# Patient Record
Sex: Female | Born: 1940 | Race: Black or African American | Hispanic: No | Marital: Married | Smoking: Never smoker
Health system: Southern US, Community
[De-identification: ages and names within clinical notes are randomized; demographics above are authoritative.]

## PROBLEM LIST (undated history)

## (undated) DIAGNOSIS — R001 Bradycardia, unspecified: Secondary | ICD-10-CM

## (undated) DIAGNOSIS — I6529 Occlusion and stenosis of unspecified carotid artery: Secondary | ICD-10-CM

## (undated) DIAGNOSIS — T7840XA Allergy, unspecified, initial encounter: Secondary | ICD-10-CM

## (undated) DIAGNOSIS — D649 Anemia, unspecified: Secondary | ICD-10-CM

## (undated) DIAGNOSIS — M797 Fibromyalgia: Secondary | ICD-10-CM

## (undated) DIAGNOSIS — R011 Cardiac murmur, unspecified: Secondary | ICD-10-CM

## (undated) DIAGNOSIS — E119 Type 2 diabetes mellitus without complications: Secondary | ICD-10-CM

## (undated) DIAGNOSIS — F419 Anxiety disorder, unspecified: Secondary | ICD-10-CM

## (undated) DIAGNOSIS — I499 Cardiac arrhythmia, unspecified: Secondary | ICD-10-CM

## (undated) DIAGNOSIS — I8392 Asymptomatic varicose veins of left lower extremity: Secondary | ICD-10-CM

## (undated) DIAGNOSIS — R110 Nausea: Secondary | ICD-10-CM

## (undated) DIAGNOSIS — M549 Dorsalgia, unspecified: Secondary | ICD-10-CM

## (undated) DIAGNOSIS — K224 Dyskinesia of esophagus: Secondary | ICD-10-CM

## (undated) DIAGNOSIS — G8929 Other chronic pain: Secondary | ICD-10-CM

## (undated) DIAGNOSIS — R112 Nausea with vomiting, unspecified: Secondary | ICD-10-CM

## (undated) DIAGNOSIS — Z86010 Personal history of colonic polyps: Secondary | ICD-10-CM

## (undated) DIAGNOSIS — E785 Hyperlipidemia, unspecified: Secondary | ICD-10-CM

## (undated) DIAGNOSIS — K5790 Diverticulosis of intestine, part unspecified, without perforation or abscess without bleeding: Secondary | ICD-10-CM

## (undated) DIAGNOSIS — I10 Essential (primary) hypertension: Secondary | ICD-10-CM

## (undated) DIAGNOSIS — F32A Depression, unspecified: Secondary | ICD-10-CM

## (undated) DIAGNOSIS — G4730 Sleep apnea, unspecified: Secondary | ICD-10-CM

## (undated) DIAGNOSIS — K625 Hemorrhage of anus and rectum: Secondary | ICD-10-CM

## (undated) DIAGNOSIS — Z9889 Other specified postprocedural states: Secondary | ICD-10-CM

## (undated) DIAGNOSIS — D696 Thrombocytopenia, unspecified: Secondary | ICD-10-CM

## (undated) DIAGNOSIS — J45909 Unspecified asthma, uncomplicated: Secondary | ICD-10-CM

## (undated) DIAGNOSIS — R778 Other specified abnormalities of plasma proteins: Secondary | ICD-10-CM

## (undated) DIAGNOSIS — I639 Cerebral infarction, unspecified: Secondary | ICD-10-CM

## (undated) DIAGNOSIS — M1990 Unspecified osteoarthritis, unspecified site: Secondary | ICD-10-CM

## (undated) DIAGNOSIS — K219 Gastro-esophageal reflux disease without esophagitis: Secondary | ICD-10-CM

## (undated) DIAGNOSIS — K6289 Other specified diseases of anus and rectum: Secondary | ICD-10-CM

## (undated) DIAGNOSIS — K449 Diaphragmatic hernia without obstruction or gangrene: Secondary | ICD-10-CM

## (undated) DIAGNOSIS — F329 Major depressive disorder, single episode, unspecified: Secondary | ICD-10-CM

## (undated) HISTORY — DX: Occlusion and stenosis of unspecified carotid artery: I65.29

## (undated) HISTORY — DX: Cerebral infarction, unspecified: I63.9

## (undated) HISTORY — DX: Other chronic pain: G89.29

## (undated) HISTORY — DX: Depression, unspecified: F32.A

## (undated) HISTORY — DX: Cardiac murmur, unspecified: R01.1

## (undated) HISTORY — DX: Other specified abnormalities of plasma proteins: R77.8

## (undated) HISTORY — DX: Hypercalcemia: E83.52

## (undated) HISTORY — PX: KNEE SURGERY: SHX244

## (undated) HISTORY — DX: Dyskinesia of esophagus: K22.4

## (undated) HISTORY — PX: JOINT REPLACEMENT: SHX530

## (undated) HISTORY — DX: Nausea: R11.0

## (undated) HISTORY — PX: TUBAL LIGATION: SHX77

## (undated) HISTORY — DX: Thrombocytopenia, unspecified: D69.6

## (undated) HISTORY — DX: Allergy, unspecified, initial encounter: T78.40XA

## (undated) HISTORY — DX: Gastro-esophageal reflux disease without esophagitis: K21.9

## (undated) HISTORY — DX: Hyperlipidemia, unspecified: E78.5

## (undated) HISTORY — DX: Fibromyalgia: M79.7

## (undated) HISTORY — DX: Hemorrhage of anus and rectum: K62.5

## (undated) HISTORY — PX: EYE SURGERY: SHX253

## (undated) HISTORY — DX: Sleep apnea, unspecified: G47.30

## (undated) HISTORY — DX: Essential (primary) hypertension: I10

## (undated) HISTORY — PX: GLAUCOMA SURGERY: SHX656

## (undated) HISTORY — DX: Other specified diseases of anus and rectum: K62.89

## (undated) HISTORY — DX: Personal history of colonic polyps: Z86.010

## (undated) HISTORY — DX: Major depressive disorder, single episode, unspecified: F32.9

## (undated) HISTORY — DX: Unspecified osteoarthritis, unspecified site: M19.90

## (undated) HISTORY — DX: Anxiety disorder, unspecified: F41.9

## (undated) HISTORY — DX: Diverticulosis of intestine, part unspecified, without perforation or abscess without bleeding: K57.90

## (undated) HISTORY — PX: CATARACT EXTRACTION, BILATERAL: SHX1313

## (undated) HISTORY — DX: Dorsalgia, unspecified: M54.9

## (undated) HISTORY — DX: Diaphragmatic hernia without obstruction or gangrene: K44.9

---

## 1992-03-14 HISTORY — PX: CARDIAC CATHETERIZATION: SHX172

## 1998-06-02 ENCOUNTER — Other Ambulatory Visit: Admission: RE | Admit: 1998-06-02 | Discharge: 1998-06-02 | Payer: Self-pay | Admitting: Obstetrics and Gynecology

## 1998-07-04 ENCOUNTER — Other Ambulatory Visit: Admission: RE | Admit: 1998-07-04 | Discharge: 1998-07-04 | Payer: Self-pay | Admitting: Obstetrics and Gynecology

## 1998-10-10 ENCOUNTER — Ambulatory Visit (HOSPITAL_COMMUNITY): Admission: RE | Admit: 1998-10-10 | Discharge: 1998-10-10 | Payer: Self-pay | Admitting: Gastroenterology

## 1998-10-13 ENCOUNTER — Ambulatory Visit: Admission: RE | Admit: 1998-10-13 | Discharge: 1998-10-13 | Payer: Self-pay | Admitting: Otolaryngology

## 1998-10-15 HISTORY — PX: NISSEN FUNDOPLICATION: SHX2091

## 1998-10-17 ENCOUNTER — Ambulatory Visit (HOSPITAL_COMMUNITY): Admission: RE | Admit: 1998-10-17 | Discharge: 1998-10-17 | Payer: Self-pay | Admitting: Gastroenterology

## 1998-10-21 ENCOUNTER — Other Ambulatory Visit: Admission: RE | Admit: 1998-10-21 | Discharge: 1998-10-21 | Payer: Self-pay | Admitting: Obstetrics and Gynecology

## 1998-12-20 ENCOUNTER — Inpatient Hospital Stay (HOSPITAL_COMMUNITY): Admission: RE | Admit: 1998-12-20 | Discharge: 1998-12-22 | Payer: Self-pay | Admitting: Internal Medicine

## 1999-02-14 ENCOUNTER — Encounter: Payer: Self-pay | Admitting: Surgery

## 1999-02-14 ENCOUNTER — Ambulatory Visit (HOSPITAL_COMMUNITY): Admission: RE | Admit: 1999-02-14 | Discharge: 1999-02-14 | Payer: Self-pay | Admitting: Surgery

## 1999-11-22 ENCOUNTER — Other Ambulatory Visit: Admission: RE | Admit: 1999-11-22 | Discharge: 1999-11-22 | Payer: Self-pay | Admitting: Obstetrics and Gynecology

## 2001-03-14 ENCOUNTER — Other Ambulatory Visit: Admission: RE | Admit: 2001-03-14 | Discharge: 2001-03-14 | Payer: Self-pay | Admitting: Obstetrics and Gynecology

## 2001-07-04 ENCOUNTER — Ambulatory Visit (HOSPITAL_COMMUNITY): Admission: RE | Admit: 2001-07-04 | Discharge: 2001-07-04 | Payer: Self-pay | Admitting: Family Medicine

## 2001-10-15 DIAGNOSIS — K5790 Diverticulosis of intestine, part unspecified, without perforation or abscess without bleeding: Secondary | ICD-10-CM

## 2001-10-15 HISTORY — DX: Diverticulosis of intestine, part unspecified, without perforation or abscess without bleeding: K57.90

## 2002-01-14 ENCOUNTER — Ambulatory Visit (HOSPITAL_COMMUNITY): Admission: RE | Admit: 2002-01-14 | Discharge: 2002-01-14 | Payer: Self-pay | Admitting: Surgery

## 2002-01-14 ENCOUNTER — Encounter: Payer: Self-pay | Admitting: Surgery

## 2002-06-23 ENCOUNTER — Encounter: Payer: Self-pay | Admitting: Gastroenterology

## 2002-06-23 ENCOUNTER — Ambulatory Visit (HOSPITAL_COMMUNITY): Admission: RE | Admit: 2002-06-23 | Discharge: 2002-06-23 | Payer: Self-pay | Admitting: Gastroenterology

## 2002-06-24 ENCOUNTER — Encounter: Payer: Self-pay | Admitting: Nurse Practitioner

## 2002-07-02 ENCOUNTER — Encounter (INDEPENDENT_AMBULATORY_CARE_PROVIDER_SITE_OTHER): Payer: Self-pay

## 2002-07-02 ENCOUNTER — Encounter (INDEPENDENT_AMBULATORY_CARE_PROVIDER_SITE_OTHER): Payer: Self-pay | Admitting: *Deleted

## 2002-07-02 ENCOUNTER — Ambulatory Visit (HOSPITAL_COMMUNITY): Admission: RE | Admit: 2002-07-02 | Discharge: 2002-07-02 | Payer: Self-pay | Admitting: Gastroenterology

## 2002-07-02 DIAGNOSIS — Z860101 Personal history of adenomatous and serrated colon polyps: Secondary | ICD-10-CM

## 2002-07-02 DIAGNOSIS — Z86010 Personal history of colonic polyps: Secondary | ICD-10-CM

## 2002-07-02 HISTORY — DX: Personal history of colonic polyps: Z86.010

## 2002-07-02 HISTORY — DX: Personal history of adenomatous and serrated colon polyps: Z86.0101

## 2002-08-27 ENCOUNTER — Other Ambulatory Visit: Admission: RE | Admit: 2002-08-27 | Discharge: 2002-08-27 | Payer: Self-pay | Admitting: Obstetrics and Gynecology

## 2003-07-03 ENCOUNTER — Encounter: Payer: Self-pay | Admitting: Emergency Medicine

## 2003-07-03 ENCOUNTER — Inpatient Hospital Stay (HOSPITAL_COMMUNITY): Admission: EM | Admit: 2003-07-03 | Discharge: 2003-07-08 | Payer: Self-pay | Admitting: Emergency Medicine

## 2003-07-04 ENCOUNTER — Encounter: Payer: Self-pay | Admitting: Internal Medicine

## 2003-07-05 ENCOUNTER — Encounter (INDEPENDENT_AMBULATORY_CARE_PROVIDER_SITE_OTHER): Payer: Self-pay | Admitting: *Deleted

## 2003-07-07 ENCOUNTER — Encounter (INDEPENDENT_AMBULATORY_CARE_PROVIDER_SITE_OTHER): Payer: Self-pay | Admitting: *Deleted

## 2003-07-07 ENCOUNTER — Encounter: Payer: Self-pay | Admitting: Cardiology

## 2003-07-15 ENCOUNTER — Encounter: Payer: Self-pay | Admitting: Nurse Practitioner

## 2003-07-16 ENCOUNTER — Encounter: Admission: RE | Admit: 2003-07-16 | Discharge: 2003-07-16 | Payer: Self-pay | Admitting: Gastroenterology

## 2003-07-16 ENCOUNTER — Encounter: Payer: Self-pay | Admitting: Gastroenterology

## 2003-08-11 ENCOUNTER — Encounter: Payer: Self-pay | Admitting: Nurse Practitioner

## 2003-08-30 ENCOUNTER — Other Ambulatory Visit: Admission: RE | Admit: 2003-08-30 | Discharge: 2003-08-30 | Payer: Self-pay | Admitting: Obstetrics and Gynecology

## 2003-09-07 ENCOUNTER — Ambulatory Visit (HOSPITAL_COMMUNITY): Admission: RE | Admit: 2003-09-07 | Discharge: 2003-09-07 | Payer: Self-pay | Admitting: Gastroenterology

## 2003-09-07 ENCOUNTER — Encounter (INDEPENDENT_AMBULATORY_CARE_PROVIDER_SITE_OTHER): Payer: Self-pay | Admitting: *Deleted

## 2003-09-16 ENCOUNTER — Encounter: Payer: Self-pay | Admitting: Nurse Practitioner

## 2004-02-07 ENCOUNTER — Encounter: Payer: Self-pay | Admitting: Nurse Practitioner

## 2004-04-11 ENCOUNTER — Encounter: Payer: Self-pay | Admitting: Nurse Practitioner

## 2004-04-14 ENCOUNTER — Encounter: Payer: Self-pay | Admitting: Nurse Practitioner

## 2004-04-14 ENCOUNTER — Encounter: Admission: RE | Admit: 2004-04-14 | Discharge: 2004-04-14 | Payer: Self-pay | Admitting: Gastroenterology

## 2004-05-01 ENCOUNTER — Ambulatory Visit (HOSPITAL_COMMUNITY): Admission: RE | Admit: 2004-05-01 | Discharge: 2004-05-01 | Payer: Self-pay | Admitting: Gastroenterology

## 2004-05-01 ENCOUNTER — Encounter: Payer: Self-pay | Admitting: Nurse Practitioner

## 2004-06-05 ENCOUNTER — Encounter: Admission: RE | Admit: 2004-06-05 | Discharge: 2004-06-05 | Payer: Self-pay | Admitting: Gastroenterology

## 2004-06-05 ENCOUNTER — Encounter: Payer: Self-pay | Admitting: Nurse Practitioner

## 2005-01-16 ENCOUNTER — Encounter: Payer: Self-pay | Admitting: Emergency Medicine

## 2005-01-16 ENCOUNTER — Encounter (INDEPENDENT_AMBULATORY_CARE_PROVIDER_SITE_OTHER): Payer: Self-pay | Admitting: Cardiology

## 2005-01-16 ENCOUNTER — Inpatient Hospital Stay (HOSPITAL_COMMUNITY): Admission: AD | Admit: 2005-01-16 | Discharge: 2005-01-20 | Payer: Self-pay | Admitting: Internal Medicine

## 2005-01-17 ENCOUNTER — Encounter (INDEPENDENT_AMBULATORY_CARE_PROVIDER_SITE_OTHER): Payer: Self-pay | Admitting: *Deleted

## 2005-01-19 ENCOUNTER — Encounter (INDEPENDENT_AMBULATORY_CARE_PROVIDER_SITE_OTHER): Payer: Self-pay | Admitting: *Deleted

## 2006-06-02 ENCOUNTER — Emergency Department (HOSPITAL_COMMUNITY): Admission: EM | Admit: 2006-06-02 | Discharge: 2006-06-02 | Payer: Self-pay | Admitting: Emergency Medicine

## 2006-08-14 ENCOUNTER — Inpatient Hospital Stay (HOSPITAL_COMMUNITY): Admission: EM | Admit: 2006-08-14 | Discharge: 2006-08-17 | Payer: Self-pay | Admitting: Emergency Medicine

## 2006-08-14 ENCOUNTER — Encounter: Payer: Self-pay | Admitting: Cardiovascular Disease

## 2006-08-14 ENCOUNTER — Ambulatory Visit: Payer: Self-pay | Admitting: Cardiovascular Disease

## 2007-03-31 HISTORY — PX: OTHER SURGICAL HISTORY: SHX169

## 2007-10-16 HISTORY — PX: GASTRIC RESECTION: SHX5248

## 2008-05-05 ENCOUNTER — Encounter (INDEPENDENT_AMBULATORY_CARE_PROVIDER_SITE_OTHER): Payer: Self-pay | Admitting: *Deleted

## 2008-05-05 ENCOUNTER — Encounter: Admission: RE | Admit: 2008-05-05 | Discharge: 2008-05-05 | Payer: Self-pay | Admitting: Surgery

## 2008-06-07 ENCOUNTER — Ambulatory Visit (HOSPITAL_COMMUNITY): Admission: RE | Admit: 2008-06-07 | Discharge: 2008-06-07 | Payer: Self-pay | Admitting: Surgery

## 2008-06-07 ENCOUNTER — Encounter (INDEPENDENT_AMBULATORY_CARE_PROVIDER_SITE_OTHER): Payer: Self-pay | Admitting: *Deleted

## 2008-08-20 ENCOUNTER — Encounter (INDEPENDENT_AMBULATORY_CARE_PROVIDER_SITE_OTHER): Payer: Self-pay | Admitting: *Deleted

## 2008-08-20 ENCOUNTER — Inpatient Hospital Stay (HOSPITAL_COMMUNITY): Admission: RE | Admit: 2008-08-20 | Discharge: 2008-08-25 | Payer: Self-pay | Admitting: Surgery

## 2008-08-20 ENCOUNTER — Encounter (INDEPENDENT_AMBULATORY_CARE_PROVIDER_SITE_OTHER): Payer: Self-pay | Admitting: Surgery

## 2008-08-21 ENCOUNTER — Encounter (INDEPENDENT_AMBULATORY_CARE_PROVIDER_SITE_OTHER): Payer: Self-pay | Admitting: *Deleted

## 2008-12-27 ENCOUNTER — Encounter: Admission: RE | Admit: 2008-12-27 | Discharge: 2008-12-27 | Payer: Self-pay | Admitting: Family Medicine

## 2009-02-22 ENCOUNTER — Encounter: Payer: Self-pay | Admitting: Nurse Practitioner

## 2009-02-22 ENCOUNTER — Encounter: Payer: Self-pay | Admitting: Internal Medicine

## 2009-05-27 ENCOUNTER — Encounter: Payer: Self-pay | Admitting: Internal Medicine

## 2009-06-22 ENCOUNTER — Encounter: Payer: Self-pay | Admitting: Nurse Practitioner

## 2009-06-23 ENCOUNTER — Ambulatory Visit (HOSPITAL_COMMUNITY): Admission: RE | Admit: 2009-06-23 | Discharge: 2009-06-23 | Payer: Self-pay | Admitting: *Deleted

## 2009-07-11 ENCOUNTER — Encounter: Payer: Self-pay | Admitting: Internal Medicine

## 2009-08-07 ENCOUNTER — Emergency Department (HOSPITAL_COMMUNITY): Admission: EM | Admit: 2009-08-07 | Discharge: 2009-08-07 | Payer: Self-pay | Admitting: Emergency Medicine

## 2009-08-25 ENCOUNTER — Encounter: Payer: Self-pay | Admitting: Nurse Practitioner

## 2009-09-05 ENCOUNTER — Encounter: Payer: Self-pay | Admitting: Nurse Practitioner

## 2009-09-29 ENCOUNTER — Ambulatory Visit: Payer: Self-pay | Admitting: Internal Medicine

## 2009-09-29 DIAGNOSIS — R1319 Other dysphagia: Secondary | ICD-10-CM

## 2009-09-29 DIAGNOSIS — K210 Gastro-esophageal reflux disease with esophagitis: Secondary | ICD-10-CM

## 2009-09-29 DIAGNOSIS — F32A Depression, unspecified: Secondary | ICD-10-CM | POA: Insufficient documentation

## 2009-09-29 DIAGNOSIS — F329 Major depressive disorder, single episode, unspecified: Secondary | ICD-10-CM | POA: Insufficient documentation

## 2009-09-29 DIAGNOSIS — K5900 Constipation, unspecified: Secondary | ICD-10-CM

## 2009-09-29 DIAGNOSIS — K219 Gastro-esophageal reflux disease without esophagitis: Secondary | ICD-10-CM | POA: Insufficient documentation

## 2009-10-03 ENCOUNTER — Ambulatory Visit (HOSPITAL_COMMUNITY): Admission: RE | Admit: 2009-10-03 | Discharge: 2009-10-03 | Payer: Self-pay | Admitting: Hematology & Oncology

## 2009-10-04 ENCOUNTER — Encounter: Payer: Self-pay | Admitting: Internal Medicine

## 2009-10-04 ENCOUNTER — Telehealth: Payer: Self-pay | Admitting: Internal Medicine

## 2009-10-04 DIAGNOSIS — K449 Diaphragmatic hernia without obstruction or gangrene: Secondary | ICD-10-CM

## 2009-10-28 ENCOUNTER — Encounter (INDEPENDENT_AMBULATORY_CARE_PROVIDER_SITE_OTHER): Payer: Self-pay | Admitting: *Deleted

## 2009-11-01 ENCOUNTER — Ambulatory Visit: Payer: Self-pay | Admitting: Internal Medicine

## 2009-11-08 ENCOUNTER — Ambulatory Visit: Payer: Self-pay | Admitting: Internal Medicine

## 2009-11-08 DIAGNOSIS — K449 Diaphragmatic hernia without obstruction or gangrene: Secondary | ICD-10-CM

## 2009-11-08 HISTORY — DX: Diaphragmatic hernia without obstruction or gangrene: K44.9

## 2009-11-10 ENCOUNTER — Encounter: Payer: Self-pay | Admitting: Internal Medicine

## 2009-11-21 ENCOUNTER — Encounter: Payer: Self-pay | Admitting: Internal Medicine

## 2009-12-14 DIAGNOSIS — M2550 Pain in unspecified joint: Secondary | ICD-10-CM | POA: Insufficient documentation

## 2009-12-14 DIAGNOSIS — M549 Dorsalgia, unspecified: Secondary | ICD-10-CM | POA: Insufficient documentation

## 2009-12-14 DIAGNOSIS — Z860100 Personal history of colon polyps, unspecified: Secondary | ICD-10-CM | POA: Insufficient documentation

## 2009-12-14 DIAGNOSIS — G4730 Sleep apnea, unspecified: Secondary | ICD-10-CM | POA: Insufficient documentation

## 2009-12-14 DIAGNOSIS — E785 Hyperlipidemia, unspecified: Secondary | ICD-10-CM | POA: Insufficient documentation

## 2009-12-14 DIAGNOSIS — K644 Residual hemorrhoidal skin tags: Secondary | ICD-10-CM | POA: Insufficient documentation

## 2009-12-14 DIAGNOSIS — Z8719 Personal history of other diseases of the digestive system: Secondary | ICD-10-CM

## 2009-12-14 DIAGNOSIS — K5730 Diverticulosis of large intestine without perforation or abscess without bleeding: Secondary | ICD-10-CM | POA: Insufficient documentation

## 2009-12-14 DIAGNOSIS — K648 Other hemorrhoids: Secondary | ICD-10-CM

## 2009-12-14 DIAGNOSIS — M810 Age-related osteoporosis without current pathological fracture: Secondary | ICD-10-CM | POA: Insufficient documentation

## 2009-12-14 DIAGNOSIS — IMO0001 Reserved for inherently not codable concepts without codable children: Secondary | ICD-10-CM

## 2009-12-19 ENCOUNTER — Ambulatory Visit: Payer: Self-pay | Admitting: Internal Medicine

## 2009-12-22 ENCOUNTER — Encounter: Payer: Self-pay | Admitting: Internal Medicine

## 2009-12-27 ENCOUNTER — Ambulatory Visit (HOSPITAL_COMMUNITY): Admission: RE | Admit: 2009-12-27 | Discharge: 2009-12-27 | Payer: Self-pay | Admitting: Internal Medicine

## 2010-03-16 ENCOUNTER — Telehealth: Payer: Self-pay | Admitting: Internal Medicine

## 2010-07-31 ENCOUNTER — Telehealth: Payer: Self-pay | Admitting: Internal Medicine

## 2010-08-02 ENCOUNTER — Encounter (HOSPITAL_COMMUNITY)
Admission: RE | Admit: 2010-08-02 | Discharge: 2010-10-17 | Payer: Self-pay | Source: Home / Self Care | Attending: Family Medicine | Admitting: Family Medicine

## 2010-11-05 ENCOUNTER — Encounter: Payer: Self-pay | Admitting: Family Medicine

## 2010-11-16 NOTE — Progress Notes (Signed)
Summary: samples  Phone Note Call from Patient Call back at [phone]   Caller: Patient Call For: Dr. Juanda Chance Reason for Call: Talk to Nurse Summary of Call: would like Dexilant samples Initial call taken by: Vallarie Mare,  July 31, 2010 2:44 PM  Follow-up for Phone Call        Advised patient that I am unable to give her Dexilant samples as she has not been following up with Dr Ermalene Searing office (cancelled appointment in August and still hasnt rescheduled). Patient advised to follow up with Dr Norva Riffle office and take omeprazole prescription o.t.c. until she has follow up. Follow-up by: Lamona Curl CMA Duncan Dull),  July 31, 2010 4:08 PM

## 2010-11-16 NOTE — Miscellaneous (Signed)
Summary: [facility] Previsit/prep  Clinical Lists Changes  Observations: Added new observation of ALLERGY REV: Done (11/01/2009 10:53)

## 2010-11-16 NOTE — Assessment & Plan Note (Signed)
Summary: F/U FROM ENDOSCOPY               [name]   History of Present Illness Visit Type: Follow-up Visit Primary GI MD: Cheryl Sar MD Primary Provider: Ace Gins, MD Chief Complaint: follow up from EGD, pt states she had coffee ground emesis on Friday.  She has not had any since then.  Pt will still have reflux on Dexilant History of Present Illness:   This is a 70 year old African American female with severe gastroesophageal reflux refractory to medical therapy. She underwent a Nissen fundoplication in 2001 and was well for 2 years. She started having recurrent symptoms of reflux in 2003. She underwent a takedown of adhesions by Dr. Daphine Bates in 2009. An upper endoscopy in January 2011 showed a Nissen fundoplication, gastroesophageal reflux and a 3-4 cm hiatal hernia with fundic gland polyps. An upper GI series in December 2010 showed a moderate size hiatal hernia. There were normal esophageal motility changes of Nissen fundoplication and free gastroesophageal reflux. She is noted to have a good control with the proton pump inhibitor. In addition, her insurance does not cover most of the PPIs. She has been on samples of Dexilant 60 mg twice a day and Carafate slurry. She wakes up at night with food and acid regurgitation. She sleeps in a recliner. She vomited coffee ground material several days ago.   GI Review of Systems    Reports vomiting.      Denies abdominal pain, acid reflux, belching, bloating, chest pain, dysphagia with liquids, dysphagia with solids, heartburn, loss of appetite, nausea, vomiting blood, weight loss, and  weight gain.        Denies anal fissure, black tarry stools, change in bowel habit, constipation, diarrhea, diverticulosis, fecal incontinence, heme positive stool, hemorrhoids, irritable bowel syndrome, jaundice, light color stool, liver problems, rectal bleeding, and  rectal pain.    Current Medications (verified): 1)  Dexilant 60 Mg Cpdr (Dexlansoprazole) ....  Take One Two Times A Day 2)  Vicodin 5-500 Mg Tabs (Hydrocodone-Acetaminophen) .... Take 1 Tablet Every 6 Hours As Needed For Pain 3)  Ambien 10 Mg Tabs (Zolpidem Tartrate) .... At Bedtime 4)  Prozac 20 Mg Caps (Fluoxetine Hcl) .... Prn 5)  Advil 200 Mg Tabs (Ibuprofen) .... As Needed 6)  Osteo Bi-Flex Regular Strength 250-200 Mg Tabs (Glucosamine-Chondroitin) .... Once Daily 7)  Multivitamins  Tabs (Multiple Vitamin) .... Once Daily 8)  Viatamin D2 1.25mg  -50000iu .... Take Twice Weekly 9)  Xtra Cal .... Once Daily  Allergies (verified): 1)  ! Demerol 2)  ! Naprosyn 3)  ! * Delacor  Past History:  Past Medical History: Reviewed history from 09/29/2009 and no changes required. Arthritis Depression Fibromyalgia GERD Glaucoma Hyperlipidemia Sleep Apnea  Past Surgical History: Reviewed history from 12/14/2009 and no changes required. Nissen Fundoplication 2000 Nissen Takedown / gastric resection 2009  Family History: Reviewed history from 12/14/2009 and no changes required. No FH of Colon Cancer: Family History of Diabetes:Parents  Family History of Heart Disease: Mother Family History of Breast Cancer: Daughter  Social History: Reviewed history from 09/29/2009 and no changes required. Occupation: Retired Patient has never smoked.  Alcohol Use - no Illicit Drug Use - no  Review of Systems  The patient denies allergy/sinus, anemia, anxiety-new, arthritis/joint pain, back pain, blood in urine, breast changes/lumps, confusion, cough, coughing up blood, depression-new, fainting, fatigue, fever, headaches-new, hearing problems, heart murmur, heart rhythm changes, itching, menstrual pain, muscle pains/cramps, night sweats, nosebleeds, pregnancy symptoms, shortness of breath,  skin rash, sleeping problems, sore throat, swelling of feet/legs, swollen lymph glands, thirst - excessive, urination - excessive, urination changes/pain, urine leakage, vision changes, and voice change.           Pertinent positive and negative review of systems were noted in the above HPI. All other ROS was otherwise negative.   Vital Signs:  Patient profile:   70 year old female Height:      70 inches Weight:      184 pounds BMI:     26.50 Pulse rate:   60 / minute Pulse rhythm:   regular BP sitting:   122 / 80  (left arm) Cuff size:   regular  Vitals Entered By: [name] CMA [name]) (December 19, 2009 3:44 PM)   Impression & Recommendations:  Problem # 1:  REFLUX ESOPHAGITIS (ICD-530.11) Patient has severe gastroesophageal reflux disease refractory to medical therapy. She is status post remote Nissen fundoplication with ongoing gastroesophageal reflux documented on an upper GI series. Her medical therapy has been suboptimal. She has normal esophageal motility. We will proceed with a gastric emptying scan to rule out gastroparesis. I would like to have another opinion from Dr. Daphine Bates about the possibility of repeating her Nissen fundoplication or possibly using a different surgical technique to proceed with the antireflux procedure. She is up-to-date on her colonoscopy which was last done in 2003.  Problem # 2:  GASTRIC POLYP, HX OF (ICD-V12.79) Patient had fundic gland polyps as per her last endoscopy.  Other Orders: [facility] ([facility]) Gastric Emptying Scan (GES)   Patient Instructions: 1)  strict antireflux measures. 2)  Gastric emptying scan. 3)  Samples of Dexilant 60 mg by mouth two times a day . 4)  Office visit with Dr. Daphine Bates for consideration of Nissen fundoplication redo. 5)  Copy sent to : Dr [name], Dr [name] 6)  The medication list was reviewed and reconciled.  All changed / newly prescribed medications were explained.  A complete medication list was provided to the patient / caregiver.

## 2010-11-16 NOTE — Op Note (Signed)
Summary: Laparoscopic Takedown of Nissen (Dr Daphine Deutscher)  NAME:  [name], [name]                   ACCOUNT NO.:  [id number]      MEDICAL RECORD NO.:  [id number]          PATIENT TYPE:  INP      LOCATION:  1526                         FACILITY:  [facility]      PHYSICIAN:  Thornton Park. Daphine Deutscher, MD  DATE OF BIRTH:  1941-03-14      DATE OF PROCEDURE:  08/20/2008   DATE OF DISCHARGE:                                  OPERATIVE REPORT      PREOPERATIVE DIAGNOSIS:  Recurrent hiatal hernia with persistent   symptoms of significant gastroesophageal reflux.      POSTOPERATIVE DIAGNOSIS:  Intact Nissen wrap with herniation of stomach   above wrap into chest.      PROCEDURE:  Laparoscopic takedown of Nissen and the herniated stomach,   upper endoscopy by Dr. Colin Benton and Dr. Abbey Chatters and myself, resection of   a portion of the cardia that created the previous wrap and hiatus   closure.      OPERATIVE TIME:  Five hours.      SURGEON:  Thornton Park. Daphine Deutscher, MD      ASSISTANT:  Adolph Pollack, MD.      FINDINGS:  Very stuck and distorted anatomy with an intact Nissen wrap   but herniated stomach above that and marked inflammatory reaction.      DESCRIPTION OF PROCEDURE:  [name] was taken to OR #1 on Friday,   August 20, 2008, and given general anesthesia.  The abdomen was prepped   with ChloraPrep and draped sterilely.  Her upper GI series was brought   up on the intraoperative monitoring device and her previous operative   note was examined.  [name] had undergone a laparoscopic Nissen   fundoplication in March of 2000 by me and by Dr. Abbey Chatters for   refractory esophageal reflux.  At that time she had an accessory hepatic   vessel noted and a lot of significant distal esophagitis inflammatory   reaction.      The abdomen was entered using an OptiView through the left upper   quadrant without difficulty.  The abdomen was insufflated.  A 5 mm was   placed in the upper abdomen for the [facility] and  then standard two 11's   on the right, the medial one of which was subsequently changed out to a   12 later in the case and another 5 was placed laterally and another 11   below for the camera.      Dissection began by taking the wrapped portion down from the liver.   This was a tedious dissection done with scissors and with a Harmonic but   was ultimately accomplished.  We were able to discern the knots from the   three tied knots from the Nissen wrap that also had clips on them.  We   spent a total of 5 hours taking down the anatomy which was at times very   obscured by the inflammatory reaction and scar.  We then came upon the  accessory hepatic vessel and preserved that and worked back and forth,   finally putting a Penrose drain above for traction.  With that in place   we were able then to take down the wrapped portion of the stomach which   was finally found to be really stuck and plastered to the crura   posteriorly on the right.  I ended up taking that down with the Harmonic   scalpel and creating a little gastrotomy there which and also it was a   pretty well beaten up portion of the stomach where it had not only been   wrapped but where it was stuck to the diaphragm.  Ultimately when all   was said and done, we oriented that portion of the cardia and resected   it with three firings of the Endo GIA 6 cm stapler.  In the middle of   all this we endoscoped her, having a little difficulty getting through   her cricopharyngeal region but did not see any obvious injury but would   look more on the look out for when we came out.  There was no evidence   of any injury to our inspection.  However, we did get down and we were   able to look at the stomach and identify the landmarks and we left the   endoscope in place through the case and the esophagus with light turned   off and at the very end we submerged everything and insufflated and did   not see any bubbles or evidence of any  leaks.  This resected portion of   the cardia was put in a bag and brought out through the 12 mm port.  The   crura were closed posteriorly with a single suture.  I did not use   pledgets since there had been that gastrotomy and I did not want to   contaminate the felt.  There was basically no spillage from that,   however.  In the end I felt that her symptomatology was largely because   she had a very snug wrap that she had herniated above and was trapping   gastric mucosa above that.  After the completion of the case everything   looked to be in order.  The abdomen was deflated and wounds were closed   with 4-0 Vicryl.  The patient was taken to the recovery room in   satisfactory condition.               Thornton Park Daphine Deutscher, MD   Electronically Signed            MBM/MEDQ  D:  08/20/2008  T:  08/21/2008  Job:  [id number]      cc:   Evelena Peat, M.D.      Bernette Redbird, M.D.   Fax: [phone]

## 2010-11-16 NOTE — Progress Notes (Signed)
Summary: samples  Phone Note Call from Patient Call back at Home Phone [phone]   Caller: Patient Call For: Deanette Tullius Reason for Call: Talk to Nurse Summary of Call: Patient would like Dexilant samples Initial call taken by: Tawni Levy,  March 16, 2010 2:42 PM  Follow-up for Phone Call        I have spoken to patient to make sure she has been following up with Dr Daphine Deutscher (as was requested in his office note cc'ed to [place] in March 2011). She did see him 1 month ago and was told that she is likely to need surgery again. She states that she has another follow up with him in July, so I have agreed to give her enough dexilant to get her through until [facility] July (8 boxes). Follow-up by: Lamona Curl CMA Duncan Dull),  March 16, 2010 2:55 PM    New/Updated Medications: DEXILANT 60 MG CPDR (DEXLANSOPRAZOLE) Take one two times a day

## 2010-11-16 NOTE — Op Note (Signed)
Summary: 24 Hour PH Probe (Dr Matthias Hughs)  NAME:  Cheryl, Bates                             ACCOUNT NO.:  [id number]   MEDICAL RECORD NO.:  [id number]                   PATIENT TYPE:  AMB   LOCATION:  ENDO                                 FACILITY:  [facility]   PHYSICIAN:  Bernette Redbird, M.D.                DATE OF BIRTH:  September 11, 1941   DATE OF PROCEDURE:  09/08/2003  DATE OF DISCHARGE:  09/07/2003                                 OPERATIVE REPORT   PROCEDURE PERFORMED:  24 hour pH monitoring.   INDICATIONS FOR PROCEDURE:  Reflux symptoms despite Prevacid therapy in a  patient status post previous antireflux surgery.   FINDINGS:  Essentially normal study.   DESCRIPTION OF PROCEDURE:  The patient provided written consent for the  procedure.  Following performance of esophageal manometry, transnasal  passage of a pH probe was accomplished by the endoscopy nurse as an  outpatient at [facility] and the patient went home to return the  next day after approximately 24 hours of monitoring, after which time, the  data was downloaded.   FINDINGS:  1. Proximal esophagus.  Total reflux time was 0.4% with no episodes lasting     greater than five minutes and the longest episode lasting two minutes.     All of the reflux occurred while in the upright position.  2. Distal esophagus.  The distal esophagus had 1.9% total time in reflux,     predominantly in the upright posture with no episodes lasting longer than     five minutes.  The DeMeester score was 14, with normal being less than     22.   IMPRESSION:  I would consider this a normal study.  There are not norms for  the degree to which acid exposure in the proximal esophagus is considered  pathologic and some individuals would say that any reflux in the proximal  esophageal probe is abnormal.  Nonetheless, I would favor the interpretation  of this as being a study basically within normal limits, with the proviso  that it was performed  while on medications.  It would support the idea that  the patient's chest pain symptoms are not coming from significant reflux  episodes, although since this study only reflects a single 24 hour period,  it is not possible to deny the occurrence of significant reflux with  associated chest pain at other times.  The patient's diary did list several  episodes of chest pain, none of which correlated with episodes of reflux, so  it is felt likely that the patient's chest pain is not on a reflux basis.   RECOMMENDATIONS:  For now continue proton pump inhibitor therapy and  clinical follow-up of the patient in the office would be appropriate.  Bernette Redbird, M.D.    RB/MEDQ  D:  11/17/2003  T:  11/18/2003  Job:  [id number]

## 2010-11-16 NOTE — Letter (Signed)
Summary: EGD Instructions  [facility]  [address] [place], [place] [address]   Phone: [phone]  Fax: [phone]       [name]    [birth year]    MRN: [id number]       Procedure Day /Date:  11/08/09  Tuesday     Arrival Time:  2pm     Procedure Time: 3pm     Location of Procedure:                    _ x_ [facility] (4th Floor)   PREPARATION FOR ENDOSCOPY   On_ 11/08/09    Tuesday   THE DAY OF THE PROCEDURE:  1.   No solid foods, milk or milk products are allowed after midnight the night before your procedure.  2.   Do not drink anything colored red or purple.  Avoid juices with pulp.  No orange juice.  3.  You may drink clear liquids until 1pm , which is 2 hours before your procedure.                                                                                                CLEAR LIQUIDS INCLUDE: Water Jello Ice Popsicles Tea (sugar ok, no milk/cream) Powdered fruit flavored drinks Coffee (sugar ok, no milk/cream) Gatorade Juice: apple, white grape, white cranberry  Lemonade Clear bullion, consomm, broth Carbonated beverages (any kind) Strained chicken noodle soup Hard Candy   MEDICATION INSTRUCTIONS  Unless otherwise instructed, you should take regular prescription medications with a small sip of water as early as possible the morning of your procedure.             OTHER INSTRUCTIONS  You will need a responsible adult at least 69 years of age to accompany you and drive you home.   This person must remain in the waiting room during your procedure.  Wear loose fitting clothing that is easily removed.  Leave jewelry and other valuables at home.  However, you may wish to bring a book to read or an iPod/MP3 player to listen to music as you wait for your procedure to start.  Remove all body piercing jewelry and leave at home.  Total time from sign-in until discharge is approximately 2-3 hours.  You should go home  directly after your procedure and rest.  You can resume normal activities the day after your procedure.  The day of your procedure you should not:   Drive   Make legal decisions   Operate machinery   Drink alcohol   Return to work  You will receive specific instructions about eating, activities and medications before you leave.    The above instructions have been reviewed and explained to me by   Wyona Almas RN  November 01, 2009 11:29 AM     I fully understand and can verbalize these instructions _____________________________ Date _________

## 2010-11-16 NOTE — Discharge Summary (Signed)
Summary: Chest Pain    NAME:  [name], [name]                             ACCOUNT NO.:  [id number]   MEDICAL RECORD NO.:  [id number]                   PATIENT TYPE:  INP   LOCATION:  1610                                 FACILITY:  [facility]   PHYSICIAN:  Sherin Quarry, MD                   DATE OF BIRTH:  [birth year]   DATE OF ADMISSION:  07/03/2003  DATE OF DISCHARGE:                                 DISCHARGE SUMMARY   [name] is a 70 year old lady with a past history that is remarkable for  chronic gastroesophageal reflux and previous Nissen fundoplication.  On the  afternoon prior to her presentation to the emergency room she experienced a  severe episode of substernal chest pain which seemed to radiate to the base  of her neck.  This was associated with pain in the left arm area which then  persisted for several hours.  There was associated nausea.  The patient  indicated that she had had several episodes of acid reflux since the Nissen  procedure.   Physical exam at the time of admission is described by Dr. Efraim Kaufmann L.  Ladona Ridgel.  The temperature was 98, blood pressure 139/67, pulse was 66,  respirations were 20, O2 saturation was 99%.  HEENT exam was within normal  limits.  The chest was clear.  Cardiovascular exam revealed normal S1 and  S2.  There are no rubs, murmurs, or gallops.  The abdomen was benign.  Minimal bowel sounds.  Without masses, tenderness, or organomegaly.  Neurological testing and examination of the extremities were normal.   Relevant laboratory studies obtained included CBC which revealed a white  count of 4000, hemoglobin was 13.1.  PT and PTT were within normal limits.  The d-dimer was slightly elevated at 0.74.  CMET was remarkable for  potassium of 3.7, a glucose of 113.  The liver profile was within normal  limits.  Serial cardiac enzymes were negative.  Of note was that the  cholesterol level was 312 with the LDL cholesterol 215.   In light of the positive  d-dimer result, a spiral CT scan of the chest was  done which showed no CT scan evidence of pulmonary emboli.  A chest x-ray  was within normal limits.   Echocardiogram was done, which showed a left ventricular ejection fraction  of 55-65% with no evidence of left ventricular hypertrophy.   Consultation was obtained from Dr. Shana Chute of the cardiology service, who  recommended that the patient undergo a Persantine Cardiolite study.  This  was done on July 07, 2003.  I discussed the results of the test with  nuclear medicine doctors at [facility], and the verbal report was that  the study was completely normal.  Therefore, on July 07, 2003, the  patient was discharged.   DISCHARGE DIAGNOSES:  1. Chest pain, noncardiac, possibly secondary to  gastroesophageal reflux.  2. History of gastroesophageal reflux, status post Nissen fundoplication.  3. Hyperlipidemia.  4. Transient hypertension.   DISCHARGE MEDICATIONS:  1. The patient will continue Prevacid in a dose of 30 mg b.i.d.  2. She will also take Zocor 40 mg daily at bedtime.  3. Aspirin 325 mg daily.   I encouraged her to follow up with Dr. Bayard Beaver. Spear at [facility], who is her primary care doctor according to the patient.   CONDITION AT THE TIME DISCHARGE:  Good.                                               Sherin Quarry, MD    SY/MEDQ  D:  07/07/2003  T:  07/08/2003  Job:  [id number]   cc:   Tammy R. Collins [place], M.D.  [address]  [place]  [place] [address]  Fax: [phone]   Osvaldo Shipper. Spruill, M.D.  [address] [address]  [place]  [place] [address]  Fax: [phone]

## 2010-11-16 NOTE — Letter (Signed)
Summary: [facility] Medical Biopsy Results  [facility]  [address] [place], [place] [address]   Phone: [phone]  Fax: [phone]        November 10, 2009 MRN: [id number]    [facility] [address] [place], [place]  [address]    Dear [name],  I am pleased to inform you that the biopsies taken during your recent endoscopic examination did not show any evidence of cancer upon pathologic examination.The polyps in Your stomach were benign.  Additional information/recommendations:  __No further action is needed at this time.  Please follow-up with      your primary care physician for your other healthcare needs.  __ Please call [phone] to schedule a return visit to review      your condition. x __ Continue with the treatment plan as outlined on the day of your      exam.  _   Please call us if you are having persistent problems or have questions about your condition that have not been fully answered at this time.  Sincerely,  Hart Carwin MD  This letter has been electronically signed by your physician.  Appended Document: Patient Notice-Endo Biopsy Results Letter mailed 1.28.11

## 2010-11-16 NOTE — Op Note (Signed)
Summary: EGD (Dr Ezzard Standing)  NAME:  [name], [name]                   ACCOUNT NO.:  [id number]      MEDICAL RECORD NO.:  [id number]          PATIENT TYPE:  AMB      LOCATION:  ENDO                         FACILITY:  [facility]      PHYSICIAN:  Sandria Bales. Ezzard Standing, M.D.  DATE OF BIRTH:  11/24/1940      DATE OF PROCEDURE:  06/07/2008   DATE OF DISCHARGE:                                  OPERATIVE REPORT      Date of surgery ??      PREOPERATIVE DIAGNOSIS:  History of Nissen fundoplication with   questionable ulcer, gastric ulcer.      POSTOPERATIVE DIAGNOSIS:  Esophagogastric junction at 35 centimeters.   There is a 3 to 4 centimeter hiatal hernia versus stomach above failed   fundoplication wrap, no evidence of  gastric ulcer, multiple benign-   appearing gastric polyps.      OPERATION PERFORMED:  Esophagogastroduodenoscopy.      SURGEON:  Ovidio Kin, MD.      FIRST ASSISTANT:  None.      ANESTHESIA:  Fentanyl 50 mcg, Versed 3 mg.      COMPLICATIONS:  None.      INDICATIONS FOR PROCEDURE:  Ms. Vu is a 70 year old black female who   is a patient of Dr. Wenda Low, who underwent a laparoscopic Nissen   fundoplication on December 19, 1998.  She did well for about 4 or 5 years   but about 4 or 5 years ago developed increasing epigastric pain with   nausea and reflux.  She last underwent endoscopy by Dr. Matthias Hughs in May   2006.  He did note benign gastric polyps at that time and biopsied these   and they proved benign on pathology.      She has had a recent upper GI dated the 22nd of July, 2009, which shows   a questionable gastric ulcer in the region of the fundoplication and   multiple episodes of gastroesophageal reflux noted.  I am doing   endoscopy 1) To check to see if she has an gastric ulcer, 2) To evaluate   her prior Nissen fundoplication.      The indication and potential complications were explained to the   patient.  Potential complications include perforation, bleeding.      OPERATIVE NOTE:   Patient placed in the left lateral decubitus position after the back of   her throat was anesthetized with Cetacaine.  She was placed on 2L of   nasal O2, monitored with pulse oximetry, blood pressure cuff and EKG.      With the patient in the left lateral decubitus position, a flexible   Pentax endoscope was passed without difficulty down the back of her   throat into her stomach.  I was able to advance the scope into the   duodenum.      I saw the first and second portion of duodenums were unremarkable, the   pylorus was unremarkable.  Pulling the scope back within the stomach,   the  patient had multiple gastric polyps, probably at least 20-30 of   these.  These were all benign, flat appearing and they have been   previously biopsy by Dr. Matthias Hughs so I did not rebiopsy these at this   time and none had any suspicious appearance to them.  I then retroflexed   the scope.  The patient's wrap was fairly loose and I could actually   pull the retroflex scope up within above the wrap and I visualized the Z-   line.  I then straightened the scope out, identified the Z-line about 35   cm.  It was hard to tell whether I was looking at the hiatus or whether   it was part of the wrap.  I am guessing the defect is probably part of   the wrap, it is about 3 to 4 cm below the Z-line.  I saw no evidence of   gastric ulcer, I am guessing what was interpreted as a gastric ulcer   were some folds from the prior fundoplication.  She probably has at   least a 2-3 cm hiatal hernia, but again it is hard to tell from where   the wrap fails to her gastroesophageal junction.      Esophagus was unremarkable.  She tolerated procedure well and will be in   touch with Dr. Daphine Deutscher in 2-4 weeks for followup discussion whether she   needs to have this wrap resolved.      She is having significant symptoms of reflux and pain and discomfort.   Her daughter was at the bedside, I gave them copies of the  photos.               Sandria Bales. Ezzard Standing, M.D.   Electronically Signed            DHN/MEDQ  D:  06/07/2008  T:  06/07/2008  Job:  [id number]      cc:   [place] Daphine Deutscher, MD   [address]   [place]   [place] [address]

## 2010-11-16 NOTE — Procedures (Signed)
Summary: Upper Endoscopy  Patient: [name] Note: All result statuses are Final unless otherwise noted.  Tests: (1) Upper Endoscopy (EGD)   EGD Upper Endoscopy       DONE     [facility]     [address]     [place], [place]  [address]           ENDOSCOPY PROCEDURE REPORT           PATIENT:  [name], [name]  MR#:  [id number]     BIRTHDATE:  [birth year], 68 yrs. old  GENDER:  female           ENDOSCOPIST:  Hedwig Morton. Juanda Chance, MD     Referred by:  Ace Gins, M.D.           PROCEDURE DATE:  11/08/2009     PROCEDURE:  EGD with biopsy     ASA CLASS:  Class I     INDICATIONS:  GERD, heartburn s/p Nissen Fundoplication 2000     failed in 2003, s/p takedown in 2009, now intratable reflux           MEDICATIONS:   Versed 7 mg, Fentanyl 50 mcg     TOPICAL ANESTHETIC:  Exactacain Spray           DESCRIPTION OF PROCEDURE:   After the risks benefits and     alternatives of the procedure were thoroughly explained, informed     consent was obtained.  The LB [id number]-H180 [id number] endoscope was     introduced through the mouth and advanced to the descending     duodenum, without limitations.  The instrument was slowly     withdrawn as the mucosa was fully examined.     <<PROCEDUREIMAGES>>           A hiatal hernia was found. 3-4 cm nonreducible hiatal hernia with     slightly angulated lumen, no stricture or esophagitis  There were     multiple polyps identified. fundic gland polyps Multiple biopsies     were obtained and sent to pathology (see image2, image3, image1,     image10, image9, image8, and image7).  Otherwise the examination     was normal (see image4 and image5).    Retroflexed views revealed     no abnormalities.    The scope was then withdrawn from the patient     and the procedure completed.           COMPLICATIONS:  None           ENDOSCOPIC IMPRESSION:     1) Hiatal hernia     RECOMMENDATIONS:     1) await biopsy results     2) anti-reflux regimen to be follow  Dexilant 60 mg po bid     Carafate slurry 10cc po bid, #12 oz, 3 refills     follow up OV 6-8 weeks     consider gastric emptying scan           REPEAT EXAM:  In 0 year(s) for.           ______________________________     Hedwig Morton. Juanda Chance, MD           CC:           n.     eSIGNED:   Hedwig Morton. Rashawd Laskaris at 11/08/2009 04:03 PM           Page 2 of 3   [place], [place], [id number]  Note:  An exclamation mark (!) indicates a result that was not dispersed into the flowsheet. Document Creation Date: 11/08/2009 4:03 PM _______________________________________________________________________  (1) Order result status: Final Collection or observation date-time: 11/08/2009 15:45 Requested date-time:  Receipt date-time:  Reported date-time:  Referring Physician:   Ordering Physician: Lina Sar [phone]) Specimen Source:  Source: Launa Grill Order Number: [id number] Lab site:

## 2010-11-16 NOTE — Letter (Signed)
Summary: [facility]   Imported By: Sherian Rein 01/10/2010 11:38:18  _____________________________________________________________________  External Attachment:    Type:   Image     Comment:   External Document

## 2010-11-16 NOTE — Op Note (Signed)
Summary: Esophageal Manometry (Dr Matthias Hughs)  NAME:  [name], [name]                             ACCOUNT NO.:  [id number]   MEDICAL RECORD NO.:  [id number]                   PATIENT TYPE:  AMB   LOCATION:  ENDO                                 FACILITY:  [facility]   PHYSICIAN:  Bernette Redbird, M.D.                DATE OF BIRTH:  09/17/1941   DATE OF PROCEDURE:  09/07/2003  DATE OF DISCHARGE:  09/07/2003                                 OPERATIVE REPORT   PROCEDURE PERFORMED:  Esophageal manometry.   INDICATIONS FOR PROCEDURE:  The patient is four years status post antireflux  surgery and has had problems with reflux symptoms from chest pain as well as  occasional dysphagia.   FINDINGS:  Moderately severe esophageal dysmotility.   The patient provided written consent for the procedure.  It was performed as  an outpatient by the endoscopy nurse at the [facility] endoscopy unit using  a transnasal solid state manometry catheter.   FINDINGS:  1. Lower esophageal sphincter.  The lower esophageal sphincter had a     measured length of 5 cm, a resting pressure of 10.5 mmHg which is low     normal (normal is 10 to 45 mmHg) and fairly good relaxation of 83%.  2. Esophageal body.  The esophageal body may have been compromised by     dirty data but there were numerous simultaneous and nontransmitted     contractions noted and on my inspection of the tracings, it did indeed     appear that there were multiple simultaneous contractions.  Amplitudes     were on the low side in the lower esophagus with amplitudes measured at     25 and 28 mm (normal  30 to 180).  Durations were normal.  3. Upper esophageal sphincter.  The upper esophageal sphincter had high     normal pressure of 121 mmHg but good relaxation and coordination with     pharyngeal contractions.   IMPRESSION:  1. 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  2. Low esophageal amplitudes.  3. Disordered peristalsis,  characterized primarily by simultaneous     contractions.   PLAN:  Proceed to 24 hour pH monitoring.                                               Bernette Redbird, M.D.    RB/MEDQ  D:  11/17/2003  T:  11/18/2003  Job:  [id number]

## 2010-11-28 DIAGNOSIS — H409 Unspecified glaucoma: Secondary | ICD-10-CM | POA: Insufficient documentation

## 2010-12-20 ENCOUNTER — Emergency Department (HOSPITAL_COMMUNITY): Payer: Medicare Other

## 2010-12-20 ENCOUNTER — Observation Stay (HOSPITAL_COMMUNITY)
Admission: EM | Admit: 2010-12-20 | Discharge: 2010-12-22 | Disposition: A | Discharge status: Home / Self Care | Payer: Medicare Other | Attending: Internal Medicine | Admitting: Internal Medicine

## 2010-12-20 DIAGNOSIS — IMO0001 Reserved for inherently not codable concepts without codable children: Secondary | ICD-10-CM | POA: Insufficient documentation

## 2010-12-20 DIAGNOSIS — M810 Age-related osteoporosis without current pathological fracture: Secondary | ICD-10-CM | POA: Insufficient documentation

## 2010-12-20 DIAGNOSIS — R55 Syncope and collapse: Secondary | ICD-10-CM | POA: Insufficient documentation

## 2010-12-20 DIAGNOSIS — R112 Nausea with vomiting, unspecified: Principal | ICD-10-CM | POA: Insufficient documentation

## 2010-12-20 DIAGNOSIS — M25519 Pain in unspecified shoulder: Secondary | ICD-10-CM | POA: Insufficient documentation

## 2010-12-20 DIAGNOSIS — K224 Dyskinesia of esophagus: Secondary | ICD-10-CM | POA: Insufficient documentation

## 2010-12-20 DIAGNOSIS — F3289 Other specified depressive episodes: Secondary | ICD-10-CM | POA: Insufficient documentation

## 2010-12-20 DIAGNOSIS — R079 Chest pain, unspecified: Secondary | ICD-10-CM | POA: Insufficient documentation

## 2010-12-20 DIAGNOSIS — K219 Gastro-esophageal reflux disease without esophagitis: Secondary | ICD-10-CM | POA: Insufficient documentation

## 2010-12-20 DIAGNOSIS — F329 Major depressive disorder, single episode, unspecified: Secondary | ICD-10-CM | POA: Insufficient documentation

## 2010-12-20 DIAGNOSIS — E785 Hyperlipidemia, unspecified: Secondary | ICD-10-CM | POA: Insufficient documentation

## 2010-12-20 DIAGNOSIS — M1990 Unspecified osteoarthritis, unspecified site: Secondary | ICD-10-CM | POA: Insufficient documentation

## 2010-12-20 LAB — COMPREHENSIVE METABOLIC PANEL
ALT: 18 U/L (ref 0–35)
Albumin: 4 g/dL (ref 3.5–5.2)
Alkaline Phosphatase: 63 U/L (ref 39–117)
Calcium: 9.1 mg/dL (ref 8.4–10.5)
Potassium: 4.1 mEq/L (ref 3.5–5.1)
Sodium: 139 mEq/L (ref 135–145)
Total Protein: 6.9 g/dL (ref 6.0–8.3)

## 2010-12-20 LAB — CBC
HCT: 40.6 % (ref 36.0–46.0)
Hemoglobin: 13.7 g/dL (ref 12.0–15.0)
MCHC: 33.7 g/dL (ref 30.0–36.0)
RBC: 4.23 MIL/uL (ref 3.87–5.11)

## 2010-12-20 LAB — TROPONIN I: Troponin I: 0.01 ng/mL (ref 0.00–0.06)

## 2010-12-20 LAB — CK TOTAL AND CKMB (NOT AT ARMC): CK, MB: 2.5 ng/mL (ref 0.3–4.0)

## 2010-12-20 LAB — PROTIME-INR
INR: 0.92 (ref 0.00–1.49)
Prothrombin Time: 12.6 seconds (ref 11.6–15.2)

## 2010-12-20 LAB — MRSA PCR SCREENING: MRSA by PCR: NEGATIVE

## 2010-12-20 LAB — POCT CARDIAC MARKERS
Myoglobin, poc: 97.6 ng/mL (ref 12–200)
Troponin i, poc: <0.05 ng/mL (ref 0.00–0.09)

## 2010-12-21 DIAGNOSIS — R072 Precordial pain: Secondary | ICD-10-CM

## 2010-12-21 HISTORY — PX: TRANSTHORACIC ECHOCARDIOGRAM: SHX275

## 2010-12-21 LAB — HEMOGLOBIN A1C: Mean Plasma Glucose: 134 mg/dL — ABNORMAL HIGH (ref <117)

## 2010-12-21 LAB — URINALYSIS, MICROSCOPIC ONLY
Glucose, UA: NEGATIVE mg/dL
Ketones, ur: NEGATIVE mg/dL
Leukocytes, UA: NEGATIVE
Nitrite: NEGATIVE
Protein, ur: NEGATIVE mg/dL
Urobilinogen, UA: 0.2 mg/dL (ref 0.0–1.0)

## 2010-12-21 LAB — TROPONIN I
Troponin I: 0.01 ng/mL (ref 0.00–0.06)
Troponin I: 0.02 ng/mL (ref 0.00–0.06)

## 2010-12-21 LAB — CK TOTAL AND CKMB (NOT AT ARMC)
Relative Index: 1.5 (ref 0.0–2.5)
Relative Index: 1.4 (ref 0.0–2.5)
Total CK: 134 U/L (ref 7–177)

## 2010-12-21 LAB — TSH: TSH: 0.652 u[IU]/mL (ref 0.350–4.500)

## 2010-12-22 ENCOUNTER — Encounter (INDEPENDENT_AMBULATORY_CARE_PROVIDER_SITE_OTHER): Payer: Self-pay | Admitting: *Deleted

## 2010-12-22 NOTE — Discharge Summary (Signed)
[name], [name] NO.:  [id number]  MEDICAL RECORD NO.:  [id number]           PATIENT TYPE:  I  LOCATION:  2607                         FACILITY:  [facility]  PHYSICIAN:  Andreas Blower, MD       DATE OF BIRTH:  01-Feb-1941  DATE OF ADMISSION:  12/20/2010 DATE OF DISCHARGE:                              DISCHARGE SUMMARY   PRIMARY CARE PROVIDER:  Elizabeth Palau, Nurse Practitioner associated with most likely Dr. Maryanna Shape.  Cardiology: [facility]  DISCHARGE DIAGNOSES: 1. Near syncope with nausea. 2. Chest pressure with nausea. 3. Nausea, resolved. 4. History of gastroesophageal reflux disease status post Nissen     fundoplication. 5. History of Nissen fundoplication with revision of fundoplication in     2009. 6. History of esophageal dysmotility. 7. Fibromyalgia. 8. Osteoporosis. 9. Degenerative joint disease. 10.Depression. 11.Hyperlipidemia. 12.Right shoulder pain most likely musculoskeletal pain.  DISCHARGE MEDICATIONS: 1. Ibuprofen 200 mg every 8 hours as needed for pain. 2. Ambien 10 mg daily at bedtime as needed. 3. Rosuvastatin 40 mg on Monday, Wednesday, and Friday. 4. Pantoprazole 40 mg p.o. twice daily. 5. Vicodin 5/500 one tablet every 6 hours as needed for pain. 6. Vitamin D 50,000 units on Sundays and Wednesday twice weekly.  BRIEF ADMITTING HISTORY AND PHYSICAL:  [name] is a 70 year old African American female with history of GERD with esophageal dysmotility and status post Nissen fundoplication with revision of fundoplication in 2009 presented on December 20, 2010, with near syncope, nausea, and chest pressure.  RADIOLOGY/IMAGING: 1. The patient had chest x-ray two-view which shows no acute disease. 2. The patient had 2-D echocardiogram on December 21, 2010, which showed     left ventricle cavity size was normal, wall thickness was increased     in the pattern of moderate LVH.  Ejection fraction was 60%.  Wall  motion was normal.  There were no regional wall motion     abnormalities.  LABORATORY DATA:  CBC shows a white count was 6.4, hemoglobin 13.7, hematocrit 40.6, and platelet count of 180.  INR is 0.92.  Electrolytes are normal with a creatinine of 0.83.  Liver function tests are normal. Hemoglobin A1c is 6.3.  Troponins are negative x3.  TSH is 0.652.  UA was negative for nitrites and leukocytes.  MRSA by PCR was negative.  HOSPITAL COURSE: 1. Near syncope.  The patient was admitted and was ruled out for acute     coronary syndrome.  The patient's near syncope may be related to     her GERD and her esophageal dysmotility.  The patient had 2-D     echocardiogram with results as indicated above.  On tele, the     patient does not have any arrhythmias. 2. Chest pressure, most likely secondary to GERD.  The patient was     ruled out for acute coronary syndrome.  Echo with results as     indicated above.  Contacted [facility] at the time of     discharge.  She will arrange an outpatient followup per the     patient.  3. GERD.  Continue the patient on PPI.  The patient to follow up with     Dr. Lina Sar, for further management. 4. Right shoulder pain, most likely musculoskeletal pain.  Had good     range of motion.  If the patient continues to have persistent right     shoulder pain, we will have the patient follow up with Dr. Elizabeth Palau, Nurse Practitioner, her primary care provider for further     management. 5. Hyperlipidemia.  Continue the patient on statin. 6. Fibromyalgia.  Continue the patient on home medications. 7. Nausea, resolved most likely secondary to GERD. 8. Depression, stable. 9. History of osteoporosis, stable.  Continue the patient on vitamin D     at the time of discharge. 10.Hypertension.  Blood pressure stable.  At the time of discharge it     was 119/56, was started on low-dose metoprolol.  However,     metoprolol was discontinued as the  patient was bradycardic.   TIME SPENT ON DISCHARGE:  Talking to the patient and coordination of care was 25 minutes.   Andreas Blower, MD   SR/MEDQ  D:  12/22/2010  T:  12/22/2010  Job:  [id number]  Electronically Signed by Wardell Heath Lincoln Ginley  on 12/22/2010 10:12:53 PM

## 2010-12-25 ENCOUNTER — Telehealth: Payer: Self-pay | Admitting: Internal Medicine

## 2010-12-26 ENCOUNTER — Ambulatory Visit: Payer: Medicare Other | Admitting: Physician Assistant

## 2011-01-02 NOTE — Discharge Summary (Signed)
Summary: Hospital d/c   NAME:  [name], [name] NO.:  [id number]      MEDICAL RECORD NO.:  [id number]           PATIENT TYPE:  I      LOCATION:  2607                         FACILITY:  [facility]      PHYSICIAN:  Andreas Blower, MD       DATE OF BIRTH:  September 21, 1941      DATE OF ADMISSION:  12/20/2010   DATE OF DISCHARGE:                                  DISCHARGE SUMMARY         PRIMARY CARE PROVIDER:  Elizabeth Palau, Nurse Practitioner associated   with most likely Dr. Maryanna Shape.      Cardiology: [facility]      DISCHARGE DIAGNOSES:   1. Near syncope with nausea.   2. Chest pressure with nausea.   3. Nausea, resolved.   4. History of gastroesophageal reflux disease status post Nissen       fundoplication.   5. History of Nissen fundoplication with revision of fundoplication in       2009.   6. History of esophageal dysmotility.   7. Fibromyalgia.   8. Osteoporosis.   9. Degenerative joint disease.   10.Depression.   11.Hyperlipidemia.   12.Right shoulder pain most likely musculoskeletal pain.      DISCHARGE MEDICATIONS:   1. Ibuprofen 200 mg every 8 hours as needed for pain.   2. Ambien 10 mg daily at bedtime as needed.   3. Rosuvastatin 40 mg on Monday, Wednesday, and Friday.   4. Pantoprazole 40 mg p.o. twice daily.   5. Vicodin 5/500 one tablet every 6 hours as needed for pain.   6. Vitamin D 50,000 units on Sundays and Wednesday twice weekly.      BRIEF ADMITTING HISTORY AND PHYSICAL:  [name] is a 70 year old   African American female with history of GERD with esophageal dysmotility   and status post Nissen fundoplication with revision of fundoplication in   2009 presented on December 20, 2010, with near syncope, nausea, and chest   pressure.      RADIOLOGY/IMAGING:   1. The patient had chest x-ray two-view which shows no acute disease.   2. The patient had 2-D echocardiogram on December 21, 2010, which showed       left ventricle  cavity size was normal, wall thickness was increased       in the pattern of moderate LVH.  Ejection fraction was 60%.  Wall       motion was normal.  There were no regional wall motion       abnormalities.      LABORATORY DATA:  CBC shows a white count was 6.4, hemoglobin 13.7,   hematocrit 40.6, and platelet count of 180.  INR is 0.92.  Electrolytes   are normal with a creatinine of 0.83.  Liver function tests are normal.   Hemoglobin A1c is 6.3.  Troponins are negative x3.  TSH is 0.652.  UA   was negative for nitrites and leukocytes.  MRSA by PCR was negative.  HOSPITAL COURSE:   1. Near syncope.  The patient was admitted and was ruled out for acute       coronary syndrome.  The patient's near syncope may be related to       her GERD and her esophageal dysmotility.  The patient had 2-D       echocardiogram with results as indicated above.  On tele, the       patient does not have any arrhythmias.   2. Chest pressure, most likely secondary to GERD.  The patient was       ruled out for acute coronary syndrome.  Echo with results as       indicated above.  Contacted [facility] at the time of       discharge.  She will arrange an outpatient followup per the       patient.   3. GERD.  Continue the patient on PPI.  The patient to follow up with       Dr. Lina Sar, for further management.   4. Right shoulder pain, most likely musculoskeletal pain.  Had good       range of motion.  If the patient continues to have persistent right       shoulder pain, we will have the patient follow up with Dr. Elizabeth Palau, Nurse Practitioner, her primary care provider for further       management.   5. Hyperlipidemia.  Continue the patient on statin.   6. Fibromyalgia.  Continue the patient on home medications.   7. Nausea, resolved most likely secondary to GERD.   8. Depression, stable.   9. History of osteoporosis, stable.  Continue the patient on vitamin D       at the  time of discharge.   10.Hypertension.  Blood pressure stable.  At the time of discharge it       was 119/56, was started on low-dose metoprolol.  However,       metoprolol was discontinued as the patient was bradycardic.         TIME SPENT ON DISCHARGE:  Talking to the patient and coordination of   care was 25 minutes.         Andreas Blower, MD         SR/MEDQ  D:  12/22/2010  T:  12/22/2010  Job:  [id number]      Electronically Signed by Wardell Heath REDDY  on 12/22/2010 10:12:53 PM

## 2011-01-02 NOTE — Progress Notes (Signed)
Summary: triage  Phone Note Call from Patient Call back at [phone]   Caller: Patient Call For: Dr Juanda Chance Reason for Call: Talk to Nurse Summary of Call: Patient wants to speak to nurse wants to be seen before first availble appt 4-12 for non cardiac chest pain. Initial call taken by: Tawni Levy,  December 25, 2010 9:57 AM  Follow-up for Phone Call        Spoke with patient. She was d/c from the hospital after working up chest pain and it was determined to be non cardiaec. She was told to schedule to see her GI MD. Scheduled patient to see Mike Gip, PA on 12/26/10 at 9:30 AM. Follow-up by: Jesse Fall RN,  December 25, 2010 10:34 AM  Additional Follow-up for Phone Call Additional follow up Details #1::        there is absolutely no rush to see this lady. She is having a chronic esophageal problem. cardiac cause has been ruled out. No reason for "add-on". Continue to take PPI, add carafate slurry 10cc  two times a day. Consider repating Baruim esophagram to assess her Nissen Fundoplication. Additional Follow-up by: Hart Carwin MD,  December 25, 2010 10:43 PM     Appended Document: triage Spoke with patient and she would like to wait to see Dr. Juanda Chance. Scheduled patient for 01/12/11 at 2:45 PM with Dr. Juanda Chance. Patient will take her PPI and start the Carafate slurry. Rx to patient's pharmacy. Jesse Fall, RN 12/26/10 8:41 AM   Clinical Lists Changes  Medications: Added new medication of CARAFATE 1 GM/10ML SUSP (SUCRALFATE) Take 10 cc by mouth  BID - Signed Rx of CARAFATE 1 GM/10ML SUSP (SUCRALFATE) Take 10 cc by mouth  BID;  #12 oz x 1;  Signed;  Entered by: Jesse Fall RN;  Authorized by: Hart Carwin MD;  Method used: Electronically to [facility] [address] [phone]*, [address] [place], [place], [place]  [address], Ph: [phone] or [phone], Fax: [phone]    Prescriptions: CARAFATE 1 GM/10ML SUSP (SUCRALFATE) Take 10 cc by mouth  BID  #12 oz x 1  Entered by:   Jesse Fall RN   Authorized by:   Hart Carwin MD   Signed by:   Jesse Fall RN on 12/26/2010   Method used:   [facility]        [facility] [address] [phone]* (retail)       [address]       [place], [place]  [address]       Ph: [phone] or [phone]       Fax: [phone]   RxID:   [id number]

## 2011-01-12 ENCOUNTER — Encounter: Payer: Self-pay | Admitting: Internal Medicine

## 2011-01-12 ENCOUNTER — Ambulatory Visit (INDEPENDENT_AMBULATORY_CARE_PROVIDER_SITE_OTHER): Payer: Medicare Other | Admitting: Internal Medicine

## 2011-01-12 VITALS — BP 126/78 | HR 64 | Ht 70.0 in | Wt 168.8 lb

## 2011-01-12 DIAGNOSIS — K219 Gastro-esophageal reflux disease without esophagitis: Secondary | ICD-10-CM

## 2011-01-12 MED ORDER — AL HYD-MG TR-ALG AC-SOD BICARB PO CHEW: 2.0000 | CHEWABLE_TABLET | Freq: Three times a day (TID) | ORAL | Status: DC

## 2011-01-12 MED ORDER — ESOMEPRAZOLE MAGNESIUM 40 MG PO CPDR: 40.0000 mg | DELAYED_RELEASE_CAPSULE | Freq: Every day | ORAL | Status: DC

## 2011-01-12 MED ORDER — METOCLOPRAMIDE HCL 5 MG PO TABS: ORAL_TABLET | ORAL | Status: DC

## 2011-01-12 MED ORDER — ESOMEPRAZOLE MAGNESIUM 40 MG PO CPDR: DELAYED_RELEASE_CAPSULE | ORAL | Status: DC

## 2011-01-12 NOTE — Progress Notes (Signed)
[name] 07-22-1941 MRN [id number]   History of Present Illness:  This is a 70 year old African American female with severe gastroesophageal reflux disease who comes with recurrent episodes of nausea, chest tightness and dizziness which was recently evaluated by cardiology. She is expecting completion of her cardiac event monitoring. Her episodes usually occur during the day in relationship to meals or to position. She had a Nissen fundoplication in 2002 with take down in November 2009 due to herniation of the stomach above the fundoplication. Her gastric emptying scan in March 2011 was normal with 12 % retention in 2 hours. A CT scan of the abdomen in 2005 was normal and her HIDA scan in 2005 showed a normal ejection fraction of 82%. A barium esophagram in 2006 was normal with the exception of a hiatal hernia. A 13 mm tablet passed without delay. Her last colonoscopy in 2003 showed a tubular adenoma and diverticulosis. She is currently on Protonix 40 mg twice a day and Carafate slurry 10 cc twice a day.   Past Medical History  Diagnosis Date  . GERD (gastroesophageal reflux disease)     subsequent Nissen Fundoplication  . Fibromyalgia   . Hyperlipidemia   . Depression   . Degenerative joint disease   . Osteoporosis   . Esophageal dysmotility   . Arthritis   . Glaucoma   . Sleep apnea   . Hiatal hernia 11/08/09  . Diverticulosis 2003  . Hx of adenomatous colonic polyps 07/02/02  . Chronic back pain    Past Surgical History  Procedure Date  . Nissen fundoplication 2000    with subsequent takedown in 2009  . Gastric resection 2009    reports that she has never smoked. She has never used smokeless tobacco. She reports that she does not drink alcohol or use illicit drugs. family history includes Breast cancer in her daughter; Diabetes in her father and mother; and Heart disease in her mother.  There is no history of Colon cancer. Allergies  Allergen Reactions  . Delacort  (Hydrocortisone Base)   . Meperidine Hcl     demerol  . Naproxen         Review of Systems: Positive for dizziness or weakness and chest discomfort. Negative for dysphagia. Positive for nausea.  The remainder of the 10  point ROS is negative except as outlined in H&P   Physical Exam: General appearance  Well developed, in no distress. Eyes- non icteric. HEENT nontraumatic, normocephalic. Mouth no lesions, tongue papillated, no cheilosis. Neck supple without adenopathy, thyroid not enlarged, no carotid bruits, no JVD. Lungs Clear to auscultation bilaterally. Cor normal S1 normal S2, regular rhythm , no murmur,  quiet precordium. Abdomen soft nontender abdomen with well healed surgical scar in upper abdomen. Normoactive bowel sounds. No distention. Liver edge at posterior margin. Extremities no pedal edema. Skin no lesions. Neurological alert and oriented x 3. Psychological normal mood and affect.  Assessment and Plan:  Problem #1 episode of chest pain and nausea. Cardiac versus GI origin. She had a takedown of her Nissen fundoplication and has gastroesophageal reflux which is likely symptomatic. We will obtain a barium esophagram and upper GI series to assess the size of the hernia and the reflux as well. We will switch her to Nexium 40 mg twice a day and will have her to discontinue Carafate 10 cc twice a day. She will also add Gaviscon 2 tablets postprandially and Reglan 5 mg before meals. Her last upper endoscopy was done 1 year  ago and showed a hiatal hernia.  Problem #2 history of colon polyps. Patient had a tubular adenoma in 2003. She will be due for a recall colonoscopy. We will find out if she has had a recent colonoscopy elsewhere.  Plan  Antireflux measures.  Gaviscon 2 tablets four times daily Reglan 5 mg before meals. Nexium 40 mg by mouth twice a day. Barium esophagram and upper GI series. Recall colonoscopy in 2012.  01/12/2011 Lina Sar

## 2011-01-12 NOTE — Patient Instructions (Addendum)
You have been scheduled for a barium esophagram and upper GI series at [facility] on 01/16/11 (Tuesday). You should arrive at 8:45 am for registration. Please make certain to have nothing to eat or drink after midnight on the night before your test. Please take Gaviscon 2 tablets by mouth four times daily. This can be purchased over the counter. Please take Nexium 40 mg  Samples 1 tablet by mouth once daily in place of Protonix. We have sent a prescription for Reglan 5 mg. You should take 1 tablet by mouth before meals (three times daily). Please schedule a follow up appointment with Dr Juanda Chance in 2 months.

## 2011-01-16 ENCOUNTER — Ambulatory Visit (HOSPITAL_COMMUNITY)
Admission: RE | Admit: 2011-01-16 | Discharge: 2011-01-16 | Disposition: A | Discharge status: Home / Self Care | Payer: Medicare Other | Source: Ambulatory Visit | Attending: Internal Medicine | Admitting: Internal Medicine

## 2011-01-16 ENCOUNTER — Other Ambulatory Visit: Payer: Self-pay | Admitting: Family Medicine

## 2011-01-16 DIAGNOSIS — N63 Unspecified lump in unspecified breast: Secondary | ICD-10-CM

## 2011-01-16 DIAGNOSIS — K219 Gastro-esophageal reflux disease without esophagitis: Secondary | ICD-10-CM | POA: Insufficient documentation

## 2011-01-16 DIAGNOSIS — K449 Diaphragmatic hernia without obstruction or gangrene: Secondary | ICD-10-CM | POA: Insufficient documentation

## 2011-01-16 DIAGNOSIS — Z780 Asymptomatic menopausal state: Secondary | ICD-10-CM

## 2011-01-16 DIAGNOSIS — R0789 Other chest pain: Secondary | ICD-10-CM | POA: Insufficient documentation

## 2011-01-17 ENCOUNTER — Encounter: Payer: Self-pay | Admitting: *Deleted

## 2011-01-18 LAB — CBC
HCT: 40.4 % (ref 36.0–46.0)
Hemoglobin: 13.8 g/dL (ref 12.0–15.0)
MCHC: 34.1 g/dL (ref 30.0–36.0)
MCV: 94.9 fL (ref 78.0–100.0)
RBC: 4.25 MIL/uL (ref 3.87–5.11)
RDW: 14.3 % (ref 11.5–15.5)

## 2011-01-18 LAB — POCT CARDIAC MARKERS
Myoglobin, poc: 108 ng/mL (ref 12–200)
Troponin i, poc: <0.05 ng/mL (ref 0.00–0.09)

## 2011-01-18 LAB — DIFFERENTIAL
Eosinophils Absolute: 0.2 10*3/uL (ref 0.0–0.7)
Lymphs Abs: 1.7 10*3/uL (ref 0.7–4.0)
Monocytes Relative: 7 % (ref 3–12)
Neutro Abs: 2.1 10*3/uL (ref 1.7–7.7)
Neutrophils Relative %: 50 % (ref 43–77)

## 2011-01-18 LAB — COMPREHENSIVE METABOLIC PANEL
ALT: 18 U/L (ref 0–35)
BUN: 14 mg/dL (ref 6–23)
CO2: 30 mEq/L (ref 19–32)
Calcium: 9.3 mg/dL (ref 8.4–10.5)
Creatinine, Ser: 0.8 mg/dL (ref 0.4–1.2)
GFR calc non Af Amer: >60 mL/min (ref >60)
Glucose, Bld: 91 mg/dL (ref 70–99)
Total Protein: 7.2 g/dL (ref 6.0–8.3)

## 2011-01-18 LAB — LIPASE, BLOOD: Lipase: 14 U/L (ref 11–59)

## 2011-01-19 ENCOUNTER — Ambulatory Visit
Admission: RE | Admit: 2011-01-19 | Discharge: 2011-01-19 | Disposition: A | Discharge status: Home / Self Care | Payer: Medicare Other | Source: Ambulatory Visit | Attending: Family Medicine | Admitting: Family Medicine

## 2011-01-19 ENCOUNTER — Other Ambulatory Visit: Payer: Self-pay | Admitting: Family Medicine

## 2011-01-19 DIAGNOSIS — Z780 Asymptomatic menopausal state: Secondary | ICD-10-CM

## 2011-01-19 DIAGNOSIS — N63 Unspecified lump in unspecified breast: Secondary | ICD-10-CM

## 2011-01-22 HISTORY — PX: CARDIOVASCULAR STRESS TEST: SHX262

## 2011-01-23 ENCOUNTER — Other Ambulatory Visit: Payer: Self-pay | Admitting: Family Medicine

## 2011-01-23 ENCOUNTER — Telehealth: Payer: Self-pay | Admitting: Internal Medicine

## 2011-01-23 ENCOUNTER — Ambulatory Visit
Admission: RE | Admit: 2011-01-23 | Discharge: 2011-01-23 | Disposition: A | Discharge status: Home / Self Care | Payer: Medicare Other | Source: Ambulatory Visit | Attending: Family Medicine | Admitting: Family Medicine

## 2011-01-23 DIAGNOSIS — Z780 Asymptomatic menopausal state: Secondary | ICD-10-CM

## 2011-01-23 MED ORDER — PANTOPRAZOLE SODIUM 40 MG PO TBEC: 40.0000 mg | DELAYED_RELEASE_TABLET | Freq: Every day | ORAL | Status: DC

## 2011-01-23 NOTE — H&P (Signed)
[name], [name] NO.:  [id number]  MEDICAL RECORD NO.:  [id number]           PATIENT TYPE:  E  LOCATION:  [facility]                         FACILITY:  [facility]  PHYSICIAN:  Altha Harm, MDDATE OF BIRTH:  [birth year]  DATE OF ADMISSION:  12/20/2010 DATE OF DISCHARGE:                             HISTORY & PHYSICAL   CHIEF COMPLAINT:  Near syncope with nausea and some chest pressure.  HISTORY OF PRESENT ILLNESS:  [name] is a 70 year old African American female with significant gastrointestinal history in the form of significant gastroesophageal reflux disease with esophageal dysmotility and status post a Nissen fundoplication with revision of the fundoplication in 2009.  The patient states that, yesterday while she was in [employer] standing alone, she had a "bad feelings."  She states that she felt as if she was going to faint and she felt nauseous.  The patient states that the nausea came before the feeling of almost fainting.  She sat down and then was able to continue on with what she was doing.  She said that subsequent to that, she also had some pressure in her chest, which is not particularly overwhelming and does not register as a significant symptom.  She states that she has had several feelings of the nausea to her stomach and the "bad feelings," which have not been associated with the dizziness.  She is unable to associate the chest pressure with the nausea and states that the chest pressure is very underwhelming, but constant.  She states that it is occurring in the epigastric and left chest area.  It is nonradiating and she states she really would not even give it a number as it is just a very, very low background pressure occurring.  The patient states that she got up this morning and had that nausea to her stomach and had "bad feelings" again.  None of these symptoms have interfered with her ability to tolerate her diet which she has done.   She has had no cough, no diarrhea, no chills, no vomiting.  She has had no problems with urination or elimination.  The patient does offer that she had been on prescription strength Zegerid.  She states that when it went to an over- the-counter formulation, it was less effective had been at prescriptions strength.  She states she was unable to afford the prescription strength and was placed on Dexilant, which was equally unaffordable to her and thus, Dr. Juanda Chance her gastroenterologist switched her over to Protonix.  In terms of a cardiac history and workup the patient had a cardiac cath done more than 10 years ago and she said it was normal at that time. She has had no problems with any cardiac issues as far as she knows, has never been under the care of a cardiologist except that she had a cardiac cath in the hospital.  She states that she had a stress test done I think in 2009, which was felt to be normal.  PAST MEDICAL HISTORY:  Significant for the following, 1. Gastroesophageal reflux disease status post Nissen fundoplication  with a revision of the fundoplication in 2009. 2. Esophageal dysmotility, which the patient describes as severe. 3. Fibromyalgia. 4. Osteoporosis. 5. Degenerative joint disease. 6. Depression. 7. Hyperlipidemia.  FAMILY HISTORY:  Significant for hypertension.  There is no cardiac disease within her family.  SOCIAL HISTORY:  The patient denies any tobacco, alcohol, or drug use. Her next of kin is her husband, [name] who can be reached on his cell phone at [phone] and on home phone at [phone].  CODE STATUS:  In terms of code status, the patient is presently a full code.  MEDICATIONS:  In terms of her medications, her medications have been reviewed.  The patient is on Protonix, Vicodin, Ambien, Crestor, Advil, and vitamin D.  ALLERGIES: 1. DEMEROL. 2. DILACOR. 3. NAPROXEN.  PRIMARY CARE PHYSICIAN:  [facility].  The  patient previously had seen Dr. Larina Bras who has closed the practice and the patient is now being seen at [facility].  REVIEW OF SYSTEMS:  All other systems negative.  STUDIES IN THE EMERGENCY ROOM:  Point-of-care enzymes were negative. INR 0.92, hemoglobin is 13.7, hematocrit 40.6, white blood cell count 6.4, and platelets 180.  Sodium 139, potassium 4.1, chloride 104, bicarb 31, BUN 13, and creatinine 0.83.  A 12-lead EKG shows sinus bradycardia and findings consistent with left ventricular hypertrophy.  PHYSICAL EXAMINATION:  GENERAL:  The patient is a thin-appearing, very tall female in no acute distress. VITAL SIGNS:  Her temperature is 98.3, heart rate 65, blood pressure 149/84, respiratory rate 16, and O2 sats are 99% on room air. HEENT:  She is normocephalic, atraumatic.  Pupils are equally round and reactive to light and accommodation.  Extraocular movements are intact. Oropharynx is moist.  No exudate, erythema, or lesions are noted. NECK:  Trachea is midline.  No masses.  No thyromegaly.  No JVD.  No carotid bruit. RESPIRATORY:  The patient has a normal respiratory effort, equal excursion bilaterally.  No wheezing or rhonchi noted.  No increased vocal fremitus. CARDIOVASCULAR:  She has got a normal S1 and S2.  No murmurs, rubs, or gallops noted.  PMI is nondisplaced.  No heaves or thrills on palpation. ABDOMEN:  Obese, soft, nontender, and nondistended.  No masses.  No hepatosplenomegaly noted. LYMPH NODE SURVEY:  She has got no cervical, axillary, or inguinal lymphadenopathy noted. NEUROLOGIC:  She has got no focal neurological deficits.  Cranial nerves II through XII are grossly intact.  DTRs are 2+ bilaterally in the upper and lower extremity.  ASSESSMENT:  This is a patient who presents with, 1. Near-syncope. 2. Nausea. 3. Gastroesophageal reflux disease. 4. Esophageal dysmotility. 5. Fibromyalgia. 6. Depression. 7. Hyperlipidemia.  DISCUSSION AND PLAN:   I suspect that this patient's symptoms are likely related to her esophageal dysmotility and her gastroesophageal reflux disease.  She probably has lesser control, now that her medications had to be adjusted.  However given her age and her hyperlipidemia, it is imperative that we rule out a cardiac cause for this.  The patient will be admitted under observation to a telemetry unit.  She will have serial enzymes to rule out her resting ischemia.  We will also get a 2-D echocardiogram to evaluate for left ventricular function and we will observe her on telemetry for any arrhythmias.  We will observe to see whether or not these symptoms are reproduced in the patient.  If the patient has no further evidence of acute coronary syndrome, then the rounding physician will make a decision  about any further workup here in the hospital based upon the patient's clinical course and initial response to therapy.  The patient does appear to have some problems with at least stage I hypertension, which is untreated and should likely be on some form of an antihypertensive.  I will place the patient on a low dose of metoprolol only because of the risks of the chest pain. However, that is to be held if her heart rate is less than 60.  If she continues to have a heart rate in the 50s, then it would be reasonable to place her on a low-dose antihypertensives in the form of may be Norvasc or some medication that is not rate limiting in order for better blood pressure control.  The patient has a history of hyperlipidemia and is already on Crestor and is on the care of her primary care physician, thus I will not at this point pursue a lipid panel on her as we will leave that to her primary care physician to further adjust her medications.  The patient will be put on DVT prophylaxis with Lovenox and will be continued on her Protonix.     Altha Harm, MD     MAM/MEDQ  D:  12/20/2010  T:  12/20/2010   Job:  [id number]  cc:   Hedwig Morton. Juanda Chance, MD [place] Daphine Deutscher, MD Dr. Maryanna Shape  Electronically Signed by Marthann Schiller MD on 01/23/2011 07:34:21 PM

## 2011-01-23 NOTE — Telephone Encounter (Signed)
Patient given Dr. Regino Schultze recommendations. She will need an rx sent to [facility] for Protonix. She will stop to Nexium.

## 2011-01-23 NOTE — Telephone Encounter (Signed)
Patient calling to report she has worse burning in her throat and chest since she changed her medications last week. She stopped the new medications(Nexium, Reglan and Gaviscon) and took Protonix again and she feels better. She states she is taking the Protonix BID. Wants to know if she can take Protonix TID.

## 2011-01-23 NOTE — Telephone Encounter (Signed)
It usually does not have any additional benefit to go beyond bid Protonix. She may take more Mylanta or gaviscion prn.

## 2011-01-24 ENCOUNTER — Telehealth: Payer: Self-pay | Admitting: *Deleted

## 2011-01-24 MED ORDER — PANTOPRAZOLE SODIUM 40 MG PO TBEC: 40.0000 mg | DELAYED_RELEASE_TABLET | Freq: Two times a day (BID) | ORAL | Status: DC

## 2011-01-24 NOTE — Telephone Encounter (Signed)
Patient calling to request the Protonix rx for BID be for 90 days instead of 30 for her insurance. Will send new rx for patient.

## 2011-01-29 ENCOUNTER — Ambulatory Visit
Admission: RE | Admit: 2011-01-29 | Discharge: 2011-01-29 | Disposition: A | Discharge status: Home / Self Care | Payer: Medicare Other | Source: Ambulatory Visit | Attending: Family Medicine | Admitting: Family Medicine

## 2011-01-29 DIAGNOSIS — Z780 Asymptomatic menopausal state: Secondary | ICD-10-CM

## 2011-02-13 DIAGNOSIS — E559 Vitamin D deficiency, unspecified: Secondary | ICD-10-CM | POA: Insufficient documentation

## 2011-02-14 DIAGNOSIS — I10 Essential (primary) hypertension: Secondary | ICD-10-CM | POA: Insufficient documentation

## 2011-02-14 DIAGNOSIS — K219 Gastro-esophageal reflux disease without esophagitis: Secondary | ICD-10-CM | POA: Insufficient documentation

## 2011-02-27 NOTE — Op Note (Signed)
[name], [name]                   ACCOUNT NO.:  [id number]   MEDICAL RECORD NO.:  [id number]          PATIENT TYPE:  AMB   LOCATION:  ENDO                         FACILITY:  [facility]   PHYSICIAN:  Sandria Bales. Ezzard Standing, M.D.  DATE OF BIRTH:  October 19, 1940   DATE OF PROCEDURE:  06/07/2008  DATE OF DISCHARGE:                               OPERATIVE REPORT   Date of surgery ??   PREOPERATIVE DIAGNOSIS:  History of Nissen fundoplication with  questionable ulcer, gastric ulcer.   POSTOPERATIVE DIAGNOSIS:  Esophagogastric junction at 35 centimeters.  There is a 3 to 4 centimeter hiatal hernia versus stomach above failed  fundoplication wrap, no evidence of  gastric ulcer, multiple benign-  appearing gastric polyps.   OPERATION PERFORMED:  Esophagogastroduodenoscopy.   SURGEON:  Ovidio Kin, MD.   FIRST ASSISTANT:  None.   ANESTHESIA:  Fentanyl 50 mcg, Versed 3 mg.   COMPLICATIONS:  None.   INDICATIONS FOR PROCEDURE:  Ms. Newcom is a 70 year old black female who  is a patient of Dr. Wenda Low, who underwent a laparoscopic Nissen  fundoplication on December 19, 1998.  She did well for about 4 or 5 years  but about 4 or 5 years ago developed increasing epigastric pain with  nausea and reflux.  She last underwent endoscopy by Dr. Matthias Hughs in May  2006.  He did note benign gastric polyps at that time and biopsied these  and they proved benign on pathology.   She has had a recent upper GI dated the 22nd of July, 2009, which shows  a questionable gastric ulcer in the region of the fundoplication and  multiple episodes of gastroesophageal reflux noted.  I am doing  endoscopy 1) To check to see if she has an gastric ulcer, 2) To evaluate  her prior Nissen fundoplication.   The indication and potential complications were explained to the  patient.  Potential complications include perforation, bleeding.   OPERATIVE NOTE:  Patient placed in the left lateral decubitus position after the back of  her  throat was anesthetized with Cetacaine.  She was placed on 2L of  nasal O2, monitored with pulse oximetry, blood pressure cuff and EKG.   With the patient in the left lateral decubitus position, a flexible  Pentax endoscope was passed without difficulty down the back of her  throat into her stomach.  I was able to advance the scope into the  duodenum.   I saw the first and second portion of duodenums were unremarkable, the  pylorus was unremarkable.  Pulling the scope back within the stomach,  the patient had multiple gastric polyps, probably at least 20-30 of  these.  These were all benign, flat appearing and they have been  previously biopsy by Dr. Matthias Hughs so I did not rebiopsy these at this  time and none had any suspicious appearance to them.  I then retroflexed  the scope.  The patient's wrap was fairly loose and I could actually  pull the retroflex scope up within above the wrap and I visualized the Z-  line.  I then  straightened the scope out, identified the Z-line about 35  cm.  It was hard to tell whether I was looking at the hiatus or whether  it was part of the wrap.  I am guessing the defect is probably part of  the wrap, it is about 3 to 4 cm below the Z-line.  I saw no evidence of  gastric ulcer, I am guessing what was interpreted as a gastric ulcer  were some folds from the prior fundoplication.  She probably has at  least a 2-3 cm hiatal hernia, but again it is hard to tell from where  the wrap fails to her gastroesophageal junction.   Esophagus was unremarkable.  She tolerated procedure well and will be in  touch with Dr. Daphine Deutscher in 2-4 weeks for followup discussion whether she  needs to have this wrap resolved.   She is having significant symptoms of reflux and pain and discomfort.  Her daughter was at the bedside, I gave them copies of the photos.      Sandria Bales. Ezzard Standing, M.D.  Electronically Signed     DHN/MEDQ  D:  06/07/2008  T:  06/07/2008  Job:  [id number]    cc:   [place] Daphine Deutscher, MD  [address]  [place]  [place] [address]

## 2011-02-27 NOTE — Op Note (Signed)
[name], [name]                   ACCOUNT NO.:  [id number]   MEDICAL RECORD NO.:  [id number]          PATIENT TYPE:  INP   LOCATION:  1526                         FACILITY:  [facility]   PHYSICIAN:  [place]. Daphine Deutscher, MD  DATE OF BIRTH:  1941/07/05   DATE OF PROCEDURE:  08/20/2008  DATE OF DISCHARGE:                               OPERATIVE REPORT   PREOPERATIVE DIAGNOSIS:  Recurrent hiatal hernia with persistent  symptoms of significant gastroesophageal reflux.   POSTOPERATIVE DIAGNOSIS:  Intact Nissen wrap with herniation of stomach  above wrap into chest.   PROCEDURE:  Laparoscopic takedown of Nissen and the herniated stomach,  upper endoscopy by Dr. Colin Benton and Dr. Abbey Chatters and myself, resection of  a portion of the cardia that created the previous wrap and hiatus  closure.   OPERATIVE TIME:  Five hours.   SURGEON:  [place]. Daphine Deutscher, MD   ASSISTANT:  Adolph Pollack, MD.   FINDINGS:  Very stuck and distorted anatomy with an intact Nissen wrap  but herniated stomach above that and marked inflammatory reaction.   DESCRIPTION OF PROCEDURE:  [name] was taken to OR #1 on Friday,  August 20, 2008, and given general anesthesia.  The abdomen was prepped  with ChloraPrep and draped sterilely.  Her upper GI series was brought  up on the intraoperative monitoring device and her previous operative  note was examined.  [name] had undergone a laparoscopic Nissen  fundoplication in March of 2000 by me and by Dr. Abbey Chatters for  refractory esophageal reflux.  At that time she had an accessory hepatic  vessel noted and a lot of significant distal esophagitis inflammatory  reaction.   The abdomen was entered using an OptiView through the left upper  quadrant without difficulty.  The abdomen was insufflated.  A 5 mm was  placed in the upper abdomen for the [facility] and then standard two 11's  on the right, the medial one of which was subsequently changed out to a  12 later in the  case and another 5 was placed laterally and another 11  below for the camera.   Dissection began by taking the wrapped portion down from the liver.  This was a tedious dissection done with scissors and with a Harmonic but  was ultimately accomplished.  We were able to discern the knots from the  three tied knots from the Nissen wrap that also had clips on them.  We  spent a total of 5 hours taking down the anatomy which was at times very  obscured by the inflammatory reaction and scar.  We then came upon the  accessory hepatic vessel and preserved that and worked back and forth,  finally putting a Penrose drain above for traction.  With that in place  we were able then to take down the wrapped portion of the stomach which  was finally found to be really stuck and plastered to the crura  posteriorly on the right.  I ended up taking that down with the Harmonic  scalpel and creating a little gastrotomy there which  and also it was a  pretty well beaten up portion of the stomach where it had not only been  wrapped but where it was stuck to the diaphragm.  Ultimately when all  was said and done, we oriented that portion of the cardia and resected  it with three firings of the Endo GIA 6 cm stapler.  In the middle of  all this we endoscoped her, having a little difficulty getting through  her cricopharyngeal region but did not see any obvious injury but would  look more on the look out for when we came out.  There was no evidence  of any injury to our inspection.  However, we did get down and we were  able to look at the stomach and identify the landmarks and we left the  endoscope in place through the case and the esophagus with light turned  off and at the very end we submerged everything and insufflated and did  not see any bubbles or evidence of any leaks.  This resected portion of  the cardia was put in a bag and brought out through the 12 mm port.  The  crura were closed posteriorly with a  single suture.  I did not use  pledgets since there had been that gastrotomy and I did not want to  contaminate the felt.  There was basically no spillage from that,  however.  In the end I felt that her symptomatology was largely because  she had a very snug wrap that she had herniated above and was trapping  gastric mucosa above that.  After the completion of the case everything  looked to be in order.  The abdomen was deflated and wounds were closed  with 4-0 Vicryl.  The patient was taken to the recovery room in  satisfactory condition.      [place] Daphine Deutscher, MD  Electronically Signed     MBM/MEDQ  D:  08/20/2008  T:  08/21/2008  Job:  [id number]   cc:   [name], M.D.   [name], M.D.  Fax: [phone]

## 2011-03-02 NOTE — H&P (Signed)
Cheryl, Bates NO.:  [id number]   MEDICAL RECORD NO.:  [id number]          PATIENT TYPE:  INP   LOCATION:  4733                         FACILITY:  [facility]   PHYSICIAN:  Elliot Cousin, M.D.    DATE OF BIRTH:  May 16, 1941   DATE OF ADMISSION:  08/14/2006  DATE OF DISCHARGE:                                HISTORY & PHYSICAL   PRIMARY CARE PHYSICIAN:  [facility] ([name], nurse  practitioner).   CHIEF COMPLAINT:  Chest pain.   HISTORY OF PRESENT ILLNESS:  The patient is a 70 year old woman with a past  medical history significant for chest pain secondary to gastroesophageal  reflux disease, history of esophageal dysmotility, fibromyalgia and  osteoporosis, who presents to the emergency department with a 1- to 2-day  history of chest pain.  The pain started approximately a day and a half ago.  It is located in the central chest.  It was initially intermittent; however,  it has become constant over the past 24 hours.  At its worst, the pain has  been rated as an 8/10 in intensity.  It occurs with rest and with activity.  It is associated with nausea, but it is not associated with diaphoresis,  shortness of breath, pleurisy, radiation, or lightheadedness.  She has had  some belching lately.  She took Vicodin and Mobic earlier yesterday, but it  did not relieve her pain.   During the evaluation in the emergency department, the patient is noted to  be without any pain (after a nitroglycerin patch was placed).  Her EKG  reveals sinus bradycardia with a heart rate of 58 beats per minute.  She is  moderately hypertensive with a systolic blood pressure of 173.  Her initial  cardiac markers are negative.  The patient, however, will be admitted for  further evaluation and management.   PAST MEDICAL HISTORY:  1. History of chest pain thought to be secondary to gastroesophageal      reflux disease and esophageal dysmotility.  2. Cardiolite study  in April of 2006 was negative for reversible ischemia.      Her ejection fraction was measured at 62%.  3. Gastroesophageal reflux disease with a history of Nissen fundoplication      in the past.  4. Hiatal hernia and gastric polyp per EGD in September of 2003 by Dr.      Matthias Hughs.  5. Moderately severe esophageal dysmotility per manometry study in      November of 2004.  6. Diverticulosis and colon polyps per colonoscopy by Dr. Matthias Hughs in      September of 2003.  7. Fibromyalgia.  8. Osteoporosis.  9. Degenerative joint disease.  10.Depression.  11.Remote history of hypertension and dyslipidemia.   MEDICATIONS:  1. Ambien 10 mg nightly p.r.n.  2. Vicodin 5/500 mg one to two tablets every 4 hours as needed for pain.  3. Fluoxetine 20 mg daily.  4. Evista 60 mg daily.  5. Mobic 15 mg daily.  6. Zegerid 40/1100 mg nightly.  7. Clobetasol 0.05% ointment applied to the scalp  p.r.n. for itching.   ALLERGIES:  The patient has allergies to ADALAT, DEMEROL, and NAPROXEN.   SOCIAL HISTORY:  The patient is married.  [place]  She has 7 children.  She is disabled now.  She denies tobacco,  alcohol and illicit drug use.   FAMILY HISTORY:  Her mother is 69 years of age and has a history of angina,  diabetes mellitus, and hypertension.  Her father died of complications  secondary to diabetes mellitus.   REVIEW OF SYSTEMS:  The patient's review of systems is positive for diffuse  muscle pain, right hip pain, bilateral shoulder pain, and bilateral ankle  pain.  Her review of systems is positive for an itchy scalp, insomnia, and  depression, but no suicidal ideation.  Otherwise, review of systems is  negative.   PHYSICAL EXAMINATION:  VITAL SIGNS:  Temperature 97.9, blood pressure  173/76, pulse 58, respiratory rate 18, oxygen saturation 98% on room air.  GENERAL:  The patient is a pleasant 70 year old mildly overweight African  American woman who is  currently lying in bed in no acute distress.  HEENT:  Head is normocephalic and nontraumatic.  Pupils are equal, round and  reactive to light.  Extraocular movements are intact.  Conjunctivae are  clear.  Sclerae are white.  Tympanic membranes are clear bilaterally.  Nasal  mucosa is moist.  No sinus tenderness.  Oropharynx reveals moist mucous  membranes.  No posterior exudates or erythema.  NECK:  Supple with no adenopathy, no thyromegaly, no bruit, no JVD.  LUNGS:  Clear to auscultation bilaterally.  HEART:  S1 and S2 with no murmurs, rubs, or gallops.  ABDOMEN:  Positive bowel sounds.  Soft, nontender and non-distended.  No  hepatosplenomegaly.  No masses palpated.  EXTREMITIES:  Pedal pulses are 2+ bilaterally.  No pretibial edema and no  pedal edema.  NEUROLOGIC:  The patient is alert and oriented x3.  Cranial nerves II-XII  are intact.  Strength is 5/5 throughout.  Sensation is intact.   ADMISSION LABORATORY DATA:  EKG reveals sinus bradycardia with a heart rate  of 58 beats per minute and no other acute abnormalities.   Chest x-ray reveals cardiac enlargement and vascular congestion.   D-dimer 0.36, CK-MB 1.5, troponin I less than 0.05, myoglobin 85.2.  Sodium  139, potassium 4.1, chloride 107, glucose 97, BUN 16, bicarbonate 30,  creatinine 1.1.  PT 13.5, INR 1.0.   ASSESSMENT:  1. Precordial chest pain.  The patient's chest pain appears to be      atypical.  The differential diagnoses include angina, pulmonary      embolism, esophagitis, gastroesophageal reflux disease, and      musculoskeletal pain.  Given that the patient's initial cardiac enzymes      are negative and her EKG appears to be unremarkable with the exception      of mild bradycardia, more than likely the patient's pain is not      secondary to angina.  However, she will be admitted to rule out for a      myocardial infarction.  Also, her D-dimer is within normal limits, so     the chest pain is less likely  the consequence of a pulmonary embolism.      The patient does have a strong positive history of esophagitis,      esophageal dysmotility and gastroesophageal reflux disease.  It is      probable that her chest pain is secondary  to a gastrointestinal source.      Of note, she is chronically treated with Evista and Mobic; both can      either cause or worsen esophagitis and reflux.   1. Elevated blood pressure.  The patient's systolic blood pressure is      moderately elevated in the emergency department.  She has a remote      history of hypertension; however, over the years, she has not required      antihypertensive medications per her account.  2. Mild sinus bradycardia.  The patient's EKG reveals a heart rate of 58      beats per minute.  3. Gastroesophageal reflux disease with a history of esophageal      dysmotility.  The patient had been followed by gastroenterologist, Dr.      Matthias Hughs, in the past.  She is chronically treated with Zegerid.   PLAN:  1. The patient will be admitted for further evaluation and management.  2. We will treat the patient's pain with Dilaudid and oxycodone as needed.      We will add nitroglycerin paste a quarter of an inch every 6 hours x24      hours and then p.r.n. thereafter.  3. We will start empiric aspirin therapy 81 mg daily.  We will add      Lopressor at 12.5 mg daily.  4. We will start Protonix at a b.i.d. dosing.  5. We will hold Evista and Mobic.  6. We will check cardiac enzymes and a 2-D echocardiogram.  We will also      assess the patient's TSH, fasting lipid panel, and homocysteine level.  7. Consider cardiology and/or gastroenterology consultation.      Elliot Cousin, M.D.  Electronically Signed     DF/MEDQ  D:  08/14/2006  T:  08/14/2006  Job:  [id number]   cc:   [facility] Cheryl Fendt, NP

## 2011-03-02 NOTE — Op Note (Signed)
   NAME:  [name], [name]                             ACCOUNT NO.:  [id number]   MEDICAL RECORD NO.:  [id number]                   PATIENT TYPE:  AMB   LOCATION:  ENDO                                 FACILITY:  [facility]   PHYSICIAN:  Florencia Reasons, M.D.             DATE OF BIRTH:  02-16-1941   DATE OF PROCEDURE:  07/02/2002  DATE OF DISCHARGE:                                 OPERATIVE REPORT   PROCEDURE PERFORMED:  Colonoscopy with biopsies.   ENDOSCOPIST:  Florencia Reasons, M.D.   INDICATIONS FOR PROCEDURE:  Intermittent bleeding in a 70 year old female  without prior screening.   FINDINGS:  Small proximal colonic polyps.  Right-sided diverticulosis.   DESCRIPTION OF PROCEDURE:  The nature, purpose and risks of the procedure  had been discussed with the patient, who provided written consent.  The  Olympus adjustable tension pediatric video colonoscope was advanced  with  some difficulty due to looping.  We actually ran out of scope but by taking  out loops, we were finally able to get the tip of the scope around the  hepatic flexure and then advanced all the way to the cecum as identified by  clear visualization of the appendiceal orifice.  Pullback was then  performed.  The quality of the prep was excellent and it is felt that all  areas were well seen.   There were two small sessile polyps, one in the cecum and one in the  proximal ascending colon, which I cold biopsied.  There was also some right-  sided diverticulosis.   No large polyps, cancer, colitis or vascular malformations were observed.  Retroflexion in the rectum was normal.  The patient  tolerated the procedure  well and there were no apparent complications.   IMPRESSION:  1. Small colonic polyps as noted above.  2. Mild right-sided diverticulosis.  3. No definite source for intermittent rectal bleeding observed, so it is     presumed to probably be of hemorrhoidal origin.   PLAN:  Await pathology on the polyps.   They were both under 5 mm in size.                                                 Florencia Reasons, M.D.    RVB/MEDQ  D:  07/02/2002  T:  07/03/2002  Job:  [id number]

## 2011-03-02 NOTE — Op Note (Signed)
NAME:  [name], [name]                             ACCOUNT NO.:  [id number]   MEDICAL RECORD NO.:  [id number]                   PATIENT TYPE:  AMB   LOCATION:  ENDO                                 FACILITY:  [facility]   PHYSICIAN:  Bernette Redbird, M.D.                DATE OF BIRTH:  Oct 13, 1941   DATE OF PROCEDURE:  09/08/2003  DATE OF DISCHARGE:  09/07/2003                                 OPERATIVE REPORT   PROCEDURE PERFORMED:  24 hour pH monitoring.   INDICATIONS FOR PROCEDURE:  Reflux symptoms despite Prevacid therapy in a  patient status post previous antireflux surgery.   FINDINGS:  Essentially normal study.   DESCRIPTION OF PROCEDURE:  The patient provided written consent for the  procedure.  Following performance of esophageal manometry, transnasal  passage of a pH probe was accomplished by the endoscopy nurse as an  outpatient at [facility] and the patient went home to return the  next day after approximately 24 hours of monitoring, after which time, the  data was downloaded.   FINDINGS:  1. Proximal esophagus.  Total reflux time was 0.4% with no episodes lasting     greater than five minutes and the longest episode lasting two minutes.     All of the reflux occurred while in the upright position.  2. Distal esophagus.  The distal esophagus had 1.9% total time in reflux,     predominantly in the upright posture with no episodes lasting longer than     five minutes.  The DeMeester score was 14, with normal being less than     22.   IMPRESSION:  I would consider this a normal study.  There are not norms for  the degree to which acid exposure in the proximal esophagus is considered  pathologic and some individuals would say that any reflux in the proximal  esophageal probe is abnormal.  Nonetheless, I would favor the interpretation  of this as being a study basically within normal limits, with the proviso  that it was performed while on medications.  It would support the  idea that  the patient's chest pain symptoms are not coming from significant reflux  episodes, although since this study only reflects a single 24 hour period,  it is not possible to deny the occurrence of significant reflux with  associated chest pain at other times.  The patient's diary did list several  episodes of chest pain, none of which correlated with episodes of reflux, so  it is felt likely that the patient's chest pain is not on a reflux basis.   RECOMMENDATIONS:  For now continue proton pump inhibitor therapy and  clinical follow-up of the patient in the office would be appropriate.  Bernette Redbird, M.D.    RB/MEDQ  D:  11/17/2003  T:  11/18/2003  Job:  [id number]

## 2011-03-02 NOTE — Op Note (Signed)
   NAME:  [name], [name]                             ACCOUNT NO.:  [id number]   MEDICAL RECORD NO.:  [id number]                   PATIENT TYPE:  AMB   LOCATION:  ENDO                                 FACILITY:  [facility]   PHYSICIAN:  Florencia Reasons, M.D.             DATE OF BIRTH:  Mar 13, 1941   DATE OF PROCEDURE:  07/02/2002  DATE OF DISCHARGE:                                 OPERATIVE REPORT   PROCEDURE:  Upper endoscopy with biopsies.   INDICATIONS FOR PROCEDURE:  Recurrent reflux symptoms now 3 1/2 years status  post lap Nissen.   FINDINGS:  Small hiatal hernia. No esophagitis. Small gastric polyps.   DESCRIPTION OF PROCEDURE:  The nature, purpose and risk of the procedure  were familiar to the patient from prior examination. She provided written  consent. Sedation for this procedure and the colonoscopy which followed it  totaled fentanyl 60 mcg and Versed 6 mg IV without arrhythmias or  desaturation. The Olympus video endoscope was passed under direct vision.  The vocal cords looked normal.   The esophagus was readily entered and had entirely normal mucosa without any  evidence of reflux esophagitis, reflux, Barrett's esophagus, varices,  infection, or neoplasia. There was no ring or stricture present but there  was a 2 cm hiatal hernia.   The stomach contained essentially no residual and had normal mucosa without  evidence of gastritis, erosions, ulcers or masses, although there were some  small benign appearing polyps along the greater curve which were biopsied  toward the end of the procedure and are presumed probably to be fundic gland  polyps related to PPI usage.   A retroflexed view of the proximal stomach showed what appeared to be an  intact wrap although there was a slightly patulous character to the  diaphragmatic hiatus, with a roughly 1 cm gap adjacent to the scope.   The pylorus, duodenal bulb, and second duodenum looked normal.   The patient tolerated the  procedure well and there were no apparent  complications.   IMPRESSION:  1. Wrap intact, but slight herniation in the stomach forming a small hiatal     hernia. Slightly patulous residual diaphragmatic hiatus.  2. No evidence of reflux esophagitis.  3. Small gastric polyps, biopsied.    PLAN:  1. Follow-up with Dr. Daphine Deutscher to discuss treatment alternatives.  2. Await pathology on gastric polyps.                                               Florencia Reasons, M.D.    RVB/MEDQ  D:  07/02/2002  T:  07/03/2002  Job:  [id number]   cc:   [place] Daphine Deutscher, M.D.  Fax: [phone]

## 2011-03-02 NOTE — H&P (Signed)
NAMESAMAR, VENNEMAN                   ACCOUNT NO.:  [id number]   MEDICAL RECORD NO.:  [id number]          PATIENT TYPE:  OBV   LOCATION:  0103                         [facility]ACILITY:  [facility]   PHYSICIAN:  Lonia Blood, M.D.      DATE OF BIRTH:  09/01/1941   DATE OF ADMISSION:  01/16/2005  DATE OF DISCHARGE:                                HISTORY & PHYSICAL   PRIMARY CARE PHYSICIAN:  Production assistant, radio.   CARDIOLOGIST:  Osvaldo Shipper. Spruill, M.D.   PRESENTING COMPLAINT:  Retrosternal chest pain x3 days.   HISTORY OF PRESENT ILLNESS:  This is a 70 year old African-American female  with history of hiatal hernia status post repair about 6 years ago. Also  previous episodes of chest pain, status post catheterization about 6 years  ago which was clean according to patient. The patient came in due to  escalated retrosternal chest pain for the past 3 days. The patient describes  it mainly as chest heaviness discomfort. The patient's blood pressure was  initially 90/50 at home and she just did not feel right. She however thought  she was okay and she even went to work. The pain however has persisted  today, hence she came to the emergency room. She denied any radiation. No  identifiable aggravating factors and no relieving factors.   PAST MEDICAL HISTORY:  1.  Hiatal hernia status post repair.  2.  Gastroesophageal reflux disease.  3.  Hypertension.  4.  Osteoporosis.   ALLERGIES:  DEMEROL, NAPROSYN. Demerol make her feel like she is another  world with hallucinations. Naprosyn cause her to have nausea and vomiting.   MEDICATIONS:  Prevacid 39 mg p.o. daily p.r.n.   SOCIAL HISTORY:  The patient is married and resides in [place] with her  husband. 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  Denied any tobacco or alcohol use.   FAMILY HISTORY:  Mother has significant angina that started in her early  51's. Father had diabetes, mother is also a diabetic.   REVIEW  OF SYSTEMS:  Essentially as in HPI. All systems reviewed and within  normal limits.   PHYSICAL EXAMINATION:  VITAL SIGNS:  Temperature is 97.4, blood pressure  initially 159/78, pulse 61, respiratory rate 18, saturations 98% on room  air.  GENERAL:  The patient is alert and oriented, seems to be in mild distress  due to pain.  HEENT:  Pupils are equal, round and reactive to light. EOMI.  NECK:  Supple, no JVD, no lymphadenopathy.  RESPIRATORY:  The patient has good air entry bilaterally and no wheezes or  rales.  CARDIOVASCULAR:  The patient has a regular rhythm, seems to be slightly  bradycardic.  ABDOMEN:  Is obese, full, nontender with positive bowel sounds.  EXTREMITIES:  Show no edema, cyanosis or clubbing.   LABORATORY DATA:  Showed a white count of 4400, hemoglobin 12.5, platelets  179,000, with a normal differential. Sodium was 136, potassium 3.8, chloride  103, CO2 28, glucose 130, BUN 15, creatinine 1.1, calcium 9.3, total protein  6.7, albumin 3.5. Initial  cardiac enzymes revealed myoglobin 77.3, CK-MB  1.7, troponin is less than 0.05.   Chest x-ray shows no acute cardiopulmonary disease. EKG shows sinus  bradycardia with a rate of 56. Normal intervals. Hyperacute T waves in the  lateral leads. Possible left ventricular hypertrophy findings.   ASSESSMENT:  This is a 70 year old female coming with atypical chest pain.  The patient's chest pain is likely to be secondary to a recurrence of her  hiatal hernia or gastroesophageal reflux disease based on the presentation.  However the patient has some risk factors for cardiac disease including  hypertension, unknown cholesterol status, and family history of coronary  artery disease although not at a very early age. The patient also has had a  prior cardiac work up which was said to have been negative. Being in her  27's may be at a slightly higher risk. With this in mind will admit the  patient for 23-hour observation.    PLAN:  1.  Will check cardiac enzymes serially to rule her out for myocardial      infarction. I will also give her sublingual nitroglycerin as well as IV      Morphine as needed for chest pain. In the meantime I will also give her      some proton pump inhibitor. If labs return as normal will probably      pursue a barium swallow to further characterize her previous hiatal      hernia although she has had surgery before. In the meantime the PPI      should be able to help if it is indeed gastroesophageal reflux disease.   1.  Gastroesophageal reflux disease. I will proceed as above with Protonix      40 mg daily. If needed we may change it to b.i.d. We may also consider      adding Reglan if needed. The patient is slightly nauseated and I will      give her Phenergan also to help control her nausea.      LG/MEDQ  D:  01/16/2005  T:  01/16/2005  Job:  [id number]   cc:   [facility]   Osvaldo Shipper. Spruill, M.D.  [address] [address]  [place]  [place] [address]  Fax: [phone]

## 2011-03-02 NOTE — Discharge Summary (Signed)
NAMEAMBERROSE, FRIEBEL                   ACCOUNT NO.:  [id number]   MEDICAL RECORD NO.:  [id number]          PATIENT TYPE:  INP   LOCATION:  1526                         FACILITY:  [facility]   PHYSICIAN:  Thorn[place] Park. Daphine Deutscher, MD  DATE OF BIRTH:  [birth year]   DATE OF ADMISSION:  08/20/2008  DATE OF DISCHARGE:  08/25/2008                               DISCHARGE SUMMARY   ADMITTING DIAGNOSIS:  Recurrent gastroesophageal reflux disease.  Herniation above previous Nissen fundoplication.   DISCHARGE DIAGNOSIS:  Recurrent gastroesophageal reflux disease.  Herniation above previous Nissen fundoplication.   PROCEDURE:  Laparoscopic takedown of Nissen fundoplication and herniated  stomach with endoscopy, resection of portion of cardia and hiatus  closure.   COURSE IN THE HOSPITAL:  Lou Irigoyen is a 70 year old lady who underwent  the above-mentioned operation.  Postoperatively she had esophagram which  showed esophagus was patent and no evidence of leak.  She came along  slowly, advancing slowly on clear liquids.  She was not ready to go home  until postop day #5.  At that time, she was given Tylox for pain.  She  was taking liquids in adequate amounts and having bowel movements,  seemed to be getting along fairly well.  Follow up in the office in 2 to  3 weeks.  Condition stable.      Th[place] Park Daphine Deutscher, MD  Electronically Signed     MBM/MEDQ  D:  09/23/2008  T:  09/23/2008  Job:  [id number]

## 2011-03-02 NOTE — Discharge Summary (Signed)
NAME:  [name], [name]                             ACCOUNT NO.:  [id number]   MEDICAL RECORD NO.:  [id number]                   PATIENT TYPE:  INP   LOCATION:  0454                                 FACILITY:  [facility]   PHYSICIAN:  Sherin Quarry, MD                   DATE OF BIRTH:  08-Aug-1941   DATE OF ADMISSION:  07/03/2003  DATE OF DISCHARGE:                                 DISCHARGE SUMMARY   [name] is a 70 year old lady with a past history that is remarkable for  chronic gastroesophageal reflux and previous Nissen fundoplication.  On the  afternoon prior to her presentation to the emergency room she experienced a  severe episode of substernal chest pain which seemed to radiate to the base  of her neck.  This was associated with pain in the left arm area which then  persisted for several hours.  There was associated nausea.  The patient  indicated that she had had several episodes of acid reflux since the Nissen  procedure.   Physical exam at the time of admission is described by Dr. Efraim Kaufmann L.  Ladona Ridgel.  The temperature was 98, blood pressure 139/67, pulse was 66,  respirations were 20, O2 saturation was 99%.  HEENT exam was within normal  limits.  The chest was clear.  Cardiovascular exam revealed normal S1 and  S2.  There are no rubs, murmurs, or gallops.  The abdomen was benign.  Minimal bowel sounds.  Without masses, tenderness, or organomegaly.  Neurological testing and examination of the extremities were normal.   Relevant laboratory studies obtained included CBC which revealed a white  count of 4000, hemoglobin was 13.1.  PT and PTT were within normal limits.  The d-dimer was slightly elevated at 0.74.  CMET was remarkable for  potassium of 3.7, a glucose of 113.  The liver profile was within normal  limits.  Serial cardiac enzymes were negative.  Of note was that the  cholesterol level was 312 with the LDL cholesterol 215.   In light of the positive d-dimer result, a  spiral CT scan of the chest was  done which showed no CT scan evidence of pulmonary emboli.  A chest x-ray  was within normal limits.   Echocardiogram was done, which showed a left ventricular ejection fraction  of 55-65% with no evidence of left ventricular hypertrophy.   Consultation was obtained from Dr. Shana Chute of the cardiology service, who  recommended that the patient undergo a Persantine Cardiolite study.  This  was done on July 07, 2003.  I discussed the results of the test with  nuclear medicine doctors at [facility], and the verbal report was that  the study was completely normal.  Therefore, on July 07, 2003, the  patient was discharged.   DISCHARGE DIAGNOSES:  1. Chest pain, noncardiac, possibly secondary to gastroesophageal reflux.  2. History  of gastroesophageal reflux, status post Nissen fundoplication.  3. Hyperlipidemia.  4. Transient hypertension.   DISCHARGE MEDICATIONS:  1. The patient will continue Prevacid in a dose of 30 mg b.i.d.  2. She will also take Zocor 40 mg daily at bedtime.  3. Aspirin 325 mg daily.   I encouraged her to follow up with Dr. Bayard Beaver. Bates at [facility], who is her primary care doctor according to the patient.   CONDITION AT THE TIME DISCHARGE:  Good.                                               Sherin Quarry, MD    SY/MEDQ  D:  07/07/2003  T:  07/08/2003  Job:  [id number]   cc:   [name], M.D.  [address]  [place]  [place] [address]  Fax: [phone]   Cheryl Shipper. Bates, M.D.  [address] [address]  [place]  [place] [address]  Fax: [phone]

## 2011-03-02 NOTE — Discharge Summary (Signed)
[name], [name]                   ACCOUNT NO.:  [id number]   MEDICAL RECORD NO.:  [id number]          PATIENT TYPE:  INP   LOCATION:  4733                         FACILITY:  [facility]   PHYSICIAN:  Beckey Rutter, MD  DATE OF BIRTH:  [birth year]   DATE OF ADMISSION:  08/14/2006  DATE OF DISCHARGE:  08/17/2006                                 DISCHARGE SUMMARY   CHIEF COMPLAINT ON ADMISSION:  Chest pain.   DISCHARGE DIAGNOSES:  1. Chest pain likely due to cardiac causes.  2. Right breast pain.  3. Elevated blood pressure.  4. Mild sinus bradycardia.  5. Gastroesophageal reflux disease.  6. Hiatus hernia.   DISCHARGE MEDICATIONS:  1. Aspirin 81 mg p.o. once a day.  2. Coreg 3.125 mg p.o. twice a day.  3. Colace 100 mg p.o. daily.  4. Prozac 200 mg p.o. daily.  5. Protonix 40 mg p.o. daily.   HOSPITAL COURSE:  Since admission, patient has been on telemetry.  She also  had troponin and cardiac enzyme which are negative for acute coronary  syndrome.  Although she experience another pain in the second day of  admission, but it is still looked atypical for cardiogenic origin.  And all  the chest pain is likely due to her hiatus hernia, and pervious GERD, and  gastrointestinal disease.  Now the patient stable. He will be discharged to  follow up with her primary physician for further assessment and treatment as  needed.  For GERD we are going to continue Protonix on discharge medication.  Medication added during this hospitalization is beta blocker.  Patient will  be discharged with Coreg 3.125 mg twice with aspirin.  During hospital stay  patient was complaining of deep right breast pain that further testing for  that might be needed.  Patient was instructed to follow up with mammogram  diagnosis and pain medication was prescribed.      Beckey Rutter, MD  Electronically Signed     EME/MEDQ  D:  08/17/2006  T:  08/18/2006  Job:  [id number]

## 2011-03-02 NOTE — Discharge Summary (Signed)
NAMESANAM, MARMO                   ACCOUNT NO.:  [id number]   MEDICAL RECORD NO.:  [id number]          PATIENT TYPE:  INP   LOCATION:  4706                         FACILITY:  [facility]   PHYSICIAN:  Danae Chen, M.D.DATE OF BIRTH:  1940-11-29   DATE OF ADMISSION:  01/16/2005  DATE OF DISCHARGE:  01/20/2005                                 DISCHARGE SUMMARY   PRIMARY CARE PHYSICIAN:  [facility].   CARDIOLOGIST:  Osvaldo Shipper. Spruill, M.D.   SURGEON:  [place]. Daphine Deutscher, M.D., [facility].   DISCHARGE DIAGNOSES:  1.  Atypical chest pain.  2.  Hiatal hernia.  3.  Reflux disease.  4.  Dyslipidemia.  5.  History of hypertension.  6.  History of osteoporosis.   DISCHARGE MEDICATIONS:  1.  The patient is to resume her home medications at prior doses.  2.  New medications to include:      1.  Crestor 10 mg p.o. daily.      2.  Proton pump inhibitor, Protonix, 40 mg p.o. b.i.d.      3.  Altace 2.5 mg p.o. daily.   FOLLOW UP:  1.  Th[place] Park Daphine Deutscher, M.D. as an outpatient at [facility].  2.  Osvaldo Shipper. Spruill, M.D. in 1-2 weeks to re-establish his new primary      cardiologist.  3.  [facility], his primary physician, in one week.   The patient is to call for these appointments as it is after hours at time  of discharge.   PROCEDURES:  Barium swallow/esophagogram showing no ulcers, no reflux,  status post fundoplication, recurrent hiatal hernia, done on January 17, 2005.   She also had a persantine Cardiolite on January 18, 2005, report pending at  time of discharge, will be added at time of discharge.   Right upper quadrant ultrasound also was performed. The results are pending  at the time of discharge.   BRIEF HOSPITAL COURSE:  The patient is a 70 year old African-American female  with cardiac risk factors including age, dyslipidemia, and hypertension who  presented with atypical chest pain.  She has had recurrent chest  discomfort  and chest tightness off and on for several weeks and was initially evaluated  when cardiac enzymes were checked, and these were negative  She also had a 2-  D echocardiogram performed which showed normal ejection fraction, normal  left ventricular size, and some mild diastolic dysfunction without any  significant change from a 2-D echocardiogram done two years ago.  The  patient did have a Nissen fundoplication six years ago for severe reflux  esophagitis, and she said this had helped tremendously at that time, and she  had been on an occasional proton pump inhibitor since then.  However, over  the last few weeks, she started having similar symptoms again.  Therefore, a  barium swallow was done, and the results are as above.  By hospital day #2,  she had felt better, but by hospital day #3, she said she felt worse.  She  had another episode of chest  pain that had been relieved by nitroglycerin.  Therefore, cardiology was consulted, and Dr. Shana Chute had recommended the  Cardiolite which was performed and read as above by Dr. Sharyn Lull.  Following  this, surgery did come and see the patient and had recommended an outpatient  workup for her symptoms once her cardiac workup was cleared as well.  At the  time of discharge, the patient was feeling better but still was having some  recurrent reflux and/or chest tightness-type symptoms but not associated  with any nausea or vomiting but felt significantly better at the time of  discharge.  She was normotenisve, blood pressure 132/63, pulse 56, and 95%  on room air saturations.  Lungs were clear.  Heart rate was regular.  Abdomen was soft, and she had no peripheral edema.   PERTINENT LABORATORY DATA:  Her cholesterol was done with a total  cholesterol of 265, triglycerides of 71, and LDL of 180. A statin was  initiated.  She said that she had not done well on Lipitor due to some  muscle cramping a couple of years ago, but we have started  Crestor, and she  will follow up her primary care physician and have her follow-up liver  function tests with her primary doctor as well.   At the time of discharge, a final report of the Cardiolite as well as the  right upper quadrant ultrasound should be put in place on the chart and any  pertinent discharge laboratory data noted at that time as well.      RLK/MEDQ  D:  01/19/2005  T:  01/20/2005  Job:  [id number]   cc:   [facility]   Osvaldo Shipper. Spruill, M.D.  [address] [address]  [place]  [place] [address]  Fax: [phone]   Thornton Park. Daphine Deutscher, MD  [address], [address]  [place]  [place] [address]

## 2011-03-02 NOTE — H&P (Signed)
NAME:  [name], [name]                             ACCOUNT NO.:  [id number]   MEDICAL RECORD NO.:  [id number]                   PATIENT TYPE:  INP   LOCATION:  0368                                 FACILITY:  [facility]   PHYSICIAN:  Melissa L. Ladona Ridgel, MD               DATE OF BIRTH:  15-Apr-1941   DATE OF ADMISSION:  07/03/2003  DATE OF DISCHARGE:                                HISTORY & PHYSICAL   CHIEF COMPLAINT:  Central chest pain.   HISTORY OF PRESENT ILLNESS:  The patient is a 70 year old African-American  female with a past medical history significant for hiatal hernia with GERD  status post fundoplication.  The afternoon prior to her emergency room  admission she experienced 2-3 minutes of substernal chest pain rated a 9/10  on the pain scale, which radiated to the base of her neck under her tongue,  and felt pain down the left arm which persisted into the day of admission as  a dull ache.  Her symptoms were associated with nausea, but no vomiting or  diaphoresis.  She denied any shortness of breath associated with the chest  pain.  In the past the patient states that she has had episodes where she  would wake up during the middle of the night with a panic feeling, but  states that she has never had chest pain or experienced shortness of breath  related to the symptoms.  She denies orthopnea or PND.  She relates that she  occasionally suffers from GERD after her fundoplication, but it is rare, it  generally occurs when she has not taken her Prevacid.  She relates that this  pain for which she is seeking help in the emergency room, is different from  her previous GERD pain.   PAST MEDICAL HISTORY:  1. Hiatal hernia.  2. Osteoporosis.   PAST SURGICAL HISTORY:  Fundoplication.   SOCIAL HISTORY:  She denies tobacco, ethanol.  States she is married with  seven children and works as a Occupational psychologist.   FAMILY HISTORY:  Her mother is living, but has angina.  Her  father is  deceased and his medical conditions are unknown.  She does have one daughter  who was recently diagnosed with breast cancer.   REVIEW OF SYSTEMS:  She denies weight loss or gain.  She states she is  having more frequent headaches over the past couple of months, but denies  any dizziness, syncope or weakness except for the weakness surrounding the  chest pain yesterday.  She states her appetite is fair, but that she is  generally not hungry for lunch.  She describes chest pain as per the HPI.  Denies shortness of breath, PND, orthopnea, fever, chills, nausea, vomiting,  or diarrhea.   PHYSICAL EXAMINATION:  VITAL SIGNS:  Temperature 98, blood pressure 139/67,  pulse 50-66, respiratory rate 18-20 with saturation of 99% on  room air.  GENERAL:  This is a pleasant African-American female looking younger than  her stated age, in no acute distress.  HEENT:  Normocephalic and atraumatic.  Pupils equal, round and reactive to  light.  She is anicteric.  Her extraocular muscles are intact.  Mucous  membranes are moist.  NECK:  Supple.  There is no JVD, no bruit, no thyromegaly, and 2+ carotid  upstrokes are present.  CHEST:  Clear to auscultation with mildly decreased left base.  CARDIOVASCULAR:  Bradycardia with crisp S1, S2.  There is no S3 or S4.  No  murmurs, rubs or gallops, however there is a possible snap heard at the left  sternal border.  ABDOMEN:  Soft, nontender, nondistended with positive bowel sounds.  There  is no hepatosplenomegaly.  She has several well-healed scars consistent with  her fundoplication.  EXTREMITIES:  No cyanosis, clubbing or edema.  She has 2+ femoral, radial  and DP pulses.  NEUROLOGIC:  Cranial nerves II-XII are intact.  She is awake, alert or  oriented x3.  Extremity power is 5/5 with deep tendon reflexes at 2+,  plantars are downgoing.  Her sensation is intact.   LABORATORY DATA:  WBC 4.0, hemoglobin 13.1, hematocrit 38.9, platelets 157.  Basic  metabolic panel reveals a potassium of 3.7 and creatinine 0.9.  Glucose is mildly elevated at 113.  Her first set of cardiac enzymes are  negative with a CK of 179 and a troponin of less than 0.01.  Portable chest  x-ray completed in the emergency room are negative with only comment of some  cardiomegaly.  EKG is normal sinus rhythm without ST-T wave changes.  There  is mild left ventricular hypertrophy by QRS criteria.   ASSESSMENT AND PLAN:  This is a 70 year old African-American female with one  episode of atypical chest pain.  She will be admitted to telemetry to rule  out myocardial infarction.   Chest pain responsive to nitroglycerin, question whether this is cardiac in  origin versus gastrointestinal.  She is currently bradycardic which as per  her history is baseline.  We will hold beta blockade at this time secondary  to her bradycardia down in the 50's.  She was treated in the emergency room  with aspirin which will be continued, and at this time we will not start  Lovenox.  She will have serial enzymes and a repeat EKG now that she is pain-  free, and a D-Dimer will be checked.  If the D-Dimer is increased we will  entertain starting Lovenox and perhaps obtaining a CT scan of her chest with  pulmonary embolus protocol.                                               Melissa L. Ladona Ridgel, MD    MLT/MEDQ  D:  07/04/2003  T:  07/04/2003  Job:  [id number]

## 2011-03-02 NOTE — Op Note (Signed)
NAME:  [name], [name]                             ACCOUNT NO.:  [id number]   MEDICAL RECORD NO.:  [id number]                   PATIENT TYPE:  AMB   LOCATION:  ENDO                                 FACILITY:  [facility]   PHYSICIAN:  Bernette Redbird, M.D.                DATE OF BIRTH:  [birth year]   DATE OF PROCEDURE:  09/07/2003  DATE OF DISCHARGE:  09/07/2003                                 OPERATIVE REPORT   PROCEDURE PERFORMED:  Esophageal manometry.   INDICATIONS FOR PROCEDURE:  The patient is four years status post antireflux  surgery and has had problems with reflux symptoms from chest pain as well as  occasional dysphagia.   FINDINGS:  Moderately severe esophageal dysmotility.   The patient provided written consent for the procedure.  It was performed as  an outpatient by the endoscopy nurse at the [facility] endoscopy unit using  a transnasal solid state manometry catheter.   FINDINGS:  1. Lower esophageal sphincter.  The lower esophageal sphincter had a     measured length of 5 cm, a resting pressure of 10.5 mmHg which is low     normal (normal is 10 to 45 mmHg) and fairly good relaxation of 83%.  2. Esophageal body.  The esophageal body may have been compromised by     dirty data but there were numerous simultaneous and nontransmitted     contractions noted and on my inspection of the tracings, it did indeed     appear that there were multiple simultaneous contractions.  Amplitudes     were on the low side in the lower esophagus with amplitudes measured at     25 and 28 mm (normal  30 to 180).  Durations were normal.  3. Upper esophageal sphincter.  The upper esophageal sphincter had high     normal pressure of 121 mmHg but good relaxation and coordination with     pharyngeal contractions.   IMPRESSION:  1. 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  2. Low esophageal amplitudes.  3. Disordered peristalsis, characterized primarily by simultaneous  contractions.   PLAN:  Proceed to 24 hour pH monitoring.                                               Bernette Redbird, M.D.    RB/MEDQ  D:  11/17/2003  T:  11/18/2003  Job:  [id number]

## 2011-03-19 ENCOUNTER — Ambulatory Visit: Payer: Medicare Other | Admitting: Internal Medicine

## 2011-05-07 ENCOUNTER — Encounter: Payer: Self-pay | Admitting: Internal Medicine

## 2011-05-07 ENCOUNTER — Ambulatory Visit (INDEPENDENT_AMBULATORY_CARE_PROVIDER_SITE_OTHER): Payer: BC Managed Care – PPO | Admitting: Internal Medicine

## 2011-05-07 DIAGNOSIS — K219 Gastro-esophageal reflux disease without esophagitis: Secondary | ICD-10-CM

## 2011-05-07 DIAGNOSIS — Z1211 Encounter for screening for malignant neoplasm of colon: Secondary | ICD-10-CM

## 2011-05-07 DIAGNOSIS — Z86010 Personal history of colonic polyps: Secondary | ICD-10-CM

## 2011-05-07 MED ORDER — PEG-KCL-NACL-NASULF-NA ASC-C 100 G PO SOLR: 1.0000 | Freq: Once | ORAL | Status: DC

## 2011-05-07 NOTE — Progress Notes (Signed)
[name] 1941/07/10 MRN [id number]    History of Present Illness:  This is an 70 year old African American female with severe, intractable gastroesophageal reflux. Her last office visit was in March 2012. She is also here to set up a recall colonoscopy. Her last colon exam in 2003 showed a tubular adenoma. She had a Nissen fundoplication in 2002, and in 2006 had a recurrence of her reflux due to the stomach slipping into the chest from her fundoplication coming undone. She had a surgical takedown of the fundoplication in 2006 and she has since been on high-dose antacids and PPIs. Her last upper endoscopy in January 2011 showed a 4 cm the hernia with fundic gland polyps. A barium esophagram in 2006 and in March 2012 showed normal motility and normal passage of a 13 mm tablet as well as a moderate sized hiatal hernia with mild reflux. She complains of hoarseness and coughing at night. She also complains of fluid and acid running up into her mouth at night. She saw Dr. Daphine Deutscher who was interested in redoing her Nissen fundoplication. She, at that time, was afraid that it could get undone again. Her insurance does not cover Zegrid which is the only PPI that has helped.   Past Medical History  Diagnosis Date  . GERD (gastroesophageal reflux disease)     subsequent Nissen Fundoplication  . Fibromyalgia   . Hyperlipidemia   . Depression   . Degenerative joint disease   . Osteoporosis   . Esophageal dysmotility   . Arthritis   . Glaucoma   . Sleep apnea   . Hiatal hernia 11/08/09  . Diverticulosis 2003  . Hx of adenomatous colonic polyps 07/02/02  . Chronic back pain    Past Surgical History  Procedure Date  . Nissen fundoplication 2000    with subsequent takedown in 2009  . Gastric resection 2009    reports that she has never smoked. She has never used smokeless tobacco. She reports that she does not drink alcohol or use illicit drugs. family history includes Breast cancer in her daughter;  Diabetes in her father and mother; and Heart disease in her mother.  There is no history of Colon cancer. Allergies  Allergen Reactions  . Delacort (Hydrocortisone Base)   . Meperidine Hcl     demerol  . Naproxen         Review of Systems: Denies dysphagia, chest pain. Positive for hoarseness and coughing at night. Nice abdominal pain, positive. Sternal chest pain  The remainder of the 10  point ROS is negative except as outlined in H&P   Physical Exam: General appearance  Well developed, in no distress. Eyes- non icteric. HEENT nontraumatic, normocephalic. Mouth no lesions, tongue papillated, no cheilosis. Neck supple without adenopathy, thyroid not enlarged, no carotid bruits, no JVD. Lungs Clear to auscultation bilaterally. Cor normal S1 normal S2, regular rhythm , no murmur,  quiet precordium. Abdomen soft minimally tender in epigastrium. Normal active bowel sounds. Rectal: Not done. Extremities no pedal edema. Skin no lesions. Neurological alert and oriented x 3. Psychological normal mood and affect.  Assessment and Plan:  Problem #1 Intractable gastroesophageal reflux. Patient is status post failed Nissen fundoplication. She is status post take down of the fundoplication. She is a candidate for redo and she may need another approach; possibly thoracic. For now, I gave her samples of Dexilant 60 mg for her to take twice a day and I also want her to try AcipHex 20 mg twice a day.  She will take antacids at bedtime and after meals and follow antireflux measures. She has tried Reglan in the past without success.  Problem #2 Colorectal screening. We will schedule a recall colonoscopy. She had an adenomatous polyp in 2003.  05/07/2011 Lina Sar

## 2011-05-07 NOTE — Patient Instructions (Addendum)
You have been scheduled for a colonoscopy. Please follow written instructions given to you at your visit today.  Please pick up your Moviprep kit at the pharmacy within the next 2-3 days. Dr Baxter Kail , Dr Maryelizabeth Rowan

## 2011-05-08 ENCOUNTER — Encounter: Payer: Self-pay | Admitting: Internal Medicine

## 2011-05-08 ENCOUNTER — Ambulatory Visit (AMBULATORY_SURGERY_CENTER): Payer: Medicare Other | Admitting: Internal Medicine

## 2011-05-08 DIAGNOSIS — K644 Residual hemorrhoidal skin tags: Secondary | ICD-10-CM

## 2011-05-08 DIAGNOSIS — K5730 Diverticulosis of large intestine without perforation or abscess without bleeding: Secondary | ICD-10-CM

## 2011-05-08 DIAGNOSIS — K648 Other hemorrhoids: Secondary | ICD-10-CM

## 2011-05-08 DIAGNOSIS — Z860100 Personal history of colon polyps, unspecified: Secondary | ICD-10-CM

## 2011-05-08 DIAGNOSIS — Z1211 Encounter for screening for malignant neoplasm of colon: Secondary | ICD-10-CM

## 2011-05-08 MED ORDER — SODIUM CHLORIDE 0.9 % IV SOLN: 500.0000 mL | INTRAVENOUS | Status: DC

## 2011-05-08 NOTE — Patient Instructions (Signed)
You have mild diverticulosis and hemorrhoids.   Please use the over the counter annusol suppositories without cortisone due to your allergy.  Use one every night before bed.    Try to increase the fiber in your diet.    Read the handouts given to you by your recovery room nurse.   Resume your routine medications today.  If you have any questions, please call us at [phone].  Thank-you.

## 2011-05-09 ENCOUNTER — Telehealth: Payer: Self-pay

## 2011-05-09 NOTE — Telephone Encounter (Signed)
No id answering machine 

## 2011-05-15 ENCOUNTER — Telehealth: Payer: Self-pay | Admitting: Internal Medicine

## 2011-05-15 MED ORDER — MENTHOL-ZINC OXIDE 0.44-20.625 % EX OINT: TOPICAL_OINTMENT | CUTANEOUS | Status: DC

## 2011-05-15 NOTE — Telephone Encounter (Signed)
Patient has an allergy to Naproxen and Canasa is contraindicated for this.

## 2011-05-15 NOTE — Telephone Encounter (Signed)
Per Dr. Juanda Chance, try Calmoseptine ointment. Samples for patient left up front for pick. Patient to pick up today or tomorrow.

## 2011-05-15 NOTE — Telephone Encounter (Signed)
Patient calling to report she is having rectal pain. Last night, it kept her awake. She is using Preparation H supp and cream at night. She is feeling a hard protrusion at rectum. States her stools are soft and denies straining. She states after her colonoscopy she had rectal bleeding for 2 days then it stopped.[name] She will try doing sitz baths and Tucks. She is using baby wipes.  Patient is allergic to hydrocortisone. Any other suggestions?

## 2011-05-15 NOTE — Telephone Encounter (Signed)
Canasa supp 1000mg , #15, 1 per rectum bid

## 2011-05-16 ENCOUNTER — Telehealth: Payer: Self-pay | Admitting: Internal Medicine

## 2011-05-16 MED ORDER — ESOMEPRAZOLE MAGNESIUM 40 MG PO CPDR: DELAYED_RELEASE_CAPSULE | ORAL | Status: DC

## 2011-05-16 NOTE — Telephone Encounter (Signed)
reviewed and agree. 

## 2011-05-16 NOTE — Telephone Encounter (Signed)
Patient states the ointment has not helped at all and the hard knot at the rectum is very painful. She has scheduled with OV on 05/18/11 with Dr. Juanda Chance. Suggested to patient that she go to urgent care if the pain is severe and she cannot wait.

## 2011-05-18 ENCOUNTER — Ambulatory Visit (INDEPENDENT_AMBULATORY_CARE_PROVIDER_SITE_OTHER): Payer: Medicare Other | Admitting: Internal Medicine

## 2011-05-18 ENCOUNTER — Encounter: Payer: Self-pay | Admitting: Internal Medicine

## 2011-05-18 VITALS — BP 132/74 | HR 80 | Ht 71.0 in | Wt 194.0 lb

## 2011-05-18 DIAGNOSIS — K645 Perianal venous thrombosis: Secondary | ICD-10-CM

## 2011-05-18 DIAGNOSIS — K649 Unspecified hemorrhoids: Secondary | ICD-10-CM

## 2011-05-18 DIAGNOSIS — K2270 Barrett's esophagus without dysplasia: Secondary | ICD-10-CM

## 2011-05-18 MED ORDER — DEXLANSOPRAZOLE 60 MG PO CPDR: 60.0000 mg | DELAYED_RELEASE_CAPSULE | Freq: Two times a day (BID) | ORAL | Status: DC

## 2011-05-18 NOTE — Progress Notes (Signed)
.   [name] 09-11-1941 MRN [id number]     History of Present Illness:  This is a 70 year old African American female who had a colonoscopy one week ago with findings of hemorrhoids. She had an adenomatous polyp in 2003. After the colonoscopy, she developed rectal pain, swelling and protrusion of the hemorrhoid. It has not bothered her for one week and is finally improving as a result of the Calmoseptine ointment. She has had chronic symptomatic hemorrhoids; some of them external, associated with bleeding. She also has intractable gastroesophageal reflux for which she is status post Nissen fundoplication in 2003. She had a takedown of the fundoplication in 2006 with recurrent gastroesophageal reflux. She was started on Dexilant which seems to be controlling her reflux better than the prior PPI. She would like to have it refilled.   Past Medical History  Diagnosis Date  . GERD (gastroesophageal reflux disease)     subsequent Nissen Fundoplication  . Hyperlipidemia   . Depression   . Degenerative joint disease   . Osteoporosis   . Arthritis   . Glaucoma   . Sleep apnea   . Diverticulosis 2003  . Hx of adenomatous colonic polyps 07/02/02  . Chronic back pain   . Fibromyalgia   . Esophageal dysmotility   . Hiatal hernia 11/08/09  . Diverticulosis   . Hemorrhoids    Past Surgical History  Procedure Date  . Nissen fundoplication 2000    with subsequent takedown in 2009  . Gastric resection 2009    reports that she has never smoked. She has never used smokeless tobacco. She reports that she does not drink alcohol or use illicit drugs. family history includes Breast cancer in her daughter; Diabetes in her father and mother; and Heart disease in her mother.  There is no history of Colon cancer. Allergies  Allergen Reactions  . Delacort (Hydrocortisone Base)   . Meperidine Hcl     demerol  . Naproxen         Review of Systems:Acid reflux improved. Denies dysphagia. Denies  chest pain or shortness of breath  The remainder of the 10  point ROS is negative except as outlined in H&P   Physical Exam: General appearance  Well developed, in no distress. Eyes- non icteric. HEENT nontraumatic, normocephalic. Mouth no lesions, tongue papillated, no cheilosis. Neck supple without adenopathy, thyroid not enlarged, no carotid bruits, no JVD. Lungs Clear to auscultation bilaterally. Cor normal S1 normal S2, regular rhythm , no murmur,  quiet precordium. Abdomen Soft nontender abdomen with normal active bowel sounds. Rectal Large thrombosed hemorrhoid 2 cm in diameter. Very tender. No active bleeding. Could not be reduced. Extremities no pedal edema. Skin no lesions. Neurological alert and oriented x 3. Psychological normal mood and affect.  Assessment and Plan:  Problem #1Thrombosed hemorrhoid. Patient has a history of internal and external hemorrhoids with failed medical therapy. We will refer her for surgical consideration. She is to continue current medical treatment until then.  Problem #2 Gastroesophageal reflux disease. We may need to consider a repeat antireflux procedure. She needs a followup with Dr. Daphine Deutscher to consider redo of her antireflux procedure. In the meantime, she is to continue on Dexilant 60 mg twice a day and anti-reflux measures.   05/18/2011 Lina Sar

## 2011-05-18 NOTE — Patient Instructions (Addendum)
You have been scheduled for an appointment with Dr Sheron Nightingale at [facility]. Your appointment is on 06/21/11 at 9:20 am. Please arrive at 8:50 am for registration. Make certain to bring a list of current medications, including any over the counter medications or vitamins. Also bring your co-pay if you have one as well as your insurance cards. [facility] is located at [address], [address]. Should you need to reschedule your appointment, please contact them at [phone]. We have sent the following medications to your pharmacy for you to pick up at your convenience: Dexilant 60 mg. Take 1 tablet by mouth twice daily in place of Nexium CC: Dr Daphine Deutscher

## 2011-05-28 ENCOUNTER — Telehealth: Payer: Self-pay | Admitting: *Deleted

## 2011-05-28 DIAGNOSIS — K648 Other hemorrhoids: Secondary | ICD-10-CM | POA: Insufficient documentation

## 2011-05-28 NOTE — Telephone Encounter (Signed)
Try Prilosec 40 mg po bid, #60, 10 refills and Zantac 15o mg in the middle of the day, #30, 6 refills

## 2011-05-28 NOTE — Telephone Encounter (Signed)
Dr Juanda Chance, I have attempted to complete a prior authorization for patient's Dexilant. However, her insurance company states that they will only cover pantoprazole or prilosec (even though I told them she has tried Nexium, Prilosec,  Zantac and pantoprazole without relief). They state that no prior authorization can be completed. Dr Juanda Chance, any suggestions?

## 2011-05-28 NOTE — Telephone Encounter (Signed)
Patient calling to see if [facility] has sent information from her insurance company about the denial to cover Dexilant. Dottie has not received anything. [facility] was to fax over information. Spoke with [facility] and they will try faxing to [phone].

## 2011-05-28 NOTE — Telephone Encounter (Signed)
Received fax.

## 2011-05-29 NOTE — Telephone Encounter (Signed)
Left voicemail for patient to call back. 

## 2011-05-30 MED ORDER — OMEPRAZOLE 40 MG PO CPDR: 40.0000 mg | DELAYED_RELEASE_CAPSULE | Freq: Two times a day (BID) | ORAL | Status: DC

## 2011-05-30 MED ORDER — RANITIDINE HCL 150 MG PO TABS: ORAL_TABLET | ORAL | Status: DC

## 2011-05-30 NOTE — Telephone Encounter (Signed)
New phone [phone])---- I have spoken to patient and she verbalizes understanding that her Dexilant will not be covered by insurance. She is agreeable to the plan that we will start her on Prilosec twice daily and Zantac midday. She will contact us if this regimen does not relieve her symptoms.

## 2011-06-04 ENCOUNTER — Emergency Department (HOSPITAL_COMMUNITY)
Admission: EM | Admit: 2011-06-04 | Discharge: 2011-06-05 | Disposition: A | Discharge status: Home / Self Care | Payer: Medicare Other | Attending: Emergency Medicine | Admitting: Emergency Medicine

## 2011-06-04 DIAGNOSIS — R109 Unspecified abdominal pain: Secondary | ICD-10-CM | POA: Insufficient documentation

## 2011-06-04 DIAGNOSIS — K5730 Diverticulosis of large intestine without perforation or abscess without bleeding: Secondary | ICD-10-CM | POA: Insufficient documentation

## 2011-06-04 DIAGNOSIS — R112 Nausea with vomiting, unspecified: Secondary | ICD-10-CM | POA: Insufficient documentation

## 2011-06-05 ENCOUNTER — Telehealth: Payer: Self-pay | Admitting: *Deleted

## 2011-06-05 ENCOUNTER — Encounter (HOSPITAL_COMMUNITY): Payer: Self-pay | Admitting: Radiology

## 2011-06-05 ENCOUNTER — Emergency Department (HOSPITAL_COMMUNITY): Payer: Medicare Other

## 2011-06-05 LAB — COMPREHENSIVE METABOLIC PANEL
ALT: 13 U/L (ref 0–35)
BUN: 16 mg/dL (ref 6–23)
CO2: 33 mEq/L — ABNORMAL HIGH (ref 19–32)
Calcium: 10.7 mg/dL — ABNORMAL HIGH (ref 8.4–10.5)
Creatinine, Ser: 0.91 mg/dL (ref 0.50–1.10)
GFR calc Af Amer: >60 mL/min (ref >60)
GFR calc non Af Amer: >60 mL/min (ref >60)
Glucose, Bld: 116 mg/dL — ABNORMAL HIGH (ref 70–99)
Sodium: 140 mEq/L (ref 135–145)
Total Protein: 8.4 g/dL — ABNORMAL HIGH (ref 6.0–8.3)

## 2011-06-05 LAB — DIFFERENTIAL
Lymphocytes Relative: 33 % (ref 12–46)
Lymphs Abs: 2.6 10*3/uL (ref 0.7–4.0)
Monocytes Absolute: 0.4 10*3/uL (ref 0.1–1.0)
Monocytes Relative: 5 % (ref 3–12)
Neutro Abs: 4.7 10*3/uL (ref 1.7–7.7)
Neutrophils Relative %: 60 % (ref 43–77)

## 2011-06-05 LAB — CBC
HCT: 45.1 % (ref 36.0–46.0)
Hemoglobin: 15.1 g/dL — ABNORMAL HIGH (ref 12.0–15.0)
MCH: 31.7 pg (ref 26.0–34.0)
MCHC: 33.5 g/dL (ref 30.0–36.0)
MCV: 94.7 fL (ref 78.0–100.0)
RBC: 4.76 MIL/uL (ref 3.87–5.11)

## 2011-06-05 LAB — LIPASE, BLOOD: Lipase: 19 U/L (ref 11–59)

## 2011-06-05 MED ORDER — IOHEXOL 300 MG/ML  SOLN
100.0000 mL | Freq: Once | INTRAMUSCULAR | Status: AC | PRN
  Administered 2011-06-05: 100 mL via INTRAVENOUS

## 2011-06-05 NOTE — Telephone Encounter (Signed)
I agree

## 2011-06-05 NOTE — Telephone Encounter (Signed)
Spoke with patient. She has spent the last 12 hours in the ER. She reports she has had nausea, vomiting and abdominal pain x 4 days. She went to the ER and they did an xray and put an NG tube down because they thought she had a blockage. Then they did CT and it was negative- no small bowel obstruction. They removed the NG and sent her home with Zofran and Norco. Patient states she is still vomiting anything she puts down. She is going to call Dr. Ermalene Searing office to see if she can move up her appointment with him re: surgical revision of Nissen Fundoplication(as per ER recommendations) Please, advise.

## 2011-06-15 ENCOUNTER — Encounter (INDEPENDENT_AMBULATORY_CARE_PROVIDER_SITE_OTHER): Payer: Self-pay | Admitting: General Surgery

## 2011-06-21 ENCOUNTER — Ambulatory Visit (INDEPENDENT_AMBULATORY_CARE_PROVIDER_SITE_OTHER): Payer: Medicare Other | Admitting: Surgery

## 2011-06-21 ENCOUNTER — Encounter (INDEPENDENT_AMBULATORY_CARE_PROVIDER_SITE_OTHER): Payer: Self-pay | Admitting: Surgery

## 2011-06-21 DIAGNOSIS — K649 Unspecified hemorrhoids: Secondary | ICD-10-CM

## 2011-06-21 DIAGNOSIS — K219 Gastro-esophageal reflux disease without esophagitis: Secondary | ICD-10-CM

## 2011-06-21 NOTE — Progress Notes (Signed)
The following note was sent to [facility] to try to obtain coverage for Dexilant  Thursday, June 21, 2011  Re: ANTONIO WOODHAMS  1941/02/21  To: [employer] Administ[employer]  Mrs. Kattner has been a patient of mine since February of 2000 when I saw her for gastroesophageal reflux disease. She had a very significant history of nocturnal choking and strangling as well as pain with reflux. She underwent a Nissen fundoplication in 2001. She had some straining and work related injury that contributed to a breakdown and  reherniation of her Nissen in 2009 and we took  her back to the operating room for a repair of her Nissen fundoplication.  This was a complicated redo and something to be avoided if at all possible in the future.  Currently the only medication that alleviates her symptoms is Dexilant 60 mg BID.  This has been prescribed to her by her gastroenterologist, Dr. Lina Sar.  She indicated that your insurance plan would not cover this medication and the patch work of PPIs and H2 blockers that she is currently using did not work. Therefore we are facing either reoperation or coverage for her Dexilant.  I would ask that you please reconsider your denial of this medication and allow Mrs. Alridge to stay on Dexilant as prescribed. Otherwise, we are facing repeat surgery with a much higher risk of life threatening complications and expenses.  If you have any questions about this request please don't hesitate to call me.   Sincerely,    Matt B. Daphine Deutscher, MD, FACS   We need to try to treat her medically if possible. If this does not work then we are facing repeat surgery which carries with it a higher risk. I explained this to her and her husband.  As a separate matter I examined her hemorrhoids. She also showed me a picture of what her hemorrhoids were thrombosed. Currently they have gone down and or not the main problem that she has. She would like to have her gastroesophageal reflux issues  addressed as a priority. I will see her back in 4 weeks to see how well we'll make an out on the Dexilant prescription refill.

## 2011-07-17 LAB — CBC
HCT: 37.3
Hemoglobin: 11.4 — ABNORMAL LOW
Hemoglobin: 12.3
Hemoglobin: 13.2
MCHC: 32.8
MCHC: 33
MCV: 95.1
Platelets: 186
RBC: 3.65 — ABNORMAL LOW
RBC: 3.88
RDW: 14.2
RDW: 14.3
WBC: 5

## 2011-07-17 LAB — DIFFERENTIAL
Basophils Relative: 0
Eosinophils Absolute: 0.1
Eosinophils Relative: 2
Monocytes Absolute: 0.5
Monocytes Relative: 8

## 2011-07-17 LAB — BASIC METABOLIC PANEL
Calcium: 9.5
GFR calc non Af Amer: >60
Glucose, Bld: 100 — ABNORMAL HIGH
Potassium: 3.8
Sodium: 138

## 2011-07-27 ENCOUNTER — Ambulatory Visit (INDEPENDENT_AMBULATORY_CARE_PROVIDER_SITE_OTHER): Payer: Medicare Other | Admitting: Surgery

## 2011-07-31 ENCOUNTER — Encounter (INDEPENDENT_AMBULATORY_CARE_PROVIDER_SITE_OTHER): Payer: Self-pay | Admitting: Surgery

## 2011-08-01 ENCOUNTER — Ambulatory Visit (INDEPENDENT_AMBULATORY_CARE_PROVIDER_SITE_OTHER): Payer: Medicare Other | Admitting: Surgery

## 2011-08-07 ENCOUNTER — Encounter (INDEPENDENT_AMBULATORY_CARE_PROVIDER_SITE_OTHER): Payer: Self-pay | Admitting: Surgery

## 2011-08-07 ENCOUNTER — Ambulatory Visit (INDEPENDENT_AMBULATORY_CARE_PROVIDER_SITE_OTHER): Payer: Medicare Other | Admitting: Surgery

## 2011-08-07 VITALS — BP 132/74 | HR 60 | Temp 97.0°F | Resp 16 | Ht 70.0 in | Wt 191.0 lb

## 2011-08-07 DIAGNOSIS — K219 Gastro-esophageal reflux disease without esophagitis: Secondary | ICD-10-CM

## 2011-08-07 NOTE — Progress Notes (Signed)
[name] came in today and I shared with him the letter from [place] [employer]  That effectively denied them coverage for Dexilant.  She is currently taking ranitidine and protonix.  I asked her if she had taken Reglan and she said she had taken in the past. I explained the side effects including tardive dyskinesia.  She was to try this since it is covered under the [employer] health plan. They requested a 90 day supply.  They are aware that surgery has increased risk since this would be redo.  Will go ahead and trial and medication see her back in 6 weeks.

## 2011-09-18 ENCOUNTER — Other Ambulatory Visit: Payer: Self-pay | Admitting: Podiatrist

## 2011-09-18 DIAGNOSIS — M79673 Pain in unspecified foot: Secondary | ICD-10-CM

## 2011-09-19 ENCOUNTER — Ambulatory Visit
Admission: RE | Admit: 2011-09-19 | Discharge: 2011-09-19 | Disposition: A | Discharge status: Home / Self Care | Payer: Medicare Other | Source: Ambulatory Visit | Attending: Podiatrist | Admitting: Podiatrist

## 2011-09-19 DIAGNOSIS — M79673 Pain in unspecified foot: Secondary | ICD-10-CM

## 2011-09-28 ENCOUNTER — Encounter (INDEPENDENT_AMBULATORY_CARE_PROVIDER_SITE_OTHER): Payer: Medicare Other | Admitting: Surgery

## 2011-10-24 DIAGNOSIS — M722 Plantar fascial fibromatosis: Secondary | ICD-10-CM | POA: Diagnosis not present

## 2011-10-25 DIAGNOSIS — R0989 Other specified symptoms and signs involving the circulatory and respiratory systems: Secondary | ICD-10-CM | POA: Diagnosis not present

## 2011-10-25 DIAGNOSIS — M79609 Pain in unspecified limb: Secondary | ICD-10-CM | POA: Diagnosis not present

## 2011-10-25 DIAGNOSIS — R220 Localized swelling, mass and lump, head: Secondary | ICD-10-CM | POA: Diagnosis not present

## 2011-10-25 DIAGNOSIS — R221 Localized swelling, mass and lump, neck: Secondary | ICD-10-CM | POA: Diagnosis not present

## 2011-10-26 DIAGNOSIS — I658 Occlusion and stenosis of other precerebral arteries: Secondary | ICD-10-CM | POA: Diagnosis not present

## 2011-10-26 DIAGNOSIS — R220 Localized swelling, mass and lump, head: Secondary | ICD-10-CM | POA: Diagnosis not present

## 2011-10-29 DIAGNOSIS — E041 Nontoxic single thyroid nodule: Secondary | ICD-10-CM | POA: Insufficient documentation

## 2011-11-14 DIAGNOSIS — M722 Plantar fascial fibromatosis: Secondary | ICD-10-CM | POA: Diagnosis not present

## 2011-12-20 DIAGNOSIS — M7750 Other enthesopathy of unspecified foot: Secondary | ICD-10-CM | POA: Diagnosis not present

## 2011-12-20 DIAGNOSIS — M722 Plantar fascial fibromatosis: Secondary | ICD-10-CM | POA: Diagnosis not present

## 2011-12-21 DIAGNOSIS — Z79899 Other long term (current) drug therapy: Secondary | ICD-10-CM | POA: Diagnosis not present

## 2011-12-21 DIAGNOSIS — M79609 Pain in unspecified limb: Secondary | ICD-10-CM | POA: Diagnosis not present

## 2011-12-21 DIAGNOSIS — E785 Hyperlipidemia, unspecified: Secondary | ICD-10-CM | POA: Diagnosis not present

## 2011-12-25 DIAGNOSIS — R6889 Other general symptoms and signs: Secondary | ICD-10-CM | POA: Diagnosis not present

## 2011-12-26 DIAGNOSIS — M722 Plantar fascial fibromatosis: Secondary | ICD-10-CM | POA: Diagnosis not present

## 2011-12-26 DIAGNOSIS — R269 Unspecified abnormalities of gait and mobility: Secondary | ICD-10-CM | POA: Diagnosis not present

## 2011-12-31 DIAGNOSIS — R269 Unspecified abnormalities of gait and mobility: Secondary | ICD-10-CM | POA: Diagnosis not present

## 2011-12-31 DIAGNOSIS — M722 Plantar fascial fibromatosis: Secondary | ICD-10-CM | POA: Diagnosis not present

## 2012-01-07 DIAGNOSIS — M722 Plantar fascial fibromatosis: Secondary | ICD-10-CM | POA: Diagnosis not present

## 2012-01-07 DIAGNOSIS — R269 Unspecified abnormalities of gait and mobility: Secondary | ICD-10-CM | POA: Diagnosis not present

## 2012-01-09 DIAGNOSIS — R269 Unspecified abnormalities of gait and mobility: Secondary | ICD-10-CM | POA: Diagnosis not present

## 2012-01-09 DIAGNOSIS — M722 Plantar fascial fibromatosis: Secondary | ICD-10-CM | POA: Diagnosis not present

## 2012-01-14 DIAGNOSIS — M722 Plantar fascial fibromatosis: Secondary | ICD-10-CM | POA: Diagnosis not present

## 2012-01-14 DIAGNOSIS — R269 Unspecified abnormalities of gait and mobility: Secondary | ICD-10-CM | POA: Diagnosis not present

## 2012-01-16 DIAGNOSIS — M722 Plantar fascial fibromatosis: Secondary | ICD-10-CM | POA: Diagnosis not present

## 2012-01-16 DIAGNOSIS — R269 Unspecified abnormalities of gait and mobility: Secondary | ICD-10-CM | POA: Diagnosis not present

## 2012-01-21 DIAGNOSIS — M722 Plantar fascial fibromatosis: Secondary | ICD-10-CM | POA: Diagnosis not present

## 2012-01-21 DIAGNOSIS — R269 Unspecified abnormalities of gait and mobility: Secondary | ICD-10-CM | POA: Diagnosis not present

## 2012-01-23 DIAGNOSIS — R269 Unspecified abnormalities of gait and mobility: Secondary | ICD-10-CM | POA: Diagnosis not present

## 2012-01-23 DIAGNOSIS — M722 Plantar fascial fibromatosis: Secondary | ICD-10-CM | POA: Diagnosis not present

## 2012-01-28 DIAGNOSIS — M722 Plantar fascial fibromatosis: Secondary | ICD-10-CM | POA: Diagnosis not present

## 2012-01-28 DIAGNOSIS — R269 Unspecified abnormalities of gait and mobility: Secondary | ICD-10-CM | POA: Diagnosis not present

## 2012-01-30 DIAGNOSIS — R269 Unspecified abnormalities of gait and mobility: Secondary | ICD-10-CM | POA: Diagnosis not present

## 2012-01-30 DIAGNOSIS — M722 Plantar fascial fibromatosis: Secondary | ICD-10-CM | POA: Diagnosis not present

## 2012-02-04 DIAGNOSIS — M722 Plantar fascial fibromatosis: Secondary | ICD-10-CM | POA: Diagnosis not present

## 2012-02-04 DIAGNOSIS — R269 Unspecified abnormalities of gait and mobility: Secondary | ICD-10-CM | POA: Diagnosis not present

## 2012-02-05 DIAGNOSIS — M722 Plantar fascial fibromatosis: Secondary | ICD-10-CM | POA: Diagnosis not present

## 2012-02-05 DIAGNOSIS — R350 Frequency of micturition: Secondary | ICD-10-CM | POA: Diagnosis not present

## 2012-02-05 DIAGNOSIS — R10819 Abdominal tenderness, unspecified site: Secondary | ICD-10-CM | POA: Diagnosis not present

## 2012-02-05 DIAGNOSIS — R82998 Other abnormal findings in urine: Secondary | ICD-10-CM | POA: Diagnosis not present

## 2012-02-05 DIAGNOSIS — M79609 Pain in unspecified limb: Secondary | ICD-10-CM | POA: Diagnosis not present

## 2012-02-05 DIAGNOSIS — R3919 Other difficulties with micturition: Secondary | ICD-10-CM | POA: Diagnosis not present

## 2012-02-05 DIAGNOSIS — D696 Thrombocytopenia, unspecified: Secondary | ICD-10-CM | POA: Diagnosis not present

## 2012-02-06 DIAGNOSIS — M722 Plantar fascial fibromatosis: Secondary | ICD-10-CM | POA: Diagnosis not present

## 2012-02-06 DIAGNOSIS — R269 Unspecified abnormalities of gait and mobility: Secondary | ICD-10-CM | POA: Diagnosis not present

## 2012-02-13 ENCOUNTER — Telehealth: Payer: Self-pay | Admitting: Oncology

## 2012-02-13 NOTE — Telephone Encounter (Signed)
called pt and scheduled new pt appt for 05/06.  faxed over a letter to Dr. Daphine Deutscher with appt d/t

## 2012-02-14 ENCOUNTER — Encounter: Payer: Self-pay | Admitting: Oncology

## 2012-02-14 DIAGNOSIS — R778 Other specified abnormalities of plasma proteins: Secondary | ICD-10-CM | POA: Insufficient documentation

## 2012-02-14 DIAGNOSIS — D696 Thrombocytopenia, unspecified: Secondary | ICD-10-CM | POA: Insufficient documentation

## 2012-02-18 ENCOUNTER — Telehealth: Payer: Self-pay | Admitting: Oncology

## 2012-02-18 ENCOUNTER — Other Ambulatory Visit (HOSPITAL_BASED_OUTPATIENT_CLINIC_OR_DEPARTMENT_OTHER): Payer: Medicare Other | Admitting: Lab

## 2012-02-18 ENCOUNTER — Ambulatory Visit (HOSPITAL_BASED_OUTPATIENT_CLINIC_OR_DEPARTMENT_OTHER): Payer: Medicare Other | Admitting: Oncology

## 2012-02-18 ENCOUNTER — Encounter: Payer: Self-pay | Admitting: Oncology

## 2012-02-18 ENCOUNTER — Ambulatory Visit: Payer: Medicare Other

## 2012-02-18 VITALS — BP 136/68 | HR 63 | Temp 98.4°F | Ht 68.0 in | Wt 186.2 lb

## 2012-02-18 DIAGNOSIS — D696 Thrombocytopenia, unspecified: Secondary | ICD-10-CM | POA: Diagnosis not present

## 2012-02-18 DIAGNOSIS — R799 Abnormal finding of blood chemistry, unspecified: Secondary | ICD-10-CM | POA: Diagnosis not present

## 2012-02-18 DIAGNOSIS — M810 Age-related osteoporosis without current pathological fracture: Secondary | ICD-10-CM

## 2012-02-18 DIAGNOSIS — R778 Other specified abnormalities of plasma proteins: Secondary | ICD-10-CM

## 2012-02-18 LAB — CBC WITH DIFFERENTIAL/PLATELET
BASO%: 0.6 % (ref 0.0–2.0)
Basophils Absolute: 0 10*3/uL (ref 0.0–0.1)
EOS%: 3 % (ref 0.0–7.0)
MCH: 31.9 pg (ref 25.1–34.0)
MCHC: 32.7 g/dL (ref 31.5–36.0)
MCV: 97.6 fL (ref 79.5–101.0)
MONO%: 7 % (ref 0.0–14.0)
RBC: 4.16 10*6/uL (ref 3.70–5.45)
RDW: 13 % (ref 11.2–14.5)
nRBC: 0 % (ref 0–0)

## 2012-02-18 LAB — COMPREHENSIVE METABOLIC PANEL
Alkaline Phosphatase: 62 U/L (ref 39–117)
Glucose, Bld: 88 mg/dL (ref 70–99)
Sodium: 140 mEq/L (ref 135–145)
Total Bilirubin: 0.3 mg/dL (ref 0.3–1.2)
Total Protein: 7.2 g/dL (ref 6.0–8.3)

## 2012-02-18 NOTE — Patient Instructions (Addendum)
1.  Issue:  Low platelet count (thrombocytopenia): - Resolved today. - Possible etiology:  Medication induced.  I have low suspicion for a bone marrow problem since your blood counts are fine today. - I sent for blood tests to rule out other less common causes of low platelet count.  No further invasivework up is needed at this time.   2.  Follow up: - Blood check here at the Cancer Center in about 4 months. - Follow up with my nurse practitioner Belenda Cruise in about 8 months.

## 2012-02-18 NOTE — Progress Notes (Signed)
[facility]  [phone] [phone]     INITIAL HEMATOLOGY CONSULTATION    Referral MD:   Dr. Maryelizabeth Rowan, M.D.  C/o Ms. Cheryl Kluver, FNP.    Reason for Referral:  Thrombocytopenia.     HPI:  Mrs. [name] is a 71 year-old [place] American woman with numerous medical issues.  She has had chronic thrombocytopenia dating back as far as 08/22/2008 when her WBC 6.6; Hgb 11.4; Plt 128.  She has had normal platelet counts in the interval.  On 12/25/2011, her WBC was 4.6; Hgb 13.3; plt 112.  Given thrombocytopenia, she was kindly referred to the [facility] for evaluation.  [name] presented to the Cancer Center for the first time today with her husband.  She denies spontaneous bleeding.  She has hemorrhoids and intermittent hematochezia and has had screening colonoscopy that were reportedly negative in the past.   She has bruises on the extensor surfaces of her arms and legs where she bumps into furniture.  She had history of hiatal hernia repairs twice in the past and fundoplication without bleeding complications.  She has chronic diffuse myalgia/arhthralgia from her diagnosis of fibromyalgia.  She has intermittent abdominal pain and nausea/vomiting from what she thought due to GERD.  She had a negative CT abdomen in 05/2011.  She has chronic fatigue; however, she is independent of activities of daily living.  She has 26 grand kids; and she helps her husband to take care of many of them.   Patient denies headache, visual changes, confusion, drenching night sweats, palpable lymph node swelling, mucositis, odynophagia, dysphagia, jaundice, chest pain, palpitation, shortness of breath, dyspnea on exertion, productive cough, gum bleeding, epistaxis, hematemesis, hemoptysis, abdominal swelling, early satiety, melena, hematuria, skin rash, spontaneous bleeding, joint swelling, joint pain, heat or cold intolerance, bowel bladder incontinence, back  pain, focal motor weakness, paresthesia, depression, suicidal or homocidal ideation, feeling hopelessness.     Past Medical History  Diagnosis Date  . GERD (gastroesophageal reflux disease)     subsequent Nissen Fundoplication  . Hyperlipidemia   . Depression   . Degenerative joint disease   . Osteoporosis   . Arthritis   . Glaucoma   . Sleep apnea   . Diverticulosis 2003  . Hx of adenomatous colonic polyps 07/02/02  . Chronic back pain   . Fibromyalgia   . Esophageal dysmotility   . Hiatal hernia 11/08/09  . Hemorrhoids   . Hypertension   . Hearing loss   . Rectal bleeding     from hemorrhoids.    . Anal or rectal pain     sometimes  . Nausea   . Thrombocytopenia   . Elevated total protein   . Hypercalcemia   :    Past Surgical History  Procedure Date  . Nissen fundoplication 2000    with subsequent takedown in 2009  . Gastric resection 2009  :   CURRENT MEDS: Current Outpatient Prescriptions  Medication Sig Dispense Refill  . calcium carbonate (TUMS - DOSED IN MG ELEMENTAL CALCIUM) 500 MG chewable tablet Chew 1 tablet by mouth as needed.      . ergocalciferol (VITAMIN D2) 50000 UNITS capsule Take 1 tablet on Sundays and Wednesdays (twice per week)       . HYDROcodone-acetaminophen (VICODIN) 5-500 MG per tablet Take 1 tablet by mouth every 6 (six) hours as needed.        [name] ibuprofen (ADVIL,MOTRIN) 200 MG tablet Take 200 mg by mouth  every 8 (eight) hours as needed.        . NON FORMULARY [place] (dietary supplement)      . phenylephrine-shark liver oil-mineral oil-petrolatum (PREP-HEM) 0.25-3-14-71.9 % rectal ointment Place 1 application rectally 2 (two) times daily as needed.      . rosuvastatin (CRESTOR) 40 MG tablet Take 40 mg by mouth daily.       [name] zolpidem (AMBIEN) 10 MG tablet Take 10 mg by mouth at bedtime as needed.         Current Facility-Administered Medications  Medication Dose Route Frequency Provider Last Rate Last Dose  . DISCONTD: 0.9 %   sodium chloride infusion  500 mL Intravenous Continuous Hart Carwin, MD          Allergies  Allergen Reactions  . Delacort (Hydrocortisone Base)   . Meperidine Hcl     Demerol causes hallucinations  . Naproxen Nausea And Vomiting  :  Family History  Problem Relation Age of Onset  . Heart disease Mother   . Diabetes Father   . Breast cancer Daughter   . Cancer Daughter      breast  . Colon cancer Neg Hx   . Cancer Maternal Aunt     stomach  . Cancer Maternal Uncle     leukemia  . Cancer Maternal Uncle     prostate  :  History   Social History  . Marital Status: Married    Spouse Name: N/A    Number of Children: N/A  . Years of Education: N/A   Occupational History  .      retired Clinical biochemist.    Social History Main Topics  . Smoking status: Never Smoker   . Smokeless tobacco: Never Used  . Alcohol Use: No  . Drug Use: No  . Sexually Active: Not on file   Other Topics Concern  . Not on file   Social History Narrative   Husband, [name] is Next of Kin. Cell # [phone]  :  REVIEW OF SYSTEM:  The rest of the 14-point review of sytem was negative.   Exam: ECOG 1.   General:  well-nourished woman, in no acute distress.  Eyes:  no scleral icterus.  ENT:  There were no oropharyngeal lesions.  Neck was without thyromegaly.  Lymphatics:  Negative cervical, supraclavicular or axillary adenopathy.  Respiratory: lungs were clear bilaterally without wheezing or crackles.  Cardiovascular:  Regular rate and rhythm, S1/S2, without murmur, rub or gallop.  There was no pedal edema.  GI:  abdomen was soft, flat, nontender, nondistended, without organomegaly.  Muscoloskeletal:  no spinal tenderness of palpation of vertebral spine.  Skin exam was without echymosis, petichae.  Neuro exam was nonfocal.  Patient was able to get on and off exam table without assistance.  Gait was normal.  Patient was alerted and oriented.  Attention was good.   Language was appropriate.   Mood was normal without depression.  Speech was not pressured.  Thought content was not tangential.    LABS:  Lab Results  Component Value Date   WBC 5.3 02/18/2012   HGB 13.3 02/18/2012   HCT 40.6 02/18/2012   PLT 149 02/18/2012   GLUCOSE 116* 06/05/2011   ALT 13 06/05/2011   AST 20 06/05/2011   NA 140 06/05/2011   K 4.6 06/05/2011   CL 101 06/05/2011   CREATININE 0.91 06/05/2011   BUN 16 06/05/2011   CO2 33* 06/05/2011   INR 0.92 12/20/2010   HGBA1C  Value: 6.3 (NOTE)                                                                       According to the ADA Clinical Practice Recommendations for 2011, when HbA1c is used as a screening test:   >=6.5%   Diagnostic of Diabetes Mellitus           (if abnormal result  is confirmed)  5.7-6.4%   Increased risk of developing Diabetes Mellitus  References:Diagnosis and Classification of Diabetes Mellitus,Diabetes Care,2011,34(Suppl 1):S62-S69 and Standards of Medical Care in         Diabetes - 2011,Diabetes Care,2011,34  (Suppl 1):S11-S61.* 12/20/2010     Blood smear review:   I personally reviewed the patient's peripheral blood smear today.  There was isocytosis.  There was no peripheral blast.  There was no schistocytosis, spherocytosis, target cell, rouleaux formation, tear drop cell.  There was rate giant platelets but no platelet clumps.      ASSESSMENT AND PLAN:  1.  Osteoporosis: she is on calcium/Vit D per PCP.   2.  VitD deficiency:  She is taking VitD per PCP.   3.  Fibromyalgia:  She is on ibuprofen and Vicodin prn per PCP.   4.  Hyperlipidemia:  She is on Crestor per PCP.   5.  Thrombocytopenia:  - Assessment:  This has been a chronic, intermittent issue since 2009.  It is now normal. - Differential:  Most likely due to medication-induced (Crestor, past use of Zantac) vs mild intermittent ITP.  I have low clinical suspicion for bone marrow failure process since her CBC today is normal. - Recommendation:  Return to the Cancer Center for CBC  in in about 4 months.  Follow up with my NP Belenda Cruise in about 8 months.  6.  Hypercalcemia; elevated total protein: - Differential diagnosis:  Plasma cell dyscrasia (myeloma vs. Amyloidosis). - Work up:  I sent for SPEP, serum light chain to screen for these diagnosis.  I spent time with the patient and her husband explaining about myeloma.  I have low suspicion at this time.  However, if prelim work up suggestive plasma cell dyscrasia, she will need diagnostic bone marrow biopsy.  She wanted be done by IR for sedation if needed.   7.  Nausea/vomiting, abdominal pain:  - unclear etiology.  Extensive work up with GI and Gen Surg have ben negative.  In the future, if she develops abdominal pain again, I may consider work up for other etiologies (PNH or porphyria).    Thank you for this referral.    The length of time of the face-to-face encounter was . More than 50% of time was spent counseling and coordination of care.

## 2012-02-18 NOTE — Telephone Encounter (Signed)
appts made and printed for pt aom °

## 2012-02-18 NOTE — Progress Notes (Signed)
Patient came in today as a new patient with her husband,she has two insurance.I did explain to her our programs we offer here at the cancer center,cop-pay assistance for medication and co-pay assistance for chemo drugs,she said oh kay she would let us know so I gave her [place] card.

## 2012-02-20 LAB — KAPPA/LAMBDA LIGHT CHAINS: Kappa:Lambda Ratio: 1.23 (ref 0.26–1.65)

## 2012-02-20 LAB — PROTEIN ELECTROPHORESIS, SERUM
Albumin ELP: 58.2 % (ref 55.8–66.1)
Beta 2: 5 % (ref 3.2–6.5)
Beta Globulin: 5.7 % (ref 4.7–7.2)

## 2012-04-11 DIAGNOSIS — E559 Vitamin D deficiency, unspecified: Secondary | ICD-10-CM | POA: Diagnosis not present

## 2012-04-11 DIAGNOSIS — E785 Hyperlipidemia, unspecified: Secondary | ICD-10-CM | POA: Diagnosis not present

## 2012-04-11 DIAGNOSIS — R82998 Other abnormal findings in urine: Secondary | ICD-10-CM | POA: Diagnosis not present

## 2012-04-11 DIAGNOSIS — Z79899 Other long term (current) drug therapy: Secondary | ICD-10-CM | POA: Diagnosis not present

## 2012-04-11 DIAGNOSIS — N271 Small kidney, bilateral: Secondary | ICD-10-CM | POA: Diagnosis not present

## 2012-04-11 DIAGNOSIS — R109 Unspecified abdominal pain: Secondary | ICD-10-CM | POA: Diagnosis not present

## 2012-04-25 DIAGNOSIS — E042 Nontoxic multinodular goiter: Secondary | ICD-10-CM | POA: Diagnosis not present

## 2012-04-28 DIAGNOSIS — D691 Qualitative platelet defects: Secondary | ICD-10-CM | POA: Diagnosis not present

## 2012-05-01 ENCOUNTER — Encounter (INDEPENDENT_AMBULATORY_CARE_PROVIDER_SITE_OTHER): Payer: Self-pay

## 2012-05-05 DIAGNOSIS — R109 Unspecified abdominal pain: Secondary | ICD-10-CM | POA: Diagnosis not present

## 2012-05-05 DIAGNOSIS — R300 Dysuria: Secondary | ICD-10-CM | POA: Diagnosis not present

## 2012-05-05 DIAGNOSIS — R1031 Right lower quadrant pain: Secondary | ICD-10-CM | POA: Diagnosis not present

## 2012-05-06 DIAGNOSIS — R1031 Right lower quadrant pain: Secondary | ICD-10-CM | POA: Diagnosis not present

## 2012-05-19 DIAGNOSIS — M1710 Unilateral primary osteoarthritis, unspecified knee: Secondary | ICD-10-CM | POA: Diagnosis not present

## 2012-05-20 DIAGNOSIS — M779 Enthesopathy, unspecified: Secondary | ICD-10-CM | POA: Diagnosis not present

## 2012-05-20 DIAGNOSIS — M7750 Other enthesopathy of unspecified foot: Secondary | ICD-10-CM | POA: Diagnosis not present

## 2012-05-27 DIAGNOSIS — N279 Small kidney, unspecified: Secondary | ICD-10-CM | POA: Diagnosis not present

## 2012-05-27 DIAGNOSIS — M545 Low back pain, unspecified: Secondary | ICD-10-CM | POA: Diagnosis not present

## 2012-06-11 ENCOUNTER — Encounter (INDEPENDENT_AMBULATORY_CARE_PROVIDER_SITE_OTHER): Payer: Self-pay | Admitting: Surgery

## 2012-06-11 ENCOUNTER — Ambulatory Visit (INDEPENDENT_AMBULATORY_CARE_PROVIDER_SITE_OTHER): Payer: 59 | Admitting: Surgery

## 2012-06-11 VITALS — BP 128/78 | HR 84 | Temp 97.6°F | Resp 16 | Ht 70.0 in | Wt 185.0 lb

## 2012-06-11 DIAGNOSIS — E041 Nontoxic single thyroid nodule: Secondary | ICD-10-CM | POA: Diagnosis not present

## 2012-06-11 NOTE — Progress Notes (Signed)
Chief Complaint:  Right thyroid nodule imaged at [facility].    History of Present Illness:  [name] is an 71 y.o. female who presents for evaluation of a right thyroid mass.  She comes with a disc from [facility] showing this mixed echo nodule.  It is not sore and she is not hoarse.     Past Medical History  Diagnosis Date  . GERD (gastroesophageal reflux disease)     subsequent Nissen Fundoplication  . Hyperlipidemia   . Depression   . Degenerative joint disease   . Osteoporosis   . Arthritis   . Glaucoma   . Sleep apnea   . Diverticulosis 2003  . Hx of adenomatous colonic polyps 07/02/02  . Chronic back pain   . Fibromyalgia   . Esophageal dysmotility   . Hiatal hernia 11/08/09  . Hemorrhoids   . Hypertension   . Hearing loss   . Rectal bleeding     from hemorrhoids.    . Anal or rectal pain     sometimes  . Nausea   . Thrombocytopenia   . Elevated total protein   . Hypercalcemia     Past Surgical History  Procedure Date  . Nissen fundoplication 2000    with subsequent takedown in 2009  . Gastric resection 2009    Current Outpatient Prescriptions  Medication Sig Dispense Refill  . calcium carbonate (TUMS - DOSED IN MG ELEMENTAL CALCIUM) 500 MG chewable tablet Chew 1 tablet by mouth as needed.      . ergocalciferol (VITAMIN D2) 50000 UNITS capsule Take 1 tablet on Sundays and Wednesdays (twice per week)       . HYDROcodone-acetaminophen (VICODIN) 5-500 MG per tablet Take 1 tablet by mouth every 6 (six) hours as needed.        [name] ibuprofen (ADVIL,MOTRIN) 200 MG tablet Take 200 mg by mouth every 8 (eight) hours as needed.        . NON FORMULARY [place] (dietary supplement)      . rosuvastatin (CRESTOR) 40 MG tablet Take 40 mg by mouth daily.       . phenylephrine-shark liver oil-mineral oil-petrolatum (PREP-HEM) 0.25-3-14-71.9 % rectal ointment Place 1 application rectally 2 (two) times daily as needed.      . zolpidem (AMBIEN) 10 MG tablet Take 10 mg by  mouth at bedtime as needed.         Delacort; Meperidine hcl; and Naproxen Family History  Problem Relation Age of Onset  . Heart disease Mother   . Diabetes Father   . Breast cancer Daughter   . Cancer Daughter      breast  . Colon cancer Neg Hx   . Cancer Maternal Aunt     stomach  . Cancer Maternal Uncle     leukemia  . Cancer Maternal Uncle     prostate   Social History:   reports that she has never smoked. She has never used smokeless tobacco. She reports that she does not drink alcohol or use illicit drugs.   REVIEW OF SYSTEMS - PERTINENT POSITIVES ONLY: Non contributory  Physical Exam:   Blood pressure 128/78, pulse 84, temperature 97.6 F (36.4 C), temperature source Temporal, resp. rate 16, height 5\' 10"  (1.778 m), weight 185 lb (83.915 kg). Body mass index is 26.54 kg/(m^2).  Gen:  WDWN AAF NAD  Neurological: Alert and oriented to person, place, and time. Motor and sensory function is grossly intact  Head: Normocephalic and atraumatic.  Eyes: Conjunctivae  are normal. Pupils are equal, round, and reactive to light. No scleral icterus.  Neck: Normal range of motion. Neck supple. No tracheal deviation .  Small nodule in right thyroid lobe.    Cardiovascular:  SR without murmurs or gallops.  No carotid bruits Respiratory: Effort normal.  No respiratory distress. No chest wall tenderness. Breath sounds normal.  No wheezes, rales or rhonchi.  Abdomen:  nontender GU: Musculoskeletal: Normal range of motion. Extremities are nontender. No cyanosis, edema or clubbing noted Lymphadenopathy: No cervical, preauricular, postauricular or axillary adenopathy is present Skin: Skin is warm and dry. No rash noted. No diaphoresis. No erythema. No pallor. Pscyh: Normal mood and affect. Behavior is normal. Judgment and thought content normal.   LABORATORY RESULTS: No results found for this or any previous visit (from the past 48 hour(s)).  RADIOLOGY RESULTS: No results  found.  Problem List: Patient Active Problem List  Diagnosis  . HYPERLIPIDEMIA  . DEPRESSION  . HEMORRHOIDS  . REFLUX ESOPHAGITIS  . GERD  . HIATAL HERNIA  . DIVERTICULOSIS, COLON  . CONSTIPATION  . HIP PAIN, CHRONIC  . BACK PAIN, CHRONIC  . FIBROMYALGIA  . OSTEOPOROSIS  . SLEEP APNEA  . DYSPHAGIA  . COLONIC POLYPS, ADENOMATOUS, HX OF  . GASTRIC POLYP, HX OF  . Thrombocytopenia  . Elevated total protein  . Hypercalcemia    Assessment & Plan: Right thyroid nodule.  Needs needle aspirate and followup by me to review path.      Matt B. Daphine Deutscher, MD, [facility] [phone] beeper [phone]  06/11/2012 4:56 PM

## 2012-06-11 NOTE — Addendum Note (Signed)
Addended byLiliana Cline on: 06/11/2012 05:03 PM   Modules accepted: Orders

## 2012-06-11 NOTE — Patient Instructions (Addendum)
See Dr. Daphine Deutscher back after needle biopsy of thyroid.

## 2012-06-17 ENCOUNTER — Telehealth (INDEPENDENT_AMBULATORY_CARE_PROVIDER_SITE_OTHER): Payer: Self-pay | Admitting: General Surgery

## 2012-06-17 DIAGNOSIS — E041 Nontoxic single thyroid nodule: Secondary | ICD-10-CM

## 2012-06-17 NOTE — Telephone Encounter (Signed)
Called patient and left message for her to call me back. Dr Denny Levy reviewed ultrasound of her thyroid and her nodules are not large enough to warrant a biopsy. Dr Daphine Deutscher made aware and stated it was okay to cancel biopsy and order 6 month follow up thyroid ultrasound. To make patient aware when she calls back. Left message with interventional radiology to cancel US biopsy. To call back with any questions.

## 2012-06-17 NOTE — Telephone Encounter (Signed)
Spoke with patient. Aware we will cancel biopsy and set up 6 month repeat ultrasound.

## 2012-06-20 ENCOUNTER — Other Ambulatory Visit: Payer: Self-pay | Admitting: Lab

## 2012-06-20 DIAGNOSIS — D696 Thrombocytopenia, unspecified: Secondary | ICD-10-CM

## 2012-06-20 DIAGNOSIS — R778 Other specified abnormalities of plasma proteins: Secondary | ICD-10-CM

## 2012-06-20 LAB — CBC WITH DIFFERENTIAL/PLATELET
Basophils Absolute: 0 10*3/uL (ref 0.0–0.1)
Eosinophils Absolute: 0.2 10*3/uL (ref 0.0–0.5)
HCT: 37.8 % (ref 34.8–46.6)
HGB: 12.7 g/dL (ref 11.6–15.9)
MCH: 32.2 pg (ref 25.1–34.0)
MONO#: 0.5 10*3/uL (ref 0.1–0.9)
NEUT%: 41.8 % (ref 38.4–76.8)
WBC: 5.2 10*3/uL (ref 3.9–10.3)
lymph#: 2.3 10*3/uL (ref 0.9–3.3)

## 2012-06-25 DIAGNOSIS — M25569 Pain in unspecified knee: Secondary | ICD-10-CM | POA: Diagnosis not present

## 2012-06-27 DIAGNOSIS — M779 Enthesopathy, unspecified: Secondary | ICD-10-CM | POA: Diagnosis not present

## 2012-06-27 DIAGNOSIS — M19079 Primary osteoarthritis, unspecified ankle and foot: Secondary | ICD-10-CM | POA: Diagnosis not present

## 2012-06-27 DIAGNOSIS — M722 Plantar fascial fibromatosis: Secondary | ICD-10-CM | POA: Diagnosis not present

## 2012-06-27 DIAGNOSIS — M79609 Pain in unspecified limb: Secondary | ICD-10-CM | POA: Diagnosis not present

## 2012-07-23 DIAGNOSIS — Z79899 Other long term (current) drug therapy: Secondary | ICD-10-CM | POA: Diagnosis not present

## 2012-07-23 DIAGNOSIS — E785 Hyperlipidemia, unspecified: Secondary | ICD-10-CM | POA: Diagnosis not present

## 2012-07-23 DIAGNOSIS — M1990 Unspecified osteoarthritis, unspecified site: Secondary | ICD-10-CM | POA: Diagnosis not present

## 2012-07-24 DIAGNOSIS — R809 Proteinuria, unspecified: Secondary | ICD-10-CM | POA: Diagnosis not present

## 2012-07-25 ENCOUNTER — Telehealth (INDEPENDENT_AMBULATORY_CARE_PROVIDER_SITE_OTHER): Payer: Self-pay | Admitting: General Surgery

## 2012-07-25 NOTE — Telephone Encounter (Signed)
LMOM letting pt know that her next [place] for thyroid will be on 10/27/12 at 11:15 at [address].

## 2012-07-28 DIAGNOSIS — R809 Proteinuria, unspecified: Secondary | ICD-10-CM | POA: Diagnosis not present

## 2012-07-30 DIAGNOSIS — S93609A Unspecified sprain of unspecified foot, initial encounter: Secondary | ICD-10-CM | POA: Diagnosis not present

## 2012-10-23 DIAGNOSIS — R809 Proteinuria, unspecified: Secondary | ICD-10-CM | POA: Diagnosis not present

## 2012-10-23 DIAGNOSIS — I10 Essential (primary) hypertension: Secondary | ICD-10-CM | POA: Diagnosis not present

## 2012-10-24 ENCOUNTER — Other Ambulatory Visit (HOSPITAL_BASED_OUTPATIENT_CLINIC_OR_DEPARTMENT_OTHER): Payer: Medicare Other | Admitting: Lab

## 2012-10-24 ENCOUNTER — Ambulatory Visit (HOSPITAL_BASED_OUTPATIENT_CLINIC_OR_DEPARTMENT_OTHER): Payer: Medicare Other | Admitting: Oncology

## 2012-10-24 VITALS — BP 150/68 | HR 56 | Temp 98.6°F | Resp 18 | Ht 70.0 in | Wt 190.8 lb

## 2012-10-24 DIAGNOSIS — D696 Thrombocytopenia, unspecified: Secondary | ICD-10-CM

## 2012-10-24 DIAGNOSIS — R778 Other specified abnormalities of plasma proteins: Secondary | ICD-10-CM

## 2012-10-24 LAB — CBC WITH DIFFERENTIAL/PLATELET
Basophils Absolute: 0 10*3/uL (ref 0.0–0.1)
EOS%: 4.2 % (ref 0.0–7.0)
HCT: 38.2 % (ref 34.8–46.6)
HGB: 13.1 g/dL (ref 11.6–15.9)
MCH: 32.1 pg (ref 25.1–34.0)
MCV: 93.8 fL (ref 79.5–101.0)
MONO%: 9.5 % (ref 0.0–14.0)
NEUT%: 37.9 % — ABNORMAL LOW (ref 38.4–76.8)

## 2012-10-24 LAB — COMPREHENSIVE METABOLIC PANEL (CC13)
ALT: 13 U/L (ref 0–55)
AST: 21 U/L (ref 5–34)
Albumin: 3.5 g/dL (ref 3.5–5.0)
BUN: 23 mg/dL (ref 7.0–26.0)
Calcium: 9.7 mg/dL (ref 8.4–10.4)
Chloride: 105 mEq/L (ref 98–107)
Potassium: 4.8 mEq/L (ref 3.5–5.1)

## 2012-10-24 NOTE — Progress Notes (Signed)
[facility]  Telephone:([phone] [phone]   OFFICE PROGRESS NOTE   Cc:  [name],ELIZABETH, MD  DIAGNOSIS:  Mild, chronic thrombocytopenia, NOS.   CURRENT THERAPY: watchful observation.   INTERVAL HISTORY: [name] 72 y.o. female returns for regular follow up with her husband.  She has right foot pain from ortho issue.  She has diffuse myalgia, arthralgia but no significant joint swelling.  She denied any visible source of bleeding.  She has mild fatigue; however, she is independent of activities of daily living.   Patient denies fever, anorexia, weight loss, headache, visual changes, confusion, drenching night sweats, palpable lymph node swelling, mucositis, odynophagia, dysphagia, nausea vomiting, jaundice, chest pain, palpitation, shortness of breath, dyspnea on exertion, productive cough, gum bleeding, epistaxis, hematemesis, hemoptysis, abdominal pain, abdominal swelling, early satiety, melena, hematochezia, hematuria, skin rash, spontaneous bleeding,  heat or cold intolerance, bowel bladder incontinence, focal motor weakness, paresthesia, depression.   Past Medical History  Diagnosis Date  . GERD (gastroesophageal reflux disease)     subsequent Nissen Fundoplication  . Hyperlipidemia   . Depression   . Degenerative joint disease   . Osteoporosis   . Arthritis   . Glaucoma   . Sleep apnea   . Diverticulosis 2003  . Hx of adenomatous colonic polyps 07/02/02  . Chronic back pain   . Fibromyalgia   . Esophageal dysmotility   . Hiatal hernia 11/08/09  . Hemorrhoids   . Hypertension   . Hearing loss   . Rectal bleeding     from hemorrhoids.    . Anal or rectal pain     sometimes  . Nausea   . Thrombocytopenia   . Elevated total protein   . Hypercalcemia     Past Surgical History  Procedure Date  . Nissen fundoplication 2000    with subsequent takedown in 2009  . Gastric resection 2009    Current Outpatient Prescriptions  Medication  Sig Dispense Refill  . calcium carbonate (TUMS - DOSED IN MG ELEMENTAL CALCIUM) 500 MG chewable tablet Chew 1 tablet by mouth as needed.      . ergocalciferol (VITAMIN D2) 50000 UNITS capsule Take 1 tablet on Sundays and Wednesdays (twice per week)       . naproxen sodium (ANAPROX) 220 MG tablet Take 220 mg by mouth 2 (two) times daily as needed.      . rosuvastatin (CRESTOR) 40 MG tablet Take 40 mg by mouth daily.       [name] zolpidem (AMBIEN) 10 MG tablet Take 10 mg by mouth at bedtime as needed.          ALLERGIES:  is allergic to delacort; meperidine hcl; and naproxen.  REVIEW OF SYSTEMS:  The rest of the 14-point review of system was negative.   Filed Vitals:   10/24/12 1421  BP: 150/68  Pulse: 56  Temp: 98.6 F (37 C)  Resp: 18   Wt Readings from Last 3 Encounters:  10/24/12 190 lb 12.8 oz (86.546 kg)  06/11/12 185 lb (83.915 kg)  02/18/12 186 lb 3.2 oz (84.46 kg)   ECOG Performance status: 1  PHYSICAL EXAMINATION:   General:  well-nourished woman, in no acute distress.  Eyes:  no scleral icterus.  ENT:  There were no oropharyngeal lesions.  Neck was without thyromegaly.  Lymphatics:  Negative cervical, supraclavicular or axillary adenopathy.  Respiratory: lungs were clear bilaterally without wheezing or crackles.  Cardiovascular:  Regular rate and rhythm, S1/S2, without murmur, rub or  gallop.  There was no pedal edema.  GI:  abdomen was soft, flat, nontender, nondistended, without organomegaly.  Muscoloskeletal:  no spinal tenderness of palpation of vertebral spine.  Skin exam was without echymosis, petichae.  Neuro exam was nonfocal.  Patient was able to get on and off exam table without assistance.  Gait was normal.  Patient was alerted and oriented.  Attention was good.   Language was appropriate.  Mood was normal without depression.  Speech was not pressured.  Thought content was not tangential.         LABORATORY/RADIOLOGY DATA:  Lab Results  Component Value Date   WBC  4.4 10/24/2012   HGB 13.1 10/24/2012   HCT 38.2 10/24/2012   PLT 132* 10/24/2012   GLUCOSE 87 10/24/2012   ALKPHOS 73 10/24/2012   ALT 13 10/24/2012   AST 21 10/24/2012   NA 141 10/24/2012   K 4.8 10/24/2012   CL 105 10/24/2012   CREATININE 1.0 10/24/2012   BUN 23.0 10/24/2012   CO2 30* 10/24/2012   INR 0.92 12/20/2010   HGBA1C  Value: 6.3 (NOTE)                                                                       According to the ADA Clinical Practice Recommendations for 2011, when HbA1c is used as a screening test:   >=6.5%   Diagnostic of Diabetes Mellitus           (if abnormal result  is confirmed)  5.7-6.4%   Increased risk of developing Diabetes Mellitus  References:Diagnosis and Classification of Diabetes Mellitus,Diabetes Care,2011,34(Suppl 1):S62-S69 and Standards of Medical Care in         Diabetes - 2011,Diabetes Care,2011,34  (Suppl 1):S11-S61.* 12/20/2010    ASSESSMENT AND PLAN:   1.  Issue: thrombocytopenia.  2.  Potential causes:  Most likely benign. Work up has been negative.  This has been chronic since 2009.  I have low clinical suspicion for a primary bone marrow disease since her white blood cell and hemoglobin are still normal.   3.  Recommendation:  Discharge from the Cancer Center.  Follow up with primary care physician with at least once a year CBC (complete blood count).  In the future, if platelet becomes <100 or anemia and low white blood cell, we can evaluate again.   She agreed with watchful observation and follow up with her PCP.      The length of time of the face-to-face encounter was 10 minutes. More than 50% of time was spent counseling and coordination of care.

## 2012-10-24 NOTE — Patient Instructions (Addendum)
1.  Issue:  Low platelet count. 2.  Potential causes:  Most likely benign. This has been chronic since 2009.  I have low clinical suspicion for a primary bone marrow disease since your white blood cell and hemoglobin are still normal.   3.  Recommendation:  Discharge from the Cancer Center.  Follow up with primary care physician with at least once a year CBC (complete blood count).  In the future, if platelet becomes <100 or anemia and low white blood cell, we can evaluate again.

## 2012-10-27 ENCOUNTER — Ambulatory Visit
Admission: RE | Admit: 2012-10-27 | Discharge: 2012-10-27 | Disposition: A | Discharge status: Home / Self Care | Payer: Medicare Other | Source: Ambulatory Visit | Attending: Surgery | Admitting: Surgery

## 2012-10-27 DIAGNOSIS — E041 Nontoxic single thyroid nodule: Secondary | ICD-10-CM | POA: Diagnosis not present

## 2012-11-20 DIAGNOSIS — I10 Essential (primary) hypertension: Secondary | ICD-10-CM | POA: Diagnosis not present

## 2012-11-20 DIAGNOSIS — Z9119 Patient's noncompliance with other medical treatment and regimen: Secondary | ICD-10-CM | POA: Diagnosis not present

## 2012-11-21 ENCOUNTER — Other Ambulatory Visit: Payer: Self-pay | Admitting: Internal Medicine

## 2012-11-21 ENCOUNTER — Telehealth (INDEPENDENT_AMBULATORY_CARE_PROVIDER_SITE_OTHER): Payer: Self-pay

## 2012-11-21 NOTE — Telephone Encounter (Signed)
Pt called requesting test result from u/s. Pt notified of u/s result. Pt advised I will send msg to [facility] to review with Dr Daphine Deutscher for any f/u needed.

## 2012-12-17 DIAGNOSIS — E785 Hyperlipidemia, unspecified: Secondary | ICD-10-CM | POA: Diagnosis not present

## 2012-12-17 DIAGNOSIS — I10 Essential (primary) hypertension: Secondary | ICD-10-CM | POA: Diagnosis not present

## 2013-01-07 ENCOUNTER — Other Ambulatory Visit: Payer: Self-pay | Admitting: Family Medicine

## 2013-01-07 DIAGNOSIS — Z1231 Encounter for screening mammogram for malignant neoplasm of breast: Secondary | ICD-10-CM

## 2013-01-07 DIAGNOSIS — M1990 Unspecified osteoarthritis, unspecified site: Secondary | ICD-10-CM | POA: Diagnosis not present

## 2013-01-07 DIAGNOSIS — M810 Age-related osteoporosis without current pathological fracture: Secondary | ICD-10-CM | POA: Diagnosis not present

## 2013-01-07 DIAGNOSIS — M19049 Primary osteoarthritis, unspecified hand: Secondary | ICD-10-CM | POA: Diagnosis not present

## 2013-01-07 DIAGNOSIS — IMO0001 Reserved for inherently not codable concepts without codable children: Secondary | ICD-10-CM | POA: Diagnosis not present

## 2013-01-07 DIAGNOSIS — I10 Essential (primary) hypertension: Secondary | ICD-10-CM | POA: Diagnosis not present

## 2013-01-19 DIAGNOSIS — IMO0001 Reserved for inherently not codable concepts without codable children: Secondary | ICD-10-CM | POA: Diagnosis not present

## 2013-01-19 DIAGNOSIS — M069 Rheumatoid arthritis, unspecified: Secondary | ICD-10-CM | POA: Diagnosis not present

## 2013-01-19 DIAGNOSIS — M259 Joint disorder, unspecified: Secondary | ICD-10-CM | POA: Diagnosis not present

## 2013-01-19 DIAGNOSIS — M19039 Primary osteoarthritis, unspecified wrist: Secondary | ICD-10-CM | POA: Diagnosis not present

## 2013-01-19 DIAGNOSIS — M159 Polyosteoarthritis, unspecified: Secondary | ICD-10-CM | POA: Insufficient documentation

## 2013-01-19 DIAGNOSIS — M25539 Pain in unspecified wrist: Secondary | ICD-10-CM | POA: Diagnosis not present

## 2013-01-19 DIAGNOSIS — M25519 Pain in unspecified shoulder: Secondary | ICD-10-CM | POA: Diagnosis not present

## 2013-01-19 DIAGNOSIS — M25569 Pain in unspecified knee: Secondary | ICD-10-CM | POA: Diagnosis not present

## 2013-01-19 DIAGNOSIS — M25549 Pain in joints of unspecified hand: Secondary | ICD-10-CM | POA: Diagnosis not present

## 2013-01-19 DIAGNOSIS — M2550 Pain in unspecified joint: Secondary | ICD-10-CM | POA: Diagnosis not present

## 2013-01-19 DIAGNOSIS — M1990 Unspecified osteoarthritis, unspecified site: Secondary | ICD-10-CM | POA: Diagnosis not present

## 2013-01-30 ENCOUNTER — Ambulatory Visit
Admission: RE | Admit: 2013-01-30 | Discharge: 2013-01-30 | Disposition: A | Discharge status: Home / Self Care | Payer: Medicare Other | Source: Ambulatory Visit | Attending: Family Medicine | Admitting: Family Medicine

## 2013-01-30 DIAGNOSIS — Z1231 Encounter for screening mammogram for malignant neoplasm of breast: Secondary | ICD-10-CM | POA: Diagnosis not present

## 2013-01-30 DIAGNOSIS — M810 Age-related osteoporosis without current pathological fracture: Secondary | ICD-10-CM | POA: Diagnosis not present

## 2013-01-30 DIAGNOSIS — M899 Disorder of bone, unspecified: Secondary | ICD-10-CM | POA: Diagnosis not present

## 2013-01-30 DIAGNOSIS — M949 Disorder of cartilage, unspecified: Secondary | ICD-10-CM | POA: Diagnosis not present

## 2013-02-05 DIAGNOSIS — M2550 Pain in unspecified joint: Secondary | ICD-10-CM | POA: Diagnosis not present

## 2013-02-05 DIAGNOSIS — M810 Age-related osteoporosis without current pathological fracture: Secondary | ICD-10-CM | POA: Diagnosis not present

## 2013-02-05 DIAGNOSIS — M1990 Unspecified osteoarthritis, unspecified site: Secondary | ICD-10-CM | POA: Diagnosis not present

## 2013-02-05 DIAGNOSIS — IMO0001 Reserved for inherently not codable concepts without codable children: Secondary | ICD-10-CM | POA: Diagnosis not present

## 2013-02-05 DIAGNOSIS — M25559 Pain in unspecified hip: Secondary | ICD-10-CM | POA: Diagnosis not present

## 2013-02-19 DIAGNOSIS — M79609 Pain in unspecified limb: Secondary | ICD-10-CM | POA: Diagnosis not present

## 2013-02-19 DIAGNOSIS — M1990 Unspecified osteoarthritis, unspecified site: Secondary | ICD-10-CM | POA: Diagnosis not present

## 2013-02-19 DIAGNOSIS — M810 Age-related osteoporosis without current pathological fracture: Secondary | ICD-10-CM | POA: Diagnosis not present

## 2013-02-19 DIAGNOSIS — M899 Disorder of bone, unspecified: Secondary | ICD-10-CM | POA: Diagnosis not present

## 2013-02-19 DIAGNOSIS — M545 Low back pain: Secondary | ICD-10-CM | POA: Diagnosis not present

## 2013-02-19 DIAGNOSIS — M5380 Other specified dorsopathies, site unspecified: Secondary | ICD-10-CM | POA: Diagnosis not present

## 2013-02-19 DIAGNOSIS — M949 Disorder of cartilage, unspecified: Secondary | ICD-10-CM | POA: Diagnosis not present

## 2013-04-10 DIAGNOSIS — IMO0001 Reserved for inherently not codable concepts without codable children: Secondary | ICD-10-CM | POA: Diagnosis not present

## 2013-04-10 DIAGNOSIS — I10 Essential (primary) hypertension: Secondary | ICD-10-CM | POA: Diagnosis not present

## 2013-04-10 DIAGNOSIS — F329 Major depressive disorder, single episode, unspecified: Secondary | ICD-10-CM | POA: Diagnosis not present

## 2013-04-10 DIAGNOSIS — E785 Hyperlipidemia, unspecified: Secondary | ICD-10-CM | POA: Diagnosis not present

## 2013-04-30 ENCOUNTER — Telehealth: Payer: Self-pay | Admitting: Internal Medicine

## 2013-04-30 ENCOUNTER — Ambulatory Visit (INDEPENDENT_AMBULATORY_CARE_PROVIDER_SITE_OTHER): Payer: Medicare Other | Admitting: Physician Assistant

## 2013-04-30 ENCOUNTER — Encounter: Payer: Self-pay | Admitting: Physician Assistant

## 2013-04-30 VITALS — BP 130/88 | HR 60 | Ht 69.0 in | Wt 190.0 lb

## 2013-04-30 DIAGNOSIS — K644 Residual hemorrhoidal skin tags: Secondary | ICD-10-CM

## 2013-04-30 MED ORDER — HYDROCORTISONE ACE-PRAMOXINE 2.5-1 % RE CREA: TOPICAL_CREAM | RECTAL | Status: DC

## 2013-04-30 NOTE — Progress Notes (Signed)
Subjective:    Patient ID: [name], female    DOB: 1941-07-09, 72 y.o.   MRN: [id number]  HPI  [name] is a very nice 72 year old Afro-American female known to Dr. Lina Sar who has history of external hemorrhoids and adenomatous colon polyps. She was last seen in 2012 at which time she had symptomatic thrombosed hemorrhoid which did resolve with conservative management. She had colonoscopy done in July of 2012 this was positive only for mild diverticulosis in the sigmoid colon and large external hemorrhoids. Comes in today as an acute add-on. She says she has done well over the past couple of years and hasn't had any significant problems with her hemorrhoids. She just took a long driving trip and had onset Sunday evening with rectal pain and discomfort. Prior to that she had been constipated and had not had a bowel movement for 2 days. She says her percent and her hemorrhoids have been protruding swollen and quite uncomfortable with sitting she is also seen a small amount of bright red blood. She is having normal bowel movements the past couple of days and does not feel that she's constipated.  She has been using Preparation H products without much benefit.    Review of Systems  Constitutional: Negative.   HENT: Negative.   Eyes: Negative.   Respiratory: Negative.   Cardiovascular: Negative.   Gastrointestinal: Positive for anal bleeding and rectal pain.  Endocrine: Negative.   Genitourinary: Negative.   Musculoskeletal: Negative.   Skin: Negative.   Allergic/Immunologic: Negative.   Neurological: Negative.   Hematological: Negative.   Psychiatric/Behavioral: Negative.    Outpatient Prescriptions Prior to Visit  Medication Sig Dispense Refill  . calcium carbonate (TUMS - DOSED IN MG ELEMENTAL CALCIUM) 500 MG chewable tablet Chew 1 tablet by mouth as needed.      . ergocalciferol (VITAMIN D2) 50000 UNITS capsule Take 1 tablet on Sundays and Wednesdays (twice per week)       .  rosuvastatin (CRESTOR) 40 MG tablet Take 40 mg by mouth daily.       [name] zolpidem (AMBIEN) 10 MG tablet Take 10 mg by mouth at bedtime as needed.        . naproxen sodium (ANAPROX) 220 MG tablet Take 220 mg by mouth 2 (two) times daily as needed.       No facility-administered medications prior to visit.       Allergies  Allergen Reactions  . Meperidine Hcl     Demerol causes hallucinations  . Naproxen Nausea And Vomiting   Patient Active Problem List   Diagnosis Date Noted  . Thyroid nodule 06/11/2012  . Thrombocytopenia   . Elevated total protein   . Hypercalcemia   . HYPERLIPIDEMIA 12/14/2009  . HEMORRHOIDS 12/14/2009  . DIVERTICULOSIS, COLON 12/14/2009  . HIP PAIN, CHRONIC 12/14/2009  . BACK PAIN, CHRONIC 12/14/2009  . FIBROMYALGIA 12/14/2009  . OSTEOPOROSIS 12/14/2009  . SLEEP APNEA 12/14/2009  . COLONIC POLYPS, ADENOMATOUS, HX OF 12/14/2009  . GASTRIC POLYP, HX OF 12/14/2009  . HIATAL HERNIA 10/04/2009  . DEPRESSION 09/29/2009  . REFLUX ESOPHAGITIS 09/29/2009  . GERD 09/29/2009  . CONSTIPATION 09/29/2009  . DYSPHAGIA 09/29/2009   History  Substance Use Topics  . Smoking status: Never Smoker   . Smokeless tobacco: Never Used  . Alcohol Use: No   family history includes Breast cancer in her daughter; Cancer in her daughter, maternal aunt, and maternal uncles; Diabetes in her father; and Heart disease in her mother.  There  is no history of Colon cancer.  Objective:   Physical Exam  well-developed older African American female in no acute distress, pleasant blood pressure 130/88 pulse 60 height 5 foot 9 weight 190. HEENT; nontraumatic normocephalic EOMI PERRLA sclera anicteric, Neck ;supple no JVD, Cardiovascular; regular rate and rhythm with S1-S2 no murmur or gallop, Pulmonary; clear bilaterally, Abdomen; soft nontender nondistended bowel sounds are active , Rectal; exam she is very large edematous erythematous external hemorrhoids which are quite tender to palpation  no definite thrombosis digital exam not done due to significant edema. Extremities; no clubbing cyanosis or edema skin warm and dry, Psych; mood and affect normal and appropriate.        Assessment & Plan:   #76   72 year old female with symptomatic large edematous external hemorrhoids without any definite thrombosis.  #2 diverticulosis  #3 history of adenomatous colon polyps no polyps seen on colonoscopy 2012   Plan ;Patient is advised to do sitz baths for 15-20 minutes 4-5 times daily over the next few days  Start Analpram cream 2.5% 3-4 times daily  Start recta care/lidocaine 5% cream 4-5 times daily as needed for pain Patient is asked to call back in 4-5 days if her symptoms are not significantly improved

## 2013-04-30 NOTE — Progress Notes (Signed)
Reviewed and agree. Will discuss long term management when the acute episode subsides

## 2013-04-30 NOTE — Patient Instructions (Addendum)
Go to Medical Supply store or pharmacy and get the Sitz bath  to put on the toilet and use it 4-5 times daily with hot water. We have given you samples of Recticare cream.  Apply it to your rectal area 5-6 times daily as needed for pain.You can get this at the pharmacy.  We sent a prescription to [facility] [address] for [employer] Cream.   Call us Monday the 05-04-2013 if you are not improving.

## 2013-04-30 NOTE — Telephone Encounter (Signed)
Patient having painful hemorrhoids. States "I can hardly sit." Scheduled with Mike Gip, PA today at 2:30 PM.

## 2013-05-13 DIAGNOSIS — M179 Osteoarthritis of knee, unspecified: Secondary | ICD-10-CM | POA: Insufficient documentation

## 2013-05-13 DIAGNOSIS — M1990 Unspecified osteoarthritis, unspecified site: Secondary | ICD-10-CM | POA: Diagnosis not present

## 2013-05-13 DIAGNOSIS — L0390 Cellulitis, unspecified: Secondary | ICD-10-CM | POA: Insufficient documentation

## 2013-05-13 DIAGNOSIS — IMO0001 Reserved for inherently not codable concepts without codable children: Secondary | ICD-10-CM | POA: Diagnosis not present

## 2013-05-13 DIAGNOSIS — M25569 Pain in unspecified knee: Secondary | ICD-10-CM | POA: Insufficient documentation

## 2013-05-13 DIAGNOSIS — M1710 Unilateral primary osteoarthritis, unspecified knee: Secondary | ICD-10-CM | POA: Insufficient documentation

## 2013-05-13 DIAGNOSIS — L0291 Cutaneous abscess, unspecified: Secondary | ICD-10-CM | POA: Diagnosis not present

## 2013-05-20 ENCOUNTER — Other Ambulatory Visit: Payer: Self-pay

## 2013-06-11 DIAGNOSIS — R58 Hemorrhage, not elsewhere classified: Secondary | ICD-10-CM | POA: Diagnosis not present

## 2013-06-11 DIAGNOSIS — M79609 Pain in unspecified limb: Secondary | ICD-10-CM | POA: Diagnosis not present

## 2013-06-17 DIAGNOSIS — M79609 Pain in unspecified limb: Secondary | ICD-10-CM | POA: Diagnosis not present

## 2013-06-26 DIAGNOSIS — R7989 Other specified abnormal findings of blood chemistry: Secondary | ICD-10-CM | POA: Diagnosis not present

## 2013-06-30 DIAGNOSIS — R58 Hemorrhage, not elsewhere classified: Secondary | ICD-10-CM | POA: Diagnosis not present

## 2013-06-30 DIAGNOSIS — M79609 Pain in unspecified limb: Secondary | ICD-10-CM | POA: Diagnosis not present

## 2013-07-23 DIAGNOSIS — M79609 Pain in unspecified limb: Secondary | ICD-10-CM | POA: Diagnosis not present

## 2013-07-23 DIAGNOSIS — I8310 Varicose veins of unspecified lower extremity with inflammation: Secondary | ICD-10-CM | POA: Diagnosis not present

## 2013-07-29 DIAGNOSIS — M1990 Unspecified osteoarthritis, unspecified site: Secondary | ICD-10-CM | POA: Diagnosis not present

## 2013-07-29 DIAGNOSIS — I10 Essential (primary) hypertension: Secondary | ICD-10-CM | POA: Diagnosis not present

## 2013-07-29 DIAGNOSIS — I872 Venous insufficiency (chronic) (peripheral): Secondary | ICD-10-CM | POA: Diagnosis not present

## 2013-07-29 DIAGNOSIS — D355 Benign neoplasm of carotid body: Secondary | ICD-10-CM | POA: Diagnosis not present

## 2013-07-30 DIAGNOSIS — I7090 Unspecified atherosclerosis: Secondary | ICD-10-CM | POA: Diagnosis not present

## 2013-08-04 DIAGNOSIS — D355 Benign neoplasm of carotid body: Secondary | ICD-10-CM | POA: Diagnosis not present

## 2013-08-04 DIAGNOSIS — I10 Essential (primary) hypertension: Secondary | ICD-10-CM | POA: Diagnosis not present

## 2013-08-04 DIAGNOSIS — I872 Venous insufficiency (chronic) (peripheral): Secondary | ICD-10-CM | POA: Diagnosis not present

## 2013-08-06 DIAGNOSIS — I8310 Varicose veins of unspecified lower extremity with inflammation: Secondary | ICD-10-CM | POA: Diagnosis not present

## 2013-08-20 ENCOUNTER — Other Ambulatory Visit: Payer: Self-pay

## 2013-08-20 DIAGNOSIS — M79609 Pain in unspecified limb: Secondary | ICD-10-CM | POA: Diagnosis not present

## 2013-08-20 DIAGNOSIS — I8310 Varicose veins of unspecified lower extremity with inflammation: Secondary | ICD-10-CM | POA: Diagnosis not present

## 2013-08-20 DIAGNOSIS — M7981 Nontraumatic hematoma of soft tissue: Secondary | ICD-10-CM | POA: Diagnosis not present

## 2013-09-16 DIAGNOSIS — I10 Essential (primary) hypertension: Secondary | ICD-10-CM | POA: Diagnosis not present

## 2013-09-16 DIAGNOSIS — E785 Hyperlipidemia, unspecified: Secondary | ICD-10-CM | POA: Diagnosis not present

## 2013-09-16 DIAGNOSIS — D355 Benign neoplasm of carotid body: Secondary | ICD-10-CM | POA: Diagnosis not present

## 2013-09-16 DIAGNOSIS — M19049 Primary osteoarthritis, unspecified hand: Secondary | ICD-10-CM | POA: Diagnosis not present

## 2013-09-17 ENCOUNTER — Encounter: Payer: Self-pay | Admitting: Cardiovascular Disease

## 2013-09-18 ENCOUNTER — Ambulatory Visit (INDEPENDENT_AMBULATORY_CARE_PROVIDER_SITE_OTHER): Payer: Medicare Other | Admitting: Cardiovascular Disease

## 2013-09-18 ENCOUNTER — Encounter: Payer: Self-pay | Admitting: Cardiovascular Disease

## 2013-09-18 VITALS — BP 150/90 | HR 53 | Ht 69.5 in | Wt 183.0 lb

## 2013-09-18 DIAGNOSIS — R001 Bradycardia, unspecified: Secondary | ICD-10-CM

## 2013-09-18 DIAGNOSIS — I498 Other specified cardiac arrhythmias: Secondary | ICD-10-CM

## 2013-09-18 DIAGNOSIS — E785 Hyperlipidemia, unspecified: Secondary | ICD-10-CM

## 2013-09-18 NOTE — Assessment & Plan Note (Signed)
On Crestor followed by her PCP. She does have fibromyalgia with diffuse aches and pains which may or may not be related to her statin drug. Her primary care physician is looking into this.

## 2013-09-18 NOTE — Patient Instructions (Signed)
Your physician wants you to follow-up in: 1 year with Dr Berry. You will receive a reminder letter in the mail two months in advance. If you don't receive a letter, please call our office to schedule the follow-up appointment.  

## 2013-09-18 NOTE — Progress Notes (Signed)
09/18/2013 [name]   06-18-1941  [id number]  Primary Physician Cheryl Ora, MD Primary Cardiologist: Cheryl Gess MD [name]   HPI:  The patient is a 72 year old, mildly overweight, married [place] American female, mother of 54, grandmother to 66 grandchildren, whom I last saw on January 04, 2011. She has a history of hyperlipidemia. She was admitted to [facility] on December 20, 2010, with presyncope. Her symptoms were heralded by an episode of nausea. Her other history is remarkable for degenerative joint disease, depression, and fibromyalgia as well as esophageal dysmotility, status post Nissen fundoplication in the past. A 2D echocardiogram was normal. She was complaining of some chest pain at that time, and a Myoview stress test performed in our office on January 22, 2011, was normal as well. She has had no recurrent symptoms. An event monitor was performed that showed sinus rhythm, sinus bradycardia with heart rates in the low 40 range, especially while she was asleep, but she has been asymptomatic since I last saw her.    Current Outpatient Prescriptions  Medication Sig Dispense Refill  . calcium carbonate (TUMS - DOSED IN MG ELEMENTAL CALCIUM) 500 MG chewable tablet Chew 1 tablet by mouth as needed.      . ergocalciferol (VITAMIN D2) 50000 UNITS capsule Take 1 tablet on Sundays and Wednesdays (twice per week)       . rosuvastatin (CRESTOR) 40 MG tablet Take 40 mg by mouth daily.       [name] zolpidem (AMBIEN) 10 MG tablet Take 10 mg by mouth at bedtime as needed.         No current facility-administered medications for this visit.    Allergies  Allergen Reactions  . Adalat [Nifedipine]   . Hydrocodone Itching  . Meperidine Hcl     Demerol causes hallucinations  . Naproxen Nausea And Vomiting    History   Social History  . Marital Status: Married    Spouse Name: N/A    Number of Children: N/A  . Years of Education: N/A   Occupational History  .      retired  Clinical biochemist.    Social History Main Topics  . Smoking status: Never Smoker   . Smokeless tobacco: Never Used  . Alcohol Use: No  . Drug Use: No  . Sexual Activity: Not on file   Other Topics Concern  . Not on file   Social History Narrative   Husband, [name] is Next of Kin. Cell # [id number]     Review of Systems: General: negative for chills, fever, night sweats or weight changes.  Cardiovascular: negative for chest pain, dyspnea on exertion, edema, orthopnea, palpitations, paroxysmal nocturnal dyspnea or shortness of breath Dermatological: negative for rash Respiratory: negative for cough or wheezing Urologic: negative for hematuria Abdominal: negative for nausea, vomiting, diarrhea, bright red blood per rectum, melena, or hematemesis Neurologic: negative for visual changes, syncope, or dizziness All other systems reviewed and are otherwise negative except as noted above.    Blood pressure 150/90, pulse 53, height 5' 9.5" (1.765 m), weight 183 lb (83.008 kg).  General appearance: alert and no distress Neck: no adenopathy, no carotid bruit, no JVD, supple, symmetrical, trachea midline and thyroid not enlarged, symmetric, no tenderness/mass/nodules Lungs: clear to auscultation bilaterally Heart: regular rate and rhythm, S1, S2 normal, no murmur, click, rub or gallop Extremities: extremities normal, atraumatic, no cyanosis or edema  EKG sinus bradycardia at 53 without ST or T wave changes  ASSESSMENT  AND PLAN:   HYPERLIPIDEMIA On Crestor followed by her PCP. She does have fibromyalgia with diffuse aches and pains which may or may not be related to her statin drug. Her primary care physician is looking into this.      Cheryl Gess MD FACP,FACC,FAHA, [facility] 09/18/2013 9:30 AM

## 2013-10-13 DIAGNOSIS — H2510 Age-related nuclear cataract, unspecified eye: Secondary | ICD-10-CM | POA: Diagnosis not present

## 2013-10-13 DIAGNOSIS — H409 Unspecified glaucoma: Secondary | ICD-10-CM | POA: Diagnosis not present

## 2013-10-14 DIAGNOSIS — IMO0001 Reserved for inherently not codable concepts without codable children: Secondary | ICD-10-CM | POA: Diagnosis not present

## 2013-10-14 DIAGNOSIS — I10 Essential (primary) hypertension: Secondary | ICD-10-CM | POA: Diagnosis not present

## 2013-10-14 DIAGNOSIS — E785 Hyperlipidemia, unspecified: Secondary | ICD-10-CM | POA: Diagnosis not present

## 2013-10-14 DIAGNOSIS — M19019 Primary osteoarthritis, unspecified shoulder: Secondary | ICD-10-CM | POA: Diagnosis not present

## 2013-10-14 DIAGNOSIS — H4050X0 Glaucoma secondary to other eye disorders, unspecified eye, stage unspecified: Secondary | ICD-10-CM | POA: Diagnosis not present

## 2013-10-27 DIAGNOSIS — H4011X0 Primary open-angle glaucoma, stage unspecified: Secondary | ICD-10-CM | POA: Diagnosis not present

## 2013-10-30 ENCOUNTER — Encounter (HOSPITAL_COMMUNITY): Payer: Self-pay | Admitting: Emergency Medicine

## 2013-10-30 ENCOUNTER — Emergency Department (INDEPENDENT_AMBULATORY_CARE_PROVIDER_SITE_OTHER)
Admission: EM | Admit: 2013-10-30 | Discharge: 2013-10-30 | Disposition: A | Discharge status: Home / Self Care | Payer: Medicare Other | Source: Home / Self Care | Attending: Family Medicine | Admitting: Family Medicine

## 2013-10-30 DIAGNOSIS — J069 Acute upper respiratory infection, unspecified: Secondary | ICD-10-CM

## 2013-10-30 LAB — POCT RAPID STREP A: STREPTOCOCCUS, GROUP A SCREEN (DIRECT): NEGATIVE

## 2013-10-30 MED ORDER — GI COCKTAIL ~~LOC~~
ORAL | Status: AC
  Filled 2013-10-30: qty 30

## 2013-10-30 MED ORDER — GI COCKTAIL ~~LOC~~
30.0000 mL | Freq: Once | ORAL | Status: AC
  Administered 2013-10-30: 30 mL via ORAL

## 2013-10-30 NOTE — ED Provider Notes (Signed)
CSN: [id number]     Arrival date & time 10/30/13  1831 History   First MD Initiated Contact with Patient 10/30/13 1948     Chief Complaint  Patient presents with  . Sore Throat   (Consider location/radiation/quality/duration/timing/severity/associated sxs/prior Treatment) Patient is a 73 y.o. female presenting with pharyngitis. The history is provided by the patient and the spouse.  Sore Throat This is a new problem. The current episode started more than 1 week ago (2 weeks of sx.). The problem has not changed since onset.Pertinent negatives include no chest pain and no abdominal pain. The symptoms are aggravated by swallowing.    Past Medical History  Diagnosis Date  . GERD (gastroesophageal reflux disease)     subsequent Nissen Fundoplication  . Hyperlipidemia   . Depression   . Degenerative joint disease   . Osteoporosis   . Arthritis   . Glaucoma   . Sleep apnea   . Diverticulosis 2003  . Hx of adenomatous colonic polyps 07/02/02  . Chronic back pain   . Fibromyalgia   . Esophageal dysmotility   . Hiatal hernia 11/08/09  . Hemorrhoids   . Hypertension   . Hearing loss   . Rectal bleeding     from hemorrhoids.    . Anal or rectal pain     sometimes  . Nausea   . Thrombocytopenia   . Elevated total protein   . Hypercalcemia   . Hyperlipidemia    Past Surgical History  Procedure Laterality Date  . Nissen fundoplication  1950    with subsequent takedown in 2009  . Gastric resection  2009  . Carotid doppler  03/31/2007    Bilateral ICAs - no evidence of significant diameter reduction, dissectin, tortuosity, FMD, or any other vascular abnormality.  . Cardiac catheterization  03/14/1992    Normal cardiac cath. Normal LV function.  . Cardiovascular stress test  01/22/2011    No scintigraphic evidence of inducible ischemia.  . Transthoracic echocardiogram  12/21/2010    EF 60%, moderate LVH,    Family History  Problem Relation Age of Onset  . Heart disease Mother   .  Hypertension Mother   . Diabetes Mother   . Diabetes Father   . Kidney disease Father   . Breast cancer Daughter   . Colon cancer Neg Hx   . Cancer Maternal Aunt     stomach  . Cancer Maternal Uncle     leukemia  . Cancer Maternal Uncle     prostate  . Stroke Brother   . Cancer Maternal Grandmother   . Kidney disease Maternal Grandfather    History  Substance Use Topics  . Smoking status: Never Smoker   . Smokeless tobacco: Never Used  . Alcohol Use: No   OB History   Grav Para Term Preterm Abortions TAB SAB Ect Mult Living                 Review of Systems  Constitutional: Negative.   HENT: Positive for congestion, rhinorrhea and sore throat.   Respiratory: Positive for cough.   Cardiovascular: Negative for chest pain.  Gastrointestinal: Negative.  Negative for abdominal pain.  Hematological: Positive for adenopathy.    Allergies  Adalat; Hydrocodone; Meperidine hcl; and Naproxen  Home Medications   Current Outpatient Rx  Name  Route  Sig  Dispense  Refill  . calcium carbonate (TUMS - DOSED IN MG ELEMENTAL CALCIUM) 500 MG chewable tablet   Oral   Chew 1 tablet by  mouth as needed.         . ergocalciferol (VITAMIN D2) 50000 UNITS capsule      Take 1 tablet on Sundays and Wednesdays (twice per week)          . rosuvastatin (CRESTOR) 40 MG tablet   Oral   Take 40 mg by mouth daily.          [name] zolpidem (AMBIEN) 10 MG tablet   Oral   Take 10 mg by mouth at bedtime as needed.            BP 174/71  Pulse 65  Temp(Src) 99 F (37.2 C) (Oral)  Resp 16  SpO2 100% Physical Exam  Nursing note and vitals reviewed. Constitutional: She is oriented to person, place, and time. She appears well-developed and well-nourished.  HENT:  Head: Normocephalic.  Right Ear: External ear normal.  Left Ear: External ear normal.  Nose: Nose normal.  Mouth/Throat: Oropharynx is clear and moist.  Eyes: Pupils are equal, round, and reactive to light.  Neck: Normal  range of motion. Neck supple.  Cardiovascular: Regular rhythm and normal heart sounds.   Lymphadenopathy:    She has no cervical adenopathy.  Neurological: She is alert and oriented to person, place, and time.  Skin: Skin is warm and dry.    ED Course  Procedures (including critical care time) Labs Review Labs Reviewed  POCT RAPID STREP A ([facility]RG CARE ONLY)   Imaging Review No results found.  EKG Interpretation    Date/Time:    Ventricular Rate:    PR Interval:    QRS Duration:   QT Interval:    QTC Calculation:   R Axis:     Text Interpretation:              MDM   1. URI (upper respiratory infection)       Billy Fischer, MD 10/30/13 2036

## 2013-10-30 NOTE — Discharge Instructions (Signed)
Use hurricaine spray, chloroseptic or salt water gargle and lots of fluids for sore throat. See your doctor if further problems.

## 2013-10-30 NOTE — ED Notes (Signed)
Pt  Has  Symptoms  Of  sorethroat       cough  /  Congested         And   Swelling  l  Side  Of  Her  Neck  -  The  Symptoms  Have  Not  Been releived  By otc  meds          She is  Masked  And  Sitting up  On   Table        Speaking in  Complete  sentances

## 2013-11-02 ENCOUNTER — Ambulatory Visit
Admission: RE | Admit: 2013-11-02 | Discharge: 2013-11-02 | Disposition: A | Discharge status: Home / Self Care | Payer: Medicare Other | Source: Ambulatory Visit | Attending: Family Medicine | Admitting: Family Medicine

## 2013-11-02 ENCOUNTER — Other Ambulatory Visit: Payer: Self-pay | Admitting: Family Medicine

## 2013-11-02 DIAGNOSIS — R9389 Abnormal findings on diagnostic imaging of other specified body structures: Secondary | ICD-10-CM

## 2013-11-02 DIAGNOSIS — M25512 Pain in left shoulder: Secondary | ICD-10-CM

## 2013-11-02 DIAGNOSIS — M19019 Primary osteoarthritis, unspecified shoulder: Secondary | ICD-10-CM | POA: Diagnosis not present

## 2013-11-02 DIAGNOSIS — R05 Cough: Secondary | ICD-10-CM | POA: Diagnosis not present

## 2013-11-02 DIAGNOSIS — H6120 Impacted cerumen, unspecified ear: Secondary | ICD-10-CM | POA: Diagnosis not present

## 2013-11-02 DIAGNOSIS — J069 Acute upper respiratory infection, unspecified: Secondary | ICD-10-CM | POA: Diagnosis not present

## 2013-11-02 DIAGNOSIS — R059 Cough, unspecified: Secondary | ICD-10-CM | POA: Diagnosis not present

## 2013-11-02 LAB — CULTURE, GROUP A STREP

## 2013-11-05 DIAGNOSIS — M19019 Primary osteoarthritis, unspecified shoulder: Secondary | ICD-10-CM | POA: Diagnosis not present

## 2013-11-05 DIAGNOSIS — J069 Acute upper respiratory infection, unspecified: Secondary | ICD-10-CM | POA: Diagnosis not present

## 2013-11-05 DIAGNOSIS — F5102 Adjustment insomnia: Secondary | ICD-10-CM | POA: Diagnosis not present

## 2013-11-05 DIAGNOSIS — I10 Essential (primary) hypertension: Secondary | ICD-10-CM | POA: Diagnosis not present

## 2014-01-12 DIAGNOSIS — R7989 Other specified abnormal findings of blood chemistry: Secondary | ICD-10-CM | POA: Diagnosis not present

## 2014-01-12 DIAGNOSIS — I10 Essential (primary) hypertension: Secondary | ICD-10-CM | POA: Diagnosis not present

## 2014-01-12 DIAGNOSIS — R42 Dizziness and giddiness: Secondary | ICD-10-CM | POA: Diagnosis not present

## 2014-02-04 DIAGNOSIS — E559 Vitamin D deficiency, unspecified: Secondary | ICD-10-CM | POA: Diagnosis not present

## 2014-02-04 DIAGNOSIS — R5381 Other malaise: Secondary | ICD-10-CM | POA: Diagnosis not present

## 2014-02-04 DIAGNOSIS — I10 Essential (primary) hypertension: Secondary | ICD-10-CM | POA: Diagnosis not present

## 2014-02-04 DIAGNOSIS — IMO0001 Reserved for inherently not codable concepts without codable children: Secondary | ICD-10-CM | POA: Diagnosis not present

## 2014-02-04 DIAGNOSIS — J3089 Other allergic rhinitis: Secondary | ICD-10-CM | POA: Diagnosis not present

## 2014-02-04 DIAGNOSIS — R7989 Other specified abnormal findings of blood chemistry: Secondary | ICD-10-CM | POA: Diagnosis not present

## 2014-02-04 DIAGNOSIS — K117 Disturbances of salivary secretion: Secondary | ICD-10-CM | POA: Diagnosis not present

## 2014-02-04 DIAGNOSIS — E785 Hyperlipidemia, unspecified: Secondary | ICD-10-CM | POA: Diagnosis not present

## 2014-02-04 DIAGNOSIS — R5383 Other fatigue: Secondary | ICD-10-CM | POA: Diagnosis not present

## 2014-02-10 DIAGNOSIS — E782 Mixed hyperlipidemia: Secondary | ICD-10-CM | POA: Diagnosis not present

## 2014-02-10 DIAGNOSIS — R6889 Other general symptoms and signs: Secondary | ICD-10-CM | POA: Diagnosis not present

## 2014-02-10 DIAGNOSIS — E559 Vitamin D deficiency, unspecified: Secondary | ICD-10-CM | POA: Diagnosis not present

## 2014-02-10 DIAGNOSIS — I10 Essential (primary) hypertension: Secondary | ICD-10-CM | POA: Diagnosis not present

## 2014-02-24 DIAGNOSIS — H4011X0 Primary open-angle glaucoma, stage unspecified: Secondary | ICD-10-CM | POA: Diagnosis not present

## 2014-03-24 DIAGNOSIS — E559 Vitamin D deficiency, unspecified: Secondary | ICD-10-CM | POA: Diagnosis not present

## 2014-03-24 DIAGNOSIS — K219 Gastro-esophageal reflux disease without esophagitis: Secondary | ICD-10-CM | POA: Diagnosis not present

## 2014-03-24 DIAGNOSIS — E782 Mixed hyperlipidemia: Secondary | ICD-10-CM | POA: Diagnosis not present

## 2014-03-24 DIAGNOSIS — K117 Disturbances of salivary secretion: Secondary | ICD-10-CM | POA: Diagnosis not present

## 2014-03-24 DIAGNOSIS — I10 Essential (primary) hypertension: Secondary | ICD-10-CM | POA: Diagnosis not present

## 2014-03-29 DIAGNOSIS — H409 Unspecified glaucoma: Secondary | ICD-10-CM | POA: Diagnosis not present

## 2014-03-29 DIAGNOSIS — H4011X0 Primary open-angle glaucoma, stage unspecified: Secondary | ICD-10-CM | POA: Diagnosis not present

## 2014-05-18 DIAGNOSIS — R609 Edema, unspecified: Secondary | ICD-10-CM | POA: Diagnosis not present

## 2014-05-18 DIAGNOSIS — K117 Disturbances of salivary secretion: Secondary | ICD-10-CM | POA: Diagnosis not present

## 2014-05-18 DIAGNOSIS — K219 Gastro-esophageal reflux disease without esophagitis: Secondary | ICD-10-CM | POA: Diagnosis not present

## 2014-05-18 DIAGNOSIS — I10 Essential (primary) hypertension: Secondary | ICD-10-CM | POA: Diagnosis not present

## 2014-05-18 DIAGNOSIS — E875 Hyperkalemia: Secondary | ICD-10-CM | POA: Diagnosis not present

## 2014-06-01 ENCOUNTER — Encounter: Payer: Self-pay | Admitting: Internal Medicine

## 2014-06-16 DIAGNOSIS — E782 Mixed hyperlipidemia: Secondary | ICD-10-CM | POA: Diagnosis not present

## 2014-06-16 DIAGNOSIS — I10 Essential (primary) hypertension: Secondary | ICD-10-CM | POA: Diagnosis not present

## 2014-06-16 DIAGNOSIS — K117 Disturbances of salivary secretion: Secondary | ICD-10-CM | POA: Diagnosis not present

## 2014-06-16 DIAGNOSIS — R42 Dizziness and giddiness: Secondary | ICD-10-CM | POA: Diagnosis not present

## 2014-06-28 DIAGNOSIS — H4011X0 Primary open-angle glaucoma, stage unspecified: Secondary | ICD-10-CM | POA: Diagnosis not present

## 2014-07-05 DIAGNOSIS — R059 Cough, unspecified: Secondary | ICD-10-CM | POA: Diagnosis not present

## 2014-07-05 DIAGNOSIS — R05 Cough: Secondary | ICD-10-CM | POA: Diagnosis not present

## 2014-07-05 DIAGNOSIS — K219 Gastro-esophageal reflux disease without esophagitis: Secondary | ICD-10-CM | POA: Diagnosis not present

## 2014-07-05 DIAGNOSIS — E782 Mixed hyperlipidemia: Secondary | ICD-10-CM | POA: Diagnosis not present

## 2014-07-05 DIAGNOSIS — I10 Essential (primary) hypertension: Secondary | ICD-10-CM | POA: Diagnosis not present

## 2014-07-06 ENCOUNTER — Other Ambulatory Visit: Payer: Self-pay | Admitting: Family Medicine

## 2014-07-12 ENCOUNTER — Other Ambulatory Visit: Payer: Self-pay | Admitting: Family Medicine

## 2014-07-12 ENCOUNTER — Ambulatory Visit
Admission: RE | Admit: 2014-07-12 | Discharge: 2014-07-12 | Disposition: A | Discharge status: Home / Self Care | Payer: Medicare Other | Source: Ambulatory Visit | Attending: Family Medicine | Admitting: Family Medicine

## 2014-07-12 DIAGNOSIS — R42 Dizziness and giddiness: Secondary | ICD-10-CM | POA: Diagnosis not present

## 2014-07-12 DIAGNOSIS — R0602 Shortness of breath: Secondary | ICD-10-CM

## 2014-07-12 DIAGNOSIS — R05 Cough: Secondary | ICD-10-CM

## 2014-07-12 DIAGNOSIS — I10 Essential (primary) hypertension: Secondary | ICD-10-CM | POA: Diagnosis not present

## 2014-07-12 DIAGNOSIS — I498 Other specified cardiac arrhythmias: Secondary | ICD-10-CM | POA: Diagnosis not present

## 2014-07-12 DIAGNOSIS — R059 Cough, unspecified: Secondary | ICD-10-CM

## 2014-07-16 DIAGNOSIS — R42 Dizziness and giddiness: Secondary | ICD-10-CM | POA: Diagnosis not present

## 2014-07-16 DIAGNOSIS — I951 Orthostatic hypotension: Secondary | ICD-10-CM | POA: Diagnosis not present

## 2014-07-16 DIAGNOSIS — I10 Essential (primary) hypertension: Secondary | ICD-10-CM | POA: Diagnosis not present

## 2014-07-28 DIAGNOSIS — I10 Essential (primary) hypertension: Secondary | ICD-10-CM | POA: Diagnosis not present

## 2014-07-28 DIAGNOSIS — K644 Residual hemorrhoidal skin tags: Secondary | ICD-10-CM | POA: Diagnosis not present

## 2014-07-28 DIAGNOSIS — R001 Bradycardia, unspecified: Secondary | ICD-10-CM | POA: Diagnosis not present

## 2014-07-28 DIAGNOSIS — I951 Orthostatic hypotension: Secondary | ICD-10-CM | POA: Diagnosis not present

## 2014-08-18 DIAGNOSIS — I10 Essential (primary) hypertension: Secondary | ICD-10-CM | POA: Diagnosis not present

## 2014-08-18 DIAGNOSIS — R42 Dizziness and giddiness: Secondary | ICD-10-CM | POA: Diagnosis not present

## 2014-08-18 DIAGNOSIS — R001 Bradycardia, unspecified: Secondary | ICD-10-CM | POA: Diagnosis not present

## 2014-08-18 DIAGNOSIS — E782 Mixed hyperlipidemia: Secondary | ICD-10-CM | POA: Diagnosis not present

## 2014-09-29 DIAGNOSIS — K219 Gastro-esophageal reflux disease without esophagitis: Secondary | ICD-10-CM | POA: Diagnosis not present

## 2014-09-29 DIAGNOSIS — E782 Mixed hyperlipidemia: Secondary | ICD-10-CM | POA: Diagnosis not present

## 2014-09-29 DIAGNOSIS — I10 Essential (primary) hypertension: Secondary | ICD-10-CM | POA: Diagnosis not present

## 2014-09-29 DIAGNOSIS — R05 Cough: Secondary | ICD-10-CM | POA: Diagnosis not present

## 2014-10-11 DIAGNOSIS — H4011X3 Primary open-angle glaucoma, severe stage: Secondary | ICD-10-CM | POA: Diagnosis not present

## 2014-11-10 DIAGNOSIS — K219 Gastro-esophageal reflux disease without esophagitis: Secondary | ICD-10-CM | POA: Diagnosis not present

## 2014-11-10 DIAGNOSIS — E782 Mixed hyperlipidemia: Secondary | ICD-10-CM | POA: Diagnosis not present

## 2014-11-10 DIAGNOSIS — I10 Essential (primary) hypertension: Secondary | ICD-10-CM | POA: Diagnosis not present

## 2014-11-10 DIAGNOSIS — R05 Cough: Secondary | ICD-10-CM | POA: Diagnosis not present

## 2014-11-24 ENCOUNTER — Ambulatory Visit (INDEPENDENT_AMBULATORY_CARE_PROVIDER_SITE_OTHER): Payer: Medicare Other | Admitting: Cardiovascular Disease

## 2014-11-24 ENCOUNTER — Encounter: Payer: Self-pay | Admitting: Cardiovascular Disease

## 2014-11-24 VITALS — BP 138/74 | HR 58 | Ht 70.0 in | Wt 195.7 lb

## 2014-11-24 DIAGNOSIS — I10 Essential (primary) hypertension: Secondary | ICD-10-CM | POA: Insufficient documentation

## 2014-11-24 NOTE — Progress Notes (Signed)
11/24/2014 [name] [name]   1941-04-10  [id number]  Primary Physician [name],ELIZABETH, MD Primary Cardiologist: Lorretta Harp MD Renae Gloss   HPI:  The patient is a 74 year old, mildly overweight, married [place] American female, mother of 57, grandmother to 43 grandchildren, whom I last saw on 09/18/13. She has a history of hyperlipidemia. She was admitted to [facility] on December 20, 2010, with presyncope. Her symptoms were heralded by an episode of nausea. Her other history is remarkable for degenerative joint disease, depression, and fibromyalgia as well as esophageal dysmotility, status post Nissen fundoplication in the past. A 2D echocardiogram was normal. She was complaining of some chest pain at that time, and a Myoview stress test performed in our office on January 22, 2011, was normal as well. She has had no recurrent symptoms. An event monitor was performed that showed sinus rhythm, sinus bradycardia with heart rates in the low 40 range, especially while she was asleep, but she has been asymptomatic since I last saw her.. Dr. Sarina Ill follows her blood work including lipid profile.   Current Outpatient Prescriptions  Medication Sig Dispense Refill  . bimatoprost (LUMIGAN) 0.03 % ophthalmic solution Place 1 drop into both eyes at bedtime.    . ergocalciferol (VITAMIN D2) 50000 UNITS capsule Take 1 tablet on Sundays and Wednesdays (twice per week)     . losartan (COZAAR) 50 MG tablet Take 50 mg by mouth daily.    [place] Bicarbonate (ZEGERID OTC PO) Take by mouth as needed.    . rosuvastatin (CRESTOR) 40 MG tablet Take 40 mg by mouth daily.      No current facility-administered medications for this visit.    Allergies  Allergen Reactions  . Adalat [Nifedipine]   . Hydrocodone Itching  . Meperidine Hcl     Demerol causes hallucinations  . Naproxen Nausea And Vomiting    History   Social History  . Marital Status: Married    Spouse Name: N/A  . Number of  Children: N/A  . Years of Education: N/A   Occupational History  .      retired Therapist, art.    Social History Main Topics  . Smoking status: Never Smoker   . Smokeless tobacco: Never Used  . Alcohol Use: No  . Drug Use: No  . Sexual Activity: Not on file   Other Topics Concern  . Not on file   Social History Narrative   Husband, Keniyah Gelinas is Next of Kin. Cell # [id number]     Review of Systems: General: negative for chills, fever, night sweats or weight changes.  Cardiovascular: negative for chest pain, dyspnea on exertion, edema, orthopnea, palpitations, paroxysmal nocturnal dyspnea or shortness of breath Dermatological: negative for rash Respiratory: negative for cough or wheezing Urologic: negative for hematuria Abdominal: negative for nausea, vomiting, diarrhea, bright red blood per rectum, melena, or hematemesis Neurologic: negative for visual changes, syncope, or dizziness All other systems reviewed and are otherwise negative except as noted above.    Blood pressure 138/74, pulse 58, height 5\' 10"  (1.778 m), weight 195 lb 11.2 oz (88.769 kg).  General appearance: alert and no distress Neck: no adenopathy, no carotid bruit, no JVD, supple, symmetrical, trachea midline and thyroid not enlarged, symmetric, no tenderness/mass/nodules Lungs: clear to auscultation bilaterally Heart: regular rate and rhythm, S1, S2 normal, no murmur, click, rub or gallop Extremities: extremities normal, atraumatic, no cyanosis or edema  EKG sinus bradycardia at 58 without ST or T-wave changes. There was  voltage criteria for LVH. A Persantine reviewed this EKG.  ASSESSMENT AND PLAN:   HYPERLIPIDEMIA History of hyperlipidemia on rosuvastatin followed by his PCP   Essential hypertension History of hypertension with blood pressure measures 138/74. She is on losartan 50 mg a day. Continue current meds at current dose       Lorretta Harp MD [facility],  [facility] 11/24/2014 9:39 AM

## 2014-11-24 NOTE — Assessment & Plan Note (Signed)
History of hypertension with blood pressure measures 138/74. She is on losartan 50 mg a day. Continue current meds at current dose

## 2014-11-24 NOTE — Patient Instructions (Signed)
Your physician wants you to follow-up in 1 year with Dr. Berry. You will receive a reminder letter in the mail 2 months in advance. If you do not receive a letter, please call our office to schedule the follow-up appointment.  

## 2014-11-24 NOTE — Assessment & Plan Note (Signed)
History of hyperlipidemia on rosuvastatin followed by his PCP 

## 2015-02-14 DIAGNOSIS — I10 Essential (primary) hypertension: Secondary | ICD-10-CM | POA: Diagnosis not present

## 2015-02-14 DIAGNOSIS — K219 Gastro-esophageal reflux disease without esophagitis: Secondary | ICD-10-CM | POA: Diagnosis not present

## 2015-02-14 DIAGNOSIS — E782 Mixed hyperlipidemia: Secondary | ICD-10-CM | POA: Diagnosis not present

## 2015-02-14 DIAGNOSIS — M7071 Other bursitis of hip, right hip: Secondary | ICD-10-CM | POA: Diagnosis not present

## 2015-02-16 DIAGNOSIS — H4011X3 Primary open-angle glaucoma, severe stage: Secondary | ICD-10-CM | POA: Diagnosis not present

## 2015-03-16 DIAGNOSIS — H4011X3 Primary open-angle glaucoma, severe stage: Secondary | ICD-10-CM | POA: Diagnosis not present

## 2015-04-11 ENCOUNTER — Other Ambulatory Visit: Payer: Self-pay

## 2015-05-23 ENCOUNTER — Emergency Department (HOSPITAL_COMMUNITY): Payer: Medicare Other

## 2015-05-23 ENCOUNTER — Other Ambulatory Visit: Payer: Self-pay | Admitting: *Deleted

## 2015-05-23 ENCOUNTER — Emergency Department (HOSPITAL_COMMUNITY)
Admission: EM | Admit: 2015-05-23 | Discharge: 2015-05-23 | Disposition: A | Discharge status: Home / Self Care | Payer: Medicare Other | Attending: Emergency Medicine | Admitting: Emergency Medicine

## 2015-05-23 ENCOUNTER — Encounter (HOSPITAL_COMMUNITY): Payer: Self-pay | Admitting: *Deleted

## 2015-05-23 DIAGNOSIS — G8929 Other chronic pain: Secondary | ICD-10-CM | POA: Diagnosis not present

## 2015-05-23 DIAGNOSIS — R531 Weakness: Secondary | ICD-10-CM | POA: Diagnosis not present

## 2015-05-23 DIAGNOSIS — Z79899 Other long term (current) drug therapy: Secondary | ICD-10-CM | POA: Insufficient documentation

## 2015-05-23 DIAGNOSIS — R079 Chest pain, unspecified: Secondary | ICD-10-CM | POA: Diagnosis not present

## 2015-05-23 DIAGNOSIS — Z8659 Personal history of other mental and behavioral disorders: Secondary | ICD-10-CM | POA: Insufficient documentation

## 2015-05-23 DIAGNOSIS — K2970 Gastritis, unspecified, without bleeding: Secondary | ICD-10-CM | POA: Diagnosis not present

## 2015-05-23 DIAGNOSIS — H409 Unspecified glaucoma: Secondary | ICD-10-CM | POA: Insufficient documentation

## 2015-05-23 DIAGNOSIS — E785 Hyperlipidemia, unspecified: Secondary | ICD-10-CM | POA: Diagnosis not present

## 2015-05-23 DIAGNOSIS — I10 Essential (primary) hypertension: Secondary | ICD-10-CM | POA: Insufficient documentation

## 2015-05-23 DIAGNOSIS — R110 Nausea: Secondary | ICD-10-CM | POA: Insufficient documentation

## 2015-05-23 DIAGNOSIS — Z9889 Other specified postprocedural states: Secondary | ICD-10-CM | POA: Insufficient documentation

## 2015-05-23 DIAGNOSIS — R0789 Other chest pain: Secondary | ICD-10-CM

## 2015-05-23 DIAGNOSIS — Z86010 Personal history of colonic polyps: Secondary | ICD-10-CM | POA: Diagnosis not present

## 2015-05-23 DIAGNOSIS — R0602 Shortness of breath: Secondary | ICD-10-CM | POA: Diagnosis not present

## 2015-05-23 DIAGNOSIS — R109 Unspecified abdominal pain: Secondary | ICD-10-CM | POA: Diagnosis present

## 2015-05-23 DIAGNOSIS — K219 Gastro-esophageal reflux disease without esophagitis: Secondary | ICD-10-CM | POA: Diagnosis not present

## 2015-05-23 DIAGNOSIS — Z8739 Personal history of other diseases of the musculoskeletal system and connective tissue: Secondary | ICD-10-CM | POA: Insufficient documentation

## 2015-05-23 DIAGNOSIS — H9190 Unspecified hearing loss, unspecified ear: Secondary | ICD-10-CM | POA: Diagnosis not present

## 2015-05-23 DIAGNOSIS — Z862 Personal history of diseases of the blood and blood-forming organs and certain disorders involving the immune mechanism: Secondary | ICD-10-CM | POA: Diagnosis not present

## 2015-05-23 DIAGNOSIS — K224 Dyskinesia of esophagus: Secondary | ICD-10-CM

## 2015-05-23 LAB — CBC WITH DIFFERENTIAL/PLATELET
Basophils Absolute: 0 10*3/uL (ref 0.0–0.1)
Basophils Relative: 1 % (ref 0–1)
Eosinophils Absolute: 0.1 10*3/uL (ref 0.0–0.7)
Eosinophils Relative: 3 % (ref 0–5)
HCT: 40.1 % (ref 36.0–46.0)
Hemoglobin: 13.3 g/dL (ref 12.0–15.0)
Lymphocytes Relative: 39 % (ref 12–46)
Lymphs Abs: 1.6 10*3/uL (ref 0.7–4.0)
MCH: 31.6 pg (ref 26.0–34.0)
MCHC: 33.2 g/dL (ref 30.0–36.0)
MCV: 95.2 fL (ref 78.0–100.0)
Monocytes Absolute: 0.4 10*3/uL (ref 0.1–1.0)
Monocytes Relative: 9 % (ref 3–12)
NEUTROS ABS: 2 10*3/uL (ref 1.7–7.7)
NEUTROS PCT: 48 % (ref 43–77)
Platelets: 138 10*3/uL — ABNORMAL LOW (ref 150–400)
RBC: 4.21 MIL/uL (ref 3.87–5.11)
RDW: 12.7 % (ref 11.5–15.5)
WBC: 4.1 10*3/uL (ref 4.0–10.5)

## 2015-05-23 LAB — COMPREHENSIVE METABOLIC PANEL
ALBUMIN: 3.7 g/dL (ref 3.5–5.0)
ALT: 14 U/L (ref 14–54)
ANION GAP: 8 (ref 5–15)
AST: 22 U/L (ref 15–41)
Alkaline Phosphatase: 60 U/L (ref 38–126)
BILIRUBIN TOTAL: 0.5 mg/dL (ref 0.3–1.2)
BUN: 13 mg/dL (ref 6–20)
CALCIUM: 9.1 mg/dL (ref 8.9–10.3)
CO2: 26 mmol/L (ref 22–32)
Chloride: 104 mmol/L (ref 101–111)
Creatinine, Ser: 0.95 mg/dL (ref 0.44–1.00)
GFR calc Af Amer: >60 mL/min (ref >60)
GFR, EST NON AFRICAN AMERICAN: 58 mL/min — AB (ref >60)
Glucose, Bld: 92 mg/dL (ref 65–99)
Potassium: 4.1 mmol/L (ref 3.5–5.1)
Sodium: 138 mmol/L (ref 135–145)
Total Protein: 6.5 g/dL (ref 6.5–8.1)

## 2015-05-23 LAB — D-DIMER, QUANTITATIVE: D-Dimer, Quant: 0.38 ug/mL-FEU (ref 0.00–0.48)

## 2015-05-23 LAB — I-STAT TROPONIN, ED
Troponin i, poc: 0.01 ng/mL (ref 0.00–0.08)
Troponin i, poc: 0.01 ng/mL (ref 0.00–0.08)

## 2015-05-23 MED ORDER — ASPIRIN 81 MG PO CHEW
324.0000 mg | CHEWABLE_TABLET | Freq: Once | ORAL | Status: AC
Start: 2015-05-23 — End: 2015-05-23
  Administered 2015-05-23: 324 mg via ORAL
  Filled 2015-05-23: qty 4

## 2015-05-23 MED ORDER — GI COCKTAIL ~~LOC~~
30.0000 mL | Freq: Once | ORAL | Status: AC
  Administered 2015-05-23: 30 mL via ORAL
  Filled 2015-05-23: qty 30

## 2015-05-23 MED ORDER — ASPIRIN 81 MG PO CHEW: 324.0000 mg | CHEWABLE_TABLET | Freq: Once | ORAL | Status: DC

## 2015-05-23 NOTE — ED Notes (Signed)
Patient transported to X-ray 

## 2015-05-23 NOTE — Discharge Instructions (Signed)
Your esophagus is likely causing your pain today.  Continue your current medications as prescribed and follow up with your Cardiologist and Gastroenterologist.  Get rechecked immediately if you develop any new or worsening symptoms.    Gastroesophageal Reflux Disease, Adult Gastroesophageal reflux disease (GERD) happens when acid from your stomach flows up into the esophagus. When acid comes in contact with the esophagus, the acid causes soreness (inflammation) in the esophagus. Over time, GERD may create small holes (ulcers) in the lining of the esophagus. CAUSES   Increased body weight. This puts pressure on the stomach, making acid rise from the stomach into the esophagus.  Smoking. This increases acid production in the stomach.  Drinking alcohol. This causes decreased pressure in the lower esophageal sphincter (valve or ring of muscle between the esophagus and stomach), allowing acid from the stomach into the esophagus.  Late evening meals and a full stomach. This increases pressure and acid production in the stomach.  A malformed lower esophageal sphincter. Sometimes, no cause is found. SYMPTOMS   Burning pain in the lower part of the mid-chest behind the breastbone and in the mid-stomach area. This may occur twice a week or more often.  Trouble swallowing.  Sore throat.  Dry cough.  Asthma-like symptoms including chest tightness, shortness of breath, or wheezing. DIAGNOSIS  Your caregiver may be able to diagnose GERD based on your symptoms. In some cases, X-rays and other tests may be done to check for complications or to check the condition of your stomach and esophagus. TREATMENT  Your caregiver may recommend over-the-counter or prescription medicines to help decrease acid production. Ask your caregiver before starting or adding any new medicines.  HOME CARE INSTRUCTIONS   Change the factors that you can control. Ask your caregiver for guidance concerning weight loss,  quitting smoking, and alcohol consumption.  Avoid foods and drinks that make your symptoms worse, such as:  Caffeine or alcoholic drinks.  Chocolate.  Peppermint or mint flavorings.  Garlic and onions.  Spicy foods.  Citrus fruits, such as oranges, lemons, or limes.  Tomato-based foods such as sauce, chili, salsa, and pizza.  Fried and fatty foods.  Avoid lying down for the 3 hours prior to your bedtime or prior to taking a nap.  Eat small, frequent meals instead of large meals.  Wear loose-fitting clothing. Do not wear anything tight around your waist that causes pressure on your stomach.  Raise the head of your bed 6 to 8 inches with wood blocks to help you sleep. Extra pillows will not help.  Only take over-the-counter or prescription medicines for pain, discomfort, or fever as directed by your caregiver.  Do not take aspirin, ibuprofen, or other nonsteroidal anti-inflammatory drugs (NSAIDs). SEEK IMMEDIATE MEDICAL CARE IF:   You have pain in your arms, neck, jaw, teeth, or back.  Your pain increases or changes in intensity or duration.  You develop nausea, vomiting, or sweating (diaphoresis).  You develop shortness of breath, or you faint.  Your vomit is green, yellow, black, or looks like coffee grounds or blood.  Your stool is red, bloody, or black. These symptoms could be signs of other problems, such as heart disease, gastric bleeding, or esophageal bleeding. MAKE SURE YOU:   Understand these instructions.  Will watch your condition.  Will get help right away if you are not doing well or get worse. Document Released: 07/11/2005 Document Revised: 12/24/2011 Document Reviewed: 04/20/2011 [facility] Patient Information 2015 [place], [place]. This information is not intended to replace  advice given to you by your health care provider. Make sure you discuss any questions you have with your health care provider.   Chest Pain (Nonspecific) It is often hard to  give a specific diagnosis for the cause of chest pain. There is always a chance that your pain could be related to something serious, such as a heart attack or a blood clot in the lungs. You need to follow up with your health care provider for further evaluation. CAUSES   Heartburn.  Pneumonia or bronchitis.  Anxiety or stress.  Inflammation around your heart (pericarditis) or lung (pleuritis or pleurisy).  A blood clot in the lung.  A collapsed lung (pneumothorax). It can develop suddenly on its own (spontaneous pneumothorax) or from trauma to the chest.  Shingles infection (herpes zoster virus). The chest wall is composed of bones, muscles, and cartilage. Any of these can be the source of the pain.  The bones can be bruised by injury.  The muscles or cartilage can be strained by coughing or overwork.  The cartilage can be affected by inflammation and become sore (costochondritis). DIAGNOSIS  Lab tests or other studies may be needed to find the cause of your pain. Your health care provider may have you take a test called an ambulatory electrocardiogram (ECG). An ECG records your heartbeat patterns over a 24-hour period. You may also have other tests, such as:  Transthoracic echocardiogram (TTE). During echocardiography, sound waves are used to evaluate how blood flows through your heart.  Transesophageal echocardiogram (TEE).  Cardiac monitoring. This allows your health care provider to monitor your heart rate and rhythm in real time.  Holter monitor. This is a portable device that records your heartbeat and can help diagnose heart arrhythmias. It allows your health care provider to track your heart activity for several days, if needed.  Stress tests by exercise or by giving medicine that makes the heart beat faster. TREATMENT   Treatment depends on what may be causing your chest pain. Treatment may include:  Acid blockers for heartburn.  Anti-inflammatory medicine.  Pain  medicine for inflammatory conditions.  Antibiotics if an infection is present.  You may be advised to change lifestyle habits. This includes stopping smoking and avoiding alcohol, caffeine, and chocolate.  You may be advised to keep your head raised (elevated) when sleeping. This reduces the chance of acid going backward from your stomach into your esophagus. Most of the time, nonspecific chest pain will improve within 2-3 days with rest and mild pain medicine.  HOME CARE INSTRUCTIONS   If antibiotics were prescribed, take them as directed. Finish them even if you start to feel better.  For the next few days, avoid physical activities that bring on chest pain. Continue physical activities as directed.  Do not use any tobacco products, including cigarettes, chewing tobacco, or electronic cigarettes.  Avoid drinking alcohol.  Only take medicine as directed by your health care provider.  Follow your health care provider's suggestions for further testing if your chest pain does not go away.  Keep any follow-up appointments you made. If you do not go to an appointment, you could develop lasting (chronic) problems with pain. If there is any problem keeping an appointment, call to reschedule. SEEK MEDICAL CARE IF:   Your chest pain does not go away, even after treatment.  You have a rash with blisters on your chest.  You have a fever. SEEK IMMEDIATE MEDICAL CARE IF:   You have increased chest pain or pain  that spreads to your arm, neck, jaw, back, or abdomen.  You have shortness of breath.  You have an increasing cough, or you cough up blood.  You have severe back or abdominal pain.  You feel nauseous or vomit.  You have severe weakness.  You faint.  You have chills. This is an emergency. Do not wait to see if the pain will go away. Get medical help at once. Call your local emergency services (911 in [place] Do not drive yourself to the hospital. MAKE SURE YOU:   Understand  these instructions.  Will watch your condition.  Will get help right away if you are not doing well or get worse. Document Released: 07/11/2005 Document Revised: 10/06/2013 Document Reviewed: 05/06/2008 [facility] Patient Information 2015 [place], [place]. This information is not intended to replace advice given to you by your health care provider. Make sure you discuss any questions you have with your health care provider.

## 2015-05-23 NOTE — ED Notes (Signed)
Per EMS at 9:30 this am began having throat pain that radiates to epigastric area, dull pain. Hx of hiatal hernia- - this does not feel like that to her.   Has not had am meds. Feels weak all over.

## 2015-05-23 NOTE — ED Provider Notes (Signed)
CSN: [id number]     Arrival date & time 05/23/15  1013 History   First MD Initiated Contact with Patient 05/23/15 1013     Chief Complaint  Patient presents with  . Abdominal Pain     Patient is a 74 y.o. female presenting with abdominal pain. The history is provided by the patient. No language interpreter was used.  Abdominal Pain  Ms. Scarpelli presents for evaluation of chest pain. She states that around 925 this morning she developed a sharp pain in her anterior throat that radiated to the base of her tongue and down to her central chest. Pain was sharp and constant and now it is dull and mild. She has associated nausea, generalized weakness, slight shortness of breath.  She denies any recent illness or similar prior symptoms. Denies fevers, cough, sweating, vomiting, leg swelling or pain. She has a history of hypertension hyperlipidemia as well as mitral valve prolapse and reflux, status post Nissen fundoplication 2.  Past Medical History  Diagnosis Date  . GERD (gastroesophageal reflux disease)     subsequent Nissen Fundoplication  . Hyperlipidemia   . Depression   . Degenerative joint disease   . Osteoporosis   . Arthritis   . Glaucoma   . Sleep apnea   . Diverticulosis 2003  . Hx of adenomatous colonic polyps 07/02/02  . Chronic back pain   . Fibromyalgia   . Esophageal dysmotility   . Hiatal hernia 11/08/09  . Hemorrhoids   . Hypertension   . Hearing loss   . Rectal bleeding     from hemorrhoids.    . Anal or rectal pain     sometimes  . Nausea   . Thrombocytopenia   . Elevated total protein   . Hypercalcemia   . Hyperlipidemia    Past Surgical History  Procedure Laterality Date  . Nissen fundoplication  0626    with subsequent takedown in 2009  . Gastric resection  2009  . Carotid doppler  03/31/2007    Bilateral ICAs - no evidence of significant diameter reduction, dissectin, tortuosity, FMD, or any other vascular abnormality.  . Cardiac catheterization   03/14/1992    Normal cardiac cath. Normal LV function.  . Cardiovascular stress test  01/22/2011    No scintigraphic evidence of inducible ischemia.  . Transthoracic echocardiogram  12/21/2010    EF 60%, moderate LVH,    Family History  Problem Relation Age of Onset  . Heart disease Mother   . Hypertension Mother   . Diabetes Mother   . Diabetes Father   . Kidney disease Father   . Breast cancer Daughter   . Colon cancer Neg Hx   . Cancer Maternal Aunt     stomach  . Cancer Maternal Uncle     leukemia  . Cancer Maternal Uncle     prostate  . Stroke Brother   . Cancer Maternal Grandmother   . Kidney disease Maternal Grandfather    History  Substance Use Topics  . Smoking status: Never Smoker   . Smokeless tobacco: Never Used  . Alcohol Use: No   OB History    No data available     Review of Systems  Gastrointestinal: Positive for abdominal pain.  All other systems reviewed and are negative.     Allergies  Adalat; Hydrocodone; Meperidine hcl; and Naproxen  Home Medications   Prior to Admission medications   Medication Sig Start Date End Date Taking? Authorizing Provider  bimatoprost (LUMIGAN) 0.03 %  ophthalmic solution Place 1 drop into both eyes at bedtime.    Historical Provider, MD  ergocalciferol (VITAMIN D2) 50000 UNITS capsule Take 1 tablet on Sundays and Wednesdays (twice per week)     Historical Provider, MD  losartan (COZAAR) 50 MG tablet Take 50 mg by mouth daily.    Historical Provider, MD  Omeprazole-Sodium Bicarbonate (ZEGERID OTC PO) Take by mouth as needed.    Historical Provider, MD  rosuvastatin (CRESTOR) 40 MG tablet Take 40 mg by mouth daily.     Historical Provider, MD   BP 144/54 mmHg  Pulse 51  Temp(Src) 98 F (36.7 C) (Oral)  Resp 18  SpO2 99% Physical Exam  Constitutional: She is oriented to person, place, and time. She appears well-developed and well-nourished.  HENT:  Head: Normocephalic and atraumatic.  Cardiovascular: Normal rate  and regular rhythm.   No murmur heard. Pulmonary/Chest: Effort normal and breath sounds normal. No respiratory distress.  Abdominal: Soft. There is no tenderness. There is no rebound and no guarding.  Musculoskeletal: She exhibits no tenderness.  Trace nonpitting edema in bilateral lower extremities  Neurological: She is alert and oriented to person, place, and time.  Skin: Skin is warm and dry.  Psychiatric: She has a normal mood and affect. Her behavior is normal.  Nursing note and vitals reviewed.   ED Course  Procedures (including critical care time) Labs Review Labs Reviewed  COMPREHENSIVE METABOLIC PANEL - Abnormal; Notable for the following:    GFR calc non Af Amer 58 (*)    All other components within normal limits  CBC WITH DIFFERENTIAL/PLATELET - Abnormal; Notable for the following:    Platelets 138 (*)    All other components within normal limits  D-DIMER, QUANTITATIVE (NOT AT [facility])  Randolm Idol, ED  Randolm Idol, ED    Imaging Review Dg Chest 2 View  05/23/2015   CLINICAL DATA:  Chest pain.  EXAM: CHEST  2 VIEW  COMPARISON:  07/12/2014  FINDINGS: There is borderline cardiomegaly. Pulmonary vascularity is within normal limits in the lungs are clear. No acute osseous abnormality. Chronic accentuation of the thoracic kyphosis. No effusions.  IMPRESSION: No acute abnormality.  Chronic borderline cardiomegaly.   Electronically Signed   By: Lorriane Shire M.D.   On: 05/23/2015 11:13     EKG Interpretation   Date/Time:  Monday May 23 2015 10:37:20 EDT Ventricular Rate:  49 PR Interval:  183 QRS Duration: 97 QT Interval:  425 QTC Calculation: 384 R Axis:   26 Text Interpretation:  Sinus bradycardia Left ventricular hypertrophy  Confirmed by Hazle Coca [phone]) on 05/23/2015 10:40:02 AM      MDM   Final diagnoses:  Chest pain, unspecified chest pain type    Patient here for evaluation of chest pain, improved on repeat evaluation in the emergency  department. Discussed with cardiology, Dr. Marlou Porch, who evaluated the patient in the ED - feels this is not cardiac in nature. Plan for repeat troponin and DC home with outpatient GI and cardiology follow-up. Discussed the patient home care return precautions. Presentation is not consistent with dissection, PE, pneumonia.    Quintella Reichert, MD 05/23/15 1525

## 2015-05-23 NOTE — Consult Note (Signed)
Admit date: 05/23/2015 Referring Physician  Dr. Ralene Bathe Primary Physician Rachell Cipro, MD Primary Cardiologist  Dr. Gwenlyn Found Reason for Consultation  Chest pain  HPI: 74 year old with history of presyncope [place] by an episode of nausea with depression, fibromyalgia, esophageal dysmotility, previous fundoplication who presented to the emergency department with chest pain. Previous 2-D echocardiogram was normal. She had a nuclear stress test performed on 01/22/2011 which was normal as well. An event monitor showed occasional sinus bradycardia with rates as low as 41 sleeping but has been asymptomatic.  Pain was described as sharp discomfort in the anterior throat region that radiated to the base of her tongue and down in her central chest that occurred earlier this morning, sharp and constant now appearing dull and mild upon presentation in the emergency room. She may have felt some generalized weakness, nausea, perhaps some slight shortness of breath. No recent fevers, vomiting, orthopnea, PND.  Her chest pain does not seem to be exertional related, she denies having any vomiting or any relation to food.  Currently there is minimal to no discomfort. She is actually quite eager to be able to go back home.  Point-of-care troponin is normal at 0.01. D-dimer is normal at 0.38. LFTs are normal.    PMH:   Past Medical History  Diagnosis Date  . GERD (gastroesophageal reflux disease)     subsequent Nissen Fundoplication  . Hyperlipidemia   . Depression   . Degenerative joint disease   . Osteoporosis   . Arthritis   . Glaucoma   . Sleep apnea   . Diverticulosis 2003  . Hx of adenomatous colonic polyps 07/02/02  . Chronic back pain   . Fibromyalgia   . Esophageal dysmotility   . Hiatal hernia 11/08/09  . Hemorrhoids   . Hypertension   . Hearing loss   . Rectal bleeding     from hemorrhoids.    . Anal or rectal pain     sometimes  . Nausea   . Thrombocytopenia   . Elevated  total protein   . Hypercalcemia   . Hyperlipidemia     PSH:   Past Surgical History  Procedure Laterality Date  . Nissen fundoplication  0300    with subsequent takedown in 2009  . Gastric resection  2009  . Carotid doppler  03/31/2007    Bilateral ICAs - no evidence of significant diameter reduction, dissectin, tortuosity, FMD, or any other vascular abnormality.  . Cardiac catheterization  03/14/1992    Normal cardiac cath. Normal LV function.  . Cardiovascular stress test  01/22/2011    No scintigraphic evidence of inducible ischemia.  . Transthoracic echocardiogram  12/21/2010    EF 60%, moderate LVH,    Allergies:  Adalat; Hydrocodone; Meperidine hcl; and Naproxen Prior to Admit Meds:   Prior to Admission medications   Medication Sig Start Date End Date Taking? Authorizing Provider  bimatoprost (LUMIGAN) 0.03 % ophthalmic solution Place 1 drop into both eyes at bedtime.    Historical Provider, MD  ergocalciferol (VITAMIN D2) 50000 UNITS capsule Take 1 tablet on Sundays and Wednesdays (twice per week)     Historical Provider, MD  losartan (COZAAR) 50 MG tablet Take 50 mg by mouth daily.    Historical Provider, MD  Omeprazole-Sodium Bicarbonate (ZEGERID OTC PO) Take by mouth as needed.    Historical Provider, MD  rosuvastatin (CRESTOR) 40 MG tablet Take 40 mg by mouth daily.     Historical Provider, MD   Carolin Guernsey:  Family History  Problem Relation Age of Onset  . Heart disease Mother   . Hypertension Mother   . Diabetes Mother   . Diabetes Father   . Kidney disease Father   . Breast cancer Daughter   . Colon cancer Neg Hx   . Cancer Maternal Aunt     stomach  . Cancer Maternal Uncle     leukemia  . Cancer Maternal Uncle     prostate  . Stroke Brother   . Cancer Maternal Grandmother   . Kidney disease Maternal Grandfather    Social HX:    History   Social History  . Marital Status: Married    Spouse Name: N/A  . Number of Children: N/A  . Years of Education: N/A    Occupational History  .      retired Therapist, art.    Social History Main Topics  . Smoking status: Never Smoker   . Smokeless tobacco: Never Used  . Alcohol Use: No  . Drug Use: No  . Sexual Activity: Not on file   Other Topics Concern  . Not on file   Social History Narrative   Husband, Aleathea Pugmire is Next of Kin. Cell # [phone]     ROS:  All 11 ROS were addressed and are negative except what is stated in the HPI   Physical Exam: Blood pressure 162/75, pulse 51, temperature 98 F (36.7 C), temperature source Oral, resp. rate 20, SpO2 98 %.   General: Well developed, well nourished, in no acute distress Head: Eyes PERRLA, No xanthomas.   Normal cephalic and atramatic  Lungs:   Clear bilaterally to auscultation and percussion. Normal respiratory effort. No wheezes, no rales. Heart:   HRRR S1 S2 Pulses are 2+ & equal. No murmur, rubs, gallops.  No carotid bruit. No JVD.  No abdominal bruits.  Abdomen: Bowel sounds are positive, abdomen soft and non-tender without masses. No hepatosplenomegaly. Msk:  Back normal. Normal strength and tone for age. Extremities:  No clubbing, cyanosis or edema.  DP +1 Neuro: Alert and oriented X 3, non-focal, MAE x 4 GU: Deferred Rectal: Deferred Psych:  Good affect, responds appropriately      Labs: Lab Results  Component Value Date   WBC 4.1 05/23/2015   HGB 13.3 05/23/2015   HCT 40.1 05/23/2015   MCV 95.2 05/23/2015   PLT 138* 05/23/2015     Recent Labs Lab 05/23/15 1045  NA 138  K 4.1  CL 104  CO2 26  BUN 13  CREATININE 0.95  CALCIUM 9.1  PROT 6.5  BILITOT 0.5  ALKPHOS 60  ALT 14  AST 22  GLUCOSE 92   No results for input(s): CKTOTAL, CKMB, TROPONINI in the last 72 hours. No results found for: CHOL, HDL, LDLCALC, TRIG Lab Results  Component Value Date   DDIMER 0.38 05/23/2015     Radiology:  Dg Chest 2 View  05/23/2015   CLINICAL DATA:  Chest pain.  EXAM: CHEST  2 VIEW  COMPARISON:  07/12/2014   FINDINGS: There is borderline cardiomegaly. Pulmonary vascularity is within normal limits in the lungs are clear. No acute osseous abnormality. Chronic accentuation of the thoracic kyphosis. No effusions.  IMPRESSION: No acute abnormality.  Chronic borderline cardiomegaly.   Electronically Signed   By: Lorriane Shire M.D.   On: 05/23/2015 11:13   Personally viewed.  EKG:  Sinus bradycardia with no ST segment changes, left ventricular hypertrophy. Personally viewed.   ASSESSMENT/PLAN:    74 year old  female with history of esophageal dysmotility, fibromyalgia, prior nuclear stress test in 2012 which was normal, low risk here with chest discomfort.  1. Chest discomfort  - EKGs are reassuring, no ST segment deviation, no signs of ischemia  - First troponin is normal. Repeating troponin  - Differential includes cardiac but also could include GI given her previous history of GERD/esophageal dysmotility or perhaps musculoskeletal  - Nonetheless, despite previous nuclear stress test in 2012 that was normal, I will set her up for for a Myoview in the outpatient setting to further evaluate for any signs of ischemia.  - She was quite interested in going home and I think this is reasonable for her second troponin is normal given her onset of pain at around 9:00 this morning.  - Reassuring d-dimer.  2. GERD  - Could be a component to her discomfort  - Continue PPI  3. Hyperlipidemia  - Continue with Crestor.  I discussed plan with ER physician, Dr. Ralene Bathe.  Candee Furbish, MD  05/23/2015  11:50 AM

## 2015-05-24 DIAGNOSIS — I10 Essential (primary) hypertension: Secondary | ICD-10-CM | POA: Diagnosis not present

## 2015-05-24 DIAGNOSIS — R079 Chest pain, unspecified: Secondary | ICD-10-CM | POA: Diagnosis not present

## 2015-05-24 DIAGNOSIS — R5383 Other fatigue: Secondary | ICD-10-CM | POA: Diagnosis not present

## 2015-05-24 DIAGNOSIS — F331 Major depressive disorder, recurrent, moderate: Secondary | ICD-10-CM | POA: Diagnosis not present

## 2015-05-27 ENCOUNTER — Telehealth (HOSPITAL_COMMUNITY): Payer: Self-pay

## 2015-05-27 NOTE — Telephone Encounter (Signed)
Encounter complete. 

## 2015-05-31 ENCOUNTER — Telehealth (HOSPITAL_COMMUNITY): Payer: Self-pay

## 2015-05-31 ENCOUNTER — Encounter (HOSPITAL_COMMUNITY): Payer: Medicare Other

## 2015-05-31 NOTE — Telephone Encounter (Signed)
Encounter complete. 

## 2015-06-01 ENCOUNTER — Ambulatory Visit (HOSPITAL_COMMUNITY)
Admission: RE | Admit: 2015-06-01 | Discharge: 2015-06-01 | Disposition: A | Discharge status: Home / Self Care | Payer: Medicare Other | Source: Ambulatory Visit | Attending: Cardiology | Admitting: Cardiology

## 2015-06-01 DIAGNOSIS — R079 Chest pain, unspecified: Secondary | ICD-10-CM

## 2015-06-01 LAB — MYOCARDIAL PERFUSION IMAGING
LVDIAVOL: 107 mL
LVSYSVOL: 47 mL
Peak HR: 72 {beats}/min
Rest HR: 43 {beats}/min
SDS: 1
SRS: 1
SSS: 2
TID: 1.03

## 2015-06-01 MED ORDER — REGADENOSON 0.4 MG/5ML IV SOLN
0.4000 mg | Freq: Once | INTRAVENOUS | Status: AC
  Administered 2015-06-01: 0.4 mg via INTRAVENOUS

## 2015-06-01 MED ORDER — AMINOPHYLLINE 25 MG/ML IV SOLN
75.0000 mg | Freq: Once | INTRAVENOUS | Status: AC
  Administered 2015-06-01: 75 mg via INTRAVENOUS

## 2015-06-01 MED ORDER — TECHNETIUM TC 99M SESTAMIBI GENERIC - CARDIOLITE
10.2000 | Freq: Once | INTRAVENOUS | Status: AC | PRN
  Administered 2015-06-01: 10 via INTRAVENOUS

## 2015-06-01 MED ORDER — TECHNETIUM TC 99M SESTAMIBI GENERIC - CARDIOLITE
31.6000 | Freq: Once | INTRAVENOUS | Status: AC | PRN
  Administered 2015-06-01: 32 via INTRAVENOUS

## 2015-06-09 ENCOUNTER — Ambulatory Visit
Admission: RE | Admit: 2015-06-09 | Discharge: 2015-06-09 | Disposition: A | Discharge status: Home / Self Care | Payer: Medicare Other | Source: Ambulatory Visit | Attending: Family Medicine | Admitting: Family Medicine

## 2015-06-09 ENCOUNTER — Encounter (HOSPITAL_COMMUNITY): Payer: Self-pay | Admitting: Neurology

## 2015-06-09 ENCOUNTER — Emergency Department (HOSPITAL_COMMUNITY)
Admission: EM | Admit: 2015-06-09 | Discharge: 2015-06-09 | Disposition: A | Discharge status: Home / Self Care | Payer: Medicare Other | Attending: Emergency Medicine | Admitting: Emergency Medicine

## 2015-06-09 ENCOUNTER — Emergency Department (HOSPITAL_COMMUNITY): Payer: Medicare Other

## 2015-06-09 ENCOUNTER — Other Ambulatory Visit: Payer: Self-pay | Admitting: Family Medicine

## 2015-06-09 DIAGNOSIS — G8929 Other chronic pain: Secondary | ICD-10-CM | POA: Diagnosis not present

## 2015-06-09 DIAGNOSIS — K449 Diaphragmatic hernia without obstruction or gangrene: Secondary | ICD-10-CM | POA: Diagnosis not present

## 2015-06-09 DIAGNOSIS — I10 Essential (primary) hypertension: Secondary | ICD-10-CM | POA: Diagnosis not present

## 2015-06-09 DIAGNOSIS — Z79899 Other long term (current) drug therapy: Secondary | ICD-10-CM | POA: Insufficient documentation

## 2015-06-09 DIAGNOSIS — Z8719 Personal history of other diseases of the digestive system: Secondary | ICD-10-CM | POA: Diagnosis not present

## 2015-06-09 DIAGNOSIS — Z8659 Personal history of other mental and behavioral disorders: Secondary | ICD-10-CM | POA: Insufficient documentation

## 2015-06-09 DIAGNOSIS — K6389 Other specified diseases of intestine: Secondary | ICD-10-CM | POA: Diagnosis not present

## 2015-06-09 DIAGNOSIS — R109 Unspecified abdominal pain: Secondary | ICD-10-CM

## 2015-06-09 DIAGNOSIS — K275 Chronic or unspecified peptic ulcer, site unspecified, with perforation: Secondary | ICD-10-CM

## 2015-06-09 DIAGNOSIS — M545 Low back pain, unspecified: Secondary | ICD-10-CM

## 2015-06-09 DIAGNOSIS — H9190 Unspecified hearing loss, unspecified ear: Secondary | ICD-10-CM | POA: Insufficient documentation

## 2015-06-09 DIAGNOSIS — Z862 Personal history of diseases of the blood and blood-forming organs and certain disorders involving the immune mechanism: Secondary | ICD-10-CM | POA: Insufficient documentation

## 2015-06-09 DIAGNOSIS — R1013 Epigastric pain: Secondary | ICD-10-CM

## 2015-06-09 DIAGNOSIS — E785 Hyperlipidemia, unspecified: Secondary | ICD-10-CM | POA: Diagnosis not present

## 2015-06-09 DIAGNOSIS — K37 Unspecified appendicitis: Secondary | ICD-10-CM

## 2015-06-09 DIAGNOSIS — R1011 Right upper quadrant pain: Secondary | ICD-10-CM

## 2015-06-09 DIAGNOSIS — K219 Gastro-esophageal reflux disease without esophagitis: Secondary | ICD-10-CM | POA: Diagnosis not present

## 2015-06-09 LAB — COMPREHENSIVE METABOLIC PANEL
ALK PHOS: 61 U/L (ref 38–126)
ALT: 15 U/L (ref 14–54)
AST: 23 U/L (ref 15–41)
Albumin: 3.6 g/dL (ref 3.5–5.0)
Anion gap: 5 (ref 5–15)
BUN: 10 mg/dL (ref 6–20)
CHLORIDE: 106 mmol/L (ref 101–111)
CO2: 28 mmol/L (ref 22–32)
CREATININE: 0.95 mg/dL (ref 0.44–1.00)
Calcium: 9.1 mg/dL (ref 8.9–10.3)
GFR, EST NON AFRICAN AMERICAN: 58 mL/min — AB (ref >60)
Glucose, Bld: 140 mg/dL — ABNORMAL HIGH (ref 65–99)
Potassium: 4.1 mmol/L (ref 3.5–5.1)
Sodium: 139 mmol/L (ref 135–145)
Total Bilirubin: 0.5 mg/dL (ref 0.3–1.2)
Total Protein: 6.6 g/dL (ref 6.5–8.1)

## 2015-06-09 LAB — URINALYSIS, ROUTINE W REFLEX MICROSCOPIC
BILIRUBIN URINE: NEGATIVE
Glucose, UA: NEGATIVE mg/dL
Hgb urine dipstick: NEGATIVE
KETONES UR: NEGATIVE mg/dL
Leukocytes, UA: NEGATIVE
NITRITE: NEGATIVE
PH: 7.5 (ref 5.0–8.0)
PROTEIN: NEGATIVE mg/dL
Specific Gravity, Urine: 1.041 — ABNORMAL HIGH (ref 1.005–1.030)
Urobilinogen, UA: 0.2 mg/dL (ref 0.0–1.0)

## 2015-06-09 LAB — CBC
HCT: 37.5 % (ref 36.0–46.0)
Hemoglobin: 12.6 g/dL (ref 12.0–15.0)
MCH: 31.9 pg (ref 26.0–34.0)
MCHC: 33.6 g/dL (ref 30.0–36.0)
MCV: 94.9 fL (ref 78.0–100.0)
PLATELETS: 149 10*3/uL — AB (ref 150–400)
RBC: 3.95 MIL/uL (ref 3.87–5.11)
RDW: 12.4 % (ref 11.5–15.5)
WBC: 4.8 10*3/uL (ref 4.0–10.5)

## 2015-06-09 LAB — LIPASE, BLOOD: LIPASE: 19 U/L — AB (ref 22–51)

## 2015-06-09 MED ORDER — ONDANSETRON HCL 4 MG PO TABS: 4.0000 mg | ORAL_TABLET | Freq: Four times a day (QID) | ORAL | Status: DC

## 2015-06-09 MED ORDER — IOPAMIDOL (ISOVUE-300) INJECTION 61%
100.0000 mL | Freq: Once | INTRAVENOUS | Status: AC | PRN
  Administered 2015-06-09: 100 mL via INTRAVENOUS

## 2015-06-09 MED ORDER — ONDANSETRON HCL 4 MG PO TABS
4.0000 mg | ORAL_TABLET | Freq: Once | ORAL | Status: AC
  Administered 2015-06-09: 4 mg via ORAL
  Filled 2015-06-09: qty 1

## 2015-06-09 MED ORDER — HYDROCODONE-ACETAMINOPHEN 5-325 MG PO TABS: 2.0000 | ORAL_TABLET | ORAL | Status: DC | PRN

## 2015-06-09 MED ORDER — OXYCODONE-ACETAMINOPHEN 5-325 MG PO TABS
1.0000 | ORAL_TABLET | Freq: Once | ORAL | Status: AC
  Administered 2015-06-09: 1 via ORAL
  Filled 2015-06-09: qty 1

## 2015-06-09 NOTE — ED Provider Notes (Signed)
CSN: [id number]     Arrival date & time 06/09/15  1633 History   First MD Initiated Contact with Patient 06/09/15 1827     Chief Complaint  Patient presents with  . Abdominal Pain    HPI   74 year old female who presents today with abdominal pain. Patient was seen by her primary care doctor for this with a CT scan today. Patient reports that the abdominal pain has been present for approximately one week she, describes it as diffuse, but also has focal pain to palpation of the right upper quadrant, with radiation into her right flank. She reports that the flank pain is constant, the right upper quadrant pain happens after eating, and the lower diffuse abdominal pain comes and goes with food. She reports that she's had multiple loose stools over the last several days, she describes them as "light colored". Patient was contacted by her primary care provider office noting that she needs to follow up in the emergency room due to results found on CT scan. She was given no further information. Patient denies fever, chills, nausea, vomiting, rash, change in color clarity or characteristics of her urine.   Past Medical History  Diagnosis Date  . GERD (gastroesophageal reflux disease)     subsequent Nissen Fundoplication  . Hyperlipidemia   . Depression   . Degenerative joint disease   . Osteoporosis   . Arthritis   . Glaucoma   . Sleep apnea   . Diverticulosis 2003  . Hx of adenomatous colonic polyps 07/02/02  . Chronic back pain   . Fibromyalgia   . Esophageal dysmotility   . Hiatal hernia 11/08/09  . Hemorrhoids   . Hypertension   . Hearing loss   . Rectal bleeding     from hemorrhoids.    . Anal or rectal pain     sometimes  . Nausea   . Thrombocytopenia   . Elevated total protein   . Hypercalcemia   . Hyperlipidemia    Past Surgical History  Procedure Laterality Date  . Nissen fundoplication  6389    with subsequent takedown in 2009  . Gastric resection  2009  . Carotid doppler   03/31/2007    Bilateral ICAs - no evidence of significant diameter reduction, dissectin, tortuosity, FMD, or any other vascular abnormality.  . Cardiac catheterization  03/14/1992    Normal cardiac cath. Normal LV function.  . Cardiovascular stress test  01/22/2011    No scintigraphic evidence of inducible ischemia.  . Transthoracic echocardiogram  12/21/2010    EF 60%, moderate LVH,    Family History  Problem Relation Age of Onset  . Heart disease Mother   . Hypertension Mother   . Diabetes Mother   . Diabetes Father   . Kidney disease Father   . Breast cancer Daughter   . Colon cancer Neg Hx   . Cancer Maternal Aunt     stomach  . Cancer Maternal Uncle     leukemia  . Cancer Maternal Uncle     prostate  . Stroke Brother   . Cancer Maternal Grandmother   . Kidney disease Maternal Grandfather    Social History  Substance Use Topics  . Smoking status: Never Smoker   . Smokeless tobacco: Never Used  . Alcohol Use: No   OB History    No data available     Review of Systems  All other systems reviewed and are negative.   Allergies  Adalat; Hydrocodone; Meperidine hcl; and Naproxen  Home Medications   Prior to Admission medications   Medication Sig Start Date End Date Taking? Authorizing Provider  bimatoprost (LUMIGAN) 0.03 % ophthalmic solution Place 1 drop into both eyes at bedtime.   Yes Historical Provider, MD  Cholecalciferol (VITAMIN D) 2000 UNITS CAPS Take 6,000 Units by mouth daily.   Yes Historical Provider, MD  losartan (COZAAR) 50 MG tablet Take 25 mg by mouth daily.   Yes Historical Provider, MD  montelukast (SINGULAIR) 10 MG tablet Take 10 mg by mouth at bedtime.   Yes Historical Provider, MD  omeprazole (PRILOSEC) 40 MG capsule Take 40 mg by mouth daily as needed (heartburn).   Yes Historical Provider, MD  rosuvastatin (CRESTOR) 20 MG tablet Take 20 mg by mouth daily.   Yes Historical Provider, MD  timolol (BETIMOL) 0.5 % ophthalmic solution Place 1 drop  into both eyes 2 (two) times daily.   Yes Historical Provider, MD  HYDROcodone-acetaminophen (NORCO/VICODIN) 5-325 MG per tablet Take 2 tablets by mouth every 4 (four) hours as needed. 06/09/15   Okey Regal, PA-C  ondansetron (ZOFRAN) 4 MG tablet Take 1 tablet (4 mg total) by mouth every 6 (six) hours. 06/09/15   Evangaline Jou, PA-C    BP 149/63 mmHg  Pulse 53  Temp(Src) 98.6 F (37 C) (Oral)  Resp 18  SpO2 98%   Physical Exam  Constitutional: She is oriented to person, place, and time. She appears well-developed and well-nourished.  HENT:  Head: Normocephalic and atraumatic.  Eyes: Conjunctivae are normal. Pupils are equal, round, and reactive to light. Right eye exhibits no discharge. Left eye exhibits no discharge. No scleral icterus.  Neck: Normal range of motion. No JVD present. No tracheal deviation present.  Cardiovascular: Normal rate.   Pulmonary/Chest: Effort normal. No stridor.  Abdominal: Soft.  RUQ pain  Musculoskeletal: Normal range of motion. She exhibits no edema or tenderness.  TTP of right lower lumbar and flank, no signs of trauma   Neurological: She is alert and oriented to person, place, and time. Coordination normal.  Skin: Skin is warm and dry. No rash noted. No erythema. No pallor.  Psychiatric: She has a normal mood and affect. Her behavior is normal. Judgment and thought content normal.  Nursing note and vitals reviewed.   ED Course  Procedures (including critical care time) Labs Review Labs Reviewed  LIPASE, BLOOD - Abnormal; Notable for the following:    Lipase 19 (*)    All other components within normal limits  COMPREHENSIVE METABOLIC PANEL - Abnormal; Notable for the following:    Glucose, Bld 140 (*)    GFR calc non Af Amer 58 (*)    All other components within normal limits  CBC - Abnormal; Notable for the following:    Platelets 149 (*)    All other components within normal limits  URINALYSIS, ROUTINE W REFLEX MICROSCOPIC (NOT AT [facility])  - Abnormal; Notable for the following:    Specific Gravity, Urine 1.041 (*)    All other components within normal limits   Imaging Review Dg Abd 1 View  06/09/2015   CLINICAL DATA:  6-7 day history of right flank pain associated with nausea; history of gastric resection in 2009  EXAM: ABDOMEN - 1 VIEW  COMPARISON:  Abdominal CT scan of June 05, 2011 and acute abdominal series of the same day  FINDINGS: There are loops of mildly distended gas-filled small bowel to the right and left of the lumbar spine. The maximal diameter of the gas-filled bowel  is 4.1 cm. There is a moderate stool and gas burden within the colon. There is no significant rectal gas. There are phleboliths within the pelvis. No kidney or ureteral stones are observed.  IMPRESSION: Findings compatible with a small bowel ileus or partial mid to distal obstruction. There is no evidence of perforation.   Electronically Signed   By: David  Martinique M.D.   On: 06/09/2015 13:49   Ct Abdomen Pelvis W Contrast  06/09/2015   CLINICAL DATA:  Epigastric and right abdominal pain for the past 6 days. Nausea. Gastric resection and Nissen fundoplication in 1601.  EXAM: CT ABDOMEN AND PELVIS WITH CONTRAST  TECHNIQUE: Multidetector CT imaging of the abdomen and pelvis was performed using the standard protocol following bolus administration of intravenous contrast.  CONTRAST:  1109mL ISOVUE-300 IOPAMIDOL (ISOVUE-300) INJECTION 61%  COMPARISON:  06/05/2011.  FINDINGS: Proximal gastric surgical clips or staples. 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 Unremarkable left kidney, ureters, urinary bladder, uterus, ovaries, pancreas and adrenal glands.  Currently, there is had a 2.1 x 1.3 cm oval, homogeneously enhancing mass in the lateral aspect of the inferior portion of the spleen. There is mild outward bulging of the splenic contour at that location, without significant change. There is  also a small cyst in the dome of the liver on the right, without significant change.  Small to moderate-sized hiatal hernia. No intestinal abnormalities. No evidence of appendicitis. Clear lung bases. Right iliac bone island. Mild lumbar and lower thoracic spine degenerative changes.  IMPRESSION: 1. No acute abnormality. 2. 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 3. 2.1 x 1.3 cm oval, homogeneously enhancing mass in the spleen. This is nonspecific and most likely benign, possibly an atypical hemangioma. 4. Small to moderate-sized hiatal hernia.   Electronically Signed   By: Claudie Revering M.D.   On: 06/09/2015 14:13   US Abdomen Limited Ruq  06/09/2015   CLINICAL DATA:  Right upper quadrant pain  EXAM: US ABDOMEN LIMITED - RIGHT UPPER QUADRANT  COMPARISON:  None.  FINDINGS: Gallbladder:  No gallstones or wall thickening visualized. No sonographic Murphy sign noted.  Common bile duct:  Diameter: 4.2 mm  Liver:  No focal lesion identified. Within normal limits in parenchymal echogenicity.  IMPRESSION: 1. No acute findings.  Normal appearance of the gallbladder.   Electronically Signed   By: Kerby Moors M.D.   On: 06/09/2015 20:01   I have personally reviewed and evaluated these images and lab results as part of my medical decision-making.   EKG Interpretation None      MDM   Final diagnoses:  RUQ pain  Right-sided low back pain without sciatica    Labs: urinalysis, lipase, CMP, CBC- no significant findings  Imaging: ultrasound abdomen right upper quadrant- No acute findings, CT Abd and pelvis- no acute findings; some chronic findings.  Consults:  Therapeutics: Percocet, Zofran  Discharge Meds:   Assessment/Plan: patient presents with abdominal pain, she's had a CT scan, right upper quadrant ultrasound that showed no significant findings. Patient does have some stable abnormalities of her abdomen. Uncertain why her primary care provider requested  she come to the emergency room. Patient was stable here in the ED, no significant findings on laboratory data. Patient treated here with pain medication, she did have some nausea and was given Zofran for that. She'll be discharged home with instructions to follow-up with her primary care provider tomorrow for further evaluation  and management and discussion of imaging studies.         Okey Regal, PA-C 06/10/15 [phone]

## 2015-06-09 NOTE — ED Notes (Signed)
Patient left at this time with all belongings. 

## 2015-06-09 NOTE — ED Notes (Signed)
Pt reports right lower abd pain radiating to lower abd for 6 days, had ct scan and xray today sent here for evaluation. Denies vomiting, loose stools today.

## 2015-06-09 NOTE — Discharge Instructions (Signed)
Abdominal Pain Many things can cause abdominal pain. Usually, abdominal pain is not caused by a disease and will improve without treatment. It can often be observed and treated at home. Your health care provider will do a physical exam and possibly order blood tests and X-rays to help determine the seriousness of your pain. However, in many cases, more time must pass before a clear cause of the pain can be found. Before that point, your health care provider may not know if you need more testing or further treatment. HOME CARE INSTRUCTIONS  Monitor your abdominal pain for any changes. The following actions may help to alleviate any discomfort you are experiencing:  Only take over-the-counter or prescription medicines as directed by your health care provider.  Do not take laxatives unless directed to do so by your health care provider.  Try a clear liquid diet (broth, tea, or water) as directed by your health care provider. Slowly move to a bland diet as tolerated. SEEK MEDICAL CARE IF:  You have unexplained abdominal pain.  You have abdominal pain associated with nausea or diarrhea.  You have pain when you urinate or have a bowel movement.  You experience abdominal pain that wakes you in the night.  You have abdominal pain that is worsened or improved by eating food.  You have abdominal pain that is worsened with eating fatty foods.  You have a fever. SEEK IMMEDIATE MEDICAL CARE IF:   Your pain does not go away within 2 hours.  You keep throwing up (vomiting).  Your pain is felt only in portions of the abdomen, such as the right side or the left lower portion of the abdomen.  You pass bloody or black tarry stools. MAKE SURE YOU:  Understand these instructions.   Will watch your condition.   Will get help right away if you are not doing well or get worse.  Document Released: 07/11/2005 Document Revised: 10/06/2013 Document Reviewed: 06/10/2013 [facility] Patient Information  2015 [place], [place]. This information is not intended to replace advice given to you by your health care provider. Make sure you discuss any questions you have with your health care provider.  Please follow-up with her primary care provider tomorrow morning for reevaluation. If new worsening signs or symptoms present please return immediately to the emergency room.

## 2015-06-09 NOTE — ED Provider Notes (Signed)
Medical screening examination/treatment/procedure(s) were conducted as a shared visit with non-physician practitioner(s) and myself.  I personally evaluated the patient during the encounter.   EKG Interpretation None     Patient here complaining of right lower abdominal and flank pain 6 days. Seen by physician today and had abdominal CT which was negative for any acute findings. Subsequent he had abdominal ultrasound here which was negative. On exam tender at the right flank area without rashes noted. Suspect musculoskeletal pain will treat symptomatically  Lacretia Leigh, MD 06/09/15 2230

## 2015-06-13 DIAGNOSIS — R161 Splenomegaly, not elsewhere classified: Secondary | ICD-10-CM | POA: Diagnosis not present

## 2015-06-13 DIAGNOSIS — R109 Unspecified abdominal pain: Secondary | ICD-10-CM | POA: Diagnosis not present

## 2015-06-13 DIAGNOSIS — M549 Dorsalgia, unspecified: Secondary | ICD-10-CM | POA: Diagnosis not present

## 2015-06-14 ENCOUNTER — Ambulatory Visit
Admission: RE | Admit: 2015-06-14 | Discharge: 2015-06-14 | Disposition: A | Discharge status: Home / Self Care | Payer: Medicare Other | Source: Ambulatory Visit | Attending: Family Medicine | Admitting: Family Medicine

## 2015-06-14 ENCOUNTER — Other Ambulatory Visit: Payer: Self-pay | Admitting: Family Medicine

## 2015-06-14 ENCOUNTER — Other Ambulatory Visit: Payer: Medicare Other

## 2015-06-14 DIAGNOSIS — M5135 Other intervertebral disc degeneration, thoracolumbar region: Secondary | ICD-10-CM | POA: Diagnosis not present

## 2015-06-14 DIAGNOSIS — M5489 Other dorsalgia: Secondary | ICD-10-CM

## 2015-06-14 DIAGNOSIS — M549 Dorsalgia, unspecified: Secondary | ICD-10-CM | POA: Diagnosis not present

## 2015-06-27 DIAGNOSIS — I10 Essential (primary) hypertension: Secondary | ICD-10-CM | POA: Diagnosis not present

## 2015-06-27 DIAGNOSIS — Z23 Encounter for immunization: Secondary | ICD-10-CM | POA: Diagnosis not present

## 2015-06-27 DIAGNOSIS — M549 Dorsalgia, unspecified: Secondary | ICD-10-CM | POA: Diagnosis not present

## 2015-06-27 DIAGNOSIS — M1711 Unilateral primary osteoarthritis, right knee: Secondary | ICD-10-CM | POA: Diagnosis not present

## 2015-06-28 DIAGNOSIS — H4011X3 Primary open-angle glaucoma, severe stage: Secondary | ICD-10-CM | POA: Diagnosis not present

## 2015-07-13 DIAGNOSIS — K449 Diaphragmatic hernia without obstruction or gangrene: Secondary | ICD-10-CM | POA: Diagnosis not present

## 2015-07-13 DIAGNOSIS — K219 Gastro-esophageal reflux disease without esophagitis: Secondary | ICD-10-CM | POA: Diagnosis not present

## 2015-08-11 ENCOUNTER — Other Ambulatory Visit: Payer: Self-pay | Admitting: Family Medicine

## 2015-08-11 ENCOUNTER — Ambulatory Visit
Admission: RE | Admit: 2015-08-11 | Discharge: 2015-08-11 | Disposition: A | Discharge status: Home / Self Care | Payer: PRIVATE HEALTH INSURANCE | Source: Ambulatory Visit | Attending: Family Medicine | Admitting: Family Medicine

## 2015-08-11 DIAGNOSIS — Z23 Encounter for immunization: Secondary | ICD-10-CM | POA: Diagnosis not present

## 2015-08-11 DIAGNOSIS — Z0000 Encounter for general adult medical examination without abnormal findings: Secondary | ICD-10-CM | POA: Diagnosis not present

## 2015-08-11 DIAGNOSIS — M179 Osteoarthritis of knee, unspecified: Secondary | ICD-10-CM

## 2015-08-11 DIAGNOSIS — E782 Mixed hyperlipidemia: Secondary | ICD-10-CM | POA: Diagnosis not present

## 2015-08-11 DIAGNOSIS — K219 Gastro-esophageal reflux disease without esophagitis: Secondary | ICD-10-CM | POA: Diagnosis not present

## 2015-08-11 DIAGNOSIS — I10 Essential (primary) hypertension: Secondary | ICD-10-CM | POA: Diagnosis not present

## 2015-08-18 DIAGNOSIS — M1712 Unilateral primary osteoarthritis, left knee: Secondary | ICD-10-CM | POA: Diagnosis not present

## 2015-08-18 DIAGNOSIS — M1711 Unilateral primary osteoarthritis, right knee: Secondary | ICD-10-CM | POA: Diagnosis not present

## 2015-09-14 DIAGNOSIS — M1711 Unilateral primary osteoarthritis, right knee: Secondary | ICD-10-CM | POA: Diagnosis not present

## 2015-09-14 DIAGNOSIS — M1712 Unilateral primary osteoarthritis, left knee: Secondary | ICD-10-CM | POA: Diagnosis not present

## 2015-09-21 DIAGNOSIS — M1712 Unilateral primary osteoarthritis, left knee: Secondary | ICD-10-CM | POA: Diagnosis not present

## 2015-09-21 DIAGNOSIS — M1711 Unilateral primary osteoarthritis, right knee: Secondary | ICD-10-CM | POA: Diagnosis not present

## 2015-09-21 DIAGNOSIS — F331 Major depressive disorder, recurrent, moderate: Secondary | ICD-10-CM | POA: Diagnosis not present

## 2015-09-28 DIAGNOSIS — M170 Bilateral primary osteoarthritis of knee: Secondary | ICD-10-CM | POA: Diagnosis not present

## 2015-09-30 ENCOUNTER — Encounter: Payer: Self-pay | Admitting: Cardiovascular Disease

## 2015-09-30 ENCOUNTER — Encounter: Payer: Self-pay | Admitting: Internal Medicine

## 2015-10-05 DIAGNOSIS — M170 Bilateral primary osteoarthritis of knee: Secondary | ICD-10-CM | POA: Diagnosis not present

## 2015-10-11 ENCOUNTER — Encounter: Payer: Self-pay | Admitting: Gastroenterology

## 2015-10-11 ENCOUNTER — Ambulatory Visit (INDEPENDENT_AMBULATORY_CARE_PROVIDER_SITE_OTHER): Payer: Medicare Other | Admitting: Gastroenterology

## 2015-10-11 VITALS — BP 140/72 | HR 64 | Ht 68.5 in | Wt 187.4 lb

## 2015-10-11 DIAGNOSIS — R0989 Other specified symptoms and signs involving the circulatory and respiratory systems: Secondary | ICD-10-CM

## 2015-10-11 DIAGNOSIS — R05 Cough: Secondary | ICD-10-CM

## 2015-10-11 DIAGNOSIS — R12 Heartburn: Secondary | ICD-10-CM

## 2015-10-11 DIAGNOSIS — R059 Cough, unspecified: Secondary | ICD-10-CM

## 2015-10-11 DIAGNOSIS — K219 Gastro-esophageal reflux disease without esophagitis: Secondary | ICD-10-CM

## 2015-10-11 DIAGNOSIS — F458 Other somatoform disorders: Secondary | ICD-10-CM | POA: Diagnosis not present

## 2015-10-11 NOTE — Patient Instructions (Addendum)
You have been scheduled for an esophageal manometry at [facility] on 11/14/2015   at  10:30am  . Please arrive 30 minutes prior to your procedure for registration. You will need to go to outpatient registration (1st floor of the hospital) first. Make certain to bring your insurance cards as well as a complete list of medications.  Please remember the following:  1) Nothing to eat or drink after 12:00 midnight on the night before your test.  2) Hold all diabetic medications/insulin the morning of the test. You may eat and take             your medications after the test.  3) Stay ON PPI  ( Omeprazole) Per Dr Silverio Decamp  4) For 2 days prior to your test, do not take: Reglan, Tagamet, Zantac, Axid or Pepcid.  5) You MAY use an antacid such as Rolaids or Tums up to 12 hours prior to your test.  It will take at least 2 weeks to receive the results of this test from your physician. ------------------------------------------ ABOUT ESOPHAGEAL MANOMETRY Esophageal manometry (muh-NOM-uh-tree) is a test that gauges how well your esophagus works. Your esophagus is the long, muscular tube that connects your throat to your stomach. Esophageal manometry measures the rhythmic muscle contractions (peristalsis) that occur in your esophagus when you swallow. Esophageal manometry also measures the coordination and force exerted by the muscles of your esophagus.  During esophageal manometry, a thin, flexible tube (catheter) that contains sensors is passed through your nose, down your esophagus and into your stomach. Esophageal manometry can be helpful in diagnosing some mostly uncommon disorders that affect your esophagus.  Why it's done Esophageal manometry is used to evaluate the movement (motility) of food through the esophagus and into the stomach. The test measures how well the circular bands of muscle (sphincters) at the top and bottom of your esophagus open and close, as well as the pressure, strength  and pattern of the wave of esophageal muscle contractions that moves food along.  What you can expect Esophageal manometry is an outpatient procedure done without sedation. Most people tolerate it well. You may be asked to change into a hospital gown before the test starts.  During esophageal manometry  While you are sitting up, a member of your health care team sprays your throat with a numbing medication or puts numbing gel in your nose or both.  A catheter is guided through your nose into your esophagus. The catheter may be sheathed in a water-filled sleeve. It doesn't interfere with your breathing. However, your eyes may water, and you may gag. You may have a slight nosebleed from irritation.  After the catheter is in place, you may be asked to lie on your back on an exam table, or you may be asked to remain seated.  You then swallow small sips of water. As you do, a computer connected to the catheter records the pressure, strength and pattern of your esophageal muscle contractions.  During the test, you'll be asked to breathe slowly and smoothly, remain as still as possible, and swallow only when you're asked to do so.  A member of your health care team may move the catheter down into your stomach while the catheter continues its measurements.  The catheter then is slowly withdrawn. The test usually lasts 20 to 30 minutes.  After esophageal manometry  When your esophageal manometry is complete, you may return to your normal activities  This test typically takes 30-45 minutes to  complete. ________________________________________________________________________________

## 2015-10-11 NOTE — Progress Notes (Signed)
[name]    [id number]    Jun 11, 1941  Primary Care 5, MD  Referring Physician: Fanny Bien, MD [address] [place], [place] [address]  Chief complaint: GERD   HPI: 74-year-old female with history of chronic GERD here for follow-up visit. She could not afford Dexilant, hence switched to Prilosec 40 mg twice daily. She reports improvement in heartburn with PPI but continues to have persistent cough which is progressively worse in the past year. She has followed up with pulmonary and did not have any improvement with inhalers. Denies any dysphagia or odynophagia but does have globus sensation and intermittent sore throat. Her weight is stable Last endoscopy in 2011 showed hiatal hernia otherwise unremarkable and last colonoscopy in 2012 showed diverticulosis and hemorrhoids   Outpatient Encounter Prescriptions as of 10/11/2015  Medication Sig  . acetaminophen (TYLENOL) 325 MG tablet Take 650 mg by mouth as needed.  . bimatoprost (LUMIGAN) 0.03 % ophthalmic solution Place 1 drop into both eyes at bedtime.  . Cholecalciferol (VITAMIN D) 2000 UNITS CAPS Take 6,000 Units by mouth daily.  [name] FLUoxetine (PROZAC) 20 MG tablet Take 20 mg by mouth daily.  [name] losartan (COZAAR) 50 MG tablet Take 25 mg by mouth daily.  . montelukast (SINGULAIR) 10 MG tablet Take 10 mg by mouth at bedtime.  [name] omeprazole (PRILOSEC) 40 MG capsule Take 40 mg by mouth daily as needed (heartburn).  . rosuvastatin (CRESTOR) 20 MG tablet Take 20 mg by mouth daily.  . timolol (BETIMOL) 0.5 % ophthalmic solution Place 1 drop into both eyes 2 (two) times daily.  [name] tiZANidine (ZANAFLEX) 4 MG tablet Take 4 mg by mouth as needed for muscle spasms.  . [DISCONTINUED] HYDROcodone-acetaminophen (NORCO/VICODIN) 5-325 MG per tablet Take 2 tablets by mouth every 4 (four) hours as needed.  . [DISCONTINUED] ondansetron (ZOFRAN) 4 MG tablet Take 1 tablet (4 mg total) by mouth every 6 (six)  hours.   No facility-administered encounter medications on file as of 10/11/2015.    Allergies as of 10/11/2015 - Review Complete 10/11/2015  Allergen Reaction Noted  . Adalat [nifedipine]  09/17/2013  . Hydrocodone Itching 09/18/2013  . Meperidine hcl    . Naproxen Nausea And Vomiting     Past Medical History  Diagnosis Date  . GERD (gastroesophageal reflux disease)     subsequent Nissen Fundoplication  . Hyperlipidemia   . Depression   . Degenerative joint disease   . Osteoporosis   . Arthritis   . Glaucoma   . Sleep apnea   . Diverticulosis 2003  . Hx of adenomatous colonic polyps 07/02/02  . Chronic back pain   . Fibromyalgia   . Esophageal dysmotility   . Hiatal hernia 11/08/09  . Hemorrhoids   . Hypertension   . Hearing loss   . Rectal bleeding     from hemorrhoids.    . Anal or rectal pain     sometimes  . Nausea   . Thrombocytopenia ([place])   . Elevated total protein   . Hypercalcemia   . Hyperlipidemia     Past Surgical History  Procedure Laterality Date  . Nissen fundoplication  [id number]    with subsequent takedown in 2009  . Gastric resection  2009  . Carotid doppler  03/31/2007    Bilateral ICAs - no evidence of significant diameter reduction, dissectin, tortuosity, FMD, or any other vascular abnormality.  . Cardiac catheterization  03/14/1992  Normal cardiac cath. Normal LV function.  . Cardiovascular stress test  01/22/2011    No scintigraphic evidence of inducible ischemia.  . Transthoracic echocardiogram  12/21/2010    EF 60%, moderate LVH,     Family History  Problem Relation Age of Onset  . Heart disease Mother   . Hypertension Mother   . Diabetes Mother   . Diabetes Father   . Kidney disease Father   . Breast cancer Daughter   . Colon cancer Neg Hx   . Cancer Maternal Aunt     stomach  . Cancer Maternal Uncle     leukemia  . Cancer Maternal Uncle     prostate  . Stroke Brother   . Cancer Maternal Grandmother   . Kidney disease  Maternal Grandfather     Social History   Social History  . Marital Status: Married    Spouse Name: N/A  . Number of Children: N/A  . Years of Education: N/A   Occupational History  .      retired Therapist, art.    Social History Main Topics  . Smoking status: Never Smoker   . Smokeless tobacco: Never Used  . Alcohol Use: No  . Drug Use: No  . Sexual Activity: Not on file   Other Topics Concern  . Not on file   Social History Narrative   Husband, Pella Origer is Next of Kin. Cell # [phone]      Review of systems: Review of Systems  Constitutional: Negative for fever and chills.  HENT: Negative.   Eyes: Negative for blurred vision.  Respiratory: Negative for cough, shortness of breath and wheezing.   Cardiovascular: Negative for chest pain and palpitations.  Gastrointestinal: as per HPI Genitourinary: Negative for dysuria, urgency, frequency and hematuria.  Musculoskeletal: Negative for myalgias, back pain and joint pain.  Skin: Negative for itching and rash.  Neurological: Negative for dizziness, tremors, focal weakness, seizures and loss of consciousness.  Endo/Heme/Allergies: Negative for environmental allergies.  Psychiatric/Behavioral: Negative for depression, suicidal ideas and hallucinations.  All other systems reviewed and are negative.   Physical Exam: Filed Vitals:   10/11/15 0941  BP: 140/72  Pulse: 64   Gen:      No acute distress HEENT:  EOMI, sclera anicteric Neck:     No masses; no thyromegaly Lungs:    Clear to auscultation bilaterally; normal respiratory effort CV:         Regular rate and rhythm; no murmurs Abd:      + bowel sounds; soft, non-tender; no palpable masses, no distension Ext:    No edema; adequate peripheral perfusion Skin:      Warm and dry; no rash Neuro: alert and oriented x 3 Psych: normal mood and affect  Data Reviewed:  Reviewed chart and pertinent GI workup   Assessment and  Plan/Recommendations: 74 year old female with history of chronic GERD s/p Nissen Fundoplication [id number], failed in 2003, s/p takedown in 2009 here with complaints of persistent symptoms of reflux predominantly cough She does have improvement in heartburn with PPI Continue PPI twice daily We'll obtain esophageal manometry with 24-hour pH impedance to assess for refractory GERD symptoms Discussed antireflux measures Return in 3 months  K. Denzil Magnuson , MD [phone] Mon-Fri 8a-5p [phone] after 5p, weekends, holidays

## 2015-10-12 DIAGNOSIS — M170 Bilateral primary osteoarthritis of knee: Secondary | ICD-10-CM | POA: Diagnosis not present

## 2015-10-19 DIAGNOSIS — H524 Presbyopia: Secondary | ICD-10-CM | POA: Diagnosis not present

## 2015-10-19 DIAGNOSIS — H401133 Primary open-angle glaucoma, bilateral, severe stage: Secondary | ICD-10-CM | POA: Diagnosis not present

## 2015-10-20 ENCOUNTER — Ambulatory Visit: Payer: PRIVATE HEALTH INSURANCE | Admitting: Podiatry

## 2015-10-24 ENCOUNTER — Ambulatory Visit (INDEPENDENT_AMBULATORY_CARE_PROVIDER_SITE_OTHER): Payer: Medicare Other | Admitting: Sports Medicine

## 2015-10-24 ENCOUNTER — Encounter: Payer: Self-pay | Admitting: Sports Medicine

## 2015-10-24 DIAGNOSIS — M2142 Flat foot [pes planus] (acquired), left foot: Secondary | ICD-10-CM

## 2015-10-24 DIAGNOSIS — M2141 Flat foot [pes planus] (acquired), right foot: Secondary | ICD-10-CM

## 2015-10-24 DIAGNOSIS — M722 Plantar fascial fibromatosis: Secondary | ICD-10-CM | POA: Diagnosis not present

## 2015-10-24 DIAGNOSIS — M79671 Pain in right foot: Secondary | ICD-10-CM | POA: Diagnosis not present

## 2015-10-24 DIAGNOSIS — M779 Enthesopathy, unspecified: Secondary | ICD-10-CM

## 2015-10-24 MED ORDER — METHYLPREDNISOLONE 4 MG PO TBPK: ORAL_TABLET | ORAL | Status: DC

## 2015-10-24 NOTE — Progress Notes (Signed)
Patient ID: [name], female   DOB: 07-20-1941, 75 y.o.   MRN: [id number] Subjective: [name] is a 75 y.o. female patient who presents to office for evaluation of Right foot pain. Patient complains of progressive pain especially over the last few months as a dull ache at the top and heel of right foot that has gotten worse as she has been getting treatment for her knees. Patient denies any other pedal complaints. Denies injury/trip/fall/sprain/any causative factors.   Patient Active Problem List   Diagnosis Date Noted  . Essential hypertension 11/24/2014  . Thyroid nodule 06/11/2012  . Thrombocytopenia ([place])   . Elevated total protein   . Hypercalcemia   . HYPERLIPIDEMIA 12/14/2009  . HEMORRHOIDS 12/14/2009  . DIVERTICULOSIS, COLON 12/14/2009  . HIP PAIN, CHRONIC 12/14/2009  . BACK PAIN, CHRONIC 12/14/2009  . FIBROMYALGIA 12/14/2009  . OSTEOPOROSIS 12/14/2009  . SLEEP APNEA 12/14/2009  . COLONIC POLYPS, ADENOMATOUS, HX OF 12/14/2009  . GASTRIC POLYP, HX OF 12/14/2009  . HIATAL HERNIA 10/04/2009  . DEPRESSION 09/29/2009  . REFLUX ESOPHAGITIS 09/29/2009  . GERD 09/29/2009  . CONSTIPATION 09/29/2009  . DYSPHAGIA 09/29/2009   Current Outpatient Prescriptions on File Prior to Visit  Medication Sig Dispense Refill  . acetaminophen (TYLENOL) 325 MG tablet Take 650 mg by mouth as needed.    . bimatoprost (LUMIGAN) 0.03 % ophthalmic solution Place 1 drop into both eyes at bedtime.    . Cholecalciferol (VITAMIN D) 2000 UNITS CAPS Take 6,000 Units by mouth daily.    [name] FLUoxetine (PROZAC) 20 MG tablet Take 20 mg by mouth daily.    [name] losartan (COZAAR) 50 MG tablet Take 25 mg by mouth daily.    . montelukast (SINGULAIR) 10 MG tablet Take 10 mg by mouth at bedtime.    [name] omeprazole (PRILOSEC) 40 MG capsule Take 40 mg by mouth daily as needed (heartburn).    . rosuvastatin (CRESTOR) 20 MG tablet Take 20 mg by mouth daily.    . timolol (BETIMOL) 0.5 % ophthalmic solution Place 1 drop into  both eyes 2 (two) times daily.    [name] tiZANidine (ZANAFLEX) 4 MG tablet Take 4 mg by mouth as needed for muscle spasms.     No current facility-administered medications on file prior to visit.   Allergies  Allergen Reactions  . Adalat [Nifedipine]   . Hydrocodone Itching  . Meperidine Hcl     Demerol causes hallucinations  . Naproxen Nausea And Vomiting    Objective:  General: Alert and oriented x3 in no acute distress  Dermatology: No open lesions bilateral lower extremities, no webspace macerations, no ecchymosis bilateral, all nails x 10 are well manicured.  Vascular: Dorsalis Pedis 1/4 and Posterior Tibial pedal pulses 2/4, Capillary Fill Time 3 seconds,(+) pedal hair growth bilateral, no edema bilateral lower extremities, Temperature gradient within normal limits.  Neurology: Johney Maine sensation intact via light touch bilateral.   Musculoskeletal: Mild tenderness with palpation at Midtarsal joint and insertion of plantar fascia on right, No pain with tuning fork to calcaneous on right,No pain with calf compression bilateral. There is decreased ankle rom with knee extending  vs flexed resembling gastroc equnius bilateral, Subtalar joint range of motion is within normal limits, there is no 1st ray hypermobility noted bilateral, mild bunion and decreased 1st MPJ rom Right>Left with functional limitus noted on weightbearing exam. Pes planus foot type. Strength within normal limits in all groups bilateral.   Gait: Antalgic gait with increased medial arch collapse and pronatory  influence noted on Right> Left foot with medial 1st MPJ roll off in toe-off.   Assessment and Plan: Problem List Items Addressed This Visit    None    Visit Diagnoses    Plantar fasciitis, right    -  Primary    Relevant Medications    methylPREDNISolone (MEDROL DOSEPAK) 4 MG TBPK tablet    Capsulitis        Relevant Medications    methylPREDNISolone (MEDROL DOSEPAK) 4 MG TBPK tablet    Pes planus of both feet         Foot pain, right        Relevant Medications    methylPREDNISolone (MEDROL DOSEPAK) 4 MG TBPK tablet       -Complete examination performed -Discussed treatement options for fasciitis and midtarsal joint capsulitis related to structural pes planus -Rx Medrol dose pack -Patient to take tylenol as needed for pain; no anti-inflammatories given due to patient's hx of GI issues -Dispensed fascial brace and recommended gentle stretching exercises -Recommend ice and good supportive shoes daily -Patient to bring custom orthotics at next encounter for evaluation  -Patient to return to office in 4 weeks or sooner if condition worsens.  Landis Martins, DPM

## 2015-10-24 NOTE — Patient Instructions (Signed)

## 2015-11-11 DIAGNOSIS — J309 Allergic rhinitis, unspecified: Secondary | ICD-10-CM | POA: Diagnosis not present

## 2015-11-11 DIAGNOSIS — M170 Bilateral primary osteoarthritis of knee: Secondary | ICD-10-CM | POA: Diagnosis not present

## 2015-11-11 DIAGNOSIS — K219 Gastro-esophageal reflux disease without esophagitis: Secondary | ICD-10-CM | POA: Diagnosis not present

## 2015-11-11 DIAGNOSIS — I10 Essential (primary) hypertension: Secondary | ICD-10-CM | POA: Diagnosis not present

## 2015-11-14 ENCOUNTER — Ambulatory Visit (HOSPITAL_COMMUNITY)
Admission: RE | Admit: 2015-11-14 | Discharge: 2015-11-14 | Disposition: A | Discharge status: Home / Self Care | Payer: Medicare Other | Source: Ambulatory Visit | Attending: Gastroenterology | Admitting: Gastroenterology

## 2015-11-14 ENCOUNTER — Encounter (HOSPITAL_COMMUNITY)
Admission: RE | Disposition: A | Discharge status: Home / Self Care | Payer: Self-pay | Source: Ambulatory Visit | Attending: Gastroenterology

## 2015-11-14 DIAGNOSIS — R05 Cough: Secondary | ICD-10-CM | POA: Diagnosis not present

## 2015-11-14 DIAGNOSIS — K219 Gastro-esophageal reflux disease without esophagitis: Secondary | ICD-10-CM | POA: Diagnosis not present

## 2015-11-14 DIAGNOSIS — R059 Cough, unspecified: Secondary | ICD-10-CM | POA: Insufficient documentation

## 2015-11-14 HISTORY — PX: ESOPHAGEAL MANOMETRY: SHX5429

## 2015-11-14 SURGERY — MANOMETRY, ESOPHAGUS

## 2015-11-14 MED ORDER — LIDOCAINE VISCOUS 2 % MT SOLN
OROMUCOSAL | Status: AC
  Filled 2015-11-14: qty 15

## 2015-11-14 SURGICAL SUPPLY — 2 items
FACESHIELD LNG OPTICON STERILE (SAFETY) IMPLANT
GLOVE BIO SURGEON STRL SZ8 (GLOVE) ×6 IMPLANT

## 2015-11-14 NOTE — Progress Notes (Signed)
Esophageal Manometry done per protocol. Pt tolerated well and was without complication. Report to be sent to Dr. Silverio Decamp.

## 2015-11-15 ENCOUNTER — Encounter (HOSPITAL_COMMUNITY): Payer: Self-pay | Admitting: Gastroenterology

## 2015-11-21 ENCOUNTER — Ambulatory Visit (INDEPENDENT_AMBULATORY_CARE_PROVIDER_SITE_OTHER): Payer: Medicare Other | Admitting: Sports Medicine

## 2015-11-21 ENCOUNTER — Encounter: Payer: Self-pay | Admitting: Sports Medicine

## 2015-11-21 VITALS — BP 143/71 | HR 58 | Resp 16

## 2015-11-21 DIAGNOSIS — M722 Plantar fascial fibromatosis: Secondary | ICD-10-CM

## 2015-11-21 DIAGNOSIS — M79671 Pain in right foot: Secondary | ICD-10-CM | POA: Diagnosis not present

## 2015-11-21 DIAGNOSIS — M2141 Flat foot [pes planus] (acquired), right foot: Secondary | ICD-10-CM

## 2015-11-21 DIAGNOSIS — M2142 Flat foot [pes planus] (acquired), left foot: Secondary | ICD-10-CM | POA: Diagnosis not present

## 2015-11-21 DIAGNOSIS — M779 Enthesopathy, unspecified: Secondary | ICD-10-CM

## 2015-11-21 MED ORDER — TRIAMCINOLONE ACETONIDE 10 MG/ML IJ SUSP: 10.0000 mg | Freq: Once | INTRAMUSCULAR | Status: DC

## 2015-11-21 NOTE — Progress Notes (Signed)
Patient ID: [name], female   DOB: Jul 23, 1941, 75 y.o.   MRN: [id number]  Subjective: [name] is a 75 y.o. female patient who returns to office for evaluation of Right>left foot pain. Patient states that her right heel is feeling a little better but still has pain especially with getting out of bed in the morning. Patient also admits to occassional left heel pain as well which as started a little; states that she think that she has changed the way she walks after knee injections. Patient did not pick up her medication as I ordered for her last visit because she didn't have extra $. Patient denies any other pedal complaints.   Patient Active Problem List   Diagnosis Date Noted  . Essential hypertension 11/24/2014  . Thyroid nodule 06/11/2012  . Thrombocytopenia ([place])   . Elevated total protein   . Hypercalcemia   . HYPERLIPIDEMIA 12/14/2009  . HEMORRHOIDS 12/14/2009  . DIVERTICULOSIS, COLON 12/14/2009  . HIP PAIN, CHRONIC 12/14/2009  . BACK PAIN, CHRONIC 12/14/2009  . FIBROMYALGIA 12/14/2009  . OSTEOPOROSIS 12/14/2009  . SLEEP APNEA 12/14/2009  . COLONIC POLYPS, ADENOMATOUS, HX OF 12/14/2009  . GASTRIC POLYP, HX OF 12/14/2009  . HIATAL HERNIA 10/04/2009  . DEPRESSION 09/29/2009  . REFLUX ESOPHAGITIS 09/29/2009  . GERD 09/29/2009  . CONSTIPATION 09/29/2009  . DYSPHAGIA 09/29/2009   Current Outpatient Prescriptions on File Prior to Visit  Medication Sig Dispense Refill  . acetaminophen (TYLENOL) 325 MG tablet Take 650 mg by mouth as needed.    . bimatoprost (LUMIGAN) 0.03 % ophthalmic solution Place 1 drop into both eyes at bedtime.    . Cholecalciferol (VITAMIN D) 2000 UNITS CAPS Take 6,000 Units by mouth daily.    [name] FLUoxetine (PROZAC) 20 MG tablet Take 20 mg by mouth daily.    [name] losartan (COZAAR) 50 MG tablet Take 25 mg by mouth daily.    . methylPREDNISolone (MEDROL DOSEPAK) 4 MG TBPK tablet Take as directed 21 tablet 0  . montelukast (SINGULAIR) 10 MG tablet Take 10 mg  by mouth at bedtime.    [name] omeprazole (PRILOSEC) 40 MG capsule Take 40 mg by mouth daily as needed (heartburn).    . rosuvastatin (CRESTOR) 20 MG tablet Take 20 mg by mouth daily.    . timolol (BETIMOL) 0.5 % ophthalmic solution Place 1 drop into both eyes 2 (two) times daily.    [name] tiZANidine (ZANAFLEX) 4 MG tablet Take 4 mg by mouth as needed for muscle spasms.     No current facility-administered medications on file prior to visit.   Allergies  Allergen Reactions  . Adalat [Nifedipine]   . Hydrocodone Itching  . Meperidine Hcl     Demerol causes hallucinations  . Naproxen Nausea And Vomiting    Objective:  General: Alert and oriented x3 in no acute distress  Dermatology: No open lesions bilateral lower extremities, no webspace macerations, no ecchymosis bilateral, all nails x 10 are well manicured.  Vascular: Dorsalis Pedis 1/4 and Posterior Tibial pedal pulses 2/4, Capillary Fill Time 3 seconds,(+) pedal hair growth bilateral, no edema bilateral lower extremities, + hyperpigmentation bilateral, Temperature gradient within normal limits.  Neurology: Johney Maine sensation intact via light touch bilateral.   Musculoskeletal: Mild tenderness with palpation at medial ankle gutter and insertion of plantar fascia on right>left, No pain with tuning fork to calcaneous on right,No pain with calf compression bilateral. There is decreased ankle rom with knee extending  vs flexed resembling gastroc equnius bilateral, Subtalar joint  range of motion is within normal limits, there is no 1st ray hypermobility noted bilateral, mild bunion and decreased 1st MPJ rom Right>Left with functional limitus noted on weightbearing exam. Pes planus foot type. Strength within normal limits in all groups bilateral.   Assessment and Plan: Problem List Items Addressed This Visit    None    Visit Diagnoses    Plantar fasciitis, right    -  Primary    Capsulitis        Relevant Medications    triamcinolone acetonide  (KENALOG) 10 MG/ML injection 10 mg    Pes planus of both feet        Foot pain, right        Relevant Medications    triamcinolone acetonide (KENALOG) 10 MG/ML injection 10 mg      -Complete examination performed -Discussed treatement options for fasciitis and joint capsulitis related to structural pes planus -After oral consent and aseptic prep, injected a mixture containing 1 ml of 2%  plain lidocaine, 1 ml 0.5% plain marcaine, 0.5 ml of kenalog 10 and 0.5 ml of dexamethasone phosphate into right medial ankle gutter; area of max tenderness on today's exam. Post-injection care discussed with patient.  -Advised patient to pick up Medrol dose pack -Patient to take tylenol as needed for pain; no anti-inflammatories given due to patient's hx of GI issues -Dispensed night splint to use daily -Cont with fascial brace and recommended gentle stretching exercises -Recommend ice and good supportive shoes daily -Patient could not find custom orthotics to bring for evaluation; recommend try OTC inserts -Patient to return to office in 3 weeks or sooner if condition worsens.  Landis Martins, DPM

## 2015-12-01 ENCOUNTER — Telehealth: Payer: Self-pay | Admitting: Gastroenterology

## 2015-12-01 NOTE — Telephone Encounter (Signed)
Appointment scheduled for 01/17/16 and I informed her that you will be calling her and she states that it is ok to leave a detailed message if she doesn't answer.

## 2015-12-01 NOTE — Telephone Encounter (Signed)
Next available. I will be calling her with results and recommendations before that date. We will adjust the appointment if needed. At her last appointment she was supposed to schedule a 3 month follow up.

## 2015-12-01 NOTE — Telephone Encounter (Signed)
Left message for patient to return my call.

## 2015-12-12 ENCOUNTER — Ambulatory Visit (INDEPENDENT_AMBULATORY_CARE_PROVIDER_SITE_OTHER): Payer: Medicare Other | Admitting: Sports Medicine

## 2015-12-12 ENCOUNTER — Encounter: Payer: Self-pay | Admitting: Sports Medicine

## 2015-12-12 DIAGNOSIS — M2142 Flat foot [pes planus] (acquired), left foot: Secondary | ICD-10-CM

## 2015-12-12 DIAGNOSIS — M79671 Pain in right foot: Secondary | ICD-10-CM | POA: Diagnosis not present

## 2015-12-12 DIAGNOSIS — M79672 Pain in left foot: Secondary | ICD-10-CM | POA: Diagnosis not present

## 2015-12-12 DIAGNOSIS — M2141 Flat foot [pes planus] (acquired), right foot: Secondary | ICD-10-CM | POA: Diagnosis not present

## 2015-12-12 DIAGNOSIS — M722 Plantar fascial fibromatosis: Secondary | ICD-10-CM | POA: Diagnosis not present

## 2015-12-12 NOTE — Progress Notes (Signed)
Patient ID: SI FORBES, female   DOB: Feb 06, 1941, 75 y.o.   MRN: [id number]  Subjective: [name] is a 75 y.o. female patient who returns to office for evaluation of Right>left foot pain. 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 Patient states that she is good the 1st 4 hours of work but afterwards she is in a lot of pain. Patient states that the steroid dose pack and injection helped; does not want another. Patient denies any other pedal complaints.   Patient Active Problem List   Diagnosis Date Noted  . Essential hypertension 11/24/2014  . Thyroid nodule 06/11/2012  . Thrombocytopenia ([place])   . Elevated total protein   . Hypercalcemia   . HYPERLIPIDEMIA 12/14/2009  . HEMORRHOIDS 12/14/2009  . DIVERTICULOSIS, COLON 12/14/2009  . HIP PAIN, CHRONIC 12/14/2009  . BACK PAIN, CHRONIC 12/14/2009  . FIBROMYALGIA 12/14/2009  . OSTEOPOROSIS 12/14/2009  . SLEEP APNEA 12/14/2009  . COLONIC POLYPS, ADENOMATOUS, HX OF 12/14/2009  . GASTRIC POLYP, HX OF 12/14/2009  . HIATAL HERNIA 10/04/2009  . DEPRESSION 09/29/2009  . REFLUX ESOPHAGITIS 09/29/2009  . GERD 09/29/2009  . CONSTIPATION 09/29/2009  . DYSPHAGIA 09/29/2009   Current Outpatient Prescriptions on File Prior to Visit  Medication Sig Dispense Refill  . acetaminophen (TYLENOL) 325 MG tablet Take 650 mg by mouth as needed.    . bimatoprost (LUMIGAN) 0.03 % ophthalmic solution Place 1 drop into both eyes at bedtime.    . Cholecalciferol (VITAMIN D) 2000 UNITS CAPS Take 6,000 Units by mouth daily.    [name] FLUoxetine (PROZAC) 20 MG tablet Take 20 mg by mouth daily.    [name] losartan (COZAAR) 50 MG tablet Take 25 mg by mouth daily.    . methylPREDNISolone (MEDROL DOSEPAK) 4 MG TBPK tablet Take as directed 21 tablet 0  . montelukast (SINGULAIR) 10 MG tablet Take 10 mg by mouth at bedtime.    [name] omeprazole (PRILOSEC) 40 MG capsule Take 40 mg by  mouth daily as needed (heartburn).    . rosuvastatin (CRESTOR) 20 MG tablet Take 20 mg by mouth daily.    . timolol (BETIMOL) 0.5 % ophthalmic solution Place 1 drop into both eyes 2 (two) times daily.    [name] tiZANidine (ZANAFLEX) 4 MG tablet Take 4 mg by mouth as needed for muscle spasms.     Current Facility-Administered Medications on File Prior to Visit  Medication Dose Route Frequency Provider Last Rate Last Dose  . triamcinolone acetonide (KENALOG) 10 MG/ML injection 10 mg  10 mg Other Once Landis Martins, DPM       Allergies  Allergen Reactions  . Adalat [Nifedipine]   . Hydrocodone Itching  . Meperidine Hcl     Demerol causes hallucinations  . Naproxen Nausea And Vomiting    Objective:  General: Alert and oriented x3 in no acute distress  Dermatology: No open lesions bilateral lower extremities, no webspace macerations, no ecchymosis bilateral, all nails x 10 are well manicured.  Vascular: Dorsalis Pedis 1/4 and Posterior Tibial pedal pulses 2/4, Capillary Fill Time 3 seconds,(+) pedal hair growth bilateral, no edema bilateral lower extremities, + hyperpigmentation bilateral, Temperature gradient within normal limits.  Neurology: Johney Maine sensation intact via light touch bilateral.   Musculoskeletal: No tenderness with palpation at medial ankle gutter and decreased pain to palpation at insertion of plantar fascia on right>left, No pain with tuning fork to  calcaneous on right,No pain with calf compression bilateral. There is decreased ankle rom with knee extending  vs flexed resembling gastroc equnius bilateral, Subtalar joint range of motion is within normal limits, there is no 1st ray hypermobility noted bilateral, mild bunion and decreased 1st MPJ rom Right>Left with functional limitus noted on weightbearing exam. Pes planus foot type. Strength within normal limits in all groups bilateral.   Assessment and Plan: Problem List Items Addressed This Visit    None    Visit Diagnoses     Plantar fasciitis, right    -  Primary    Pes planus of both feet        Foot pain, bilateral          -Complete examination performed -Discussed treatement options for fasciitis and structural pes planus -No injection at this encounter; patient declined -Patient to take tylenol as needed for pain; no anti-inflammatories given due to patient's hx of GI issues -Cont with night splint to use daily -Cont with fascial brace; a new proper fitting brace was given for the right and a brace for the left as well.  -Cont gentle stretching exercises -Recommend ice and good supportive shoes daily; Recommend ice during break at work and change shoes as needed -Recommend OTC inserts -Patient to return to office in 6 weeks or sooner if condition worsens.  Landis Martins, DPM

## 2015-12-14 DIAGNOSIS — R059 Cough, unspecified: Secondary | ICD-10-CM | POA: Insufficient documentation

## 2015-12-14 DIAGNOSIS — R05 Cough: Secondary | ICD-10-CM | POA: Insufficient documentation

## 2015-12-22 ENCOUNTER — Other Ambulatory Visit: Payer: Self-pay

## 2015-12-22 ENCOUNTER — Telehealth: Payer: Self-pay | Admitting: Gastroenterology

## 2015-12-22 NOTE — Telephone Encounter (Signed)
She is aware of her normal esophageal manometry. Her follow up appointment is in April. She is crying because of her cough. She is on the PPI BID, following anti-reflux measures and taking allergy medications. Do I bring her in with an APP?

## 2015-12-22 NOTE — Telephone Encounter (Signed)
Appointment scheduled.

## 2015-12-22 NOTE — Telephone Encounter (Signed)
Ok to schedule with app. Please advise her to take Ranitidine 300mg  at bedtime as needed

## 2015-12-28 ENCOUNTER — Encounter: Payer: Self-pay | Admitting: Physician Assistant

## 2015-12-28 ENCOUNTER — Ambulatory Visit (INDEPENDENT_AMBULATORY_CARE_PROVIDER_SITE_OTHER): Payer: Medicare Other | Admitting: Physician Assistant

## 2015-12-28 VITALS — BP 118/70 | HR 64 | Ht 68.0 in | Wt 187.2 lb

## 2015-12-28 DIAGNOSIS — K219 Gastro-esophageal reflux disease without esophagitis: Secondary | ICD-10-CM | POA: Diagnosis not present

## 2015-12-28 DIAGNOSIS — R059 Cough, unspecified: Secondary | ICD-10-CM

## 2015-12-28 DIAGNOSIS — R05 Cough: Secondary | ICD-10-CM

## 2015-12-28 MED ORDER — BENZONATATE 100 MG PO CAPS: 100.0000 mg | ORAL_CAPSULE | Freq: Three times a day (TID) | ORAL | Status: DC

## 2015-12-28 NOTE — Progress Notes (Signed)
Patient ID: [name], female   DOB: 04-09-1941, 75 y.o.   MRN: [id number]     History of Present Illness: [name] is a pleasant 75 year old female who was evaluated by Dr. Silverio Decamp  in December 2016. She has a history of chronic GERD. She has been using Prilosec 40 mg twice daily. Despite this she continues to have a persistent cough which has gotten much worse over the past year. She has seen pulmonary in the past states she is not sure if she had any improvement. She states she coughs more at night and it is a dry nonproductive cough. She feels as if she has a constant tickle in her throat. She states she is troubled with multiple environmental allergies and uses Zyrtec, Nasonex, and Singulair with some improvement but her symptoms never totally clear. She states she wakes up every night coughing. She denies dysphagia, or Adina fascia. She intermittently has a sore throat. She reports that she often has a stuffy runny nose. At her last visit she was sent for esophageal manometry which was a normal test. Patient states that a couple years ago she was given a trial of Tessalon Perles for her cough and it provided significant relief.   Past Medical History  Diagnosis Date  . GERD (gastroesophageal reflux disease)     subsequent Nissen Fundoplication  . Hyperlipidemia   . Depression   . Degenerative joint disease   . Osteoporosis   . Arthritis   . Glaucoma   . Sleep apnea   . Diverticulosis 2003  . Hx of adenomatous colonic polyps 07/02/02  . Chronic back pain   . Fibromyalgia   . Esophageal dysmotility   . Hiatal hernia 11/08/09  . Hemorrhoids   . Hypertension   . Hearing loss   . Rectal bleeding     from hemorrhoids.    . Anal or rectal pain     sometimes  . Nausea   . Thrombocytopenia ([place])   . Elevated total protein   . Hypercalcemia   . Hyperlipidemia     Past Surgical History  Procedure Laterality Date  . Nissen fundoplication  [id number]    with subsequent takedown in  2009  . Gastric resection  2009  . Carotid doppler  03/31/2007    Bilateral ICAs - no evidence of significant diameter reduction, dissectin, tortuosity, FMD, or any other vascular abnormality.  . Cardiac catheterization  03/14/1992    Normal cardiac cath. Normal LV function.  . Cardiovascular stress test  01/22/2011    No scintigraphic evidence of inducible ischemia.  . Transthoracic echocardiogram  12/21/2010    EF 60%, moderate LVH,   . Esophageal manometry N/A 11/14/2015    Procedure: ESOPHAGEAL MANOMETRY (EM);  Surgeon: Mauri Pole, MD;  Location: [facility];  Service: Endoscopy;  Laterality: N/A;   Family History  Problem Relation Age of Onset  . Heart disease Mother   . Hypertension Mother   . Diabetes Mother   . Diabetes Father   . Kidney disease Father   . Breast cancer Daughter   . Colon cancer Neg Hx   . Stomach cancer Maternal Aunt   . Leukemia Maternal Uncle   . Prostate cancer Maternal Uncle   . Stroke Brother   . Cancer Maternal Grandmother   . Kidney disease Maternal Grandfather   . Uterine cancer Maternal Aunt    Social History  Substance Use Topics  . Smoking status: Never Smoker   . Smokeless tobacco: Never  Used  . Alcohol Use: No   Current Outpatient Prescriptions  Medication Sig Dispense Refill  . acetaminophen (TYLENOL) 325 MG tablet Take 650 mg by mouth as needed.    . bimatoprost (LUMIGAN) 0.03 % ophthalmic solution Place 1 drop into both eyes at bedtime.    . Cholecalciferol (VITAMIN D) 2000 UNITS CAPS Take 6,000 Units by mouth daily.    [name] FLUoxetine (PROZAC) 20 MG tablet Take 20 mg by mouth daily.    [name] losartan (COZAAR) 50 MG tablet Take 25 mg by mouth daily.    . montelukast (SINGULAIR) 10 MG tablet Take 10 mg by mouth at bedtime.    [name] omeprazole (PRILOSEC) 40 MG capsule Take 40 mg by mouth daily as needed (heartburn).    . rosuvastatin (CRESTOR) 20 MG tablet Take 20 mg by mouth daily.    . timolol (BETIMOL) 0.5 % ophthalmic solution Place 1  drop into both eyes 2 (two) times daily.    [name] tiZANidine (ZANAFLEX) 4 MG tablet Take 4 mg by mouth as needed for muscle spasms.    . benzonatate (TESSALON PERLES) 100 MG capsule Take 1 capsule (100 mg total) by mouth 3 (three) times daily. 60 capsule 0   Current Facility-Administered Medications  Medication Dose Route Frequency Provider Last Rate Last Dose  . triamcinolone acetonide (KENALOG) 10 MG/ML injection 10 mg  10 mg Other Once Landis Martins, DPM       Allergies  Allergen Reactions  . Adalat [Nifedipine]   . [place] [Hydrocortisone]   . Hydrocodone Itching  . Meperidine Hcl     Demerol causes hallucinations  . Naproxen Nausea And Vomiting     Review of Systems: Per history of present illness otherwise negative.  LAB RESULTS: No results for input(s): WBC, HGB, HCT, PLT in the last 72 hours. BMET No results for input(s): NA, K, CL, CO2, GLUCOSE, BUN, CREATININE, CALCIUM in the last 72 hours. LFT No results for input(s): PROT, ALBUMIN, AST, ALT, ALKPHOS, BILITOT, BILIDIR, IBILI in the last 72 hours. PT/INR No results for input(s): LABPROT, INR in the last 72 hours. Hepatitis Panel No results for input(s): HEPBSAG, HCVAB, HEPAIGM, HEPBIGM in the last 72 hours. C-Diff   Studies: Esophageal manometry 11/14/2015: Normal   Physical Exam: BP 118/70 mmHg  Pulse 64  Ht 5\' 8"  (1.727 m)  Wt 187 lb 3.2 oz (84.913 kg)  BMI 28.47 kg/m2 General: Pleasant, well developed , female in no acute distress Head: Normocephalic and atraumatic Eyes:  sclerae anicteric, conjunctiva pink  Ears: Normal auditory acuity Lungs: Clear throughout to auscultation Heart: Regular rate and rhythm Abdomen: Soft, non distended, non-tender. No masses, no hepatomegaly. Normal bowel sounds Musculoskeletal: Symmetrical with no gross deformities  Extremities: No edema  Neurological: Alert oriented x 4, grossly nonfocal Psychological:  Alert and cooperative. Normal mood and affect  Assessment and  Recommendations: 75 year old female with a history of chronic GERD status post Nissen fundoplication in [id number], field in 2003, status post take down in 2009, here with persistent symptoms of cough. She has been using a PPI with relief of her heartburn. Recent esophageal manometry with 24-hour pH impedance was normal. Patient complains of throat irritation, sinus congestion, runny nose, and nonproductive cough.? If she may have an allergic component to her symptoms. We have discussed reevaluation by pulmonary versus evaluation at the [facility]. Patient prefers reevaluation by pulmonary and possibly allergy evaluation. She will follow up here in 3-4 weeks after evaluation by pulmonary. In the meantime, she  will be given a trial of Tessalon Perles 100 mg 3 times a day when necessary cough.        Nathalya Wolanski, Deloris Ping 12/28/2015,

## 2015-12-28 NOTE — Patient Instructions (Signed)
We have sent the following medications to your pharmacy for you to pick up at your convenience: Tessalon Perles 100 mg three times a day as needed for cough.   Continue Prilosec 40 mg daily

## 2015-12-29 NOTE — Progress Notes (Signed)
Reviewed and agree with documentation and assessment and plan. K. Veena Daly Whipkey , MD   

## 2015-12-30 ENCOUNTER — Ambulatory Visit (INDEPENDENT_AMBULATORY_CARE_PROVIDER_SITE_OTHER): Payer: Medicare Other | Admitting: Pulmonary Disease

## 2015-12-30 ENCOUNTER — Encounter: Payer: Self-pay | Admitting: Pulmonary Disease

## 2015-12-30 VITALS — BP 120/74 | HR 61 | Ht 69.5 in | Wt 186.0 lb

## 2015-12-30 DIAGNOSIS — G4733 Obstructive sleep apnea (adult) (pediatric): Secondary | ICD-10-CM | POA: Diagnosis not present

## 2015-12-30 DIAGNOSIS — R05 Cough: Secondary | ICD-10-CM | POA: Diagnosis not present

## 2015-12-30 DIAGNOSIS — R059 Cough, unspecified: Secondary | ICD-10-CM

## 2015-12-30 MED ORDER — ALBUTEROL SULFATE HFA 108 (90 BASE) MCG/ACT IN AERS: 2.0000 | INHALATION_SPRAY | Freq: Four times a day (QID) | RESPIRATORY_TRACT | Status: DC | PRN

## 2015-12-30 NOTE — Patient Instructions (Signed)
We will schedule you for chest x-ray and pulmonary function tests. Start  albuterol rescue inhaler. Continue the Prilosec and antiallergy medication. Continue using the Flonase on a regular basis. We'll add saline nasal spray. We will schedule you for a split-night sleep study. Follow up in 3 months.

## 2015-12-30 NOTE — Progress Notes (Signed)
Subjective:    Patient ID: [name], female    DOB: 01-Sep-1941, 75 y.o.   MRN: [id number]  HPI Consult for evaluation of cough.  [name] has PMH of hiatal hernia, GERD, reflux, fibromyalgia. She complaints of cough for the past 2 years. Non productive in nature, not associated with dyspnea, wheezing. The cough is worse at night and sometimes she wakes up coughing. She has not been tried on any inhales and tessalon perles helped with cough in past. She has significant issues with allergic rhinitis and post nasal drip. She on Singulair, Claritin and nasonex. She has  GERD and hiatal hernia s/p Nissens fundoplication with subsequent take down in 09. Esophageal manometry in Jan 17 was normal. She is on protonix 40 mg bid but cough persists.   She has H/O OSA with a positive study in 56. She was never on CPAP as a subsequent study was normal a per the pt. I don't have the second study to review. She still had daily symptoms of snoring, daytime sleepiness.  DATA: Barium swallow 08/21/08 Postop changes from a hiatal hernia repair. No evidence for obstruction or contrast extravasation.  Sleep study 10/13/98 Moderate OSA, RDI 20.  Esophageal manometry 11/14/15 Normal relaxation of EG junction, no significant esophageal peristaltic abnormality.  Social History: She is a never smoker, No alcohol or drug use  Family History: Father- Diabetes, Kidney disease. Mother- Diabetes, heart disease, hypertension.  Past Medical History  Diagnosis Date  . GERD (gastroesophageal reflux disease)     subsequent Nissen Fundoplication  . Hyperlipidemia   . Depression   . Degenerative joint disease   . Osteoporosis   . Arthritis   . Glaucoma   . Sleep apnea   . Diverticulosis 2003  . Hx of adenomatous colonic polyps 07/02/02  . Chronic back pain   . Fibromyalgia   . Esophageal dysmotility   . Hiatal hernia 11/08/09  . Hemorrhoids   . Hypertension   . Hearing loss   . Rectal bleeding    from hemorrhoids.    . Anal or rectal pain     sometimes  . Nausea   . Thrombocytopenia ([place])   . Elevated total protein   . Hypercalcemia   . Hyperlipidemia     Current outpatient prescriptions:  .  acetaminophen (TYLENOL) 325 MG tablet, Take 650 mg by mouth as needed., Disp: , Rfl:  .  benzonatate (TESSALON PERLES) 100 MG capsule, Take 1 capsule (100 mg total) by mouth 3 (three) times daily., Disp: 60 capsule, Rfl: 0 .  bimatoprost (LUMIGAN) 0.03 % ophthalmic solution, Place 1 drop into both eyes at bedtime., Disp: , Rfl:  .  Cholecalciferol (VITAMIN D) 2000 UNITS CAPS, Take 6,000 Units by mouth daily., Disp: , Rfl:  .  FLUoxetine (PROZAC) 20 MG tablet, Take 20 mg by mouth daily., Disp: , Rfl:  .  losartan (COZAAR) 50 MG tablet, Take 25 mg by mouth daily., Disp: , Rfl:  .  montelukast (SINGULAIR) 10 MG tablet, Take 10 mg by mouth at bedtime., Disp: , Rfl:  .  omeprazole (PRILOSEC) 40 MG capsule, Take 40 mg by mouth daily as needed (heartburn)., Disp: , Rfl:  .  rosuvastatin (CRESTOR) 20 MG tablet, Take 20 mg by mouth daily., Disp: , Rfl:  .  timolol (BETIMOL) 0.5 % ophthalmic solution, Place 1 drop into both eyes 2 (two) times daily., Disp: , Rfl:  .  tiZANidine (ZANAFLEX) 4 MG tablet, Take 4 mg by mouth as needed  for muscle spasms., Disp: , Rfl:  .  albuterol (PROVENTIL HFA;VENTOLIN HFA) 108 (90 Base) MCG/ACT inhaler, Inhale 2 puffs into the lungs every 6 (six) hours as needed for wheezing or shortness of breath., Disp: 1 Inhaler, Rfl: 6  Current facility-administered medications:  .  triamcinolone acetonide (KENALOG) 10 MG/ML injection 10 mg, 10 mg, Other, Once, Landis Martins, DPM   Review of Systems Non productive cough. No sputum production, dyspnea, wheezing. No chest, palpitations No fevers, chills, loss of weight., appetite, malaise, fatigue No nausea, vomiting, diarrhea, constipation. All other review of systems are negative.     Objective:   Physical Exam Blood  pressure 120/74, pulse 61, height 5' 9.5" (1.765 m), weight 186 lb (84.369 kg), SpO2 98 %. Gen: No apparent distress Neuro: No gross focal deficits. Neck: No JVD, lymphadenopathy, thyromegaly. RS: Clear, No wheeze or crackles CVS: S1-S2 heard, no murmurs rubs gallops. Abdomen: Soft, positive bowel sounds. Extremities: No edema    Assessment & Plan:  Chronic cough  Likely secondary to upper airway syndrome from rhinitis, post nasal drip, hiatal hernia, GERD and acid reflux.This is less likely from asthma and reactive airway disease. She is already on maximal acid suppression. Her Nissens was taken down in 09 for unclear reasons and she may not be a candidate for a repeat procedure. She is getting adequately treated for rhinitis and post nasal drip. Evaluating and treating her for OSA may improve the upper airway irritation and cough.  She will be referred for a sleep study. I will get CXR, PFTs and trial her on albuterol rescue inhaler. She will continue the current treatment for GERD and post nasal drip. Will add saline nasal spray. She will return to clinic in 3 months.  I have educated her on behavior modification including avoidance of throat clearing, trying not to cough even when she gets the urge, using lozenges and limit talking.   Plan: - Continue tessalon perles, PPI, anti allergy treatment. Add saline nasal spray. - Sleep study - CXR, PFTs, albuterol PRN.  Return in 3 months.  Marshell Garfinkel MD [facility] Pager [phone] If no answer or after 3pm call: [phone] 01/02/2016, 1:03 PM

## 2016-01-11 DIAGNOSIS — K219 Gastro-esophageal reflux disease without esophagitis: Secondary | ICD-10-CM | POA: Diagnosis not present

## 2016-01-11 DIAGNOSIS — R05 Cough: Secondary | ICD-10-CM | POA: Diagnosis not present

## 2016-01-11 DIAGNOSIS — M170 Bilateral primary osteoarthritis of knee: Secondary | ICD-10-CM | POA: Diagnosis not present

## 2016-01-11 DIAGNOSIS — I10 Essential (primary) hypertension: Secondary | ICD-10-CM | POA: Diagnosis not present

## 2016-01-16 ENCOUNTER — Telehealth: Payer: Self-pay | Admitting: *Deleted

## 2016-01-16 NOTE — Telephone Encounter (Signed)
Pt states she is in extreme pain.  Dr. Cannon Kettle requested pt make an appt 6 weeks after her previous appt.  I left message instructing pt to make an appt and to continue her current therapies until seen in office.

## 2016-01-17 ENCOUNTER — Ambulatory Visit: Payer: Medicare Other | Admitting: Gastroenterology

## 2016-01-23 ENCOUNTER — Ambulatory Visit: Payer: PRIVATE HEALTH INSURANCE | Admitting: Sports Medicine

## 2016-02-01 DIAGNOSIS — H401133 Primary open-angle glaucoma, bilateral, severe stage: Secondary | ICD-10-CM | POA: Diagnosis not present

## 2016-02-07 ENCOUNTER — Ambulatory Visit: Payer: PRIVATE HEALTH INSURANCE | Admitting: Sports Medicine

## 2016-02-13 ENCOUNTER — Ambulatory Visit (HOSPITAL_BASED_OUTPATIENT_CLINIC_OR_DEPARTMENT_OTHER): Payer: Medicare Other | Attending: Pulmonary Disease | Admitting: Pulmonary Disease

## 2016-02-13 VITALS — Ht 70.0 in | Wt 190.0 lb

## 2016-02-13 DIAGNOSIS — G4736 Sleep related hypoventilation in conditions classified elsewhere: Secondary | ICD-10-CM | POA: Diagnosis not present

## 2016-02-13 DIAGNOSIS — G4737 Central sleep apnea in conditions classified elsewhere: Secondary | ICD-10-CM | POA: Insufficient documentation

## 2016-02-13 DIAGNOSIS — I10 Essential (primary) hypertension: Secondary | ICD-10-CM | POA: Insufficient documentation

## 2016-02-13 DIAGNOSIS — G4719 Other hypersomnia: Secondary | ICD-10-CM | POA: Diagnosis not present

## 2016-02-13 DIAGNOSIS — Z79899 Other long term (current) drug therapy: Secondary | ICD-10-CM | POA: Diagnosis not present

## 2016-02-13 DIAGNOSIS — G4733 Obstructive sleep apnea (adult) (pediatric): Secondary | ICD-10-CM | POA: Diagnosis not present

## 2016-02-13 DIAGNOSIS — R0683 Snoring: Secondary | ICD-10-CM | POA: Insufficient documentation

## 2016-02-28 ENCOUNTER — Encounter: Payer: Self-pay | Admitting: Sports Medicine

## 2016-02-28 ENCOUNTER — Ambulatory Visit (INDEPENDENT_AMBULATORY_CARE_PROVIDER_SITE_OTHER): Payer: Medicare Other | Admitting: Sports Medicine

## 2016-02-28 ENCOUNTER — Telehealth: Payer: Self-pay | Admitting: Pulmonary Disease

## 2016-02-28 ENCOUNTER — Telehealth: Payer: Self-pay | Admitting: *Deleted

## 2016-02-28 DIAGNOSIS — M2142 Flat foot [pes planus] (acquired), left foot: Secondary | ICD-10-CM

## 2016-02-28 DIAGNOSIS — G4733 Obstructive sleep apnea (adult) (pediatric): Secondary | ICD-10-CM

## 2016-02-28 DIAGNOSIS — M79671 Pain in right foot: Secondary | ICD-10-CM

## 2016-02-28 DIAGNOSIS — M722 Plantar fascial fibromatosis: Secondary | ICD-10-CM

## 2016-02-28 DIAGNOSIS — M2141 Flat foot [pes planus] (acquired), right foot: Secondary | ICD-10-CM

## 2016-02-28 DIAGNOSIS — M79672 Pain in left foot: Secondary | ICD-10-CM

## 2016-02-28 DIAGNOSIS — M7750 Other enthesopathy of unspecified foot: Secondary | ICD-10-CM

## 2016-02-28 DIAGNOSIS — M6588 Other synovitis and tenosynovitis, other site: Secondary | ICD-10-CM | POA: Diagnosis not present

## 2016-02-28 DIAGNOSIS — G4730 Sleep apnea, unspecified: Secondary | ICD-10-CM | POA: Diagnosis not present

## 2016-02-28 MED ORDER — IBUPROFEN 600 MG PO TABS: 600.0000 mg | ORAL_TABLET | Freq: Three times a day (TID) | ORAL | Status: DC | PRN

## 2016-02-28 MED ORDER — METHYLPREDNISOLONE 4 MG PO TBPK: ORAL_TABLET | ORAL | Status: DC

## 2016-02-28 NOTE — Progress Notes (Signed)
Patient ID: [name], female   DOB: Jul 01, 1941, 75 y.o.   MRN: [id number] Subjective: [name] is a 75 y.o. female patient who returns to office for evaluation of Right>left foot pain. Patient states that both feet hurt especially at right>left heel and at the in-step/ankle on the right>left with burning. Admits that she has not been stretching and has been skipping wearing the braces; reports that even with the braces both feet hurt. Admits that she got some Motrin from a friend at work that seems to help. Patient states that her feet hurt worse with her job. Patient denies any other pedal complaints.   Patient Active Problem List   Diagnosis Date Noted  . Cough   . Essential hypertension 11/24/2014  . Gonalgia 05/13/2013  . Cellulitis 05/13/2013  . Arthritis, degenerative 05/13/2013  . Degenerative joint disease involving multiple joints 01/19/2013  . Rheumatoid arthritis ([place]) 01/19/2013  . Thyroid nodule 06/11/2012  . Thrombocytopenia ([place])   . Elevated total protein   . Hypercalcemia   . Cyst of thyroid 10/29/2011  . Hemorrhoids, internal 05/28/2011  . Acid reflux 02/14/2011  . BP (high blood pressure) 02/14/2011  . Avitaminosis D 02/13/2011  . Glaucoma 11/28/2010  . HYPERLIPIDEMIA 12/14/2009  . HEMORRHOIDS 12/14/2009  . DIVERTICULOSIS, COLON 12/14/2009  . HIP PAIN, CHRONIC 12/14/2009  . BACK PAIN, CHRONIC 12/14/2009  . FIBROMYALGIA 12/14/2009  . OSTEOPOROSIS 12/14/2009  . SLEEP APNEA 12/14/2009  . COLONIC POLYPS, ADENOMATOUS, HX OF 12/14/2009  . GASTRIC POLYP, HX OF 12/14/2009  . HIATAL HERNIA 10/04/2009  . DEPRESSION 09/29/2009  . REFLUX ESOPHAGITIS 09/29/2009  . GERD 09/29/2009  . CONSTIPATION 09/29/2009  . DYSPHAGIA 09/29/2009   Current Outpatient Prescriptions on File Prior to Visit  Medication Sig Dispense Refill  . acetaminophen (TYLENOL) 325 MG tablet Take 650 mg by mouth as needed.    [name] albuterol (PROVENTIL HFA;VENTOLIN HFA) 108 (90 Base) MCG/ACT inhaler  Inhale 2 puffs into the lungs every 6 (six) hours as needed for wheezing or shortness of breath. 1 Inhaler 6  . benzonatate (TESSALON PERLES) 100 MG capsule Take 1 capsule (100 mg total) by mouth 3 (three) times daily. 60 capsule 0  . bimatoprost (LUMIGAN) 0.03 % ophthalmic solution Place 1 drop into both eyes at bedtime.    . Cholecalciferol (VITAMIN D) 2000 UNITS CAPS Take 6,000 Units by mouth daily.    [name] FLUoxetine (PROZAC) 20 MG tablet Take 20 mg by mouth daily.    [name] losartan (COZAAR) 50 MG tablet Take 25 mg by mouth daily.    . montelukast (SINGULAIR) 10 MG tablet Take 10 mg by mouth at bedtime.    [name] omeprazole (PRILOSEC) 40 MG capsule Take 40 mg by mouth daily as needed (heartburn).    . rosuvastatin (CRESTOR) 20 MG tablet Take 20 mg by mouth daily.    . timolol (BETIMOL) 0.5 % ophthalmic solution Place 1 drop into both eyes 2 (two) times daily.    [name] tiZANidine (ZANAFLEX) 4 MG tablet Take 4 mg by mouth as needed for muscle spasms.     Current Facility-Administered Medications on File Prior to Visit  Medication Dose Route Frequency Provider Last Rate Last Dose  . triamcinolone acetonide (KENALOG) 10 MG/ML injection 10 mg  10 mg Other Once Landis Martins, DPM       Allergies  Allergen Reactions  . Adalat [Nifedipine]   . Delacort [Hydrocortisone]   . Hydrocodone Itching  . Meperidine Hcl     Demerol causes hallucinations  .  Naproxen Nausea And Vomiting    Objective:  General: Alert and oriented x3 in no acute distress  Dermatology: No open lesions bilateral lower extremities, no webspace macerations, no ecchymosis bilateral, all nails x 10 are well manicured.  Vascular: Dorsalis Pedis 1/4 and Posterior Tibial pedal pulses 2/4, Capillary Fill Time 3 seconds,(+) pedal hair growth bilateral, no edema bilateral lower extremities, + hyperpigmentation and varicosities bilateral, Temperature gradient within normal limits.  Neurology: Johney Maine sensation intact via light touch bilateral.    Musculoskeletal: No tenderness with palpation at medial ankle gutter on right. There is moderate pain with palpation to posterior tibial tendon right>left, There is mild pain to palpation at insertion of plantar fascia on right>left, No pain with tuning fork to calcaneous on right,No pain with calf compression bilateral. There is decreased ankle rom with knee extending  vs flexed resembling gastroc equnius bilateral, Subtalar joint range of motion is within normal limits, there is no 1st ray hypermobility noted bilateral, mild bunion and decreased 1st MPJ rom Right>Left with functional limitus noted on weightbearing exam. Pes planus foot type. Strength within normal limits in all groups bilateral.   Assessment and Plan: Problem List Items Addressed This Visit    None    Visit Diagnoses    Plantar fasciitis, right    -  Primary    Right is worse    Relevant Medications    methylPREDNISolone (MEDROL DOSEPAK) 4 MG TBPK tablet    ibuprofen (ADVIL,MOTRIN) 600 MG tablet    Plantar fasciitis, left        Relevant Medications    methylPREDNISolone (MEDROL DOSEPAK) 4 MG TBPK tablet    ibuprofen (ADVIL,MOTRIN) 600 MG tablet    Tendonitis of foot        PT R>L    Relevant Medications    methylPREDNISolone (MEDROL DOSEPAK) 4 MG TBPK tablet    ibuprofen (ADVIL,MOTRIN) 600 MG tablet    Pes planus of both feet        Relevant Medications    methylPREDNISolone (MEDROL DOSEPAK) 4 MG TBPK tablet    ibuprofen (ADVIL,MOTRIN) 600 MG tablet    Bilateral foot pain        Relevant Medications    methylPREDNISolone (MEDROL DOSEPAK) 4 MG TBPK tablet    ibuprofen (ADVIL,MOTRIN) 600 MG tablet      -Complete examination performed -Discussed treatement options for fasciitis and structural pes planus and now PT tendonitis -No injection at this encounter; patient declined -Rx Medrol dose pack and Motrin to start once dose pack is completed -Cont with night splint to use daily -Cont with fascial braces  daily -Cont gentle stretching exercises as instructed -Recommend ice and good supportive shoes daily; Recommend to ice during break at work and change shoes as needed -Recommend OTC inserts -Ordered MRIs to further eval plantar fascia and PT tendon bilateral -Work note: Modified duty, work every other weekend secondary to foot pain -Patient to return to office After MRI or sooner if condition worsens.  Landis Martins, DPM

## 2016-02-28 NOTE — Telephone Encounter (Signed)
lmtcb

## 2016-02-28 NOTE — Procedures (Signed)
Patient Name: [name], [name] Date: 02/13/2016 Gender: Female D.O.B: Jan 09, 1941 Age (years): 18 Referring Provider: Marshell Garfinkel Height (inches): 39 Interpreting Physician: Kara Mead MD, ABSM Weight (lbs): 190 RPSGT: Carolin Coy BMI: 27 MRN: [id number] Neck Size: 14.50   CLINICAL INFORMATION Sleep Study Type: NPSG Indication for sleep study: Excessive Daytime Sleepiness, Hypertension, OSA . She has H/O OSA with a positive study in 68. She was never on CPAP as a subsequent study was normal as per the pt.She still has daily symptoms of snoring, daytime sleepiness. Epworth Sleepiness Score: 11   SLEEP STUDY TECHNIQUE As per the AASM Manual for the Scoring of Sleep and Associated Events v2.3 (April 2016) with a hypopnea requiring 4% desaturations. The channels recorded and monitored were frontal, central and occipital EEG, electrooculogram (EOG), submentalis EMG (chin), nasal and oral airflow, thoracic and abdominal wall motion, anterior tibialis EMG, snore microphone, electrocardiogram, and pulse oximetry.   MEDICATIONS Patient's medications include: LUMIGAN, SINGULAIR, CRESTOR, BETIMOL. Medications self-administered by patient during sleep study : No sleep medicine administered.   SLEEP ARCHITECTURE The study was initiated at 10:52:24 PM and ended at 5:00:39 AM. Sleep onset time was 33.5 minutes and the sleep efficiency was 52.3%. The total sleep time was 192.5 minutes. Stage REM latency was N/A minutes. The patient spent 25.97% of the night in stage N1 sleep, 56.10% in stage N2 sleep, 17.92% in stage N3 and 0.00% in REM. Alpha intrusion was absent. Supine sleep was 73.25%.   RESPIRATORY PARAMETERS The overall apnea/hypopnea index (AHI) was 54.9 per hour. There were 93 total apneas, including 28 obstructive, 62 central and 3 mixed apneas. There were 83 hypopneas and 9 RERAs. The AHI during Stage REM sleep was N/A per hour. AHI while supine was 49.4 per hour. The mean  oxygen saturation was 93.61%. The minimum SpO2 during sleep was 78.00%. Soft snoring was noted during this study.   CARDIAC DATA The 2 lead EKG demonstrated sinus rhythm. The mean heart rate was 45.61 beats per minute. Other EKG findings include: None.   LEG MOVEMENT DATA The total PLMS were 0 with a resulting PLMS index of 0.00. Associated arousal with leg movement index was 0.0 .   IMPRESSIONS - Severe obstructive sleep apnea occurred during this study (AHI = 54.9/h). - Mild central sleep apnea occurred during this study (CAI = 19.3/h). - Moderate oxygen desaturation was noted during this study (Min O2 = 78.00%). - The patient snored with Soft snoring volume. - No cardiac abnormalities were noted during this study. - Clinically significant periodic limb movements did not occur during sleep. No significant associated arousals.   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 BiPAP or ASV titration may be required to eliminate central sleep apnea. AutoCPAP should not be used duet ot he presence of central apneas - Avoid alcohol, sedatives and other CNS depressants that may worsen sleep apnea and disrupt normal sleep architecture. - Sleep hygiene should be reviewed to assess factors that may improve sleep quality. - Weight management and regular exercise should be initiated or continued if appropriate.  Kara Mead MD. [place]. [facility]   02/28/2016

## 2016-02-28 NOTE — Telephone Encounter (Addendum)
-----   Message from Landis Martins, [place] sent at 02/28/2016  2:06 PM EDT ----- Regarding: MRI MRI w/o contrast Bilateral foot Eval Plantar fascia and posterior tibial tendon Thanks Dr. Cannon Kettle.  Orders to D. [facility] for [employer].

## 2016-02-29 DIAGNOSIS — H401133 Primary open-angle glaucoma, bilateral, severe stage: Secondary | ICD-10-CM | POA: Diagnosis not present

## 2016-02-29 NOTE — Telephone Encounter (Signed)
Study has been read- please get clearance from Dr. Vaughan Browner before conveying results to patient Sleep study showed severe OSA with AHI of 59/hour She needs CPAP/BiPAP titration study and follow-up visit with sleep doc

## 2016-02-29 NOTE — Telephone Encounter (Signed)
Spoke with pt, requesting results from sleep study at beginning of month.   RA please advise.  Thanks!

## 2016-02-29 NOTE — Telephone Encounter (Signed)
Yes. Please let her know the results and that we are ordering the CPAP/Bipap titration and follow up with a sleep doctor. Thanks

## 2016-02-29 NOTE — Telephone Encounter (Signed)
LMTCB

## 2016-02-29 NOTE — Telephone Encounter (Signed)
Will route message to PM for further recs.

## 2016-03-01 ENCOUNTER — Other Ambulatory Visit: Payer: Self-pay | Admitting: *Deleted

## 2016-03-01 DIAGNOSIS — G4733 Obstructive sleep apnea (adult) (pediatric): Secondary | ICD-10-CM

## 2016-03-01 NOTE — Telephone Encounter (Signed)
Spoke with pt. She is aware of sleep study results. Orders have been placed. Nothing further was needed at this time.

## 2016-03-01 NOTE — Addendum Note (Signed)
Addended by: Desmond Dike C on: 03/01/2016 09:31 AM   Modules accepted: Orders

## 2016-03-04 ENCOUNTER — Ambulatory Visit (HOSPITAL_BASED_OUTPATIENT_CLINIC_OR_DEPARTMENT_OTHER): Payer: Medicare Other | Attending: Pulmonary Disease | Admitting: Pulmonary Disease

## 2016-03-04 VITALS — Ht 70.0 in | Wt 190.0 lb

## 2016-03-04 DIAGNOSIS — G4733 Obstructive sleep apnea (adult) (pediatric): Secondary | ICD-10-CM | POA: Diagnosis not present

## 2016-03-04 DIAGNOSIS — G4730 Sleep apnea, unspecified: Secondary | ICD-10-CM

## 2016-03-07 ENCOUNTER — Ambulatory Visit
Admission: RE | Admit: 2016-03-07 | Discharge: 2016-03-07 | Disposition: A | Discharge status: Home / Self Care | Payer: Medicare Other | Source: Ambulatory Visit | Attending: Sports Medicine | Admitting: Sports Medicine

## 2016-03-07 DIAGNOSIS — M19071 Primary osteoarthritis, right ankle and foot: Secondary | ICD-10-CM | POA: Diagnosis not present

## 2016-03-07 DIAGNOSIS — M19072 Primary osteoarthritis, left ankle and foot: Secondary | ICD-10-CM | POA: Diagnosis not present

## 2016-03-07 DIAGNOSIS — M722 Plantar fascial fibromatosis: Secondary | ICD-10-CM

## 2016-03-07 DIAGNOSIS — M7750 Other enthesopathy of unspecified foot: Secondary | ICD-10-CM

## 2016-03-07 DIAGNOSIS — M79671 Pain in right foot: Secondary | ICD-10-CM

## 2016-03-07 DIAGNOSIS — M79672 Pain in left foot: Secondary | ICD-10-CM

## 2016-03-09 DIAGNOSIS — G4733 Obstructive sleep apnea (adult) (pediatric): Secondary | ICD-10-CM | POA: Diagnosis not present

## 2016-03-09 NOTE — Procedures (Signed)
Patient Name: [name], [place] Date: 03/04/2016 Gender: Female D.O.B: 1941-02-21 Age (years): 62 Referring Provider: Marshell Garfinkel Height (inches): 70 Interpreting Physician: Kara Mead MD, ABSM Weight (lbs): 190 RPSGT: Baxter Flattery BMI: 27 MRN: [id number] Neck Size: 14.50   CLINICAL INFORMATION The patient is referred for a CPAP titration to treat sleep apnea. Date of NPSG: 02/2016 showed severe OSA with AHI of 59/hour   SLEEP STUDY TECHNIQUE As per the AASM Manual for the Scoring of Sleep and Associated Events v2.3 (April 2016) with a hypopnea requiring 4% desaturations. The channels recorded and monitored were frontal, central and occipital EEG, electrooculogram (EOG), submentalis EMG (chin), nasal and oral airflow, thoracic and abdominal wall motion, anterior tibialis EMG, snore microphone, electrocardiogram, and pulse oximetry. Continuous positive airway pressure (CPAP) was initiated at the beginning of the study and titrated to treat sleep-disordered breathing.   MEDICATIONS Medications taken by the patient : LUMIGAN, SINGULAIR, CRESTOR, BETIMOL, TIMOLOL  Medications administered by patient during sleep study : No sleep medicine administered.   TECHNICIAN COMMENTS Comments added by technician: Patient had difficulty initiating sleep. Patient cough sometimes during the study, however titration was achieved  Comments added by scorer: N/A   RESPIRATORY PARAMETERS Optimal PAP Pressure (cm): 10 AHI at Optimal Pressure (/hr): 0.0 Overall Minimal O2 (%): 92.00 Supine % at Optimal Pressure (%): 0 Minimal O2 at Optimal Pressure (%): 94.0     SLEEP ARCHITECTURE The study was initiated at 10:12:11 PM and ended at 4:46:36 AM. Sleep onset time was 92.0 minutes and the sleep efficiency was 68.7%. The total sleep time was 271.0 minutes. The patient spent 2.03% of the night in stage N1 sleep, 45.02% in stage N2 sleep, 33.03% in stage N3 and 19.93% in REM.Stage REM latency was 72.5  minutes Wake after sleep onset was 31.4. Alpha intrusion was absent. Supine sleep was 0.00%.   CARDIAC DATA The 2 lead EKG demonstrated sinus rhythm. The mean heart rate was 49.60 beats per minute. Other EKG findings include: None.   LEG MOVEMENT DATA The total Periodic Limb Movements of Sleep (PLMS) were 2. The PLMS index was 0.44. A PLMS index of <15 is considered normal in adults.   IMPRESSIONS - The optimal PAP pressure was 10 cm of water. - Central sleep apnea was not noted during this titration (CAI = 0.0/h). - Significant oxygen desaturations were not observed during this titration (min O2 = 92.00%). - No snoring was audible during this study. - No cardiac abnormalities were observed during this study. - Clinically significant periodic limb movements were not noted during this study. Arousals associated with PLMs were rare.   DIAGNOSIS - Obstructive Sleep Apnea (327.23 [G47.33 ICD-10])   RECOMMENDATIONS - Trial of CPAP therapy on 10 cm H2O with a Medium size Fisher&Paykel Full Face Mask Simplus mask and heated humidification. - Avoid alcohol, sedatives and other CNS depressants that may worsen sleep apnea and disrupt normal sleep architecture. - Sleep hygiene should be reviewed to assess factors that may improve sleep quality. - Weight management and regular exercise should be initiated or continued. - Return to [facility] Center for re-evaluation after 4 weeks of therapy    Kara Mead MD. FCCP. [facility]    03/09/2016

## 2016-03-19 ENCOUNTER — Ambulatory Visit (INDEPENDENT_AMBULATORY_CARE_PROVIDER_SITE_OTHER): Payer: Medicare Other | Admitting: Gastroenterology

## 2016-03-19 ENCOUNTER — Encounter: Payer: Self-pay | Admitting: Gastroenterology

## 2016-03-19 VITALS — BP 110/70 | HR 78 | Ht 70.0 in | Wt 184.0 lb

## 2016-03-19 DIAGNOSIS — R05 Cough: Secondary | ICD-10-CM | POA: Diagnosis not present

## 2016-03-19 DIAGNOSIS — R053 Chronic cough: Secondary | ICD-10-CM

## 2016-03-19 DIAGNOSIS — K219 Gastro-esophageal reflux disease without esophagitis: Secondary | ICD-10-CM

## 2016-03-19 MED ORDER — DEXLANSOPRAZOLE 60 MG PO CPDR: 60.0000 mg | DELAYED_RELEASE_CAPSULE | Freq: Every day | ORAL | Status: DC

## 2016-03-19 MED ORDER — RANITIDINE HCL 300 MG PO TABS: 300.0000 mg | ORAL_TABLET | Freq: Every evening | ORAL | Status: DC | PRN

## 2016-03-19 NOTE — Patient Instructions (Signed)
We have sent the following medications to your pharmacy for you to pick up at your convenience:  Dexilant, Zantac

## 2016-03-19 NOTE — Progress Notes (Signed)
TAQUIRA DOWNS    [id number]    29-Nov-1940  Primary Care 59, MD  Referring Physician: Fanny Bien, MD [address] [place], [place] [address]  Chief complaint:  Chronic cough, GERD  HPI: 46 yr F with h/o hiatal hernia and chronic GERD status post Nissen fundoplication [id number] and revision in 2009 with recurrent hiatal hernia, reflux and persistent chronic cough is here for follow-up. Patient reports her cough is worse at bedtime and also she has multiple episodes of cough during the daytime which is affecting her lifestyle. She is following with pulmonary and had a sleep study which did show features of obstructive sleep apnea. He is currently on PPI twice a day before breakfast and bedtime. She works from 3 to 11 PM and always skips dinner every eats after she gets home. She has had multiple upper GI series which show moderate size hiatal hernia and evidence of reflux. Esophageal manometry showed normal esophageal motility with good bolus clearance. Per patient she had a pH study done prior to her first fundoplication. It is not clear why patient had a revision in 2009, surgical report not available in epic but per patient she had a lot of scar tissue and had difficulty with the revision and she couldn't have the complete Nissen fundoplication at the time. Denies any nausea, vomiting, abdominal pain, melena or bright red blood per rectum   Outpatient Encounter Prescriptions as of 03/19/2016  Medication Sig  . acetaminophen (TYLENOL) 325 MG tablet Take 650 mg by mouth as needed.  . benzonatate (TESSALON PERLES) 100 MG capsule Take 1 capsule (100 mg total) by mouth 3 (three) times daily.  . bimatoprost (LUMIGAN) 0.03 % ophthalmic solution Place 1 drop into both eyes at bedtime.  . Cholecalciferol (VITAMIN D) 2000 UNITS CAPS Take 6,000 Units by mouth daily.  [name] FLUoxetine (PROZAC) 20 MG tablet Take 20 mg by mouth daily.  [name] ibuprofen (ADVIL,MOTRIN) 600 MG  tablet Take 1 tablet (600 mg total) by mouth every 8 (eight) hours as needed.  [name] losartan (COZAAR) 50 MG tablet Take 25 mg by mouth daily.  . montelukast (SINGULAIR) 10 MG tablet Take 10 mg by mouth at bedtime.  [name] omeprazole (PRILOSEC) 40 MG capsule Take 40 mg by mouth 2 (two) times daily.   . rosuvastatin (CRESTOR) 20 MG tablet Take 20 mg by mouth daily.  . timolol (BETIMOL) 0.5 % ophthalmic solution Place 1 drop into both eyes 2 (two) times daily.  [name] tiZANidine (ZANAFLEX) 4 MG tablet Take 4 mg by mouth as needed for muscle spasms.  . [DISCONTINUED] albuterol (PROVENTIL HFA;VENTOLIN HFA) 108 (90 Base) MCG/ACT inhaler Inhale 2 puffs into the lungs every 6 (six) hours as needed for wheezing or shortness of breath.  . [DISCONTINUED] methylPREDNISolone (MEDROL DOSEPAK) 4 MG TBPK tablet Take first as instructed   Facility-Administered Encounter Medications as of 03/19/2016  Medication  . triamcinolone acetonide (KENALOG) 10 MG/ML injection 10 mg    Allergies as of 03/19/2016 - Review Complete 03/19/2016  Allergen Reaction Noted  . Adalat [nifedipine]  09/17/2013  . Delacort [hydrocortisone]  12/28/2015  . Hydrocodone Itching 09/18/2013  . Meperidine hcl    . Naproxen Nausea And Vomiting     Past Medical History  Diagnosis Date  . GERD (gastroesophageal reflux disease)     subsequent Nissen Fundoplication  . Hyperlipidemia   . Depression   . Degenerative joint disease   . Osteoporosis   .  Arthritis   . Glaucoma   . Sleep apnea   . Diverticulosis 2003  . Hx of adenomatous colonic polyps 07/02/02  . Chronic back pain   . Fibromyalgia   . Esophageal dysmotility   . Hiatal hernia 11/08/09  . Hemorrhoids   . Hypertension   . Hearing loss   . Rectal bleeding     from hemorrhoids.    . Anal or rectal pain     sometimes  . Nausea   . Thrombocytopenia ([place])   . Elevated total protein   . Hypercalcemia   . Hyperlipidemia     Past Surgical History  Procedure Laterality Date  .  Nissen fundoplication  [id number]    with subsequent takedown in 2009  . Gastric resection  2009  . Carotid doppler  03/31/2007    Bilateral ICAs - no evidence of significant diameter reduction, dissectin, tortuosity, FMD, or any other vascular abnormality.  . Cardiac catheterization  03/14/1992    Normal cardiac cath. Normal LV function.  . Cardiovascular stress test  01/22/2011    No scintigraphic evidence of inducible ischemia.  . Transthoracic echocardiogram  12/21/2010    EF 60%, moderate LVH,   . Esophageal manometry N/A 11/14/2015    Procedure: ESOPHAGEAL MANOMETRY (EM);  Surgeon: Mauri Pole, MD;  Location: [facility];  Service: Endoscopy;  Laterality: N/A;    Family History  Problem Relation Age of Onset  . Heart disease Mother   . Hypertension Mother   . Diabetes Mother   . Diabetes Father   . Kidney disease Father   . Breast cancer Daughter   . Colon cancer Neg Hx   . Stomach cancer Maternal Aunt   . Leukemia Maternal Uncle   . Prostate cancer Maternal Uncle   . Stroke Brother   . Cancer Maternal Grandmother   . Kidney disease Maternal Grandfather   . Uterine cancer Maternal Aunt     Social History   Social History  . Marital Status: Married    Spouse Name: N/A  . Number of Children: 8  . Years of Education: N/A   Occupational History  . retired     retired Therapist, art.    Social History Main Topics  . Smoking status: Never Smoker   . Smokeless tobacco: Never Used  . Alcohol Use: No  . Drug Use: No  . Sexual Activity: Not on file   Other Topics Concern  . Not on file   Social History Narrative   Husband, Evienne Hibbert is Next of Kin. Cell # [phone]      Review of systems: Review of Systems  Constitutional: Negative for fever and chills.  HENT: Negative.   Eyes: Negative for blurred vision.  Respiratory: Negative for cough, shortness of breath and wheezing.   Cardiovascular: Negative for chest pain and palpitations.  Gastrointestinal: as  per HPI Genitourinary: Negative for dysuria, urgency, frequency and hematuria.  Musculoskeletal: Negative for myalgias, back pain and joint pain.  Skin: Negative for itching and rash.  Neurological: Negative for dizziness, tremors, focal weakness, seizures and loss of consciousness.  Endo/Heme/Allergies: Negative for environmental allergies.  Psychiatric/Behavioral: Negative for depression, suicidal ideas and hallucinations.  All other systems reviewed and are negative.   Physical Exam: Filed Vitals:   03/19/16 1346  BP: 110/70  Pulse: 78   Gen:      No acute distress HEENT:  EOMI, sclera anicteric Neck:     No masses; no thyromegaly Lungs:    Clear to  auscultation bilaterally; normal respiratory effort CV:         Regular rate and rhythm; no murmurs Abd:      + bowel sounds; soft, non-tender; no palpable masses, no distension Ext:    No edema; adequate peripheral perfusion Skin:      Warm and dry; no rash Neuro: alert and oriented x 3 Psych: normal mood and affect  Data Reviewed: Upper GI series 2012 1. Stable moderate sized hiatal hernia. 2. Single episode of mild spontaneous gastroesophageal reflux with rapid clearing.  Upper GI series  2010 Again noted is a moderate sized hiatal hernia, stable since prior study. There is normal esophageal motility. Changes of prior fundoplication again seen.  No gastric ulceration or mass. Normal duodenal bulb and duodenal sweep.  Reflux is noted during the procedure  Assessment and Plan/Recommendations: 75 year old female with history of chronic GERD, hiatal herniaHere for follow-up visit with complaints of persistent cough We will switch PPI to Dexilant 60 mg daily (he has tried omeprazole, pantoprazole, Nexium, lansoprazole and probably also rabeprazole in the past with no significant change in her cough) she feels when she was taking Dexilant her symptoms were better but insurance did not cover it so she had to be switched  to a different PPI Zantac 300 mg at bedtime as needed Discussed lifestyle modification in detail with patient, to avoid meals 3 hours before bedtime Sleep with head end elevation Small frequent meals Patient is requesting referral to ENT, will send a referral for evaluation of chronic cough Return in 1 year or sooner if needed    K. Denzil Magnuson , MD [phone] Mon-Fri 8a-5p [phone] after 5p, weekends, holidays  CC: Fanny Bien, MD

## 2016-03-23 ENCOUNTER — Encounter: Payer: Self-pay | Admitting: Pulmonary Disease

## 2016-03-23 ENCOUNTER — Ambulatory Visit (INDEPENDENT_AMBULATORY_CARE_PROVIDER_SITE_OTHER): Payer: Medicare Other | Admitting: Pulmonary Disease

## 2016-03-23 VITALS — BP 128/74 | HR 63 | Ht 70.0 in | Wt 184.6 lb

## 2016-03-23 DIAGNOSIS — R05 Cough: Secondary | ICD-10-CM | POA: Diagnosis not present

## 2016-03-23 DIAGNOSIS — G4733 Obstructive sleep apnea (adult) (pediatric): Secondary | ICD-10-CM

## 2016-03-23 DIAGNOSIS — R059 Cough, unspecified: Secondary | ICD-10-CM

## 2016-03-23 MED ORDER — FLUTICASONE PROPIONATE 50 MCG/ACT NA SUSP: 2.0000 | Freq: Every day | NASAL | Status: DC

## 2016-03-23 NOTE — Addendum Note (Signed)
Addended by: Len Blalock on: 03/23/2016 09:40 AM   Modules accepted: Orders

## 2016-03-23 NOTE — Patient Instructions (Addendum)
We will order flonase spray and chlropheniramine 8 mg PO tid. Start CPAP at 10. PFTs and CXR  Return in 1 month

## 2016-03-23 NOTE — Progress Notes (Signed)
Subjective:    Patient ID: [name], female    DOB: Mar 01, 1941, 75 y.o.   MRN: [id number]  HPI Consult for evaluation of cough.  Mrs. Brasile has PMH of hiatal hernia, GERD, reflux, fibromyalgia. She complaints of cough for the past 2 years. Non productive in nature, not associated with dyspnea, wheezing. The cough is worse at night and sometimes she wakes up coughing. She has not been tried on any inhales and tessalon perles helped with cough in past. She has significant issues with allergic rhinitis and post nasal drip. She on Singulair, Claritin and nasonex. She has  GERD and hiatal hernia s/p Nissens fundoplication with subsequent take down in 09. Esophageal manometry in Jan 17 was normal. She is on protonix 40 mg bid but cough persists.   She has H/O OSA with a positive study in 6. She was never on CPAP as a subsequent study was normal a per the pt. I don't have the second study to review. She still had daily symptoms of snoring, daytime sleepiness.  DATA: Barium swallow 08/21/08 Postop changes from a hiatal hernia repair. No evidence for obstruction or contrast extravasation.  Sleep study 10/13/98 Moderate OSA, RDI 20.  Esophageal manometry 11/14/15 Normal relaxation of EG junction, no significant esophageal peristaltic abnormality.  Social History: She is a never smoker, No alcohol or drug use  Family History: Father- Diabetes, Kidney disease. Mother- Diabetes, heart disease, hypertension.  Past Medical History  Diagnosis Date  . GERD (gastroesophageal reflux disease)     subsequent Nissen Fundoplication  . Hyperlipidemia   . Depression   . Degenerative joint disease   . Osteoporosis   . Arthritis   . Glaucoma   . Sleep apnea   . Diverticulosis 2003  . Hx of adenomatous colonic polyps 07/02/02  . Chronic back pain   . Fibromyalgia   . Esophageal dysmotility   . Hiatal hernia 11/08/09  . Hemorrhoids   . Hypertension   . Hearing loss   . Rectal bleeding    from hemorrhoids.    . Anal or rectal pain     sometimes  . Nausea   . Thrombocytopenia ([place])   . Elevated total protein   . Hypercalcemia   . Hyperlipidemia     Current outpatient prescriptions:  .  acetaminophen (TYLENOL) 325 MG tablet, Take 650 mg by mouth as needed., Disp: , Rfl:  .  benzonatate (TESSALON PERLES) 100 MG capsule, Take 1 capsule (100 mg total) by mouth 3 (three) times daily., Disp: 60 capsule, Rfl: 0 .  bimatoprost (LUMIGAN) 0.03 % ophthalmic solution, Place 1 drop into both eyes at bedtime., Disp: , Rfl:  .  Cholecalciferol (VITAMIN D) 2000 UNITS CAPS, Take 6,000 Units by mouth daily., Disp: , Rfl:  .  dexlansoprazole (DEXILANT) 60 MG capsule, Take 1 capsule (60 mg total) by mouth daily., Disp: 30 capsule, Rfl: 6 .  FLUoxetine (PROZAC) 20 MG tablet, Take 20 mg by mouth daily., Disp: , Rfl:  .  ibuprofen (ADVIL,MOTRIN) 600 MG tablet, Take 1 tablet (600 mg total) by mouth every 8 (eight) hours as needed., Disp: 30 tablet, Rfl: 0 .  losartan (COZAAR) 50 MG tablet, Take 25 mg by mouth daily., Disp: , Rfl:  .  montelukast (SINGULAIR) 10 MG tablet, Take 10 mg by mouth at bedtime., Disp: , Rfl:  .  omeprazole (PRILOSEC) 40 MG capsule, Take 40 mg by mouth 2 (two) times daily. , Disp: , Rfl:  .  ranitidine (ZANTAC) 300  MG tablet, Take 1 tablet (300 mg total) by mouth at bedtime as needed for heartburn., Disp: 30 tablet, Rfl: 6 .  rosuvastatin (CRESTOR) 20 MG tablet, Take 20 mg by mouth daily., Disp: , Rfl:  .  timolol (BETIMOL) 0.5 % ophthalmic solution, Place 1 drop into both eyes 2 (two) times daily., Disp: , Rfl:  .  tiZANidine (ZANAFLEX) 4 MG tablet, Take 4 mg by mouth as needed for muscle spasms., Disp: , Rfl:   Current facility-administered medications:  .  triamcinolone acetonide (KENALOG) 10 MG/ML injection 10 mg, 10 mg, Other, Once, Landis Martins, DPM   Review of Systems Non productive cough. No sputum production, dyspnea, wheezing. No chest, palpitations No  fevers, chills, loss of weight., appetite, malaise, fatigue No nausea, vomiting, diarrhea, constipation. All other review of systems are negative.     Objective:   Physical Exam Blood pressure 128/74, pulse 63, height 5\' 10"  (1.778 m), weight 184 lb 9.6 oz (83.734 kg), SpO2 99 %. Gen: No apparent distress Neuro: No gross focal deficits. Neck: No JVD, lymphadenopathy, thyromegaly. RS: Clear, No wheeze or crackles CVS: S1-S2 heard, no murmurs rubs gallops. Abdomen: Soft, positive bowel sounds. Extremities: No edema    Assessment & Plan:  #1 Chronic cough Likely secondary to upper airway syndrome from rhinitis, post nasal drip, hiatal hernia, GERD and acid reflux.This is less likely from asthma and reactive airway disease. She is already on maximal acid suppression and is being followed bu GI. Her Nissens was taken down in 09 for unclear reasons and she may not be a candidate for a repeat procedure.   I will try to suppress her rhinitis with first generation antihistamine and Flonase spray. Assess her lung with CXR and PFTs (these were ordered at last visit but not done yet).  I have educated her on behavior modification including avoidance of throat clearing, trying not to cough even when she gets the urge, using lozenges and limit talking.   #2 OSA Recent sleep study and titration study noted. Start CPAP at 10.   Plan: - Continue PPI. - Anti allergy treatment. Start chlorpheniramine 8 mg PO tid - Start Flonase nasal spray - PFTs, CXR - Order CPAP at 10 cm.   Return in 1 month.  Marshell Garfinkel MD [facility] Pager [phone] If no answer or after 3pm call: [phone] 03/23/2016, 9:24 AM

## 2016-03-27 ENCOUNTER — Encounter: Payer: Self-pay | Admitting: Sports Medicine

## 2016-03-27 ENCOUNTER — Ambulatory Visit (INDEPENDENT_AMBULATORY_CARE_PROVIDER_SITE_OTHER): Payer: Medicare Other | Admitting: Sports Medicine

## 2016-03-27 DIAGNOSIS — M722 Plantar fascial fibromatosis: Secondary | ICD-10-CM

## 2016-03-27 DIAGNOSIS — M79672 Pain in left foot: Secondary | ICD-10-CM

## 2016-03-27 DIAGNOSIS — M6588 Other synovitis and tenosynovitis, other site: Secondary | ICD-10-CM | POA: Diagnosis not present

## 2016-03-27 DIAGNOSIS — G2581 Restless legs syndrome: Secondary | ICD-10-CM

## 2016-03-27 DIAGNOSIS — M19079 Primary osteoarthritis, unspecified ankle and foot: Secondary | ICD-10-CM

## 2016-03-27 DIAGNOSIS — M79671 Pain in right foot: Secondary | ICD-10-CM | POA: Diagnosis not present

## 2016-03-27 DIAGNOSIS — M7750 Other enthesopathy of unspecified foot: Secondary | ICD-10-CM

## 2016-03-27 MED ORDER — ROPINIROLE HCL 0.25 MG PO TABS: 0.2500 mg | ORAL_TABLET | Freq: Every day | ORAL | Status: DC

## 2016-03-27 NOTE — Progress Notes (Signed)
Patient ID: [name], female   DOB: 1940-11-26, 75 y.o.   MRN: [id number]  Subjective: [name] is a 75 y.o. female patient who returns to office for review of MRI results and for follow-up evaluation of Right>left foot pain. Patient states that pain is about the same in both feet. States that her Motrin does help, however, she has exhibited caution because of her past history of GI bleed. Reports extreme pain in feet especially after work and with very sensitive varicose veins has had previous vascular intervention in the past. Patient denies any other pedal complaints.   Patient Active Problem List   Diagnosis Date Noted  . Cough   . Essential hypertension 11/24/2014  . Gonalgia 05/13/2013  . Cellulitis 05/13/2013  . Arthritis, degenerative 05/13/2013  . Degenerative joint disease involving multiple joints 01/19/2013  . Rheumatoid arthritis ([place]) 01/19/2013  . Thyroid nodule 06/11/2012  . Thrombocytopenia ([place])   . Elevated total protein   . Hypercalcemia   . Cyst of thyroid 10/29/2011  . Hemorrhoids, internal 05/28/2011  . Acid reflux 02/14/2011  . BP (high blood pressure) 02/14/2011  . Avitaminosis D 02/13/2011  . Glaucoma 11/28/2010  . HYPERLIPIDEMIA 12/14/2009  . HEMORRHOIDS 12/14/2009  . DIVERTICULOSIS, COLON 12/14/2009  . HIP PAIN, CHRONIC 12/14/2009  . BACK PAIN, CHRONIC 12/14/2009  . FIBROMYALGIA 12/14/2009  . OSTEOPOROSIS 12/14/2009  . SLEEP APNEA 12/14/2009  . COLONIC POLYPS, ADENOMATOUS, HX OF 12/14/2009  . GASTRIC POLYP, HX OF 12/14/2009  . HIATAL HERNIA 10/04/2009  . DEPRESSION 09/29/2009  . REFLUX ESOPHAGITIS 09/29/2009  . GERD 09/29/2009  . CONSTIPATION 09/29/2009  . DYSPHAGIA 09/29/2009   Current Outpatient Prescriptions on File Prior to Visit  Medication Sig Dispense Refill  . acetaminophen (TYLENOL) 325 MG tablet Take 650 mg by mouth as needed.    . benzonatate (TESSALON PERLES) 100 MG capsule Take 1 capsule (100 mg total) by mouth 3 (three) times  daily. 60 capsule 0  . bimatoprost (LUMIGAN) 0.03 % ophthalmic solution Place 1 drop into both eyes at bedtime.    . Cholecalciferol (VITAMIN D) 2000 UNITS CAPS Take 6,000 Units by mouth daily.    [name] dexlansoprazole (DEXILANT) 60 MG capsule Take 1 capsule (60 mg total) by mouth daily. 30 capsule 6  . FLUoxetine (PROZAC) 20 MG tablet Take 20 mg by mouth daily.    . fluticasone (FLONASE) 50 MCG/ACT nasal spray Place 2 sprays into both nostrils daily. 16 g 2  . ibuprofen (ADVIL,MOTRIN) 600 MG tablet Take 1 tablet (600 mg total) by mouth every 8 (eight) hours as needed. 30 tablet 0  . losartan (COZAAR) 50 MG tablet Take 25 mg by mouth daily.    . montelukast (SINGULAIR) 10 MG tablet Take 10 mg by mouth at bedtime.    [name] omeprazole (PRILOSEC) 40 MG capsule Take 40 mg by mouth 2 (two) times daily.     . ranitidine (ZANTAC) 300 MG tablet Take 1 tablet (300 mg total) by mouth at bedtime as needed for heartburn. 30 tablet 6  . rosuvastatin (CRESTOR) 20 MG tablet Take 20 mg by mouth daily.    . timolol (BETIMOL) 0.5 % ophthalmic solution Place 1 drop into both eyes 2 (two) times daily.    [name] tiZANidine (ZANAFLEX) 4 MG tablet Take 4 mg by mouth as needed for muscle spasms.     Current Facility-Administered Medications on File Prior to Visit  Medication Dose Route Frequency Provider Last Rate Last Dose  . triamcinolone acetonide (KENALOG) 10  MG/ML injection 10 mg  10 mg Other Once Landis Martins, DPM       Allergies  Allergen Reactions  . Adalat [Nifedipine]   . Delacort [Hydrocortisone]   . Hydrocodone Itching  . Meperidine Hcl     Demerol causes hallucinations  . Naproxen Nausea And Vomiting    Objective:  General: Alert and oriented x3 in no acute distress  Dermatology: No open lesions bilateral lower extremities, no webspace macerations, no ecchymosis bilateral, all nails x 10 are well manicured.  Vascular: Dorsalis Pedis 1/4 and Posterior Tibial pedal pulses 2/4, Capillary Fill Time 3  seconds,(+) pedal hair growth bilateral, no edema bilateral lower extremities, + hyperpigmentation and varicosities bilateral, Temperature gradient within normal limits.  Neurology: Gross sensation intact via light touch bilateral. Subjective restless leg syndrome symptoms.  Musculoskeletal: No tenderness with palpation at medial ankle gutter on right. There remains pain with palpation to posterior tibial tendon right>left, There is mild pain to palpation at insertion of plantar fascia on right>left, No pain with tuning fork to calcaneous on right,No pain with calf compression bilateral. There is decreased ankle rom with knee extending  vs flexed resembling gastroc equnius bilateral, midtarsal joint range of motion is limited bilateral, Subtalar joint range of motion is within normal limits, there is no 1st ray hypermobility noted bilateral, mild bunion and decreased 1st MPJ rom Right>Left with functional limitus noted on weightbearing exam. Pes planus foot type. Strength within normal limits in all groups bilateral.   MRI suggestive of arthritis and significant varicose veins. No other acute findings. Assessment and Plan: Problem List Items Addressed This Visit    None    Visit Diagnoses    Osteoarthritis of ankle and foot, unspecified laterality    -  Primary    Restless leg syndrome        Relevant Medications    rOPINIRole (REQUIP) 0.25 MG tablet    Plantar fasciitis, bilateral        Tendonitis of foot        Bilateral foot pain          -Complete examination performed -MRI results reviewed -Discussed treatement options for continued fasciitis and structural pes planus and PT tendonitis secondary to degenerative foot changes and severe varicosities -Prescribed Requip for subjective additional restless leg syndrome symptoms. Advised patient that we will try this medication first. No relief, then we'll consider tramadol; patient may call for this prescription, If no relief on  Requip. -Cont with night splint to use daily -Cont with fascial braces daily -Cont gentle stretching exercises as instructed -Recommend ice and good supportive shoes daily; Recommend to ice during break at work and change shoes as needed -Recommend OTC inserts -Recommend patient to use higher level of compression stockings during the day, especially when at work -Work note: Recommend continue with Modified duty, work every other weekend secondary to foot pain -Patient to return to office as needed or sooner if condition worsens.  Landis Martins, DPM

## 2016-03-29 ENCOUNTER — Telehealth: Payer: Self-pay

## 2016-03-29 NOTE — Telephone Encounter (Signed)
Left message that I had scheduled requested referral to [facility] ENT.  Requested patient call me back

## 2016-04-02 ENCOUNTER — Telehealth: Payer: Self-pay | Admitting: Gastroenterology

## 2016-04-03 NOTE — Telephone Encounter (Signed)
Called patient back to give her the ENT appointment time and she said that she was taking a new allergy medicine that seemed to be helping her cough.  She has decided to cancel the appointment.  She will call me back if she needs anything else.

## 2016-04-03 NOTE — Telephone Encounter (Signed)
Patient requested to cancel the appoin  tment.

## 2016-04-04 DIAGNOSIS — R1110 Vomiting, unspecified: Secondary | ICD-10-CM | POA: Diagnosis not present

## 2016-04-04 DIAGNOSIS — R197 Diarrhea, unspecified: Secondary | ICD-10-CM | POA: Diagnosis not present

## 2016-04-04 DIAGNOSIS — E860 Dehydration: Secondary | ICD-10-CM | POA: Diagnosis not present

## 2016-04-04 DIAGNOSIS — K219 Gastro-esophageal reflux disease without esophagitis: Secondary | ICD-10-CM | POA: Diagnosis not present

## 2016-04-05 ENCOUNTER — Encounter (HOSPITAL_BASED_OUTPATIENT_CLINIC_OR_DEPARTMENT_OTHER): Payer: Medicare Other

## 2016-04-19 ENCOUNTER — Ambulatory Visit (HOSPITAL_COMMUNITY)
Admission: RE | Admit: 2016-04-19 | Discharge: 2016-04-19 | Disposition: A | Discharge status: Home / Self Care | Payer: Medicare Other | Source: Ambulatory Visit | Attending: Pulmonary Disease | Admitting: Pulmonary Disease

## 2016-04-19 DIAGNOSIS — R059 Cough, unspecified: Secondary | ICD-10-CM

## 2016-04-19 DIAGNOSIS — R05 Cough: Secondary | ICD-10-CM | POA: Diagnosis present

## 2016-04-19 LAB — PULMONARY FUNCTION TEST
DL/VA % PRED: 78 %
DL/VA: 4.12 ml/min/mmHg/L
DLCO UNC: 17.36 ml/min/mmHg
DLCO unc % pred: 58 %
FEF 25-75 POST: 3.56 L/s
FEF 25-75 Pre: 3.09 L/sec
FEF2575-%Change-Post: 15 %
FEF2575-%PRED-POST: 192 %
FEF2575-%PRED-PRE: 167 %
FEV1-%Change-Post: 3 %
FEV1-%PRED-PRE: 109 %
FEV1-%Pred-Post: 113 %
FEV1-PRE: 2.32 L
FEV1-Post: 2.4 L
FEV1FVC-%CHANGE-POST: 2 %
FEV1FVC-%PRED-PRE: 111 %
FEV6-%CHANGE-POST: 1 %
FEV6-%Pred-Post: 104 %
FEV6-%Pred-Pre: 102 %
FEV6-Post: 2.73 L
FEV6-Pre: 2.7 L
FEV6FVC-%Pred-Post: 104 %
FEV6FVC-%Pred-Pre: 104 %
FVC-%Change-Post: 1 %
FVC-%PRED-POST: 100 %
FVC-%PRED-PRE: 99 %
FVC-POST: 2.73 L
FVC-PRE: 2.7 L
PRE FEV1/FVC RATIO: 86 %
Post FEV1/FVC ratio: 88 %
Post FEV6/FVC ratio: 100 %
Pre FEV6/FVC Ratio: 100 %
RV % pred: 77 %
RV: 1.9 L
TLC % PRED: 82 %
TLC: 4.69 L

## 2016-04-19 MED ORDER — ALBUTEROL SULFATE (2.5 MG/3ML) 0.083% IN NEBU
2.5000 mg | INHALATION_SOLUTION | Freq: Once | RESPIRATORY_TRACT | Status: AC
  Administered 2016-04-19: 2.5 mg via RESPIRATORY_TRACT

## 2016-04-26 ENCOUNTER — Other Ambulatory Visit: Payer: Self-pay | Admitting: Family Medicine

## 2016-04-26 DIAGNOSIS — R5381 Other malaise: Secondary | ICD-10-CM

## 2016-04-26 DIAGNOSIS — M8580 Other specified disorders of bone density and structure, unspecified site: Secondary | ICD-10-CM

## 2016-04-26 DIAGNOSIS — Z1231 Encounter for screening mammogram for malignant neoplasm of breast: Secondary | ICD-10-CM

## 2016-04-26 DIAGNOSIS — M170 Bilateral primary osteoarthritis of knee: Secondary | ICD-10-CM | POA: Diagnosis not present

## 2016-04-26 DIAGNOSIS — Z0000 Encounter for general adult medical examination without abnormal findings: Secondary | ICD-10-CM | POA: Diagnosis not present

## 2016-05-02 ENCOUNTER — Encounter: Payer: Self-pay | Admitting: Pulmonary Disease

## 2016-05-02 ENCOUNTER — Ambulatory Visit (INDEPENDENT_AMBULATORY_CARE_PROVIDER_SITE_OTHER): Payer: Medicare Other | Admitting: Pulmonary Disease

## 2016-05-02 VITALS — BP 126/74 | HR 54 | Ht 68.0 in | Wt 181.3 lb

## 2016-05-02 DIAGNOSIS — G4733 Obstructive sleep apnea (adult) (pediatric): Secondary | ICD-10-CM

## 2016-05-02 DIAGNOSIS — R05 Cough: Secondary | ICD-10-CM | POA: Diagnosis not present

## 2016-05-02 DIAGNOSIS — R059 Cough, unspecified: Secondary | ICD-10-CM

## 2016-05-02 MED ORDER — ALBUTEROL SULFATE 108 (90 BASE) MCG/ACT IN AEPB: 2.0000 | INHALATION_SPRAY | Freq: Four times a day (QID) | RESPIRATORY_TRACT | Status: DC | PRN

## 2016-05-02 MED ORDER — AZELASTINE-FLUTICASONE 137-50 MCG/ACT NA SUSP: 1.0000 | Freq: Two times a day (BID) | NASAL | Status: DC

## 2016-05-02 NOTE — Progress Notes (Signed)
Subjective:    Patient ID: [name], female    DOB: [birth year], 75 y.o.   MRN: [id number]  PROBLEM LIST Chronic cough OSA GERD  HPI [name] has PMH of hiatal hernia, GERD, reflux, fibromyalgia. She complaints of cough for the past 2 years. Non productive in nature, not associated with dyspnea, wheezing. The cough is worse at night and sometimes she wakes up coughing. She has not been tried on any inhales and tessalon perles helped with cough in past. She has significant issues with allergic rhinitis and post nasal drip. She on Singulair, Claritin and nasonex. She has  GERD and hiatal hernia s/p Nissens fundoplication with subsequent take down in 09. Esophageal manometry in Jan 17 was normal. She is on protonix 40 mg bid but cough persists.   She has H/O OSA with a positive study in 30. She was never on CPAP as a subsequent study was normal a per the pt. I don't have the second study to review. She still had daily symptoms of snoring, daytime sleepiness.  DATA: Barium swallow 08/21/08 Postop changes from a hiatal hernia repair. No evidence for obstruction or contrast extravasation.  Sleep study 10/13/98 Moderate OSA, RDI 20.  Esophageal manometry 11/14/15 Normal relaxation of EG junction, no significant esophageal peristaltic abnormality.  PFTs 04/19/16 FVC 2.70 (99%] FEV1 2.8 (809%) F/F 86 TLC 82% DLCO 58%. Moderate reduction in diffusion capacity that corrects for alveolar volume.  Social History: She is a never smoker, No alcohol or drug use  Family History: Father- Diabetes, Kidney disease. Mother- Diabetes, heart disease, hypertension.  Past Medical History  Diagnosis Date  . GERD (gastroesophageal reflux disease)     subsequent Nissen Fundoplication  . Hyperlipidemia   . Depression   . Degenerative joint disease   . Osteoporosis   . Arthritis   . Glaucoma   . Sleep apnea   . Diverticulosis 2003  . Hx of adenomatous colonic polyps 07/02/02  . Chronic back  pain   . Fibromyalgia   . Esophageal dysmotility   . Hiatal hernia 11/08/09  . Hemorrhoids   . Hypertension   . Hearing loss   . Rectal bleeding     from hemorrhoids.    . Anal or rectal pain     sometimes  . Nausea   . Thrombocytopenia ([place])   . Elevated total protein   . Hypercalcemia   . Hyperlipidemia     Current outpatient prescriptions:  .  acetaminophen (TYLENOL) 325 MG tablet, Take 650 mg by mouth as needed., Disp: , Rfl:  .  benzonatate (TESSALON PERLES) 100 MG capsule, Take 1 capsule (100 mg total) by mouth 3 (three) times daily., Disp: 60 capsule, Rfl: 0 .  bimatoprost (LUMIGAN) 0.03 % ophthalmic solution, Place 1 drop into both eyes at bedtime., Disp: , Rfl:  .  Cholecalciferol (VITAMIN D) 2000 UNITS CAPS, Take 6,000 Units by mouth daily., Disp: , Rfl:  .  FLUoxetine (PROZAC) 20 MG tablet, Take 20 mg by mouth daily., Disp: , Rfl:  .  fluticasone (FLONASE) 50 MCG/ACT nasal spray, Place 2 sprays into both nostrils daily., Disp: 16 g, Rfl: 2 .  ibuprofen (ADVIL,MOTRIN) 600 MG tablet, Take 1 tablet (600 mg total) by mouth every 8 (eight) hours as needed., Disp: 30 tablet, Rfl: 0 .  losartan (COZAAR) 50 MG tablet, Take 25 mg by mouth daily., Disp: , Rfl:  .  montelukast (SINGULAIR) 10 MG tablet, Take 10 mg by mouth at bedtime., Disp: , Rfl:  .  omeprazole (PRILOSEC) 40 MG capsule, Take 40 mg by mouth 2 (two) times daily. , Disp: , Rfl:  .  ranitidine (ZANTAC) 300 MG tablet, Take 1 tablet (300 mg total) by mouth at bedtime as needed for heartburn., Disp: 30 tablet, Rfl: 6 .  rOPINIRole (REQUIP) 0.25 MG tablet, Take 1 tablet (0.25 mg total) by mouth at bedtime., Disp: 60 tablet, Rfl: 2 .  rosuvastatin (CRESTOR) 20 MG tablet, Take 20 mg by mouth daily., Disp: , Rfl:  .  timolol (BETIMOL) 0.5 % ophthalmic solution, Place 1 drop into both eyes 2 (two) times daily., Disp: , Rfl:  .  tiZANidine (ZANAFLEX) 4 MG tablet, Take 4 mg by mouth as needed for muscle spasms., Disp: , Rfl:     Review of Systems Non productive cough. No sputum production, dyspnea, wheezing. No chest, palpitations No fevers, chills, loss of weight., appetite, malaise, fatigue No nausea, vomiting, diarrhea, constipation. All other review of systems are negative.     Objective:   Physical Exam Blood pressure 126/74, pulse 54, height 5\' 8"  (1.727 m), weight 181 lb 4.8 oz (82.237 kg), SpO2 99 %. Gen: No apparent distress Neuro: No gross focal deficits. Neck: No JVD, lymphadenopathy, thyromegaly. RS: Clear, No wheeze or crackles CVS: S1-S2 heard, no murmurs rubs gallops. Abdomen: Soft, positive bowel sounds. Extremities: No edema    Assessment & Plan:  #1 Chronic cough, resistant to multiple interventions Likely secondary to upper airway syndrome from rhinitis, post nasal drip, hiatal hernia, GERD and acid reflux. This is less likely from asthma and reactive airway disease on PFTs. I will try albuterol inhaler but don't have high hopes that this will help much. She had been on multiple prednisone tapers recently for her arthritis which did not help with cough. This makes asthma less likely.  She is already on maximal acid suppression and is being followed by GI. Her Nissens was taken down in 09 for unclear reasons and she may not be a candidate for a repeat procedure. She has not tolerated chlorpheniramine due to sleepiness. I will stop it and change flonase to dymista.  She is on Losatan which may on occasion cause cough. She will discuss with her PCP if she can be taken off it.   I have educated her on behavior modification including avoidance of throat clearing, trying not to cough even when she gets the urge, using lozenges and limit talking.   #2 OSA  Continue CPAP at 10.  She has excellent compliance and response to therapy  Plan: - Continue PPI. - Anti allergy treatment. Change flonase to dymista - Continue CPAP at 10 cm.   Return in 6 months  Marshell Garfinkel MD [facility] Pager [phone] If no answer or after 3pm call: [phone] 05/02/2016, 9:21 AM

## 2016-05-02 NOTE — Patient Instructions (Signed)
We will stop the chlorpheniramine as it is making you sleepy. I will change the flonase to dymista and give an albuterol inhaler to be used upto 4 times/day. Your cough may improve if you take yourself off the losartan Continue using the CPAP  Return in 6 months

## 2016-05-03 DIAGNOSIS — M6281 Muscle weakness (generalized): Secondary | ICD-10-CM | POA: Diagnosis not present

## 2016-05-03 DIAGNOSIS — F331 Major depressive disorder, recurrent, moderate: Secondary | ICD-10-CM | POA: Diagnosis not present

## 2016-05-03 DIAGNOSIS — R2689 Other abnormalities of gait and mobility: Secondary | ICD-10-CM | POA: Diagnosis not present

## 2016-05-03 DIAGNOSIS — I10 Essential (primary) hypertension: Secondary | ICD-10-CM | POA: Diagnosis not present

## 2016-05-03 DIAGNOSIS — M170 Bilateral primary osteoarthritis of knee: Secondary | ICD-10-CM | POA: Diagnosis not present

## 2016-05-08 DIAGNOSIS — R2689 Other abnormalities of gait and mobility: Secondary | ICD-10-CM | POA: Diagnosis not present

## 2016-05-08 DIAGNOSIS — I10 Essential (primary) hypertension: Secondary | ICD-10-CM | POA: Diagnosis not present

## 2016-05-08 DIAGNOSIS — M170 Bilateral primary osteoarthritis of knee: Secondary | ICD-10-CM | POA: Diagnosis not present

## 2016-05-08 DIAGNOSIS — M6281 Muscle weakness (generalized): Secondary | ICD-10-CM | POA: Diagnosis not present

## 2016-05-08 DIAGNOSIS — F331 Major depressive disorder, recurrent, moderate: Secondary | ICD-10-CM | POA: Diagnosis not present

## 2016-05-09 ENCOUNTER — Telehealth: Payer: Self-pay | Admitting: Gastroenterology

## 2016-05-09 ENCOUNTER — Telehealth: Payer: Self-pay | Admitting: Pulmonary Disease

## 2016-05-09 ENCOUNTER — Ambulatory Visit
Admission: RE | Admit: 2016-05-09 | Discharge: 2016-05-09 | Disposition: A | Discharge status: Home / Self Care | Payer: Medicare Other | Source: Ambulatory Visit | Attending: Family Medicine | Admitting: Family Medicine

## 2016-05-09 DIAGNOSIS — Z1231 Encounter for screening mammogram for malignant neoplasm of breast: Secondary | ICD-10-CM | POA: Diagnosis not present

## 2016-05-09 DIAGNOSIS — M8580 Other specified disorders of bone density and structure, unspecified site: Secondary | ICD-10-CM

## 2016-05-09 NOTE — Telephone Encounter (Signed)
FYI

## 2016-05-09 NOTE — Telephone Encounter (Signed)
Spoke with pt. States that [facility] called her yesterday and is needing a "report" from [place] about her CPAP usage. I will call [facility] and see what they need from [place]. Pt received a call from an ENT office about setting up an appointment and was not sure who referred. Advised her that it was not our office.  Called Melissa with [facility]. She states that they need the pt's last OV note. Melissa will pull this from Epic. Nothing further was needed.

## 2016-05-10 DIAGNOSIS — I10 Essential (primary) hypertension: Secondary | ICD-10-CM | POA: Diagnosis not present

## 2016-05-10 DIAGNOSIS — R2689 Other abnormalities of gait and mobility: Secondary | ICD-10-CM | POA: Diagnosis not present

## 2016-05-10 DIAGNOSIS — F331 Major depressive disorder, recurrent, moderate: Secondary | ICD-10-CM | POA: Diagnosis not present

## 2016-05-10 DIAGNOSIS — M170 Bilateral primary osteoarthritis of knee: Secondary | ICD-10-CM | POA: Diagnosis not present

## 2016-05-10 DIAGNOSIS — M6281 Muscle weakness (generalized): Secondary | ICD-10-CM | POA: Diagnosis not present

## 2016-05-14 ENCOUNTER — Telehealth: Payer: Self-pay | Admitting: Pulmonary Disease

## 2016-05-14 DIAGNOSIS — I10 Essential (primary) hypertension: Secondary | ICD-10-CM | POA: Diagnosis not present

## 2016-05-14 DIAGNOSIS — M170 Bilateral primary osteoarthritis of knee: Secondary | ICD-10-CM | POA: Diagnosis not present

## 2016-05-14 DIAGNOSIS — M6281 Muscle weakness (generalized): Secondary | ICD-10-CM | POA: Diagnosis not present

## 2016-05-14 DIAGNOSIS — R2689 Other abnormalities of gait and mobility: Secondary | ICD-10-CM | POA: Diagnosis not present

## 2016-05-14 DIAGNOSIS — F331 Major depressive disorder, recurrent, moderate: Secondary | ICD-10-CM | POA: Diagnosis not present

## 2016-05-14 NOTE — Telephone Encounter (Signed)
Spoke with pt  She states [facility] told her she needed appt for cpap compliance  She was just seen on 05/02/16  I spoke with Melissa with [facility] and she states that most likely, they just need her ov note  She will call back only if pt needs appt  Pt aware and will close encounter

## 2016-05-14 NOTE — Telephone Encounter (Signed)
[name] called back and stated that CPAP started on 04/04/16 and we saw her on 05/02/16 so this was 3 days early She needs f/u by 07/05/16  Spoke with the pt and scheduled appt for 06/15/16  Nothing further needed

## 2016-05-16 DIAGNOSIS — M170 Bilateral primary osteoarthritis of knee: Secondary | ICD-10-CM | POA: Diagnosis not present

## 2016-05-18 DIAGNOSIS — M170 Bilateral primary osteoarthritis of knee: Secondary | ICD-10-CM | POA: Diagnosis not present

## 2016-05-18 DIAGNOSIS — R2689 Other abnormalities of gait and mobility: Secondary | ICD-10-CM | POA: Diagnosis not present

## 2016-05-18 DIAGNOSIS — F331 Major depressive disorder, recurrent, moderate: Secondary | ICD-10-CM | POA: Diagnosis not present

## 2016-05-18 DIAGNOSIS — M6281 Muscle weakness (generalized): Secondary | ICD-10-CM | POA: Diagnosis not present

## 2016-05-18 DIAGNOSIS — I10 Essential (primary) hypertension: Secondary | ICD-10-CM | POA: Diagnosis not present

## 2016-05-21 DIAGNOSIS — M170 Bilateral primary osteoarthritis of knee: Secondary | ICD-10-CM | POA: Diagnosis not present

## 2016-05-21 DIAGNOSIS — F331 Major depressive disorder, recurrent, moderate: Secondary | ICD-10-CM | POA: Diagnosis not present

## 2016-05-21 DIAGNOSIS — I10 Essential (primary) hypertension: Secondary | ICD-10-CM | POA: Diagnosis not present

## 2016-05-21 DIAGNOSIS — R2689 Other abnormalities of gait and mobility: Secondary | ICD-10-CM | POA: Diagnosis not present

## 2016-05-21 DIAGNOSIS — M6281 Muscle weakness (generalized): Secondary | ICD-10-CM | POA: Diagnosis not present

## 2016-05-23 DIAGNOSIS — M170 Bilateral primary osteoarthritis of knee: Secondary | ICD-10-CM | POA: Diagnosis not present

## 2016-05-25 DIAGNOSIS — R2689 Other abnormalities of gait and mobility: Secondary | ICD-10-CM | POA: Diagnosis not present

## 2016-05-25 DIAGNOSIS — I10 Essential (primary) hypertension: Secondary | ICD-10-CM | POA: Diagnosis not present

## 2016-05-25 DIAGNOSIS — M6281 Muscle weakness (generalized): Secondary | ICD-10-CM | POA: Diagnosis not present

## 2016-05-25 DIAGNOSIS — F331 Major depressive disorder, recurrent, moderate: Secondary | ICD-10-CM | POA: Diagnosis not present

## 2016-05-25 DIAGNOSIS — M170 Bilateral primary osteoarthritis of knee: Secondary | ICD-10-CM | POA: Diagnosis not present

## 2016-05-28 DIAGNOSIS — R2689 Other abnormalities of gait and mobility: Secondary | ICD-10-CM | POA: Diagnosis not present

## 2016-05-28 DIAGNOSIS — M170 Bilateral primary osteoarthritis of knee: Secondary | ICD-10-CM | POA: Diagnosis not present

## 2016-05-28 DIAGNOSIS — I10 Essential (primary) hypertension: Secondary | ICD-10-CM | POA: Diagnosis not present

## 2016-05-28 DIAGNOSIS — M6281 Muscle weakness (generalized): Secondary | ICD-10-CM | POA: Diagnosis not present

## 2016-05-28 DIAGNOSIS — F331 Major depressive disorder, recurrent, moderate: Secondary | ICD-10-CM | POA: Diagnosis not present

## 2016-05-29 DIAGNOSIS — I10 Essential (primary) hypertension: Secondary | ICD-10-CM | POA: Diagnosis not present

## 2016-05-29 DIAGNOSIS — R05 Cough: Secondary | ICD-10-CM | POA: Diagnosis not present

## 2016-05-29 DIAGNOSIS — R51 Headache: Secondary | ICD-10-CM | POA: Diagnosis not present

## 2016-05-29 DIAGNOSIS — K219 Gastro-esophageal reflux disease without esophagitis: Secondary | ICD-10-CM | POA: Diagnosis not present

## 2016-05-30 DIAGNOSIS — R05 Cough: Secondary | ICD-10-CM | POA: Diagnosis not present

## 2016-05-30 DIAGNOSIS — M170 Bilateral primary osteoarthritis of knee: Secondary | ICD-10-CM | POA: Diagnosis not present

## 2016-05-31 DIAGNOSIS — F331 Major depressive disorder, recurrent, moderate: Secondary | ICD-10-CM | POA: Diagnosis not present

## 2016-05-31 DIAGNOSIS — M170 Bilateral primary osteoarthritis of knee: Secondary | ICD-10-CM | POA: Diagnosis not present

## 2016-05-31 DIAGNOSIS — I10 Essential (primary) hypertension: Secondary | ICD-10-CM | POA: Diagnosis not present

## 2016-05-31 DIAGNOSIS — M6281 Muscle weakness (generalized): Secondary | ICD-10-CM | POA: Diagnosis not present

## 2016-05-31 DIAGNOSIS — R2689 Other abnormalities of gait and mobility: Secondary | ICD-10-CM | POA: Diagnosis not present

## 2016-06-01 DIAGNOSIS — I10 Essential (primary) hypertension: Secondary | ICD-10-CM | POA: Diagnosis not present

## 2016-06-01 DIAGNOSIS — R51 Headache: Secondary | ICD-10-CM | POA: Diagnosis not present

## 2016-06-01 DIAGNOSIS — R05 Cough: Secondary | ICD-10-CM | POA: Diagnosis not present

## 2016-06-01 DIAGNOSIS — R42 Dizziness and giddiness: Secondary | ICD-10-CM | POA: Diagnosis not present

## 2016-06-06 DIAGNOSIS — M1711 Unilateral primary osteoarthritis, right knee: Secondary | ICD-10-CM | POA: Diagnosis not present

## 2016-06-06 DIAGNOSIS — R05 Cough: Secondary | ICD-10-CM | POA: Diagnosis not present

## 2016-06-06 DIAGNOSIS — J309 Allergic rhinitis, unspecified: Secondary | ICD-10-CM | POA: Diagnosis not present

## 2016-06-06 DIAGNOSIS — R51 Headache: Secondary | ICD-10-CM | POA: Diagnosis not present

## 2016-06-13 DIAGNOSIS — M170 Bilateral primary osteoarthritis of knee: Secondary | ICD-10-CM | POA: Diagnosis not present

## 2016-06-15 ENCOUNTER — Ambulatory Visit (INDEPENDENT_AMBULATORY_CARE_PROVIDER_SITE_OTHER): Payer: Medicare Other | Admitting: Pulmonary Disease

## 2016-06-15 ENCOUNTER — Encounter: Payer: Self-pay | Admitting: Pulmonary Disease

## 2016-06-15 VITALS — BP 130/72 | HR 57 | Ht 70.0 in | Wt 184.4 lb

## 2016-06-15 DIAGNOSIS — G4733 Obstructive sleep apnea (adult) (pediatric): Secondary | ICD-10-CM

## 2016-06-15 DIAGNOSIS — R05 Cough: Secondary | ICD-10-CM

## 2016-06-15 DIAGNOSIS — R059 Cough, unspecified: Secondary | ICD-10-CM

## 2016-06-15 NOTE — Progress Notes (Signed)
[name]    VN:[id number]    06-07-1941  Primary Care Physician:[name],ELIZABETH, MD  Referring Physician: Fanny Bien, MD [address] [place], [place] [address]  Chief complaint:   Follow up for Chronic cough OSA GERD  HPI: [name] has PMH of hiatal hernia, GERD, reflux, fibromyalgia. She complaints of cough for the past 2 years. Non productive in nature, not associated with dyspnea, wheezing. The cough is worse at night and sometimes she wakes up coughing. She has not been tried on any inhales and tessalon perles helped with cough in past. She has significant issues with allergic rhinitis and post nasal drip. She on Singulair, Claritin and nasonex. She has  GERD and hiatal hernia s/p Nissens fundoplication with subsequent take down in 09. Esophageal manometry in Jan 17 was normal. She is on protonix 40 mg bid but cough persists.   She has H/O OSA with a positive study in 66. She was never on CPAP as a subsequent study was normal a per the pt. I don't have the second study to review. She still had daily symptoms of snoring, daytime sleepiness.   Outpatient Encounter Prescriptions as of 06/15/2016  Medication Sig  . acetaminophen (TYLENOL) 325 MG tablet Take 650 mg by mouth as needed.  . Albuterol Sulfate (PROAIR RESPICLICK) 123XX123 (90 Base) MCG/ACT AEPB Inhale 2 puffs into the lungs every 6 (six) hours as needed.  . Azelastine-Fluticasone (DYMISTA) 137-50 MCG/ACT SUSP Place 1 spray into the nose 2 (two) times daily.  . benzonatate (TESSALON PERLES) 100 MG capsule Take 1 capsule (100 mg total) by mouth 3 (three) times daily.  . bimatoprost (LUMIGAN) 0.03 % ophthalmic solution Place 1 drop into both eyes at bedtime.  . Cholecalciferol (VITAMIN D) 2000 UNITS CAPS Take 6,000 Units by mouth daily.  [name] FLUoxetine (PROZAC) 20 MG tablet Take 20 mg by mouth daily.  [name] losartan (COZAAR) 50 MG tablet Take 25 mg by mouth daily.  . montelukast (SINGULAIR) 10 MG tablet Take 10  mg by mouth at bedtime.  [name] omeprazole (PRILOSEC) 40 MG capsule Take 40 mg by mouth 2 (two) times daily.   . ranitidine (ZANTAC) 300 MG tablet Take 1 tablet (300 mg total) by mouth at bedtime as needed for heartburn.  [name] rOPINIRole (REQUIP) 0.25 MG tablet Take 1 tablet (0.25 mg total) by mouth at bedtime.  . rosuvastatin (CRESTOR) 20 MG tablet Take 20 mg by mouth daily.  . timolol (BETIMOL) 0.5 % ophthalmic solution Place 1 drop into both eyes 2 (two) times daily.  [name] tiZANidine (ZANAFLEX) 4 MG tablet Take 4 mg by mouth as needed for muscle spasms.  . [DISCONTINUED] fluticasone (FLONASE) 50 MCG/ACT nasal spray Place 2 sprays into both nostrils daily. (Patient not taking: Reported on 06/15/2016)  . [DISCONTINUED] ibuprofen (ADVIL,MOTRIN) 600 MG tablet Take 1 tablet (600 mg total) by mouth every 8 (eight) hours as needed. (Patient not taking: Reported on 06/15/2016)   No facility-administered encounter medications on file as of 06/15/2016.     Allergies as of 06/15/2016 - Review Complete 06/15/2016  Allergen Reaction Noted  . Adalat [nifedipine]  09/17/2013  . Delacort [hydrocortisone]  12/28/2015  . Hydrocodone Itching 09/18/2013  . Meperidine hcl    . Naproxen Nausea And Vomiting     Past Medical History:  Diagnosis Date  . Anal or rectal pain    sometimes  . Arthritis   . Chronic back pain   .  Degenerative joint disease   . Depression   . Diverticulosis 2003  . Elevated total protein   . Esophageal dysmotility   . Fibromyalgia   . GERD (gastroesophageal reflux disease)    subsequent Nissen Fundoplication  . Glaucoma   . Hearing loss   . Hemorrhoids   . Hiatal hernia 11/08/09  . Hx of adenomatous colonic polyps 07/02/02  . Hypercalcemia   . Hyperlipidemia   . Hyperlipidemia   . Hypertension   . Nausea   . Osteoporosis   . Rectal bleeding    from hemorrhoids.    . Sleep apnea   . Thrombocytopenia ([place])     Past Surgical History:  Procedure Laterality Date  . CARDIAC  CATHETERIZATION  03/14/1992   Normal cardiac cath. Normal LV function.  [name] CARDIOVASCULAR STRESS TEST  01/22/2011   No scintigraphic evidence of inducible ischemia.  [name] CAROTID DOPPLER  03/31/2007   Bilateral ICAs - no evidence of significant diameter reduction, dissectin, tortuosity, FMD, or any other vascular abnormality.  . ESOPHAGEAL MANOMETRY N/A 11/14/2015   Procedure: ESOPHAGEAL MANOMETRY (EM);  Surgeon: Mauri Pole, MD;  Location: [facility];  Service: Endoscopy;  Laterality: N/A;  . GASTRIC RESECTION  2009  . NISSEN FUNDOPLICATION  [id number]   with subsequent takedown in 2009  . TRANSTHORACIC ECHOCARDIOGRAM  12/21/2010   EF 60%, moderate LVH,     Family History  Problem Relation Age of Onset  . Heart disease Mother   . Hypertension Mother   . Diabetes Mother   . Diabetes Father   . Kidney disease Father   . Breast cancer Daughter   . Colon cancer Neg Hx   . Stomach cancer Maternal Aunt   . Leukemia Maternal Uncle   . Prostate cancer Maternal Uncle   . Stroke Brother   . Cancer Maternal Grandmother   . Kidney disease Maternal Grandfather   . Uterine cancer Maternal Aunt     Social History   Social History  . Marital status: Married    Spouse name: N/A  . Number of children: 8  . Years of education: N/A   Occupational History  . retired     retired Therapist, art.    Social History Main Topics  . Smoking status: Never Smoker  . Smokeless tobacco: Never Used  . Alcohol use No  . Drug use: No  . Sexual activity: Not on file   Other Topics Concern  . Not on file   Social History Narrative   Husband, [name] is Next of Kin. Cell # [phone]     Review of systems: Review of Systems  Constitutional: Negative for fever and chills.  HENT: Negative.   Eyes: Negative for blurred vision.  Respiratory: as per HPI  Cardiovascular: Negative for chest pain and palpitations.  Gastrointestinal: Negative for vomiting, diarrhea, blood per  rectum. Genitourinary: Negative for dysuria, urgency, frequency and hematuria.  Musculoskeletal: Negative for myalgias, back pain and joint pain.  Skin: Negative for itching and rash.  Neurological: Negative for dizziness, tremors, focal weakness, seizures and loss of consciousness.  Endo/Heme/Allergies: Negative for environmental allergies.  Psychiatric/Behavioral: Negative for depression, suicidal ideas and hallucinations.  All other systems reviewed and are negative.   Physical Exam: Blood pressure 130/72, pulse (!) 57, height 5\' 10"  (1.778 m), weight 184 lb 6.4 oz (83.6 kg), SpO2 97 %. Gen:      No acute distress HEENT:  EOMI, sclera anicteric Neck:     No masses; no thyromegaly Lungs:  Clear to auscultation bilaterally; normal respiratory effort CV:         Regular rate and rhythm; no murmurs Abd:      + bowel sounds; soft, non-tender; no palpable masses, no distension Ext:    No edema; adequate peripheral perfusion Skin:      Warm and dry; no rash Neuro: alert and oriented x 3 Psych: normal mood and affect  Data Reviewed: Barium swallow 08/21/08 Postop changes from a hiatal hernia repair. No evidence for obstruction or contrast extravasation.  Sleep study 10/13/98 Moderate OSA, RDI 20.  Esophageal manometry 11/14/15 Normal relaxation of EG junction, no significant esophageal peristaltic abnormality.  CPAP compliance 05/16/16-06/14/16 100% usage for > 4 hrs AHI 3.8  PFTs 04/19/16 FVC 2.70 (99%] FEV1 2.8 (809%) F/F 86 TLC 82% DLCO 58%. Moderate reduction in diffusion capacity that corrects for alveolar volume  Assessment:  #1 Chronic cough, resistant to multiple interventions Likely secondary to upper airway syndrome from rhinitis, post nasal drip, hiatal hernia, GERD and acid reflux. This is less likely from asthma and reactive airway disease on PFTs. She had been on multiple prednisone tapers recently for her arthritis which did not help with cough. This makes  asthma less likely.  She is already on maximal acid suppression and is being followed by GI. Her Nissens was taken down in 09 for unclear reasons and she may not be a candidate for a repeat procedure. She has not tolerated chlorpheniramine due to sleepiness.   She is on Losatan which may on occasion cause cough. She had discussed this with her PCP and elected to continue with it as there are not may other options available to treat hypertension. Besides she was taken of losartan in past but this did not help with her cough.  I have educated her on behavior modification including avoidance of throat clearing, trying not to cough even when she gets the urge, using lozenges and limit talking.   #2 OSA  Continue CPAP at 10.  She has excellent compliance and response to therapy  Plan/Recommendations: - Continue PPI. - Continue CPAP at 10 cm   Marshell Garfinkel MD [facility] Pager [phone] 06/15/2016, 9:18 AM  CC: Fanny Bien, MD

## 2016-06-15 NOTE — Patient Instructions (Signed)
Please continue using his CPAP. Your compliance report was reviewed was reviewed today. You have excellent compliance and it appears to be working very well. Hydrate yourself at night to prevent dry mouth while using the CPAP.  Return to clinic in 6 months.

## 2016-06-20 DIAGNOSIS — M170 Bilateral primary osteoarthritis of knee: Secondary | ICD-10-CM | POA: Diagnosis not present

## 2016-06-20 DIAGNOSIS — K644 Residual hemorrhoidal skin tags: Secondary | ICD-10-CM | POA: Diagnosis not present

## 2016-06-27 DIAGNOSIS — M170 Bilateral primary osteoarthritis of knee: Secondary | ICD-10-CM | POA: Diagnosis not present

## 2016-07-04 DIAGNOSIS — M170 Bilateral primary osteoarthritis of knee: Secondary | ICD-10-CM | POA: Diagnosis not present

## 2016-07-04 DIAGNOSIS — H401133 Primary open-angle glaucoma, bilateral, severe stage: Secondary | ICD-10-CM | POA: Diagnosis not present

## 2016-07-11 DIAGNOSIS — M1712 Unilateral primary osteoarthritis, left knee: Secondary | ICD-10-CM | POA: Diagnosis not present

## 2016-07-11 DIAGNOSIS — R05 Cough: Secondary | ICD-10-CM | POA: Diagnosis not present

## 2016-07-11 DIAGNOSIS — M1711 Unilateral primary osteoarthritis, right knee: Secondary | ICD-10-CM | POA: Diagnosis not present

## 2016-07-11 DIAGNOSIS — J309 Allergic rhinitis, unspecified: Secondary | ICD-10-CM | POA: Diagnosis not present

## 2016-07-16 DIAGNOSIS — R001 Bradycardia, unspecified: Secondary | ICD-10-CM | POA: Diagnosis not present

## 2016-07-16 DIAGNOSIS — I10 Essential (primary) hypertension: Secondary | ICD-10-CM | POA: Diagnosis not present

## 2016-07-16 DIAGNOSIS — J329 Chronic sinusitis, unspecified: Secondary | ICD-10-CM | POA: Diagnosis not present

## 2016-07-16 DIAGNOSIS — J302 Other seasonal allergic rhinitis: Secondary | ICD-10-CM | POA: Diagnosis not present

## 2016-07-18 DIAGNOSIS — Z23 Encounter for immunization: Secondary | ICD-10-CM | POA: Diagnosis not present

## 2016-07-18 DIAGNOSIS — M170 Bilateral primary osteoarthritis of knee: Secondary | ICD-10-CM | POA: Diagnosis not present

## 2016-07-20 ENCOUNTER — Telehealth: Payer: Self-pay | Admitting: Cardiovascular Disease

## 2016-07-20 NOTE — Telephone Encounter (Signed)
Received notes from Dr. Rachell Cipro MD for visit with Dr. Gwenlyn Found 07/27/2016 10:00am records given to [facility] (medical records) [facility]

## 2016-07-27 ENCOUNTER — Ambulatory Visit (INDEPENDENT_AMBULATORY_CARE_PROVIDER_SITE_OTHER): Payer: Medicare Other | Admitting: Cardiovascular Disease

## 2016-07-27 ENCOUNTER — Encounter: Payer: Self-pay | Admitting: Cardiovascular Disease

## 2016-07-27 VITALS — BP 144/76 | HR 51 | Ht 71.0 in | Wt 186.0 lb

## 2016-07-27 DIAGNOSIS — E785 Hyperlipidemia, unspecified: Secondary | ICD-10-CM | POA: Diagnosis not present

## 2016-07-27 DIAGNOSIS — Z79899 Other long term (current) drug therapy: Secondary | ICD-10-CM

## 2016-07-27 DIAGNOSIS — I10 Essential (primary) hypertension: Secondary | ICD-10-CM | POA: Diagnosis not present

## 2016-07-27 DIAGNOSIS — R001 Bradycardia, unspecified: Secondary | ICD-10-CM | POA: Diagnosis not present

## 2016-07-27 NOTE — Assessment & Plan Note (Signed)
History of bradycardia   In the past by event monitoring. She has been symptomatic with HR in the 40s. She is on timalol eye drops which I've suggested that she discontine

## 2016-07-27 NOTE — Assessment & Plan Note (Signed)
History of hyperlipidemia on statin therapy. We will recheck a lipid and liver profile 

## 2016-07-27 NOTE — Assessment & Plan Note (Signed)
History of hypertension   With blood pressure measured at 144/76. She is on losartan. Continue current meds at current dosing

## 2016-07-27 NOTE — Patient Instructions (Signed)
Medication Instructions:  NO CHANGES.  Labwork: Your physician recommends that you return for lab work in: [employer]. You will need to be fasting.   Follow-Up: Your physician wants you to follow-up in: [place].  You will receive a reminder letter in the mail two months in advance. If you don't receive a letter, please call our office to schedule the follow-up appointment.    If you need a refill on your cardiac medications before your next appointment, please call your pharmacy.

## 2016-07-27 NOTE — Progress Notes (Signed)
07/27/2016 [name] [name]   10-Dec-1940  [id number]  Primary Physician [name],ELIZABETH, MD Primary Cardiologist: Lorretta Harp MD [name]  HPI:  The patient is a  75 year old, mildly overweight, married [place] American female, mother of 48, grandmother to 41 grandchildren, whom I last saw on  11/24/14. She has a history of hyperlipidemia. She was admitted to [facility] on December 20, 2010, with presyncope. Her symptoms were heralded by an episode of nausea. Her other history is remarkable for degenerative joint disease, depression, and fibromyalgia as well as esophageal dysmotility, status post Nissen fundoplication in the past. A 2D echocardiogram was normal. She was complaining of some chest pain at that time, and a Myoview stress test performed in our office on January 22, 2011, was normal as well.  An event monitor was performed  That showed heart rates in the 40s especially while she was asleep.[name] Recently she's been symptomatic from this. She is on Timalol eyeddrops. . Dr. Ernie Hew follows her blood work including lipid profile.   Current Outpatient Prescriptions  Medication Sig Dispense Refill  . acetaminophen (TYLENOL) 325 MG tablet Take 650 mg by mouth as needed.    . Albuterol Sulfate (PROAIR RESPICLICK) 123XX123 (90 Base) MCG/ACT AEPB Inhale 2 puffs into the lungs every 6 (six) hours as needed. 1 each 5  . Azelastine-Fluticasone (DYMISTA) 137-50 MCG/ACT SUSP Place 1 spray into the nose 2 (two) times daily. 23 g 5  . benzonatate (TESSALON PERLES) 100 MG capsule Take 1 capsule (100 mg total) by mouth 3 (three) times daily. 60 capsule 0  . bimatoprost (LUMIGAN) 0.03 % ophthalmic solution Place 1 drop into both eyes at bedtime.    . Cholecalciferol (VITAMIN D) 2000 UNITS CAPS Take 6,000 Units by mouth daily.    [name] FLUoxetine (PROZAC) 20 MG tablet Take 20 mg by mouth daily.    [name] losartan (COZAAR) 50 MG tablet Take 25 mg by mouth daily.    . montelukast (SINGULAIR) 10 MG tablet Take 10 mg by  mouth at bedtime.    [name] omeprazole (PRILOSEC) 40 MG capsule Take 40 mg by mouth 2 (two) times daily.     . ranitidine (ZANTAC) 300 MG tablet Take 1 tablet (300 mg total) by mouth at bedtime as needed for heartburn. 30 tablet 6  . rOPINIRole (REQUIP) 0.25 MG tablet Take 1 tablet (0.25 mg total) by mouth at bedtime. 60 tablet 2  . rosuvastatin (CRESTOR) 20 MG tablet Take 20 mg by mouth daily.    . timolol (BETIMOL) 0.5 % ophthalmic solution Place 1 drop into both eyes 2 (two) times daily.    [name] tiZANidine (ZANAFLEX) 4 MG tablet Take 4 mg by mouth as needed for muscle spasms.     No current facility-administered medications for this visit.     Allergies  Allergen Reactions  . Adalat [Nifedipine]   . Delacort [Hydrocortisone]   . Hydrocodone Itching  . Meperidine Hcl     Demerol causes hallucinations  . Naproxen Nausea And Vomiting    Social History   Social History  . Marital status: Married    Spouse name: N/A  . Number of children: 8  . Years of education: N/A   Occupational History  . retired     retired Therapist, art.    Social History Main Topics  . Smoking status: Never Smoker  . Smokeless tobacco: Never Used  . Alcohol use No  . Drug use: No  . Sexual activity: Not on  file   Other Topics Concern  . Not on file   Social History Narrative   Husband, [name] is Next of Kin. Cell # [phone]     Review of Systems: General: negative for chills, fever, night sweats or weight changes.  Cardiovascular: negative for chest pain, dyspnea on exertion, edema, orthopnea, palpitations, paroxysmal nocturnal dyspnea or shortness of breath Dermatological: negative for rash Respiratory: negative for cough or wheezing Urologic: negative for hematuria Abdominal: negative for nausea, vomiting, diarrhea, bright red blood per rectum, melena, or hematemesis Neurologic: negative for visual changes, syncope, or dizziness All other systems reviewed and are otherwise negative  except as noted above.    Blood pressure (!) 144/76, pulse (!) 51, height 5\' 11"  (1.803 m), weight 186 lb (84.4 kg).  General appearance: alert and no distress Neck: no adenopathy, no carotid bruit, no JVD, supple, symmetrical, trachea midline and thyroid not enlarged, symmetric, no tenderness/mass/nodules Lungs: clear to auscultation bilaterally Heart: regular rate and rhythm, S1, S2 normal, no murmur, click, rub or gallop Extremities: extremities normal, atraumatic, no cyanosis or edema  EKG sinus bradycardia 51 without ST or T-wave changes. I personally reviewed this EKG.  ASSESSMENT AND PLAN:   HYPERLIPIDEMIA History of hyperlipidemia on statin therapy. We will recheck a lipid and liver profile  Essential hypertension  History of hypertension   With blood pressure measured at 144/76. She is on losartan. Continue current meds at current dosing  Bradycardia  History of bradycardia   In the past by event monitoring. She has been symptomatic with HR in the 40s. She is on timalol eye drops which I've suggested that she discontine      Lorretta Harp MD [facility], [facility] 07/27/2016 10:24 AM

## 2016-08-24 DIAGNOSIS — R05 Cough: Secondary | ICD-10-CM | POA: Diagnosis not present

## 2016-08-24 DIAGNOSIS — R001 Bradycardia, unspecified: Secondary | ICD-10-CM | POA: Diagnosis not present

## 2016-08-24 DIAGNOSIS — T148XXA Other injury of unspecified body region, initial encounter: Secondary | ICD-10-CM | POA: Diagnosis not present

## 2016-08-24 DIAGNOSIS — Z6828 Body mass index (BMI) 28.0-28.9, adult: Secondary | ICD-10-CM | POA: Diagnosis not present

## 2016-08-29 DIAGNOSIS — R001 Bradycardia, unspecified: Secondary | ICD-10-CM | POA: Diagnosis not present

## 2016-08-29 DIAGNOSIS — M1711 Unilateral primary osteoarthritis, right knee: Secondary | ICD-10-CM | POA: Diagnosis not present

## 2016-08-29 DIAGNOSIS — E782 Mixed hyperlipidemia: Secondary | ICD-10-CM | POA: Diagnosis not present

## 2016-08-29 DIAGNOSIS — M1712 Unilateral primary osteoarthritis, left knee: Secondary | ICD-10-CM | POA: Diagnosis not present

## 2016-08-29 DIAGNOSIS — Z23 Encounter for immunization: Secondary | ICD-10-CM | POA: Diagnosis not present

## 2016-09-26 DIAGNOSIS — M170 Bilateral primary osteoarthritis of knee: Secondary | ICD-10-CM | POA: Diagnosis not present

## 2016-09-26 DIAGNOSIS — M1711 Unilateral primary osteoarthritis, right knee: Secondary | ICD-10-CM | POA: Diagnosis not present

## 2016-09-26 DIAGNOSIS — M1712 Unilateral primary osteoarthritis, left knee: Secondary | ICD-10-CM | POA: Diagnosis not present

## 2016-10-15 ENCOUNTER — Other Ambulatory Visit: Payer: Self-pay | Admitting: Gastroenterology

## 2016-10-16 IMAGING — CR DG CHEST 2V
2 series · 2 of 2 positions shown · non-contrast
Comparison: 07/12/2014

CLINICAL DATA: Chest pain.

EXAM:
CHEST  2 VIEW

[chest pa]
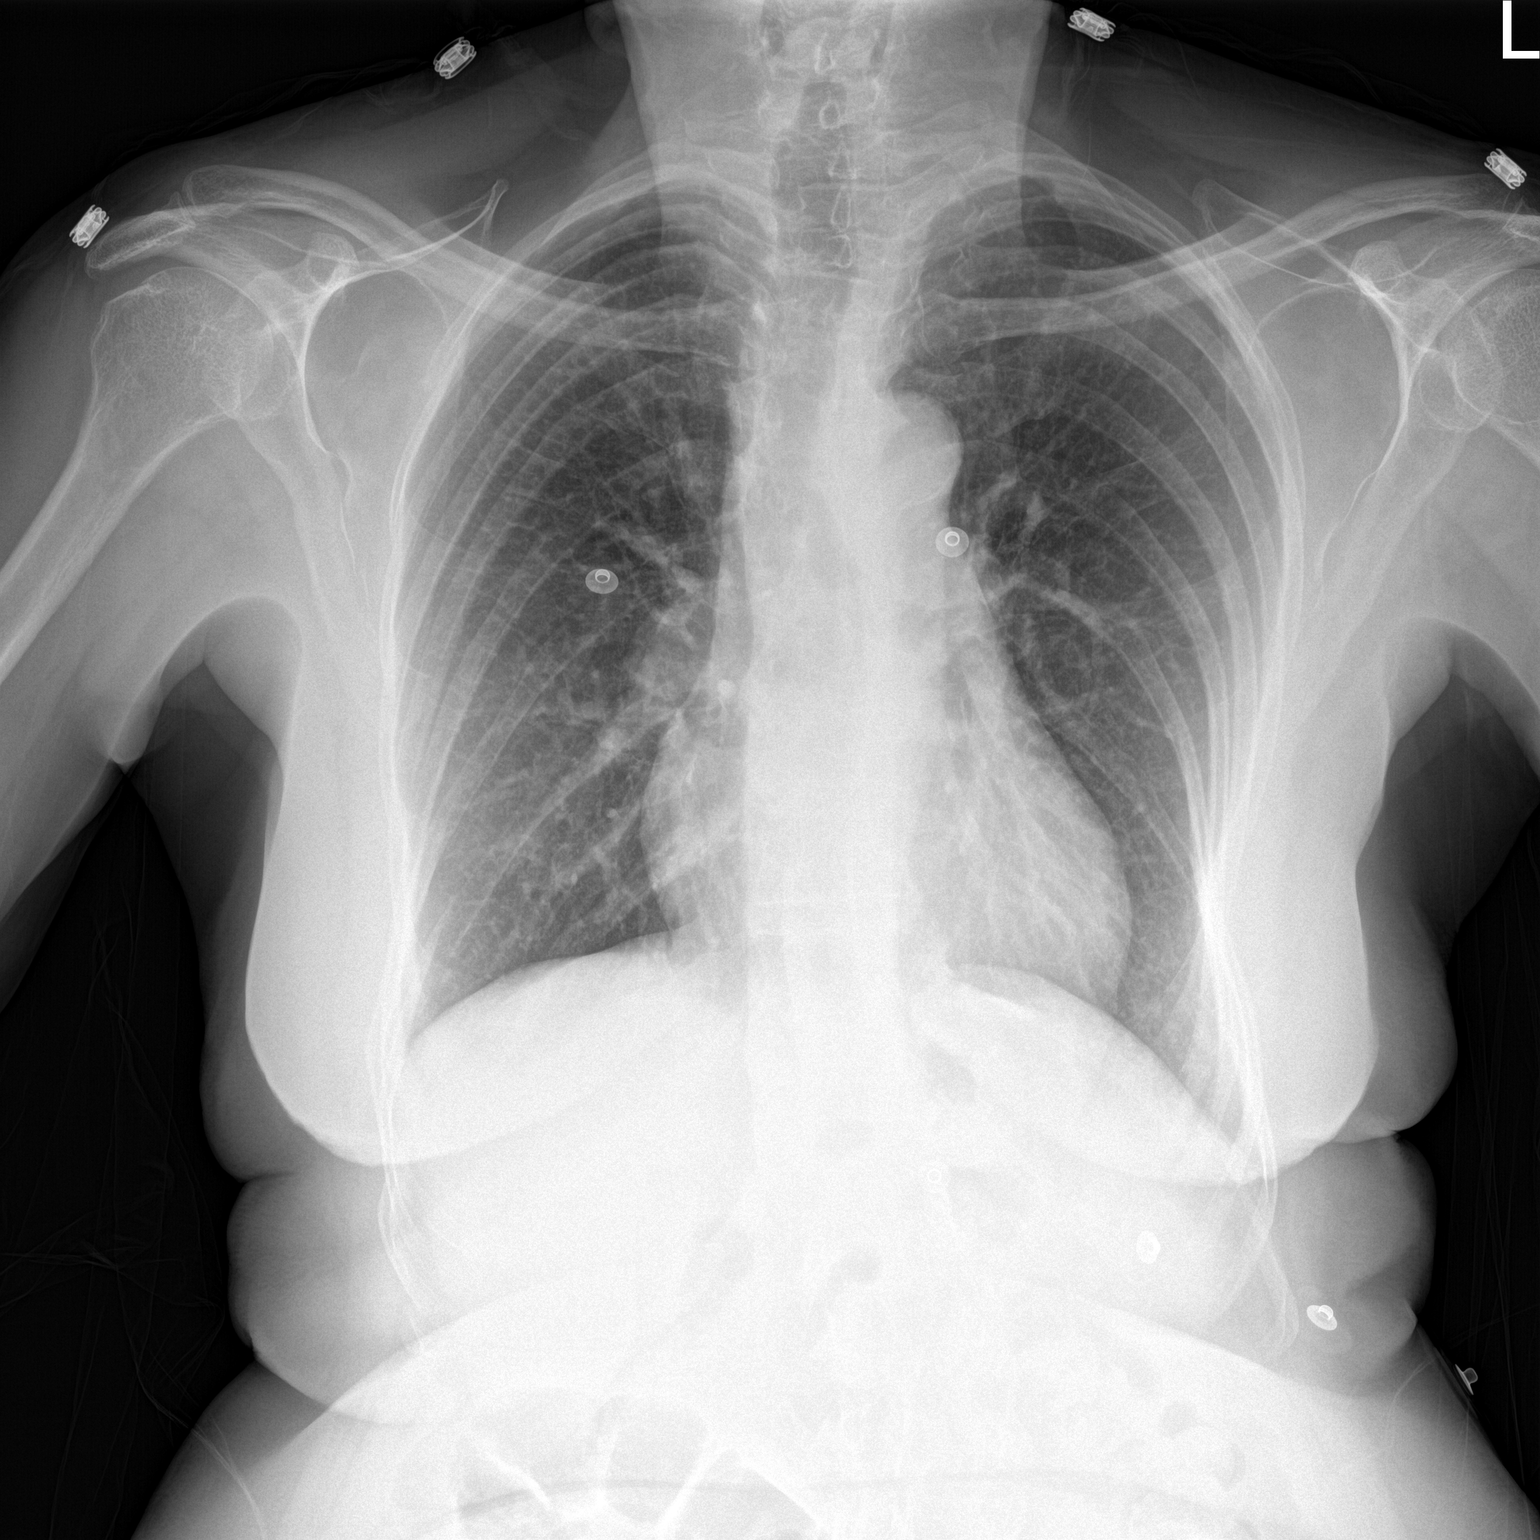

[chest lat]
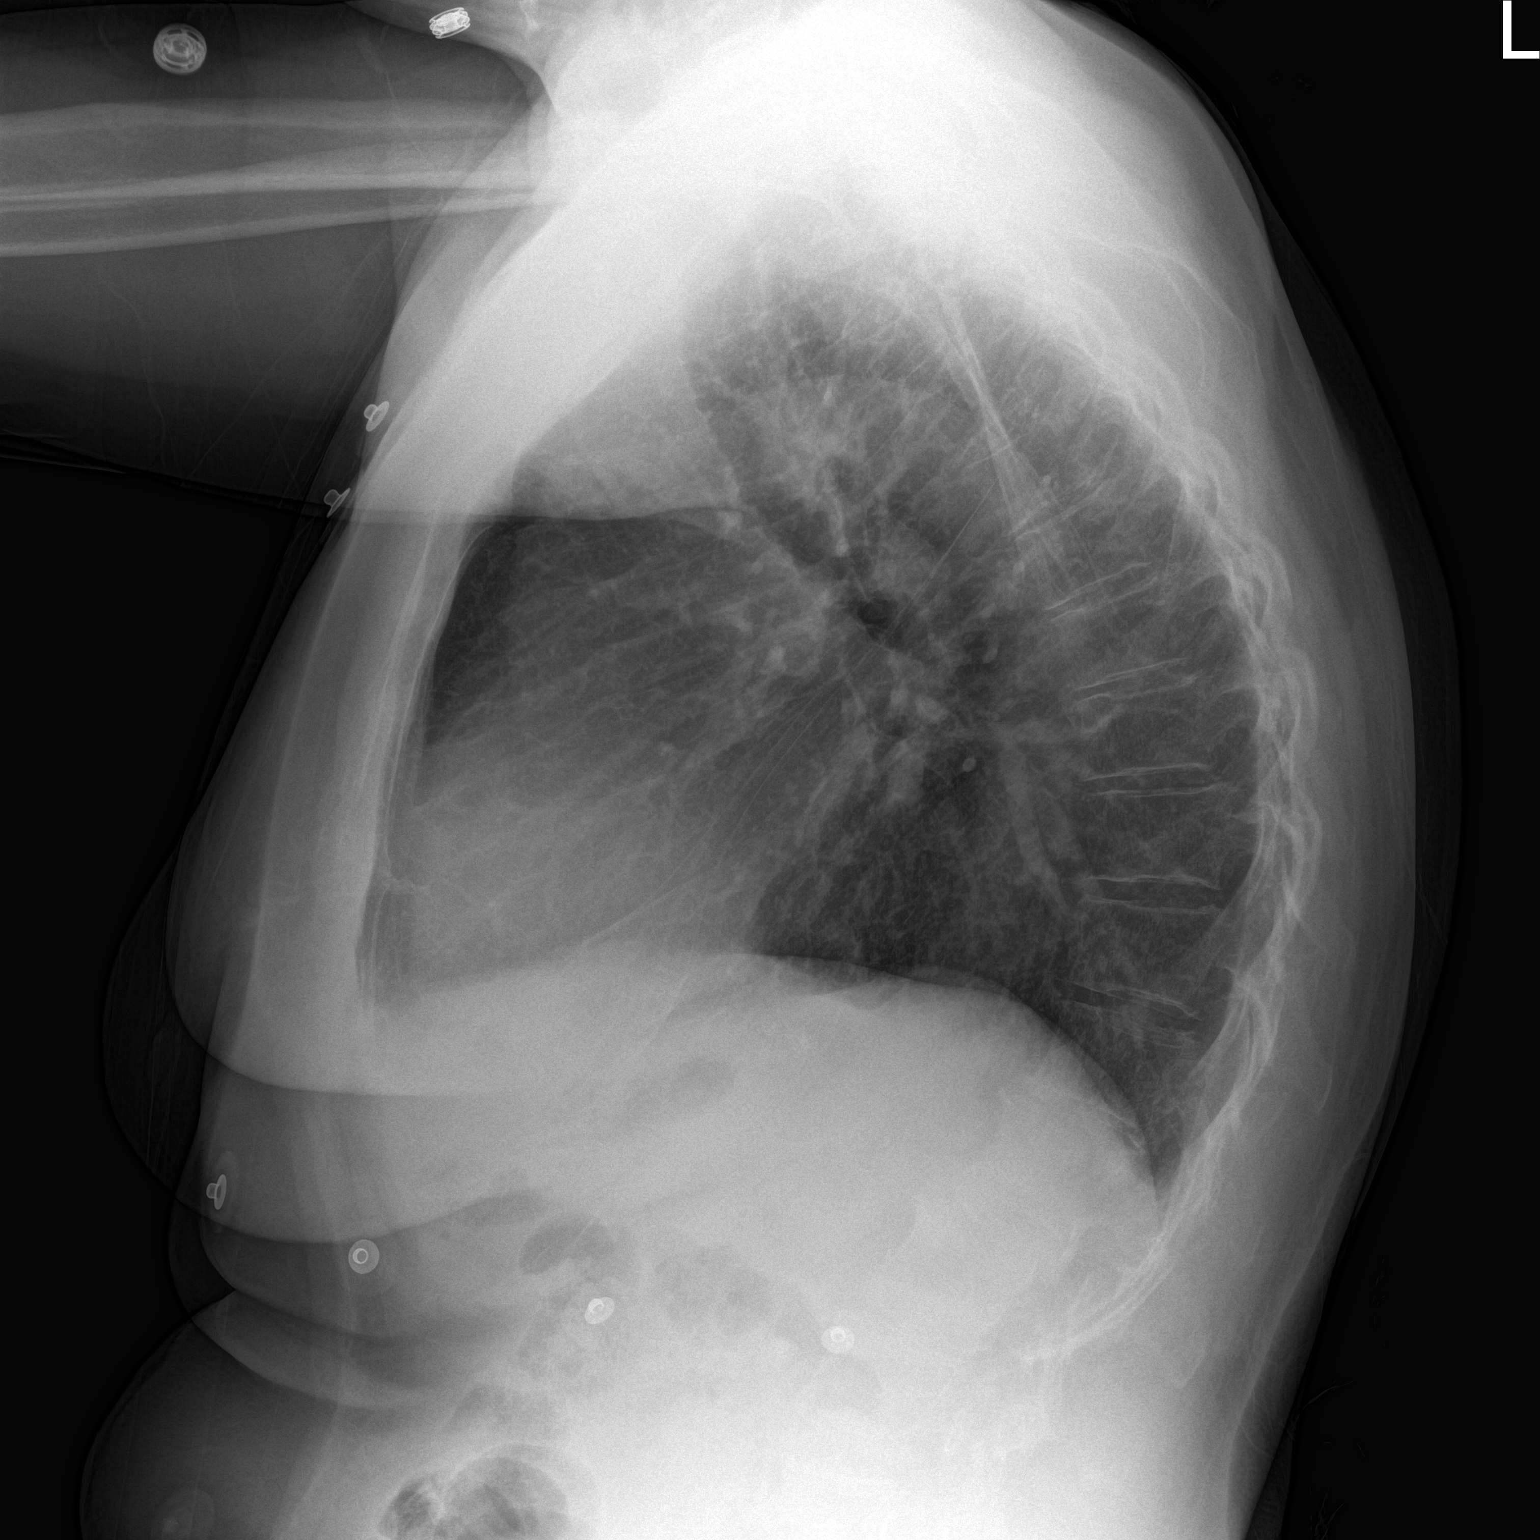

[2 of 2 positions shown; findings below may reference images not displayed]

FINDINGS: There is borderline cardiomegaly. Pulmonary vascularity is within
normal limits in the lungs are clear. No acute osseous abnormality.
Chronic accentuation of the thoracic kyphosis. No effusions.
IMPRESSION: No acute abnormality.  Chronic borderline cardiomegaly.

## 2016-11-19 DIAGNOSIS — H401133 Primary open-angle glaucoma, bilateral, severe stage: Secondary | ICD-10-CM | POA: Diagnosis not present

## 2016-11-20 DIAGNOSIS — F331 Major depressive disorder, recurrent, moderate: Secondary | ICD-10-CM | POA: Diagnosis not present

## 2016-11-20 DIAGNOSIS — Z6829 Body mass index (BMI) 29.0-29.9, adult: Secondary | ICD-10-CM | POA: Diagnosis not present

## 2016-11-20 DIAGNOSIS — M549 Dorsalgia, unspecified: Secondary | ICD-10-CM | POA: Diagnosis not present

## 2016-11-20 DIAGNOSIS — I10 Essential (primary) hypertension: Secondary | ICD-10-CM | POA: Diagnosis not present

## 2016-11-21 DIAGNOSIS — M170 Bilateral primary osteoarthritis of knee: Secondary | ICD-10-CM | POA: Diagnosis not present

## 2016-12-07 ENCOUNTER — Telehealth: Payer: Self-pay | Admitting: Hematology

## 2016-12-07 ENCOUNTER — Encounter: Payer: Self-pay | Admitting: Hematology

## 2016-12-07 NOTE — Telephone Encounter (Signed)
Appt has been scheduled for the pt to see Dr. Irene Limbo on 3/27 at [facility] a former pt of Dr. Lamonte Sakai. Demographics verified. Aware to arrive 30 minutes early. Letter mailed.

## 2016-12-21 ENCOUNTER — Encounter (HOSPITAL_COMMUNITY): Payer: Self-pay | Admitting: Emergency Medicine

## 2016-12-21 ENCOUNTER — Emergency Department (HOSPITAL_COMMUNITY)
Admission: EM | Admit: 2016-12-21 | Discharge: 2016-12-21 | Disposition: A | Discharge status: Home / Self Care | Payer: Medicare Other | Attending: Emergency Medicine | Admitting: Emergency Medicine

## 2016-12-21 ENCOUNTER — Emergency Department (HOSPITAL_COMMUNITY): Payer: Medicare Other

## 2016-12-21 DIAGNOSIS — I10 Essential (primary) hypertension: Secondary | ICD-10-CM | POA: Insufficient documentation

## 2016-12-21 DIAGNOSIS — R55 Syncope and collapse: Secondary | ICD-10-CM | POA: Insufficient documentation

## 2016-12-21 DIAGNOSIS — E860 Dehydration: Secondary | ICD-10-CM | POA: Diagnosis not present

## 2016-12-21 DIAGNOSIS — R05 Cough: Secondary | ICD-10-CM | POA: Diagnosis not present

## 2016-12-21 DIAGNOSIS — J111 Influenza due to unidentified influenza virus with other respiratory manifestations: Secondary | ICD-10-CM

## 2016-12-21 LAB — BASIC METABOLIC PANEL
Anion gap: 9 (ref 5–15)
BUN: 11 mg/dL (ref 6–20)
CHLORIDE: 97 mmol/L — AB (ref 101–111)
CO2: 25 mmol/L (ref 22–32)
Calcium: 8.9 mg/dL (ref 8.9–10.3)
Creatinine, Ser: 1.21 mg/dL — ABNORMAL HIGH (ref 0.44–1.00)
GFR calc Af Amer: 50 mL/min — ABNORMAL LOW (ref >60)
GFR calc non Af Amer: 43 mL/min — ABNORMAL LOW (ref >60)
GLUCOSE: 106 mg/dL — AB (ref 65–99)
POTASSIUM: 4 mmol/L (ref 3.5–5.1)
Sodium: 131 mmol/L — ABNORMAL LOW (ref 135–145)

## 2016-12-21 LAB — URINALYSIS, ROUTINE W REFLEX MICROSCOPIC
BILIRUBIN URINE: NEGATIVE
GLUCOSE, UA: NEGATIVE mg/dL
Hgb urine dipstick: NEGATIVE
KETONES UR: NEGATIVE mg/dL
Leukocytes, UA: NEGATIVE
Nitrite: NEGATIVE
PH: 7 (ref 5.0–8.0)
Protein, ur: NEGATIVE mg/dL
SPECIFIC GRAVITY, URINE: 1.006 (ref 1.005–1.030)

## 2016-12-21 LAB — INFLUENZA PANEL BY PCR (TYPE A & B)
INFLBPCR: NEGATIVE
Influenza A By PCR: POSITIVE — AB

## 2016-12-21 LAB — CBC
HEMATOCRIT: 43 % (ref 36.0–46.0)
HEMOGLOBIN: 14.2 g/dL (ref 12.0–15.0)
MCH: 31 pg (ref 26.0–34.0)
MCHC: 33 g/dL (ref 30.0–36.0)
MCV: 93.9 fL (ref 78.0–100.0)
Platelets: 145 10*3/uL — ABNORMAL LOW (ref 150–400)
RBC: 4.58 MIL/uL (ref 3.87–5.11)
RDW: 12.8 % (ref 11.5–15.5)
WBC: 4.3 10*3/uL (ref 4.0–10.5)

## 2016-12-21 LAB — I-STAT CG4 LACTIC ACID, ED: Lactic Acid, Venous: 1.67 mmol/L (ref 0.5–1.9)

## 2016-12-21 MED ORDER — BENZONATATE 100 MG PO CAPS: 100.0000 mg | ORAL_CAPSULE | Freq: Three times a day (TID) | ORAL | 0 refills | Status: DC | PRN

## 2016-12-21 MED ORDER — BENZONATATE 100 MG PO CAPS
100.0000 mg | ORAL_CAPSULE | Freq: Once | ORAL | Status: AC
  Administered 2016-12-21: 100 mg via ORAL
  Filled 2016-12-21: qty 1

## 2016-12-21 MED ORDER — ACETAMINOPHEN 325 MG PO TABS
650.0000 mg | ORAL_TABLET | Freq: Once | ORAL | Status: AC
  Administered 2016-12-21: 650 mg via ORAL
  Filled 2016-12-21: qty 2

## 2016-12-21 MED ORDER — SODIUM CHLORIDE 0.9 % IV BOLUS (SEPSIS)
1000.0000 mL | Freq: Once | INTRAVENOUS | Status: AC
  Administered 2016-12-21: 1000 mL via INTRAVENOUS

## 2016-12-21 MED ORDER — SODIUM CHLORIDE 0.9 % IV BOLUS (SEPSIS)
2000.0000 mL | Freq: Once | INTRAVENOUS | Status: AC
  Administered 2016-12-21: 2000 mL via INTRAVENOUS

## 2016-12-21 NOTE — ED Notes (Addendum)
Pt. States she understands instructions and is home stable. With husband via [place].Marland KitchenPt denies dizziness while standing.

## 2016-12-21 NOTE — ED Provider Notes (Signed)
[place] DEPT Provider Note   CSN: [id number] Arrival date & time: 12/21/16  1120     History   Chief Complaint Chief Complaint  Patient presents with  . Influenza  . Near Syncope    HPI [name] is a 76 y.o. female.ComPlains of cough and "flulike symptoms" for the past 3 days. Associated symptoms include diffuse myalgias headache, subjective fever and sore throat. She presents today as she reports that every time she stands up she faints. Lightheadedness is worse with standing improved with lying down. Treated with TheraFlu, without relief. Associated symptoms include diminished appetite. No other associated symptoms  HPI  Past Medical History:  Diagnosis Date  . Anal or rectal pain    sometimes  . Arthritis   . Chronic back pain   . Degenerative joint disease   . Depression   . Diverticulosis 2003  . Elevated total protein   . Esophageal dysmotility   . Fibromyalgia   . GERD (gastroesophageal reflux disease)    subsequent Nissen Fundoplication  . Glaucoma   . Hearing loss   . Hemorrhoids   . Hiatal hernia 11/08/09  . Hx of adenomatous colonic polyps 07/02/02  . Hypercalcemia   . Hyperlipidemia   . Hyperlipidemia   . Hypertension   . Nausea   . Osteoporosis   . Rectal bleeding    from hemorrhoids.    . Sleep apnea   . Thrombocytopenia [facility])     Patient Active Problem List   Diagnosis Date Noted  . Bradycardia 07/27/2016  . Cough   . Essential hypertension 11/24/2014  . Gonalgia 05/13/2013  . Cellulitis 05/13/2013  . Arthritis, degenerative 05/13/2013  . Degenerative joint disease involving multiple joints 01/19/2013  . Rheumatoid arthritis ([place]) 01/19/2013  . Thyroid nodule 06/11/2012  . Thrombocytopenia ([place])   . Elevated total protein   . Hypercalcemia   . Cyst of thyroid 10/29/2011  . Hemorrhoids, internal 05/28/2011  . Acid reflux 02/14/2011  . BP (high blood pressure) 02/14/2011  . Avitaminosis D 02/13/2011  . Glaucoma 11/28/2010  .  HYPERLIPIDEMIA 12/14/2009  . HEMORRHOIDS 12/14/2009  . DIVERTICULOSIS, COLON 12/14/2009  . HIP PAIN, CHRONIC 12/14/2009  . BACK PAIN, CHRONIC 12/14/2009  . FIBROMYALGIA 12/14/2009  . OSTEOPOROSIS 12/14/2009  . SLEEP APNEA 12/14/2009  . COLONIC POLYPS, ADENOMATOUS, HX OF 12/14/2009  . GASTRIC POLYP, HX OF 12/14/2009  . HIATAL HERNIA 10/04/2009  . DEPRESSION 09/29/2009  . REFLUX ESOPHAGITIS 09/29/2009  . GERD 09/29/2009  . CONSTIPATION 09/29/2009  . DYSPHAGIA 09/29/2009    Past Surgical History:  Procedure Laterality Date  . CARDIAC CATHETERIZATION  03/14/1992   Normal cardiac cath. Normal LV function.  [name] CARDIOVASCULAR STRESS TEST  01/22/2011   No scintigraphic evidence of inducible ischemia.  [name] CAROTID DOPPLER  03/31/2007   Bilateral ICAs - no evidence of significant diameter reduction, dissectin, tortuosity, FMD, or any other vascular abnormality.  . ESOPHAGEAL MANOMETRY N/A 11/14/2015   Procedure: ESOPHAGEAL MANOMETRY (EM);  Surgeon: Mauri Pole, MD;  Location: [facility] ENDOSCOPY;  Service: Endoscopy;  Laterality: N/A;  . GASTRIC RESECTION  2009  . NISSEN FUNDOPLICATION  7829   with subsequent takedown in 2009  . TRANSTHORACIC ECHOCARDIOGRAM  12/21/2010   EF 60%, moderate LVH,     OB History    No data available       Home Medications    Prior to Admission medications   Medication Sig Start Date End Date Taking? Authorizing Provider  acetaminophen (TYLENOL) 325 MG tablet  Take 650 mg by mouth as needed.    Historical Provider, MD  Albuterol Sulfate (PROAIR RESPICLICK) 885 (90 Base) MCG/ACT AEPB Inhale 2 puffs into the lungs every 6 (six) hours as needed. 05/02/16   Praveen Mannam, MD  Azelastine-Fluticasone (DYMISTA) 137-50 MCG/ACT SUSP Place 1 spray into the nose 2 (two) times daily. 05/02/16   Praveen Mannam, MD  benzonatate (TESSALON PERLES) 100 MG capsule Take 1 capsule (100 mg total) by mouth 3 (three) times daily. 12/28/15   Lori P Hvozdovic, PA-C  bimatoprost (LUMIGAN)  0.03 % ophthalmic solution Place 1 drop into both eyes at bedtime.    Historical Provider, MD  Cholecalciferol (VITAMIN D) 2000 UNITS CAPS Take 6,000 Units by mouth daily.    Historical Provider, MD  FLUoxetine (PROZAC) 20 MG tablet Take 20 mg by mouth daily.    Historical Provider, MD  losartan (COZAAR) 50 MG tablet Take 25 mg by mouth daily.    Historical Provider, MD  montelukast (SINGULAIR) 10 MG tablet Take 10 mg by mouth at bedtime.    Historical Provider, MD  omeprazole (PRILOSEC) 40 MG capsule Take 40 mg by mouth 2 (two) times daily.     Historical Provider, MD  ranitidine (ZANTAC) 300 MG tablet TAKE 1 TABLET AT BEDTIME AS NEEDED FOR HEARTBURN 10/16/16   Mauri Pole, MD  rOPINIRole (REQUIP) 0.25 MG tablet Take 1 tablet (0.25 mg total) by mouth at bedtime. 03/27/16   Landis Martins, DPM  rosuvastatin (CRESTOR) 20 MG tablet Take 20 mg by mouth daily.    Historical Provider, MD  timolol (BETIMOL) 0.5 % ophthalmic solution Place 1 drop into both eyes 2 (two) times daily.    Historical Provider, MD  tiZANidine (ZANAFLEX) 4 MG tablet Take 4 mg by mouth as needed for muscle spasms.    Historical Provider, MD    Family History Family History  Problem Relation Age of Onset  . Heart disease Mother   . Hypertension Mother   . Diabetes Mother   . Diabetes Father   . Kidney disease Father   . Breast cancer Daughter   . Stomach cancer Maternal Aunt   . Leukemia Maternal Uncle   . Prostate cancer Maternal Uncle   . Stroke Brother   . Cancer Maternal Grandmother   . Kidney disease Maternal Grandfather   . Uterine cancer Maternal Aunt   . Colon cancer Neg Hx     Social History Social History  Substance Use Topics  . Smoking status: Never Smoker  . Smokeless tobacco: Never Used  . Alcohol use No     Allergies   Adalat [nifedipine]; Delacort [hydrocortisone]; Hydrocodone; Meperidine hcl; and Naproxen   Review of Systems Review of Systems  HENT: Positive for sore throat.     Respiratory: Positive for cough.   Musculoskeletal: Positive for myalgias.  Neurological: Positive for headaches.  All other systems reviewed and are negative.    Physical Exam Updated Vital Signs BP 147/71   Pulse 66   Temp 100.5 F (38.1 C) (Oral)   Resp 16   SpO2 95%   Physical Exam  Constitutional: She is oriented to person, place, and time. She appears well-developed and well-nourished.  Mucous membranes dry  HENT:  Head: Normocephalic and atraumatic.  Eyes: Conjunctivae are normal. Pupils are equal, round, and reactive to light.  Neck: Neck supple. No tracheal deviation present. No thyromegaly present.  Cardiovascular: Normal rate and regular rhythm.   No murmur heard. Pulmonary/Chest: Effort normal.  Scant diffuse rhonchi.  Coughing frequently  Abdominal: Soft. Bowel sounds are normal. She exhibits no distension. There is no tenderness.  Musculoskeletal: Normal range of motion. She exhibits no edema or tenderness.  Neurological: She is alert and oriented to person, place, and time. Coordination normal.  Skin: Skin is warm and dry. No rash noted.  Psychiatric: She has a normal mood and affect.  Nursing note and vitals reviewed.    ED Treatments / Results  Labs (all labs ordered are listed, but only abnormal results are displayed) Labs Reviewed  BASIC METABOLIC PANEL - Abnormal; Notable for the following:       Result Value   Sodium 131 (*)    Chloride 97 (*)    Glucose, Bld 106 (*)    Creatinine, Ser 1.21 (*)    GFR calc non Af Amer 43 (*)    GFR calc Af Amer 50 (*)    All other components within normal limits  CBC - Abnormal; Notable for the following:    Platelets 145 (*)    All other components within normal limits  CULTURE, BLOOD (ROUTINE X 2)  CULTURE, BLOOD (ROUTINE X 2)  URINALYSIS, ROUTINE W REFLEX MICROSCOPIC  INFLUENZA PANEL BY PCR (TYPE A & B)  I-STAT CG4 LACTIC ACID, ED   Chest x-ray viewed by me EKG  EKG  Interpretation  Date/Time:  Friday December 21 2016 11:28:22 EST Ventricular Rate:  70 PR Interval:  158 QRS Duration: 84 QT Interval:  386 QTC Calculation: 416 R Axis:   10 Text Interpretation:  Normal sinus rhythm Possible Left atrial enlargement Left ventricular hypertrophy Abnormal ECG SINCE LAST TRACING HEART RATE HAS INCREASED Confirmed by Winfred Leeds  MD, Andruw Battie ([id number]) on 12/21/2016 12:30:44 PM       Radiology No results found.  Procedures Procedures (including critical care time)  Medications Ordered in ED Medications  sodium chloride 0.9 % bolus 2,000 mL (2,000 mLs Intravenous New Bag/Given 12/21/16 1244)     Initial Impression / Assessment and Plan / ED Course  I have reviewed the triage vital signs and the nursing notes.  Pertinent labs & imaging results that were available during my care of the patient were reviewed by me and considered in my medical decision making (see chart for details).   3:15 PM feels improved after treatment with intravenous hydration. Still feels somewhat weak. Additional IV normal saline bolus ordered. Patient also given fluids to drink  5:40 PM after additional IV fluids patient feels ready to go home. She is minimally lightheaded on standing. Plan IV hydration encouraged. Near-syncope felt secondary to dehydration and orthostasis.  Final Clinical Impressions(s) / ED Diagnoses  Diagnosis #1 influenza #2 near-syncope #3 dehydration Final diagnoses:  None    New Prescriptions New Prescriptions   No medications on file     Orlie Dakin, MD 12/21/16 1747

## 2016-12-21 NOTE — Discharge Instructions (Signed)
Lab work today shows that you have flu. You are feeling fainty because of dehydration Make sure that you drink at least six 8 ounce glasses of water or Gatorade each day in order to stay well-hydrated. Call Dr.[name] to arrange to be seen in the office if not feeling better by next week. Take Tylenol every 4 hours as directed for fever higher than 100.4 or for aches. Return if you feel worse for any reason

## 2016-12-21 NOTE — ED Triage Notes (Signed)
Pt to ER by private vehicle with complaint of sore throat, body aches, and cough x3 days. Reports starting today feeling near syncopal when she stands up. VSS at present. A/o x4.

## 2016-12-21 NOTE — ED Notes (Addendum)
Pt c/o pain with cough but constant back pain

## 2016-12-21 NOTE — ED Notes (Signed)
Pt becomes dizzy while standing.

## 2016-12-26 LAB — CULTURE, BLOOD (ROUTINE X 2)
CULTURE: NO GROWTH
Culture: NO GROWTH

## 2017-01-01 DIAGNOSIS — J111 Influenza due to unidentified influenza virus with other respiratory manifestations: Secondary | ICD-10-CM | POA: Diagnosis not present

## 2017-01-01 DIAGNOSIS — I10 Essential (primary) hypertension: Secondary | ICD-10-CM | POA: Diagnosis not present

## 2017-01-01 DIAGNOSIS — E860 Dehydration: Secondary | ICD-10-CM | POA: Diagnosis not present

## 2017-01-01 DIAGNOSIS — Z6829 Body mass index (BMI) 29.0-29.9, adult: Secondary | ICD-10-CM | POA: Diagnosis not present

## 2017-01-04 DIAGNOSIS — M170 Bilateral primary osteoarthritis of knee: Secondary | ICD-10-CM | POA: Diagnosis not present

## 2017-01-04 DIAGNOSIS — M1711 Unilateral primary osteoarthritis, right knee: Secondary | ICD-10-CM | POA: Diagnosis not present

## 2017-01-07 DIAGNOSIS — H401133 Primary open-angle glaucoma, bilateral, severe stage: Secondary | ICD-10-CM | POA: Diagnosis not present

## 2017-01-08 ENCOUNTER — Ambulatory Visit (HOSPITAL_BASED_OUTPATIENT_CLINIC_OR_DEPARTMENT_OTHER): Payer: Medicare Other | Admitting: Hematology

## 2017-01-08 ENCOUNTER — Ambulatory Visit (HOSPITAL_BASED_OUTPATIENT_CLINIC_OR_DEPARTMENT_OTHER): Payer: Medicare Other

## 2017-01-08 ENCOUNTER — Telehealth: Payer: Self-pay | Admitting: Hematology

## 2017-01-08 ENCOUNTER — Encounter: Payer: Self-pay | Admitting: Hematology

## 2017-01-08 VITALS — BP 124/48 | HR 68 | Temp 98.7°F | Resp 18 | Ht 71.0 in | Wt 196.3 lb

## 2017-01-08 DIAGNOSIS — I825Y9 Chronic embolism and thrombosis of unspecified deep veins of unspecified proximal lower extremity: Secondary | ICD-10-CM

## 2017-01-08 DIAGNOSIS — E875 Hyperkalemia: Secondary | ICD-10-CM | POA: Diagnosis not present

## 2017-01-08 DIAGNOSIS — Z86718 Personal history of other venous thrombosis and embolism: Secondary | ICD-10-CM

## 2017-01-08 DIAGNOSIS — I8393 Asymptomatic varicose veins of bilateral lower extremities: Secondary | ICD-10-CM

## 2017-01-08 LAB — CBC & DIFF AND RETIC
BASO%: 0.4 % (ref 0.0–2.0)
BASOS ABS: 0 10*3/uL (ref 0.0–0.1)
EOS%: 3.5 % (ref 0.0–7.0)
Eosinophils Absolute: 0.2 10*3/uL (ref 0.0–0.5)
HEMATOCRIT: 39.3 % (ref 34.8–46.6)
HGB: 12.7 g/dL (ref 11.6–15.9)
Immature Retic Fract: 4.1 % (ref 1.60–10.00)
LYMPH#: 2.3 10*3/uL (ref 0.9–3.3)
LYMPH%: 39.8 % (ref 14.0–49.7)
MCH: 30.8 pg (ref 25.1–34.0)
MCHC: 32.3 g/dL (ref 31.5–36.0)
MCV: 95.2 fL (ref 79.5–101.0)
MONO#: 0.8 10*3/uL (ref 0.1–0.9)
MONO%: 14.5 % — AB (ref 0.0–14.0)
NEUT%: 41.8 % (ref 38.4–76.8)
NEUTROS ABS: 2.4 10*3/uL (ref 1.5–6.5)
Platelets: 182 10*3/uL (ref 145–400)
RBC: 4.13 10*6/uL (ref 3.70–5.45)
RDW: 12.9 % (ref 11.2–14.5)
RETIC %: 1.27 % (ref 0.70–2.10)
RETIC CT ABS: 52.45 10*3/uL (ref 33.70–90.70)
WBC: 5.7 10*3/uL (ref 3.9–10.3)

## 2017-01-08 LAB — COMPREHENSIVE METABOLIC PANEL
ALT: 18 U/L (ref 0–55)
AST: 22 U/L (ref 5–34)
Albumin: 3.8 g/dL (ref 3.5–5.0)
Alkaline Phosphatase: 71 U/L (ref 40–150)
Anion Gap: 6 mEq/L (ref 3–11)
BUN: 16.6 mg/dL (ref 7.0–26.0)
CALCIUM: 10.4 mg/dL (ref 8.4–10.4)
CHLORIDE: 103 meq/L (ref 98–109)
CO2: 32 meq/L — AB (ref 22–29)
Creatinine: 1 mg/dL (ref 0.6–1.1)
EGFR: 67 mL/min/{1.73_m2} — ABNORMAL LOW (ref >90)
Glucose: 81 mg/dl (ref 70–140)
POTASSIUM: 5.3 meq/L — AB (ref 3.5–5.1)
Sodium: 141 mEq/L (ref 136–145)
Total Bilirubin: 0.46 mg/dL (ref 0.20–1.20)
Total Protein: 7.3 g/dL (ref 6.4–8.3)

## 2017-01-08 NOTE — Progress Notes (Signed)
[name]    HEMATOLOGY/ONCOLOGY CONSULTATION NOTE  Date of Service: 01/08/2017  Patient Care Team: Fanny Bien, MD as PCP - General (Family Medicine) Lorenza Burton, FNP (Nurse Practitioner) Johnathan Hausen, MD as Attending Physician (General Surgery)  CHIEF COMPLAINTS/PURPOSE OF CONSULTATION:  h/o DVT  HISTORY OF PRESENTING ILLNESS:   [name] is a wonderful 76 y.o. female, orginally from [place] who has been referred to [place] by Dr Gaynelle Arabian MD for evaluation and management of h/o DVT.  Patient has a h/o hypertension, dyslipidemia, fibromyalgia, GERD, sleep apnea, possible rheumatoid arthritis, osteoarthritis and was recently evaluated by Dr. Wynelle Link for consideration of TKA for significant arthritis.  She mentions that she had a previous history of DVT and therefore she was referred to [place] for further evaluation and recommendations in light of future planned knee surgery.  Patient notes that she has had a DVT 30 years ago when she was in [place]. Has very little memory above the details of which leg does DVT was in, which anticoagulant she was treated with and for how long. She notes that she was likely pregnant when she had the DVT and this could certainly be the trigger factor. She notes that she was not recommended long-term anticoagulation. She does not remember if she had any hypercoagulable workup done. Notes that she also has long-standing bilateral varicose veins which were also noted to be a risk factor for her DVT. She does not recollect if she had a second episode and cannot provide any additional information.  No family history of DVT. She has had 8 pregnancies and several surgical procedures without any triggered blood clots in the past. Nonsmoker no significant alcohol use.  No chronic symptoms suggestive of post thrombotic syndrome. 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   MEDICAL HISTORY:  Past Medical History:  Diagnosis Date  . Anal or rectal pain    sometimes  . Arthritis   . Chronic back pain   . Degenerative joint disease   . Depression   . Diverticulosis 2003  . Elevated total protein   . Esophageal dysmotility   . Fibromyalgia   . GERD (gastroesophageal reflux disease)    subsequent Nissen Fundoplication  . Glaucoma   . Hearing loss   . Hemorrhoids   . Hiatal hernia 11/08/09  . Hx of adenomatous colonic polyps 07/02/02  . Hypercalcemia   . Hyperlipidemia   . Hyperlipidemia   . Hypertension   . Nausea   . Osteoporosis   . Rectal bleeding    from hemorrhoids.    . Sleep apnea   . Thrombocytopenia ([place])     SURGICAL HISTORY: Past Surgical History:  Procedure Laterality Date  . CARDIAC CATHETERIZATION  03/14/1992   Normal cardiac cath. Normal LV function.  [name] CARDIOVASCULAR STRESS TEST  01/22/2011   No scintigraphic evidence of inducible ischemia.  [name] CAROTID DOPPLER  03/31/2007   Bilateral ICAs - no evidence of significant diameter reduction, dissectin, tortuosity, FMD, or any other vascular abnormality.  . ESOPHAGEAL MANOMETRY N/A 11/14/2015   Procedure: ESOPHAGEAL MANOMETRY (EM);  Surgeon: Mauri Pole, MD;  Location: [facility];  Service: Endoscopy;  Laterality: N/A;  . GASTRIC RESECTION  2009  . NISSEN FUNDOPLICATION  1610   with subsequent takedown in 2009  . TRANSTHORACIC ECHOCARDIOGRAM  12/21/2010   EF 60%, moderate LVH,     SOCIAL HISTORY: Social History   Social History  . Marital status: Married    Spouse name: N/A  . Number of children:  8  . Years of education: N/A   Occupational History  . retired     retired Therapist, art.    Social History Main Topics  . Smoking status: Never Smoker  . Smokeless tobacco: Never Used  . Alcohol use No  . Drug use: No  . Sexual activity: Not on file   Other Topics Concern  . Not on file   Social History Narrative   Husband, Sierah Lacewell is Next of Kin. Cell # [phone]    FAMILY HISTORY: Family History  Problem Relation Age of Onset  . Heart  disease Mother   . Hypertension Mother   . Diabetes Mother   . Diabetes Father   . Kidney disease Father   . Breast cancer Daughter   . Stomach cancer Maternal Aunt   . Leukemia Maternal Uncle   . Prostate cancer Maternal Uncle   . Stroke Brother   . Cancer Maternal Grandmother   . Kidney disease Maternal Grandfather   . Uterine cancer Maternal Aunt   . Colon cancer Neg Hx     ALLERGIES:  is allergic to adalat [nifedipine]; delacort [hydrocortisone]; hydrocodone; meperidine hcl; and naproxen.  MEDICATIONS:  Current Outpatient Prescriptions  Medication Sig Dispense Refill  . diclofenac sodium (VOLTAREN) 1 % GEL Apply topically 4 (four) times daily.    [name] loratadine (CLARITIN) 10 MG tablet Take 10 mg by mouth daily.    [name] acetaminophen (TYLENOL) 500 MG tablet Take 500-1,000 mg by mouth every 6 (six) hours as needed for mild pain.     . Albuterol Sulfate (PROAIR RESPICLICK) 096 (90 Base) MCG/ACT AEPB Inhale 2 puffs into the lungs every 6 (six) hours as needed. 1 each 5  . Azelastine-Fluticasone (DYMISTA) 137-50 MCG/ACT SUSP Place 1 spray into the nose 2 (two) times daily. 23 g 5  . benzonatate (TESSALON) 100 MG capsule Take 1 capsule (100 mg total) by mouth 3 (three) times daily as needed for cough. 21 capsule 0  . bimatoprost (LUMIGAN) 0.03 % ophthalmic solution Place 1 drop into both eyes at bedtime.    . Cholecalciferol (VITAMIN D) 2000 UNITS CAPS Take 6,000 Units by mouth daily.    [name] FLUoxetine (PROZAC) 20 MG tablet Take 20 mg by mouth daily.    [name] losartan (COZAAR) 50 MG tablet Take 25 mg by mouth daily.    . montelukast (SINGULAIR) 10 MG tablet Take 10 mg by mouth at bedtime.    [name] nystatin (MYCOSTATIN/NYSTOP) powder 1 TO 2 TIMES A DAY  3  . nystatin ointment (MYCOSTATIN) 1 APPLICATION TOPICALLY TO AFFECTED AREA 2 TIMES PER DAY  0  . omega-3 fish oil (MAXEPA) 1000 MG CAPS capsule Take 1 capsule by mouth daily.    [name] omeprazole (PRILOSEC) 40 MG capsule Take 40 mg by mouth daily.       . ranitidine (ZANTAC) 300 MG tablet TAKE 1 TABLET AT BEDTIME AS NEEDED FOR HEARTBURN 30 tablet 6  . rosuvastatin (CRESTOR) 40 MG tablet Take 40 mg by mouth daily.      No current facility-administered medications for this visit.     REVIEW OF SYSTEMS:    10 Point review of Systems was done is negative except as noted above.  PHYSICAL EXAMINATION: ECOG PERFORMANCE STATUS: 1 - Symptomatic but completely ambulatory  . Vitals:   01/08/17 1118  BP: (!) 124/48  Pulse: 68  Resp: 18  Temp: 98.7 F (37.1 C)   Filed Weights   01/08/17 1118  Weight: 196 lb 4.8 oz (  89 kg)   .Body mass index is 27.38 kg/m.  GENERAL:alert, in no acute distress and comfortable SKIN: no acute rashes, no significant lesions EYES: conjunctiva are pink and non-injected, sclera anicteric OROPHARYNX: MMM, no exudates, no oropharyngeal erythema or ulceration NECK: supple, no JVD LYMPH:  no palpable lymphadenopathy in the cervical, axillary or inguinal regions LUNGS: clear to auscultation b/l with normal respiratory effort HEART: regular rate & rhythm ABDOMEN:  normoactive bowel sounds , non tender, not distended. Extremity: Trace pedal edema, varicose veins bilaterally PSYCH: alert & oriented x 3 with fluent speech NEURO: no focal motor/sensory deficits  LABORATORY DATA:  I have reviewed the data as listed  . CBC Latest Ref Rng & Units 01/08/2017 12/21/2016 06/09/2015  WBC 3.9 - 10.3 10e3/uL 5.7 4.3 4.8  Hemoglobin 11.6 - 15.9 g/dL 12.7 14.2 12.6  Hematocrit 34.8 - 46.6 % 39.3 43.0 37.5  Platelets 145 - 400 10e3/uL 182 145(L) 149(L)    . CMP Latest Ref Rng & Units 01/08/2017 12/21/2016 06/09/2015  Glucose 70 - 140 mg/dl 81 106(H) 140(H)  BUN 7.0 - 26.0 mg/dL 16.6 11 10   Creatinine 0.6 - 1.1 mg/dL 1.0 1.21(H) 0.95  Sodium 136 - 145 mEq/L 141 131(L) 139  Potassium 3.5 - 5.1 mEq/L 5.3(H) 4.0 4.1  Chloride 101 - 111 mmol/L - 97(L) 106  CO2 22 - 29 mEq/L 32(H) 25 28  Calcium 8.4 - 10.4 mg/dL 10.4 8.9 9.1   Total Protein 6.4 - 8.3 g/dL 7.3 - 6.6  Total Bilirubin 0.20 - 1.20 mg/dL 0.46 - 0.5  Alkaline Phos 40 - 150 U/L 71 - 61  AST 5 - 34 U/L 22 - 23  ALT 0 - 55 U/L 18 - 15     RADIOGRAPHIC STUDIES: I have personally reviewed the radiological images as listed and agreed with the findings in the report. Dg Chest 2 View  Result Date: 12/21/2016 CLINICAL DATA:  76 year old female with a history of sore throat and body aches with cough EXAM: CHEST  2 VIEW COMPARISON:  06/14/2015, 05/23/2015 FINDINGS: Cardiomediastinal silhouette unchanged in size and contour. Calcifications of the aortic arch. No evidence of central vascular congestion. No pleural effusion or pneumothorax. Chronic coarsened interstitial markings. No confluent airspace disease. No displaced fracture. IMPRESSION: No radiographic evidence of acute cardiopulmonary disease. Electronically Signed   By: Corrie Mckusick D.O.   On: 12/21/2016 13:32    ASSESSMENT & PLAN:   76 year old [place] female with multiple medical comorbidities with  #1 remote history of DVT more than 30 years ago. Patient has limited recall on the details of her event which anticoagulant she was treated with and for how long. Does remember that she had varicose veins which were thought to be one of the concerns and also recollects that she was probably pregnant when she had the DVT. In that case a pregnancy could've been a trigger factor.  Patient has no family history of an inherited thrombophilia. She has had 8 pregnancies and all but the last one produced no issues with venous thromboembolism. She has not had any recurrent DVTs or PE for the last 30 years.  Plan -Overall picture is not very suggestive of an inherited thrombophilia. -Patient has several acquired risk factors including relative immobility due to her severe arthritis, also chronic inflammatory state such as rheumatoid arthritis etc. -No indication for long-term anticoagulation at this  time. -We'll rule out a factor V Leiden and prothrombin gene mutation as additional factors regarding DVT though less likely. -irrespective of  the findings of factor V Leiden or prothrombin gene mutation the patient would need similar DVT prophylaxis perioperatively.  -Would recommend perioperative prophylaxis with Lovenox. Would recommend prophylaxis with Xarelto 10mg  po daily on post-operative discharge from the hospital for 5-6 weeks post operatively. Patient could consider taking ASA 81mg po daily indefinitely is no other medical contraindications. - we recommended that she use compression socks daily to help with legs swelling and discomfort due to varicose veins.  - we shall f/u on her thrombophilia workup  #2 hyperkalemia  -likely from ARB's -Patient was called and recommended low potassium diet, increased hydration with water. -Follow-up with primary care physician for repeat chemistries in 2 weeks.  #3  Patient Active Problem List   Diagnosis Date Noted  . Bradycardia 07/27/2016  . Cough   . Essential hypertension 11/24/2014  . Gonalgia 05/13/2013  . Cellulitis 05/13/2013  . Arthritis, degenerative 05/13/2013  . Degenerative joint disease involving multiple joints 01/19/2013  . Rheumatoid arthritis ([place]) 01/19/2013  . Thyroid nodule 06/11/2012  . Thrombocytopenia ([place])   . Elevated total protein   . Hypercalcemia   . Cyst of thyroid 10/29/2011  . Hemorrhoids, internal 05/28/2011  . Acid reflux 02/14/2011  . BP (high blood pressure) 02/14/2011  . Avitaminosis D 02/13/2011  . Glaucoma 11/28/2010  . HYPERLIPIDEMIA 12/14/2009  . HEMORRHOIDS 12/14/2009  . DIVERTICULOSIS, COLON 12/14/2009  . HIP PAIN, CHRONIC 12/14/2009  . BACK PAIN, CHRONIC 12/14/2009  . FIBROMYALGIA 12/14/2009  . OSTEOPOROSIS 12/14/2009  . SLEEP APNEA 12/14/2009  . COLONIC POLYPS, ADENOMATOUS, HX OF 12/14/2009  . GASTRIC POLYP, HX OF 12/14/2009  . HIATAL HERNIA 10/04/2009  . DEPRESSION 09/29/2009  .  REFLUX ESOPHAGITIS 09/29/2009  . GERD 09/29/2009  . CONSTIPATION 09/29/2009  . DYSPHAGIA 09/29/2009   -continue f/u with her primary care physician for management of other medical comorbidities . - patient has been recommended a rheumatology evaluation by her orthopedic surgeons.  RTC with Dr Irene Limbo on an as needed basis. All of the patients questions were answered with apparent satisfaction. The patient knows to call the clinic with any problems, questions or concerns.  I spent 45 minutes counseling the patient face to face. The total time spent in the appointment was 60 minutes and more than 50% was on counseling and direct patient cares.    Sullivan Lone MD [place] AAHIVMS [facility] Hematology/Oncology Physician [facility]  (Office):       [phone] (Work cell):  [phone] (Fax):           [phone]  01/08/2017 11:25 AM

## 2017-01-08 NOTE — Telephone Encounter (Signed)
Patient sent back to lab and given avs report for today's visit. Per 3/27 los f/u as needed.

## 2017-01-09 ENCOUNTER — Telehealth: Payer: Self-pay | Admitting: *Deleted

## 2017-01-09 NOTE — Telephone Encounter (Signed)
-----   Message from Brunetta Genera, MD sent at 01/08/2017  9:48 PM EDT ----- Darden Dates , Plz let patient know that her potassium is elevated . This  Could be due to her losartan. She needs to increase water intake, decrease potassium intake and have she chemistry recheck with her PCP in 2 weeks. Thanks, GK

## 2017-01-09 NOTE — Telephone Encounter (Signed)
Per staff messaged. Informed patient and verbalized understanding.

## 2017-01-09 NOTE — Telephone Encounter (Signed)
-----   Message from Brunetta Genera, MD sent at 01/08/2017  9:48 PM EDT ----- [name] , Plz let patient know that her potassium is elevated . This  Could be due to her losartan. She needs to increase water intake, decrease potassium intake and have she chemistry recheck with her PCP in 2 weeks. Thanks, GK

## 2017-01-14 LAB — PROTHROMBIN GENE MUTATION

## 2017-01-14 LAB — FACTOR 5 LEIDEN

## 2017-01-28 DIAGNOSIS — I10 Essential (primary) hypertension: Secondary | ICD-10-CM | POA: Diagnosis not present

## 2017-02-04 DIAGNOSIS — E875 Hyperkalemia: Secondary | ICD-10-CM | POA: Diagnosis not present

## 2017-02-04 DIAGNOSIS — I10 Essential (primary) hypertension: Secondary | ICD-10-CM | POA: Diagnosis not present

## 2017-02-04 DIAGNOSIS — M549 Dorsalgia, unspecified: Secondary | ICD-10-CM | POA: Diagnosis not present

## 2017-02-04 DIAGNOSIS — K219 Gastro-esophageal reflux disease without esophagitis: Secondary | ICD-10-CM | POA: Diagnosis not present

## 2017-02-28 ENCOUNTER — Telehealth: Payer: Self-pay | Admitting: *Deleted

## 2017-02-28 NOTE — Telephone Encounter (Signed)
FORWARD/DEFERRED TO DR BERRY  LAST CARDIOLOGY VISIT --07/27/16  Request for surgical clearance:  1. What type of surgery is being performed? RIGHT: TKA- MEDIAL & LATERAL W / WO PATELLA RESURFACING  2. When is this surgery scheduled? AUGUST 27,2018  3. Are there any medications that need to be held prior to surgery and how long? N/A  4. Name of physician performing surgery? DR Gaynelle Arabian  5. What is your office phone and fax number? FAX   [place]  PHONE [phone]

## 2017-03-03 NOTE — Telephone Encounter (Signed)
Cleared for ortho surg at low risk

## 2017-03-04 NOTE — Telephone Encounter (Signed)
FAXED VIA EPIC AS REQUESTED

## 2017-04-29 DIAGNOSIS — I10 Essential (primary) hypertension: Secondary | ICD-10-CM | POA: Diagnosis not present

## 2017-04-29 DIAGNOSIS — M170 Bilateral primary osteoarthritis of knee: Secondary | ICD-10-CM | POA: Diagnosis not present

## 2017-04-29 DIAGNOSIS — J309 Allergic rhinitis, unspecified: Secondary | ICD-10-CM | POA: Diagnosis not present

## 2017-04-29 DIAGNOSIS — Z0000 Encounter for general adult medical examination without abnormal findings: Secondary | ICD-10-CM | POA: Diagnosis not present

## 2017-05-07 DIAGNOSIS — M1711 Unilateral primary osteoarthritis, right knee: Secondary | ICD-10-CM | POA: Diagnosis not present

## 2017-05-13 DIAGNOSIS — H401133 Primary open-angle glaucoma, bilateral, severe stage: Secondary | ICD-10-CM | POA: Diagnosis not present

## 2017-05-16 ENCOUNTER — Ambulatory Visit: Payer: Self-pay | Admitting: Orthopedic Surgery

## 2017-05-16 NOTE — H&P (Signed)
[name] DOB: 1940/12/05 Married / Language: English / Race: Black or African American Female Date of Admission:  06/10/2017 CC:  Left and Right Knee Pain History of Present Illness  The patient is a 76 year old female who comes in for a preoperative History and Physical. The patient is scheduled for a left total knee arthroplasty and right knee cortisone injection to be performed by Dr. Dione Plover. Aluisio, MD at [facility] on 06/10/2017. The patient is a 76 year old female who presented for follow up of their knees. The patient is being followed for their bilateral knee pain and osteoarthritis. They are now year(s) out from when symptoms began. Symptoms reported include: pain. The patient feels that they are doing poorly and report their pain level to be moderate to severe. The following medication has been used for pain control: Tylenol. The patient has reported symptom improvement with: ice while they have not gotten any relief of their symptoms with: bracing, cortisone injections or visco supplementation. Unfortunately, both her right knee and left knee are getting worse. She is having pain at all times now. Initially the left knee hurt to a lesser degree than the right. More recently, the left knee has become the more problematic knee. It is limiting what she can and cannot do. She wants to be more active with the knee, it is preventing her from doing so. She has stage now where cortisone and visco supplements have not worked. She would like a more permanent solution to her knee problem. She would like to proceed with surgery and wants to get the left knee replaced first. They have been treated conservatively in the past for the above stated problem and despite conservative measures, they continue to have progressive pain and severe functional limitations and dysfunction. They have failed non-operative management including home exercise, medications, bracing, and injections. It is felt that  they would benefit from undergoing total joint replacement. Risks and benefits of the procedure have been discussed with the patient and they elect to proceed with surgery. There are no active contraindications to surgery such as ongoing infection or rapidly progressive neurological disease.   Problem List/Past Medical Primary osteoarthritis of both knees (M17.0)  Mitral Valve Prolapse  Hemorrhoids   Allergies Adalat CC *CALCIUM CHANNEL BLOCKERS*  lethargy DEMEROL [11/17/2003]: NAPROSYN [11/03/2004]: Nausea, Vomiting. NSAIDS [12/13/2004]: Nausea, Vomiting. INCLUDING NAPROSYN AND ALEVE Aleve [11/03/2004]: Nausea, Vomiting.  Family History  Cancer   Social History  Post-Surgical Plans  Home With Caregiver, Home, Straight to Outpatient Therapy. Advance Directives  Current work status  Retired. Children  8 Marital status  Married.  Medication History  Tylenol (325MG  Tablet, 1 (one) Oral) Active. PriLOSEC (40MG  Capsule DR, Oral) Active. Losartan Potassium (25MG  Tablet, Oral) Active. FLUoxetine HCl (20MG  Capsule, Oral) Active. RaNITidine HCl (300MG  Tablet, Oral) Active. Montelukast Sodium (10MG  Tablet, Oral) Active. Rosuvastatin Calcium (40MG  Tablet, Oral) Active. Claritin (10MG  Capsule, Oral) Active. Vitamin D3 (10000UNIT Capsule, Oral) Active. Omega-3 Fish Oil (1200MG  Capsule, Oral) Active.  Past Surgical History  Leg Circulation Surgery  right Tubal Ligation   Review of Systems  General Not Present- Chills, Fatigue, Fever, Memory Loss, Night Sweats, Weight Gain and Weight Loss. Skin Not Present- Eczema, Hives, Itching, Lesions and Rash. HEENT Not Present- Dentures, Double Vision, Headache, Hearing Loss, Tinnitus and Visual Loss. Respiratory Not Present- Allergies, Chronic Cough, Coughing up blood, Shortness of breath at rest and Shortness of breath with exertion. Cardiovascular Not Present- Chest Pain, Difficulty Breathing Lying Down, Murmur,  Palpitations, Racing/skipping heartbeats and Swelling. Gastrointestinal Not Present- Abdominal Pain, Bloody Stool, Constipation, Diarrhea, Difficulty Swallowing, Heartburn, Jaundice, Loss of appetitie, Nausea and Vomiting. Female Genitourinary Not Present- Blood in Urine, Discharge, Flank Pain, Incontinence, Painful Urination, Urgency, Urinary frequency, Urinary Retention, Urinating at Night and Weak urinary stream. Musculoskeletal Present- Joint Pain. Not Present- Back Pain, Joint Swelling, Morning Stiffness, Muscle Pain, Muscle Weakness and Spasms. Neurological Not Present- Blackout spells, Difficulty with balance, Dizziness, Paralysis, Tremor and Weakness. Psychiatric Not Present- Insomnia.  Vitals Weight: 199 lb Height: 69in Weight was reported by patient. Height was reported by patient. Body Surface Area: 2.06 m Body Mass Index: 29.39 kg/m  Pulse: 68 (Regular)  BP: 124/62 (Sitting, Left Arm, Standard)    Physical Exam General Mental Status -Alert, cooperative and good historian. General Appearance-pleasant, Not in acute distress. Orientation-Oriented X3. Build & Nutrition-Well nourished and Well developed.  Head and Neck Head-normocephalic, atraumatic . Neck Global Assessment - supple, no bruit auscultated on the right, no bruit auscultated on the left.  Eye Vision-Wears corrective lenses. Pupil - Bilateral-Regular and Round. Motion - Bilateral-EOMI.  ENMT Note: Partial upper and lower dentures   Chest and Lung Exam Auscultation Breath sounds - clear at anterior chest wall and clear at posterior chest wall. Adventitious sounds - No Adventitious sounds.  Cardiovascular Auscultation Rhythm - Regular rate and rhythm. Heart Sounds - S1 WNL and S2 WNL. Murmurs & Other Heart Sounds - Auscultation of the heart reveals - No Murmurs.  Abdomen Palpation/Percussion Tenderness - Abdomen is non-tender to palpation. Rigidity (guarding) - Abdomen is  soft. Auscultation Auscultation of the abdomen reveals - Bowel sounds normal.  Female Genitourinary Note: Not done, not pertinent to present illness   Musculoskeletal Note: Well-developed female, alert and oriented, in no apparent distress. Evaluation of her hips show normal range of motion with no discomfort. Her right knee shows no effusion. Her range of motion right knee is 5 to 130. There is crepitus on range of motion, tenderness medial greater than lateral, no instability. Her left knee, 0 to 135. She has some slight tenderness medial greater than laterally, no instability.  RADIOGRAPHS Review AP both knees, lateral of the right, she does have near bone on bone change of patellofemoral compartment as well as medial joint space narrowing.   Assessment & Plan Primary osteoarthritis of both knees (M17.0)  Note:Surgical Plans: Left Total Knee Replacement and a Right Knee Cortisone Injection  Disposition: Home with Family, Straight to Outpatient Therapy at [facility] on 06/14/2017  PCP: Dr. Ernie Hew Cards: Dr. Gwenlyn Found - Patient has been seen preoperatively and felt to be stable for surgery.  IV TXA  Anesthesia Issues: None  Patient was instructed on what medications to stop prior to surgery.  Signed electronically by Joelene Millin, III PA-C

## 2017-05-16 NOTE — H&P (Signed)
[name] DOB: 1941/02/25 Married / Language: English / Race: Black or African American Female Date of Admission:  06/10/2017 CC:  Left and Right Knee Pain History of Present Illness  The patient is a 76 year old female who comes in for a preoperative History and Physical. The patient is scheduled for a left total knee arthroplasty and right knee cortisone injection to be performed by Dr. Dione Plover. Aluisio, MD at [facility] on 06/10/2017. The patient is a 76 year old female who presented for follow up of their knees. The patient is being followed for their bilateral knee pain and osteoarthritis. They are now year(s) out from when symptoms began. Symptoms reported include: pain. The patient feels that they are doing poorly and report their pain level to be moderate to severe. The following medication has been used for pain control: Tylenol. The patient has reported symptom improvement with: ice while they have not gotten any relief of their symptoms with: bracing, cortisone injections or visco supplementation. Unfortunately, both her right knee and left knee are getting worse. She is having pain at all times now. Initially the left knee hurt to a lesser degree than the right. More recently, the left knee has become the more problematic knee. It is limiting what she can and cannot do. She wants to be more active with the knee, it is preventing her from doing so. She has stage now where cortisone and visco supplements have not worked. She would like a more permanent solution to her knee problem. She would like to proceed with surgery and wants to get the left knee replaced first. They have been treated conservatively in the past for the above stated problem and despite conservative measures, they continue to have progressive pain and severe functional limitations and dysfunction. They have failed non-operative management including home exercise, medications, bracing, and injections. It is felt that  they would benefit from undergoing total joint replacement. Risks and benefits of the procedure have been discussed with the patient and they elect to proceed with surgery. There are no active contraindications to surgery such as ongoing infection or rapidly progressive neurological disease.   Problem List/Past Medical Primary osteoarthritis of both knees (M17.0)  Mitral Valve Prolapse  Hemorrhoids   Allergies Adalat CC *CALCIUM CHANNEL BLOCKERS*  lethargy DEMEROL [11/17/2003]: NAPROSYN [11/03/2004]: Nausea, Vomiting. NSAIDS [12/13/2004]: Nausea, Vomiting. INCLUDING NAPROSYN AND ALEVE Aleve [11/03/2004]: Nausea, Vomiting.  Family History  Cancer   Social History  Post-Surgical Plans  Home With Caregiver, Home, Straight to Outpatient Therapy. Advance Directives  Current work status  Retired. Children  8 Marital status  Married.  Medication History  Tylenol (325MG  Tablet, 1 (one) Oral) Active. PriLOSEC (40MG  Capsule DR, Oral) Active. Losartan Potassium (25MG  Tablet, Oral) Active. FLUoxetine HCl (20MG  Capsule, Oral) Active. RaNITidine HCl (300MG  Tablet, Oral) Active. Montelukast Sodium (10MG  Tablet, Oral) Active. Rosuvastatin Calcium (40MG  Tablet, Oral) Active. Claritin (10MG  Capsule, Oral) Active. Vitamin D3 (10000UNIT Capsule, Oral) Active. Omega-3 Fish Oil (1200MG  Capsule, Oral) Active.  Past Surgical History  Leg Circulation Surgery  right Tubal Ligation   Review of Systems  General Not Present- Chills, Fatigue, Fever, Memory Loss, Night Sweats, Weight Gain and Weight Loss. Skin Not Present- Eczema, Hives, Itching, Lesions and Rash. HEENT Not Present- Dentures, Double Vision, Headache, Hearing Loss, Tinnitus and Visual Loss. Respiratory Not Present- Allergies, Chronic Cough, Coughing up blood, Shortness of breath at rest and Shortness of breath with exertion. Cardiovascular Not Present- Chest Pain, Difficulty Breathing Lying Down,  Murmur, Palpitations, Racing/skipping heartbeats and Swelling. Gastrointestinal Not Present- Abdominal Pain, Bloody Stool, Constipation, Diarrhea, Difficulty Swallowing, Heartburn, Jaundice, Loss of appetitie, Nausea and Vomiting. Female Genitourinary Not Present- Blood in Urine, Discharge, Flank Pain, Incontinence, Painful Urination, Urgency, Urinary frequency, Urinary Retention, Urinating at Night and Weak urinary stream. Musculoskeletal Present- Joint Pain. Not Present- Back Pain, Joint Swelling, Morning Stiffness, Muscle Pain, Muscle Weakness and Spasms. Neurological Not Present- Blackout spells, Difficulty with balance, Dizziness, Paralysis, Tremor and Weakness. Psychiatric Not Present- Insomnia.  Vitals Weight: 199 lb Height: 69in Weight was reported by patient. Height was reported by patient. Body Surface Area: 2.06 m Body Mass Index: 29.39 kg/m  Pulse: 68 (Regular)  BP: 124/62 (Sitting, Left Arm, Standard)    Physical Exam General Mental Status -Alert, cooperative and good historian. General Appearance-pleasant, Not in acute distress. Orientation-Oriented X3. Build & Nutrition-Well nourished and Well developed.  Head and Neck Head-normocephalic, atraumatic . Neck Global Assessment - supple, no bruit auscultated on the right, no bruit auscultated on the left.  Eye Vision-Wears corrective lenses. Pupil - Bilateral-Regular and Round. Motion - Bilateral-EOMI.  ENMT Note: Partial upper and lower dentures   Chest and Lung Exam Auscultation Breath sounds - clear at anterior chest wall and clear at posterior chest wall. Adventitious sounds - No Adventitious sounds.  Cardiovascular Auscultation Rhythm - Regular rate and rhythm. Heart Sounds - S1 WNL and S2 WNL. Murmurs & Other Heart Sounds - Auscultation of the heart reveals - No Murmurs.  Abdomen Palpation/Percussion Tenderness - Abdomen is non-tender to palpation. Rigidity  (guarding) - Abdomen is soft. Auscultation Auscultation of the abdomen reveals - Bowel sounds normal.  Female Genitourinary Note: Not done, not pertinent to present illness   Musculoskeletal Note: Well-developed female, alert and oriented, in no apparent distress. Evaluation of her hips show normal range of motion with no discomfort. Her right knee shows no effusion. Her range of motion right knee is 5 to 130. There is crepitus on range of motion, tenderness medial greater than lateral, no instability. Her left knee, 0 to 135. She has some slight tenderness medial greater than laterally, no instability.  RADIOGRAPHS Review AP both knees, lateral of the right, she does have near bone on bone change of patellofemoral compartment as well as medial joint space narrowing.   Assessment & Plan Primary osteoarthritis of both knees (M17.0)  Note:Surgical Plans: Left Total Knee Replacement and a Right Knee Cortisone Injection  Disposition: Home with Family, Straight to Outpatient Therapy at [facility] on 06/14/2017  PCP: Dr. Ernie Hew Cards: Dr. Gwenlyn Found - Patient has been seen preoperatively and felt to be stable for surgery.  IV TXA  Anesthesia Issues: None  Patient was instructed on what medications to stop prior to surgery.  Signed electronically by Joelene Millin, III PA-C

## 2017-05-21 ENCOUNTER — Ambulatory Visit: Payer: Self-pay | Admitting: Orthopedic Surgery

## 2017-05-31 ENCOUNTER — Other Ambulatory Visit (HOSPITAL_COMMUNITY): Payer: Self-pay | Admitting: Emergency Medicine

## 2017-05-31 NOTE — Patient Instructions (Addendum)
[place]  05/31/2017   Your procedure is scheduled on: 06-10-17  Report to [facility] Main  Entrance   Follow signs to Short Stay on first floor at 515AM   Call this number if you have problems the morning of surgery  [phone]   Remember: ONLY 1 PERSON MAY GO WITH YOU TO SHORT STAY TO GET  READY MORNING OF [place].   Do not eat food or drink liquids :After Midnight.  Please bring your CPAP mask and tubing to the hospital. The device will be provided!   Take these medicines the morning of surgery with A SIP OF WATER: fluoxetine(prozac), rosuvastatin(crestor), omeprazole(prilosec), tylenol as needed, inhaler as needed (may bring to hospital)                                 You may not have any metal on your body including hair pins and              piercings  Do not wear jewelry, make-up, lotions, powders or perfumes, deodorant             Do not wear nail polish.  Do not shave  48 hours prior to surgery.         Do not bring valuables to the hospital. [facility].  Contacts, dentures or bridgework may not be worn into surgery.  Leave suitcase in the car. After surgery it may be brought to your room.               Please read over the following fact sheets you were given: _____________________________________________________________________             [facility] - Preparing for Surgery Before surgery, you can play an important role.  Because skin is not sterile, your skin needs to be as free of germs as possible.  You can reduce the number of germs on your skin by washing with CHG (chlorahexidine gluconate) soap before surgery.  CHG is an antiseptic cleaner which kills germs and bonds with the skin to continue killing germs even after washing. Please DO NOT use if you have an allergy to CHG or antibacterial soaps.  If your skin becomes reddened/irritated stop using the CHG and inform your nurse  when you arrive at Short Stay. Do not shave (including legs and underarms) for at least 48 hours prior to the first CHG shower.  You may shave your face/neck. Please follow these instructions carefully:  1.  Shower with CHG Soap the night before surgery and the  morning of Surgery.  2.  If you choose to wash your hair, wash your hair first as usual with your  normal  shampoo.  3.  After you shampoo, rinse your hair and body thoroughly to remove the  shampoo.                           4.  Use CHG as you would any other liquid soap.  You can apply chg directly  to the skin and wash                       Gently with  a scrungie or clean washcloth.  5.  Apply the CHG Soap to your body ONLY FROM THE NECK DOWN.   Do not use on face/ open                           Wound or open sores. Avoid contact with eyes, ears mouth and genitals (private parts).                       Wash face,  Genitals (private parts) with your normal soap.             6.  Wash thoroughly, paying special attention to the area where your surgery  will be performed.  7.  Thoroughly rinse your body with warm water from the neck down.  8.  DO NOT shower/wash with your normal soap after using and rinsing off  the CHG Soap.                9.  Pat yourself dry with a clean towel.            10.  Wear clean pajamas.            11.  Place clean sheets on your bed the night of your first shower and do not  sleep with pets. Day of Surgery : Do not apply any lotions/deodorants the morning of surgery.  Please wear clean clothes to the hospital/surgery center.  FAILURE TO FOLLOW THESE INSTRUCTIONS MAY RESULT IN THE CANCELLATION OF YOUR SURGERY PATIENT SIGNATURE_________________________________  NURSE SIGNATURE__________________________________  ________________________________________________________________________   [name]  An incentive spirometer is a tool that can help keep your lungs clear and active. This tool  measures how well you are filling your lungs with each breath. Taking long deep breaths may help reverse or decrease the chance of developing breathing (pulmonary) problems (especially infection) following:  A long period of time when you are unable to move or be active. BEFORE THE PROCEDURE   If the spirometer includes an indicator to show your best effort, your nurse or respiratory therapist will set it to a desired goal.  If possible, sit up straight or lean slightly forward. Try not to slouch.  Hold the incentive spirometer in an upright position. INSTRUCTIONS FOR USE  1. Sit on the edge of your bed if possible, or sit up as far as you can in bed or on a chair. 2. Hold the incentive spirometer in an upright position. 3. Breathe out normally. 4. Place the mouthpiece in your mouth and seal your lips tightly around it. 5. Breathe in slowly and as deeply as possible, raising the piston or the ball toward the top of the column. 6. Hold your breath for 3-5 seconds or for as long as possible. Allow the piston or ball to fall to the bottom of the column. 7. Remove the mouthpiece from your mouth and breathe out normally. 8. Rest for a few seconds and repeat Steps 1 through 7 at least 10 times every 1-2 hours when you are awake. Take your time and take a few normal breaths between deep breaths. 9. The spirometer may include an indicator to show your best effort. Use the indicator as a goal to work toward during each repetition. 10. After each set of 10 deep breaths, practice coughing to be sure your lungs are clear. If you have an incision (the cut made at the time of surgery), support your  incision when coughing by placing a pillow or rolled up towels firmly against it. Once you are able to get out of bed, walk around indoors and cough well. You may stop using the incentive spirometer when instructed by your caregiver.  RISKS AND COMPLICATIONS  Take your time so you do not get dizzy or  light-headed.  If you are in pain, you may need to take or ask for pain medication before doing incentive spirometry. It is harder to take a deep breath if you are having pain. AFTER USE  Rest and breathe slowly and easily.  It can be helpful to keep track of a log of your progress. Your caregiver can provide you with a simple table to help with this. If you are using the spirometer at home, follow these instructions: [place] IF:   You are having difficultly using the spirometer.  You have trouble using the spirometer as often as instructed.  Your pain medication is not giving enough relief while using the spirometer.  You develop fever of 100.5 F (38.1 C) or higher. SEEK IMMEDIATE MEDICAL CARE IF:   You cough up bloody sputum that had not been present before.  You develop fever of 102 F (38.9 C) or greater.  You develop worsening pain at or near the incision site. MAKE SURE YOU:   Understand these instructions.  Will watch your condition.  Will get help right away if you are not doing well or get worse. Document Released: 02/11/2007 Document Revised: 12/24/2011 Document Reviewed: 04/14/2007 ExitCare Patient Information 2014 ExitCare, [place].   ________________________________________________________________________  WHAT IS A BLOOD TRANSFUSION? Blood Transfusion Information  A transfusion is the replacement of blood or some of its parts. Blood is made up of multiple cells which provide different functions.  Red blood cells carry oxygen and are used for blood loss replacement.  White blood cells fight against infection.  Platelets control bleeding.  Plasma helps clot blood.  Other blood products are available for specialized needs, such as hemophilia or other clotting disorders. BEFORE THE TRANSFUSION  Who gives blood for transfusions?   Healthy volunteers who are fully evaluated to make sure their blood is safe. This is blood bank  blood. Transfusion therapy is the safest it has ever been in the practice of medicine. Before blood is taken from a donor, a complete history is taken to make sure that person has no history of diseases nor engages in risky social behavior (examples are intravenous drug use or sexual activity with multiple partners). The donor's travel history is screened to minimize risk of transmitting infections, such as malaria. The donated blood is tested for signs of infectious diseases, such as HIV and hepatitis. The blood is then tested to be sure it is compatible with you in order to minimize the chance of a transfusion reaction. If you or a relative donates blood, this is often done in anticipation of surgery and is not appropriate for emergency situations. It takes many days to process the donated blood. RISKS AND COMPLICATIONS Although transfusion therapy is very safe and saves many lives, the main dangers of transfusion include:   Getting an infectious disease.  Developing a transfusion reaction. This is an allergic reaction to something in the blood you were given. Every precaution is taken to prevent this. The decision to have a blood transfusion has been considered carefully by your caregiver before blood is given. Blood is not given unless the benefits outweigh the risks. AFTER THE TRANSFUSION  Right  after receiving a blood transfusion, you will usually feel much better and more energetic. This is especially true if your red blood cells have gotten low (anemic). The transfusion raises the level of the red blood cells which carry oxygen, and this usually causes an energy increase.  The nurse administering the transfusion will monitor you carefully for complications. HOME CARE INSTRUCTIONS  No special instructions are needed after a transfusion. You may find your energy is better. Speak with your caregiver about any limitations on activity for underlying diseases you may have. SEEK MEDICAL CARE IF:    Your condition is not improving after your transfusion.  You develop redness or irritation at the intravenous (IV) site. SEEK IMMEDIATE MEDICAL CARE IF:  Any of the following symptoms occur over the next 12 hours:  Shaking chills.  You have a temperature by mouth above 102 F (38.9 C), not controlled by medicine.  Chest, back, or muscle pain.  People around you feel you are not acting correctly or are confused.  Shortness of breath or difficulty breathing.  Dizziness and fainting.  You get a rash or develop hives.  You have a decrease in urine output.  Your urine turns a dark color or changes to pink, red, or brown. Any of the following symptoms occur over the next 10 days:  You have a temperature by mouth above 102 F (38.9 C), not controlled by medicine.  Shortness of breath.  Weakness after normal activity.  The white part of the eye turns yellow (jaundice).  You have a decrease in the amount of urine or are urinating less often.  Your urine turns a dark color or changes to pink, red, or brown. Document Released: 09/28/2000 Document Revised: 12/24/2011 Document Reviewed: 05/17/2008 [facility] Patient Information 2014 [place], [place].  _______________________________________________________________________

## 2017-05-31 NOTE — Progress Notes (Signed)
caridology clearance Dr Gwenlyn Found 03-03-17 on chart   EKG 12-21-16 epic   Stress TEST 06-01-15 epic    "The study is normal.  This is a low risk study. Normal myocardial perfusion without scar or ischemia."   LOV oncology Dr Irene Limbo (referred to r/o DVT] 01-08-17  "Would recommend perioperative prophylaxis with Lovenox. Would recommend prophylaxis with Xarelto 10mg  po daily on post-operative discharge from the hospital for 5-6 weeks post operatively. Patient could consider taking ASA 81mg po daily indefinitely is no other medical contraindications."   [facility] cardiology Dr Gwenlyn Found 07-27-16 epic

## 2017-06-03 ENCOUNTER — Encounter (HOSPITAL_COMMUNITY)
Admission: RE | Admit: 2017-06-03 | Discharge: 2017-06-03 | Disposition: A | Discharge status: Home / Self Care | Payer: Medicare Other | Source: Ambulatory Visit | Attending: Orthopedic Surgery | Admitting: Orthopedic Surgery

## 2017-06-03 ENCOUNTER — Encounter (HOSPITAL_COMMUNITY): Payer: Self-pay

## 2017-06-03 DIAGNOSIS — Z01812 Encounter for preprocedural laboratory examination: Secondary | ICD-10-CM | POA: Diagnosis not present

## 2017-06-03 DIAGNOSIS — M1712 Unilateral primary osteoarthritis, left knee: Secondary | ICD-10-CM | POA: Diagnosis not present

## 2017-06-03 HISTORY — DX: Bradycardia, unspecified: R00.1

## 2017-06-03 LAB — CBC
HCT: 39.4 % (ref 36.0–46.0)
HEMOGLOBIN: 12.9 g/dL (ref 12.0–15.0)
MCH: 30.9 pg (ref 26.0–34.0)
MCHC: 32.7 g/dL (ref 30.0–36.0)
MCV: 94.3 fL (ref 78.0–100.0)
PLATELETS: 151 10*3/uL (ref 150–400)
RBC: 4.18 MIL/uL (ref 3.87–5.11)
RDW: 12.8 % (ref 11.5–15.5)
WBC: 4.8 10*3/uL (ref 4.0–10.5)

## 2017-06-03 LAB — COMPREHENSIVE METABOLIC PANEL
ALBUMIN: 4.2 g/dL (ref 3.5–5.0)
ALK PHOS: 63 U/L (ref 38–126)
ALT: 17 U/L (ref 14–54)
AST: 25 U/L (ref 15–41)
Anion gap: 4 — ABNORMAL LOW (ref 5–15)
BUN: 13 mg/dL (ref 6–20)
CALCIUM: 9.6 mg/dL (ref 8.9–10.3)
CHLORIDE: 105 mmol/L (ref 101–111)
CO2: 30 mmol/L (ref 22–32)
CREATININE: 1.02 mg/dL — AB (ref 0.44–1.00)
GFR calc non Af Amer: 53 mL/min — ABNORMAL LOW (ref >60)
GLUCOSE: 95 mg/dL (ref 65–99)
Potassium: 4.4 mmol/L (ref 3.5–5.1)
SODIUM: 139 mmol/L (ref 135–145)
Total Bilirubin: 0.5 mg/dL (ref 0.3–1.2)
Total Protein: 7.2 g/dL (ref 6.5–8.1)

## 2017-06-03 LAB — APTT: aPTT: 28 seconds (ref 24–36)

## 2017-06-03 LAB — ABO/RH: ABO/RH(D): B POS

## 2017-06-03 LAB — SURGICAL PCR SCREEN
MRSA, PCR: NEGATIVE
STAPHYLOCOCCUS AUREUS: NEGATIVE

## 2017-06-03 LAB — PROTIME-INR
INR: 1.04
Prothrombin Time: 13.6 seconds (ref 11.4–15.2)

## 2017-06-10 ENCOUNTER — Encounter (HOSPITAL_COMMUNITY)
Admission: RE | Disposition: A | Discharge status: Home / Self Care | Payer: Self-pay | Source: Ambulatory Visit | Attending: Orthopedic Surgery

## 2017-06-10 ENCOUNTER — Encounter (HOSPITAL_COMMUNITY): Payer: Self-pay

## 2017-06-10 ENCOUNTER — Inpatient Hospital Stay (HOSPITAL_COMMUNITY): Payer: Medicare Other | Admitting: Certified Registered Nurse Anesthetist

## 2017-06-10 ENCOUNTER — Inpatient Hospital Stay (HOSPITAL_COMMUNITY)
Admission: RE | Admit: 2017-06-10 | Discharge: 2017-06-12 | DRG: 470 | Disposition: A | Discharge status: Home / Self Care | Payer: Medicare Other | Source: Ambulatory Visit | Attending: Orthopedic Surgery | Admitting: Orthopedic Surgery

## 2017-06-10 DIAGNOSIS — M1712 Unilateral primary osteoarthritis, left knee: Principal | ICD-10-CM | POA: Diagnosis present

## 2017-06-10 DIAGNOSIS — I10 Essential (primary) hypertension: Secondary | ICD-10-CM | POA: Diagnosis present

## 2017-06-10 DIAGNOSIS — M810 Age-related osteoporosis without current pathological fracture: Secondary | ICD-10-CM | POA: Diagnosis present

## 2017-06-10 DIAGNOSIS — I341 Nonrheumatic mitral (valve) prolapse: Secondary | ICD-10-CM | POA: Diagnosis present

## 2017-06-10 DIAGNOSIS — M25562 Pain in left knee: Secondary | ICD-10-CM | POA: Diagnosis not present

## 2017-06-10 DIAGNOSIS — G4730 Sleep apnea, unspecified: Secondary | ICD-10-CM | POA: Diagnosis present

## 2017-06-10 DIAGNOSIS — H409 Unspecified glaucoma: Secondary | ICD-10-CM | POA: Diagnosis present

## 2017-06-10 DIAGNOSIS — M179 Osteoarthritis of knee, unspecified: Secondary | ICD-10-CM | POA: Diagnosis present

## 2017-06-10 DIAGNOSIS — K219 Gastro-esophageal reflux disease without esophagitis: Secondary | ICD-10-CM | POA: Diagnosis present

## 2017-06-10 DIAGNOSIS — K210 Gastro-esophageal reflux disease with esophagitis: Secondary | ICD-10-CM | POA: Diagnosis not present

## 2017-06-10 DIAGNOSIS — G8918 Other acute postprocedural pain: Secondary | ICD-10-CM | POA: Diagnosis not present

## 2017-06-10 DIAGNOSIS — E785 Hyperlipidemia, unspecified: Secondary | ICD-10-CM | POA: Diagnosis present

## 2017-06-10 DIAGNOSIS — M1710 Unilateral primary osteoarthritis, unspecified knee: Secondary | ICD-10-CM

## 2017-06-10 DIAGNOSIS — M797 Fibromyalgia: Secondary | ICD-10-CM | POA: Diagnosis present

## 2017-06-10 HISTORY — PX: TOTAL KNEE ARTHROPLASTY: SHX125

## 2017-06-10 LAB — COMPREHENSIVE METABOLIC PANEL
ALK PHOS: 66 U/L (ref 38–126)
ALT: 18 U/L (ref 14–54)
AST: 26 U/L (ref 15–41)
Albumin: 3.9 g/dL (ref 3.5–5.0)
Anion gap: 4 — ABNORMAL LOW (ref 5–15)
BUN: 16 mg/dL (ref 6–20)
CALCIUM: 9.2 mg/dL (ref 8.9–10.3)
CHLORIDE: 106 mmol/L (ref 101–111)
CO2: 30 mmol/L (ref 22–32)
CREATININE: 0.89 mg/dL (ref 0.44–1.00)
GFR calc Af Amer: >60 mL/min (ref >60)
Glucose, Bld: 125 mg/dL — ABNORMAL HIGH (ref 65–99)
Potassium: 4.4 mmol/L (ref 3.5–5.1)
Sodium: 140 mmol/L (ref 135–145)
Total Bilirubin: 0.3 mg/dL (ref 0.3–1.2)
Total Protein: 7 g/dL (ref 6.5–8.1)

## 2017-06-10 LAB — TYPE AND SCREEN
ABO/RH(D): B POS
Antibody Screen: NEGATIVE

## 2017-06-10 LAB — CBC
HCT: 37.9 % (ref 36.0–46.0)
HEMOGLOBIN: 12.4 g/dL (ref 12.0–15.0)
MCH: 30.8 pg (ref 26.0–34.0)
MCHC: 32.7 g/dL (ref 30.0–36.0)
MCV: 94 fL (ref 78.0–100.0)
PLATELETS: 139 10*3/uL — AB (ref 150–400)
RBC: 4.03 MIL/uL (ref 3.87–5.11)
RDW: 12.7 % (ref 11.5–15.5)
WBC: 5 10*3/uL (ref 4.0–10.5)

## 2017-06-10 LAB — PROTIME-INR
INR: 1.01
PROTHROMBIN TIME: 13.4 s (ref 11.4–15.2)

## 2017-06-10 LAB — APTT: aPTT: 28 seconds (ref 24–36)

## 2017-06-10 SURGERY — ARTHROPLASTY, KNEE, TOTAL
Anesthesia: Spinal | Site: Knee | Laterality: Left

## 2017-06-10 MED ORDER — ROPIVACAINE HCL 7.5 MG/ML IJ SOLN
INTRAMUSCULAR | Status: DC | PRN
  Administered 2017-06-10: 20 mL via PERINEURAL

## 2017-06-10 MED ORDER — LATANOPROST 0.005 % OP SOLN
1.0000 [drp] | Freq: Every day | OPHTHALMIC | Status: DC
  Filled 2017-06-10: qty 2.5

## 2017-06-10 MED ORDER — FENTANYL CITRATE (PF) 100 MCG/2ML IJ SOLN
INTRAMUSCULAR | Status: DC | PRN
Start: 2017-06-10 — End: 2017-06-10
  Administered 2017-06-10 (×2): 50 ug via INTRAVENOUS

## 2017-06-10 MED ORDER — EPHEDRINE SULFATE 50 MG/ML IJ SOLN
INTRAMUSCULAR | Status: DC | PRN
  Administered 2017-06-10 (×3): 5 mg via INTRAVENOUS

## 2017-06-10 MED ORDER — PROPOFOL 10 MG/ML IV BOLUS
INTRAVENOUS | Status: AC
  Filled 2017-06-10: qty 40

## 2017-06-10 MED ORDER — TRANEXAMIC ACID 1000 MG/10ML IV SOLN
1000.0000 mg | Freq: Once | INTRAVENOUS | Status: AC
  Administered 2017-06-10: 12:00:00 1000 mg via INTRAVENOUS
  Filled 2017-06-10: qty 1100

## 2017-06-10 MED ORDER — PHENOL 1.4 % MT LIQD: 1.0000 | OROMUCOSAL | Status: DC | PRN

## 2017-06-10 MED ORDER — FLUOXETINE HCL 20 MG PO TABS
20.0000 mg | ORAL_TABLET | Freq: Every day | ORAL | Status: DC
  Filled 2017-06-10: qty 1

## 2017-06-10 MED ORDER — POLYETHYLENE GLYCOL 3350 17 G PO PACK: 17.0000 g | PACK | Freq: Every day | ORAL | Status: DC | PRN

## 2017-06-10 MED ORDER — ALBUTEROL SULFATE (2.5 MG/3ML) 0.083% IN NEBU: 3.0000 mL | INHALATION_SOLUTION | Freq: Four times a day (QID) | RESPIRATORY_TRACT | Status: DC | PRN

## 2017-06-10 MED ORDER — FENTANYL CITRATE (PF) 100 MCG/2ML IJ SOLN
INTRAMUSCULAR | Status: AC
  Filled 2017-06-10: qty 2

## 2017-06-10 MED ORDER — SODIUM CHLORIDE 0.9 % IV SOLN
INTRAVENOUS | Status: DC
  Administered 2017-06-10 – 2017-06-11 (×3): via INTRAVENOUS

## 2017-06-10 MED ORDER — SODIUM CHLORIDE 0.9 % IJ SOLN
INTRAMUSCULAR | Status: AC
Start: 2017-06-10 — End: 2017-06-10
  Filled 2017-06-10: qty 10

## 2017-06-10 MED ORDER — MONTELUKAST SODIUM 10 MG PO TABS
10.0000 mg | ORAL_TABLET | Freq: Every day | ORAL | Status: DC
  Administered 2017-06-10 – 2017-06-11 (×2): 10 mg via ORAL
  Filled 2017-06-10 (×2): qty 1

## 2017-06-10 MED ORDER — ONDANSETRON HCL 4 MG/2ML IJ SOLN
4.0000 mg | Freq: Four times a day (QID) | INTRAMUSCULAR | Status: DC | PRN
  Administered 2017-06-11: 4 mg via INTRAVENOUS
  Filled 2017-06-10: qty 2

## 2017-06-10 MED ORDER — ONDANSETRON HCL 4 MG/2ML IJ SOLN
INTRAMUSCULAR | Status: AC
  Filled 2017-06-10: qty 2

## 2017-06-10 MED ORDER — ACETAMINOPHEN 10 MG/ML IV SOLN
INTRAVENOUS | Status: AC
  Filled 2017-06-10: qty 100

## 2017-06-10 MED ORDER — PHENYLEPHRINE HCL 10 MG/ML IJ SOLN
INTRAMUSCULAR | Status: DC | PRN
  Administered 2017-06-10 (×2): 80 ug via INTRAVENOUS
  Administered 2017-06-10 (×2): 40 ug via INTRAVENOUS
  Administered 2017-06-10: 80 ug via INTRAVENOUS
  Administered 2017-06-10 (×2): 40 ug via INTRAVENOUS

## 2017-06-10 MED ORDER — METHOCARBAMOL 500 MG PO TABS
500.0000 mg | ORAL_TABLET | Freq: Four times a day (QID) | ORAL | Status: DC | PRN
  Administered 2017-06-10 – 2017-06-12 (×4): 500 mg via ORAL
  Filled 2017-06-10 (×4): qty 1

## 2017-06-10 MED ORDER — ONDANSETRON HCL 4 MG PO TABS: 4.0000 mg | ORAL_TABLET | Freq: Four times a day (QID) | ORAL | Status: DC | PRN

## 2017-06-10 MED ORDER — ACETAMINOPHEN 10 MG/ML IV SOLN
1000.0000 mg | Freq: Once | INTRAVENOUS | Status: AC
  Administered 2017-06-10: 1000 mg via INTRAVENOUS

## 2017-06-10 MED ORDER — BISACODYL 10 MG RE SUPP: 10.0000 mg | Freq: Every day | RECTAL | Status: DC | PRN

## 2017-06-10 MED ORDER — LORATADINE 10 MG PO TABS
10.0000 mg | ORAL_TABLET | Freq: Every day | ORAL | Status: DC
  Administered 2017-06-10 – 2017-06-11 (×2): 10 mg via ORAL
  Filled 2017-06-10 (×2): qty 1

## 2017-06-10 MED ORDER — DIPHENHYDRAMINE HCL 12.5 MG/5ML PO ELIX: 12.5000 mg | ORAL_SOLUTION | ORAL | Status: DC | PRN

## 2017-06-10 MED ORDER — BUPIVACAINE LIPOSOME 1.3 % IJ SUSP
20.0000 mL | Freq: Once | INTRAMUSCULAR | Status: DC
  Filled 2017-06-10: qty 20

## 2017-06-10 MED ORDER — CHLORHEXIDINE GLUCONATE 4 % EX LIQD: 60.0000 mL | Freq: Once | CUTANEOUS | Status: DC

## 2017-06-10 MED ORDER — EPHEDRINE 5 MG/ML INJ
INTRAVENOUS | Status: AC
  Filled 2017-06-10: qty 10

## 2017-06-10 MED ORDER — FLEET ENEMA 7-19 GM/118ML RE ENEM: 1.0000 | ENEMA | Freq: Once | RECTAL | Status: DC | PRN

## 2017-06-10 MED ORDER — RIVAROXABAN 10 MG PO TABS
10.0000 mg | ORAL_TABLET | Freq: Every day | ORAL | Status: DC
  Administered 2017-06-11 – 2017-06-12 (×2): 10 mg via ORAL
  Filled 2017-06-10 (×2): qty 1

## 2017-06-10 MED ORDER — LOSARTAN POTASSIUM 50 MG PO TABS
50.0000 mg | ORAL_TABLET | Freq: Every day | ORAL | Status: DC
  Administered 2017-06-11 – 2017-06-12 (×2): 50 mg via ORAL
  Filled 2017-06-10 (×2): qty 1

## 2017-06-10 MED ORDER — CEFAZOLIN SODIUM-DEXTROSE 2-4 GM/100ML-% IV SOLN
2.0000 g | INTRAVENOUS | Status: AC
  Administered 2017-06-10: 2 g via INTRAVENOUS

## 2017-06-10 MED ORDER — LACTATED RINGERS IV SOLN
INTRAVENOUS | Status: DC
  Administered 2017-06-10: 1000 mL via INTRAVENOUS
  Administered 2017-06-10: 08:00:00 via INTRAVENOUS

## 2017-06-10 MED ORDER — METOCLOPRAMIDE HCL 5 MG PO TABS
5.0000 mg | ORAL_TABLET | Freq: Three times a day (TID) | ORAL | Status: DC | PRN
  Administered 2017-06-11: 10 mg via ORAL
  Filled 2017-06-10: qty 2

## 2017-06-10 MED ORDER — TRANEXAMIC ACID 1000 MG/10ML IV SOLN
1000.0000 mg | INTRAVENOUS | Status: AC
  Administered 2017-06-10: 07:00:00 via INTRAVENOUS
  Administered 2017-06-10: 1000 mg via INTRAVENOUS
  Filled 2017-06-10: qty 1100

## 2017-06-10 MED ORDER — HYDROMORPHONE HCL-NACL 0.5-0.9 MG/ML-% IV SOSY
0.5000 mg | PREFILLED_SYRINGE | INTRAVENOUS | Status: DC | PRN
  Administered 2017-06-10 – 2017-06-11 (×3): 0.5 mg via INTRAVENOUS
  Filled 2017-06-10 (×3): qty 1

## 2017-06-10 MED ORDER — MENTHOL 3 MG MT LOZG: 1.0000 | LOZENGE | OROMUCOSAL | Status: DC | PRN

## 2017-06-10 MED ORDER — SODIUM CHLORIDE 0.9 % IJ SOLN
INTRAMUSCULAR | Status: DC | PRN
  Administered 2017-06-10 (×2): 30 mL

## 2017-06-10 MED ORDER — MIDAZOLAM HCL 2 MG/2ML IJ SOLN
INTRAMUSCULAR | Status: AC
  Filled 2017-06-10: qty 2

## 2017-06-10 MED ORDER — FENTANYL CITRATE (PF) 100 MCG/2ML IJ SOLN: 50.0000 ug | INTRAMUSCULAR | Status: DC | PRN

## 2017-06-10 MED ORDER — PROPOFOL 500 MG/50ML IV EMUL
INTRAVENOUS | Status: DC | PRN
  Administered 2017-06-10: 50 ug/kg/min via INTRAVENOUS

## 2017-06-10 MED ORDER — DEXAMETHASONE SODIUM PHOSPHATE 10 MG/ML IJ SOLN
10.0000 mg | Freq: Once | INTRAMUSCULAR | Status: AC
  Administered 2017-06-10: 10 mg via INTRAVENOUS

## 2017-06-10 MED ORDER — PROMETHAZINE HCL 25 MG/ML IJ SOLN: 6.2500 mg | INTRAMUSCULAR | Status: DC | PRN

## 2017-06-10 MED ORDER — 0.9 % SODIUM CHLORIDE (POUR BTL) OPTIME
TOPICAL | Status: DC | PRN
  Administered 2017-06-10: 1000 mL

## 2017-06-10 MED ORDER — PANTOPRAZOLE SODIUM 40 MG PO TBEC
80.0000 mg | DELAYED_RELEASE_TABLET | Freq: Every day | ORAL | Status: DC
  Administered 2017-06-11 – 2017-06-12 (×2): 80 mg via ORAL
  Filled 2017-06-10 (×3): qty 2

## 2017-06-10 MED ORDER — SODIUM CHLORIDE 0.9 % IJ SOLN
INTRAMUSCULAR | Status: AC
  Filled 2017-06-10: qty 50

## 2017-06-10 MED ORDER — GABAPENTIN 300 MG PO CAPS
300.0000 mg | ORAL_CAPSULE | Freq: Once | ORAL | Status: AC
  Administered 2017-06-10: 300 mg via ORAL

## 2017-06-10 MED ORDER — HYDROMORPHONE HCL-NACL 0.5-0.9 MG/ML-% IV SOSY: 0.2500 mg | PREFILLED_SYRINGE | INTRAVENOUS | Status: DC | PRN

## 2017-06-10 MED ORDER — STERILE WATER FOR IRRIGATION IR SOLN
Status: DC | PRN
  Administered 2017-06-10: 2000 mL

## 2017-06-10 MED ORDER — BUPIVACAINE IN DEXTROSE 0.75-8.25 % IT SOLN
INTRATHECAL | Status: DC | PRN
  Administered 2017-06-10: 1.8 mL via INTRATHECAL

## 2017-06-10 MED ORDER — CEFAZOLIN SODIUM-DEXTROSE 2-4 GM/100ML-% IV SOLN
INTRAVENOUS | Status: AC
  Filled 2017-06-10: qty 100

## 2017-06-10 MED ORDER — ACETAMINOPHEN 325 MG PO TABS: 650.0000 mg | ORAL_TABLET | Freq: Four times a day (QID) | ORAL | Status: DC | PRN

## 2017-06-10 MED ORDER — DEXAMETHASONE SODIUM PHOSPHATE 10 MG/ML IJ SOLN
INTRAMUSCULAR | Status: AC
  Filled 2017-06-10: qty 1

## 2017-06-10 MED ORDER — SODIUM CHLORIDE 0.9 % IR SOLN
Status: DC | PRN
Start: 2017-06-10 — End: 2017-06-10
  Administered 2017-06-10: 1000 mL

## 2017-06-10 MED ORDER — PHENYLEPHRINE 40 MCG/ML (10ML) SYRINGE FOR IV PUSH (FOR BLOOD PRESSURE SUPPORT)
PREFILLED_SYRINGE | INTRAVENOUS | Status: AC
  Filled 2017-06-10: qty 10

## 2017-06-10 MED ORDER — ACETAMINOPHEN 650 MG RE SUPP: 650.0000 mg | Freq: Four times a day (QID) | RECTAL | Status: DC | PRN

## 2017-06-10 MED ORDER — FAMOTIDINE 20 MG PO TABS
20.0000 mg | ORAL_TABLET | Freq: Every day | ORAL | Status: DC
  Administered 2017-06-10 – 2017-06-11 (×2): 20 mg via ORAL
  Filled 2017-06-10 (×2): qty 1

## 2017-06-10 MED ORDER — METOCLOPRAMIDE HCL 5 MG/ML IJ SOLN: 5.0000 mg | Freq: Three times a day (TID) | INTRAMUSCULAR | Status: DC | PRN

## 2017-06-10 MED ORDER — BUPIVACAINE LIPOSOME 1.3 % IJ SUSP
INTRAMUSCULAR | Status: DC | PRN
  Administered 2017-06-10 (×2): 10 mL

## 2017-06-10 MED ORDER — MIDAZOLAM HCL 2 MG/2ML IJ SOLN: 1.0000 mg | INTRAMUSCULAR | Status: DC | PRN

## 2017-06-10 MED ORDER — TRAMADOL HCL 50 MG PO TABS
50.0000 mg | ORAL_TABLET | Freq: Four times a day (QID) | ORAL | Status: DC | PRN
  Administered 2017-06-10 (×2): 50 mg via ORAL
  Administered 2017-06-11 (×2): 100 mg via ORAL
  Administered 2017-06-12 (×2): 50 mg via ORAL
  Filled 2017-06-10: qty 2
  Filled 2017-06-10 (×3): qty 1
  Filled 2017-06-10: qty 2
  Filled 2017-06-10: qty 1

## 2017-06-10 MED ORDER — DOCUSATE SODIUM 100 MG PO CAPS
100.0000 mg | ORAL_CAPSULE | Freq: Two times a day (BID) | ORAL | Status: DC
  Administered 2017-06-10 – 2017-06-12 (×5): 100 mg via ORAL
  Filled 2017-06-10 (×5): qty 1

## 2017-06-10 MED ORDER — CEFAZOLIN SODIUM-DEXTROSE 2-4 GM/100ML-% IV SOLN
2.0000 g | Freq: Four times a day (QID) | INTRAVENOUS | Status: AC
  Administered 2017-06-10 (×2): 2 g via INTRAVENOUS
  Filled 2017-06-10 (×2): qty 100

## 2017-06-10 MED ORDER — DEXAMETHASONE SODIUM PHOSPHATE 10 MG/ML IJ SOLN
10.0000 mg | Freq: Once | INTRAMUSCULAR | Status: AC
  Administered 2017-06-11: 10 mg via INTRAVENOUS
  Filled 2017-06-10: qty 1

## 2017-06-10 MED ORDER — ONDANSETRON HCL 4 MG/2ML IJ SOLN
INTRAMUSCULAR | Status: DC | PRN
  Administered 2017-06-10: 4 mg via INTRAVENOUS

## 2017-06-10 MED ORDER — ACETAMINOPHEN 500 MG PO TABS
1000.0000 mg | ORAL_TABLET | Freq: Four times a day (QID) | ORAL | Status: AC
  Administered 2017-06-10 – 2017-06-11 (×4): 1000 mg via ORAL
  Filled 2017-06-10 (×4): qty 2

## 2017-06-10 MED ORDER — METHOCARBAMOL 1000 MG/10ML IJ SOLN
500.0000 mg | Freq: Four times a day (QID) | INTRAVENOUS | Status: DC | PRN
  Administered 2017-06-10: 10:00:00 500 mg via INTRAVENOUS
  Filled 2017-06-10: qty 550

## 2017-06-10 MED ORDER — GABAPENTIN 300 MG PO CAPS
ORAL_CAPSULE | ORAL | Status: AC
  Administered 2017-06-10: 300 mg via ORAL
  Filled 2017-06-10: qty 1

## 2017-06-10 MED ORDER — HYDROMORPHONE HCL 2 MG PO TABS
2.0000 mg | ORAL_TABLET | ORAL | Status: DC | PRN
  Administered 2017-06-10 – 2017-06-12 (×6): 2 mg via ORAL
  Filled 2017-06-10 (×3): qty 1
  Filled 2017-06-10: qty 2
  Filled 2017-06-10: qty 1
  Filled 2017-06-10: qty 2

## 2017-06-10 MED ORDER — ROSUVASTATIN CALCIUM 20 MG PO TABS
40.0000 mg | ORAL_TABLET | Freq: Every day | ORAL | Status: DC
  Administered 2017-06-10 – 2017-06-11 (×2): 40 mg via ORAL
  Filled 2017-06-10 (×2): qty 2

## 2017-06-10 SURGICAL SUPPLY — 52 items
BAG DECANTER FOR FLEXI CONT (MISCELLANEOUS) ×3 IMPLANT
BAG SPEC THK2 15X12 ZIP CLS (MISCELLANEOUS) ×1
BAG ZIPLOCK 12X15 (MISCELLANEOUS) ×3 IMPLANT
BANDAGE ACE 6X5 VEL STRL LF (GAUZE/BANDAGES/DRESSINGS) ×3 IMPLANT
BLADE SAG 18X100X1.27 (BLADE) ×3 IMPLANT
BLADE SAW SGTL 11.0X1.19X90.0M (BLADE) ×3 IMPLANT
BOWL SMART MIX CTS (DISPOSABLE) ×3 IMPLANT
CAP KNEE TOTAL 3 SIGMA ×2 IMPLANT
CEMENT HV SMART SET (Cement) ×6 IMPLANT
CLOSURE WOUND 1/2 X4 (GAUZE/BANDAGES/DRESSINGS) ×2
COVER SURGICAL LIGHT HANDLE (MISCELLANEOUS) ×3 IMPLANT
CUFF TOURN SGL QUICK 34 (TOURNIQUET CUFF) ×3
CUFF TRNQT CYL 34X4X40X1 (TOURNIQUET CUFF) ×1 IMPLANT
DECANTER SPIKE VIAL GLASS SM (MISCELLANEOUS) ×3 IMPLANT
DRAPE U-SHAPE 47X51 STRL (DRAPES) ×3 IMPLANT
DRSG ADAPTIC 3X8 NADH LF (GAUZE/BANDAGES/DRESSINGS) ×3 IMPLANT
DRSG PAD ABDOMINAL 8X10 ST (GAUZE/BANDAGES/DRESSINGS) ×3 IMPLANT
DURAPREP 26ML APPLICATOR (WOUND CARE) ×3 IMPLANT
ELECT REM PT RETURN 15FT ADLT (MISCELLANEOUS) ×3 IMPLANT
EVACUATOR 1/8 PVC DRAIN (DRAIN) ×3 IMPLANT
GAUZE SPONGE 4X4 12PLY STRL (GAUZE/BANDAGES/DRESSINGS) ×3 IMPLANT
GLOVE BIO SURGEON STRL SZ8 (GLOVE) ×7 IMPLANT
GLOVE BIOGEL PI IND STRL 6.5 (GLOVE) IMPLANT
GLOVE BIOGEL PI IND STRL 7.0 (GLOVE) IMPLANT
GLOVE BIOGEL PI IND STRL 7.5 (GLOVE) IMPLANT
GLOVE BIOGEL PI IND STRL 8 (GLOVE) ×1 IMPLANT
GLOVE BIOGEL PI INDICATOR 6.5 (GLOVE) ×2
GLOVE BIOGEL PI INDICATOR 7.0 (GLOVE) ×4
GLOVE BIOGEL PI INDICATOR 7.5 (GLOVE) ×4
GLOVE BIOGEL PI INDICATOR 8 (GLOVE) ×4
GLOVE SURG SS PI 6.5 STRL IVOR (GLOVE) ×2 IMPLANT
GOWN STRL REUS W/TWL LRG LVL3 (GOWN DISPOSABLE) ×5 IMPLANT
GOWN STRL REUS W/TWL XL LVL3 (GOWN DISPOSABLE) ×4 IMPLANT
HANDPIECE INTERPULSE COAX TIP (DISPOSABLE) ×3
IMMOBILIZER KNEE 20 (SOFTGOODS) ×3
IMMOBILIZER KNEE 20 THIGH 36 (SOFTGOODS) ×1 IMPLANT
MANIFOLD NEPTUNE II (INSTRUMENTS) ×3 IMPLANT
PACK TOTAL KNEE CUSTOM (KITS) ×3 IMPLANT
PAD ABD 8X10 STRL (GAUZE/BANDAGES/DRESSINGS) ×2 IMPLANT
PADDING CAST COTTON 6X4 STRL (CAST SUPPLIES) ×9 IMPLANT
POSITIONER SURGICAL ARM (MISCELLANEOUS) ×3 IMPLANT
SET HNDPC FAN SPRY TIP SCT (DISPOSABLE) ×1 IMPLANT
STRIP CLOSURE SKIN 1/2X4 (GAUZE/BANDAGES/DRESSINGS) ×4 IMPLANT
SUT MNCRL AB 4-0 PS2 18 (SUTURE) ×3 IMPLANT
SUT STRATAFIX 0 PDS 27 VIOLET (SUTURE) ×3
SUT VIC AB 2-0 CT1 27 (SUTURE) ×9
SUT VIC AB 2-0 CT1 TAPERPNT 27 (SUTURE) ×3 IMPLANT
SUTURE STRATFX 0 PDS 27 VIOLET (SUTURE) ×1 IMPLANT
SYR 30ML LL (SYRINGE) ×6 IMPLANT
TRAY FOLEY CATH SILVER 14FR (SET/KITS/TRAYS/PACK) ×2 IMPLANT
WRAP KNEE MAXI GEL POST OP (GAUZE/BANDAGES/DRESSINGS) ×3 IMPLANT
YANKAUER SUCT BULB TIP 10FT TU (MISCELLANEOUS) ×3 IMPLANT

## 2017-06-10 NOTE — Anesthesia Procedure Notes (Signed)
Procedure Name: MAC Date/Time: 06/10/2017 7:11 AM Performed by: West Pugh Pre-anesthesia Checklist: Patient identified, Emergency Drugs available, Suction available, Patient being monitored and Timeout performed Patient Re-evaluated:Patient Re-evaluated prior to induction Oxygen Delivery Method: Simple face mask Placement Confirmation: CO2 detector,  breath sounds checked- equal and bilateral and positive ETCO2 Dental Injury: Teeth and Oropharynx as per pre-operative assessment

## 2017-06-10 NOTE — Anesthesia Procedure Notes (Addendum)
Anesthesia Regional Block: Adductor canal block   Pre-Anesthetic Checklist: ,, timeout performed, Correct Patient, Correct Site, Correct Laterality, Correct Procedure, Correct Position, site marked, Risks and benefits discussed,  Surgical consent,  Pre-op evaluation,  At surgeon's request and post-op pain management  Laterality: Left  Prep: chloraprep       Needles:  Injection technique: Single-shot  Needle Type: Echogenic Needle     Needle Length: 9cm      Additional Needles:   Procedures: ultrasound guided,,,,,,,,  Narrative:  Start time: 06/10/2017 6:40 AM End time: 06/10/2017 6:50 AM Injection made incrementally with aspirations every 5 mL.  Performed by: Personally  Anesthesiologist: Deepti Gunawan  Additional Notes: Patient tolerated the procedure well without complications

## 2017-06-10 NOTE — Anesthesia Procedure Notes (Signed)
Spinal  Patient location during procedure: OR Start time: 06/10/2017 7:10 AM End time: 06/10/2017 7:20 AM Staffing Anesthesiologist: Dhara Schepp, Iona Beard Performed: anesthesiologist  Preanesthetic Checklist Completed: patient identified, site marked, surgical consent, pre-op evaluation, timeout performed, IV checked, risks and benefits discussed and monitors and equipment checked Spinal Block Patient position: sitting Prep: Betadine Patient monitoring: heart rate, continuous pulse ox and blood pressure Injection technique: single-shot Needle Needle type: Spinocan  Needle gauge: 24 G Needle length: 9 cm Additional Notes Expiration date of kit checked and confirmed. Patient tolerated procedure well, without complications.

## 2017-06-10 NOTE — Progress Notes (Signed)
Pt states that she will self administer CPAP when ready for bed.  RT to monitor and assess as needed.  

## 2017-06-10 NOTE — Progress Notes (Signed)
Assisted Dr. Rose with left, ultrasound guided, adductor canal block. Side rails up, monitors on throughout procedure. See vital signs in flow sheet. Tolerated Procedure well.  

## 2017-06-10 NOTE — Addendum Note (Signed)
Addendum  created 06/10/17 0947 by Myrtie Soman, MD   Order list changed, Order sets accessed

## 2017-06-10 NOTE — Op Note (Signed)
OPERATIVE REPORT-TOTAL KNEE ARTHROPLASTY   Pre-operative diagnosis- Osteoarthritis  Left knee(s)  Post-operative diagnosis- Osteoarthritis Left knee(s)  Procedure-  Left  Total Knee Arthroplasty  Surgeon- Dione Plover. Marx Doig, MD  Assistant- Ardeen Jourdain, PA-C   Anesthesia-  Adductor canal block and spinal  EBL-* No blood loss amount entered *   Drains Hemovac  Tourniquet time- 31 minutes @ 161 mm Hg   Complications- None  Condition-PACU - hemodynamically stable.   Brief Clinical Note  [name] is a 76 y.o. year old female with end stage OA of her left knee with progressively worsening pain and dysfunction. She has constant pain, with activity and at rest and significant functional deficits with difficulties even with ADLs. She has had extensive non-op management including analgesics, injections of cortisone and viscosupplements, and home exercise program, but remains in significant pain with significant dysfunction. Radiographs show bone on bone arthritis medial and patellofemoral. She presents now for left Total Knee Arthroplasty.    Procedure in detail---   The patient is brought into the operating room and positioned supine on the operating table. After successful administration of  Adductor canal block and spinal,   a tourniquet is placed high on the  Left thigh(s) and the lower extremity is prepped and draped in the usual sterile fashion. Time out is performed by the operating team and then the  Left lower extremity is wrapped in Esmarch, knee flexed and the tourniquet inflated to 300 mmHg.       A midline incision is made with a ten blade through the subcutaneous tissue to the level of the extensor mechanism. A fresh blade is used to make a medial parapatellar arthrotomy. Soft tissue over the proximal medial tibia is subperiosteally elevated to the joint line with a knife and into the semimembranosus bursa with a Cobb elevator. Soft tissue over the proximal lateral tibia is  elevated with attention being paid to avoiding the patellar tendon on the tibial tubercle. The patella is everted, knee flexed 90 degrees and the ACL and PCL are removed. Findings are bone on bone patellofemoral with exposed bone medial and large osteophytes.        The drill is used to create a starting hole in the distal femur and the canal is thoroughly irrigated with sterile saline to remove the fatty contents. The 5 degree Left  valgus alignment guide is placed into the femoral canal and the distal femoral cutting block is pinned to remove 10 mm off the distal femur. Resection is made with an oscillating saw.      The tibia is subluxed forward and the menisci are removed. The extramedullary alignment guide is placed referencing proximally at the medial aspect of the tibial tubercle and distally along the second metatarsal axis and tibial crest. The block is pinned to remove 42mm off the more deficient medial  side. Resection is made with an oscillating saw. Size 4is the most appropriate size for the tibia and the proximal tibia is prepared with the modular drill and keel punch for that size.      The femoral sizing guide is placed and size 4 is most appropriate. Rotation is marked off the epicondylar axis and confirmed by creating a rectangular flexion gap at 90 degrees. The size 4 cutting block is pinned in this rotation and the anterior, posterior and chamfer cuts are made with the oscillating saw. The intercondylar block is then placed and that cut is made.      Trial size  4 tibial component, trial size 4 posterior stabilized femur and a 10  mm posterior stabilized rotating platform insert trial is placed. Full extension is achieved with excellent varus/valgus and anterior/posterior balance throughout full range of motion. The patella is everted and thickness measured to be 24  mm. Free hand resection is taken to 14 mm, a 38 template is placed, lug holes are drilled, trial patella is placed, and it tracks  normally. Osteophytes are removed off the posterior femur with the trial in place. All trials are removed and the cut bone surfaces prepared with pulsatile lavage. Cement is mixed and once ready for implantation, the size 4 tibial implant, size  4 posterior stabilized femoral component, and the size 38 patella are cemented in place and the patella is held with the clamp. The trial insert is placed and the knee held in full extension. The Exparel (20 ml mixed with 60 ml saline) is injected into the extensor mechanism, posterior capsule, medial and lateral gutters and subcutaneous tissues.  All extruded cement is removed and once the cement is hard the permanent 10 mm posterior stabilized rotating platform insert is placed into the tibial tray.      The wound is copiously irrigated with saline solution and the extensor mechanism closed over a hemovac drain with #1 V-loc suture. The tourniquet is released for a total tourniquet time of 31  minutes. Flexion against gravity is 140 degrees and the patella tracks normally. Subcutaneous tissue is closed with 2.0 vicryl and subcuticular with running 4.0 Monocryl. The incision is cleaned and dried and steri-strips and a bulky sterile dressing are applied. The limb is placed into a knee immobilizer and the patient is awakened and transported to recovery in stable condition.      Please note that a surgical assistant was a medical necessity for this procedure in order to perform it in a safe and expeditious manner. Surgical assistant was necessary to retract the ligaments and vital neurovascular structures to prevent injury to them and also necessary for proper positioning of the limb to allow for anatomic placement of the prosthesis.   Dione Plover Adaeze Better, MD    06/10/2017, 8:09 AM

## 2017-06-10 NOTE — Interval H&P Note (Signed)
History and Physical Interval Note:  06/10/2017 6:32 AM  [name]  has presented today for surgery, with the diagnosis of Osteoarthritis Left Knee  The various methods of treatment have been discussed with the patient and family. After consideration of risks, benefits and other options for treatment, the patient has consented to  Procedure(s): LEFT  TOTAL KNEE ARTHROPLASTY (Left) as a surgical intervention .  The patient's history has been reviewed, patient examined, no change in status, stable for surgery.  I have reviewed the patient's chart and labs.  Questions were answered to the patient's satisfaction.     [name]

## 2017-06-10 NOTE — Anesthesia Preprocedure Evaluation (Signed)
Anesthesia Evaluation  Patient identified by MRN, date of birth, ID band Patient awake    Reviewed: Allergy & Precautions, NPO status , Patient's Chart, lab work & pertinent test results  Airway Mallampati: II  TM Distance: >3 FB Neck ROM: Full    Dental no notable dental hx.    Pulmonary sleep apnea and Continuous Positive Airway Pressure Ventilation ,    Pulmonary exam normal breath sounds clear to auscultation       Cardiovascular hypertension, Normal cardiovascular exam Rhythm:Regular Rate:Normal     Neuro/Psych negative neurological ROS  negative psych ROS   GI/Hepatic Neg liver ROS, GERD  ,  Endo/Other  negative endocrine ROS  Renal/GU negative Renal ROS  negative genitourinary   Musculoskeletal  (+) Arthritis , Rheumatoid disorders,    Abdominal   Peds negative pediatric ROS (+)  Hematology negative hematology ROS (+)   Anesthesia Other Findings   Reproductive/Obstetrics negative OB ROS                             Anesthesia Physical Anesthesia Plan  ASA: III  Anesthesia Plan: Spinal   Post-op Pain Management:    Induction: Intravenous  PONV Risk Score and Plan: 1 and Ondansetron and Dexamethasone  Airway Management Planned: Simple Face Mask  Additional Equipment:   Intra-op Plan:   Post-operative Plan:   Informed Consent: I have reviewed the patients History and Physical, chart, labs and discussed the procedure including the risks, benefits and alternatives for the proposed anesthesia with the patient or authorized representative who has indicated his/her understanding and acceptance.   Dental advisory given  Plan Discussed with: CRNA and Surgeon  Anesthesia Plan Comments:         Anesthesia Quick Evaluation

## 2017-06-10 NOTE — Anesthesia Procedure Notes (Signed)
Anesthesia Procedure Image    

## 2017-06-10 NOTE — Anesthesia Postprocedure Evaluation (Signed)
Anesthesia Post Note  Patient: [name]  Procedure(s) Performed: Procedure(s) (LRB): LEFT  TOTAL KNEE ARTHROPLASTY (Left)     Patient location during evaluation: PACU Anesthesia Type: Spinal Level of consciousness: oriented and awake and alert Pain management: pain level controlled Vital Signs Assessment: post-procedure vital signs reviewed and stable Respiratory status: spontaneous breathing, respiratory function stable and patient connected to nasal cannula oxygen Cardiovascular status: blood pressure returned to baseline and stable Postop Assessment: no headache and no backache Anesthetic complications: no    Last Vitals:  Vitals:   06/10/17 0830 06/10/17 0845  BP: (!) 117/51 129/60  Pulse: 66 61  Resp: 12 16  Temp: (!) 36.3 C   SpO2: 100% 100%    Last Pain:  Vitals:   06/10/17 0830  TempSrc:   PainSc: (P) 0-No pain                 Gianfranco Araki S

## 2017-06-10 NOTE — Evaluation (Addendum)
Physical Therapy Evaluation Patient Details Name: [name] MRN: [id number] DOB: 27-Jun-1941 Today's Date: 06/10/2017   History of Present Illness  76 y.o. female admitted on 06/10/17 for elective L TKA.  Pt with significant PMH of HTN, hearing loss, fibromyalgia, chronic back pain, and bradycardia.  Clinical Impression  Pt is s/p TKA resulting in the deficits listed below (see PT Problem List).  Pt will benefit from acutePT to increase their independence and safety with mobility to allow discharge home safely at [facility] to supervision level with elderly husband.      Follow Up Recommendations Outpatient PT    Equipment Recommendations  None recommended by PT    Recommendations for Other Services       Precautions / Restrictions Precautions Precautions: Knee Precaution Booklet Issued: Yes (comment) Precaution Comments: knee exercise handout given Required Braces or Orthoses: Knee Immobilizer - Left Restrictions Weight Bearing Restrictions: Yes LLE Weight Bearing: Weight bearing as tolerated      Mobility  Bed Mobility Overal bed mobility: Needs Assistance Bed Mobility: Supine to Sit;Sit to Supine     Supine to sit: Min assist Sit to supine: Min assist   General bed mobility comments: assist with LE   Transfers Overall transfer level: Needs assistance Equipment used: Rolling walker (2 wheeled) Transfers: Sit to/from Stand Sit to Stand: Mod assist;From elevated surface         General transfer comment: pt is fairly tall, had to elevate the bed and give some assistance for lift off, also had to add 2 pillows in recliner to increase height of recliner   Ambulation/Gait Ambulation/Gait assistance: Min guard Ambulation Distance (Feet): 50 Feet Assistive device: Rolling walker (2 wheeled) Gait Pattern/deviations: Step-to pattern     General Gait Details: educated with RW step pattern for day one and hand placement for transfers  Stairs            Wheelchair  Mobility    Modified Rankin (Stroke Patients Only)       Balance                                             Pertinent Vitals/Pain Pain Assessment: 0-10 Pain Score: 3  Pain Location: L knee area Pain Descriptors / Indicators: Aching Pain Intervention(s): Limited activity within patient's tolerance;Monitored during session;Premedicated before session;Repositioned;Ice applied    Home Living Family/patient expects to be discharged to:: Private residence Living Arrangements: Spouse/significant other Available Help at Discharge: Family;Available 24 hours/day (supervision, likely not physical assist, dtr stated she will be able to help the first week) Type of Home: House Home Access: Stairs to enter Entrance Stairs-Rails: None Entrance Stairs-Number of Steps: 2 (were a little difficulty prior to having surgery. ) Home Layout: One level Home Equipment: Walker - 2 wheels;Bedside commode Additional Comments: daughter also available the first week    Prior Function Level of Independence: Needs assistance   Gait / Transfers Assistance Needed: used RW for ~ 1year PTA, adn sometimes a cane, but that was hard due to her arms being weak           Hand Dominance        Extremity/Trunk Assessment        Lower Extremity Assessment Lower Extremity Assessment: Generalized weakness;LLE deficits/detail (arms fatigues easily with RW ) LLE Deficits / Details: grossly 3-/5 for thigh secondary not able to perform SLR independently  yet. [place] 0-50 degrees flexion supine in bed.        Communication   Communication: No difficulties  Cognition Arousal/Alertness: Lethargic;Suspect due to medications Behavior During Therapy: [facility] for tasks assessed/performed Overall Cognitive Status: Within Functional Limits for tasks assessed                                        General Comments      Exercises Total Joint Exercises Ankle Circles/Pumps:  AROM;10 reps;Both;Supine Quad Sets: AROM;Left;Supine;10 reps Heel Slides: AAROM;Left;10 reps;Supine Straight Leg Raises: AAROM;Left;5 reps;Supine Goniometric ROM: 0-50   Assessment/Plan    PT Assessment Patient needs continued PT services  PT Problem List Decreased strength;Decreased range of motion;Decreased mobility       PT Treatment Interventions Gait training;Stair training;Functional mobility training;Therapeutic activities;Therapeutic exercise;Patient/family education    PT Goals (Current goals can be found in the Care Plan section)  Acute Rehab PT Goals Patient Stated Goal: I want to be able to move better  PT Goal Formulation: With patient Time For Goal Achievement: 06/17/17 Potential to Achieve Goals: Good    Frequency 7x /week   Barriers to discharge        Co-evaluation               AM-PAC PT "6 Clicks" Daily Activity  Outcome Measure Difficulty turning over in bed (including adjusting bedclothes, sheets and blankets)?: A Lot Difficulty moving from lying on back to sitting on the side of the bed? : A Lot Difficulty sitting down on and standing up from a chair with arms (e.g., wheelchair, bedside commode, etc,.)?: A Lot Help needed moving to and from a bed to chair (including a wheelchair)?: A Little Help needed walking in hospital room?: A Little Help needed climbing 3-5 steps with a railing? : A Lot 6 Click Score: 14    End of Session Equipment Utilized During Treatment: Gait belt Activity Tolerance: Patient tolerated treatment well Patient left: in chair;with call bell/phone within reach;with chair alarm set;with family/visitor present Nurse Communication: Mobility status PT Visit Diagnosis: Other abnormalities of gait and mobility (R26.89)    Time: 2800-3491 PT Time Calculation (min) (ACUTE ONLY): 39 min   Charges:   PT Evaluation $PT Eval Low Complexity: 1 Low PT Treatments $Gait Training: 8-22 mins $Therapeutic Exercise: 8-22 mins   PT  G Codes:        Clide Dales, PT Pager: [phone] 06/10/2017   Tereka Thorley, Gatha Mayer 06/10/2017, 9:05 PM

## 2017-06-10 NOTE — Transfer of Care (Signed)
Immediate Anesthesia Transfer of Care Note  Patient: [name]  Procedure(s) Performed: Procedure(s) with comments: LEFT  TOTAL KNEE ARTHROPLASTY (Left) - Adductor Block  Patient Location: PACU  Anesthesia Type:Spinal and MAC combined with regional for post-op pain  Level of Consciousness:  sedated, patient cooperative and responds to stimulation  Airway & Oxygen Therapy:Patient Spontanous Breathing and Patient connected to face mask oxgen  Post-op Assessment:  Report given to PACU RN and Post -op Vital signs reviewed and stable  Post vital signs:  Reviewed and stable  Last Vitals:  Vitals:   06/10/17 0626 06/10/17 0628  BP:    Pulse: (!) 51 (!) 51  Resp: (!) 7 15  Temp:    SpO2: 29% 937%    Complications: No apparent anesthesia complications

## 2017-06-10 NOTE — Discharge Instructions (Addendum)
° °Dr. Frank Aluisio °Total Joint Specialist °[facility] °[address] °[place], [place] [address] °[phone] ° °TOTAL KNEE REPLACEMENT POSTOPERATIVE DIRECTIONS ° °Knee Rehabilitation, Guidelines Following Surgery  °Results after knee surgery are often greatly improved when you follow the exercise, range of motion and muscle strengthening exercises prescribed by your doctor. Safety measures are also important to protect the knee from further injury. Any time any of these exercises cause you to have increased pain or swelling in your knee joint, decrease the amount until you are comfortable again and slowly increase them. If you have problems or questions, call your caregiver or physical therapist for advice.  ° °HOME CARE INSTRUCTIONS  °Remove items at home which could result in a fall. This includes throw rugs or furniture in walking pathways.  °· ICE to the affected knee every three hours for 30 minutes at a time and then as needed for pain and swelling.  Continue to use ice on the knee for pain and swelling from surgery. You may notice swelling that will progress down to the foot and ankle.  This is normal after surgery.  Elevate the leg when you are not up walking on it.   °· Continue to use the breathing machine which will help keep your temperature down.  It is common for your temperature to cycle up and down following surgery, especially at night when you are not up moving around and exerting yourself.  The breathing machine keeps your lungs expanded and your temperature down. °· Do not place pillow under knee, focus on keeping the knee straight while resting ° °DIET °You may resume your previous home diet once your are discharged from the hospital. ° °DRESSING / WOUND CARE / SHOWERING °You may shower 3 days after surgery, but keep the wounds dry during showering.  You may use an occlusive plastic wrap (Press'n Seal for example), NO SOAKING/SUBMERGING IN THE BATHTUB.  If the  bandage gets wet, change with a clean dry gauze.  If the incision gets wet, pat the wound dry with a clean towel. °You may start showering once you are discharged home but do not submerge the incision under water. Just pat the incision dry and apply a dry gauze dressing on daily. °Change the surgical dressing daily and reapply a dry dressing each time. ° °ACTIVITY °Walk with your walker as instructed. °Use walker as long as suggested by your caregivers. °Avoid periods of inactivity such as sitting longer than an hour when not asleep. This helps prevent blood clots.  °You may resume a sexual relationship in one month or when given the OK by your doctor.  °You may return to work once you are cleared by your doctor.  °Do not drive a car for 6 weeks or until released by you surgeon.  °Do not drive while taking narcotics. ° °WEIGHT BEARING °Weight bearing as tolerated with assist device (walker, cane, etc) as directed, use it as long as suggested by your surgeon or therapist, typically at least 4-6 weeks. ° °POSTOPERATIVE CONSTIPATION PROTOCOL °Constipation - defined medically as fewer than three stools per week and severe constipation as less than one stool per week. ° °One of the most common issues patients have following surgery is constipation.  Even if you have a regular bowel pattern at home, your normal regimen is likely to be disrupted due to multiple reasons following surgery.  Combination of anesthesia, postoperative narcotics, change in appetite and fluid intake all can affect your bowels.    In order to avoid complications following surgery, here are some recommendations in order to help you during your recovery period. ° °Colace (docusate) - Pick up an over-the-counter form of Colace or another stool softener and take twice a day as long as you are requiring postoperative pain medications.  Take with a full glass of water daily.  If you experience loose stools or diarrhea, hold the colace until you stool forms  back up.  If your symptoms do not get better within 1 week or if they get worse, check with your doctor. ° °Dulcolax (bisacodyl) - Pick up over-the-counter and take as directed by the product packaging as needed to assist with the movement of your bowels.  Take with a full glass of water.  Use this product as needed if not relieved by Colace only.  ° °MiraLax (polyethylene glycol) - Pick up over-the-counter to have on hand.  MiraLax is a solution that will increase the amount of water in your bowels to assist with bowel movements.  Take as directed and can mix with a glass of water, juice, soda, coffee, or tea.  Take if you go more than two days without a movement. °Do not use MiraLax more than once per day. Call your doctor if you are still constipated or irregular after using this medication for 7 days in a row. ° °If you continue to have problems with postoperative constipation, please contact the office for further assistance and recommendations.  If you experience "the worst abdominal pain ever" or develop nausea or vomiting, please contact the office immediatly for further recommendations for treatment. ° °ITCHING ° If you experience itching with your medications, try taking only a single pain pill, or even half a pain pill at a time.  You can also use Benadryl over the counter for itching or also to help with sleep.  ° °TED HOSE STOCKINGS °Wear the elastic stockings on both legs for three weeks following surgery during the day but you may remove then at night for sleeping. ° °MEDICATIONS °See your medication summary on the “After Visit Summary” that the nursing staff will review with you prior to discharge.  You may have some home medications which will be placed on hold until you complete the course of blood thinner medication.  It is important for you to complete the blood thinner medication as prescribed by your surgeon.  Continue your approved medications as instructed at time of  discharge. ° °PRECAUTIONS °If you experience chest pain or shortness of breath - call 911 immediately for transfer to the hospital emergency department.  °If you develop a fever greater that 101 F, purulent drainage from wound, increased redness or drainage from wound, foul odor from the wound/dressing, or calf pain - CONTACT YOUR SURGEON.   °                                                °FOLLOW-UP APPOINTMENTS °Make sure you keep all of your appointments after your operation with your surgeon and caregivers. You should call the office at the above phone number and make an appointment for approximately two weeks after the date of your surgery or on the date instructed by your surgeon outlined in the "After Visit Summary". ° ° °RANGE OF MOTION AND STRENGTHENING EXERCISES  °Rehabilitation of the knee is important following a knee injury or   an operation. After just a few days of immobilization, the muscles of the thigh which control the knee become weakened and shrink (atrophy). Knee exercises are designed to build up the tone and strength of the thigh muscles and to improve knee motion. Often times heat used for twenty to thirty minutes before working out will loosen up your tissues and help with improving the range of motion but do not use heat for the first two weeks following surgery. These exercises can be done on a training (exercise) mat, on the floor, on a table or on a bed. Use what ever works the best and is most comfortable for you Knee exercises include:  °Leg Lifts - While your knee is still immobilized in a splint or cast, you can do straight leg raises. Lift the leg to 60 degrees, hold for 3 sec, and slowly lower the leg. Repeat 10-20 times 2-3 times daily. Perform this exercise against resistance later as your knee gets better.  °Quad and Hamstring Sets - Tighten up the muscle on the front of the thigh (Quad) and hold for 5-10 sec. Repeat this 10-20 times hourly. Hamstring sets are done by pushing the  foot backward against an object and holding for 5-10 sec. Repeat as with quad sets.  °· Leg Slides: Lying on your back, slowly slide your foot toward your buttocks, bending your knee up off the floor (only go as far as is comfortable). Then slowly slide your foot back down until your leg is flat on the floor again. °· Angel Wings: Lying on your back spread your legs to the side as far apart as you can without causing discomfort.  °A rehabilitation program following serious knee injuries can speed recovery and prevent re-injury in the future due to weakened muscles. Contact your doctor or a physical therapist for more information on knee rehabilitation.  ° °IF YOU ARE TRANSFERRED TO A SKILLED REHAB FACILITY °If the patient is transferred to a skilled rehab facility following release from the hospital, a list of the current medications will be sent to the facility for the patient to continue.  When discharged from the skilled rehab facility, please have the facility set up the patient's Home Health Physical Therapy prior to being released. Also, the skilled facility will be responsible for providing the patient with their medications at time of release from the facility to include their pain medication, the muscle relaxants, and their blood thinner medication. If the patient is still at the rehab facility at time of the two week follow up appointment, the skilled rehab facility will also need to assist the patient in arranging follow up appointment in our office and any transportation needs. ° °MAKE SURE YOU:  °Understand these instructions.  °Get help right away if you are not doing well or get worse.  ° ° °Pick up stool softner and laxative for home use following surgery while on pain medications. °Do not submerge incision under water. °Please use good hand washing techniques while changing dressing each day. °May shower starting three days after surgery. °Please use a clean towel to pat the incision dry following  showers. °Continue to use ice for pain and swelling after surgery. °Do not use any lotions or creams on the incision until instructed by your surgeon. ° °Take Xarelto for two and a half more weeks following discharge from the hospital, then discontinue Xarelto. °Once the patient has completed the blood thinner regimen, then take a Baby 81 mg Aspirin daily for three   more weeks. ° ° ° °Information on my medicine - XARELTO® (Rivaroxaban) ° °This medication education was reviewed with me or my healthcare representative as part of my discharge preparation.   ° °Why was Xarelto® prescribed for you? °Xarelto® was prescribed for you to reduce the risk of blood clots forming after orthopedic surgery. The medical term for these abnormal blood clots is venous thromboembolism (VTE). ° °What do you need to know about xarelto® ? °Take your Xarelto® ONCE DAILY at the same time every day. °You may take it either with or without food. ° °If you have difficulty swallowing the tablet whole, you may crush it and mix in applesauce just prior to taking your dose. ° °Take Xarelto® exactly as prescribed by your doctor and DO NOT stop taking Xarelto® without talking to the doctor who prescribed the medication.  Stopping without other VTE prevention medication to take the place of Xarelto® may increase your risk of developing a clot. ° °After discharge, you should have regular check-up appointments with your healthcare provider that is prescribing your Xarelto®.   ° °What do you do if you miss a dose? °If you miss a dose, take it as soon as you remember on the same day then continue your regularly scheduled once daily regimen the next day. Do not take two doses of Xarelto® on the same day.  ° °Important Safety Information °A possible side effect of Xarelto® is bleeding. You should call your healthcare provider right away if you experience any of the following: °? Bleeding from an injury or your nose that does not stop. °? Unusual colored  urine (red or dark brown) or unusual colored stools (red or black). °? Unusual bruising for unknown reasons. °? A serious fall or if you hit your head (even if there is no bleeding). ° °Some medicines may interact with Xarelto® and might increase your risk of bleeding while on Xarelto®. To help avoid this, consult your healthcare provider or pharmacist prior to using any new prescription or non-prescription medications, including herbals, vitamins, non-steroidal anti-inflammatory drugs (NSAIDs) and supplements. ° °This website has more information on Xarelto®: www.xarelto.com. ° ° °

## 2017-06-11 LAB — CBC
HCT: 35.3 % — ABNORMAL LOW (ref 36.0–46.0)
Hemoglobin: 11.7 g/dL — ABNORMAL LOW (ref 12.0–15.0)
MCH: 30.5 pg (ref 26.0–34.0)
MCHC: 33.1 g/dL (ref 30.0–36.0)
MCV: 92.2 fL (ref 78.0–100.0)
PLATELETS: 148 10*3/uL — AB (ref 150–400)
RBC: 3.83 MIL/uL — ABNORMAL LOW (ref 3.87–5.11)
RDW: 12.7 % (ref 11.5–15.5)
WBC: 9.3 10*3/uL (ref 4.0–10.5)

## 2017-06-11 LAB — BASIC METABOLIC PANEL
Anion gap: 7 (ref 5–15)
BUN: 15 mg/dL (ref 6–20)
CALCIUM: 8.8 mg/dL — AB (ref 8.9–10.3)
CO2: 27 mmol/L (ref 22–32)
CREATININE: 0.98 mg/dL (ref 0.44–1.00)
Chloride: 101 mmol/L (ref 101–111)
GFR calc Af Amer: >60 mL/min (ref >60)
GFR, EST NON AFRICAN AMERICAN: 55 mL/min — AB (ref >60)
GLUCOSE: 134 mg/dL — AB (ref 65–99)
Potassium: 4.7 mmol/L (ref 3.5–5.1)
Sodium: 135 mmol/L (ref 135–145)

## 2017-06-11 MED ORDER — RIVAROXABAN 10 MG PO TABS: 10.0000 mg | ORAL_TABLET | Freq: Every day | ORAL | 0 refills | Status: DC

## 2017-06-11 MED ORDER — METHOCARBAMOL 500 MG PO TABS: 500.0000 mg | ORAL_TABLET | Freq: Four times a day (QID) | ORAL | 0 refills | Status: DC | PRN

## 2017-06-11 MED ORDER — FLUOXETINE HCL 20 MG PO CAPS
20.0000 mg | ORAL_CAPSULE | Freq: Every day | ORAL | Status: DC
  Administered 2017-06-11 – 2017-06-12 (×2): 20 mg via ORAL
  Filled 2017-06-11 (×2): qty 1

## 2017-06-11 MED ORDER — TRAMADOL HCL 50 MG PO TABS: 50.0000 mg | ORAL_TABLET | Freq: Four times a day (QID) | ORAL | 0 refills | Status: DC | PRN

## 2017-06-11 MED ORDER — HYDROMORPHONE HCL 2 MG PO TABS: 2.0000 mg | ORAL_TABLET | ORAL | 0 refills | Status: DC | PRN

## 2017-06-11 NOTE — Progress Notes (Signed)
Subjective: 1 Day Post-Op Procedure(s) (LRB): LEFT  TOTAL KNEE ARTHROPLASTY (Left) Patient reports pain as mild.   Patient seen in rounds for Dr. Wynelle Link.  Sitting up eating breakfast. Patient is well, but has had some minor complaints of pain in the knee, requiring pain medications We will resume therapy today.   She walked 50 feet yesterday with therapy. Plan is to go Home after hospital stay.  Objective: Vital signs in last 24 hours: Temp:  [97.3 F (36.3 C)-98.3 F (36.8 C)] 97.7 F (36.5 C) (08/28 0505) Pulse Rate:  [53-68] 68 (08/28 0505) Resp:  [14-18] 18 (08/28 0505) BP: (110-157)/(55-72) 132/58 (08/28 0505) SpO2:  [94 %-100 %] 97 % (08/28 0505)  Intake/Output from previous day:  Intake/Output Summary (Last 24 hours) at 06/11/17 0918 Last data filed at 06/11/17 0650  Gross per 24 hour  Intake           2867.5 ml  Output             4165 ml  Net          -1297.5 ml    Intake/Output this shift: No intake/output data recorded.  Labs:  Recent Labs  06/10/17 1019 06/11/17 0519  HGB 12.4 11.7*    Recent Labs  06/10/17 1019 06/11/17 0519  WBC 5.0 9.3  RBC 4.03 3.83*  HCT 37.9 35.3*  PLT 139* 148*    Recent Labs  06/10/17 1019 06/11/17 0519  NA 140 135  K 4.4 4.7  CL 106 101  CO2 30 27  BUN 16 15  CREATININE 0.89 0.98  GLUCOSE 125* 134*  CALCIUM 9.2 8.8*    Recent Labs  06/10/17 1019  INR 1.01    EXAM General - Patient is Alert, Appropriate and Oriented Extremity - Neurovascular intact Sensation intact distally Intact pulses distally Dorsiflexion/Plantar flexion intact Dressing - dressing C/D/I Motor Function - intact, moving foot and toes well on exam.  Hemovac pulled without difficulty.  Past Medical History:  Diagnosis Date  . Anal or rectal pain    sometimes  . Arthritis   . Bradycardia    " I KNOW I HAVE BRADYCARDIA ESPECIALLY WHEN I SLEEP"   . Chronic back pain   . Degenerative joint disease   . Depression   .  Diverticulosis 2003  . Elevated total protein   . Esophageal dysmotility   . Fibromyalgia   . GERD (gastroesophageal reflux disease)    subsequent Nissen Fundoplication  . Glaucoma   . Hearing loss   . Hemorrhoids   . Hiatal hernia 11/08/09  . Hx of adenomatous colonic polyps 07/02/02  . Hypercalcemia   . Hyperlipidemia   . Hyperlipidemia   . Hypertension   . Nausea   . Osteoporosis   . Rectal bleeding    from hemorrhoids.    . Sleep apnea    DOES USE CPAP   . Thrombocytopenia (HCC)     Assessment/Plan: 1 Day Post-Op Procedure(s) (LRB): LEFT  TOTAL KNEE ARTHROPLASTY (Left) Principal Problem:   OA (osteoarthritis) of knee  Estimated body mass index is 28.7 kg/m as calculated from the following:   Height as of this encounter: 5\' 10"  (1.778 m).   Weight as of this encounter: 90.7 kg (200 lb). Advance diet Up with therapy Plan for discharge tomorrow  Plan is to go straight to outpatient therapy.  DVT Prophylaxis - Xarelto Weight-Bearing as tolerated to left leg D/C O2 and Pulse OX and try on Room Air  Arlee Muslim,  PA-C Orthopaedic Surgery 06/11/2017, 9:18 AM

## 2017-06-11 NOTE — Evaluation (Signed)
Occupational Therapy Evaluation Patient Details Name: [name] MRN: [id number] DOB: 03-08-1941 Today's Date: 06/11/2017    History of Present Illness 76 y.o. female admitted on 06/10/17 for elective L TKA.  Pt with significant PMH of HTN, hearing loss, fibromyalgia, chronic back pain, and bradycardia.   Clinical Impression   Pt was admitted for the above sx. All education was completed. No further OT is needed at this time    Follow Up Recommendations  Supervision/Assistance - 24 hour    Equipment Recommendations  None recommended by OT    Recommendations for Other Services       Precautions / Restrictions Precautions Precautions: Knee Required Braces or Orthoses: Knee Immobilizer - Left Restrictions Weight Bearing Restrictions: Yes LLE Weight Bearing: Weight bearing as tolerated      Mobility Bed Mobility         Supine to sit: Min assist Sit to supine: Min assist   General bed mobility comments: assist with LE   Transfers   Equipment used: Rolling walker (2 wheeled)   Sit to Stand: Min assist;From elevated surface         General transfer comment: assist to rise and stabilize     Balance                                           ADL either performed or assessed with clinical judgement   ADL Overall ADL's : Needs assistance/impaired     Grooming: Supervision/safety;Standing   Upper Body Bathing: Set up;Sitting   Lower Body Bathing: Minimal assistance;Sit to/from stand   Upper Body Dressing : Set up;Sitting   Lower Body Dressing: Moderate assistance;Sit to/from stand   Toilet Transfer: Minimal assistance;Ambulation;BSC;RW   Toileting- Clothing Manipulation and Hygiene: Minimal assistance;Sit to/from stand         General ADL Comments: educated on knee precautions, sidestepping through tight area for safety.  Pt verbalizes understanding of tub bench and will sponge bathe if they can't find it. Educated on ways to self  assist LLE OOB     Vision         Perception     Praxis      Pertinent Vitals/Pain Pain Score: 4  Pain Location: L knee area Pain Descriptors / Indicators: Aching Pain Intervention(s): Limited activity within patient's tolerance;Premedicated before session;Monitored during session;Repositioned;Ice applied     Hand Dominance     Extremity/Trunk Assessment Upper Extremity Assessment Upper Extremity Assessment: Generalized weakness           Communication Communication Communication: No difficulties   Cognition Arousal/Alertness: Awake/alert Behavior During Therapy: WFL for tasks assessed/performed Overall Cognitive Status: Within Functional Limits for tasks assessed                                     General Comments       Exercises     Shoulder Instructions      Home Living Family/patient expects to be discharged to:: Private residence Living Arrangements: Spouse/significant other Available Help at Discharge: Family;Available 24 hours/day               Bathroom Shower/Tub: Teacher, early years/pre: Standard     Home Equipment: Environmental consultant - 2 wheels;Bedside commode   Additional Comments: may have tub bench in the shed:  husband will check.  Daughter assisting x 1 week then intermittently      Prior Functioning/Environment          Comments: used RW at baseline; mod i with adls        OT Problem List:        OT Treatment/Interventions:      OT Goals(Current goals can be found in the care plan section) Acute Rehab OT Goals Patient Stated Goal: I want to be able to move better  OT Goal Formulation: All assessment and education complete, DC therapy  OT Frequency:     Barriers to D/C:            Co-evaluation              AM-PAC PT "6 Clicks" Daily Activity     Outcome Measure Help from another person eating meals?: None Help from another person taking care of personal grooming?: A Little Help from  another person toileting, which includes using toliet, bedpan, or urinal?: A Little Help from another person bathing (including washing, rinsing, drying)?: A Little Help from another person to put on and taking off regular upper body clothing?: A Little Help from another person to put on and taking off regular lower body clothing?: A Lot 6 Click Score: 18   End of Session Equipment Utilized During Treatment: Back brace CPM Left Knee CPM Left Knee: Off  Activity Tolerance: Patient tolerated treatment well Patient left: in bed;with call bell/phone within reach;with family/visitor present  OT Visit Diagnosis: Pain Pain - Right/Left: Left Pain - part of body: Knee                Time: 0822-0852 OT Time Calculation (min): 30 min Charges:  OT General Charges $OT Visit: 1 Visit OT Evaluation $OT Eval Low Complexity: 1 Low OT Treatments $Self Care/Home Management : 8-22 mins G-Codes:     [place], OTR/L [phone] 06/11/2017  Aradhana Gin 06/11/2017, 9:04 AM

## 2017-06-11 NOTE — Progress Notes (Signed)
Physical Therapy Treatment Patient Details Name: [name] MRN: [id number] DOB: [birth year] Today's Date: 06/11/2017    History of Present Illness 76 y.o. female admitted on 06/10/17 for elective L TKA.  Pt with significant PMH of HTN, hearing loss, fibromyalgia, chronic back pain, and bradycardia.    PT Comments    Pt reports more thigh pain this afternoon and slower to mobilize however tolerated improved distance well.  Pt on O2 [place] upon entering room.  SPO2 at rest 96% room air and 94% room air after ambulating (did not reapply O2 [place] and RN aware).  Pt denies any dizziness with mobility this session.   Follow Up Recommendations  Outpatient PT     Equipment Recommendations  None recommended by PT    Recommendations for Other Services       Precautions / Restrictions Precautions Precautions: Knee Required Braces or Orthoses: Knee Immobilizer - Left Restrictions LLE Weight Bearing: Weight bearing as tolerated    Mobility  Bed Mobility Overal bed mobility: Needs Assistance Bed Mobility: Supine to Sit     Supine to sit: Min guard;HOB elevated Sit to supine: Min guard;HOB elevated   General bed mobility comments: pt self assisted L LE  Transfers Overall transfer level: Needs assistance Equipment used: Rolling walker (2 wheeled) Transfers: Sit to/from Stand Sit to Stand: Min assist;From elevated surface         General transfer comment: assist to rise and steady, verbal cues for UE and LE positioning  Ambulation/Gait Ambulation/Gait assistance: Min guard Ambulation Distance (Feet): 140 Feet Assistive device: Rolling walker (2 wheeled) Gait Pattern/deviations: Step-to pattern;Antalgic;Decreased stance time - left     General Gait Details: verbal cues for RW positioning and step length, distance per tolerance   Stairs            Wheelchair Mobility    Modified Rankin (Stroke Patients Only)       Balance                                             Cognition Arousal/Alertness: Awake/alert Behavior During Therapy: WFL for tasks assessed/performed Overall Cognitive Status: Within Functional Limits for tasks assessed                                        Exercises     General Comments        Pertinent Vitals/Pain Pain Assessment: 0-10 Pain Score: 5  Pain Location: L thigh Pain Descriptors / Indicators: Sore Pain Intervention(s): Limited activity within patient's tolerance;Repositioned;Monitored during session;Premedicated before session;Ice applied    Home Living                      Prior Function            PT Goals (current goals can now be found in the care plan section) Progress towards PT goals: Progressing toward goals    Frequency    7X/week      PT Plan Current plan remains appropriate    Co-evaluation              AM-PAC PT "6 Clicks" Daily Activity  Outcome Measure  Difficulty turning over in bed (including adjusting bedclothes, sheets and blankets)?: A Little Difficulty moving from lying on back to  sitting on the side of the bed? : A Little Difficulty sitting down on and standing up from a chair with arms (e.g., wheelchair, bedside commode, etc,.)?: A Lot Help needed moving to and from a bed to chair (including a wheelchair)?: A Little Help needed walking in hospital room?: A Little Help needed climbing 3-5 steps with a railing? : A Lot 6 Click Score: 16    End of Session Equipment Utilized During Treatment: Gait belt Activity Tolerance: Patient tolerated treatment well Patient left: in bed;with call bell/phone within reach Nurse Communication: Mobility status PT Visit Diagnosis: Other abnormalities of gait and mobility (R26.89)     Time: 4270-6237 PT Time Calculation (min) (ACUTE ONLY): 25 min  Charges:  $Gait Training: 23-37 mins                     G Codes:      Carmelia Bake, PT, DPT 06/11/2017 Pager:  [phone]   York Ram E 06/11/2017, 2:05 PM

## 2017-06-11 NOTE — Progress Notes (Signed)
Physical Therapy Treatment Patient Details Name: [name] MRN: [id number] DOB: 1941/05/17 Today's Date: 06/11/2017    History of Present Illness 76 y.o. female admitted on 06/10/17 for elective L TKA.  Pt with significant PMH of HTN, hearing loss, fibromyalgia, chronic back pain, and bradycardia.    PT Comments    Pt performed LE exercises and then ambulated in hallway.  Follow Up Recommendations  Outpatient PT     Equipment Recommendations  None recommended by PT    Recommendations for Other Services       Precautions / Restrictions Precautions Precautions: Knee Required Braces or Orthoses: Knee Immobilizer - Left Restrictions LLE Weight Bearing: Weight bearing as tolerated    Mobility  Bed Mobility Overal bed mobility: Needs Assistance Bed Mobility: Supine to Sit     Supine to sit: Min guard;HOB elevated     General bed mobility comments: verbal cues for self assist  Transfers Overall transfer level: Needs assistance Equipment used: Rolling walker (2 wheeled) Transfers: Sit to/from Stand Sit to Stand: Min assist;From elevated surface         General transfer comment: assist to rise and steady, verbal cues for UE and LE positioning  Ambulation/Gait Ambulation/Gait assistance: Min guard Ambulation Distance (Feet): 100 Feet Assistive device: Rolling walker (2 wheeled) Gait Pattern/deviations: Step-to pattern;Antalgic;Decreased stance time - left     General Gait Details: verbal cues for RW positioning and step length   Stairs            Wheelchair Mobility    Modified Rankin (Stroke Patients Only)       Balance                                            Cognition Arousal/Alertness: Awake/alert Behavior During Therapy: WFL for tasks assessed/performed Overall Cognitive Status: Within Functional Limits for tasks assessed                                        Exercises Total Joint Exercises Ankle  Circles/Pumps: AROM;10 reps;Both;Supine Quad Sets: AROM;Left;Supine;10 reps Heel Slides: AAROM;Left;10 reps;Supine Straight Leg Raises: AAROM;Left;Supine;10 reps    General Comments        Pertinent Vitals/Pain Pain Assessment: 0-10 Pain Score: 6  Pain Location: L knee area Pain Descriptors / Indicators: Aching;Sore Pain Intervention(s): Patient requesting pain meds-RN notified;Monitored during session;Limited activity within patient's tolerance;Repositioned;Ice applied    Home Living                      Prior Function            PT Goals (current goals can now be found in the care plan section) Progress towards PT goals: Progressing toward goals    Frequency    7X/week      PT Plan Current plan remains appropriate    Co-evaluation              AM-PAC PT "6 Clicks" Daily Activity  Outcome Measure  Difficulty turning over in bed (including adjusting bedclothes, sheets and blankets)?: A Little Difficulty moving from lying on back to sitting on the side of the bed? : A Lot Difficulty sitting down on and standing up from a chair with arms (e.g., wheelchair, bedside commode, etc,.)?: A Lot Help needed  moving to and from a bed to chair (including a wheelchair)?: A Little Help needed walking in hospital room?: A Little Help needed climbing 3-5 steps with a railing? : A Lot 6 Click Score: 15    End of Session Equipment Utilized During Treatment: Gait belt;Left knee immobilizer Activity Tolerance: Patient tolerated treatment well Patient left: in chair;with call bell/phone within reach;with family/visitor present Nurse Communication: Mobility status;Patient requests pain meds PT Visit Diagnosis: Other abnormalities of gait and mobility (R26.89)     Time: 1388-7195 PT Time Calculation (min) (ACUTE ONLY): 29 min  Charges:  $Gait Training: 8-22 mins $Therapeutic Exercise: 8-22 mins                    G Codes:       Carmelia Bake, PT,  DPT 06/11/2017 Pager: [phone]  [place] Ram E 06/11/2017, 12:19 PM

## 2017-06-11 NOTE — Discharge Summary (Signed)
Physician Discharge Summary   Patient ID: [name] MRN: [id number] DOB/AGE: 05-16-1941 76 y.o.  Admit date: 06/10/2017 Discharge date: 06/12/2017  Primary Diagnosis:  Osteoarthritis  Left knee(s)  Admission Diagnoses:  Past Medical History:  Diagnosis Date  . Anal or rectal pain    sometimes  . Arthritis   . Bradycardia    " I KNOW I HAVE BRADYCARDIA ESPECIALLY WHEN I SLEEP"   . Chronic back pain   . Degenerative joint disease   . Depression   . Diverticulosis 2003  . Elevated total protein   . Esophageal dysmotility   . Fibromyalgia   . GERD (gastroesophageal reflux disease)    subsequent Nissen Fundoplication  . Glaucoma   . Hearing loss   . Hemorrhoids   . Hiatal hernia 11/08/09  . Hx of adenomatous colonic polyps 07/02/02  . Hypercalcemia   . Hyperlipidemia   . Hyperlipidemia   . Hypertension   . Nausea   . Osteoporosis   . Rectal bleeding    from hemorrhoids.    . Sleep apnea    DOES USE CPAP   . Thrombocytopenia ([place])    Discharge Diagnoses:   Principal Problem:   OA (osteoarthritis) of knee  Estimated body mass index is 28.7 kg/m as calculated from the following:   Height as of this encounter: 5' 10"  (1.778 m).   Weight as of this encounter: 90.7 kg (200 lb).  Procedure:  Procedure(s) (LRB): LEFT  TOTAL KNEE ARTHROPLASTY (Left)   Consults: None  HPI: [name] is a 76 y.o. year old female with end stage OA of her left knee with progressively worsening pain and dysfunction. She has constant pain, with activity and at rest and significant functional deficits with difficulties even with ADLs. She has had extensive non-op management including analgesics, injections of cortisone and viscosupplements, and home exercise program, but remains in significant pain with significant dysfunction. Radiographs show bone on bone arthritis medial and patellofemoral. She presents now for left Total Knee Arthroplasty.   Laboratory Data: Admission on 06/10/2017    Component Date Value Ref Range Status  . aPTT 06/10/2017 28  24 - 36 seconds Final  . WBC 06/10/2017 5.0  4.0 - 10.5 K/uL Final  . RBC 06/10/2017 4.03  3.87 - 5.11 MIL/uL Final  . Hemoglobin 06/10/2017 12.4  12.0 - 15.0 g/dL Final  . HCT 06/10/2017 37.9  36.0 - 46.0 % Final  . MCV 06/10/2017 94.0  78.0 - 100.0 fL Final  . MCH 06/10/2017 30.8  26.0 - 34.0 pg Final  . MCHC 06/10/2017 32.7  30.0 - 36.0 g/dL Final  . RDW 06/10/2017 12.7  11.5 - 15.5 % Final  . Platelets 06/10/2017 139* 150 - 400 K/uL Final  . Sodium 06/10/2017 140  135 - 145 mmol/L Final  . Potassium 06/10/2017 4.4  3.5 - 5.1 mmol/L Final  . Chloride 06/10/2017 106  101 - 111 mmol/L Final  . CO2 06/10/2017 30  22 - 32 mmol/L Final  . Glucose, Bld 06/10/2017 125* 65 - 99 mg/dL Final  . BUN 06/10/2017 16  6 - 20 mg/dL Final  . Creatinine, Ser 06/10/2017 0.89  0.44 - 1.00 mg/dL Final  . Calcium 06/10/2017 9.2  8.9 - 10.3 mg/dL Final  . Total Protein 06/10/2017 7.0  6.5 - 8.1 g/dL Final  . Albumin 06/10/2017 3.9  3.5 - 5.0 g/dL Final  . AST 06/10/2017 26  15 - 41 U/L Final  . ALT 06/10/2017 18  14 - 54 U/L Final  . Alkaline Phosphatase 06/10/2017 66  38 - 126 U/L Final  . Total Bilirubin 06/10/2017 0.3  0.3 - 1.2 mg/dL Final  . GFR calc non Af Amer 06/10/2017 >60  >60 mL/min Final  . GFR calc Af Amer 06/10/2017 >60  >60 mL/min Final   Comment: (NOTE) The eGFR has been calculated using the CKD EPI equation. This calculation has not been validated in all clinical situations. eGFR's persistently <60 mL/min signify possible Chronic Kidney Disease.   . Anion gap 06/10/2017 4* 5 - 15 Final  . Prothrombin Time 06/10/2017 13.4  11.4 - 15.2 seconds Final  . INR 06/10/2017 1.01   Final  . WBC 06/11/2017 9.3  4.0 - 10.5 K/uL Final  . RBC 06/11/2017 3.83* 3.87 - 5.11 MIL/uL Final  . Hemoglobin 06/11/2017 11.7* 12.0 - 15.0 g/dL Final  . HCT 06/11/2017 35.3* 36.0 - 46.0 % Final  . MCV 06/11/2017 92.2  78.0 - 100.0 fL Final  .  MCH 06/11/2017 30.5  26.0 - 34.0 pg Final  . MCHC 06/11/2017 33.1  30.0 - 36.0 g/dL Final  . RDW 06/11/2017 12.7  11.5 - 15.5 % Final  . Platelets 06/11/2017 148* 150 - 400 K/uL Final  . Sodium 06/11/2017 135  135 - 145 mmol/L Final  . Potassium 06/11/2017 4.7  3.5 - 5.1 mmol/L Final  . Chloride 06/11/2017 101  101 - 111 mmol/L Final  . CO2 06/11/2017 27  22 - 32 mmol/L Final  . Glucose, Bld 06/11/2017 134* 65 - 99 mg/dL Final  . BUN 06/11/2017 15  6 - 20 mg/dL Final  . Creatinine, Ser 06/11/2017 0.98  0.44 - 1.00 mg/dL Final  . Calcium 06/11/2017 8.8* 8.9 - 10.3 mg/dL Final  . GFR calc non Af Amer 06/11/2017 55* >60 mL/min Final  . GFR calc Af Amer 06/11/2017 >60  >60 mL/min Final   Comment: (NOTE) The eGFR has been calculated using the CKD EPI equation. This calculation has not been validated in all clinical situations. eGFR's persistently <60 mL/min signify possible Chronic Kidney Disease.   Georgiann Hahn gap 06/11/2017 7  5 - 15 Final  Hospital Outpatient Visit on 06/03/2017  Component Date Value Ref Range Status  . aPTT 06/03/2017 28  24 - 36 seconds Final  . WBC 06/03/2017 4.8  4.0 - 10.5 K/uL Final  . RBC 06/03/2017 4.18  3.87 - 5.11 MIL/uL Final  . Hemoglobin 06/03/2017 12.9  12.0 - 15.0 g/dL Final  . HCT 06/03/2017 39.4  36.0 - 46.0 % Final  . MCV 06/03/2017 94.3  78.0 - 100.0 fL Final  . MCH 06/03/2017 30.9  26.0 - 34.0 pg Final  . MCHC 06/03/2017 32.7  30.0 - 36.0 g/dL Final  . RDW 06/03/2017 12.8  11.5 - 15.5 % Final  . Platelets 06/03/2017 151  150 - 400 K/uL Final  . Sodium 06/03/2017 139  135 - 145 mmol/L Final  . Potassium 06/03/2017 4.4  3.5 - 5.1 mmol/L Final  . Chloride 06/03/2017 105  101 - 111 mmol/L Final  . CO2 06/03/2017 30  22 - 32 mmol/L Final  . Glucose, Bld 06/03/2017 95  65 - 99 mg/dL Final  . BUN 06/03/2017 13  6 - 20 mg/dL Final  . Creatinine, Ser 06/03/2017 1.02* 0.44 - 1.00 mg/dL Final  . Calcium 06/03/2017 9.6  8.9 - 10.3 mg/dL Final  . Total  Protein 06/03/2017 7.2  6.5 - 8.1 g/dL Final  . Albumin 06/03/2017  4.2  3.5 - 5.0 g/dL Final  . AST 06/03/2017 25  15 - 41 U/L Final  . ALT 06/03/2017 17  14 - 54 U/L Final  . Alkaline Phosphatase 06/03/2017 63  38 - 126 U/L Final  . Total Bilirubin 06/03/2017 0.5  0.3 - 1.2 mg/dL Final  . GFR calc non Af Amer 06/03/2017 53* >60 mL/min Final  . GFR calc Af Amer 06/03/2017 >60  >60 mL/min Final   Comment: (NOTE) The eGFR has been calculated using the CKD EPI equation. This calculation has not been validated in all clinical situations. eGFR's persistently <60 mL/min signify possible Chronic Kidney Disease.   . Anion gap 06/03/2017 4* 5 - 15 Final  . Prothrombin Time 06/03/2017 13.6  11.4 - 15.2 seconds Final  . INR 06/03/2017 1.04   Final  . ABO/RH(D) 06/03/2017 B POS   Final  . Antibody Screen 06/03/2017 NEG   Final  . Sample Expiration 06/03/2017 06/13/2017   Final  . Extend sample reason 06/03/2017 NO TRANSFUSIONS OR PREGNANCY IN THE PAST 3 MONTHS   Final  . MRSA, PCR 06/03/2017 NEGATIVE  NEGATIVE Final  . Staphylococcus aureus 06/03/2017 NEGATIVE  NEGATIVE Final   Comment:        The Xpert SA Assay (FDA approved for NASAL specimens in patients over 16 years of age), is one component of a comprehensive surveillance program.  Test performance has been validated by [facility] for patients greater than or equal to 10 year old. It is not intended to diagnose infection nor to guide or monitor treatment.   . ABO/RH(D) 06/03/2017 B POS   Final     X-Rays:No results found.  EKG: Orders placed or performed during the hospital encounter of 12/21/16  . EKG 12-Lead  . EKG 12-Lead  . ED EKG  . ED EKG  . EKG     Hospital Course: ALEIA LAROCCA is a 76 y.o. who was admitted to [facility]. They were brought to the operating room on 06/10/2017 and underwent Procedure(s): LEFT  TOTAL KNEE ARTHROPLASTY.  Patient tolerated the procedure well and was later transferred to the  recovery room and then to the orthopaedic floor for postoperative care.  They were given PO and IV analgesics for pain control following their surgery.  They were given 24 hours of postoperative antibiotics of  Anti-infectives    Start     Dose/Rate Route Frequency Ordered Stop   06/10/17 1300  ceFAZolin (ANCEF) IVPB 2g/100 mL premix     2 g 200 mL/hr over 30 Minutes Intravenous Every 6 hours 06/10/17 1014 06/10/17 1945   06/10/17 0515  ceFAZolin (ANCEF) 2-4 GM/100ML-% IVPB    Comments:  Waldron Session   : cabinet override      06/10/17 0515 06/10/17 0720   06/10/17 0509  ceFAZolin (ANCEF) IVPB 2g/100 mL premix     2 g 200 mL/hr over 30 Minutes Intravenous On call to O.R. 06/10/17 [id number] 06/10/17 0750     and started on DVT prophylaxis in the form of Xarelto.   PT and OT were ordered for total joint protocol.  Discharge planning consulted to help with postop disposition and equipment needs.  Patient had a decent night on the evening of surgery.  They started to get up OOB with therapy on day one. Hemovac drain was pulled without difficulty.  Continued to work with therapy into day two.  Dressing was changed on day two and the incision was healing well.  Patient  was seen in rounds on day two and was ready to go home.   Diet: Regular diet Activity:WBAT Follow-up:in 2 weeks Disposition - Home Discharged Condition: good   Discharge Instructions    Call MD / Call 911    Complete by:  As directed    If you experience chest pain or shortness of breath, CALL 911 and be transported to the hospital emergency room.  If you develope a fever above 101 F, pus (white drainage) or increased drainage or redness at the wound, or calf pain, call your surgeon's office.   Change dressing    Complete by:  As directed    Change dressing daily with sterile 4 x 4 inch gauze dressing and apply TED hose. Do not submerge the incision under water.   Constipation Prevention    Complete by:  As directed    Drink  plenty of fluids.  Prune juice may be helpful.  You may use a stool softener, such as Colace (over the counter) 100 mg twice a day.  Use MiraLax (over the counter) for constipation as needed.   Diet - low sodium heart healthy    Complete by:  As directed    Discharge instructions    Complete by:  As directed    Take Xarelto for two and a half more weeks, then discontinue Xarelto. Once the patient has completed the blood thinner regimen, then take a Baby 81 mg Aspirin daily for three more weeks.   Pick up stool softner and laxative for home use following surgery while on pain medications. Do not submerge incision under water. Please use good hand washing techniques while changing dressing each day. May shower starting three days after surgery. Please use a clean towel to pat the incision dry following showers. Continue to use ice for pain and swelling after surgery. Do not use any lotions or creams on the incision until instructed by your surgeon.  Wear both TED hose on both legs during the day every day for three weeks, but may remove the TED hose at night at home.  Postoperative Constipation Protocol  Constipation - defined medically as fewer than three stools per week and severe constipation as less than one stool per week.  One of the most common issues patients have following surgery is constipation.  Even if you have a regular bowel pattern at home, your normal regimen is likely to be disrupted due to multiple reasons following surgery.  Combination of anesthesia, postoperative narcotics, change in appetite and fluid intake all can affect your bowels.  In order to avoid complications following surgery, here are some recommendations in order to help you during your recovery period.  Colace (docusate) - Pick up an over-the-counter form of Colace or another stool softener and take twice a day as long as you are requiring postoperative pain medications.  Take with a full glass of water  daily.  If you experience loose stools or diarrhea, hold the colace until you stool forms back up.  If your symptoms do not get better within 1 week or if they get worse, check with your doctor.  Dulcolax (bisacodyl) - Pick up over-the-counter and take as directed by the product packaging as needed to assist with the movement of your bowels.  Take with a full glass of water.  Use this product as needed if not relieved by Colace only.   MiraLax (polyethylene glycol) - Pick up over-the-counter to have on hand.  MiraLax is a solution that will  increase the amount of water in your bowels to assist with bowel movements.  Take as directed and can mix with a glass of water, juice, soda, coffee, or tea.  Take if you go more than two days without a movement. Do not use MiraLax more than once per day. Call your doctor if you are still constipated or irregular after using this medication for 7 days in a row.  If you continue to have problems with postoperative constipation, please contact the office for further assistance and recommendations.  If you experience "the worst abdominal pain ever" or develop nausea or vomiting, please contact the office immediatly for further recommendations for treatment.   Do not put a pillow under the knee. Place it under the heel.    Complete by:  As directed    Do not sit on low chairs, stoools or toilet seats, as it may be difficult to get up from low surfaces    Complete by:  As directed    Driving restrictions    Complete by:  As directed    No driving until released by the physician.   Increase activity slowly as tolerated    Complete by:  As directed    Lifting restrictions    Complete by:  As directed    No lifting until released by the physician.   Patient may shower    Complete by:  As directed    You may shower without a dressing once there is no drainage.  Do not wash over the wound.  If drainage remains, do not shower until drainage stops.   TED hose     Complete by:  As directed    Use stockings (TED hose) for 3 weeks on both leg(s).  You may remove them at night for sleeping.   Weight bearing as tolerated    Complete by:  As directed    Laterality:  left   Extremity:  Lower     Allergies as of 06/11/2017      Reactions   Adalat [nifedipine] Other (See Comments)   Unknown   Hydrocodone Itching, Nausea And Vomiting   Meperidine Hcl Other (See Comments)   Demerol causes hallucinations   Naproxen Nausea And Vomiting      Medication List    STOP taking these medications   Azelastine-Fluticasone 137-50 MCG/ACT Susp Commonly known as:  DYMISTA   diclofenac sodium 1 % Gel Commonly known as:  VOLTAREN   FISH OIL PO   Vitamin D3 5000 units Tabs     TAKE these medications   acetaminophen 500 MG tablet Commonly known as:  TYLENOL Take 500-1,000 mg by mouth 2 (two) times daily as needed for mild pain.   Albuterol Sulfate 108 (90 Base) MCG/ACT Aepb Commonly known as:  PROAIR RESPICLICK Inhale 2 puffs into the lungs every 6 (six) hours as needed. What changed:  reasons to take this   bimatoprost 0.03 % ophthalmic solution Commonly known as:  LUMIGAN Place 1 drop into both eyes at bedtime.   cetirizine 10 MG tablet Commonly known as:  ZYRTEC Take 10 mg by mouth at bedtime.   FLUoxetine 20 MG tablet Commonly known as:  PROZAC Take 20 mg by mouth daily.   HYDROmorphone 2 MG tablet Commonly known as:  DILAUDID Take 1-2 tablets (2-4 mg total) by mouth every 4 (four) hours as needed for severe pain.   losartan 50 MG tablet Commonly known as:  COZAAR Take 50 mg by mouth daily.  methocarbamol 500 MG tablet Commonly known as:  ROBAXIN Take 1 tablet (500 mg total) by mouth every 6 (six) hours as needed for muscle spasms.   montelukast 10 MG tablet Commonly known as:  SINGULAIR Take 10 mg by mouth at bedtime.   nystatin ointment Commonly known as:  MYCOSTATIN 1 APPLICATION TOPICALLY TO AFFECTED AREA 2 TIMES PER DAY AS  NEEDED FOR RASH   nystatin powder Commonly known as:  MYCOSTATIN/NYSTOP 1 application topical once a day as needed rash   PRILOSEC 40 MG capsule Generic drug:  omeprazole Take 40 mg by mouth daily.   ranitidine 300 MG tablet Commonly known as:  ZANTAC TAKE 1 TABLET AT BEDTIME AS NEEDED FOR HEARTBURN What changed:  See the new instructions.   rivaroxaban 10 MG Tabs tablet Commonly known as:  XARELTO Take 1 tablet (10 mg total) by mouth daily with breakfast. Take Xarelto for two and a half more weeks following discharge from the hospital, then discontinue Xarelto. Once the patient has completed the blood thinner regimen, then take a Baby 81 mg Aspirin daily for three more weeks.   rosuvastatin 40 MG tablet Commonly known as:  CRESTOR Take 40 mg by mouth daily.   traMADol 50 MG tablet Commonly known as:  ULTRAM Take 1-2 tablets (50-100 mg total) by mouth every 6 (six) hours as needed for moderate pain.            Discharge Care Instructions        Start     Ordered   06/12/17 0000  rivaroxaban (XARELTO) 10 MG TABS tablet  Daily with breakfast    Question:  Supervising Provider  Answer:  Gaynelle Arabian   06/11/17 2138   06/11/17 0000  HYDROmorphone (DILAUDID) 2 MG tablet  Every 4 hours PRN    Question:  Supervising Provider  Answer:  Gaynelle Arabian   06/11/17 2138   06/11/17 0000  methocarbamol (ROBAXIN) 500 MG tablet  Every 6 hours PRN    Question:  Supervising Provider  Answer:  Gaynelle Arabian   06/11/17 2138   06/11/17 0000  traMADol (ULTRAM) 50 MG tablet  Every 6 hours PRN    Question:  Supervising Provider  Answer:  Gaynelle Arabian   06/11/17 2138   06/11/17 0000  Call MD / Call 911    Comments:  If you experience chest pain or shortness of breath, CALL 911 and be transported to the hospital emergency room.  If you develope a fever above 101 F, pus (white drainage) or increased drainage or redness at the wound, or calf pain, call your surgeon's office.   06/11/17  2138   06/11/17 0000  Discharge instructions    Comments:  Take Xarelto for two and a half more weeks, then discontinue Xarelto. Once the patient has completed the blood thinner regimen, then take a Baby 81 mg Aspirin daily for three more weeks.   Pick up stool softner and laxative for home use following surgery while on pain medications. Do not submerge incision under water. Please use good hand washing techniques while changing dressing each day. May shower starting three days after surgery. Please use a clean towel to pat the incision dry following showers. Continue to use ice for pain and swelling after surgery. Do not use any lotions or creams on the incision until instructed by your surgeon.  Wear both TED hose on both legs during the day every day for three weeks, but may remove the TED hose at night at  home.  Postoperative Constipation Protocol  Constipation - defined medically as fewer than three stools per week and severe constipation as less than one stool per week.  One of the most common issues patients have following surgery is constipation.  Even if you have a regular bowel pattern at home, your normal regimen is likely to be disrupted due to multiple reasons following surgery.  Combination of anesthesia, postoperative narcotics, change in appetite and fluid intake all can affect your bowels.  In order to avoid complications following surgery, here are some recommendations in order to help you during your recovery period.  Colace (docusate) - Pick up an over-the-counter form of Colace or another stool softener and take twice a day as long as you are requiring postoperative pain medications.  Take with a full glass of water daily.  If you experience loose stools or diarrhea, hold the colace until you stool forms back up.  If your symptoms do not get better within 1 week or if they get worse, check with your doctor.  Dulcolax (bisacodyl) - Pick up over-the-counter and take as  directed by the product packaging as needed to assist with the movement of your bowels.  Take with a full glass of water.  Use this product as needed if not relieved by Colace only.   MiraLax (polyethylene glycol) - Pick up over-the-counter to have on hand.  MiraLax is a solution that will increase the amount of water in your bowels to assist with bowel movements.  Take as directed and can mix with a glass of water, juice, soda, coffee, or tea.  Take if you go more than two days without a movement. Do not use MiraLax more than once per day. Call your doctor if you are still constipated or irregular after using this medication for 7 days in a row.  If you continue to have problems with postoperative constipation, please contact the office for further assistance and recommendations.  If you experience "the worst abdominal pain ever" or develop nausea or vomiting, please contact the office immediatly for further recommendations for treatment.   06/11/17 2138   06/11/17 0000  Diet - low sodium heart healthy     06/11/17 2138   06/11/17 0000  Constipation Prevention    Comments:  Drink plenty of fluids.  Prune juice may be helpful.  You may use a stool softener, such as Colace (over the counter) 100 mg twice a day.  Use MiraLax (over the counter) for constipation as needed.   06/11/17 2138   06/11/17 0000  Increase activity slowly as tolerated     06/11/17 2138   06/11/17 0000  Patient may shower    Comments:  You may shower without a dressing once there is no drainage.  Do not wash over the wound.  If drainage remains, do not shower until drainage stops.   06/11/17 2138   06/11/17 0000  Weight bearing as tolerated    Question Answer Comment  Laterality left   Extremity Lower      06/11/17 2138   06/11/17 0000  Driving restrictions    Comments:  No driving until released by the physician.   06/11/17 2138   06/11/17 0000  Lifting restrictions    Comments:  No lifting until released by the  physician.   06/11/17 2138   06/11/17 0000  TED hose    Comments:  Use stockings (TED hose) for 3 weeks on both leg(s).  You may remove them at night for  sleeping.   06/11/17 2138   06/11/17 0000  Change dressing    Comments:  Change dressing daily with sterile 4 x 4 inch gauze dressing and apply TED hose. Do not submerge the incision under water.   06/11/17 2138   06/11/17 0000  Do not put a pillow under the knee. Place it under the heel.     06/11/17 2138   06/11/17 0000  Do not sit on low chairs, stoools or toilet seats, as it may be difficult to get up from low surfaces     06/11/17 2138     Follow-up Information    Gaynelle Arabian, MD. Schedule an appointment as soon as possible for a visit on 06/25/2017.   Specialty:  Orthopedic Surgery Contact information: [address] [place] [address] [phone]           Signed: Arlee Muslim, PA-C Orthopaedic Surgery 06/11/2017, 9:39 PM

## 2017-06-11 NOTE — Progress Notes (Addendum)
Discharge planning, no HH needs identified. Plan for OP PT, has DME. [phone] 

## 2017-06-12 LAB — CBC
HCT: 34 % — ABNORMAL LOW (ref 36.0–46.0)
Hemoglobin: 11.3 g/dL — ABNORMAL LOW (ref 12.0–15.0)
MCH: 30.8 pg (ref 26.0–34.0)
MCHC: 33.2 g/dL (ref 30.0–36.0)
MCV: 92.6 fL (ref 78.0–100.0)
Platelets: 138 10*3/uL — ABNORMAL LOW (ref 150–400)
RBC: 3.67 MIL/uL — ABNORMAL LOW (ref 3.87–5.11)
RDW: 12.9 % (ref 11.5–15.5)
WBC: 9.2 10*3/uL (ref 4.0–10.5)

## 2017-06-12 LAB — BASIC METABOLIC PANEL
Anion gap: 5 (ref 5–15)
BUN: 16 mg/dL (ref 6–20)
CHLORIDE: 103 mmol/L (ref 101–111)
CO2: 29 mmol/L (ref 22–32)
Calcium: 8.8 mg/dL — ABNORMAL LOW (ref 8.9–10.3)
Creatinine, Ser: 0.87 mg/dL (ref 0.44–1.00)
Glucose, Bld: 130 mg/dL — ABNORMAL HIGH (ref 65–99)
POTASSIUM: 4.3 mmol/L (ref 3.5–5.1)
SODIUM: 137 mmol/L (ref 135–145)

## 2017-06-12 NOTE — Progress Notes (Signed)
Physical Therapy Treatment Patient Details Name: [name] MRN: [id number] DOB: Sep 20, 1941 Today's Date: 06/12/2017    History of Present Illness 76 y.o. female admitted on 06/10/17 for elective L TKA.  Pt with significant PMH of HTN, hearing loss, fibromyalgia, chronic back pain, and bradycardia.    PT Comments    *The patient is ready for Dc, practiced steps with daughter present.  Follow Up Recommendations  Outpatient PT     Equipment Recommendations  None recommended by PT    Recommendations for Other Services       Precautions / Restrictions Precautions Precautions: Knee Precaution Booklet Issued: Yes (comment) Precaution Comments: knee exercise handout given Required Braces or Orthoses: Knee Immobilizer - Left Restrictions LLE Weight Bearing: Weight bearing as tolerated    Mobility  Bed Mobility               General bed mobility comments: pt on bed edge  Transfers Overall transfer level: Needs assistance Equipment used: Rolling walker (2 wheeled) Transfers: Sit to/from Stand Sit to Stand: Min assist;From elevated surface         General transfer comment: assist to rise and steady, verbal cues for UE and LE positioning  Ambulation/Gait Ambulation/Gait assistance: Min guard Ambulation Distance (Feet): 20 Feet Assistive device: Rolling walker (2 wheeled) Gait Pattern/deviations: Step-to pattern;Antalgic;Decreased stance time - left         Stairs Stairs: Yes   Stair Management: No rails;Backwards;With walker Number of Stairs: 2 General stair comments: daughter present to assit.  Wheelchair Mobility    Modified Rankin (Stroke Patients Only)       Balance                                            Cognition Arousal/Alertness: Awake/alert                                            Exercises    General Comments        Pertinent Vitals/Pain Pain Score: 4  Pain Location: L thigh Pain  Descriptors / Indicators: Sore;Aching Pain Intervention(s): Monitored during session;Premedicated before session    Home Living                      Prior Function            PT Goals (current goals can now be found in the care plan section) Progress towards PT goals: Progressing toward goals    Frequency    7X/week      PT Plan Current plan remains appropriate    Co-evaluation              AM-PAC PT "6 Clicks" Daily Activity  Outcome Measure  Difficulty turning over in bed (including adjusting bedclothes, sheets and blankets)?: A Little Difficulty moving from lying on back to sitting on the side of the bed? : A Little Difficulty sitting down on and standing up from a chair with arms (e.g., wheelchair, bedside commode, etc,.)?: A Lot Help needed moving to and from a bed to chair (including a wheelchair)?: A Little Help needed walking in hospital room?: A Little Help needed climbing 3-5 steps with a railing? : A Lot 6 Click Score: 13    End  of Session   Activity Tolerance: Patient tolerated treatment well Patient left: in bed;with call bell/phone within reach Nurse Communication: Mobility status PT Visit Diagnosis: Other abnormalities of gait and mobility (R26.89)     Time: 5643-3295 PT Time Calculation (min) (ACUTE ONLY): 16 min  Charges:  $Gait Training: 8-22 mins                     G CodesTresa Bates PT [phone]   [name] 06/12/2017, 1:04 PM

## 2017-06-12 NOTE — Progress Notes (Signed)
   Subjective: 2 Days Post-Op Procedure(s) (LRB): LEFT  TOTAL KNEE ARTHROPLASTY (Left) Patient reports pain as mild.   Patient seen in rounds by Dr. Wynelle Link. Patient is well, and has had no acute complaints or problems Patient is ready to go home  Objective: Vital signs in last 24 hours: Temp:  [97.8 F (36.6 C)-98.6 F (37 C)] 97.8 F (36.6 C) (08/29 0537) Pulse Rate:  [61-76] 67 (08/29 0537) Resp:  [16-17] 17 (08/29 0537) BP: (117-153)/(53-77) 153/67 (08/29 0537) SpO2:  [91 %-94 %] 93 % (08/29 0537)  Intake/Output from previous day:  Intake/Output Summary (Last 24 hours) at 06/12/17 0916 Last data filed at 06/12/17 0538  Gross per 24 hour  Intake             1135 ml  Output             1475 ml  Net             -340 ml    Intake/Output this shift: No intake/output data recorded.  Labs:  Recent Labs  06/10/17 1019 06/11/17 0519 06/12/17 0505  HGB 12.4 11.7* 11.3*    Recent Labs  06/11/17 0519 06/12/17 0505  WBC 9.3 9.2  RBC 3.83* 3.67*  HCT 35.3* 34.0*  PLT 148* 138*    Recent Labs  06/11/17 0519 06/12/17 0505  NA 135 137  K 4.7 4.3  CL 101 103  CO2 27 29  BUN 15 16  CREATININE 0.98 0.87  GLUCOSE 134* 130*  CALCIUM 8.8* 8.8*    Recent Labs  06/10/17 1019  INR 1.01    EXAM: General - Patient is Alert and Appropriate Extremity - Neurovascular intact Sensation intact distally Incision - clean, dry Motor Function - intact, moving foot and toes well on exam.   Assessment/Plan: 2 Days Post-Op Procedure(s) (LRB): LEFT  TOTAL KNEE ARTHROPLASTY (Left) Procedure(s) (LRB): LEFT  TOTAL KNEE ARTHROPLASTY (Left) Past Medical History:  Diagnosis Date  . Anal or rectal pain    sometimes  . Arthritis   . Bradycardia    " I KNOW I HAVE BRADYCARDIA ESPECIALLY WHEN I SLEEP"   . Chronic back pain   . Degenerative joint disease   . Depression   . Diverticulosis 2003  . Elevated total protein   . Esophageal dysmotility   . Fibromyalgia   .  GERD (gastroesophageal reflux disease)    subsequent Nissen Fundoplication  . Glaucoma   . Hearing loss   . Hemorrhoids   . Hiatal hernia 11/08/09  . Hx of adenomatous colonic polyps 07/02/02  . Hypercalcemia   . Hyperlipidemia   . Hyperlipidemia   . Hypertension   . Nausea   . Osteoporosis   . Rectal bleeding    from hemorrhoids.    . Sleep apnea    DOES USE CPAP   . Thrombocytopenia (HCC)    Principal Problem:   OA (osteoarthritis) of knee  Estimated body mass index is 28.7 kg/m as calculated from the following:   Height as of this encounter: 5\' 10"  (1.778 m).   Weight as of this encounter: 90.7 kg (200 lb). Up with therapy Diet - Cardiac diet Follow up - in 2 weeks Activity - WBAT Disposition - Home Condition Upon Discharge - Good D/C Meds - See DC Summary DVT Prophylaxis - [place], PA-C Orthopaedic Surgery 06/12/2017, 9:16 AM

## 2017-06-12 NOTE — Progress Notes (Signed)
Physical Therapy Treatment Patient Details Name: ZYNIAH FERRAIOLO MRN: [id number] DOB: 12/30/1940 Today's Date: 06/12/2017    History of Present Illness 76 y.o. female admitted on 06/10/17 for elective L TKA.  Pt with significant PMH of HTN, hearing loss, fibromyalgia, chronic back pain, and bradycardia.    PT Comments    The patient  Will be ready for DC after practicing steps.   Follow Up Recommendations  Outpatient PT     Equipment Recommendations  None recommended by PT    Recommendations for Other Services       Precautions / Restrictions Precautions Precautions: Knee Precaution Booklet Issued: Yes (comment) Precaution Comments: knee exercise handout given Required Braces or Orthoses: Knee Immobilizer - Left Restrictions LLE Weight Bearing: Weight bearing as tolerated    Mobility  Bed Mobility                  Transfers                    Ambulation/Gait                 Stairs            Wheelchair Mobility    Modified Rankin (Stroke Patients Only)       Balance                                            Cognition Arousal/Alertness: Awake/alert                                            Exercises Total Joint Exercises Ankle Circles/Pumps: AROM;10 reps;Both;Supine Quad Sets: AROM;Left;Supine;10 reps Heel Slides: AAROM;Left;10 reps;Supine Hip ABduction/ADduction: AROM;Left;10 reps;Supine Straight Leg Raises: AAROM;Left;Supine;10 reps Goniometric ROM: 0-50 left knee flexion    General Comments        Pertinent Vitals/Pain Pain Score: 4  Pain Location: L thigh Pain Descriptors / Indicators: Sore;Aching Pain Intervention(s): Monitored during session;Premedicated before session;Patient requesting pain meds-RN notified;Repositioned    Home Living                      Prior Function            PT Goals (current goals can now be found in the care plan section) Progress  towards PT goals: Progressing toward goals    Frequency    7X/week      PT Plan Current plan remains appropriate    Co-evaluation              AM-PAC PT "6 Clicks" Daily Activity  Outcome Measure  Difficulty turning over in bed (including adjusting bedclothes, sheets and blankets)?: A Little Difficulty moving from lying on back to sitting on the side of the bed? : A Little Difficulty sitting down on and standing up from a chair with arms (e.g., wheelchair, bedside commode, etc,.)?: A Lot Help needed moving to and from a bed to chair (including a wheelchair)?: A Little Help needed walking in hospital room?: A Little Help needed climbing 3-5 steps with a railing? : A Lot 6 Click Score: 16    End of Session   Activity Tolerance: Patient tolerated treatment well Patient left: in bed;with call bell/phone within reach Nurse Communication: Mobility status PT  Visit Diagnosis: Other abnormalities of gait and mobility (R26.89)     Time: 1314-3888 PT Time Calculation (min) (ACUTE ONLY): 22 min  Charges:  $Therapeutic Exercise: 8-22 mins                    G CodesTresa Endo PT [phone]    Claretha Cooper 06/12/2017, 12:57 PM

## 2017-06-14 DIAGNOSIS — M1712 Unilateral primary osteoarthritis, left knee: Secondary | ICD-10-CM | POA: Diagnosis not present

## 2017-06-18 DIAGNOSIS — M1712 Unilateral primary osteoarthritis, left knee: Secondary | ICD-10-CM | POA: Diagnosis not present

## 2017-06-20 DIAGNOSIS — M1712 Unilateral primary osteoarthritis, left knee: Secondary | ICD-10-CM | POA: Diagnosis not present

## 2017-06-25 DIAGNOSIS — M1712 Unilateral primary osteoarthritis, left knee: Secondary | ICD-10-CM | POA: Diagnosis not present

## 2017-06-27 DIAGNOSIS — M1712 Unilateral primary osteoarthritis, left knee: Secondary | ICD-10-CM | POA: Diagnosis not present

## 2017-07-02 DIAGNOSIS — M1712 Unilateral primary osteoarthritis, left knee: Secondary | ICD-10-CM | POA: Diagnosis not present

## 2017-07-04 DIAGNOSIS — M1712 Unilateral primary osteoarthritis, left knee: Secondary | ICD-10-CM | POA: Diagnosis not present

## 2017-07-08 DIAGNOSIS — M1712 Unilateral primary osteoarthritis, left knee: Secondary | ICD-10-CM | POA: Diagnosis not present

## 2017-07-10 DIAGNOSIS — M1712 Unilateral primary osteoarthritis, left knee: Secondary | ICD-10-CM | POA: Diagnosis not present

## 2017-07-12 DIAGNOSIS — M1712 Unilateral primary osteoarthritis, left knee: Secondary | ICD-10-CM | POA: Diagnosis not present

## 2017-07-15 DIAGNOSIS — M1712 Unilateral primary osteoarthritis, left knee: Secondary | ICD-10-CM | POA: Diagnosis not present

## 2017-07-17 DIAGNOSIS — M1712 Unilateral primary osteoarthritis, left knee: Secondary | ICD-10-CM | POA: Diagnosis not present

## 2017-07-18 DIAGNOSIS — M1712 Unilateral primary osteoarthritis, left knee: Secondary | ICD-10-CM | POA: Diagnosis not present

## 2017-07-18 DIAGNOSIS — Z471 Aftercare following joint replacement surgery: Secondary | ICD-10-CM | POA: Diagnosis not present

## 2017-07-18 DIAGNOSIS — Z96652 Presence of left artificial knee joint: Secondary | ICD-10-CM | POA: Diagnosis not present

## 2017-07-19 DIAGNOSIS — M1712 Unilateral primary osteoarthritis, left knee: Secondary | ICD-10-CM | POA: Diagnosis not present

## 2017-07-22 DIAGNOSIS — M1712 Unilateral primary osteoarthritis, left knee: Secondary | ICD-10-CM | POA: Diagnosis not present

## 2017-07-23 DIAGNOSIS — Z23 Encounter for immunization: Secondary | ICD-10-CM | POA: Diagnosis not present

## 2017-07-24 DIAGNOSIS — M1712 Unilateral primary osteoarthritis, left knee: Secondary | ICD-10-CM | POA: Diagnosis not present

## 2017-07-26 DIAGNOSIS — M1712 Unilateral primary osteoarthritis, left knee: Secondary | ICD-10-CM | POA: Diagnosis not present

## 2017-07-30 ENCOUNTER — Ambulatory Visit (INDEPENDENT_AMBULATORY_CARE_PROVIDER_SITE_OTHER): Payer: Medicare Other | Admitting: Gastroenterology

## 2017-07-30 ENCOUNTER — Encounter: Payer: Self-pay | Admitting: Gastroenterology

## 2017-07-30 ENCOUNTER — Ambulatory Visit (INDEPENDENT_AMBULATORY_CARE_PROVIDER_SITE_OTHER)
Admission: RE | Admit: 2017-07-30 | Discharge: 2017-07-30 | Disposition: A | Discharge status: Home / Self Care | Payer: Medicare Other | Source: Ambulatory Visit | Attending: Gastroenterology | Admitting: Gastroenterology

## 2017-07-30 ENCOUNTER — Other Ambulatory Visit (INDEPENDENT_AMBULATORY_CARE_PROVIDER_SITE_OTHER): Payer: Medicare Other

## 2017-07-30 VITALS — BP 134/82 | HR 70 | Ht 70.0 in | Wt 194.4 lb

## 2017-07-30 DIAGNOSIS — R1013 Epigastric pain: Secondary | ICD-10-CM | POA: Diagnosis not present

## 2017-07-30 DIAGNOSIS — K219 Gastro-esophageal reflux disease without esophagitis: Secondary | ICD-10-CM

## 2017-07-30 DIAGNOSIS — K5909 Other constipation: Secondary | ICD-10-CM | POA: Diagnosis not present

## 2017-07-30 DIAGNOSIS — R112 Nausea with vomiting, unspecified: Secondary | ICD-10-CM | POA: Diagnosis not present

## 2017-07-30 DIAGNOSIS — K5900 Constipation, unspecified: Secondary | ICD-10-CM | POA: Diagnosis not present

## 2017-07-30 LAB — COMPREHENSIVE METABOLIC PANEL
ALK PHOS: 87 U/L (ref 39–117)
ALT: 14 U/L (ref 0–35)
AST: 22 U/L (ref 0–37)
Albumin: 4.4 g/dL (ref 3.5–5.2)
BUN: 12 mg/dL (ref 6–23)
CHLORIDE: 100 meq/L (ref 96–112)
CO2: 29 mEq/L (ref 19–32)
CREATININE: 0.91 mg/dL (ref 0.40–1.20)
Calcium: 10 mg/dL (ref 8.4–10.5)
GFR: 77.54 mL/min (ref >60.00)
GLUCOSE: 102 mg/dL — AB (ref 70–99)
POTASSIUM: 4.1 meq/L (ref 3.5–5.1)
SODIUM: 138 meq/L (ref 135–145)
Total Bilirubin: 0.6 mg/dL (ref 0.2–1.2)
Total Protein: 8 g/dL (ref 6.0–8.3)

## 2017-07-30 LAB — CBC WITH DIFFERENTIAL/PLATELET
BASOS PCT: 0.5 % (ref 0.0–3.0)
Basophils Absolute: 0 10*3/uL (ref 0.0–0.1)
EOS PCT: 4.7 % (ref 0.0–5.0)
Eosinophils Absolute: 0.2 10*3/uL (ref 0.0–0.7)
HCT: 39.1 % (ref 36.0–46.0)
Hemoglobin: 12.7 g/dL (ref 12.0–15.0)
LYMPHS ABS: 2.3 10*3/uL (ref 0.7–4.0)
Lymphocytes Relative: 46.2 % — ABNORMAL HIGH (ref 12.0–46.0)
MCHC: 32.4 g/dL (ref 30.0–36.0)
MCV: 94.3 fl (ref 78.0–100.0)
MONO ABS: 0.5 10*3/uL (ref 0.1–1.0)
Monocytes Relative: 10 % (ref 3.0–12.0)
Neutro Abs: 1.9 10*3/uL (ref 1.4–7.7)
Neutrophils Relative %: 38.6 % — ABNORMAL LOW (ref 43.0–77.0)
Platelets: 233 10*3/uL (ref 150.0–400.0)
RBC: 4.15 Mil/uL (ref 3.87–5.11)
RDW: 13.8 % (ref 11.5–15.5)
WBC: 5 10*3/uL (ref 4.0–10.5)

## 2017-07-30 MED ORDER — ONDANSETRON HCL 4 MG PO TABS: 4.0000 mg | ORAL_TABLET | Freq: Three times a day (TID) | ORAL | 1 refills | Status: DC | PRN

## 2017-07-30 NOTE — Progress Notes (Signed)
[name]    [id number]    [birth year]  Primary Care Physician:[name], Mechele Claude, MD  Referring Physician: Fanny Bien, MD [address] [place], [place] [address]  Chief complaint:  Nausea, vomiting, anorexia, GERD, constipation  HPI: She had knee replacement surgery 6 weeks ago, she is having worsening constipation with bowel movements every 2-3 days. For past 1 week she is having intermittent nausea and vomiting. Last episode of vomiting 2 days ago. She has decreased appetite.  Reflux symptoms with acid taste in the mouth. Denies any blood per rectum or melena She is taking PPI with intermittent breakthrough heartburn and reflux symptoms. No dysphagia or odynophagia. Abdominal bloating and lower abdominal cramps. No blood per rectum.   Previous HPI: 18 yr F with h/o hiatal hernia and chronic GERD status post Nissen fundoplication 6213 and revision in 2009 with recurrent hiatal hernia, reflux and persistent chronic cough is here for follow-up. Patient reports her cough is worse at bedtime and also she has multiple episodes of cough during the daytime which is affecting her lifestyle. She is following with pulmonary and had a sleep study which did show features of obstructive sleep apnea. He is currently on PPI twice a day before breakfast and bedtime. She works from 3 to 11 PM and always skips dinner every eats after she gets home. She has had multiple upper GI series which show moderate size hiatal hernia and evidence of reflux. Esophageal manometry showed normal esophageal motility with good bolus clearance. Per patient she had a pH study done prior to her first fundoplication. It is not clear why patient had a revision in 2009, surgical report not available in epic but per patient she had a lot of scar tissue and had difficulty with the revision and she couldn't have the complete Nissen fundoplication at the time. Denies any nausea, vomiting, abdominal pain, melena  or bright red blood per rectum  Outpatient Encounter Prescriptions as of 07/30/2017  Medication Sig  . acetaminophen (TYLENOL) 500 MG tablet Take 500-1,000 mg by mouth 2 (two) times daily as needed for mild pain.   . Albuterol Sulfate (PROAIR RESPICLICK) 086 (90 Base) MCG/ACT AEPB Inhale 2 puffs into the lungs every 6 (six) hours as needed. (Patient taking differently: Inhale 2 puffs into the lungs every 6 (six) hours as needed (shortness of breath). )  . bimatoprost (LUMIGAN) 0.03 % ophthalmic solution Place 1 drop into both eyes at bedtime.  . cetirizine (ZYRTEC) 10 MG tablet Take 10 mg by mouth at bedtime.  [name] FLUoxetine (PROZAC) 20 MG tablet Take 20 mg by mouth daily.  [name] HYDROmorphone (DILAUDID) 2 MG tablet Take 1-2 tablets (2-4 mg total) by mouth every 4 (four) hours as needed for severe pain.  [name] losartan (COZAAR) 50 MG tablet Take 50 mg by mouth daily.   . methocarbamol (ROBAXIN) 500 MG tablet Take 1 tablet (500 mg total) by mouth every 6 (six) hours as needed for muscle spasms.  . montelukast (SINGULAIR) 10 MG tablet Take 10 mg by mouth at bedtime.  [name] nystatin (MYCOSTATIN/NYSTOP) powder 1 application topical once a day as needed rash  . nystatin ointment (MYCOSTATIN) 1 APPLICATION TOPICALLY TO AFFECTED AREA 2 TIMES PER DAY AS NEEDED FOR RASH  . omeprazole (PRILOSEC) 40 MG capsule Take 40 mg by mouth daily.   . ranitidine (ZANTAC) 300 MG tablet TAKE 1 TABLET AT BEDTIME AS NEEDED FOR HEARTBURN (Patient taking differently:  TAKE 1 TABLET AT BEDTIME)  . rosuvastatin (CRESTOR) 40 MG tablet Take 40 mg by mouth daily.   . traMADol (ULTRAM) 50 MG tablet Take 1-2 tablets (50-100 mg total) by mouth every 6 (six) hours as needed for moderate pain.  . [DISCONTINUED] rivaroxaban (XARELTO) 10 MG TABS tablet Take 1 tablet (10 mg total) by mouth daily with breakfast. Take Xarelto for two and a half more weeks following discharge from the hospital, then discontinue Xarelto. Once the patient has completed the  blood thinner regimen, then take a Baby 81 mg Aspirin daily for three more weeks.   No facility-administered encounter medications on file as of 07/30/2017.     Allergies as of 07/30/2017 - Review Complete 07/30/2017  Allergen Reaction Noted  . Adalat [nifedipine] Other (See Comments) 09/17/2013  . Hydrocodone Itching and Nausea And Vomiting 09/18/2013  . Meperidine hcl Other (See Comments)   . Naproxen Nausea And Vomiting     Past Medical History:  Diagnosis Date  . Anal or rectal pain    sometimes  . Arthritis   . Bradycardia    " I KNOW I HAVE BRADYCARDIA ESPECIALLY WHEN I SLEEP"   . Chronic back pain   . Degenerative joint disease   . Depression   . Diverticulosis 2003  . Elevated total protein   . Esophageal dysmotility   . Fibromyalgia   . GERD (gastroesophageal reflux disease)    subsequent Nissen Fundoplication  . Glaucoma   . Hearing loss   . Hemorrhoids   . Hiatal hernia 11/08/09  . Hx of adenomatous colonic polyps 07/02/02  . Hypercalcemia   . Hyperlipidemia   . Hyperlipidemia   . Hypertension   . Nausea   . Osteoporosis   . Rectal bleeding    from hemorrhoids.    . Sleep apnea    DOES USE CPAP   . Thrombocytopenia ([place])     Past Surgical History:  Procedure Laterality Date  . CARDIAC CATHETERIZATION  03/14/1992   Normal cardiac cath. Normal LV function.  [name] CARDIOVASCULAR STRESS TEST  01/22/2011   No scintigraphic evidence of inducible ischemia.  [name] CAROTID DOPPLER  03/31/2007   Bilateral ICAs - no evidence of significant diameter reduction, dissectin, tortuosity, FMD, or any other vascular abnormality.  . ESOPHAGEAL MANOMETRY N/A 11/14/2015   Procedure: ESOPHAGEAL MANOMETRY (EM);  Surgeon: Mauri Pole, MD;  Location: [facility];  Service: Endoscopy;  Laterality: N/A;  . GASTRIC RESECTION  2009  . NISSEN FUNDOPLICATION  3474   with subsequent takedown in 2009  . TOTAL KNEE ARTHROPLASTY Left 06/10/2017   Procedure: LEFT  TOTAL KNEE ARTHROPLASTY;   Surgeon: Gaynelle Arabian, MD;  Location: [facility];  Service: Orthopedics;  Laterality: Left;  Adductor Block  . TRANSTHORACIC ECHOCARDIOGRAM  12/21/2010   EF 60%, moderate LVH,     Family History  Problem Relation Age of Onset  . Heart disease Mother   . Hypertension Mother   . Diabetes Mother   . Diabetes Father   . Kidney disease Father   . Breast cancer Daughter   . Stomach cancer Maternal Aunt   . Leukemia Maternal Uncle   . Prostate cancer Maternal Uncle   . Stroke Brother   . Cancer Maternal Grandmother   . Kidney disease Maternal Grandfather   . Uterine cancer Maternal Aunt   . Colon cancer Neg Hx     Social History   Social History  . Marital status: Married    Spouse name: N/A  .  Number of children: 8  . Years of education: N/A   Occupational History  . retired     retired Therapist, art.    Social History Main Topics  . Smoking status: Never Smoker  . Smokeless tobacco: Never Used  . Alcohol use No  . Drug use: No  . Sexual activity: Not on file   Other Topics Concern  . Not on file   Social History Narrative   Husband, Markeeta Scalf is Next of Kin. Cell # [phone]      Review of systems: Review of Systems  Constitutional: Negative for fever and chills.  HENT: Negative.   Eyes: Negative for blurred vision.  Respiratory: Negative for cough, shortness of breath and wheezing.   Cardiovascular: Negative for chest pain and palpitations.  Gastrointestinal: as per HPI Genitourinary: Negative for dysuria, urgency, frequency and hematuria.  Musculoskeletal: Negative for myalgias, back pain and joint pain.  Skin: Negative for itching and rash.  Neurological: Negative for dizziness, tremors, focal weakness, seizures and loss of consciousness.  Endo/Heme/Allergies: Positive for seasonal allergies.  Psychiatric/Behavioral: Negative for depression, suicidal ideas and hallucinations.  All other systems reviewed and are negative.   Physical Exam: Vitals:    07/30/17 1504  BP: 134/82  Pulse: 70   Body mass index is 27.89 kg/m. Gen:      No acute distress HEENT:  EOMI, sclera anicteric Neck:     No masses; no thyromegaly Lungs:    Clear to auscultation bilaterally; normal respiratory effort CV:         Regular rate and rhythm; no murmurs Abd:      + bowel sounds; soft, non-tender; no palpable masses, no distension Ext:    No edema; adequate peripheral perfusion Skin:      Warm and dry; no rash Neuro: alert and oriented x 3 Psych: normal mood and affect  Data Reviewed:  Reviewed labs, radiology imaging, old records and pertinent past GI work up   Assessment and Plan/Recommendations:  42 yr F s/p knee replacement surgery with c/o worsening constipation, nausea and vomiting associated with abdominal pain Will obtain Abdominal x-ray to assess stool burden/partial bowel obstruction Consider bowel purge based on abdominal X- ray findings Start Miralax 1 capful BID Increase fluid intake Zofran 4mg  q12h prn F/u CBC and CMP GERD: Continue PPI and anti reflux measures   25 minutes was spent face-to-face with the patient. Greater than 50% of the time used for counseling as well as treatment plan and follow-up. She had multiple questions which were answered to her satisfaction  K. Denzil Magnuson , MD [phone] Mon-Fri 8a-5p [phone] after 5p, weekends, holidays  CC: Fanny Bien, MD

## 2017-07-30 NOTE — Patient Instructions (Signed)
You have been scheduled for an abdominal ultrasound at [facility] (1st floor of hospital) on 08/06/2017 at 8:45am. Please arrive 15 minutes prior to your appointment for registration. Make certain not to have anything to eat or drink 6 hours prior to your appointment. Should you need to reschedule your appointment, please contact radiology at [phone]. This test typically takes about 30 minutes to perform.  Go to the basement for labs today  Go to the basement today for your Xray  We will send Zofran to your pharmacy today   Use Miralax 1 capful twice a day   You will do a bowel purge pending results of tests

## 2017-07-31 ENCOUNTER — Other Ambulatory Visit: Payer: Self-pay

## 2017-07-31 DIAGNOSIS — R1084 Generalized abdominal pain: Secondary | ICD-10-CM

## 2017-07-31 DIAGNOSIS — R933 Abnormal findings on diagnostic imaging of other parts of digestive tract: Secondary | ICD-10-CM

## 2017-07-31 DIAGNOSIS — K56609 Unspecified intestinal obstruction, unspecified as to partial versus complete obstruction: Secondary | ICD-10-CM

## 2017-07-31 DIAGNOSIS — K5900 Constipation, unspecified: Secondary | ICD-10-CM

## 2017-07-31 DIAGNOSIS — M1712 Unilateral primary osteoarthritis, left knee: Secondary | ICD-10-CM | POA: Diagnosis not present

## 2017-08-01 ENCOUNTER — Ambulatory Visit (INDEPENDENT_AMBULATORY_CARE_PROVIDER_SITE_OTHER)
Admission: RE | Admit: 2017-08-01 | Discharge: 2017-08-01 | Disposition: A | Discharge status: Home / Self Care | Payer: Medicare Other | Source: Ambulatory Visit | Attending: Gastroenterology | Admitting: Gastroenterology

## 2017-08-01 DIAGNOSIS — K5900 Constipation, unspecified: Secondary | ICD-10-CM

## 2017-08-01 DIAGNOSIS — K56609 Unspecified intestinal obstruction, unspecified as to partial versus complete obstruction: Secondary | ICD-10-CM | POA: Diagnosis not present

## 2017-08-01 DIAGNOSIS — R933 Abnormal findings on diagnostic imaging of other parts of digestive tract: Secondary | ICD-10-CM | POA: Diagnosis not present

## 2017-08-01 DIAGNOSIS — K5730 Diverticulosis of large intestine without perforation or abscess without bleeding: Secondary | ICD-10-CM | POA: Diagnosis not present

## 2017-08-01 DIAGNOSIS — R1084 Generalized abdominal pain: Secondary | ICD-10-CM | POA: Diagnosis not present

## 2017-08-01 MED ORDER — IOPAMIDOL (ISOVUE-300) INJECTION 61%
100.0000 mL | Freq: Once | INTRAVENOUS | Status: AC | PRN
  Administered 2017-08-01: 100 mL via INTRAVENOUS

## 2017-08-02 ENCOUNTER — Telehealth: Payer: Self-pay | Admitting: Gastroenterology

## 2017-08-02 DIAGNOSIS — M1712 Unilateral primary osteoarthritis, left knee: Secondary | ICD-10-CM | POA: Diagnosis not present

## 2017-08-02 NOTE — Telephone Encounter (Signed)
-----   Message from Mauri Pole, MD sent at 08/01/2017  5:06 PM EDT ----- Exam unremarkable except for moderate amount of stool. Please advise patient to do bowel purge.

## 2017-08-02 NOTE — Telephone Encounter (Signed)
Left a message to call back for instructions on Miralax purge and other options. DPR is on file.

## 2017-08-05 DIAGNOSIS — M1712 Unilateral primary osteoarthritis, left knee: Secondary | ICD-10-CM | POA: Diagnosis not present

## 2017-08-05 NOTE — Telephone Encounter (Signed)
I don't think I routed this note as I should have. Please advise

## 2017-08-05 NOTE — Telephone Encounter (Signed)
Patient is advised.  

## 2017-08-05 NOTE — Telephone Encounter (Signed)
Continue PRN Zofran, small frequent meals, bowel regimen to prevent constipation and antireflux measures

## 2017-08-05 NOTE — Telephone Encounter (Signed)
Patient states she has purged. She "had good results." She continues to feel queasy.Not hungry but she has not vomited since Friday. She is taking Zofran as ordered. Trying to encourage fluids. Any recommendations?

## 2017-08-06 ENCOUNTER — Ambulatory Visit (HOSPITAL_COMMUNITY): Payer: Medicare Other

## 2017-08-09 DIAGNOSIS — M1712 Unilateral primary osteoarthritis, left knee: Secondary | ICD-10-CM | POA: Diagnosis not present

## 2017-08-12 DIAGNOSIS — M1712 Unilateral primary osteoarthritis, left knee: Secondary | ICD-10-CM | POA: Diagnosis not present

## 2017-08-19 ENCOUNTER — Other Ambulatory Visit: Payer: Self-pay | Admitting: Gastroenterology

## 2017-08-22 DIAGNOSIS — M1712 Unilateral primary osteoarthritis, left knee: Secondary | ICD-10-CM | POA: Diagnosis not present

## 2017-08-22 DIAGNOSIS — Z471 Aftercare following joint replacement surgery: Secondary | ICD-10-CM | POA: Diagnosis not present

## 2017-08-22 DIAGNOSIS — Z96652 Presence of left artificial knee joint: Secondary | ICD-10-CM | POA: Diagnosis not present

## 2017-09-13 DIAGNOSIS — M1712 Unilateral primary osteoarthritis, left knee: Secondary | ICD-10-CM | POA: Diagnosis not present

## 2017-09-13 DIAGNOSIS — M5136 Other intervertebral disc degeneration, lumbar region: Secondary | ICD-10-CM | POA: Diagnosis not present

## 2017-09-13 DIAGNOSIS — M419 Scoliosis, unspecified: Secondary | ICD-10-CM | POA: Diagnosis not present

## 2017-09-27 ENCOUNTER — Ambulatory Visit: Payer: Medicare Other | Admitting: Gastroenterology

## 2017-10-29 DIAGNOSIS — I10 Essential (primary) hypertension: Secondary | ICD-10-CM | POA: Diagnosis not present

## 2017-10-29 DIAGNOSIS — Z79899 Other long term (current) drug therapy: Secondary | ICD-10-CM | POA: Diagnosis not present

## 2017-10-29 DIAGNOSIS — R5383 Other fatigue: Secondary | ICD-10-CM | POA: Diagnosis not present

## 2017-10-29 DIAGNOSIS — E782 Mixed hyperlipidemia: Secondary | ICD-10-CM | POA: Diagnosis not present

## 2017-10-29 DIAGNOSIS — H401133 Primary open-angle glaucoma, bilateral, severe stage: Secondary | ICD-10-CM | POA: Diagnosis not present

## 2017-10-29 DIAGNOSIS — E559 Vitamin D deficiency, unspecified: Secondary | ICD-10-CM | POA: Diagnosis not present

## 2017-11-04 DIAGNOSIS — Z6829 Body mass index (BMI) 29.0-29.9, adult: Secondary | ICD-10-CM | POA: Diagnosis not present

## 2017-11-04 DIAGNOSIS — I10 Essential (primary) hypertension: Secondary | ICD-10-CM | POA: Diagnosis not present

## 2017-11-04 DIAGNOSIS — E782 Mixed hyperlipidemia: Secondary | ICD-10-CM | POA: Diagnosis not present

## 2017-11-04 DIAGNOSIS — F331 Major depressive disorder, recurrent, moderate: Secondary | ICD-10-CM | POA: Diagnosis not present

## 2017-11-15 DIAGNOSIS — M1711 Unilateral primary osteoarthritis, right knee: Secondary | ICD-10-CM | POA: Diagnosis not present

## 2017-11-15 DIAGNOSIS — Z471 Aftercare following joint replacement surgery: Secondary | ICD-10-CM | POA: Diagnosis not present

## 2017-11-15 DIAGNOSIS — Z96652 Presence of left artificial knee joint: Secondary | ICD-10-CM | POA: Diagnosis not present

## 2017-12-09 DIAGNOSIS — Z6830 Body mass index (BMI) 30.0-30.9, adult: Secondary | ICD-10-CM | POA: Diagnosis not present

## 2017-12-09 DIAGNOSIS — R51 Headache: Secondary | ICD-10-CM | POA: Diagnosis not present

## 2017-12-09 DIAGNOSIS — I10 Essential (primary) hypertension: Secondary | ICD-10-CM | POA: Diagnosis not present

## 2017-12-09 DIAGNOSIS — H6123 Impacted cerumen, bilateral: Secondary | ICD-10-CM | POA: Diagnosis not present

## 2017-12-12 ENCOUNTER — Telehealth: Payer: Self-pay | Admitting: *Deleted

## 2017-12-12 MED ORDER — RANITIDINE HCL 300 MG PO TABS: 300.0000 mg | ORAL_TABLET | Freq: Every day | ORAL | 3 refills | Status: DC

## 2017-12-12 NOTE — Telephone Encounter (Signed)
Sent refill request today 90 days for Ranitidine

## 2018-01-08 DIAGNOSIS — G8929 Other chronic pain: Secondary | ICD-10-CM | POA: Diagnosis not present

## 2018-01-08 DIAGNOSIS — M549 Dorsalgia, unspecified: Secondary | ICD-10-CM | POA: Diagnosis not present

## 2018-01-08 DIAGNOSIS — Z6830 Body mass index (BMI) 30.0-30.9, adult: Secondary | ICD-10-CM | POA: Diagnosis not present

## 2018-01-08 DIAGNOSIS — R51 Headache: Secondary | ICD-10-CM | POA: Diagnosis not present

## 2018-01-13 DIAGNOSIS — M4180 Other forms of scoliosis, site unspecified: Secondary | ICD-10-CM | POA: Diagnosis not present

## 2018-01-13 DIAGNOSIS — M4696 Unspecified inflammatory spondylopathy, lumbar region: Secondary | ICD-10-CM | POA: Diagnosis not present

## 2018-01-13 DIAGNOSIS — M48061 Spinal stenosis, lumbar region without neurogenic claudication: Secondary | ICD-10-CM | POA: Diagnosis not present

## 2018-01-15 ENCOUNTER — Encounter (HOSPITAL_COMMUNITY): Payer: Self-pay | Admitting: *Deleted

## 2018-01-15 ENCOUNTER — Emergency Department (HOSPITAL_COMMUNITY)
Admission: EM | Admit: 2018-01-15 | Discharge: 2018-01-15 | Disposition: A | Discharge status: Home / Self Care | Payer: Medicare Other | Attending: Emergency Medicine | Admitting: Emergency Medicine

## 2018-01-15 ENCOUNTER — Other Ambulatory Visit: Payer: Self-pay

## 2018-01-15 DIAGNOSIS — Z96652 Presence of left artificial knee joint: Secondary | ICD-10-CM | POA: Insufficient documentation

## 2018-01-15 DIAGNOSIS — I10 Essential (primary) hypertension: Secondary | ICD-10-CM | POA: Diagnosis not present

## 2018-01-15 DIAGNOSIS — Z79899 Other long term (current) drug therapy: Secondary | ICD-10-CM | POA: Diagnosis not present

## 2018-01-15 DIAGNOSIS — M545 Low back pain: Secondary | ICD-10-CM | POA: Diagnosis present

## 2018-01-15 DIAGNOSIS — M5440 Lumbago with sciatica, unspecified side: Secondary | ICD-10-CM

## 2018-01-15 MED ORDER — METHOCARBAMOL 500 MG PO TABS: 500.0000 mg | ORAL_TABLET | Freq: Two times a day (BID) | ORAL | 0 refills | Status: DC

## 2018-01-15 MED ORDER — OXYCODONE-ACETAMINOPHEN 5-325 MG PO TABS: 1.0000 | ORAL_TABLET | Freq: Four times a day (QID) | ORAL | 0 refills | Status: DC | PRN

## 2018-01-15 MED ORDER — OXYCODONE-ACETAMINOPHEN 5-325 MG PO TABS
1.0000 | ORAL_TABLET | Freq: Once | ORAL | Status: AC
  Administered 2018-01-15: 1 via ORAL
  Filled 2018-01-15: qty 1

## 2018-01-15 MED ORDER — DIAZEPAM 5 MG PO TABS
5.0000 mg | ORAL_TABLET | Freq: Once | ORAL | Status: AC
  Administered 2018-01-15: 5 mg via ORAL
  Filled 2018-01-15: qty 1

## 2018-01-15 NOTE — ED Triage Notes (Signed)
Pt in c/o lower back pain that radiates into her buttocks, pain is worse with position changes, history of same but not this bad, states she saw her ortho MD on Monday and they have an MRI schedule but patient states pain became too much this morning

## 2018-01-15 NOTE — ED Notes (Signed)
Pt ambulated with one assist and her cane approximately 30 ft. Small amount of assistance required, gait slightly unsteady, pain and lightheadedness noted.

## 2018-01-15 NOTE — ED Notes (Signed)
Dr. Marcha Dutton at bedside at this time.

## 2018-01-15 NOTE — Discharge Instructions (Signed)
Return to the ED with any concerns including weakness of legs, not able to urinate, loss of control of bowel or bladder, fever/chills, decreased level of alertness/lethargy, or any other alarming symptoms °

## 2018-01-15 NOTE — ED Provider Notes (Signed)
[facility] EMERGENCY DEPARTMENT Provider Note   CSN: [id number] Arrival date & time: 01/15/18  0931     History   Chief Complaint Chief Complaint  Patient presents with  . Back Pain/chronic    HPI [name] is a 77 y.o. female.  HPI  Patient with history of back pain presents with an increase in her chronic back pain over the past 10 days.  She states the pain is in her lower back on both right and left side and has become worse over the past several days.  She saw an orthopedic surgeon last week who did an x-ray and did not find any fracture and started her on tramadol.  She states the tramadol is not helping with her symptoms.  She is scheduled for an MRI in 5 days for further evaluation of her back pain.  She has no weakness in her legs, no urinary retention, no incontinence of bowel or bladder.  No fever or chills.  No specific injury.  Pain does radiate to behind her knees bilaterally.  Pain is worse with movement and palpation.  No urinary symptoms.  Past Medical History:  Diagnosis Date  . Anal or rectal pain    sometimes  . Arthritis   . Bradycardia    " I KNOW I HAVE BRADYCARDIA ESPECIALLY WHEN I SLEEP"   . Chronic back pain   . Degenerative joint disease   . Depression   . Diverticulosis 2003  . Elevated total protein   . Esophageal dysmotility   . Fibromyalgia   . GERD (gastroesophageal reflux disease)    subsequent Nissen Fundoplication  . Glaucoma   . Hearing loss   . Hemorrhoids   . Hiatal hernia 11/08/09  . Hx of adenomatous colonic polyps 07/02/02  . Hypercalcemia   . Hyperlipidemia   . Hyperlipidemia   . Hypertension   . Nausea   . Osteoporosis   . Rectal bleeding    from hemorrhoids.    . Sleep apnea    DOES USE CPAP   . Thrombocytopenia [facility])     Patient Active Problem List   Diagnosis Date Noted  . Bradycardia 07/27/2016  . Cough   . Essential hypertension 11/24/2014  . Gonalgia 05/13/2013  . Cellulitis 05/13/2013  .  OA (osteoarthritis) of knee 05/13/2013  . Degenerative joint disease involving multiple joints 01/19/2013  . Rheumatoid arthritis ([place]) 01/19/2013  . Thyroid nodule 06/11/2012  . Thrombocytopenia ([place])   . Elevated total protein   . Hypercalcemia   . Cyst of thyroid 10/29/2011  . Hemorrhoids, internal 05/28/2011  . Acid reflux 02/14/2011  . BP (high blood pressure) 02/14/2011  . Avitaminosis D 02/13/2011  . Glaucoma 11/28/2010  . HYPERLIPIDEMIA 12/14/2009  . HEMORRHOIDS 12/14/2009  . DIVERTICULOSIS, COLON 12/14/2009  . HIP PAIN, CHRONIC 12/14/2009  . BACK PAIN, CHRONIC 12/14/2009  . FIBROMYALGIA 12/14/2009  . OSTEOPOROSIS 12/14/2009  . SLEEP APNEA 12/14/2009  . COLONIC POLYPS, ADENOMATOUS, HX OF 12/14/2009  . GASTRIC POLYP, HX OF 12/14/2009  . HIATAL HERNIA 10/04/2009  . DEPRESSION 09/29/2009  . REFLUX ESOPHAGITIS 09/29/2009  . GERD 09/29/2009  . CONSTIPATION 09/29/2009  . DYSPHAGIA 09/29/2009    Past Surgical History:  Procedure Laterality Date  . CARDIAC CATHETERIZATION  03/14/1992   Normal cardiac cath. Normal LV function.  [name] CARDIOVASCULAR STRESS TEST  01/22/2011   No scintigraphic evidence of inducible ischemia.  [name] CAROTID DOPPLER  03/31/2007   Bilateral ICAs - no evidence of significant diameter  reduction, dissectin, tortuosity, FMD, or any other vascular abnormality.  . ESOPHAGEAL MANOMETRY N/A 11/14/2015   Procedure: ESOPHAGEAL MANOMETRY (EM);  Surgeon: Mauri Pole, MD;  Location: [facility] ENDOSCOPY;  Service: Endoscopy;  Laterality: N/A;  . GASTRIC RESECTION  2009  . NISSEN FUNDOPLICATION  5009   with subsequent takedown in 2009  . TOTAL KNEE ARTHROPLASTY Left 06/10/2017   Procedure: LEFT  TOTAL KNEE ARTHROPLASTY;  Surgeon: Gaynelle Arabian, MD;  Location: [facility] ORS;  Service: Orthopedics;  Laterality: Left;  Adductor Block  . TRANSTHORACIC ECHOCARDIOGRAM  12/21/2010   EF 60%, moderate LVH,      OB History   None      Home Medications    Prior to Admission  medications   Medication Sig Start Date End Date Taking? Authorizing Provider  acetaminophen (TYLENOL) 500 MG tablet Take 500-1,000 mg by mouth 2 (two) times daily as needed for mild pain.     [provider]  Albuterol Sulfate (PROAIR RESPICLICK) 381 (90 Base) MCG/ACT AEPB Inhale 2 puffs into the lungs every 6 (six) hours as needed. Patient taking differently: Inhale 2 puffs into the lungs every 6 (six) hours as needed (shortness of breath).  05/02/16   Mannam, Praveen, MD  bimatoprost (LUMIGAN) 0.03 % ophthalmic solution Place 1 drop into both eyes at bedtime.    [provider]  cetirizine (ZYRTEC) 10 MG tablet Take 10 mg by mouth at bedtime.    [provider]  FLUoxetine (PROZAC) 20 MG tablet Take 20 mg by mouth daily.    [provider]  HYDROmorphone (DILAUDID) 2 MG tablet Take 1-2 tablets (2-4 mg total) by mouth every 4 (four) hours as needed for severe pain. 06/11/17   Perkins, Alexzandrew L, PA-C  losartan (COZAAR) 50 MG tablet Take 50 mg by mouth daily.     [provider]  methocarbamol (ROBAXIN) 500 MG tablet Take 1 tablet (500 mg total) by mouth 2 (two) times daily. 01/15/18   Mabe, Forbes Cellar, MD  montelukast (SINGULAIR) 10 MG tablet Take 10 mg by mouth at bedtime.    [provider]  nystatin (MYCOSTATIN/NYSTOP) powder 1 application topical once a day as needed rash 11/20/16   [provider]  nystatin ointment (MYCOSTATIN) 1 APPLICATION TOPICALLY TO AFFECTED AREA 2 TIMES PER DAY AS NEEDED FOR RASH 11/20/16   [provider]  omeprazole (PRILOSEC) 40 MG capsule Take 40 mg by mouth daily.     [provider]  ondansetron (ZOFRAN) 4 MG tablet Take 1 tablet (4 mg total) by mouth every 8 (eight) hours as needed for nausea or vomiting. 07/30/17   Mauri Pole, MD  oxyCODONE-acetaminophen (PERCOCET/ROXICET) 5-325 MG tablet Take 1-2 tablets by mouth every 6 (six) hours as needed for severe pain. 01/15/18   Mabe,  Forbes Cellar, MD  ranitidine (ZANTAC) 300 MG tablet Take 1 tablet (300 mg total) by mouth at bedtime. 12/12/17   Mauri Pole, MD  rosuvastatin (CRESTOR) 40 MG tablet Take 40 mg by mouth daily.     [provider]  traMADol (ULTRAM) 50 MG tablet Take 1-2 tablets (50-100 mg total) by mouth every 6 (six) hours as needed for moderate pain. 06/11/17   Perkins, Alexzandrew L, PA-C    Family History Family History  Problem Relation Age of Onset  . Heart disease Mother   . Hypertension Mother   . Diabetes Mother   . Diabetes Father   . Kidney disease Father   . Breast cancer Daughter   .  Stomach cancer Maternal Aunt   . Leukemia Maternal Uncle   . Prostate cancer Maternal Uncle   . Stroke Brother   . Cancer Maternal Grandmother   . Kidney disease Maternal Grandfather   . Uterine cancer Maternal Aunt   . Colon cancer Neg Hx     Social History Social History   Tobacco Use  . Smoking status: Never Smoker  . Smokeless tobacco: Never Used  Substance Use Topics  . Alcohol use: No    Alcohol/week: 0.0 oz  . Drug use: No     Allergies   Adalat [nifedipine]; Hydrocodone; Meperidine hcl; and Naproxen   Review of Systems Review of Systems  ROS reviewed and all otherwise negative except for mentioned in HPI   Physical Exam Updated Vital Signs BP (!) 124/55 (BP Location: Right Arm)   Pulse (!) 52   Temp 98.2 F (36.8 C) (Oral)   Resp 16   SpO2 100%  Vitals reviewed Physical Exam  Physical Examination: General appearance - alert, well appearing, and in no distress Mental status - alert, oriented to person, place, and time Eyes - no conjunctival injection, no scleral icterus Chest - clear to auscultation, no wheezes, rales or rhonchi, symmetric air entry Heart - normal rate, regular rhythm, normal S1, S2, no murmurs, rubs, clicks or gallops Back exam - ttp over lumbar region and bilateral lumbar paraspinal region, no CVA tenderness Neurological - alert, oriented,  normal speech, strength 5/5 in extremities x 4, sensation intact Musculoskeletal - no joint tenderness, deformity or swelling Extremities - peripheral pulses normal, no pedal edema, no clubbing or cyanosis Skin - normal coloration and turgor, no rashes   ED Treatments / Results  Labs (all labs ordered are listed, but only abnormal results are displayed) Labs Reviewed - No data to display  EKG None  Radiology No results found.  Procedures Procedures (including critical care time)  Medications Ordered in ED Medications  oxyCODONE-acetaminophen (PERCOCET/ROXICET) 5-325 MG per tablet 1 tablet (1 tablet Oral Given 01/15/18 1210)  diazepam (VALIUM) tablet 5 mg (5 mg Oral Given 01/15/18 1210)     Initial Impression / Assessment and Plan / ED Course  I have reviewed the triage vital signs and the nursing notes.  Pertinent labs & imaging results that were available during my care of the patient were reviewed by me and considered in my medical decision making (see chart for details).     Patient presenting with exacerbation of low back pain over the past 10 days.  She has been taking tramadol prescribed by her orthopedic surgeon but this is not helping her pain.  She has no signs or symptoms of cauda equina.  She has no fever to suggest epidural abscess.  She had a plain film last week at her orthopedic surgeon's office and this was negative for fracture.  She is scheduled for MRI on an outpatient basis.  Patient treated with Percocet and Valium in the ED with some relief in her symptoms.  Discharged with prescription for Robaxin and oxycodone and encouraged to keep the appointment for the MRI on Monday.  Discharged with strict return precautions.  Pt agreeable with plan.  Final Clinical Impressions(s) / ED Diagnoses   Final diagnoses:  Low back pain with sciatica, sciatica laterality unspecified, unspecified back pain laterality, unspecified chronicity    ED Discharge Orders         Ordered    oxyCODONE-acetaminophen (PERCOCET/ROXICET) 5-325 MG tablet  Every 6 hours PRN  01/15/18 1333    methocarbamol (ROBAXIN) 500 MG tablet  2 times daily     01/15/18 1333       Mabe, Forbes Cellar, MD 01/15/18 1435

## 2018-01-20 DIAGNOSIS — M545 Low back pain: Secondary | ICD-10-CM | POA: Diagnosis not present

## 2018-01-27 DIAGNOSIS — H401133 Primary open-angle glaucoma, bilateral, severe stage: Secondary | ICD-10-CM | POA: Diagnosis not present

## 2018-02-04 DIAGNOSIS — M4186 Other forms of scoliosis, lumbar region: Secondary | ICD-10-CM | POA: Diagnosis not present

## 2018-02-04 DIAGNOSIS — M4310 Spondylolisthesis, site unspecified: Secondary | ICD-10-CM | POA: Diagnosis not present

## 2018-02-04 DIAGNOSIS — M419 Scoliosis, unspecified: Secondary | ICD-10-CM | POA: Insufficient documentation

## 2018-02-04 DIAGNOSIS — M5136 Other intervertebral disc degeneration, lumbar region: Secondary | ICD-10-CM | POA: Insufficient documentation

## 2018-02-05 DIAGNOSIS — R739 Hyperglycemia, unspecified: Secondary | ICD-10-CM | POA: Diagnosis not present

## 2018-02-05 DIAGNOSIS — I10 Essential (primary) hypertension: Secondary | ICD-10-CM | POA: Diagnosis not present

## 2018-02-05 DIAGNOSIS — Z79899 Other long term (current) drug therapy: Secondary | ICD-10-CM | POA: Diagnosis not present

## 2018-02-05 DIAGNOSIS — E782 Mixed hyperlipidemia: Secondary | ICD-10-CM | POA: Diagnosis not present

## 2018-02-05 DIAGNOSIS — E559 Vitamin D deficiency, unspecified: Secondary | ICD-10-CM | POA: Diagnosis not present

## 2018-02-12 DIAGNOSIS — E782 Mixed hyperlipidemia: Secondary | ICD-10-CM | POA: Diagnosis not present

## 2018-02-12 DIAGNOSIS — R739 Hyperglycemia, unspecified: Secondary | ICD-10-CM | POA: Diagnosis not present

## 2018-02-12 DIAGNOSIS — Z6830 Body mass index (BMI) 30.0-30.9, adult: Secondary | ICD-10-CM | POA: Diagnosis not present

## 2018-02-12 DIAGNOSIS — J309 Allergic rhinitis, unspecified: Secondary | ICD-10-CM | POA: Diagnosis not present

## 2018-02-12 DIAGNOSIS — I10 Essential (primary) hypertension: Secondary | ICD-10-CM | POA: Diagnosis not present

## 2018-02-12 DIAGNOSIS — E1165 Type 2 diabetes mellitus with hyperglycemia: Secondary | ICD-10-CM | POA: Diagnosis not present

## 2018-04-10 DIAGNOSIS — H401133 Primary open-angle glaucoma, bilateral, severe stage: Secondary | ICD-10-CM | POA: Diagnosis not present

## 2018-04-16 DIAGNOSIS — Z6828 Body mass index (BMI) 28.0-28.9, adult: Secondary | ICD-10-CM | POA: Diagnosis not present

## 2018-04-16 DIAGNOSIS — R739 Hyperglycemia, unspecified: Secondary | ICD-10-CM | POA: Diagnosis not present

## 2018-04-16 DIAGNOSIS — Z6829 Body mass index (BMI) 29.0-29.9, adult: Secondary | ICD-10-CM | POA: Diagnosis not present

## 2018-04-16 DIAGNOSIS — K219 Gastro-esophageal reflux disease without esophagitis: Secondary | ICD-10-CM | POA: Diagnosis not present

## 2018-04-16 DIAGNOSIS — E119 Type 2 diabetes mellitus without complications: Secondary | ICD-10-CM | POA: Diagnosis not present

## 2018-05-07 ENCOUNTER — Telehealth: Payer: Self-pay | Admitting: *Deleted

## 2018-05-07 NOTE — Telephone Encounter (Signed)
   [facility] Pre-operative Risk Assessment    Request for surgical clearance:  1. What type of surgery is being performed? RIGHT TOTAL KNEE ARTHROPLASTY    2. When is this surgery scheduled? 07/03/18   3. What type of clearance is required (medical clearance vs. Pharmacy clearance to hold med vs. Both)? MEDICAL   4. Are there any medications that need to be held prior to surgery and how long?   5. Practice name and name of physician performing surgery? [facility]    6. What is your office phone number? [phone]    7.   What is your office fax number? [place]   8.   Anesthesia type (None, local, MAC, general) ? CHOICE

## 2018-05-09 NOTE — Telephone Encounter (Signed)
   Primary Cardiologist: Dr Gwenlyn Found  Chart reviewed as part of pre-operative protocol coverage. Because of [name]'s past medical history and time since last visit, he/she will require a follow-up visit in order to better assess preoperative cardiovascular risk.  Pre-op covering staff: - Please schedule appointment and call patient to inform them. - Please contact requesting surgeon's office via preferred method (i.e, phone, fax) to inform them of need for appointment prior to surgery.  Kerin Ransom, PA-C  05/09/2018, 11:07 AM

## 2018-05-09 NOTE — Telephone Encounter (Signed)
Left message for patient to contact office.

## 2018-05-12 ENCOUNTER — Telehealth: Payer: Self-pay | Admitting: Cardiovascular Disease

## 2018-05-12 NOTE — Telephone Encounter (Signed)
Left message to call the office to schedule an appt for surgery clearance.

## 2018-05-13 NOTE — Telephone Encounter (Signed)
Pt scheduled to see Dr. Gwenlyn Found 05/21/18 for cardiac clearance. Will close this encounter and clear out of preop call back.

## 2018-05-15 DIAGNOSIS — M1712 Unilateral primary osteoarthritis, left knee: Secondary | ICD-10-CM | POA: Diagnosis not present

## 2018-05-15 DIAGNOSIS — Z471 Aftercare following joint replacement surgery: Secondary | ICD-10-CM | POA: Diagnosis not present

## 2018-05-15 DIAGNOSIS — Z96652 Presence of left artificial knee joint: Secondary | ICD-10-CM | POA: Diagnosis not present

## 2018-05-19 ENCOUNTER — Other Ambulatory Visit: Payer: Self-pay

## 2018-05-21 ENCOUNTER — Ambulatory Visit: Payer: Medicare Other | Admitting: Cardiovascular Disease

## 2018-05-21 ENCOUNTER — Ambulatory Visit (INDEPENDENT_AMBULATORY_CARE_PROVIDER_SITE_OTHER): Payer: Medicare Other | Admitting: Cardiovascular Disease

## 2018-05-21 ENCOUNTER — Encounter: Payer: Self-pay | Admitting: Cardiovascular Disease

## 2018-05-21 VITALS — BP 132/76 | HR 60

## 2018-05-21 DIAGNOSIS — Z01818 Encounter for other preprocedural examination: Secondary | ICD-10-CM | POA: Insufficient documentation

## 2018-05-21 DIAGNOSIS — I10 Essential (primary) hypertension: Secondary | ICD-10-CM | POA: Diagnosis not present

## 2018-05-21 NOTE — Assessment & Plan Note (Signed)
History of hyperlipidemia on statin therapy followed by her PCP. 

## 2018-05-21 NOTE — Patient Instructions (Signed)
Your physician wants you to follow-up in: ONE YEAR WITH DR BERRY You will receive a reminder letter in the mail two months in advance. If you don't receive a letter, please call our office to schedule the follow-up appointment.   If you need a refill on your cardiac medications before your next appointment, please call your pharmacy.  

## 2018-05-21 NOTE — Assessment & Plan Note (Signed)
[name] was referred back for preoperative clearance before elective right total knee replacement on September 9 by Dr. Maureen Ralphs.  She had an uncomplicated left total knee replacement last year.  She did have a complete normal cardiac catheterization performed by Dr. Melvern Banker 03/14/1992 and a low risk nonischemic Myoview 06/01/2015.  She is completely asymptomatic.  She will be cleared at low risk without functional testing.

## 2018-05-21 NOTE — Progress Notes (Signed)
05/21/2018 [place]   [birth year]  [id number]  Primary Physician Cheryl Bien, MD Primary Cardiologist: Cheryl Harp MD FACP, [name], [place], [name]  HPI:  [name] is a 77 y.o.  mildly overweight, married [name], mother of 33, grandmother to 33 grandchildren, whom I last saw on   07/27/2016.  She is accompanied by her husband [name] today she is referred back today for preoperative clearance before elective right total knee replacement scheduled to be performed by Dr. Maureen Bates on September 9.  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  She has a history of hyperlipidemia. She was admitted to [facility] on [facility] 20, 2010, with presyncope. Her symptoms were heralded by an episode of nausea. Her other history is remarkable for degenerative joint disease, depression, and fibromyalgia as well as esophageal dysmotility, status post Nissen fundoplication in the past. A 2D echocardiogram was normal. She was complaining of some chest pain at that time, and a Myoview stress test performed in our office on January 22, 2011, was normal as well.  An event monitor was performed  That showed heart rates in the 40s especially while she was asleep.[name] Recently she's been symptomatic from this. She is on Timalol eyeddrops. . [name] follows her blood work including lipid profile.  Since I saw her 2 years ago she is remained asymptomatic.  She denies chest pain or shortness of breath.    Current Meds  Medication Sig  . acetaminophen (TYLENOL) 500 MG tablet Take 500-1,000 mg by mouth 2 (two) times daily as needed for mild pain.   . Albuterol Sulfate (PROAIR RESPICLICK) 824 (90 Base) MCG/ACT AEPB Inhale 2 puffs into the lungs every 6 (six) hours as needed. (Patient taking differently: Inhale 2 puffs into the lungs every 6 (six) hours as needed (shortness of breath). )  . bimatoprost (LUMIGAN) 0.03 % ophthalmic solution Place 1 drop into both eyes at bedtime.  . cetirizine  (ZYRTEC) 10 MG tablet Take 10 mg by mouth at bedtime.  [name] FLUoxetine (PROZAC) 20 MG tablet Take 20 mg by mouth daily.  [name] HYDROmorphone (DILAUDID) 2 MG tablet Take 1-2 tablets (2-4 mg total) by mouth every 4 (four) hours as needed for severe pain.  [name] losartan (COZAAR) 50 MG tablet Take 50 mg by mouth daily.   . methocarbamol (ROBAXIN) 500 MG tablet Take 1 tablet (500 mg total) by mouth 2 (two) times daily.  . montelukast (SINGULAIR) 10 MG tablet Take 10 mg by mouth at bedtime.  [name] nystatin (MYCOSTATIN/NYSTOP) powder 1 application topical once a day as needed rash  . nystatin ointment (MYCOSTATIN) 1 APPLICATION TOPICALLY TO AFFECTED AREA 2 TIMES PER DAY AS NEEDED FOR RASH  . omeprazole (PRILOSEC) 40 MG capsule Take 40 mg by mouth daily.   . ondansetron (ZOFRAN) 4 MG tablet Take 1 tablet (4 mg total) by mouth every 8 (eight) hours as needed for nausea or vomiting.  . ranitidine (ZANTAC) 300 MG tablet Take 1 tablet (300 mg total) by mouth at bedtime.  . rosuvastatin (CRESTOR) 40 MG tablet Take 40 mg by mouth daily.      Allergies  Allergen Reactions  . Adalat [Nifedipine] Other (See Comments)    Unknown  . Hydrocodone Itching and Nausea And Vomiting  . Meperidine Hcl Other (See Comments)    Demerol causes hallucinations  . Naproxen Nausea And Vomiting    Social History   Socioeconomic History  . Marital status: Married  Spouse name: Not on file  . Number of children: 8  . Years of education: Not on file  . Highest education level: Not on file  Occupational History  . Occupation: retired    Comment: retired Therapist, art.   Social Needs  . Financial resource strain: Not on file  . Food insecurity:    Worry: Not on file    Inability: Not on file  . Transportation needs:    Medical: Not on file    Non-medical: Not on file  Tobacco Use  . Smoking status: Never Smoker  . Smokeless tobacco: Never Used  Substance and Sexual Activity  . Alcohol use: No    Alcohol/week: 0.0 oz    . Drug use: No  . Sexual activity: Not on file  Lifestyle  . Physical activity:    Days per week: Not on file    Minutes per session: Not on file  . Stress: Not on file  Relationships  . Social connections:    Talks on phone: Not on file    Gets together: Not on file    Attends religious service: Not on file    Active member of club or organization: Not on file    Attends meetings of clubs or organizations: Not on file    Relationship status: Not on file  . Intimate partner violence:    Fear of current or ex partner: Not on file    Emotionally abused: Not on file    Physically abused: Not on file    Forced sexual activity: Not on file  Other Topics Concern  . Not on file  Social History Narrative   Husband, [name] is Next of Kin. Cell # [phone]     Review of Systems: General: negative for chills, fever, night sweats or weight changes.  Cardiovascular: negative for chest pain, dyspnea on exertion, edema, orthopnea, palpitations, paroxysmal nocturnal dyspnea or shortness of breath Dermatological: negative for rash Respiratory: negative for cough or wheezing Urologic: negative for hematuria Abdominal: negative for nausea, vomiting, diarrhea, bright red blood per rectum, melena, or hematemesis Neurologic: negative for visual changes, syncope, or dizziness All other systems reviewed and are otherwise negative except as noted above.    Blood pressure 132/76, pulse 60.  General appearance: alert and no distress Neck: no adenopathy, no carotid bruit, no JVD, supple, symmetrical, trachea midline and thyroid not enlarged, symmetric, no tenderness/mass/nodules Lungs: clear to auscultation bilaterally Heart: regular rate and rhythm, S1, S2 normal, no murmur, click, rub or gallop Extremities: extremities normal, atraumatic, no cyanosis or edema Pulses: 2+ and symmetric Skin: Skin color, texture, turgor normal. No rashes or lesions Neurologic: Alert and oriented X 3, normal  strength and tone. Normal symmetric reflexes. Normal coordination and gait  EKG sinus rhythm at 60 with borderline evidence of LVH by voltage criteria.  I personally reviewed this EKG.  ASSESSMENT AND PLAN:   HYPERLIPIDEMIA History of hyperlipidemia on statin therapy followed by her PCP  Essential hypertension History of essential hypertension on losartan with blood pressure measured at 132/76.  Preoperative clearance Ms. Messamore was referred back for preoperative clearance before elective right total knee replacement on September 9 by Dr. Maureen Bates.  She had an uncomplicated left total knee replacement last year.  She did have a complete normal cardiac catheterization performed by Dr. Melvern Banker 03/14/1992 and a low risk nonischemic Myoview 06/01/2015.  She is completely asymptomatic.  She will be cleared at low risk without functional testing.  Cheryl Harp MD FACP,FACC,FAHA, [facility] 05/21/2018 9:28 AM

## 2018-05-21 NOTE — Assessment & Plan Note (Signed)
History of essential hypertension on losartan with blood pressure measured at 132/76.

## 2018-05-22 DIAGNOSIS — Z01818 Encounter for other preprocedural examination: Secondary | ICD-10-CM | POA: Diagnosis not present

## 2018-05-22 DIAGNOSIS — I10 Essential (primary) hypertension: Secondary | ICD-10-CM | POA: Diagnosis not present

## 2018-05-22 DIAGNOSIS — M1711 Unilateral primary osteoarthritis, right knee: Secondary | ICD-10-CM | POA: Diagnosis not present

## 2018-05-22 DIAGNOSIS — Z6829 Body mass index (BMI) 29.0-29.9, adult: Secondary | ICD-10-CM | POA: Diagnosis not present

## 2018-06-04 DIAGNOSIS — Z0000 Encounter for general adult medical examination without abnormal findings: Secondary | ICD-10-CM | POA: Diagnosis not present

## 2018-06-04 DIAGNOSIS — M722 Plantar fascial fibromatosis: Secondary | ICD-10-CM | POA: Diagnosis not present

## 2018-06-04 DIAGNOSIS — Z23 Encounter for immunization: Secondary | ICD-10-CM | POA: Diagnosis not present

## 2018-06-04 DIAGNOSIS — Z6829 Body mass index (BMI) 29.0-29.9, adult: Secondary | ICD-10-CM | POA: Diagnosis not present

## 2018-06-05 ENCOUNTER — Other Ambulatory Visit: Payer: Self-pay | Admitting: Orthopedic Surgery

## 2018-06-05 NOTE — Care Plan (Signed)
R TKA scheduled on 06-23-18 DCP:  Home with spouse.  1 story home with 2 ste. DME:  No needs.  Has a RW and 3-in-1 PT:  EO.  PT eval scheduled on 06-27-18.

## 2018-06-12 NOTE — Progress Notes (Signed)
Clearance Dr. Gwenlyn Found Cardiology 05-27-18 chart   EKG 05-21-18 epic

## 2018-06-12 NOTE — Patient Instructions (Addendum)
[place]  06/12/2018   Your procedure is scheduled on: 06-23-18  Report to [facility] Main  Entrance  Report to admitting at    0900  AM    Call this number if you have problems the morning of surgery [phone]   Remember: Do not eat food or drink liquids :After Midnight.     Take these medicines the morning of surgery with A SIP OF WATER: crestor , omeprazole, prozac, tylenol if needed, inhaler and bring                                You may not have any metal on your body including hair pins and              piercings  Do not wear jewelry, make-up, lotions, powders or perfumes, deodorant             Do not wear nail polish.  Do not shave  48 hours prior to surgery.  .   Do not bring valuables to the hospital. [place].  Contacts, dentures or bridgework may not be worn into surgery.  Leave suitcase in the car. After surgery it may be brought to your room.                  Please read over the following fact sheets you were given: _____________________________________________________________________         [facility] - Preparing for Surgery Before surgery, you can play an important role.  Because skin is not sterile, your skin needs to be as free of germs as possible.  You can reduce the number of germs on your skin by washing with CHG (chlorahexidine gluconate) soap before surgery.  CHG is an antiseptic cleaner which kills germs and bonds with the skin to continue killing germs even after washing. Please DO NOT use if you have an allergy to CHG or antibacterial soaps.  If your skin becomes reddened/irritated stop using the CHG and inform your nurse when you arrive at Short Stay. Do not shave (including legs and underarms) for at least 48 hours prior to the first CHG shower.  You may shave your face/neck. Please follow these instructions carefully:  1.  Shower with CHG Soap the night before surgery  and the  morning of Surgery.  2.  If you choose to wash your hair, wash your hair first as usual with your  normal  shampoo.  3.  After you shampoo, rinse your hair and body thoroughly to remove the  shampoo.                           4.  Use CHG as you would any other liquid soap.  You can apply chg directly  to the skin and wash                       Gently with a scrungie or clean washcloth.  5.  Apply the CHG Soap to your body ONLY FROM THE NECK DOWN.   Do not use on face/ open  Wound or open sores. Avoid contact with eyes, ears mouth and genitals (private parts).                       Wash face,  Genitals (private parts) with your normal soap.             6.  Wash thoroughly, paying special attention to the area where your surgery  will be performed.  7.  Thoroughly rinse your body with warm water from the neck down.  8.  DO NOT shower/wash with your normal soap after using and rinsing off  the CHG Soap.                9.  Pat yourself dry with a clean towel.            10.  Wear clean pajamas.            11.  Place clean sheets on your bed the night of your first shower and do not  sleep with pets. Day of Surgery : Do not apply any lotions/deodorants the morning of surgery.  Please wear clean clothes to the hospital/surgery center.  FAILURE TO FOLLOW THESE INSTRUCTIONS MAY RESULT IN THE CANCELLATION OF YOUR SURGERY PATIENT SIGNATURE_________________________________  NURSE SIGNATURE__________________________________  ________________________________________________________________________  WHAT IS A BLOOD TRANSFUSION? Blood Transfusion Information  A transfusion is the replacement of blood or some of its parts. Blood is made up of multiple cells which provide different functions.  Red blood cells carry oxygen and are used for blood loss replacement.  White blood cells fight against infection.  Platelets control bleeding.  Plasma helps clot  blood.  Other blood products are available for specialized needs, such as hemophilia or other clotting disorders. BEFORE THE TRANSFUSION  Who gives blood for transfusions?   Healthy volunteers who are fully evaluated to make sure their blood is safe. This is blood bank blood. Transfusion therapy is the safest it has ever been in the practice of medicine. Before blood is taken from a donor, a complete history is taken to make sure that person has no history of diseases nor engages in risky social behavior (examples are intravenous drug use or sexual activity with multiple partners). The donor's travel history is screened to minimize risk of transmitting infections, such as malaria. The donated blood is tested for signs of infectious diseases, such as HIV and hepatitis. The blood is then tested to be sure it is compatible with you in order to minimize the chance of a transfusion reaction. If you or a relative donates blood, this is often done in anticipation of surgery and is not appropriate for emergency situations. It takes many days to process the donated blood. RISKS AND COMPLICATIONS Although transfusion therapy is very safe and saves many lives, the main dangers of transfusion include:   Getting an infectious disease.  Developing a transfusion reaction. This is an allergic reaction to something in the blood you were given. Every precaution is taken to prevent this. The decision to have a blood transfusion has been considered carefully by your caregiver before blood is given. Blood is not given unless the benefits outweigh the risks. AFTER THE TRANSFUSION  Right after receiving a blood transfusion, you will usually feel much better and more energetic. This is especially true if your red blood cells have gotten low (anemic). The transfusion raises the level of the red blood cells which carry oxygen, and this usually causes an energy increase.  The  nurse administering the transfusion will monitor  you carefully for complications. HOME CARE INSTRUCTIONS  No special instructions are needed after a transfusion. You may find your energy is better. Speak with your caregiver about any limitations on activity for underlying diseases you may have. SEEK MEDICAL CARE IF:   Your condition is not improving after your transfusion.  You develop redness or irritation at the intravenous (IV) site. SEEK IMMEDIATE MEDICAL CARE IF:  Any of the following symptoms occur over the next 12 hours:  Shaking chills.  You have a temperature by mouth above 102 F (38.9 C), not controlled by medicine.  Chest, back, or muscle pain.  People around you feel you are not acting correctly or are confused.  Shortness of breath or difficulty breathing.  Dizziness and fainting.  You get a rash or develop hives.  You have a decrease in urine output.  Your urine turns a dark color or changes to pink, red, or brown. Any of the following symptoms occur over the next 10 days:  You have a temperature by mouth above 102 F (38.9 C), not controlled by medicine.  Shortness of breath.  Weakness after normal activity.  The white part of the eye turns yellow (jaundice).  You have a decrease in the amount of urine or are urinating less often.  Your urine turns a dark color or changes to pink, red, or brown. Document Released: 09/28/2000 Document Revised: 12/24/2011 Document Reviewed: 05/17/2008 ExitCare Patient Information 2014 [place].  _______________________________________________________________________  Incentive Spirometer  An incentive spirometer is a tool that can help keep your lungs clear and active. This tool measures how well you are filling your lungs with each breath. Taking long deep breaths may help reverse or decrease the chance of developing breathing (pulmonary) problems (especially infection) following:  A long period of time when you are unable to move or be active. BEFORE THE  PROCEDURE   If the spirometer includes an indicator to show your best effort, your nurse or respiratory therapist will set it to a desired goal.  If possible, sit up straight or lean slightly forward. Try not to slouch.  Hold the incentive spirometer in an upright position. INSTRUCTIONS FOR USE  1. Sit on the edge of your bed if possible, or sit up as far as you can in bed or on a chair. 2. Hold the incentive spirometer in an upright position. 3. Breathe out normally. 4. Place the mouthpiece in your mouth and seal your lips tightly around it. 5. Breathe in slowly and as deeply as possible, raising the piston or the ball toward the top of the column. 6. Hold your breath for 3-5 seconds or for as long as possible. Allow the piston or ball to fall to the bottom of the column. 7. Remove the mouthpiece from your mouth and breathe out normally. 8. Rest for a few seconds and repeat Steps 1 through 7 at least 10 times every 1-2 hours when you are awake. Take your time and take a few normal breaths between deep breaths. 9. The spirometer may include an indicator to show your best effort. Use the indicator as a goal to work toward during each repetition. 10. After each set of 10 deep breaths, practice coughing to be sure your lungs are clear. If you have an incision (the cut made at the time of surgery), support your incision when coughing by placing a pillow or rolled up towels firmly against it. Once you are able to get  out of bed, walk around indoors and cough well. You may stop using the incentive spirometer when instructed by your caregiver.  RISKS AND COMPLICATIONS  Take your time so you do not get dizzy or light-headed.  If you are in pain, you may need to take or ask for pain medication before doing incentive spirometry. It is harder to take a deep breath if you are having pain. AFTER USE  Rest and breathe slowly and easily.  It can be helpful to keep track of a log of your progress. Your  caregiver can provide you with a simple table to help with this. If you are using the spirometer at home, follow these instructions: [place] IF:   You are having difficultly using the spirometer.  You have trouble using the spirometer as often as instructed.  Your pain medication is not giving enough relief while using the spirometer.  You develop fever of 100.5 F (38.1 C) or higher. SEEK IMMEDIATE MEDICAL CARE IF:   You cough up bloody sputum that had not been present before.  You develop fever of 102 F (38.9 C) or greater.  You develop worsening pain at or near the incision site. MAKE SURE YOU:   Understand these instructions.  Will watch your condition.  Will get help right away if you are not doing well or get worse. Document Released: 02/11/2007 Document Revised: 12/24/2011 Document Reviewed: 04/14/2007 [employer] Patient Information 2014 [place], [place].   ________________________________________________________________________

## 2018-06-18 ENCOUNTER — Encounter (HOSPITAL_COMMUNITY): Payer: Self-pay

## 2018-06-18 ENCOUNTER — Other Ambulatory Visit: Payer: Self-pay

## 2018-06-18 ENCOUNTER — Encounter (HOSPITAL_COMMUNITY)
Admission: RE | Admit: 2018-06-18 | Discharge: 2018-06-18 | Disposition: A | Discharge status: Home / Self Care | Payer: Medicare Other | Source: Ambulatory Visit | Attending: Orthopedic Surgery | Admitting: Orthopedic Surgery

## 2018-06-18 DIAGNOSIS — Z0183 Encounter for blood typing: Secondary | ICD-10-CM | POA: Diagnosis not present

## 2018-06-18 DIAGNOSIS — M1711 Unilateral primary osteoarthritis, right knee: Secondary | ICD-10-CM | POA: Diagnosis not present

## 2018-06-18 DIAGNOSIS — Z01812 Encounter for preprocedural laboratory examination: Secondary | ICD-10-CM | POA: Diagnosis not present

## 2018-06-18 HISTORY — DX: Unspecified asthma, uncomplicated: J45.909

## 2018-06-18 HISTORY — DX: Asymptomatic varicose veins of left lower extremity: I83.92

## 2018-06-18 HISTORY — DX: Other specified postprocedural states: Z98.890

## 2018-06-18 HISTORY — DX: Other specified postprocedural states: R11.2

## 2018-06-18 HISTORY — DX: Anemia, unspecified: D64.9

## 2018-06-18 HISTORY — DX: Nausea with vomiting, unspecified: R11.2

## 2018-06-18 HISTORY — DX: Cardiac arrhythmia, unspecified: I49.9

## 2018-06-18 LAB — COMPREHENSIVE METABOLIC PANEL
ALK PHOS: 73 U/L (ref 38–126)
ALT: 16 U/L (ref 0–44)
ANION GAP: 6 (ref 5–15)
AST: 23 U/L (ref 15–41)
Albumin: 3.7 g/dL (ref 3.5–5.0)
BILIRUBIN TOTAL: 0.6 mg/dL (ref 0.3–1.2)
BUN: 23 mg/dL (ref 8–23)
CO2: 29 mmol/L (ref 22–32)
Calcium: 9.3 mg/dL (ref 8.9–10.3)
Chloride: 106 mmol/L (ref 98–111)
Creatinine, Ser: 0.85 mg/dL (ref 0.44–1.00)
Glucose, Bld: 105 mg/dL — ABNORMAL HIGH (ref 70–99)
Potassium: 4.2 mmol/L (ref 3.5–5.1)
Sodium: 141 mmol/L (ref 135–145)
TOTAL PROTEIN: 7.1 g/dL (ref 6.5–8.1)

## 2018-06-18 LAB — CBC
HEMATOCRIT: 38.3 % (ref 36.0–46.0)
HEMOGLOBIN: 12.2 g/dL (ref 12.0–15.0)
MCH: 30.7 pg (ref 26.0–34.0)
MCHC: 31.9 g/dL (ref 30.0–36.0)
MCV: 96.2 fL (ref 78.0–100.0)
Platelets: 143 10*3/uL — ABNORMAL LOW (ref 150–400)
RBC: 3.98 MIL/uL (ref 3.87–5.11)
RDW: 13.6 % (ref 11.5–15.5)
WBC: 4.7 10*3/uL (ref 4.0–10.5)

## 2018-06-18 LAB — PROTIME-INR
INR: 1
Prothrombin Time: 13.1 seconds (ref 11.4–15.2)

## 2018-06-18 LAB — APTT: aPTT: 28 seconds (ref 24–36)

## 2018-06-18 LAB — SURGICAL PCR SCREEN
MRSA, PCR: NEGATIVE
Staphylococcus aureus: NEGATIVE

## 2018-06-22 MED ORDER — TRANEXAMIC ACID 1000 MG/10ML IV SOLN
2000.0000 mg | INTRAVENOUS | Status: DC
  Filled 2018-06-22: qty 20

## 2018-06-22 MED ORDER — BUPIVACAINE LIPOSOME 1.3 % IJ SUSP
20.0000 mL | Freq: Once | INTRAMUSCULAR | Status: DC
  Filled 2018-06-22: qty 20

## 2018-06-22 NOTE — H&P (Signed)
TOTAL KNEE ADMISSION H&P  Patient is being admitted for right total knee arthroplasty.  Subjective:  Chief Complaint:right knee pain.  HPI: [name], 77 y.o. female, has a history of pain and functional disability in the right knee due to arthritis and has failed non-surgical conservative treatments for greater than 12 weeks to includeNSAID's and/or analgesics, corticosteriod injections, flexibility and strengthening excercises, use of assistive devices and activity modification.  Onset of symptoms was gradual, starting 5 years ago with gradually worsening course since that time. The patient noted no past surgery on the right knee(s).  Patient currently rates pain in the right knee(s) at 7 out of 10 with activity. Patient has night pain, worsening of pain with activity and weight bearing, pain that interferes with activities of daily living, pain with passive range of motion, crepitus and joint swelling.  Patient has evidence of periarticular osteophytes and joint space narrowing by imaging studies. There is no active infection.  Patient Active Problem List   Diagnosis Date Noted  . Preoperative clearance 05/21/2018  . Bradycardia 07/27/2016  . Cough   . Essential hypertension 11/24/2014  . Gonalgia 05/13/2013  . Cellulitis 05/13/2013  . OA (osteoarthritis) of knee 05/13/2013  . Degenerative joint disease involving multiple joints 01/19/2013  . Rheumatoid arthritis ([place]) 01/19/2013  . Thyroid nodule 06/11/2012  . Thrombocytopenia ([place])   . Elevated total protein   . Hypercalcemia   . Cyst of thyroid 10/29/2011  . Hemorrhoids, internal 05/28/2011  . Acid reflux 02/14/2011  . BP (high blood pressure) 02/14/2011  . Avitaminosis D 02/13/2011  . Glaucoma 11/28/2010  . HYPERLIPIDEMIA 12/14/2009  . HEMORRHOIDS 12/14/2009  . DIVERTICULOSIS, COLON 12/14/2009  . HIP PAIN, CHRONIC 12/14/2009  . BACK PAIN, CHRONIC 12/14/2009  . FIBROMYALGIA 12/14/2009  . OSTEOPOROSIS 12/14/2009  . SLEEP  APNEA 12/14/2009  . COLONIC POLYPS, ADENOMATOUS, HX OF 12/14/2009  . GASTRIC POLYP, HX OF 12/14/2009  . HIATAL HERNIA 10/04/2009  . DEPRESSION 09/29/2009  . REFLUX ESOPHAGITIS 09/29/2009  . GERD 09/29/2009  . CONSTIPATION 09/29/2009  . DYSPHAGIA 09/29/2009   Past Medical History:  Diagnosis Date  . Anal or rectal pain    sometimes  . Anemia    hx of during pregnancy  . Arthritis   . Asthma    hx of  . Bradycardia    " I KNOW I HAVE BRADYCARDIA ESPECIALLY WHEN I SLEEP"   . Chronic back pain   . Degenerative joint disease    osteo  . Depression   . Diverticulosis 2003  . Dysrhythmia    hx of  due to eye drop and also low heart rate 40's per pt. Dr. Gwenlyn Found follows  . Elevated total protein   . Esophageal dysmotility   . Fibromyalgia   . GERD (gastroesophageal reflux disease)    subsequent Nissen Fundoplication  . Glaucoma   . Hearing loss   . Hemorrhoids   . Hiatal hernia 11/08/09  . Hx of adenomatous colonic polyps 07/02/02  . Hypercalcemia   . Hyperlipidemia   . Hyperlipidemia   . Hypertension   . Nausea   . Osteoporosis   . PONV (postoperative nausea and vomiting)   . Rectal bleeding    from hemorrhoids.    . Sleep apnea    DOES USE CPAP   . Thrombocytopenia ([place])   . Varicose veins of left lower extremity     Past Surgical History:  Procedure Laterality Date  . CARDIAC CATHETERIZATION  03/14/1992   Normal cardiac cath.  Normal LV function.  [name] CARDIOVASCULAR STRESS TEST  01/22/2011   No scintigraphic evidence of inducible ischemia.  [name] CAROTID DOPPLER  03/31/2007   Bilateral ICAs - no evidence of significant diameter reduction, dissectin, tortuosity, FMD, or any other vascular abnormality.  . ESOPHAGEAL MANOMETRY N/A 11/14/2015   Procedure: ESOPHAGEAL MANOMETRY (EM);  Surgeon: Mauri Pole, MD;  Location: [facility];  Service: Endoscopy;  Laterality: N/A;  . GASTRIC RESECTION  2009  . JOINT REPLACEMENT     right knee Dr. Wynelle Link 06-23-18  . NISSEN  FUNDOPLICATION  1308   with subsequent takedown in 2009  . TOTAL KNEE ARTHROPLASTY Left 06/10/2017   Procedure: LEFT  TOTAL KNEE ARTHROPLASTY;  Surgeon: Gaynelle Arabian, MD;  Location: [facility];  Service: Orthopedics;  Laterality: Left;  Adductor Block  . TRANSTHORACIC ECHOCARDIOGRAM  12/21/2010   EF 60%, moderate LVH,     Current Facility-Administered Medications  Medication Dose Route Frequency Provider Last Rate Last Dose  . [START ON 06/23/2018] bupivacaine liposome (EXPAREL) 1.3 % injection 266 mg  20 mL Other Once Gaynelle Arabian, MD      . Derrill Memo ON 06/23/2018] tranexamic acid (CYKLOKAPRON) 2,000 mg in sodium chloride 0.9 % 50 mL Topical Application  6,578 mg Topical To OR Gaynelle Arabian, MD       Current Outpatient Medications  Medication Sig Dispense Refill Last Dose  . acetaminophen (TYLENOL) 500 MG tablet Take 1,000 mg by mouth every 6 (six) hours as needed for mild pain or headache.    Taking  . Albuterol Sulfate (PROAIR RESPICLICK) 469 (90 Base) MCG/ACT AEPB Inhale 2 puffs into the lungs every 6 (six) hours as needed. (Patient taking differently: Inhale 2 puffs into the lungs every 6 (six) hours as needed (for shortness of breath/wheezing). ) 1 each 5 Taking  . bimatoprost (LUMIGAN) 0.01 % SOLN Place 1 drop into both eyes at bedtime.    Taking  . cetirizine (ZYRTEC) 10 MG tablet Take 10 mg by mouth at bedtime.   Taking  . Cholecalciferol (VITAMIN D3) 5000 units CAPS Take 15,000 Units by mouth daily.     . diclofenac sodium (VOLTAREN) 1 % GEL Apply 4 g topically 4 (four) times daily as needed (for pain).   5   . FLUoxetine (PROZAC) 20 MG tablet Take 20 mg by mouth daily.   Taking  . losartan (COZAAR) 50 MG tablet Take 50 mg by mouth daily.    Taking  . montelukast (SINGULAIR) 10 MG tablet Take 10 mg by mouth at bedtime.   Taking  . nystatin (MYCOSTATIN/NYSTOP) powder Apply 1 g topically daily as needed (for rash).   3 Taking  . nystatin ointment (MYCOSTATIN) Apply 1 application topically 2  (two) times daily as needed (for rash).   0 Taking  . omeprazole (PRILOSEC) 40 MG capsule Take 40 mg by mouth 2 (two) times daily.    Taking  . ranitidine (ZANTAC) 300 MG tablet Take 1 tablet (300 mg total) by mouth at bedtime. 90 tablet 3 Taking  . rosuvastatin (CRESTOR) 40 MG tablet Take 40 mg by mouth daily.    Taking  . HYDROmorphone (DILAUDID) 2 MG tablet Take 1-2 tablets (2-4 mg total) by mouth every 4 (four) hours as needed for severe pain. (Patient not taking: Reported on 06/17/2018) 80 tablet 0 Not Taking at Unknown time  . methocarbamol (ROBAXIN) 500 MG tablet Take 1 tablet (500 mg total) by mouth 2 (two) times daily. (Patient not taking: Reported on 06/17/2018) 20 tablet 0  Not Taking at Unknown time  . ondansetron (ZOFRAN) 4 MG tablet Take 1 tablet (4 mg total) by mouth every 8 (eight) hours as needed for nausea or vomiting. (Patient not taking: Reported on 06/17/2018) 60 tablet 1 Not Taking at Unknown time  . oxyCODONE-acetaminophen (PERCOCET/ROXICET) 5-325 MG tablet Take 1-2 tablets by mouth every 6 (six) hours as needed for severe pain. (Patient not taking: Reported on 05/21/2018) 15 tablet 0 Not Taking at Unknown time  . traMADol (ULTRAM) 50 MG tablet Take 1-2 tablets (50-100 mg total) by mouth every 6 (six) hours as needed for moderate pain. (Patient not taking: Reported on 05/21/2018) 56 tablet 0 Not Taking at Unknown time   Allergies  Allergen Reactions  . Adalat [Nifedipine] Other (See Comments)    Unknown  . Hydrocodone Itching and Nausea And Vomiting  . Meperidine Hcl Other (See Comments)    Demerol causes hallucinations  . Naproxen Nausea And Vomiting    Social History   Tobacco Use  . Smoking status: Never Smoker  . Smokeless tobacco: Never Used  Substance Use Topics  . Alcohol use: No    Alcohol/week: 0.0 standard drinks    Family History  Problem Relation Age of Onset  . Heart disease Mother   . Hypertension Mother   . Diabetes Mother   . Diabetes Father   . Kidney  disease Father   . Breast cancer Daughter   . Stomach cancer Maternal Aunt   . Leukemia Maternal Uncle   . Prostate cancer Maternal Uncle   . Stroke Brother   . Cancer Maternal Grandmother   . Kidney disease Maternal Grandfather   . Uterine cancer Maternal Aunt   . Colon cancer Neg Hx      Review of Systems  Constitutional: Negative.   HENT: Positive for hearing loss. Negative for congestion, ear discharge, ear pain, nosebleeds, sinus pain, sore throat and tinnitus.   Eyes: Negative.   Respiratory: Negative.  Negative for stridor.   Cardiovascular: Negative.   Gastrointestinal: Positive for constipation and heartburn. Negative for abdominal pain, blood in stool, diarrhea, melena, nausea and vomiting.  Genitourinary: Positive for frequency. Negative for dysuria, flank pain, hematuria and urgency.  Musculoskeletal: Positive for back pain, joint pain and myalgias. Negative for falls and neck pain.  Skin: Negative.   Neurological: Negative.   Endo/Heme/Allergies: Negative.   Psychiatric/Behavioral: Negative.     Objective:  Physical Exam  Constitutional: She is oriented to person, place, and time. She appears well-developed. No distress.  Overweight  HENT:  Head: Normocephalic and atraumatic.  Right Ear: External ear normal.  Left Ear: External ear normal.  Nose: Nose normal.  Mouth/Throat: Oropharynx is clear and moist.  Eyes: Conjunctivae and EOM are normal.  Neck: Normal range of motion. Neck supple.  Cardiovascular: Normal rate, regular rhythm, normal heart sounds and intact distal pulses.  No murmur heard. Respiratory: Effort normal and breath sounds normal. No respiratory distress. She has no wheezes.  GI: Soft. Bowel sounds are normal. She exhibits no distension. There is no tenderness.  Musculoskeletal:       Right hip: Normal.       Left hip: Normal.  Musculoskeletal Right Knee Exam: No effusion. Range of motion is 0-130 degrees. Marked crepitus on range of  motion of the knee. Positive medial joint line tenderness. Positive lateral joint line tenderness. Stable knee.  Left knee exam: No effusion or warmth. Range of motion: 3 - 120 degrees. No medial joint line tenderness. No lateral joint  line tenderness. Knee is stable.  Neurological: She is alert and oriented to person, place, and time. She has normal strength. No sensory deficit.  Skin: No rash noted. She is not diaphoretic. No erythema.  Psychiatric: She has a normal mood and affect. Her behavior is normal.    Vital Signs Ht: 5 ft 9 in  Wt: 196 lbs  BMI: 28.9  BP: 128/74 sitting L arm  Pulse: 72 bpm   Imaging Review Plain radiographs demonstrate severe degenerative joint disease of the right knee(s). The overall alignment ismild varus. The bone quality appears to be good for age and reported activity level.   Preoperative templating of the joint replacement has been completed, documented, and submitted to the Operating Room personnel in order to optimize intra-operative equipment management.   Anticipated LOS equal to or greater than 2 midnights due to - Age 66 and older with one or more of the following:  - Obesity  - Expected need for hospital services (PT, OT, Nursing) required for safe  discharge  - Anticipated need for postoperative skilled nursing care or inpatient rehab  - Active co-morbidities: None OR   - Unanticipated findings during/Post Surgery: None  - Patient is a high risk of re-admission due to: None     Assessment/Plan:  End stage primary osteoarthritis, right knee   The patient history, physical examination, clinical judgment of the provider and imaging studies are consistent with end stage degenerative joint disease of the right knee(s) and total knee arthroplasty is deemed medically necessary. The treatment options including medical management, injection therapy arthroscopy and arthroplasty were discussed at length. The risks and benefits of total  knee arthroplasty were presented and reviewed. The risks due to aseptic loosening, infection, stiffness, patella tracking problems, thromboembolic complications and other imponderables were discussed. The patient acknowledged the explanation, agreed to proceed with the plan and consent was signed. Patient is being admitted for inpatient treatment for surgery, pain control, PT, OT, prophylactic antibiotics, VTE prophylaxis, progressive ambulation and ADL's and discharge planning. The patient is planning to be discharged home with VERA therapy.    Therapy Plans: VERA Disposition: Home with husband Planned DVT prophylaxis: aspirin 325mg  BID DME needed: none needed PCP: Dr. Ernie Hew Cardio: Dr. Gwenlyn Found Other: no anesthesia concerns   Ardeen Jourdain, PA-C

## 2018-06-23 ENCOUNTER — Inpatient Hospital Stay (HOSPITAL_COMMUNITY)
Admission: RE | Admit: 2018-06-23 | Discharge: 2018-06-28 | DRG: 470 | Disposition: A | Discharge status: Home / Self Care | Payer: Medicare Other | Attending: Orthopedic Surgery | Admitting: Orthopedic Surgery

## 2018-06-23 ENCOUNTER — Encounter (HOSPITAL_COMMUNITY): Payer: Self-pay | Admitting: Emergency Medicine

## 2018-06-23 ENCOUNTER — Encounter (HOSPITAL_COMMUNITY)
Admission: RE | Disposition: A | Discharge status: Home / Self Care | Payer: Self-pay | Source: Home / Self Care | Attending: Orthopedic Surgery

## 2018-06-23 ENCOUNTER — Inpatient Hospital Stay (HOSPITAL_COMMUNITY): Payer: Medicare Other | Admitting: Anesthesiology

## 2018-06-23 ENCOUNTER — Other Ambulatory Visit: Payer: Self-pay

## 2018-06-23 DIAGNOSIS — M25561 Pain in right knee: Secondary | ICD-10-CM | POA: Diagnosis not present

## 2018-06-23 DIAGNOSIS — M069 Rheumatoid arthritis, unspecified: Secondary | ICD-10-CM | POA: Diagnosis not present

## 2018-06-23 DIAGNOSIS — Z903 Acquired absence of stomach [part of]: Secondary | ICD-10-CM | POA: Diagnosis not present

## 2018-06-23 DIAGNOSIS — K5900 Constipation, unspecified: Secondary | ICD-10-CM | POA: Diagnosis present

## 2018-06-23 DIAGNOSIS — Z6828 Body mass index (BMI) 28.0-28.9, adult: Secondary | ICD-10-CM

## 2018-06-23 DIAGNOSIS — R0902 Hypoxemia: Secondary | ICD-10-CM | POA: Diagnosis not present

## 2018-06-23 DIAGNOSIS — R32 Unspecified urinary incontinence: Secondary | ICD-10-CM | POA: Diagnosis not present

## 2018-06-23 DIAGNOSIS — I10 Essential (primary) hypertension: Secondary | ICD-10-CM | POA: Diagnosis not present

## 2018-06-23 DIAGNOSIS — R001 Bradycardia, unspecified: Secondary | ICD-10-CM | POA: Diagnosis not present

## 2018-06-23 DIAGNOSIS — M810 Age-related osteoporosis without current pathological fracture: Secondary | ICD-10-CM | POA: Diagnosis present

## 2018-06-23 DIAGNOSIS — J45909 Unspecified asthma, uncomplicated: Secondary | ICD-10-CM | POA: Diagnosis not present

## 2018-06-23 DIAGNOSIS — Z96652 Presence of left artificial knee joint: Secondary | ICD-10-CM | POA: Diagnosis present

## 2018-06-23 DIAGNOSIS — R55 Syncope and collapse: Secondary | ICD-10-CM | POA: Diagnosis not present

## 2018-06-23 DIAGNOSIS — M1710 Unilateral primary osteoarthritis, unspecified knee: Secondary | ICD-10-CM | POA: Diagnosis present

## 2018-06-23 DIAGNOSIS — R309 Painful micturition, unspecified: Secondary | ICD-10-CM | POA: Diagnosis not present

## 2018-06-23 DIAGNOSIS — H9190 Unspecified hearing loss, unspecified ear: Secondary | ICD-10-CM | POA: Diagnosis present

## 2018-06-23 DIAGNOSIS — E669 Obesity, unspecified: Secondary | ICD-10-CM | POA: Diagnosis present

## 2018-06-23 DIAGNOSIS — M1711 Unilateral primary osteoarthritis, right knee: Principal | ICD-10-CM | POA: Diagnosis present

## 2018-06-23 DIAGNOSIS — R7981 Abnormal blood-gas level: Secondary | ICD-10-CM

## 2018-06-23 DIAGNOSIS — G4730 Sleep apnea, unspecified: Secondary | ICD-10-CM | POA: Diagnosis not present

## 2018-06-23 DIAGNOSIS — G8918 Other acute postprocedural pain: Secondary | ICD-10-CM | POA: Diagnosis not present

## 2018-06-23 DIAGNOSIS — M179 Osteoarthritis of knee, unspecified: Secondary | ICD-10-CM | POA: Diagnosis present

## 2018-06-23 DIAGNOSIS — K219 Gastro-esophageal reflux disease without esophagitis: Secondary | ICD-10-CM | POA: Diagnosis not present

## 2018-06-23 DIAGNOSIS — E785 Hyperlipidemia, unspecified: Secondary | ICD-10-CM | POA: Diagnosis present

## 2018-06-23 DIAGNOSIS — Z79899 Other long term (current) drug therapy: Secondary | ICD-10-CM

## 2018-06-23 DIAGNOSIS — F329 Major depressive disorder, single episode, unspecified: Secondary | ICD-10-CM | POA: Diagnosis present

## 2018-06-23 DIAGNOSIS — H409 Unspecified glaucoma: Secondary | ICD-10-CM | POA: Diagnosis present

## 2018-06-23 HISTORY — PX: TOTAL KNEE ARTHROPLASTY: SHX125

## 2018-06-23 LAB — TYPE AND SCREEN
ABO/RH(D): B POS
Antibody Screen: NEGATIVE

## 2018-06-23 SURGERY — ARTHROPLASTY, KNEE, TOTAL
Anesthesia: Spinal | Site: Knee | Laterality: Right

## 2018-06-23 MED ORDER — DEXAMETHASONE SODIUM PHOSPHATE 10 MG/ML IJ SOLN: 8.0000 mg | Freq: Once | INTRAMUSCULAR | Status: AC

## 2018-06-23 MED ORDER — ALBUTEROL SULFATE (2.5 MG/3ML) 0.083% IN NEBU: 3.0000 mL | INHALATION_SOLUTION | Freq: Four times a day (QID) | RESPIRATORY_TRACT | Status: DC | PRN

## 2018-06-23 MED ORDER — ROSUVASTATIN CALCIUM 20 MG PO TABS
40.0000 mg | ORAL_TABLET | Freq: Every day | ORAL | Status: DC
  Administered 2018-06-24 – 2018-06-28 (×5): 40 mg via ORAL
  Filled 2018-06-23 (×5): qty 2

## 2018-06-23 MED ORDER — TRANEXAMIC ACID 1000 MG/10ML IV SOLN
1000.0000 mg | INTRAVENOUS | Status: AC
  Administered 2018-06-23: 1000 mg via INTRAVENOUS
  Filled 2018-06-23: qty 10

## 2018-06-23 MED ORDER — DEXAMETHASONE SODIUM PHOSPHATE 10 MG/ML IJ SOLN
INTRAMUSCULAR | Status: AC
  Filled 2018-06-23: qty 1

## 2018-06-23 MED ORDER — TRANEXAMIC ACID 1000 MG/10ML IV SOLN
1000.0000 mg | Freq: Once | INTRAVENOUS | Status: AC
  Administered 2018-06-23: 1000 mg via INTRAVENOUS
  Filled 2018-06-23: qty 10

## 2018-06-23 MED ORDER — METHOCARBAMOL 500 MG IVPB - SIMPLE MED
500.0000 mg | Freq: Four times a day (QID) | INTRAVENOUS | Status: DC | PRN
  Administered 2018-06-23: 500 mg via INTRAVENOUS
  Filled 2018-06-23: qty 50

## 2018-06-23 MED ORDER — METOCLOPRAMIDE HCL 5 MG/ML IJ SOLN: 5.0000 mg | Freq: Three times a day (TID) | INTRAMUSCULAR | Status: DC | PRN

## 2018-06-23 MED ORDER — PROMETHAZINE HCL 25 MG/ML IJ SOLN: 6.2500 mg | INTRAMUSCULAR | Status: DC | PRN

## 2018-06-23 MED ORDER — LACTATED RINGERS IV SOLN
INTRAVENOUS | Status: DC
  Administered 2018-06-23 (×2): via INTRAVENOUS

## 2018-06-23 MED ORDER — CEFAZOLIN SODIUM-DEXTROSE 2-4 GM/100ML-% IV SOLN
2.0000 g | Freq: Four times a day (QID) | INTRAVENOUS | Status: AC
  Administered 2018-06-23 – 2018-06-24 (×2): 2 g via INTRAVENOUS
  Filled 2018-06-23 (×2): qty 100

## 2018-06-23 MED ORDER — TRAMADOL HCL 50 MG PO TABS
50.0000 mg | ORAL_TABLET | Freq: Four times a day (QID) | ORAL | Status: DC | PRN
  Administered 2018-06-23: 100 mg via ORAL
  Administered 2018-06-24 (×2): 50 mg via ORAL
  Administered 2018-06-24: 100 mg via ORAL
  Administered 2018-06-25: 50 mg via ORAL
  Administered 2018-06-25 – 2018-06-27 (×3): 100 mg via ORAL
  Filled 2018-06-23: qty 1
  Filled 2018-06-23 (×3): qty 2
  Filled 2018-06-23: qty 1
  Filled 2018-06-23: qty 2
  Filled 2018-06-23: qty 1
  Filled 2018-06-23: qty 2

## 2018-06-23 MED ORDER — GABAPENTIN 300 MG PO CAPS
300.0000 mg | ORAL_CAPSULE | Freq: Once | ORAL | Status: AC
  Administered 2018-06-23: 300 mg via ORAL
  Filled 2018-06-23: qty 1

## 2018-06-23 MED ORDER — ONDANSETRON HCL 4 MG PO TABS: 4.0000 mg | ORAL_TABLET | Freq: Four times a day (QID) | ORAL | Status: DC | PRN

## 2018-06-23 MED ORDER — PHENOL 1.4 % MT LIQD: 1.0000 | OROMUCOSAL | Status: DC | PRN

## 2018-06-23 MED ORDER — HYDROMORPHONE HCL 1 MG/ML IJ SOLN
INTRAMUSCULAR | Status: AC
  Filled 2018-06-23: qty 1

## 2018-06-23 MED ORDER — DIPHENHYDRAMINE HCL 12.5 MG/5ML PO ELIX: 12.5000 mg | ORAL_SOLUTION | ORAL | Status: DC | PRN

## 2018-06-23 MED ORDER — ACETAMINOPHEN 500 MG PO TABS
1000.0000 mg | ORAL_TABLET | Freq: Four times a day (QID) | ORAL | Status: AC
  Administered 2018-06-23 – 2018-06-24 (×4): 1000 mg via ORAL
  Filled 2018-06-23 (×4): qty 2

## 2018-06-23 MED ORDER — SODIUM CHLORIDE 0.9 % IR SOLN
Status: DC | PRN
  Administered 2018-06-23: 1000 mL

## 2018-06-23 MED ORDER — RIVAROXABAN 10 MG PO TABS
10.0000 mg | ORAL_TABLET | Freq: Every day | ORAL | Status: DC
  Administered 2018-06-24 – 2018-06-28 (×5): 10 mg via ORAL
  Filled 2018-06-23 (×5): qty 1

## 2018-06-23 MED ORDER — HYDROMORPHONE HCL 1 MG/ML IJ SOLN
0.5000 mg | INTRAMUSCULAR | Status: DC | PRN
  Administered 2018-06-23 – 2018-06-24 (×2): 1 mg via INTRAVENOUS
  Administered 2018-06-24 – 2018-06-26 (×3): 0.5 mg via INTRAVENOUS
  Filled 2018-06-23 (×5): qty 1

## 2018-06-23 MED ORDER — BUPIVACAINE LIPOSOME 1.3 % IJ SUSP
INTRAMUSCULAR | Status: DC | PRN
  Administered 2018-06-23: 20 mL

## 2018-06-23 MED ORDER — METHOCARBAMOL 500 MG IVPB - SIMPLE MED
INTRAVENOUS | Status: AC
  Filled 2018-06-23: qty 50

## 2018-06-23 MED ORDER — ACETAMINOPHEN 10 MG/ML IV SOLN
1000.0000 mg | Freq: Four times a day (QID) | INTRAVENOUS | Status: DC
  Administered 2018-06-23: 1000 mg via INTRAVENOUS
  Filled 2018-06-23: qty 100

## 2018-06-23 MED ORDER — LORATADINE 10 MG PO TABS
10.0000 mg | ORAL_TABLET | Freq: Every day | ORAL | Status: DC
  Administered 2018-06-23 – 2018-06-27 (×5): 10 mg via ORAL
  Filled 2018-06-23 (×5): qty 1

## 2018-06-23 MED ORDER — SODIUM CHLORIDE 0.9 % IV SOLN
INTRAVENOUS | Status: DC
  Administered 2018-06-23 – 2018-06-24 (×3): via INTRAVENOUS

## 2018-06-23 MED ORDER — ONDANSETRON HCL 4 MG/2ML IJ SOLN
INTRAMUSCULAR | Status: DC | PRN
  Administered 2018-06-23: 4 mg via INTRAVENOUS

## 2018-06-23 MED ORDER — SODIUM CHLORIDE 0.9 % IJ SOLN
INTRAMUSCULAR | Status: AC
  Filled 2018-06-23: qty 10

## 2018-06-23 MED ORDER — PROPOFOL 10 MG/ML IV BOLUS
INTRAVENOUS | Status: AC
  Filled 2018-06-23: qty 40

## 2018-06-23 MED ORDER — MENTHOL 3 MG MT LOZG
1.0000 | LOZENGE | OROMUCOSAL | Status: DC | PRN
  Filled 2018-06-23: qty 9

## 2018-06-23 MED ORDER — PANTOPRAZOLE SODIUM 40 MG PO TBEC
80.0000 mg | DELAYED_RELEASE_TABLET | Freq: Every day | ORAL | Status: DC
  Administered 2018-06-24 – 2018-06-28 (×5): 80 mg via ORAL
  Filled 2018-06-23 (×5): qty 2

## 2018-06-23 MED ORDER — MONTELUKAST SODIUM 10 MG PO TABS
10.0000 mg | ORAL_TABLET | Freq: Every day | ORAL | Status: DC
  Administered 2018-06-23 – 2018-06-27 (×5): 10 mg via ORAL
  Filled 2018-06-23 (×5): qty 1

## 2018-06-23 MED ORDER — STERILE WATER FOR IRRIGATION IR SOLN
Status: DC | PRN
  Administered 2018-06-23: 2000 mL

## 2018-06-23 MED ORDER — POLYETHYLENE GLYCOL 3350 17 G PO PACK
17.0000 g | PACK | Freq: Every day | ORAL | Status: DC | PRN
  Administered 2018-06-27: 17 g via ORAL
  Filled 2018-06-23: qty 1

## 2018-06-23 MED ORDER — PROPOFOL 10 MG/ML IV BOLUS
INTRAVENOUS | Status: AC
  Filled 2018-06-23: qty 20

## 2018-06-23 MED ORDER — FLUOXETINE HCL 20 MG PO CAPS
20.0000 mg | ORAL_CAPSULE | Freq: Every day | ORAL | Status: DC
  Administered 2018-06-24 – 2018-06-28 (×5): 20 mg via ORAL
  Filled 2018-06-23 (×5): qty 1

## 2018-06-23 MED ORDER — DEXAMETHASONE SODIUM PHOSPHATE 10 MG/ML IJ SOLN
10.0000 mg | Freq: Once | INTRAMUSCULAR | Status: AC
  Administered 2018-06-24: 10 mg via INTRAVENOUS
  Filled 2018-06-23: qty 1

## 2018-06-23 MED ORDER — METHOCARBAMOL 500 MG PO TABS
500.0000 mg | ORAL_TABLET | Freq: Four times a day (QID) | ORAL | Status: DC | PRN
  Administered 2018-06-24 – 2018-06-27 (×5): 500 mg via ORAL
  Filled 2018-06-23 (×5): qty 1

## 2018-06-23 MED ORDER — LOSARTAN POTASSIUM 50 MG PO TABS
50.0000 mg | ORAL_TABLET | Freq: Every day | ORAL | Status: DC
  Administered 2018-06-24 – 2018-06-28 (×5): 50 mg via ORAL
  Filled 2018-06-23 (×5): qty 1

## 2018-06-23 MED ORDER — DOCUSATE SODIUM 100 MG PO CAPS
100.0000 mg | ORAL_CAPSULE | Freq: Two times a day (BID) | ORAL | Status: DC
  Administered 2018-06-23 – 2018-06-28 (×10): 100 mg via ORAL
  Filled 2018-06-23 (×10): qty 1

## 2018-06-23 MED ORDER — ONDANSETRON HCL 4 MG/2ML IJ SOLN
4.0000 mg | Freq: Four times a day (QID) | INTRAMUSCULAR | Status: DC | PRN
  Administered 2018-06-23 – 2018-06-27 (×3): 4 mg via INTRAVENOUS
  Filled 2018-06-23 (×3): qty 2

## 2018-06-23 MED ORDER — FAMOTIDINE 20 MG PO TABS
20.0000 mg | ORAL_TABLET | Freq: Every day | ORAL | Status: DC
  Administered 2018-06-23 – 2018-06-27 (×5): 20 mg via ORAL
  Filled 2018-06-23 (×5): qty 1

## 2018-06-23 MED ORDER — ONDANSETRON HCL 4 MG/2ML IJ SOLN
INTRAMUSCULAR | Status: AC
  Filled 2018-06-23: qty 2

## 2018-06-23 MED ORDER — CLONIDINE HCL (ANALGESIA) 100 MCG/ML EP SOLN
EPIDURAL | Status: DC | PRN
  Administered 2018-06-23: 50 ug

## 2018-06-23 MED ORDER — FLEET ENEMA 7-19 GM/118ML RE ENEM: 1.0000 | ENEMA | Freq: Once | RECTAL | Status: DC | PRN

## 2018-06-23 MED ORDER — GABAPENTIN 300 MG PO CAPS
300.0000 mg | ORAL_CAPSULE | Freq: Three times a day (TID) | ORAL | Status: DC
  Administered 2018-06-23 – 2018-06-28 (×14): 300 mg via ORAL
  Filled 2018-06-23 (×14): qty 1

## 2018-06-23 MED ORDER — METOCLOPRAMIDE HCL 5 MG PO TABS: 5.0000 mg | ORAL_TABLET | Freq: Three times a day (TID) | ORAL | Status: DC | PRN

## 2018-06-23 MED ORDER — SODIUM CHLORIDE 0.9 % IJ SOLN
INTRAMUSCULAR | Status: DC | PRN
  Administered 2018-06-23: 60 mL

## 2018-06-23 MED ORDER — MIDAZOLAM HCL 2 MG/2ML IJ SOLN
1.0000 mg | INTRAMUSCULAR | Status: DC
  Administered 2018-06-23: 1 mg via INTRAVENOUS
  Filled 2018-06-23: qty 2

## 2018-06-23 MED ORDER — BUPIVACAINE IN DEXTROSE 0.75-8.25 % IT SOLN
INTRATHECAL | Status: DC | PRN
  Administered 2018-06-23: 1.8 mL via INTRATHECAL

## 2018-06-23 MED ORDER — CHLORHEXIDINE GLUCONATE 4 % EX LIQD: 60.0000 mL | Freq: Once | CUTANEOUS | Status: DC

## 2018-06-23 MED ORDER — CEFAZOLIN SODIUM-DEXTROSE 2-4 GM/100ML-% IV SOLN
2.0000 g | INTRAVENOUS | Status: AC
  Administered 2018-06-23: 2 g via INTRAVENOUS
  Filled 2018-06-23: qty 100

## 2018-06-23 MED ORDER — BISACODYL 10 MG RE SUPP: 10.0000 mg | Freq: Every day | RECTAL | Status: DC | PRN

## 2018-06-23 MED ORDER — FENTANYL CITRATE (PF) 100 MCG/2ML IJ SOLN
50.0000 ug | INTRAMUSCULAR | Status: DC
  Administered 2018-06-23: 50 ug via INTRAVENOUS
  Filled 2018-06-23: qty 2

## 2018-06-23 MED ORDER — SODIUM CHLORIDE 0.9 % IJ SOLN
INTRAMUSCULAR | Status: AC
  Filled 2018-06-23: qty 50

## 2018-06-23 MED ORDER — ROPIVACAINE HCL 7.5 MG/ML IJ SOLN
INTRAMUSCULAR | Status: DC | PRN
  Administered 2018-06-23: 20 mL via PERINEURAL

## 2018-06-23 MED ORDER — DEXAMETHASONE SODIUM PHOSPHATE 10 MG/ML IJ SOLN
INTRAMUSCULAR | Status: DC | PRN
  Administered 2018-06-23: 10 mg via INTRAVENOUS

## 2018-06-23 MED ORDER — PROPOFOL 500 MG/50ML IV EMUL
INTRAVENOUS | Status: DC | PRN
  Administered 2018-06-23: 100 ug/kg/min via INTRAVENOUS

## 2018-06-23 MED ORDER — HYDROMORPHONE HCL 1 MG/ML IJ SOLN
0.2500 mg | INTRAMUSCULAR | Status: DC | PRN
  Administered 2018-06-23 (×2): 0.5 mg via INTRAVENOUS

## 2018-06-23 SURGICAL SUPPLY — 62 items
BAG SPEC THK2 15X12 ZIP CLS (MISCELLANEOUS) ×1
BAG ZIPLOCK 12X15 (MISCELLANEOUS) ×3 IMPLANT
BANDAGE ACE 6X5 VEL STRL LF (GAUZE/BANDAGES/DRESSINGS) ×3 IMPLANT
BLADE SAG 18X100X1.27 (BLADE) ×3 IMPLANT
BLADE SAW SGTL 11.0X1.19X90.0M (BLADE) ×3 IMPLANT
BOWL SMART MIX CTS (DISPOSABLE) ×3 IMPLANT
CEMENT HV SMART SET (Cement) ×6 IMPLANT
CEMENT TIBIA MBT (Knees) IMPLANT
CLOSURE WOUND 1/2 X4 (GAUZE/BANDAGES/DRESSINGS) ×1
COVER SURGICAL LIGHT HANDLE (MISCELLANEOUS) ×3 IMPLANT
CUFF TOURN SGL QUICK 34 (TOURNIQUET CUFF) ×3
CUFF TRNQT CYL 34X4X40X1 (TOURNIQUET CUFF) ×1 IMPLANT
DECANTER SPIKE VIAL GLASS SM (MISCELLANEOUS) ×5 IMPLANT
DRAPE U-SHAPE 47X51 STRL (DRAPES) ×3 IMPLANT
DRSG ADAPTIC 3X8 NADH LF (GAUZE/BANDAGES/DRESSINGS) ×3 IMPLANT
DRSG PAD ABDOMINAL 8X10 ST (GAUZE/BANDAGES/DRESSINGS) ×3 IMPLANT
DURAPREP 26ML APPLICATOR (WOUND CARE) ×3 IMPLANT
ELECT REM PT RETURN 15FT ADLT (MISCELLANEOUS) ×3 IMPLANT
EVACUATOR 1/8 PVC DRAIN (DRAIN) ×3 IMPLANT
FEMUR SIGMA PS SZ 4.0 R (Femur) ×2 IMPLANT
GAUZE SPONGE 4X4 12PLY STRL (GAUZE/BANDAGES/DRESSINGS) ×3 IMPLANT
GLOVE BIO SURGEON STRL SZ7 (GLOVE) ×3 IMPLANT
GLOVE BIO SURGEON STRL SZ7.5 (GLOVE) ×2 IMPLANT
GLOVE BIO SURGEON STRL SZ8 (GLOVE) ×6 IMPLANT
GLOVE BIOGEL PI IND STRL 6.5 (GLOVE) ×1 IMPLANT
GLOVE BIOGEL PI IND STRL 7.0 (GLOVE) ×2 IMPLANT
GLOVE BIOGEL PI IND STRL 7.5 (GLOVE) IMPLANT
GLOVE BIOGEL PI IND STRL 8 (GLOVE) ×1 IMPLANT
GLOVE BIOGEL PI INDICATOR 6.5 (GLOVE)
GLOVE BIOGEL PI INDICATOR 7.0 (GLOVE) ×10
GLOVE BIOGEL PI INDICATOR 7.5 (GLOVE) ×2
GLOVE BIOGEL PI INDICATOR 8 (GLOVE) ×2
GLOVE SURG SS PI 6.5 STRL IVOR (GLOVE) ×1 IMPLANT
GLOVE SURG SS PI 7.0 STRL IVOR (GLOVE) ×2 IMPLANT
GOWN SPEC L4 XLG W/TWL (GOWN DISPOSABLE) ×4 IMPLANT
GOWN STRL REUS W/TWL LRG LVL3 (GOWN DISPOSABLE) ×6 IMPLANT
HANDPIECE INTERPULSE COAX TIP (DISPOSABLE) ×3
HOLDER FOLEY CATH W/STRAP (MISCELLANEOUS) IMPLANT
IMMOBILIZER KNEE 20 (SOFTGOODS) ×3
IMMOBILIZER KNEE 20 THIGH 36 (SOFTGOODS) ×1 IMPLANT
MANIFOLD NEPTUNE II (INSTRUMENTS) ×3 IMPLANT
NS IRRIG 1000ML POUR BTL (IV SOLUTION) ×3 IMPLANT
PACK TOTAL KNEE CUSTOM (KITS) ×3 IMPLANT
PAD ABD 8X10 STRL (GAUZE/BANDAGES/DRESSINGS) ×2 IMPLANT
PADDING CAST COTTON 6X4 STRL (CAST SUPPLIES) ×5 IMPLANT
PATELLA DOME PFC 38MM (Knees) ×2 IMPLANT
PIN STEINMAN FIXATION KNEE (PIN) ×2 IMPLANT
PLATE ROT INSERT 15MM SIZE 4 (Plate) ×2 IMPLANT
POSITIONER SURGICAL ARM (MISCELLANEOUS) ×3 IMPLANT
SET HNDPC FAN SPRY TIP SCT (DISPOSABLE) ×1 IMPLANT
STRIP CLOSURE SKIN 1/2X4 (GAUZE/BANDAGES/DRESSINGS) ×3 IMPLANT
SUT MNCRL AB 4-0 PS2 18 (SUTURE) ×3 IMPLANT
SUT STRATAFIX 0 PDS 27 VIOLET (SUTURE) ×3
SUT VIC AB 2-0 CT1 27 (SUTURE) ×9
SUT VIC AB 2-0 CT1 TAPERPNT 27 (SUTURE) ×3 IMPLANT
SUTURE STRATFX 0 PDS 27 VIOLET (SUTURE) ×1 IMPLANT
TIBIA MBT CEMENT (Knees) ×3 IMPLANT
TRAY FOLEY CATH 14FRSI W/METER (CATHETERS) ×2 IMPLANT
TRAY FOLEY MTR SLVR 16FR STAT (SET/KITS/TRAYS/PACK) ×1 IMPLANT
WATER STERILE IRR 1000ML POUR (IV SOLUTION) ×6 IMPLANT
WRAP KNEE MAXI GEL POST OP (GAUZE/BANDAGES/DRESSINGS) ×3 IMPLANT
YANKAUER SUCT BULB TIP 10FT TU (MISCELLANEOUS) ×3 IMPLANT

## 2018-06-23 NOTE — Interval H&P Note (Signed)
History and Physical Interval Note:  06/23/2018 9:18 AM  [name]  has presented today for surgery, with the diagnosis of right knee osteoarthritis  The various methods of treatment have been discussed with the patient and family. After consideration of risks, benefits and other options for treatment, the patient has consented to  Procedure(s): RIGHT TOTAL KNEE ARTHROPLASTY (Right) as a surgical intervention .  The patient's history has been reviewed, patient examined, no change in status, stable for surgery.  I have reviewed the patient's chart and labs.  Questions were answered to the patient's satisfaction.     Pilar Plate [name]

## 2018-06-23 NOTE — Anesthesia Procedure Notes (Signed)
Spinal  Patient location during procedure: OR Start time: 06/23/2018 11:34 AM End time: 06/23/2018 11:36 AM Staffing Resident/CRNA: Lind Covert, CRNA Preanesthetic Checklist Completed: patient identified, site marked, surgical consent, pre-op evaluation, timeout performed, IV checked, risks and benefits discussed and monitors and equipment checked Spinal Block Patient position: sitting Prep: DuraPrep Patient monitoring: heart rate, cardiac monitor, continuous pulse ox and blood pressure Approach: midline Location: L3-4 Injection technique: single-shot Needle Needle type: Pencan  Needle gauge: 24 G Needle length: 10 cm Needle insertion depth: 7 cm Assessment Sensory level: T6 Additional Notes Timeout performed. SAB kit date checked. SAB without dificulty

## 2018-06-23 NOTE — Evaluation (Signed)
Physical Therapy Evaluation Patient Details Name: [name] MRN: [id number] DOB: [birth year] Today's Date: 06/23/2018   History of Present Illness  Pt is 77 YO female s/p R TKR on 06/23/18. PMH includes HTN, OA, RA, fibromyalgia, glaucoma, OP, sleep apnea, depression, hearing loss, bradycardia. Past surgical history includes cardiac cath, gastric resection, L TKR 2018.  Clinical Impression   Pt s/p R TKR. Pt presents with difficulty performing mobility tasks, moderate R knee pain, and decreased activity tolerance. Pt also with period of nausea after 25 ft of ambulation today. Pt with bout of emesis. RN notified. Pt to benefit from acute PT. Pt's d/c plan is to go home with vera. Pt expressed concerns about going home due to husband with cognitive impairments not being able to help her for the next couple of weeks. PT to order OT to help pt with ADLs prior to d/c home. PT recommending Home health PT versus virtual PT, depending on pt's progress and comfort with mobility. Will continue to follow acutely.   BP: 167/72, HR: 41    Follow Up Recommendations Follow surgeon's recommendation for DC plan and follow-up therapies;Supervision for mobility/OOB(VERA )    Equipment Recommendations  None recommended by PT    Recommendations for Other Services       Precautions / Restrictions Precautions Precautions: Fall;Knee Required Braces or Orthoses: Knee Immobilizer - Right Knee Immobilizer - Right: On when out of bed or walking;Discontinue once straight leg raise with < 10 degree lag Restrictions Weight Bearing Restrictions: No Other Position/Activity Restrictions: WBAT       Mobility  Bed Mobility Overal bed mobility: Needs Assistance Bed Mobility: Supine to Sit     Supine to sit: Min assist     General bed mobility comments: Min assist for LE management. Verbal cuing for sequencing.   Transfers Overall transfer level: Needs assistance Equipment used: Rolling walker (2  wheeled) Transfers: Sit to/from Stand Sit to Stand: Min assist         General transfer comment: Min assist for power up and steadying upon standing. Verbal cuing for hand placement.   Ambulation/Gait Ambulation/Gait assistance: Min assist;+2 safety/equipment(recliner follow ) Gait Distance (Feet): 25 Feet   Gait Pattern/deviations: Step-to pattern;Decreased stride length;Decreased weight shift to right;Decreased stance time - right;Antalgic;Trunk flexed Gait velocity: decr    General Gait Details: Min assist for steadying, sequencing. At end of 25 ft ambulation, pt with reports of nausea. Pt sat down in recliner, and had small amount of emesis.   Stairs            Wheelchair Mobility    Modified Rankin (Stroke Patients Only)       Balance Overall balance assessment: Needs assistance Sitting-balance support: No upper extremity supported;Feet unsupported Sitting balance-Leahy Scale: Good       Standing balance-Leahy Scale: Poor Standing balance comment: relies of RW for UE support during standing.                              Pertinent Vitals/Pain Pain Assessment: 0-10 Pain Score: 7  Pain Location: R knee Pain Descriptors / Indicators: Constant Pain Intervention(s): Limited activity within patient's tolerance;Ice applied;Monitored during session;Patient requesting pain meds-RN notified;Premedicated before session    Home Living Family/patient expects to be discharged to:: Private residence Living Arrangements: Spouse/significant other Available Help at Discharge: Family;Available PRN/intermittently(pt's husband has early-onset dementia per pt and he will not be able to help her. Daughters can help occasionally. )  Type of Home: House Home Access: Stairs to enter Entrance Stairs-Rails: None Entrance Stairs-Number of Steps: 2 Home Layout: One level Home Equipment: Walker - 2 wheels;Bedside commode;Cane - single point;Tub bench      Prior Function  Level of Independence: Independent with assistive device(s)   Gait / Transfers Assistance Needed: pt used cane most times PTA, occasionally would use RW.   ADL's / Homemaking Assistance Needed: difficulty with cleaning prior to admission         Hand Dominance   Dominant Hand: Right    Extremity/Trunk Assessment   Upper Extremity Assessment Upper Extremity Assessment: Overall WFL for tasks assessed    Lower Extremity Assessment Lower Extremity Assessment: RLE deficits/detail RLE Deficits / Details: suspected post-surgical RLE weakness; able to perform SLR with knee immobilizer during bed mobility  RLE Sensation: WNL       Communication   Communication: No difficulties  Cognition Arousal/Alertness: Lethargic;Suspect due to medications Behavior During Therapy: [facility] for tasks assessed/performed Overall Cognitive Status: Within Functional Limits for tasks assessed                                        General Comments      Exercises Total Joint Exercises Ankle Circles/Pumps: AROM;Both;10 reps;Seated   Assessment/Plan    PT Assessment Patient needs continued PT services  PT Problem List Decreased strength;Pain;Decreased range of motion;Decreased activity tolerance;Decreased knowledge of use of DME;Decreased balance;Decreased mobility       PT Treatment Interventions DME instruction;Therapeutic activities;Gait training;Therapeutic exercise;Patient/family education;Balance training;Stair training;Functional mobility training    PT Goals (Current goals can be found in the Care Plan section)  Acute Rehab PT Goals PT Goal Formulation: With patient Time For Goal Achievement: 07/07/18 Potential to Achieve Goals: Good    Frequency 7X/week   Barriers to discharge        Co-evaluation               AM-PAC PT "6 Clicks" Daily Activity  Outcome Measure Difficulty turning over in bed (including adjusting bedclothes, sheets and blankets)?:  Unable Difficulty moving from lying on back to sitting on the side of the bed? : Unable Difficulty sitting down on and standing up from a chair with arms (e.g., wheelchair, bedside commode, etc,.)?: Unable Help needed moving to and from a bed to chair (including a wheelchair)?: A Little Help needed walking in hospital room?: A Little Help needed climbing 3-5 steps with a railing? : A Lot 6 Click Score: 11    End of Session Equipment Utilized During Treatment: Gait belt; knee immobilizer- right  Activity Tolerance: Patient limited by pain;Treatment limited secondary to medical complications (Comment)(emesis) Patient left: in chair;with chair alarm set;with call bell/phone within reach;with family/visitor present;with SCD's reapplied Nurse Communication: Mobility status PT Visit Diagnosis: Other abnormalities of gait and mobility (R26.89);Difficulty in walking, not elsewhere classified (R26.2)    Time: 7408-1448 PT Time Calculation (min) (ACUTE ONLY): 42 min   Charges:   PT Evaluation $PT Eval Low Complexity: 1 Low PT Treatments $Gait Training: 8-22 mins        Shadrack Brummitt Conception Chancy, PT, DPT  Pager # [phone]    Jacqulyn Barresi D Akia Desroches 06/23/2018, 7:52 PM

## 2018-06-23 NOTE — Anesthesia Procedure Notes (Signed)
Anesthesia Regional Block: Adductor canal block   Pre-Anesthetic Checklist: ,, timeout performed, Correct Patient, Correct Site, Correct Laterality, Correct Procedure, Correct Position, site marked, Risks and benefits discussed,  Surgical consent,  Pre-op evaluation,  At surgeon's request and post-op pain management  Laterality: Right  Prep: chloraprep       Needles:  Injection technique: Single-shot  Needle Type: Echogenic Needle     Needle Length: 9cm      Additional Needles:   Procedures:,,,, ultrasound used (permanent image in chart),,,,  Narrative:  Start time: 06/23/2018 10:24 AM End time: 06/23/2018 10:32 AM Injection made incrementally with aspirations every 5 mL.  Performed by: Personally  Anesthesiologist: Myrtie Soman, MD  Additional Notes: Patient tolerated the procedure well without complications

## 2018-06-23 NOTE — Anesthesia Procedure Notes (Signed)
Anesthesia Procedure Image    

## 2018-06-23 NOTE — Op Note (Signed)
OPERATIVE REPORT-TOTAL KNEE ARTHROPLASTY   Pre-operative diagnosis- Osteoarthritis  Right knee(s)  Post-operative diagnosis- Osteoarthritis Right knee(s)  Procedure-  Right  Total Knee Arthroplasty  Surgeon- [name]. Jeneen Doutt, MD  Assistant- Theresa Duty, PA-C   Anesthesia-  Adductor canal block and spinal  EBL-20 mL   Drains Hemovac  Tourniquet time-  Total Tourniquet Time Documented: Thigh (Right) - 38 minutes Total: Thigh (Right) - 38 minutes     Complications- None  Condition-PACU - hemodynamically stable.   Brief Clinical Note  [name] is a 77 y.o. year old female with end stage OA of her right knee with progressively worsening pain and dysfunction. She has constant pain, with activity and at rest and significant functional deficits with difficulties even with ADLs. She has had extensive non-op management including analgesics, injections of cortisone and viscosupplements, and home exercise program, but remains in significant pain with significant dysfunction.Radiographs show bone on bone arthritis patellofemoral with medial and lateral narrowing. She presents now for right Total Knee Arthroplasty.    Procedure in detail---   The patient is brought into the operating room and positioned supine on the operating table. After successful administration of  Adductor canal block and spinal,   a tourniquet is placed high on the  Right thigh(s) and the lower extremity is prepped and draped in the usual sterile fashion. Time out is performed by the operating team and then the  Right lower extremity is wrapped in Esmarch, knee flexed and the tourniquet inflated to 300 mmHg.       A midline incision is made with a ten blade through the subcutaneous tissue to the level of the extensor mechanism. A fresh blade is used to make a medial parapatellar arthrotomy. Soft tissue over the proximal medial tibia is subperiosteally elevated to the joint line with a knife and into the  semimembranosus bursa with a Cobb elevator. Soft tissue over the proximal lateral tibia is elevated with attention being paid to avoiding the patellar tendon on the tibial tubercle. The patella is everted, knee flexed 90 degrees and the ACL and PCL are removed. Findings are bone on bone patellofemoral with exposed bone medial and lateral compartments        The drill is used to create a starting hole in the distal femur and the canal is thoroughly irrigated with sterile saline to remove the fatty contents. The 5 degree Right  valgus alignment guide is placed into the femoral canal and the distal femoral cutting block is pinned to remove 10 mm off the distal femur. Resection is made with an oscillating saw.      The tibia is subluxed forward and the menisci are removed. The extramedullary alignment guide is placed referencing proximally at the medial aspect of the tibial tubercle and distally along the second metatarsal axis and tibial crest. The block is pinned to remove 48mm off the more deficient medial  side. Resection is made with an oscillating saw. Size 3is the most appropriate size for the tibia and the proximal tibia is prepared with the modular drill and keel punch for that size.      The femoral sizing guide is placed and size 4 is most appropriate. Rotation is marked off the epicondylar axis and confirmed by creating a rectangular flexion gap at 90 degrees. The size 4 cutting block is pinned in this rotation and the anterior, posterior and chamfer cuts are made with the oscillating saw. The intercondylar block is then placed and that cut  is made.      Trial size 3 tibial component, trial size 4 posterior stabilized femur and a 15  mm posterior stabilized rotating platform insert trial is placed. Full extension is achieved with excellent varus/valgus and anterior/posterior balance throughout full range of motion. The patella is everted and thickness measured to be 22  mm. Free hand resection is taken to  12 mm, a 38 template is placed, lug holes are drilled, trial patella is placed, and it tracks normally. Osteophytes are removed off the posterior femur with the trial in place. All trials are removed and the cut bone surfaces prepared with pulsatile lavage. Cement is mixed and once ready for implantation, the size 3 tibial implant, size  4 posterior stabilized femoral component, and the size 38 patella are cemented in place and the patella is held with the clamp. The trial insert is placed and the knee held in full extension. The Exparel (20 ml mixed with 60 ml saline) is injected into the extensor mechanism, posterior capsule, medial and lateral gutters and subcutaneous tissues.  All extruded cement is removed and once the cement is hard the permanent 15 mm posterior stabilized rotating platform insert is placed into the tibial tray.      The wound is copiously irrigated with saline solution and the extensor mechanism closed over a hemovac drain with #1 V-loc suture. The tourniquet is released for a total tourniquet time of 38  minutes. Flexion against gravity is 140 degrees and the patella tracks normally. Subcutaneous tissue is closed with 2.0 vicryl and subcuticular with running 4.0 Monocryl. The incision is cleaned and dried and steri-strips and a bulky sterile dressing are applied. The limb is placed into a knee immobilizer and the patient is awakened and transported to recovery in stable condition.      Please note that a surgical assistant was a medical necessity for this procedure in order to perform it in a safe and expeditious manner. Surgical assistant was necessary to retract the ligaments and vital neurovascular structures to prevent injury to them and also necessary for proper positioning of the limb to allow for anatomic placement of the prosthesis.   [name] Cheryl Redditt, MD    06/23/2018, 12:41 PM

## 2018-06-23 NOTE — Transfer of Care (Signed)
Immediate Anesthesia Transfer of Care Note  Patient: [name]  Procedure(s) Performed: RIGHT TOTAL KNEE ARTHROPLASTY (Right Knee)  Patient Location: PACU  Anesthesia Type:MAC and Spinal  Level of Consciousness: awake, alert  and oriented  Airway & Oxygen Therapy: Patient Spontanous Breathing and Patient connected to face mask oxygen  Post-op Assessment: Report given to RN and Post -op Vital signs reviewed and stable  Post vital signs: Reviewed and stable  Last Vitals:  Vitals Value Taken Time  BP 129/55 06/23/2018  1:08 PM  Temp    Pulse 53 06/23/2018  1:10 PM  Resp 18 06/23/2018  1:10 PM  SpO2 100 % 06/23/2018  1:10 PM  Vitals shown include unvalidated device data.  Last Pain:  Vitals:   06/23/18 1052  TempSrc:   PainSc: 0-No pain      Patients Stated Pain Goal: 4 (78/58/85 0277)  Complications: No apparent anesthesia complications

## 2018-06-23 NOTE — Progress Notes (Signed)
Dr Kalman Shan aware of pt's HR in pacu in the 40's, going down to the 30's occasionally. States this is ok, No orders received.

## 2018-06-23 NOTE — Anesthesia Postprocedure Evaluation (Signed)
Anesthesia Post Note  Patient: [name]  Procedure(s) Performed: RIGHT TOTAL KNEE ARTHROPLASTY (Right Knee)     Patient location during evaluation: PACU Anesthesia Type: Spinal Level of consciousness: oriented and awake and alert Pain management: pain level controlled Vital Signs Assessment: post-procedure vital signs reviewed and stable Respiratory status: spontaneous breathing, respiratory function stable and patient connected to nasal cannula oxygen Cardiovascular status: blood pressure returned to baseline and stable Postop Assessment: no headache, no backache and no apparent nausea or vomiting Anesthetic complications: no    Last Vitals:  Vitals:   06/23/18 1410 06/23/18 1415  BP:  (!) 120/52  Pulse: (!) 40 (!) 40  Resp: 12 10  Temp:    SpO2: 100% 100%    Last Pain:  Vitals:   06/23/18 1400  TempSrc:   PainSc: 0-No pain                 Ianmichael Amescua S

## 2018-06-23 NOTE — Progress Notes (Signed)
Assisted Dr. Rose with right, ultrasound guided, adductor canal block. Side rails up, monitors on throughout procedure. See vital signs in flow sheet. Tolerated Procedure well.  

## 2018-06-23 NOTE — Discharge Instructions (Addendum)
° °Dr. Frank Aluisio °Total Joint Specialist °[facility] °[address] °[place], [place] [address] °[phone] ° °TOTAL KNEE REPLACEMENT POSTOPERATIVE DIRECTIONS ° °Knee Rehabilitation, Guidelines Following Surgery  °Results after knee surgery are often greatly improved when you follow the exercise, range of motion and muscle strengthening exercises prescribed by your doctor. Safety measures are also important to protect the knee from further injury. Any time any of these exercises cause you to have increased pain or swelling in your knee joint, decrease the amount until you are comfortable again and slowly increase them. If you have problems or questions, call your caregiver or physical therapist for advice.  ° °HOME CARE INSTRUCTIONS  °Remove items at home which could result in a fall. This includes throw rugs or furniture in walking pathways.  °· ICE to the affected knee every three hours for 30 minutes at a time and then as needed for pain and swelling.  Continue to use ice on the knee for pain and swelling from surgery. You may notice swelling that will progress down to the foot and ankle.  This is normal after surgery.  Elevate the leg when you are not up walking on it.   °· Continue to use the breathing machine which will help keep your temperature down.  It is common for your temperature to cycle up and down following surgery, especially at night when you are not up moving around and exerting yourself.  The breathing machine keeps your lungs expanded and your temperature down. °· Do not place pillow under knee, focus on keeping the knee straight while resting ° °DIET °You may resume your previous home diet once your are discharged from the hospital. ° °DRESSING / WOUND CARE / SHOWERING °You may change your dressing every day with sterile gauze.  Please use good hand washing techniques before changing the dressing.  Do not use any lotions or creams on the incision until instructed by your  surgeon. °You may start showering once you are discharged home but do not submerge the incision under water. Just pat the incision dry and apply a dry gauze dressing on daily. °Change the surgical dressing daily and reapply a dry dressing each time. ° °ACTIVITY °Walk with your walker as instructed. °Use walker as long as suggested by your caregivers. °Avoid periods of inactivity such as sitting longer than an hour when not asleep. This helps prevent blood clots.  °You may resume a sexual relationship in one month or when given the OK by your doctor.  °You may return to work once you are cleared by your doctor.  °Do not drive a car for 6 weeks or until released by you surgeon.  °Do not drive while taking narcotics. ° °WEIGHT BEARING °Weight bearing as tolerated with assist device (walker, cane, etc) as directed, use it as long as suggested by your surgeon or therapist, typically at least 4-6 weeks. ° °POSTOPERATIVE CONSTIPATION PROTOCOL °Constipation - defined medically as fewer than three stools per week and severe constipation as less than one stool per week. ° °One of the most common issues patients have following surgery is constipation.  Even if you have a regular bowel pattern at home, your normal regimen is likely to be disrupted due to multiple reasons following surgery.  Combination of anesthesia, postoperative narcotics, change in appetite and fluid intake all can affect your bowels.  In order to avoid complications following surgery, here are some recommendations in order to help you during your recovery period. ° °  Colace (docusate) - Pick up an over-the-counter form of Colace or another stool softener and take twice a day as long as you are requiring postoperative pain medications.  Take with a full glass of water daily.  If you experience loose stools or diarrhea, hold the colace until you stool forms back up.  If your symptoms do not get better within 1 week or if they get worse, check with your  doctor. ° °Dulcolax (bisacodyl) - Pick up over-the-counter and take as directed by the product packaging as needed to assist with the movement of your bowels.  Take with a full glass of water.  Use this product as needed if not relieved by Colace only.  ° °MiraLax (polyethylene glycol) - Pick up over-the-counter to have on hand.  MiraLax is a solution that will increase the amount of water in your bowels to assist with bowel movements.  Take as directed and can mix with a glass of water, juice, soda, coffee, or tea.  Take if you go more than two days without a movement. °Do not use MiraLax more than once per day. Call your doctor if you are still constipated or irregular after using this medication for 7 days in a row. ° °If you continue to have problems with postoperative constipation, please contact the office for further assistance and recommendations.  If you experience "the worst abdominal pain ever" or develop nausea or vomiting, please contact the office immediatly for further recommendations for treatment. ° °ITCHING ° If you experience itching with your medications, try taking only a single pain pill, or even half a pain pill at a time.  You can also use Benadryl over the counter for itching or also to help with sleep.  ° °TED HOSE STOCKINGS °Wear the elastic stockings on both legs for three weeks following surgery during the day but you may remove then at night for sleeping. ° °MEDICATIONS °See your medication summary on the “After Visit Summary” that the nursing staff will review with you prior to discharge.  You may have some home medications which will be placed on hold until you complete the course of blood thinner medication.  It is important for you to complete the blood thinner medication as prescribed by your surgeon.  Continue your approved medications as instructed at time of discharge. ° °PRECAUTIONS °If you experience chest pain or shortness of breath - call 911 immediately for transfer to the  hospital emergency department.  °If you develop a fever greater that 101 F, purulent drainage from wound, increased redness or drainage from wound, foul odor from the wound/dressing, or calf pain - CONTACT YOUR SURGEON.   °                                                °FOLLOW-UP APPOINTMENTS °Make sure you keep all of your appointments after your operation with your surgeon and caregivers. You should call the office at the above phone number and make an appointment for approximately two weeks after the date of your surgery or on the date instructed by your surgeon outlined in the "After Visit Summary". ° ° °RANGE OF MOTION AND STRENGTHENING EXERCISES  °Rehabilitation of the knee is important following a knee injury or an operation. After just a few days of immobilization, the muscles of the thigh which control the knee become weakened and   shrink (atrophy). Knee exercises are designed to build up the tone and strength of the thigh muscles and to improve knee motion. Often times heat used for twenty to thirty minutes before working out will loosen up your tissues and help with improving the range of motion but do not use heat for the first two weeks following surgery. These exercises can be done on a training (exercise) mat, on the floor, on a table or on a bed. Use what ever works the best and is most comfortable for you Knee exercises include:  °Leg Lifts - While your knee is still immobilized in a splint or cast, you can do straight leg raises. Lift the leg to 60 degrees, hold for 3 sec, and slowly lower the leg. Repeat 10-20 times 2-3 times daily. Perform this exercise against resistance later as your knee gets better.  °Quad and Hamstring Sets - Tighten up the muscle on the front of the thigh (Quad) and hold for 5-10 sec. Repeat this 10-20 times hourly. Hamstring sets are done by pushing the foot backward against an object and holding for 5-10 sec. Repeat as with quad sets.  °· Leg Slides: Lying on your back,  slowly slide your foot toward your buttocks, bending your knee up off the floor (only go as far as is comfortable). Then slowly slide your foot back down until your leg is flat on the floor again. °· Angel Wings: Lying on your back spread your legs to the side as far apart as you can without causing discomfort.  °A rehabilitation program following serious knee injuries can speed recovery and prevent re-injury in the future due to weakened muscles. Contact your doctor or a physical therapist for more information on knee rehabilitation.  ° °IF YOU ARE TRANSFERRED TO A SKILLED REHAB FACILITY °If the patient is transferred to a skilled rehab facility following release from the hospital, a list of the current medications will be sent to the facility for the patient to continue.  When discharged from the skilled rehab facility, please have the facility set up the patient's Home Health Physical Therapy prior to being released. Also, the skilled facility will be responsible for providing the patient with their medications at time of release from the facility to include their pain medication, the muscle relaxants, and their blood thinner medication. If the patient is still at the rehab facility at time of the two week follow up appointment, the skilled rehab facility will also need to assist the patient in arranging follow up appointment in our office and any transportation needs. ° °MAKE SURE YOU:  °Understand these instructions.  °Get help right away if you are not doing well or get worse.  ° ° °Pick up stool softner and laxative for home use following surgery while on pain medications. °Do not submerge incision under water. °Please use good hand washing techniques while changing dressing each day. °May shower starting three days after surgery. °Please use a clean towel to pat the incision dry following showers. °Continue to use ice for pain and swelling after surgery. °Do not use any lotions or creams on the incision  until instructed by your surgeon. ° °Information on my medicine - XARELTO® (Rivaroxaban) ° °Why was Xarelto® prescribed for you? °Xarelto® was prescribed for you to reduce the risk of blood clots forming after orthopedic surgery. The medical term for these abnormal blood clots is venous thromboembolism (VTE). ° °What do you need to know about xarelto® ? °Take your Xarelto® ONCE DAILY   at the same time every day. °You may take it either with or without food. ° °If you have difficulty swallowing the tablet whole, you may crush it and mix in applesauce just prior to taking your dose. ° °Take Xarelto® exactly as prescribed by your doctor and DO NOT stop taking Xarelto® without talking to the doctor who prescribed the medication.  Stopping without other VTE prevention medication to take the place of Xarelto® may increase your risk of developing a clot. ° °After discharge, you should have regular check-up appointments with your healthcare provider that is prescribing your Xarelto®.   ° °What do you do if you miss a dose? °If you miss a dose, take it as soon as you remember on the same day then continue your regularly scheduled once daily regimen the next day. Do not take two doses of Xarelto® on the same day.  ° °Important Safety Information °A possible side effect of Xarelto® is bleeding. You should call your healthcare provider right away if you experience any of the following: °? Bleeding from an injury or your nose that does not stop. °? Unusual colored urine (red or dark brown) or unusual colored stools (red or black). °? Unusual bruising for unknown reasons. °? A serious fall or if you hit your head (even if there is no bleeding). ° °Some medicines may interact with Xarelto® and might increase your risk of bleeding while on Xarelto®. To help avoid this, consult your healthcare provider or pharmacist prior to using any new prescription or non-prescription medications, including herbals, vitamins, non-steroidal  anti-inflammatory drugs (NSAIDs) and supplements. ° °This website has more information on Xarelto®: www.xarelto.com. ° ° ° °

## 2018-06-23 NOTE — Anesthesia Preprocedure Evaluation (Signed)
Anesthesia Evaluation  Patient identified by MRN, date of birth, ID band Patient awake    Reviewed: Allergy & Precautions, NPO status , Patient's Chart, lab work & pertinent test results  History of Anesthesia Complications (+) PONV  Airway Mallampati: II  TM Distance: >3 FB Neck ROM: Full    Dental no notable dental hx.    Pulmonary asthma , sleep apnea and Continuous Positive Airway Pressure Ventilation ,    Pulmonary exam normal breath sounds clear to auscultation       Cardiovascular hypertension, Normal cardiovascular exam Rhythm:Regular Rate:Normal     Neuro/Psych negative neurological ROS  negative psych ROS   GI/Hepatic Neg liver ROS, GERD  Medicated,  Endo/Other  negative endocrine ROS  Renal/GU negative Renal ROS  negative genitourinary   Musculoskeletal negative musculoskeletal ROS (+)   Abdominal   Peds negative pediatric ROS (+)  Hematology negative hematology ROS (+)   Anesthesia Other Findings   Reproductive/Obstetrics negative OB ROS                             Anesthesia Physical Anesthesia Plan  ASA: III  Anesthesia Plan: Spinal   Post-op Pain Management:    Induction: Intravenous  PONV Risk Score and Plan: 3 and Ondansetron, Dexamethasone and Treatment may vary due to age or medical condition  Airway Management Planned: Simple Face Mask  Additional Equipment:   Intra-op Plan:   Post-operative Plan:   Informed Consent: I have reviewed the patients History and Physical, chart, labs and discussed the procedure including the risks, benefits and alternatives for the proposed anesthesia with the patient or authorized representative who has indicated his/her understanding and acceptance.   Dental advisory given  Plan Discussed with: CRNA and Surgeon  Anesthesia Plan Comments:         Anesthesia Quick Evaluation

## 2018-06-24 ENCOUNTER — Encounter (HOSPITAL_COMMUNITY): Payer: Self-pay | Admitting: Orthopedic Surgery

## 2018-06-24 LAB — CBC
HEMATOCRIT: 37.2 % (ref 36.0–46.0)
HEMOGLOBIN: 11.8 g/dL — AB (ref 12.0–15.0)
MCH: 30.8 pg (ref 26.0–34.0)
MCHC: 31.7 g/dL (ref 30.0–36.0)
MCV: 97.1 fL (ref 78.0–100.0)
Platelets: 151 10*3/uL (ref 150–400)
RBC: 3.83 MIL/uL — ABNORMAL LOW (ref 3.87–5.11)
RDW: 13.6 % (ref 11.5–15.5)
WBC: 6.5 10*3/uL (ref 4.0–10.5)

## 2018-06-24 LAB — BASIC METABOLIC PANEL
Anion gap: 7 (ref 5–15)
BUN: 19 mg/dL (ref 8–23)
CHLORIDE: 105 mmol/L (ref 98–111)
CO2: 27 mmol/L (ref 22–32)
CREATININE: 0.75 mg/dL (ref 0.44–1.00)
Calcium: 8.7 mg/dL — ABNORMAL LOW (ref 8.9–10.3)
GFR calc Af Amer: >60 mL/min (ref >60)
GFR calc non Af Amer: >60 mL/min (ref >60)
Glucose, Bld: 157 mg/dL — ABNORMAL HIGH (ref 70–99)
Potassium: 4.9 mmol/L (ref 3.5–5.1)
Sodium: 139 mmol/L (ref 135–145)

## 2018-06-24 MED ORDER — SODIUM CHLORIDE 0.9 % IV BOLUS
500.0000 mL | Freq: Once | INTRAVENOUS | Status: AC
  Administered 2018-06-24: 500 mL via INTRAVENOUS

## 2018-06-24 MED ORDER — HYDROMORPHONE HCL 2 MG PO TABS
2.0000 mg | ORAL_TABLET | ORAL | Status: DC | PRN
  Administered 2018-06-25 – 2018-06-26 (×2): 4 mg via ORAL
  Administered 2018-06-27 – 2018-06-28 (×2): 2 mg via ORAL
  Filled 2018-06-24: qty 1
  Filled 2018-06-24 (×2): qty 2
  Filled 2018-06-24: qty 1

## 2018-06-24 NOTE — Evaluation (Signed)
Occupational Therapy Evaluation Patient Details Name: [name] MRN: [id number] DOB: [birth year] Today's Date: 06/24/2018    History of Present Illness Pt is 77 YO female s/p R TKR on 06/23/18. PMH includes HTN, OA, RA, fibromyalgia, glaucoma, OP, sleep apnea, depression, hearing loss, bradycardia. Past surgical history includes cardiac cath, gastric resection, L TKR 2018.   Clinical Impression   Pt was admitted for the above.  At baseline, she is mod independent with adls.  She will have intermittent assistance but wants to be as independent as possible. Will follow in acute setting with mod I level goals    Follow Up Recommendations  Supervision - Intermittent    Equipment Recommendations  None recommended by OT    Recommendations for Other Services       Precautions / Restrictions Precautions Precautions: Fall;Knee Required Braces or Orthoses: Knee Immobilizer - Right Knee Immobilizer - Right: On when out of bed or walking;Discontinue once straight leg raise with < 10 degree lag Restrictions Other Position/Activity Restrictions: WBAT       Mobility Bed Mobility         Supine to sit: Min guard     General bed mobility comments: using gaitbelt  Transfers   Equipment used: Rolling walker (2 wheeled)   Sit to Stand: Min guard         General transfer comment: for safety    Balance                                           ADL either performed or assessed with clinical judgement   ADL Overall ADL's : Needs assistance/impaired Eating/Feeding: Independent   Grooming: Set up;Sitting   Upper Body Bathing: Set up;Sitting   Lower Body Bathing: Minimal assistance;Sit to/from stand   Upper Body Dressing : Set up;Sitting   Lower Body Dressing: Minimal assistance;Sit to/from stand(with sock aide)   Toilet Transfer: Min guard;Ambulation;BSC;RW   Toileting- Water quality scientist and Hygiene: Min guard;Sit to/from stand         General  ADL Comments: ambulated to bathroom and used toilet.  Used gait belt to lift leg and donned KI with light min A to position. Also used gait belt to self assist leg OOB to decrease pain. Pain increased with WB.  Used sock aide, educated on Secondary school teacher and used this for underwear.  She will need daughter to don ted hose.  Good safety awareness     Vision         Perception     Praxis      Pertinent Vitals/Pain Pain Assessment: 0-10 Pain Score: 10-Worst pain ever(after walking to bathroom) Pain Location: R knee Pain Descriptors / Indicators: Aching Pain Intervention(s): Limited activity within patient's tolerance;Monitored during session;Premedicated before session;Repositioned;Ice applied     Hand Dominance Right   Extremity/Trunk Assessment Upper Extremity Assessment Upper Extremity Assessment: Overall WFL for tasks assessed           Communication Communication Communication: No difficulties   Cognition Arousal/Alertness: Awake/alert Behavior During Therapy: WFL for tasks assessed/performed Overall Cognitive Status: Within Functional Limits for tasks assessed                                     General Comments       Exercises     Shoulder Instructions  Home Living Family/patient expects to be discharged to:: Private residence Living Arrangements: Spouse/significant other Available Help at Discharge: Family;Available PRN/intermittently               Bathroom Shower/Tub: Tub/shower unit   Bathroom Toilet: Standard     Home Equipment: Environmental consultant - 2 wheels;Bedside commode;Cane - single point;Tub bench   Additional Comments: daughter in before she works and after. Husband has dementia, can't assist      Prior Functioning/Environment Level of Independence: Independent with assistive device(s)  Gait / Transfers Assistance Needed: pt used cane most times PTA, occasionally would use RW.  ADL's / Homemaking Assistance Needed: difficulty with  cleaning prior to admission             OT Problem List: Decreased activity tolerance;Pain;Decreased knowledge of use of DME or AE      OT Treatment/Interventions: Self-care/ADL training;DME and/or AE instruction;Patient/family education    OT Goals(Current goals can be found in the care plan section) Acute Rehab OT Goals Patient Stated Goal: be independent OT Goal Formulation: With patient Time For Goal Achievement: 07/08/18 Potential to Achieve Goals: Good ADL Goals Pt Will Transfer to Toilet: with modified independence;bedside commode;ambulating Additional ADL Goal #1: pt will perform bathing/dressing at mod I level, washing at sink, except for ted hose  OT Frequency: Min 2X/week   Barriers to D/C:            Co-evaluation              AM-PAC PT "6 Clicks" Daily Activity     Outcome Measure Help from another person eating meals?: None Help from another person taking care of personal grooming?: A Little Help from another person toileting, which includes using toliet, bedpan, or urinal?: A Little Help from another person bathing (including washing, rinsing, drying)?: A Little Help from another person to put on and taking off regular upper body clothing?: A Little Help from another person to put on and taking off regular lower body clothing?: A Lot 6 Click Score: 18   End of Session    Activity Tolerance: Patient limited by pain Patient left: in chair;with chair alarm set;with call bell/phone within reach  OT Visit Diagnosis: Pain Pain - Right/Left: Right Pain - part of body: Knee                Time: 9798-9211 OT Time Calculation (min): 44 min Charges:  OT General Charges $OT Visit: 1 Visit OT Evaluation $OT Eval Low Complexity: 1 Low OT Treatments $Self Care/Home Management : 23-37 mins  Lesle Chris, OTR/L [phone] 06/24/2018  [place] 06/24/2018, 12:38 PM

## 2018-06-24 NOTE — Progress Notes (Signed)
Paged provider on call. Pt HR dropping into the low 30s when sleeping. Pt is arousable and in no acute distress. When woken up, pt pulse returns to between 43-52.

## 2018-06-24 NOTE — Progress Notes (Addendum)
Spoke with PA, wanted a bolus for pt HR. And requested to be called if HR continues to stay in the low 30s.  Bolus started, will CTM  0435- Spoke with PA, RE: no change in HR after bolus. pt in no distress, pt remains stable. She informed MD, no new orders at this time.

## 2018-06-24 NOTE — Progress Notes (Signed)
Physical Therapy Treatment Patient Details Name: [name] MRN: [id number] DOB: 06-07-1941 Today's Date: 06/24/2018    History of Present Illness Pt is 77 YO female s/p R TKR on 06/23/18. PMH includes HTN, OA, RA, fibromyalgia, glaucoma, OP, sleep apnea, depression, hearing loss, bradycardia. Past surgical history includes cardiac cath, gastric resection, L TKR 2018.    PT Comments    POD # 1 pm session Assisted OOB to amb to bathroom then an increased distance in hallway.  Assisted back to bed and performed a few TE's followed by ICE. Pt progressing better and plans to D/C to home tomorrow.   Follow Up Recommendations  Follow surgeon's recommendation for DC plan and follow-up therapies;Supervision for mobility/OOB(VERA)     Equipment Recommendations  None recommended by PT    Recommendations for Other Services       Precautions / Restrictions Precautions Precautions: Fall;Knee Required Braces or Orthoses: Knee Immobilizer - Right Knee Immobilizer - Right: On when out of bed or walking;Discontinue once straight leg raise with < 10 degree lag Restrictions Weight Bearing Restrictions: No Other Position/Activity Restrictions: WBAT     Mobility  Bed Mobility Overal bed mobility: Needs Assistance Bed Mobility: Supine to Sit;Sit to Supine     Supine to sit: Supervision;Min guard     General bed mobility comments: assisted OOB and back into bed  Transfers Overall transfer level: Needs assistance Equipment used: Rolling walker (2 wheeled) Transfers: Sit to/from Stand Sit to Stand: Min guard;Supervision         General transfer comment: 25% VC's on safety with turns and hand placement  also assisted with toilet transfer  Ambulation/Gait Ambulation/Gait assistance: Min guard;Min assist Gait Distance (Feet): 55 Feet Assistive device: Rolling walker (2 wheeled) Gait Pattern/deviations: Step-to pattern;Decreased stride length;Decreased weight shift to right;Decreased  stance time - right;Antalgic;Trunk flexed Gait velocity: decreased   General Gait Details: tolerated an increased distance   Marine scientist Rankin (Stroke Patients Only)       Balance                                            Cognition Arousal/Alertness: Awake/alert Behavior During Therapy: WFL for tasks assessed/performed Overall Cognitive Status: Within Functional Limits for tasks assessed                                        Exercises      General Comments        Pertinent Vitals/Pain Pain Assessment: 0-10 Pain Score: 5  Pain Location: R knee Pain Descriptors / Indicators: Aching;Operative site guarding;Discomfort Pain Intervention(s): Monitored during session;Premedicated before session;Repositioned;Ice applied    Home Living                      Prior Function            PT Goals (current goals can now be found in the care plan section) Progress towards PT goals: Progressing toward goals    Frequency    7X/week      PT Plan Current plan remains appropriate    Co-evaluation              AM-PAC PT "  6 Clicks" Daily Activity  Outcome Measure  Difficulty turning over in bed (including adjusting bedclothes, sheets and blankets)?: A Lot Difficulty moving from lying on back to sitting on the side of the bed? : A Lot Difficulty sitting down on and standing up from a chair with arms (e.g., wheelchair, bedside commode, etc,.)?: A Lot Help needed moving to and from a bed to chair (including a wheelchair)?: A Lot Help needed walking in hospital room?: A Lot Help needed climbing 3-5 steps with a railing? : A Lot 6 Click Score: 12    End of Session Equipment Utilized During Treatment: Gait belt Activity Tolerance: Patient tolerated treatment well Patient left: in chair;with chair alarm set;with call bell/phone within reach;with family/visitor present;with  SCD's reapplied Nurse Communication: Mobility status PT Visit Diagnosis: Other abnormalities of gait and mobility (R26.89);Difficulty in walking, not elsewhere classified (R26.2)     Time: 1335-1400 PT Time Calculation (min) (ACUTE ONLY): 25 min  Charges:  $Gait Training: 8-22 mins $Therapeutic Activity: 8-22 mins                     Rica Koyanagi  PTA Acute  Rehabilitation Services Pager      [phone] Office      [phone]

## 2018-06-24 NOTE — Progress Notes (Addendum)
Subjective: 1 Day Post-Op Procedure(s) (LRB): RIGHT TOTAL KNEE ARTHROPLASTY (Right) Patient reports pain as mild.   Patient seen in rounds by Dr. Wynelle Link. Patient is having issues with bradycardia. HR dropped to 30s while sleeping last night. 500 mL ordered ensure intravascular volume was adequate. BP stable, and HR improved to 40s-low 50s upon awakening. Pt was asymptomatic and stable. History of bradycardia, which she has been evaluated for in the past. Will address this today.  We will continue therapy today.   Objective: Vital signs in last 24 hours: Temp:  [97.4 F (36.3 C)-98.7 F (37.1 C)] 97.6 F (36.4 C) (09/10 0453) Pulse Rate:  [39-57] 43 (09/10 0453) Resp:  [8-18] 16 (09/10 0453) BP: (104-159)/(47-72) 123/47 (09/10 0453) SpO2:  [98 %-100 %] 98 % (09/10 0453) Weight:  [89.8 kg] 89.8 kg (09/09 0940)  Intake/Output from previous day:  Intake/Output Summary (Last 24 hours) at 06/24/2018 0802 Last data filed at 06/24/2018 0458 Gross per 24 hour  Intake 3741.35 ml  Output 1770 ml  Net 1971.35 ml     Labs: Recent Labs    06/24/18 0436  HGB 11.8*   Recent Labs    06/24/18 0436  WBC 6.5  RBC 3.83*  HCT 37.2  PLT 151   Recent Labs    06/24/18 0436  NA 139  K 4.9  CL 105  CO2 27  BUN 19  CREATININE 0.75  GLUCOSE 157*  CALCIUM 8.7*    Exam: General - Patient is Alert and Oriented Extremity - Neurologically intact Neurovascular intact Sensation intact distally Dorsiflexion/Plantar flexion intact Dressing - dressing C/D/I Motor Function - intact, moving foot and toes well on exam.   Past Medical History:  Diagnosis Date  . Anal or rectal pain    sometimes  . Anemia    hx of during pregnancy  . Arthritis   . Asthma    hx of  . Bradycardia    " I KNOW I HAVE BRADYCARDIA ESPECIALLY WHEN I SLEEP"   . Chronic back pain   . Degenerative joint disease    osteo  . Depression   . Diverticulosis 2003  . Dysrhythmia    hx of  due to eye drop and  also low heart rate 40's per pt. Dr. Gwenlyn Found follows  . Elevated total protein   . Esophageal dysmotility   . Fibromyalgia   . GERD (gastroesophageal reflux disease)    subsequent Nissen Fundoplication  . Glaucoma   . Hearing loss   . Hemorrhoids   . Hiatal hernia 11/08/09  . Hx of adenomatous colonic polyps 07/02/02  . Hypercalcemia   . Hyperlipidemia   . Hyperlipidemia   . Hypertension   . Nausea   . Osteoporosis   . PONV (postoperative nausea and vomiting)   . Rectal bleeding    from hemorrhoids.    . Sleep apnea    DOES USE CPAP   . Thrombocytopenia ([place])   . Varicose veins of left lower extremity     Assessment/Plan: 1 Day Post-Op Procedure(s) (LRB): RIGHT TOTAL KNEE ARTHROPLASTY (Right) Active Problems:   OA (osteoarthritis) of knee  Estimated body mass index is 28.41 kg/m as calculated from the following:   Height as of this encounter: 5\' 10"  (1.778 m).   Weight as of this encounter: 89.8 kg. Advance diet Up with therapy  Anticipated LOS equal to or greater than 2 midnights due to - Age 76 and older with one or more of the following:  - Obesity  -  Expected need for hospital services (PT, OT, Nursing) required for safe  discharge  - Anticipated need for postoperative skilled nursing care or inpatient rehab  - Active co-morbidities: Cardiac Arrhythmia OR   - Unanticipated findings during/Post Surgery: None  - Patient is a high risk of re-admission due to: None    DVT Prophylaxis - Xarelto Weight bearing as tolerated. D/C O2 and pulse ox and try on room air. Hemovac pulled without difficulty, will begin therapy today.  12-lead EKG ordered. If shows arrhythmia other than sinus bradycardia, will obtain cardiology consult for management of this. Otherwise, will continue to monitor without consult as pt is stable with no BP issues and asymptomatic. May continue losartan as it contains no beta-blocker properties.   ADDENDUM: EKG came back as sinus bradycardia, no  evidence of a block. Will continue to monitor.  Plan is to go Home after hospital stay.  Theresa Duty, PA-C Orthopedic Surgery 06/24/2018, 8:02 AM

## 2018-06-24 NOTE — Progress Notes (Signed)
Physical Therapy Treatment Patient Details Name: [name] MRN: [id number] DOB: [birth year] Today's Date: 06/24/2018    History of Present Illness Pt is 77 YO female s/p R TKR on 06/23/18. PMH includes HTN, OA, RA, fibromyalgia, glaucoma, OP, sleep apnea, depression, hearing loss, bradycardia. Past surgical history includes cardiac cath, gastric resection, L TKR 2018.    PT Comments     POD # 1 am session Assisted with amb to and from bathroom only.   No nausea.  No dizziness.  Just feels "light".  Assisted with TKR TE's followed by ICE    Follow Up Recommendations  Follow surgeon's recommendation for DC plan and follow-up therapies;Supervision for mobility/OOB(VERA)     Equipment Recommendations  None recommended by PT    Recommendations for Other Services       Precautions / Restrictions Precautions Precautions: Fall;Knee Required Braces or Orthoses: Knee Immobilizer - Right Knee Immobilizer - Right: On when out of bed or walking;Discontinue once straight leg raise with < 10 degree lag Restrictions Weight Bearing Restrictions: No Other Position/Activity Restrictions: WBAT     Mobility  Bed Mobility Overal bed mobility: Needs Assistance Bed Mobility: Supine to Sit     Supine to sit: Supervision;Min guard     General bed mobility comments: using gaitbelt  Transfers Overall transfer level: Needs assistance Equipment used: Rolling walker (2 wheeled) Transfers: Sit to/from Stand Sit to Stand: Min guard;Supervision         General transfer comment: 25% VC's on safety with turns and hand placement  Ambulation/Gait Ambulation/Gait assistance: Min guard;Min assist Gait Distance (Feet): 20 Feet Assistive device: Rolling walker (2 wheeled) Gait Pattern/deviations: Step-to pattern;Decreased stride length;Decreased weight shift to right;Decreased stance time - right;Antalgic;Trunk flexed Gait velocity: decreased   General Gait Details: amb to and from bathroom  only due to unsteadiness.  No nausea.  No dizziness.  Just feels "light".    Stairs             Wheelchair Mobility    Modified Rankin (Stroke Patients Only)       Balance                                            Cognition Arousal/Alertness: Awake/alert Behavior During Therapy: WFL for tasks assessed/performed Overall Cognitive Status: Within Functional Limits for tasks assessed                                        Exercises   Total Knee Replacement TE's 10 reps B LE ankle pumps 10 reps towel squeezes 10 reps knee presses 10 reps heel slides  10 reps SAQ's 10 reps SLR's 10 reps ABD Followed by ICE    General Comments        Pertinent Vitals/Pain Pain Assessment: 0-10 Pain Score: 5  Pain Location: R knee Pain Descriptors / Indicators: Aching;Operative site guarding;Discomfort Pain Intervention(s): Monitored during session;Premedicated before session;Repositioned;Ice applied    Home Living                      Prior Function            PT Goals (current goals can now be found in the care plan section) Progress towards PT goals: Progressing toward goals    Frequency  7X/week      PT Plan Current plan remains appropriate    Co-evaluation              AM-PAC PT "6 Clicks" Daily Activity  Outcome Measure  Difficulty turning over in bed (including adjusting bedclothes, sheets and blankets)?: A Lot Difficulty moving from lying on back to sitting on the side of the bed? : A Lot Difficulty sitting down on and standing up from a chair with arms (e.g., wheelchair, bedside commode, etc,.)?: A Lot Help needed moving to and from a bed to chair (including a wheelchair)?: A Lot Help needed walking in hospital room?: A Lot Help needed climbing 3-5 steps with a railing? : A Lot 6 Click Score: 12    End of Session Equipment Utilized During Treatment: Gait belt Activity Tolerance: Patient tolerated  treatment well Patient left: in chair;with chair alarm set;with call bell/phone within reach;with family/visitor present;with SCD's reapplied Nurse Communication: Mobility status PT Visit Diagnosis: Other abnormalities of gait and mobility (R26.89);Difficulty in walking, not elsewhere classified (R26.2)     Time: 1335-1400 PT Time Calculation (min) (ACUTE ONLY): 25 min  Charges:  $Gait Training: 8-22 mins $Therapeutic Exercise: 8-22 mins                     Rica Koyanagi  PTA Acute  Rehabilitation Services Pager      [phone] Office      [phone]

## 2018-06-25 LAB — CBC
HEMATOCRIT: 34.8 % — AB (ref 36.0–46.0)
HEMOGLOBIN: 11.1 g/dL — AB (ref 12.0–15.0)
MCH: 30.8 pg (ref 26.0–34.0)
MCHC: 31.9 g/dL (ref 30.0–36.0)
MCV: 96.7 fL (ref 78.0–100.0)
Platelets: 144 10*3/uL — ABNORMAL LOW (ref 150–400)
RBC: 3.6 MIL/uL — ABNORMAL LOW (ref 3.87–5.11)
RDW: 13.7 % (ref 11.5–15.5)
WBC: 8.4 10*3/uL (ref 4.0–10.5)

## 2018-06-25 LAB — BASIC METABOLIC PANEL
ANION GAP: 7 (ref 5–15)
BUN: 16 mg/dL (ref 8–23)
CHLORIDE: 104 mmol/L (ref 98–111)
CO2: 29 mmol/L (ref 22–32)
Calcium: 9.1 mg/dL (ref 8.9–10.3)
Creatinine, Ser: 0.85 mg/dL (ref 0.44–1.00)
GFR calc non Af Amer: >60 mL/min (ref >60)
Glucose, Bld: 128 mg/dL — ABNORMAL HIGH (ref 70–99)
POTASSIUM: 4.8 mmol/L (ref 3.5–5.1)
Sodium: 140 mmol/L (ref 135–145)

## 2018-06-25 LAB — GLUCOSE, CAPILLARY: Glucose-Capillary: 116 mg/dL — ABNORMAL HIGH (ref 70–99)

## 2018-06-25 MED ORDER — HYDROMORPHONE HCL 2 MG PO TABS: 2.0000 mg | ORAL_TABLET | Freq: Four times a day (QID) | ORAL | 0 refills | Status: DC | PRN

## 2018-06-25 MED ORDER — METHOCARBAMOL 500 MG PO TABS: 500.0000 mg | ORAL_TABLET | Freq: Four times a day (QID) | ORAL | 0 refills | Status: DC | PRN

## 2018-06-25 MED ORDER — RIVAROXABAN 10 MG PO TABS: 10.0000 mg | ORAL_TABLET | Freq: Every day | ORAL | 0 refills | Status: DC

## 2018-06-25 MED ORDER — GABAPENTIN 300 MG PO CAPS: 300.0000 mg | ORAL_CAPSULE | Freq: Three times a day (TID) | ORAL | 0 refills | Status: DC

## 2018-06-25 MED ORDER — TRAMADOL HCL 50 MG PO TABS: 50.0000 mg | ORAL_TABLET | Freq: Four times a day (QID) | ORAL | 0 refills | Status: DC | PRN

## 2018-06-25 NOTE — Care Management (Signed)
DC plan per patient: Virtual PT. No DME needs.  Marney Doctor RN,BSN [phone]

## 2018-06-25 NOTE — Progress Notes (Signed)
Patient transfered to 1441. Husband and daughter in room/present at time of transfer.

## 2018-06-25 NOTE — Progress Notes (Addendum)
Subjective: 2 Days Post-Op Procedure(s) (LRB): RIGHT TOTAL KNEE ARTHROPLASTY (Right) Patient reports pain as moderate.   Patient seen in rounds with Dr. Wynelle Link. Patient is well, and has had no acute complaints or problems other than moderate pain in the right knee overnight. Has improved with PO pain medications. States she is ready to go home. Denies chest pain, SOB or calf pain. Voiding without difficulty and positive flatus.  Plan is to go Home after hospital stay.  Objective: Vital signs in last 24 hours: Temp:  [97.4 F (36.3 C)-98.3 F (36.8 C)] 97.4 F (36.3 C) (09/11 0423) Pulse Rate:  [43-60] 60 (09/11 0423) Resp:  [17-18] 17 (09/11 0423) BP: (121-159)/(55-65) 159/65 (09/11 0423) SpO2:  [94 %-97 %] 97 % (09/11 0423)  Intake/Output from previous day:  Intake/Output Summary (Last 24 hours) at 06/25/2018 0751 Last data filed at 06/25/2018 0600 Gross per 24 hour  Intake 3167.12 ml  Output 1500 ml  Net 1667.12 ml    Labs: Recent Labs    06/24/18 0436 06/25/18 0539  HGB 11.8* 11.1*   Recent Labs    06/24/18 0436 06/25/18 0539  WBC 6.5 8.4  RBC 3.83* 3.60*  HCT 37.2 34.8*  PLT 151 144*   Recent Labs    06/24/18 0436 06/25/18 0539  NA 139 140  K 4.9 4.8  CL 105 104  CO2 27 29  BUN 19 16  CREATININE 0.75 0.85  GLUCOSE 157* 128*  CALCIUM 8.7* 9.1   Exam: General - Patient is Alert and Oriented Extremity - Neurologically intact Neurovascular intact Sensation intact distally Dorsiflexion/Plantar flexion intact Dressing/Incision - clean, dry, no drainage Motor Function - intact, moving foot and toes well on exam.   Past Medical History:  Diagnosis Date  . Anal or rectal pain    sometimes  . Anemia    hx of during pregnancy  . Arthritis   . Asthma    hx of  . Bradycardia    " I KNOW I HAVE BRADYCARDIA ESPECIALLY WHEN I SLEEP"   . Chronic back pain   . Degenerative joint disease    osteo  . Depression   . Diverticulosis 2003  .  Dysrhythmia    hx of  due to eye drop and also low heart rate 40's per pt. Dr. Gwenlyn Found follows  . Elevated total protein   . Esophageal dysmotility   . Fibromyalgia   . GERD (gastroesophageal reflux disease)    subsequent Nissen Fundoplication  . Glaucoma   . Hearing loss   . Hemorrhoids   . Hiatal hernia 11/08/09  . Hx of adenomatous colonic polyps 07/02/02  . Hypercalcemia   . Hyperlipidemia   . Hyperlipidemia   . Hypertension   . Nausea   . Osteoporosis   . PONV (postoperative nausea and vomiting)   . Rectal bleeding    from hemorrhoids.    . Sleep apnea    DOES USE CPAP   . Thrombocytopenia ([place])   . Varicose veins of left lower extremity     Assessment/Plan: 2 Days Post-Op Procedure(s) (LRB): RIGHT TOTAL KNEE ARTHROPLASTY (Right) Active Problems:   OA (osteoarthritis) of knee  Estimated body mass index is 28.41 kg/m as calculated from the following:   Height as of this encounter: 5\' 10"  (1.778 m).   Weight as of this encounter: 89.8 kg. Up with therapy D/C IV fluids  DVT Prophylaxis - Xarelto Weight-bearing as tolerated  Heart rate has stayed between mid-40s to 60 yesterday and overnight.  Pt stable and ready for discharge. Plan for d/c to home today with virtual physical therapy. If meeting goals with therapy may discharge around lunchtime, otherwise will complete an additional session. Follow-up in the office in 2 weeks with Dr. Wynelle Link for first post-operative visit.   ADDENDUM: Pt has had slower progression with physical therapy, is not yet meeting goals to be cleared for discharge. Will stay overnight and continue working with therapy tomorrow.   Theresa Duty, PA-C Orthopedic Surgery 06/25/2018, 7:51 AM

## 2018-06-25 NOTE — Progress Notes (Signed)
Occupational Therapy Treatment Patient Details Name: [name] MRN: [id number] DOB: 1941/01/10 Today's Date: 06/25/2018    History of present illness Pt is 77 YO female s/p R TKR on 06/23/18. PMH includes HTN, OA, RA, fibromyalgia, glaucoma, OP, sleep apnea, depression, hearing loss, bradycardia. Past surgical history includes cardiac cath, gastric resection, L TKR 2018.   OT comments  Pt limited by nausea. Pt was already set up by nursing for adl.  Educated on setting herself up.  If she remains here, tomorrow, I will have her do this herself.  Follow Up Recommendations  Supervision - Intermittent;Follow surgeon's recommendation for DC plan and follow-up therapies    Equipment Recommendations  None recommended by OT    Recommendations for Other Services      Precautions / Restrictions Precautions Precautions: Fall;Knee Required Braces or Orthoses: Knee Immobilizer - Right Knee Immobilizer - Right: On when out of bed or walking;Discontinue once straight leg raise with < 10 degree lag Restrictions Weight Bearing Restrictions: No Other Position/Activity Restrictions: WBAT        Mobility Bed Mobility               General bed mobility comments: oob  Transfers   Equipment used: Rolling walker (2 wheeled)   Sit to Stand: Supervision              Balance                                           ADL either performed or assessed with clinical judgement   ADL                           Toilet Transfer: Supervision/safety;Ambulation;BSC;RW   Toileting- Clothing Manipulation and Hygiene: Supervision/safety;Sit to/from stand         General ADL Comments: pt was set up by nursing in bathroom.  Educated her on how to carry supplies into bathroom, stand to wash peri area, then bring 2 cloths with her to toilet and perform rest of bathing (and dress from there)  Pt did not have KI on and pain was very high.  Applied this and walked to  chair. Pt felt nauseous--RN brought meds.  Brought her sock aide for home and references if she wants to buy a compression stocking aide     Vision       Perception     Praxis      Cognition Arousal/Alertness: Awake/alert Behavior During Therapy: WFL for tasks assessed/performed Overall Cognitive Status: Within Functional Limits for tasks assessed                                          Exercises     Shoulder Instructions       General Comments      Pertinent Vitals/ Pain       Pain Score: 10-Worst pain ever(with weight bearing) Pain Location: R knee Pain Descriptors / Indicators: Aching;Operative site guarding;Discomfort Pain Intervention(s): Limited activity within patient's tolerance;Monitored during session;Premedicated before session;Repositioned  Home Living  Prior Functioning/Environment              Frequency  Min 2X/week        Progress Toward Goals  OT Goals(current goals can now be found in the care plan section)  Progress towards OT goals: Progressing toward goals     Plan      Co-evaluation                 AM-PAC PT "6 Clicks" Daily Activity     Outcome Measure   Help from another person eating meals?: None Help from another person taking care of personal grooming?: A Little Help from another person toileting, which includes using toliet, bedpan, or urinal?: A Little Help from another person bathing (including washing, rinsing, drying)?: A Little Help from another person to put on and taking off regular upper body clothing?: A Little Help from another person to put on and taking off regular lower body clothing?: A Lot 6 Click Score: 18    End of Session    OT Visit Diagnosis: Pain Pain - Right/Left: Right Pain - part of body: Knee   Activity Tolerance Other (comment)   Patient Left in chair;with chair alarm set;with call bell/phone within  reach;with nursing/sitter in room   Nurse Communication          Time: 2703-5009 OT Time Calculation (min): 22 min  Charges: OT General Charges $OT Visit: 1 Visit OT Treatments $Self Care/Home Management : 8-22 mins  Lesle Chris, OTR/L [phone] 06/25/2018   Sadie Pickar 06/25/2018, 11:28 AM

## 2018-06-25 NOTE — Progress Notes (Signed)
Patient lethargic, eyes rolling back in head, heart rate 202, but can answer questions. Rapid response called.

## 2018-06-25 NOTE — Progress Notes (Signed)
Patient responsive, oriented. Patient to transfer to telemetry.

## 2018-06-25 NOTE — Progress Notes (Signed)
Physical Therapy Treatment Patient Details Name: [name] MRN: [id number] DOB: 09-28-1941 Today's Date: 06/25/2018    History of Present Illness Pt is 77 YO female s/p R TKR on 06/23/18. PMH includes HTN, OA, RA, fibromyalgia, glaucoma, OP, sleep apnea, depression, hearing loss, bradycardia. Past surgical history includes cardiac cath, gastric resection, L TKR 2018.    PT Comments    POD # 2 am session withheld due to max c/o nausea when amb to bathroom with NT. Pm session pt was unable to progress gait distance and perform stair training due to 10/10 knee pain.  Limited WBing tolerance and too unsteady to attempt stairs.  Assisted to [facility] then back to bed.  RN called to room for pain meds.  Applied ICE.  Pt did not meet her PT goals due to pain management.   Follow Up Recommendations  Follow surgeon's recommendation for DC plan and follow-up therapies;Supervision for mobility/OOB(VERA)     Equipment Recommendations  None recommended by PT    Recommendations for Other Services       Precautions / Restrictions Precautions Precautions: Fall;Knee Precaution Comments: instructed on KI use for increased support until able SLR Required Braces or Orthoses: Knee Immobilizer - Right Knee Immobilizer - Right: On when out of bed or walking;Discontinue once straight leg raise with < 10 degree lag Restrictions Weight Bearing Restrictions: No Other Position/Activity Restrictions: WBAT     Mobility  Bed Mobility Overal bed mobility: Needs Assistance Bed Mobility: Sit to Supine       Sit to supine: Min assist;Mod assist   General bed mobility comments: assist R LE up onto bed and position to comfort  Transfers Overall transfer level: Needs assistance Equipment used: Rolling walker (2 wheeled) Transfers: Sit to/from Stand Sit to Stand: Min guard;Min assist         General transfer comment: required increased assist due to increased pain level   assisted with toilet transfer as  well  Ambulation/Gait Ambulation/Gait assistance: Min assist Gait Distance (Feet): 12 Feet Assistive device: Rolling walker (2 wheeled) Gait Pattern/deviations: Step-to pattern;Decreased stride length;Decreased weight shift to right;Decreased stance time - right;Antalgic;Trunk flexed Gait velocity: decreased   General Gait Details: decreased distance due to increased pain today > yesterday.  RN in room and observed   Marine scientist Rankin (Stroke Patients Only)       Balance                                            Cognition Arousal/Alertness: Awake/alert Behavior During Therapy: WFL for tasks assessed/performed Overall Cognitive Status: Within Functional Limits for tasks assessed                                        Exercises      General Comments        Pertinent Vitals/Pain Pain Assessment: 0-10 Pain Score: 10-Worst pain ever Pain Location: R knee Pain Descriptors / Indicators: Crying;Grimacing;Sore;Tender Pain Intervention(s): Monitored during session;Repositioned;Patient requesting pain meds-RN notified;Ice applied    Home Living                      Prior Function  PT Goals (current goals can now be found in the care plan section) Progress towards PT goals: Progressing toward goals    Frequency           PT Plan Current plan remains appropriate    Co-evaluation              AM-PAC PT "6 Clicks" Daily Activity  Outcome Measure  Difficulty turning over in bed (including adjusting bedclothes, sheets and blankets)?: A Lot Difficulty moving from lying on back to sitting on the side of the bed? : A Lot Difficulty sitting down on and standing up from a chair with arms (e.g., wheelchair, bedside commode, etc,.)?: A Lot Help needed moving to and from a bed to chair (including a wheelchair)?: A Lot Help needed walking in hospital room?: A  Lot Help needed climbing 3-5 steps with a railing? : A Lot 6 Click Score: 12    End of Session Equipment Utilized During Treatment: Gait belt Activity Tolerance: Patient limited by pain Patient left: in bed;with call bell/phone within reach;with bed alarm set Nurse Communication: Mobility status PT Visit Diagnosis: Other abnormalities of gait and mobility (R26.89);Difficulty in walking, not elsewhere classified (R26.2)     Time: 8022-3361 PT Time Calculation (min) (ACUTE ONLY): 28 min  Charges:  $Gait Training: 8-22 mins $Therapeutic Activity: 8-22 mins                     Rica Koyanagi  PTA Acute  Rehabilitation Services Pager      [phone] Office      [phone]

## 2018-06-25 NOTE — Progress Notes (Signed)
MD paged regarding rapid response.

## 2018-06-25 NOTE — Significant Event (Signed)
Rapid Response Event Note  Overview: Time Called: 2245 Arrival Time: 2248 Event Type: Cardiac  Initial Focused Assessment: Patient sitting on commode in bathroom. Patient initially not arousable/unresponsive. Per primary RN, patient was helped by family member up to bathroom with assistance of a walker. When staff checked on patient, patient was found to be unresponsive sitting on commode. Per family, patient was attempting to have bowel movement. No BM noted in commode. Patient HR 80 bpm upon initial assessment. 3LNC applied, oxygen saturation 93% on nasal cannula. Pupils equal, 58mm in size, round, sluggish in reaction. Patient did begin to speak giving one word responses, and progressively began opening her eyes.   Interventions: 12 lead EKG completed: NSR CBG checked: 116 On-call for attending MD notified by primary RN   Per MD request, patient transferred to telemetry for further monitoring. Upon my exit, patient was alert and oriented, talking to staff and family members at bedside. HR 78, BP 155/66 (91), 98% on 2LNC, RR 20.   Plan of Care (if not transferred):  Event Summary:  Casimer Bilis

## 2018-06-26 ENCOUNTER — Inpatient Hospital Stay (HOSPITAL_COMMUNITY): Payer: Medicare Other

## 2018-06-26 LAB — CBC
HEMATOCRIT: 35.3 % — AB (ref 36.0–46.0)
Hemoglobin: 11.4 g/dL — ABNORMAL LOW (ref 12.0–15.0)
MCH: 31.1 pg (ref 26.0–34.0)
MCHC: 32.3 g/dL (ref 30.0–36.0)
MCV: 96.2 fL (ref 78.0–100.0)
Platelets: 142 10*3/uL — ABNORMAL LOW (ref 150–400)
RBC: 3.67 MIL/uL — ABNORMAL LOW (ref 3.87–5.11)
RDW: 14 % (ref 11.5–15.5)
WBC: 6.4 10*3/uL (ref 4.0–10.5)

## 2018-06-26 MED ORDER — ACETAMINOPHEN 500 MG PO TABS
500.0000 mg | ORAL_TABLET | Freq: Four times a day (QID) | ORAL | Status: DC | PRN
  Administered 2018-06-26: 500 mg via ORAL
  Filled 2018-06-26: qty 1

## 2018-06-26 NOTE — Progress Notes (Signed)
Occupational Therapy Treatment Patient Details Name: [name] MRN: [id number] DOB: 07-10-1941 Today's Date: 06/26/2018    History of present illness Pt is 77 YO female s/p R TKR on 06/23/18. PMH includes HTN, OA, RA, fibromyalgia, glaucoma, OP, sleep apnea, depression, hearing loss, bradycardia. Past surgical history includes cardiac cath, gastric resection, L TKR 2018.   OT comments  Pt lethargic today, likely medications.  Followed all cues but delayed.  Min physical assist needed to stand   Follow Up Recommendations  Follow surgeon's recommendation for DC plan and follow-up therapies    Equipment Recommendations  None recommended by OT    Recommendations for Other Services      Precautions / Restrictions Precautions Precautions: Fall;Knee Precaution Comments: requested new KI; lost in transition of rooms; brought by Ortho tech Required Braces or Orthoses: Knee Immobilizer - Right Knee Immobilizer - Right: On when out of bed or walking;Discontinue once straight leg raise with < 10 degree lag Restrictions Weight Bearing Restrictions: No Other Position/Activity Restrictions: WBAT        Mobility Bed Mobility          supervision, HOB raised with gait belt        Transfers   Equipment used: Rolling walker (2 wheeled)   Sit to Stand: Min assist         General transfer comment: 2 attempts.  Min A to power up and transition to standing    Balance                                           ADL either performed or assessed with clinical judgement   ADL                           Toilet Transfer: Minimal assistance;Stand-pivot;RW(chair)             General ADL Comments: pt donned KI with min A.  Transferred to chair:  2 attempts for sit to stand and min A needed     Vision       Perception     Praxis      Cognition Arousal/Alertness: Lethargic Behavior During Therapy: WFL for tasks assessed/performed                                   General Comments: follows all commands but drowsy and delayed        Exercises     Shoulder Instructions       General Comments      Pertinent Vitals/ Pain       Pain Score: 3  Pain Location: R knee Pain Descriptors / Indicators: Aching Pain Intervention(s): Limited activity within patient's tolerance;Monitored during session;Premedicated before session;Repositioned  Home Living                                          Prior Functioning/Environment              Frequency  Min 2X/week        Progress Toward Goals  OT Goals(current goals can now be found in the care plan section)  Progress towards OT goals: (did less well today; sleepy)  Plan      Co-evaluation                 AM-PAC PT "6 Clicks" Daily Activity     Outcome Measure   Help from another person eating meals?: None Help from another person taking care of personal grooming?: A Little Help from another person toileting, which includes using toliet, bedpan, or urinal?: A Little Help from another person bathing (including washing, rinsing, drying)?: A Little Help from another person to put on and taking off regular upper body clothing?: A Little Help from another person to put on and taking off regular lower body clothing?: A Lot 6 Click Score: 18    End of Session    OT Visit Diagnosis: Pain Pain - Right/Left: Right Pain - part of body: Knee   Activity Tolerance Patient limited by fatigue   Patient Left in chair;with call bell/phone within reach;with chair alarm set   Nurse Communication          Time: [phone] OT Time Calculation (min): 28 min  Charges: OT General Charges $OT Visit: 1 Visit OT Treatments $Self Care/Home Management : 8-22 mins $Therapeutic Activity: 8-22 mins  Lesle Chris, OTR/L Acute Rehabilitation Services [phone] [facility] pager [phone] office 06/26/2018   Saliha Salts 06/26/2018,  10:02 AM

## 2018-06-26 NOTE — Progress Notes (Signed)
Physical Therapy Treatment Patient Details Name: [name] MRN: [id number] DOB: [birth year] Today's Date: 06/26/2018    History of Present Illness Pt is 77 YO female s/p R TKR on 06/23/18. PMH includes HTN, OA, RA, fibromyalgia, glaucoma, OP, sleep apnea, depression, hearing loss, bradycardia. Past surgical history includes cardiac cath, gastric resection, L TKR 2018.    PT Comments    Pt up in recliner on arrival.  Pt still a little groggy and kept eyes closed (likely due to pain meds given this morning) during exercises however does follow commands and participate.  Will defer mobility to this afternoon once pt more alert/awake.    Follow Up Recommendations  Follow surgeon's recommendation for DC plan and follow-up therapies;Supervision for mobility/OOB(plan for [facility])     Equipment Recommendations  None recommended by PT    Recommendations for Other Services       Precautions / Restrictions Precautions Precautions: Fall;Knee Required Braces or Orthoses: Knee Immobilizer - Right Knee Immobilizer - Right: On when out of bed or walking;Discontinue once straight leg raise with < 10 degree lag Restrictions Weight Bearing Restrictions: No RLE Weight Bearing: Weight bearing as tolerated    Mobility  Bed Mobility                  Transfers                    Ambulation/Gait                 Stairs             Wheelchair Mobility    Modified Rankin (Stroke Patients Only)       Balance                                            Cognition Arousal/Alertness: Lethargic Behavior During Therapy: WFL for tasks assessed/performed Overall Cognitive Status: Within Functional Limits for tasks assessed                                 General Comments: follows all commands but drowsy and delayed      Exercises Total Joint Exercises Ankle Circles/Pumps: AROM;Both;10 reps Quad Sets: AROM;10 reps;Right Short Arc  Quad: AAROM;10 reps;Right Heel Slides: AAROM;10 reps;Right Hip ABduction/ADduction: AAROM;10 reps;Right Straight Leg Raises: AAROM;10 reps;Right Goniometric ROM: AAROM knee flexion approx 45*    General Comments        Pertinent Vitals/Pain Pain Assessment: 0-10 Pain Score: 4  Pain Location: R knee Pain Descriptors / Indicators: Aching;Sore Pain Intervention(s): Limited activity within patient's tolerance;Repositioned    Home Living                      Prior Function            PT Goals (current goals can now be found in the care plan section) Progress towards PT goals: Not progressing toward goals - comment    Frequency    7X/week      PT Plan Current plan remains appropriate    Co-evaluation              AM-PAC PT "6 Clicks" Daily Activity  Outcome Measure  Difficulty turning over in bed (including adjusting bedclothes, sheets and blankets)?: A Lot Difficulty moving from lying on back  to sitting on the side of the bed? : A Lot Difficulty sitting down on and standing up from a chair with arms (e.g., wheelchair, bedside commode, etc,.)?: A Lot Help needed moving to and from a bed to chair (including a wheelchair)?: A Lot Help needed walking in hospital room?: A Lot Help needed climbing 3-5 steps with a railing? : A Lot 6 Click Score: 12    End of Session   Activity Tolerance: Other (comment)(limited by lethary/grogginess) Patient left: with call bell/phone within reach;in chair;with chair alarm set Nurse Communication: Mobility status PT Visit Diagnosis: Other abnormalities of gait and mobility (R26.89);Difficulty in walking, not elsewhere classified (R26.2)     Time: 0352-4818 PT Time Calculation (min) (ACUTE ONLY): 11 min  Charges:  $Therapeutic Exercise: 8-22 mins                     Carmelia Bake, PT, DPT Acute Rehabilitation Services Office: [phone] Pager: [phone]  Trena Platt 06/26/2018, 1:38 PM

## 2018-06-26 NOTE — Care Management Important Message (Signed)
Important Message  Patient Details  Name: ULAH OLMO MRN: [id number] Date of Birth: 09/19/1941   Medicare Important Message Given:  Yes    Kerin Salen 06/26/2018, 12:19 [place] Message  Patient Details  Name: KEYLA MILONE MRN: [id number] Date of Birth: 23-Apr-1941   Medicare Important Message Given:  Yes    Kerin Salen 06/26/2018, 12:19 PM

## 2018-06-26 NOTE — Progress Notes (Signed)
Pt arrived to unit. Daughter and husband at bedside. Pt oriented to room, telemetry placed on pt, and VS obtained. Pt alert and oriented, no complaints at this time. Agree with previous RN's assessment. Will continue to monitor closely.

## 2018-06-26 NOTE — Progress Notes (Addendum)
Subjective: 3 Days Post-Op Procedure(s) (LRB): RIGHT TOTAL KNEE ARTHROPLASTY (Right) Patient reports pain as moderate.   Patient seen in rounds with Dr. Wynelle Link. Pt was transferred to telemetry room overnight. Per the night nurse, pt became very lethargic after sitting on the commode attempting to have bowel movement and struggled to answer questions with more than 1-2 words. EKG was obtained which showed NSR and CBG was 116. Pt came to and was alert and oriented. She is feeling much better this AM, although still notes fatigue. Voiding without difficulty. Denies abdominal pain, chest pain or SOB.   Objective: Vital signs in last 24 hours: Temp:  [98.6 F (37 C)-100.1 F (37.8 C)] 99.7 F (37.6 C) (09/12 0603) Pulse Rate:  [62-202] 70 (09/12 0603) Resp:  [16-25] 16 (09/12 0603) BP: (119-164)/(60-116) 142/60 (09/12 0603) SpO2:  [88 %-99 %] 88 % (09/12 0603)  Intake/Output from previous day:  Intake/Output Summary (Last 24 hours) at 06/26/2018 0802 Last data filed at 06/26/2018 0200 Gross per 24 hour  Intake 786.48 ml  Output -  Net 786.48 ml    Labs: Recent Labs    06/24/18 0436 06/25/18 0539 06/26/18 0347  HGB 11.8* 11.1* 11.4*   Recent Labs    06/25/18 0539 06/26/18 0347  WBC 8.4 6.4  RBC 3.60* 3.67*  HCT 34.8* 35.3*  PLT 144* 142*   Recent Labs    06/24/18 0436 06/25/18 0539  NA 139 140  K 4.9 4.8  CL 105 104  CO2 27 29  BUN 19 16  CREATININE 0.75 0.85  GLUCOSE 157* 128*  CALCIUM 8.7* 9.1   Exam: General - Patient is Alert and Oriented Extremity - Neurologically intact Neurovascular intact Sensation intact distally Dorsiflexion/Plantar flexion intact Dressing/Incision - clean, dry, no drainage Motor Function - intact, moving foot and toes well on exam.   Past Medical History:  Diagnosis Date  . Anal or rectal pain    sometimes  . Anemia    hx of during pregnancy  . Arthritis   . Asthma    hx of  . Bradycardia    " I KNOW I HAVE  BRADYCARDIA ESPECIALLY WHEN I SLEEP"   . Chronic back pain   . Degenerative joint disease    osteo  . Depression   . Diverticulosis 2003  . Dysrhythmia    hx of  due to eye drop and also low heart rate 40's per pt. Dr. Gwenlyn Found follows  . Elevated total protein   . Esophageal dysmotility   . Fibromyalgia   . GERD (gastroesophageal reflux disease)    subsequent Nissen Fundoplication  . Glaucoma   . Hearing loss   . Hemorrhoids   . Hiatal hernia 11/08/09  . Hx of adenomatous colonic polyps 07/02/02  . Hypercalcemia   . Hyperlipidemia   . Hyperlipidemia   . Hypertension   . Nausea   . Osteoporosis   . PONV (postoperative nausea and vomiting)   . Rectal bleeding    from hemorrhoids.    . Sleep apnea    DOES USE CPAP   . 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 Varicose veins of left lower extremity     Assessment/Plan: 3 Days Post-Op Procedure(s) (LRB): RIGHT TOTAL KNEE ARTHROPLASTY (Right) Active Problems:   OA (osteoarthritis) of knee  Estimated body mass index is 28.41 kg/m as calculated from the following:   Height as of this encounter: 5\' 10"  (1.778 m).   Weight as of this encounter: 89.8 kg. Up with therapy  DVT Prophylaxis - Xarelto Weight-bearing as tolerated  O2 noted to be low at 88% this AM. Portable CXR ordered.  Patient's episode last night was most likely vasovagal, she was sitting on the commode for an extended period of time attempting to have a bowel movement prior to event. She is improved this AM during rounds. EKG, CBG and hemoglobin were all within normal limits. If she has another episode or event, we will need to get consult from medicine.   ADDENDUM: CXR showed no infiltrates or signs of active disease. Will continue to monitor.  Possible discharge tomorrow if patient continues to be medically stable today and does well with therapy.  Theresa Duty, PA-C Orthopedic Surgery 06/26/2018, 8:02 AM

## 2018-06-26 NOTE — Progress Notes (Signed)
Physical Therapy Treatment Patient Details Name: [name] MRN: [id number] DOB: 1941-01-16 Today's Date: 06/26/2018    History of Present Illness Pt is 77 YO female s/p R TKR on 06/23/18. PMH includes HTN, OA, RA, fibromyalgia, glaucoma, OP, sleep apnea, depression, hearing loss, bradycardia. Past surgical history includes cardiac cath, gastric resection, L TKR 2018.    PT Comments    Pt progressing very slowly and concerned about her ability to care for herself upon d/c home.  Pt does have spouse however she reports he has early dementia and will likely attempt to help however do more harm in the process.  Pt requiring at least min assist at this time for mobility and reports increased pain with movement.  Pt felt unable to practice steps this afternoon due to pain as well.  Would recommend d/c to SNF for rehab prior to return home however plan is for home with [facility] at this time.   Follow Up Recommendations  Supervision for mobility/OOB;Follow surgeon's recommendation for DC plan and follow-up therapies(would benefit from SNF)     Equipment Recommendations  None recommended by PT    Recommendations for Other Services       Precautions / Restrictions Precautions Precautions: Fall;Knee Required Braces or Orthoses: Knee Immobilizer - Right Knee Immobilizer - Right: On when out of bed or walking;Discontinue once straight leg raise with < 10 degree lag Restrictions Weight Bearing Restrictions: No RLE Weight Bearing: Weight bearing as tolerated Other Position/Activity Restrictions: WBAT     Mobility  Bed Mobility Overal bed mobility: Needs Assistance Bed Mobility: Sit to Supine;Supine to Sit     Supine to sit: Min assist Sit to supine: Min assist   General bed mobility comments: assist R LE, increased time and effort  Transfers Overall transfer level: Needs assistance Equipment used: Rolling walker (2 wheeled) Transfers: Sit to/from Stand Sit to Stand: Min assist          General transfer comment: pt required assist to rise, steady and control descent; verbal cues for technique  Ambulation/Gait Ambulation/Gait assistance: Min assist Gait Distance (Feet): 70 Feet Assistive device: Rolling walker (2 wheeled) Gait Pattern/deviations: Step-to pattern;Decreased stance time - right;Antalgic;Trunk flexed Gait velocity: decreased   General Gait Details: pt reports increased pain however ambulated to tolerance, verbal cues for sequence, posture, RW positioning, step length   Stairs             Wheelchair Mobility    Modified Rankin (Stroke Patients Only)       Balance                                            Cognition Arousal/Alertness: Awake/alert Behavior During Therapy: WFL for tasks assessed/performed Overall Cognitive Status: Within Functional Limits for tasks assessed                                       Exercises     General Comments        Pertinent Vitals/Pain Pain Assessment: 0-10 Pain Score: 4  Pain Location: R knee Pain Descriptors / Indicators: Aching;Sore Pain Intervention(s): Monitored during session;Limited activity within patient's tolerance;Repositioned;Ice applied    Home Living  Prior Function            PT Goals (current goals can now be found in the care plan section) Progress towards PT goals: Progressing toward goals    Frequency    7X/week      PT Plan Discharge plan needs to be updated    Co-evaluation              AM-PAC PT "6 Clicks" Daily Activity  Outcome Measure  Difficulty turning over in bed (including adjusting bedclothes, sheets and blankets)?: Unable Difficulty moving from lying on back to sitting on the side of the bed? : Unable Difficulty sitting down on and standing up from a chair with arms (e.g., wheelchair, bedside commode, etc,.)?: Unable Help needed moving to and from a bed to chair (including a  wheelchair)?: A Little Help needed walking in hospital room?: A Lot Help needed climbing 3-5 steps with a railing? : Total 6 Click Score: 9    End of Session Equipment Utilized During Treatment: Gait belt;Right knee immobilizer Activity Tolerance: Patient limited by fatigue;Patient limited by pain Patient left: with call bell/phone within reach;in bed Nurse Communication: Mobility status PT Visit Diagnosis: Other abnormalities of gait and mobility (R26.89);Difficulty in walking, not elsewhere classified (R26.2)     Time: 8921-1941 PT Time Calculation (min) (ACUTE ONLY): 28 min  Charges:  $Gait Training: 23-37 mins                    Carmelia Bake, PT, DPT Acute Rehabilitation Services Office: [phone] Pager: [phone]    Trena Platt 06/26/2018, 4:25 PM

## 2018-06-27 LAB — URINALYSIS, ROUTINE W REFLEX MICROSCOPIC
BACTERIA UA: NONE SEEN
BILIRUBIN URINE: NEGATIVE
Glucose, UA: NEGATIVE mg/dL
Ketones, ur: NEGATIVE mg/dL
Leukocytes, UA: NEGATIVE
Nitrite: NEGATIVE
PROTEIN: NEGATIVE mg/dL
Specific Gravity, Urine: 1.008 (ref 1.005–1.030)
pH: 9 — ABNORMAL HIGH (ref 5.0–8.0)

## 2018-06-27 MED ORDER — TRAMADOL HCL 50 MG PO TABS: 50.0000 mg | ORAL_TABLET | Freq: Four times a day (QID) | ORAL | 0 refills | Status: DC | PRN

## 2018-06-27 MED ORDER — RIVAROXABAN 10 MG PO TABS: 10.0000 mg | ORAL_TABLET | Freq: Every day | ORAL | 0 refills | Status: DC

## 2018-06-27 NOTE — Progress Notes (Signed)
Patients daughter and POA, Jewel called with concerns related to mother's care, and questions regarding patient discharge.   RN communicated to daughter that the discharge was held for today, and pending for tomorrow to give patient an extra day with physical therapy.   Patient has a poor appetite, and has not had a bowel movement since Saturday 9/7. RN educated patient about the importance of having regular bowel movements. RN informed daughter that laxative was given this morning, and that patient needs to ask for an enema (that is ordered prn) tomorrow morning if she does not have a BM by then.   Daughter also concerned about patients "episode" this afternoon. RN affirmed to daughter that the patient got nauseated while walking in the hallway, and that nausea medication was given, and that a prescription for nausea medication will be sent with patient when she is discharged tomorrow.   Patient and daughter educated that the most likely reason that she is nauseated at this time is because she needs to have a bowel movement.   Daughter stated that all questions and concerns were answered, and that she would wait for her mother's discharge tomorrow.

## 2018-06-27 NOTE — Progress Notes (Signed)
Physical Therapy Treatment Patient Details Name: [name] MRN: [id number] DOB: 06-19-1941 Today's Date: 06/27/2018    History of Present Illness Pt is 77 YO female s/p R TKR on 06/23/18. PMH includes HTN, OA, RA, fibromyalgia, glaucoma, OP, sleep apnea, depression, hearing loss, bradycardia. Past surgical history includes cardiac cath, gastric resection, L TKR 2018.    PT Comments    Pt assisted with ambulating in hallway and able to slightly progress distance.  Pt also assisted with using BSC.  Pt requires increased time for all mobility.  Plan is for pt to d/c home today so will return to practice steps.   Follow Up Recommendations  Supervision for mobility/OOB;Follow surgeon's recommendation for DC plan and follow-up therapies(would benefit from SNF)     Equipment Recommendations  None recommended by PT    Recommendations for Other Services       Precautions / Restrictions Precautions Precautions: Fall;Knee Required Braces or Orthoses: Knee Immobilizer - Right Knee Immobilizer - Right: On when out of bed or walking;Discontinue once straight leg raise with < 10 degree lag Restrictions RLE Weight Bearing: Weight bearing as tolerated    Mobility  Bed Mobility               General bed mobility comments: pt up in recliner  Transfers Overall transfer level: Needs assistance Equipment used: Rolling walker (2 wheeled) Transfers: Sit to/from Stand Sit to Stand: Min guard         General transfer comment: extra time with transition of sit to stand.  Min guard for safety  Ambulation/Gait Ambulation/Gait assistance: Min guard Gait Distance (Feet): 80 Feet Assistive device: Rolling walker (2 wheeled) Gait Pattern/deviations: Step-to pattern;Decreased stance time - right;Antalgic;Trunk flexed Gait velocity: decreased   General Gait Details: ambulated to tolerance, verbal cues for sequence, posture, RW positioning, step length   Stairs              Wheelchair Mobility    Modified Rankin (Stroke Patients Only)       Balance                                            Cognition Arousal/Alertness: Awake/alert Behavior During Therapy: WFL for tasks assessed/performed Overall Cognitive Status: Within Functional Limits for tasks assessed                                        Exercises      General Comments        Pertinent Vitals/Pain Pain Assessment: 0-10 Pain Score: 6  Pain Location: R knee Pain Descriptors / Indicators: Aching;Sore Pain Intervention(s): Limited activity within patient's tolerance;Repositioned;Monitored during session;Ice applied;Premedicated before session    Home Living                      Prior Function            PT Goals (current goals can now be found in the care plan section) Progress towards PT goals: Progressing toward goals    Frequency    7X/week      PT Plan Current plan remains appropriate    Co-evaluation              AM-PAC PT "6 Clicks" Daily Activity  Outcome Measure  Difficulty  turning over in bed (including adjusting bedclothes, sheets and blankets)?: Unable Difficulty moving from lying on back to sitting on the side of the bed? : Unable Difficulty sitting down on and standing up from a chair with arms (e.g., wheelchair, bedside commode, etc,.)?: A Lot Help needed moving to and from a bed to chair (including a wheelchair)?: A Little Help needed walking in hospital room?: A Little Help needed climbing 3-5 steps with a railing? : A Lot 6 Click Score: 12    End of Session Equipment Utilized During Treatment: Gait belt;Right knee immobilizer Activity Tolerance: Patient limited by fatigue;Patient limited by pain Patient left: in chair;with call bell/phone within reach Nurse Communication: Mobility status PT Visit Diagnosis: Other abnormalities of gait and mobility (R26.89);Difficulty in walking, not elsewhere  classified (R26.2)     Time: 1015-1040 PT Time Calculation (min) (ACUTE ONLY): 25 min  Charges:  $Gait Training: 23-37 mins                     Carmelia Bake, PT, DPT Acute Rehabilitation Services Office: [phone] Pager: [phone]  Trena Platt 06/27/2018, 12:42 PM

## 2018-06-27 NOTE — Progress Notes (Signed)
Occupational Therapy Treatment Patient Details Name: [name] MRN: [id number] DOB: 17-Aug-1941 Today's Date: 06/27/2018    History of present illness Pt is 77 YO female s/p R TKR on 06/23/18. PMH includes HTN, OA, RA, fibromyalgia, glaucoma, OP, sleep apnea, depression, hearing loss, bradycardia. Past surgical history includes cardiac cath, gastric resection, L TKR 2018.   OT comments  Pt improved from yesterday. Still slow transitions from sit to stand but no physical assist needed:  Min guard for safety. Worked on [employer] dressing and applying KI from edge of chair.  Pt recalls previous education about setting herself up for adl in bathroom, standing at sink for peri area and sitting on commode for rest of bathing and dressing.  Follow Up Recommendations  Follow surgeon's recommendation for DC plan and follow-up therapies    Equipment Recommendations  None recommended by OT    Recommendations for Other Services      Precautions / Restrictions Precautions Precautions: Fall;Knee Required Braces or Orthoses: Knee Immobilizer - Right Knee Immobilizer - Right: On when out of bed or walking;Discontinue once straight leg raise with < 10 degree lag Restrictions RLE Weight Bearing: Weight bearing as tolerated       Mobility Bed Mobility               General bed mobility comments: oob  Transfers   Equipment used: Rolling walker (2 wheeled)   Sit to Stand: Min guard         General transfer comment: extra time with transition of sit to stand; no cueing needed.  Min guard for safety    Balance                                           ADL either performed or assessed with clinical judgement   ADL                       Lower Body Dressing: Min guard;Sit to/from stand(depends and pants); daughter will assist with socks/teds at home (overall mod A)                 General ADL Comments: Pt had already bathed. She verbalized previous  education about setting herself up for adl, standing at sink for peri area and bringing 2 cloths to commode to complete the rest sitting. She was still fatiqued but didn't need any physical assitance to don pants/depends.  Educated on sequence and bunching pants up to don more easily. Pt used one arm to clear RLE from floor.  Stood without KI as she has walked without it before.  Worked on donning KI from edge of chair, if she feels she wants it on when standing to pull pants up.  Extra time for transition for sit to stand     Vision       Perception     Praxis      Cognition Arousal/Alertness: Awake/alert Behavior During Therapy: [facility] for tasks assessed/performed Overall Cognitive Status: Within Functional Limits for tasks assessed                                          Exercises     Shoulder Instructions       General Comments  Pertinent Vitals/ Pain       Pain Score: 8  Pain Location: R knee Pain Descriptors / Indicators: Aching;Sore Pain Intervention(s): Limited activity within patient's tolerance;Monitored during session;Premedicated before session;Repositioned;Ice applied  Home Living                                          Prior Functioning/Environment              Frequency           Progress Toward Goals  OT Goals(current goals can now be found in the care plan section)  Progress towards OT goals: Progressing toward goals     Plan      Co-evaluation                 AM-PAC PT "6 Clicks" Daily Activity     Outcome Measure   Help from another person eating meals?: None Help from another person taking care of personal grooming?: A Little Help from another person toileting, which includes using toliet, bedpan, or urinal?: A Little Help from another person bathing (including washing, rinsing, drying)?: A Little Help from another person to put on and taking off regular upper body clothing?: A  Little Help from another person to put on and taking off regular lower body clothing?: A Lot 6 Click Score: 18    End of Session CPM Right Knee CPM Right Knee: Off  OT Visit Diagnosis: Pain Pain - Right/Left: Right Pain - part of body: Knee   Activity Tolerance Patient tolerated treatment well   Patient Left in chair;with call bell/phone within reach;with chair alarm set   Nurse Communication          Time: 3358-2518 OT Time Calculation (min): 37 min  Charges: OT General Charges $OT Visit: 1 Visit OT Treatments $Self Care/Home Management : 23-37 mins  Lesle Chris, OTR/L Acute Rehabilitation Services [phone] [facility] pager [phone] office 06/27/2018   Pia Jedlicka 06/27/2018, 10:08 AM

## 2018-06-27 NOTE — Progress Notes (Addendum)
Subjective: 4 Days Post-Op Procedure(s) (LRB): RIGHT TOTAL KNEE ARTHROPLASTY (Right) Patient reports pain as mild to moderate..   Patient seen in rounds by Dr. Wynelle Link. Patient is well, but complains of incontinence and burning with urination that has began since yesterday. No issues overnight. Denies chest pain, SOB or calf pain. Plan is to go Home after hospital stay.  Objective: Vital signs in last 24 hours: Temp:  [99.5 F (37.5 C)-100.9 F (38.3 C)] 99.6 F (37.6 C) (09/13 0448) Pulse Rate:  [71-80] 80 (09/13 0448) Resp:  [16-20] 20 (09/13 0448) BP: (134-157)/(59-67) 142/59 (09/13 0448) SpO2:  [94 %-99 %] 94 % (09/13 0448)  Intake/Output from previous day:  Intake/Output Summary (Last 24 hours) at 06/27/2018 0715 Last data filed at 06/27/2018 0450 Gross per 24 hour  Intake 480 ml  Output 1200 ml  Net -720 ml    Labs: Recent Labs    06/25/18 0539 06/26/18 0347  HGB 11.1* 11.4*   Recent Labs    06/25/18 0539 06/26/18 0347  WBC 8.4 6.4  RBC 3.60* 3.67*  HCT 34.8* 35.3*  PLT 144* 142*   Recent Labs    06/25/18 0539  NA 140  K 4.8  CL 104  CO2 29  BUN 16  CREATININE 0.85  GLUCOSE 128*  CALCIUM 9.1   Exam: General - Patient is Alert and Oriented Extremity - Neurologically intact Neurovascular intact Sensation intact distally Dorsiflexion/Plantar flexion intact Dressing/Incision - clean, dry, no drainage Motor Function - intact, moving foot and toes well on exam.   Past Medical History:  Diagnosis Date  . Anal or rectal pain    sometimes  . Anemia    hx of during pregnancy  . Arthritis   . Asthma    hx of  . Bradycardia    " I KNOW I HAVE BRADYCARDIA ESPECIALLY WHEN I SLEEP"   . Chronic back pain   . Degenerative joint disease    osteo  . Depression   . Diverticulosis 2003  . Dysrhythmia    hx of  due to eye drop and also low heart rate 40's per pt. Dr. Gwenlyn Found follows  . Elevated total protein   . Esophageal dysmotility   .  Fibromyalgia   . GERD (gastroesophageal reflux disease)    subsequent Nissen Fundoplication  . Glaucoma   . Hearing loss   . Hemorrhoids   . Hiatal hernia 11/08/09  . Hx of adenomatous colonic polyps 07/02/02  . Hypercalcemia   . Hyperlipidemia   . Hyperlipidemia   . Hypertension   . Nausea   . Osteoporosis   . PONV (postoperative nausea and vomiting)   . Rectal bleeding    from hemorrhoids.    . Sleep apnea    DOES USE CPAP   . Thrombocytopenia ([place])   . Varicose veins of left lower extremity     Assessment/Plan: 4 Days Post-Op Procedure(s) (LRB): RIGHT TOTAL KNEE ARTHROPLASTY (Right) Active Problems:   OA (osteoarthritis) of knee  Estimated body mass index is 28.41 kg/m as calculated from the following:   Height as of this encounter: 5\' 10"  (1.778 m).   Weight as of this encounter: 89.8 kg. Up with therapy D/C IV fluids  DVT Prophylaxis - Xarelto Weight-bearing as tolerated  Acute event two nights ago was most likely a vasovagal episode, pt has had no repeat events. Will order UA for urinary complaints and treat appropriately.   ADDENDUM: UA clear with no signs of infection, no treatment needed.  Plan for discharge today after two sessions of physical therapy as long as medically stable. Scheduled for virtual physical therapy. Follow-up in the office in 2 weeks with Dr. Wynelle Link.   Theresa Duty, PA-C Orthopedic Surgery 06/27/2018, 7:15 AM

## 2018-06-27 NOTE — Progress Notes (Signed)
Physical Therapy Treatment Patient Details Name: [name] MRN: [id number] DOB: August 17, 1941 Today's Date: 06/27/2018    History of Present Illness Pt is 77 YO female s/p R TKR on 06/23/18. PMH includes HTN, OA, RA, fibromyalgia, glaucoma, OP, sleep apnea, depression, hearing loss, bradycardia. Past surgical history includes cardiac cath, gastric resection, L TKR 2018.    PT Comments    Pt donned KI and required increased time and cues to apply. Pt ambulated to steps and practiced steps however upon descending pt reports nausea and slight dizziness and required more assist to safely descend steps and sit down in chair.  Pt does not appear ready for d/c home today as she was unable to perform steps safely.  Spouse assisted with holding RW however promptly left room for other obligations.   Continue to feel pt would benefit from SNF prior to home as she will not have very much help from family and presents as high fall risk.     Follow Up Recommendations  Supervision for mobility/OOB;Follow surgeon's recommendation for DC plan and follow-up therapies(would benefit from SNF)     Equipment Recommendations  None recommended by PT    Recommendations for Other Services       Precautions / Restrictions Precautions Precautions: Fall;Knee Required Braces or Orthoses: Knee Immobilizer - Right Knee Immobilizer - Right: On when out of bed or walking;Discontinue once straight leg raise with < 10 degree lag Restrictions RLE Weight Bearing: Weight bearing as tolerated Other Position/Activity Restrictions: WBAT     Mobility  Bed Mobility Overal bed mobility: Needs Assistance Bed Mobility: Sit to Supine       Sit to supine: Min assist   General bed mobility comments: assist for LE onto bed  Transfers Overall transfer level: Needs assistance Equipment used: Rolling walker (2 wheeled) Transfers: Sit to/from Stand Sit to Stand: Min assist         General transfer comment: extra time  with transition of sit to stand.  Min guard for safety however min assist from chair back to bed after nausea  Ambulation/Gait Ambulation/Gait assistance: Min guard Gait Distance (Feet): 20 Feet Assistive device: Rolling walker (2 wheeled) Gait Pattern/deviations: Step-to pattern;Decreased stance time - right;Antalgic;Trunk flexed Gait velocity: decreased   General Gait Details: verbal cues for sequence, posture, RW positioning, step length   Stairs Stairs: Yes Stairs assistance: Min assist Stair Management: Step to pattern;Backwards;With walker Number of Stairs: 2 General stair comments: verbal cues for sequence and RW positioning, spouse present and holding RW, pt with increased nausea and required min assist for descent; moving very slowly and assist required so bystander brought chair to steps and pt assisted to chair; RN called and obtained vitals (WNL except pt with temp) pt taken back to room in chair and assisted back to bed   Wheelchair Mobility    Modified Rankin (Stroke Patients Only)       Balance                                            Cognition Arousal/Alertness: Awake/alert Behavior During Therapy: WFL for tasks assessed/performed Overall Cognitive Status: Within Functional Limits for tasks assessed                                 General Comments: follows commands however drowsy  and requires increased time      Exercises      General Comments        Pertinent Vitals/Pain Pain Assessment: 0-10 Pain Score: 7  Pain Location: R knee Pain Descriptors / Indicators: Aching;Sore Pain Intervention(s): Limited activity within patient's tolerance;Repositioned;Monitored during session;Ice applied    Home Living                      Prior Function            PT Goals (current goals can now be found in the care plan section) Progress towards PT goals: Progressing toward goals    Frequency     7X/week      PT Plan Current plan remains appropriate    Co-evaluation              AM-PAC PT "6 Clicks" Daily Activity  Outcome Measure  Difficulty turning over in bed (including adjusting bedclothes, sheets and blankets)?: Unable Difficulty moving from lying on back to sitting on the side of the bed? : Unable Difficulty sitting down on and standing up from a chair with arms (e.g., wheelchair, bedside commode, etc,.)?: Unable Help needed moving to and from a bed to chair (including a wheelchair)?: A Little Help needed walking in hospital room?: A Little Help needed climbing 3-5 steps with a railing? : A Lot 6 Click Score: 11    End of Session Equipment Utilized During Treatment: Gait belt;Right knee immobilizer Activity Tolerance: Patient limited by fatigue;Patient limited by pain;Other (comment)(nausea) Patient left: with call bell/phone within reach;in bed;with bed alarm set Nurse Communication: Mobility status PT Visit Diagnosis: Other abnormalities of gait and mobility (R26.89);Difficulty in walking, not elsewhere classified (R26.2)     Time: 5003-7048 PT Time Calculation (min) (ACUTE ONLY): 32 min  Charges:  $Gait Training: 8-22 mins $Therapeutic Activity: 8-22 mins                     [name], PT, DPT Acute Rehabilitation Services Office: [phone] Pager: [phone] Trena Platt 06/27/2018, 3:21 PM

## 2018-06-27 NOTE — Progress Notes (Signed)
Paged PA about family's concerns regarding CPM machine.  Husband concerned that patient has not completed her CPM time. RN communicated patient's missing her CPM time on Thursday.   PA affirmed that active physical therapy, and mobility of moving up and down to the bathroom is more important than doinf CPM time.   RN communicated this information to the patient and family, and answered the remainder of their questions and concerns.     Also called Orthopedic tech regarding CPM and why it was not transferred to room, or a new one found when it was missing from the room.

## 2018-06-28 MED ORDER — FLEET ENEMA 7-19 GM/118ML RE ENEM: 1.0000 | ENEMA | Freq: Once | RECTAL | Status: DC

## 2018-06-28 NOTE — Progress Notes (Signed)
Patient discharged to home with family, discharge instructions reviewed with patient and family who verbalized understanding. New RX given to patient.  

## 2018-06-28 NOTE — Progress Notes (Signed)
Physical Therapy Treatment Patient Details Name: [name] MRN: [id number] DOB: 1941-05-31 Today's Date: 06/28/2018    History of Present Illness Pt is 77 YO female s/p R TKR on 06/23/18. PMH includes HTN, OA, RA, fibromyalgia, glaucoma, OP, sleep apnea, depression, hearing loss, bradycardia. Past surgical history includes cardiac cath, gastric resection, L TKR 2018.    PT Comments    POD # 5 Pt did have a large formed BM and feeling better.  This session had spouse "hands on" assist pt with alll mobility listed below.  Pt has met goals to D/C to home today.    Follow Up Recommendations  Supervision for mobility/OOB;Follow surgeon's recommendation for DC plan and follow-up therapies     Equipment Recommendations  None recommended by PT    Recommendations for Other Services       Precautions / Restrictions Precautions Precautions: Fall;Knee Precaution Comments: instructed pt and spouse KI was no longer needed  Restrictions Weight Bearing Restrictions: No RLE Weight Bearing: Weight bearing as tolerated    Mobility  Bed Mobility Overal bed mobility: Needs Assistance Bed Mobility: Supine to Sit     Supine to sit: Min assist     General bed mobility comments: OOB in recliner  Transfers Overall transfer level: Needs assistance Equipment used: Rolling walker (2 wheeled) Transfers: Sit to/from Stand Sit to Stand: Supervision;Min guard         General transfer comment: 25% VC's on proper hand placment and safety with turns spouse "hands on" assisted  Ambulation/Gait Ambulation/Gait assistance: Min guard Gait Distance (Feet): 22 Feet Assistive device: Rolling walker (2 wheeled) Gait Pattern/deviations: Step-to pattern;Decreased stance time - right;Antalgic;Trunk flexed Gait velocity: decreased   General Gait Details: spouse "hands on" assisted with 25% VC's on safe handling    Stairs Stairs: Yes   Stair Management: No rails;With walker;Forwards Number of  Stairs: 2 General stair comments: spouse "hands on" assisted forward using walker by placing walker 1/2 way step up and spouse securing securing walker.  Tolerated this tech well.     Wheelchair Mobility    Modified Rankin (Stroke Patients Only)       Balance                                            Cognition Arousal/Alertness: Awake/alert Behavior During Therapy: WFL for tasks assessed/performed Overall Cognitive Status: Within Functional Limits for tasks assessed                                 General Comments: pt required positive encouragement      Exercises      General Comments        Pertinent Vitals/Pain Pain Assessment: 0-10 Pain Score: 5  Pain Location: R knee Pain Descriptors / Indicators: Aching;Sore    Home Living                      Prior Function            PT Goals (current goals can now be found in the care plan section) Progress towards PT goals: Progressing toward goals    Frequency    7X/week      PT Plan Current plan remains appropriate    Co-evaluation  AM-PAC PT "6 Clicks" Daily Activity  Outcome Measure  Difficulty turning over in bed (including adjusting bedclothes, sheets and blankets)?: A Lot Difficulty moving from lying on back to sitting on the side of the bed? : A Lot Difficulty sitting down on and standing up from a chair with arms (e.g., wheelchair, bedside commode, etc,.)?: A Lot Help needed moving to and from a bed to chair (including a wheelchair)?: A Lot Help needed walking in hospital room?: A Lot Help needed climbing 3-5 steps with a railing? : A Lot 6 Click Score: 12    End of Session Equipment Utilized During Treatment: Gait belt Activity Tolerance: Patient tolerated treatment well Patient left: Other (comment)(in bathroom advised RN/NT pt instructed to pull cord for assistance) Nurse Communication: Mobility status PT Visit Diagnosis: Other  abnormalities of gait and mobility (R26.89);Difficulty in walking, not elsewhere classified (R26.2)     Time: 1020-1050 PT Time Calculation (min) (ACUTE ONLY): 30 min  Charges:  $Gait Training: 8-22 mins $Therapeutic Activity: 8-22 mins                     Rica Koyanagi  PTA Acute  Rehabilitation Services Pager      [phone] Office      [phone]

## 2018-06-28 NOTE — Progress Notes (Signed)
I accepted care for this pt at @2340  and agree with the previous assessment.

## 2018-06-28 NOTE — Progress Notes (Signed)
Physical Therapy Treatment Patient Details Name: [name] MRN: [id number] DOB: 1941/02/22 Today's Date: 06/28/2018    History of Present Illness Pt is 77 YO female s/p R TKR on 06/23/18. PMH includes HTN, OA, RA, fibromyalgia, glaucoma, OP, sleep apnea, depression, hearing loss, bradycardia. Past surgical history includes cardiac cath, gastric resection, L TKR 2018.    PT Comments    POD # 5 Assisted OOB to amb to bathroom.  Pt required increased time and MAX positive encouragement.  Also advise KI was no longer needed as pt's quad appears stronger.  Pt assisted with toilet transfer and spouse instructed on safe handling.  Daughter sat on bench and observed.  Pt instructed to pull call light when finished.  RN/NT advised.      Follow Up Recommendations  Supervision for mobility/OOB;Follow surgeon's recommendation for DC plan and follow-up therapies     Equipment Recommendations  None recommended by PT    Recommendations for Other Services       Precautions / Restrictions Precautions Precautions: Fall;Knee Precaution Comments: instructed pt and spouse KI was no longer needed  Restrictions Weight Bearing Restrictions: No RLE Weight Bearing: Weight bearing as tolerated    Mobility  Bed Mobility Overal bed mobility: Needs Assistance Bed Mobility: Supine to Sit     Supine to sit: Min assist     General bed mobility comments: assist for LE off bed and increased time  Transfers Overall transfer level: Needs assistance Equipment used: Rolling walker (2 wheeled) Transfers: Sit to/from Stand Sit to Stand: Supervision;Min guard         General transfer comment: 25% VC's on proper hand placment and safety with turns  Ambulation/Gait Ambulation/Gait assistance: Min guard Gait Distance (Feet): 18 Feet Assistive device: Rolling walker (2 wheeled) Gait Pattern/deviations: Step-to pattern;Decreased stance time - right;Antalgic;Trunk flexed Gait velocity: decreased    General Gait Details: amb to bathroom with increased time and 25% VC's on upright posture and proper walker to self distance   Stairs             Wheelchair Mobility    Modified Rankin (Stroke Patients Only)       Balance                                            Cognition Arousal/Alertness: Awake/alert Behavior During Therapy: WFL for tasks assessed/performed Overall Cognitive Status: Within Functional Limits for tasks assessed                                 General Comments: pt required positive encouragement      Exercises      General Comments        Pertinent Vitals/Pain Pain Assessment: 0-10 Pain Score: 5  Pain Location: R knee Pain Descriptors / Indicators: Aching;Sore    Home Living                      Prior Function            PT Goals (current goals can now be found in the care plan section) Progress towards PT goals: Progressing toward goals    Frequency    7X/week      PT Plan Current plan remains appropriate    Co-evaluation  AM-PAC PT "6 Clicks" Daily Activity  Outcome Measure  Difficulty turning over in bed (including adjusting bedclothes, sheets and blankets)?: A Lot Difficulty moving from lying on back to sitting on the side of the bed? : A Lot Difficulty sitting down on and standing up from a chair with arms (e.g., wheelchair, bedside commode, etc,.)?: A Lot Help needed moving to and from a bed to chair (including a wheelchair)?: A Lot Help needed walking in hospital room?: A Lot Help needed climbing 3-5 steps with a railing? : A Lot 6 Click Score: 12    End of Session Equipment Utilized During Treatment: Gait belt Activity Tolerance: Patient tolerated treatment well Patient left: Other (comment)(in bathroom advised RN/NT pt instructed to pull cord for assistance) Nurse Communication: Mobility status PT Visit Diagnosis: Other abnormalities of gait and  mobility (R26.89);Difficulty in walking, not elsewhere classified (R26.2)     Time: 9458-5929 PT Time Calculation (min) (ACUTE ONLY): 25 min  Charges:  $Gait Training: 8-22 mins $Therapeutic Activity: 8-22 mins                     Rica Koyanagi  PTA Acute  Rehabilitation Services Pager      [phone] Office      [phone]

## 2018-06-28 NOTE — Progress Notes (Signed)
PHYSICAL THERAPY  Assisted pt to bathroom, she did have a large formed BM (reported to RN). Pt amb at supervision level.  Also pt perfformed stair training with spouse.  Pt has met goals to D/C to home this morning  Rica Koyanagi  PTA Acute  Rehabilitation Services Pager      [phone] Office      [phone]

## 2018-06-28 NOTE — Progress Notes (Signed)
[name]  MRN: [id number] DOB/Age: [birth year] 77 y.o. Physician: [name], M.D. 5 Days Post-Op Procedure(s) (LRB): RIGHT TOTAL KNEE ARTHROPLASTY (Right)  Subjective: Feeling better this am but no BM. Fair appetite, still c/o limited mobility Vital Signs Temp:  [98.7 F (37.1 C)-101.8 F (38.8 C)] 98.7 F (37.1 C) (09/13 2148) Pulse Rate:  [77-78] 77 (09/13 2148) Resp:  [16] 16 (09/13 2148) BP: (129-159)/(64-78) 129/64 (09/13 2148) SpO2:  [90 %-98 %] 90 % (09/13 2148)  Lab Results Recent Labs    06/26/18 0347  WBC 6.4  HGB 11.4*  HCT 35.3*  PLT 142*   BMET No results for input(s): NA, K, CL, CO2, GLUCOSE, BUN, CREATININE, CALCIUM in the last 72 hours. INR  Date Value Ref Range Status  06/18/2018 1.00  Final    Comment:    Performed at [facility], [place] [address], [place], [place] [address]     Exam  R knee dressing dry, fair quad tone, N/V intact distally. Ice in place  Plan Enema today, d/c home if BM and good PT progesss. [name] 06/28/2018, 8:51 AM    Contact # [phone]

## 2018-06-30 NOTE — Discharge Summary (Signed)
Physician Discharge Summary   Patient ID: [name] MRN: [id number] DOB/AGE: 1941/01/28 77 y.o.  Admit date: 06/23/2018 Discharge date: 06/28/2018  Primary Diagnosis: Osteoarthritis, right knee   Admission Diagnoses:  Past Medical History:  Diagnosis Date  . Anal or rectal pain    sometimes  . Anemia    hx of during pregnancy  . Arthritis   . Asthma    hx of  . Bradycardia    " I KNOW I HAVE BRADYCARDIA ESPECIALLY WHEN I SLEEP"   . Chronic back pain   . Degenerative joint disease    osteo  . Depression   . Diverticulosis 2003  . Dysrhythmia    hx of  due to eye drop and also low heart rate 40's per pt. Dr. Gwenlyn Found follows  . Elevated total protein   . Esophageal dysmotility   . Fibromyalgia   . GERD (gastroesophageal reflux disease)    subsequent Nissen Fundoplication  . Glaucoma   . Hearing loss   . Hemorrhoids   . Hiatal hernia 11/08/09  . Hx of adenomatous colonic polyps 07/02/02  . Hypercalcemia   . Hyperlipidemia   . Hyperlipidemia   . Hypertension   . Nausea   . Osteoporosis   . PONV (postoperative nausea and vomiting)   . Rectal bleeding    from hemorrhoids.    . Sleep apnea    DOES USE CPAP   . Thrombocytopenia ([place])   . Varicose veins of left lower extremity    Discharge Diagnoses:   Active Problems:   OA (osteoarthritis) of knee  Estimated body mass index is 28.41 kg/m as calculated from the following:   Height as of this encounter: _0  (1.778 m).   Weight as of this encounter: 89.8 kg.  Procedure:  Procedure(s) (LRB): RIGHT TOTAL KNEE ARTHROPLASTY (Right)   Consults: None  HPI: [name] is a 77 y.o. year old female with end stage OA of her right knee with progressively worsening pain and dysfunction. She has constant pain, with activity and at rest and significant functional deficits with difficulties even with ADLs. She has had extensive non-op management including analgesics, injections of cortisone and viscosupplements, and home  exercise program, but remains in significant pain with significant dysfunction.Radiographs show bone on bone arthritis patellofemoral with medial and lateral narrowing. She presents now for right Total Knee Arthroplasty.   Laboratory Data: Admission on 06/23/2018, Discharged on 06/28/2018  Component Date Value Ref Range Status  . WBC 06/24/2018 6.5  4.0 - 10.5 K/uL Final  . RBC 06/24/2018 3.83* 3.87 - 5.11 MIL/uL Final  . Hemoglobin 06/24/2018 11.8* 12.0 - 15.0 g/dL Final  . HCT 06/24/2018 37.2  36.0 - 46.0 % Final  . MCV 06/24/2018 97.1  78.0 - 100.0 fL Final  . MCH 06/24/2018 30.8  26.0 - 34.0 pg Final  . MCHC 06/24/2018 31.7  30.0 - 36.0 g/dL Final  . RDW 06/24/2018 13.6  11.5 - 15.5 % Final  . Platelets 06/24/2018 151  150 - 400 K/uL Final   Performed at [facility], [place] [address] [place], [place] [address]  . Sodium 06/24/2018 139  135 - 145 mmol/L Final  . Potassium 06/24/2018 4.9  3.5 - 5.1 mmol/L Final  . Chloride 06/24/2018 105  98 - 111 mmol/L Final  . CO2 06/24/2018 27  22 - 32 mmol/L Final  . Glucose, Bld 06/24/2018 157* 70 - 99 mg/dL Final  . BUN 06/24/2018 19  8 - 23  mg/dL Final  . Creatinine, Ser 06/24/2018 0.75  0.44 - 1.00 mg/dL Final  . Calcium 06/24/2018 8.7* 8.9 - 10.3 mg/dL Final  . GFR calc non Af Amer 06/24/2018 >60  >60 mL/min Final  . GFR calc Af Amer 06/24/2018 >60  >60 mL/min Final   Comment: (NOTE) The eGFR has been calculated using the CKD EPI equation. This calculation has not been validated in all clinical situations. eGFR's persistently <60 mL/min signify possible Chronic Kidney Disease.   Georgiann Hahn gap 06/24/2018 7  5 - 15 Final   Performed at [facility], [place] [address] [place], [place] [address]  . WBC 06/25/2018 8.4  4.0 - 10.5 K/uL Final  . RBC 06/25/2018 3.60* 3.87 - 5.11 MIL/uL Final  . Hemoglobin 06/25/2018 11.1* 12.0 - 15.0 g/dL Final  . HCT 06/25/2018 34.8* 36.0 - 46.0 % Final  . MCV 06/25/2018  96.7  78.0 - 100.0 fL Final  . MCH 06/25/2018 30.8  26.0 - 34.0 pg Final  . MCHC 06/25/2018 31.9  30.0 - 36.0 g/dL Final  . RDW 06/25/2018 13.7  11.5 - 15.5 % Final  . Platelets 06/25/2018 144* 150 - 400 K/uL Final   Performed at [facility], [place] [address], [place], [place] [address]  . Sodium 06/25/2018 140  135 - 145 mmol/L Final  . Potassium 06/25/2018 4.8  3.5 - 5.1 mmol/L Final  . Chloride 06/25/2018 104  98 - 111 mmol/L Final  . CO2 06/25/2018 29  22 - 32 mmol/L Final  . Glucose, Bld 06/25/2018 128* 70 - 99 mg/dL Final  . BUN 06/25/2018 16  8 - 23 mg/dL Final  . Creatinine, Ser 06/25/2018 0.85  0.44 - 1.00 mg/dL Final  . Calcium 06/25/2018 9.1  8.9 - 10.3 mg/dL Final  . GFR calc non Af Amer 06/25/2018 >60  >60 mL/min Final  . GFR calc Af Amer 06/25/2018 >60  >60 mL/min Final   Comment: (NOTE) The eGFR has been calculated using the CKD EPI equation. This calculation has not been validated in all clinical situations. eGFR's persistently <60 mL/min signify possible Chronic Kidney Disease.   Georgiann Hahn gap 06/25/2018 7  5 - 15 Final   Performed at [facility], [place] [address] [place], [place] [address]  . WBC 06/26/2018 6.4  4.0 - 10.5 K/uL Final  . RBC 06/26/2018 3.67* 3.87 - 5.11 MIL/uL Final  . Hemoglobin 06/26/2018 11.4* 12.0 - 15.0 g/dL Final  . HCT 06/26/2018 35.3* 36.0 - 46.0 % Final  . MCV 06/26/2018 96.2  78.0 - 100.0 fL Final  . MCH 06/26/2018 31.1  26.0 - 34.0 pg Final  . MCHC 06/26/2018 32.3  30.0 - 36.0 g/dL Final  . RDW 06/26/2018 14.0  11.5 - 15.5 % Final  . Platelets 06/26/2018 142* 150 - 400 K/uL Final   Performed at [facility], [place] [address] [place], [place] [address]  . Glucose-Capillary 06/25/2018 116* 70 - 99 mg/dL Final  . Color, Urine 06/27/2018 STRAW* YELLOW Final  . APPearance 06/27/2018 CLEAR  CLEAR Final  . Specific Gravity, Urine 06/27/2018 1.008  1.005 - 1.030 Final  . pH 06/27/2018 9.0*  5.0 - 8.0 Final  . Glucose, UA 06/27/2018 NEGATIVE  NEGATIVE mg/dL Final  . Hgb urine dipstick 06/27/2018 SMALL* NEGATIVE Final  . Bilirubin Urine 06/27/2018 NEGATIVE  NEGATIVE Final  . Ketones, ur 06/27/2018 NEGATIVE  NEGATIVE mg/dL Final  . Protein, ur 06/27/2018 NEGATIVE  NEGATIVE mg/dL Final  .  Nitrite 06/27/2018 NEGATIVE  NEGATIVE Final  . Leukocytes, UA 06/27/2018 NEGATIVE  NEGATIVE Final  . RBC / HPF 06/27/2018 0-5  0 - 5 RBC/hpf Final  . WBC, UA 06/27/2018 0-5  0 - 5 WBC/hpf Final  . Bacteria, UA 06/27/2018 NONE SEEN  NONE SEEN Final  . Squamous Epithelial / LPF 06/27/2018 0-5  0 - 5 Final   Performed at [facility], [place] [address] [place], [place] [address]  Hospital Outpatient Visit on 06/18/2018  Component Date Value Ref Range Status  . aPTT 06/18/2018 28  24 - 36 seconds Final   Performed at [facility], [place] [address], [place], [place] [address]  . WBC 06/18/2018 4.7  4.0 - 10.5 K/uL Final  . RBC 06/18/2018 3.98  3.87 - 5.11 MIL/uL Final  . Hemoglobin 06/18/2018 12.2  12.0 - 15.0 g/dL Final  . HCT 06/18/2018 38.3  36.0 - 46.0 % Final  . MCV 06/18/2018 96.2  78.0 - 100.0 fL Final  . MCH 06/18/2018 30.7  26.0 - 34.0 pg Final  . MCHC 06/18/2018 31.9  30.0 - 36.0 g/dL Final  . RDW 06/18/2018 13.6  11.5 - 15.5 % Final  . Platelets 06/18/2018 143* 150 - 400 K/uL Final   Performed at [facility], [place] [address], [place], [place] [address]  . Sodium 06/18/2018 141  135 - 145 mmol/L Final  . Potassium 06/18/2018 4.2  3.5 - 5.1 mmol/L Final  . Chloride 06/18/2018 106  98 - 111 mmol/L Final  . CO2 06/18/2018 29  22 - 32 mmol/L Final  . Glucose, Bld 06/18/2018 105* 70 - 99 mg/dL Final  . BUN 06/18/2018 23  8 - 23 mg/dL Final  . Creatinine, Ser 06/18/2018 0.85  0.44 - 1.00 mg/dL Final  . Calcium 06/18/2018 9.3  8.9 - 10.3 mg/dL Final  . Total Protein 06/18/2018 7.1  6.5 - 8.1 g/dL Final  . Albumin 06/18/2018 3.7  3.5 -  5.0 g/dL Final  . AST 06/18/2018 23  15 - 41 U/L Final  . ALT 06/18/2018 16  0 - 44 U/L Final  . Alkaline Phosphatase 06/18/2018 73  38 - 126 U/L Final  . Total Bilirubin 06/18/2018 0.6  0.3 - 1.2 mg/dL Final  . GFR calc non Af Amer 06/18/2018 >60  >60 mL/min Final  . GFR calc Af Amer 06/18/2018 >60  >60 mL/min Final   Comment: (NOTE) The eGFR has been calculated using the CKD EPI equation. This calculation has not been validated in all clinical situations. eGFR's persistently <60 mL/min signify possible Chronic Kidney Disease.   Georgiann Hahn gap 06/18/2018 6  5 - 15 Final   Performed at [facility], [place] [address], [place], [place] [address]  . Prothrombin Time 06/18/2018 13.1  11.4 - 15.2 seconds Final  . INR 06/18/2018 1.00   Final   Performed at [facility], [place] [address] [place], [place] [address]  . ABO/RH(D) 06/18/2018 B POS   Final  . Antibody Screen 06/18/2018 NEG   Final  . Sample Expiration 06/18/2018 06/26/2018   Final  . Extend sample reason 06/18/2018    Final                   Value:NO TRANSFUSIONS OR PREGNANCY IN THE PAST 3 MONTHS Performed at [facility], [place] [address], [place], [place] [address]   . MRSA, PCR 06/18/2018 NEGATIVE  NEGATIVE Final  . Staphylococcus  aureus 06/18/2018 NEGATIVE  NEGATIVE Final   Comment: (NOTE) The Xpert SA Assay (FDA approved for NASAL specimens in patients 84 years of age and older), is one component of a comprehensive surveillance program. It is not intended to diagnose infection nor to guide or monitor treatment. Performed at [facility], [place] [address] [place], [place] [address]      X-Rays:Dg Chest Port 1 View  Result Date: 06/26/2018 CLINICAL DATA:  Low oxygen saturation EXAM: PORTABLE CHEST 1 VIEW COMPARISON:  Chest x-ray of 12/21/2016 FINDINGS: No active infiltrate or effusion is seen. Mediastinal and hilar contours are stable and mild  cardiomegaly is stable. No acute bony abnormality is seen. IMPRESSION: No active disease. Electronically Signed   By: Ivar Drape M.D.   On: 06/26/2018 10:18    EKG: Orders placed or performed during the hospital encounter of 06/23/18  . EKG 12-Lead  . EKG 12-Lead     Hospital Course: [name] is a 77 y.o. who was admitted to [facility]. They were brought to the operating room on 06/23/2018 and underwent Procedure(s): RIGHT TOTAL KNEE ARTHROPLASTY.  Patient tolerated the procedure well and was later transferred to the recovery room and then to the orthopaedic floor for postoperative care. They were given PO and IV analgesics for pain control following their surgery. They were given 24 hours of postoperative antibiotics of  Anti-infectives (From admission, onward)   Start     Dose/Rate Route Frequency Ordered Stop   06/23/18 1830  ceFAZolin (ANCEF) IVPB 2g/100 mL premix     2 g 200 mL/hr over 30 Minutes Intravenous Every 6 hours 06/23/18 1512 06/24/18 0300   06/23/18 0915  ceFAZolin (ANCEF) IVPB 2g/100 mL premix     2 g 200 mL/hr over 30 Minutes Intravenous On call to O.R. 06/23/18 0914 06/23/18 1137     and started on DVT prophylaxis in the form of Xarelto.   PT and OT were ordered for total joint protocol. Discharge planning consulted to help with postop disposition and equipment needs. Patient had issues with bradycardia on the evening of surgery. HR dropped to the 30s while pt was sleeping. A 500 mL bolus was ordered. Blood pressure was stable, and HR would improve to 40s-low 50s upon awakening. Pt was seen during rounds on POD #1 and hemovac drain was pulled without difficulty. 12-lead EKG showed sinus bradycardia with no evidence of a block. Continued to monitor HR closely. Pt continued to work with therapy into POD #2. Dressing was changed and the incision was clean, dry, and intact with no drainage. Pt had slower progression with physical therapy, and was not yet meeting her  goals for be cleared for discharge. That night, pt was transferred to telemetry room, after becoming very lethargic after sitting on the commode attempting to have a bowel movement. She struggled to answer questions with more than 1-2 words. An EKG was obtained which showed NSR and CBG was 116. Pt came to, and was alert and oriented. During rounds on POD #3, pt was feeling much better, however was still fatigued. O2 saturation was noted to be low at 88%, and a CXR was ordered which showed no infiltrates or signs of active disease. The episode the night prior was determined to be most likely a vasovagal event. On POD #4, pt was feeling well with minor complaints of incontinence and burning with urination. UA ordered which came back within normal limits and no signs of infection. Pt continued to  work with therapy and was ready for discharge from a physical therapy and medical standpoint, however the patient and her husband were both very adamant about staying one more night prior to discharge. She was seen during rounds on POD #5 by Dr. Justice Britain and was felt to be ready for d/c to home. She was given an enema for constipation and worked with physical therapy for three additional sessions. She was discharged to home later that day in stable condition.  Diet: Cardiac diet Activity: WBAT Follow-up: in 2 weeks with Dr. Wynelle Link Disposition: Home with virtual physical therapy Discharged Condition: stable   Discharge Instructions    Call MD / Call 911   Complete by:  As directed    If you experience chest pain or shortness of breath, CALL 911 and be transported to the hospital emergency room.  If you develope a fever above 101 F, pus (white drainage) or increased drainage or redness at the wound, or calf pain, call your surgeon's office.   Change dressing   Complete by:  As directed    Change the dressing daily with sterile 4 x 4 inch gauze dressing and apply TED hose.   Constipation Prevention   Complete  by:  As directed    Drink plenty of fluids.  Prune juice may be helpful.  You may use a stool softener, such as Colace (over the counter) 100 mg twice a day.  Use MiraLax (over the counter) for constipation as needed.   Diet - low sodium heart healthy   Complete by:  As directed    Discharge instructions   Complete by:  As directed    Dr. Gaynelle Arabian Total Joint Specialist [facility] [address], [place], [place] [address] [phone]  TOTAL KNEE REPLACEMENT POSTOPERATIVE DIRECTIONS  Knee Rehabilitation, Guidelines Following Surgery  Results after knee surgery are often greatly improved when you follow the exercise, range of motion and muscle strengthening exercises prescribed by your doctor. Safety measures are also important to protect the knee from further injury. Any time any of these exercises cause you to have increased pain or swelling in your knee joint, decrease the amount until you are comfortable again and slowly increase them. If you have problems or questions, call your caregiver or physical therapist for advice.   HOME CARE INSTRUCTIONS  Remove items at home which could result in a fall. This includes throw rugs or furniture in walking pathways.  ICE to the affected knee every three hours for 30 minutes at a time and then as needed for pain and swelling.  Continue to use ice on the knee for pain and swelling from surgery. You may notice swelling that will progress down to the foot and ankle.  This is normal after surgery.  Elevate the leg when you are not up walking on it.   Continue to use the breathing machine which will help keep your temperature down.  It is common for your temperature to cycle up and down following surgery, especially at night when you are not up moving around and exerting yourself.  The breathing machine keeps your lungs expanded and your temperature down. Do not place pillow under knee, focus on keeping the knee straight while resting    DIET You may resume your previous home diet once your are discharged from the hospital.  DRESSING / WOUND CARE / SHOWERING You may shower 3 days after surgery, but keep the wounds dry during showering.  You may use  an occlusive plastic wrap (Press'n Seal for example), NO SOAKING/SUBMERGING IN THE BATHTUB.  If the bandage gets wet, change with a clean dry gauze.  If the incision gets wet, pat the wound dry with a clean towel. You may start showering once you are discharged home but do not submerge the incision under water. Just pat the incision dry and apply a dry gauze dressing on daily. Change the surgical dressing daily and reapply a dry dressing each time.  ACTIVITY Walk with your walker as instructed. Use walker as long as suggested by your caregivers. Avoid periods of inactivity such as sitting longer than an hour when not asleep. This helps prevent blood clots.  You may resume a sexual relationship in one month or when given the OK by your doctor.  You may return to work once you are cleared by your doctor.  Do not drive a car for 6 weeks or until released by you surgeon.  Do not drive while taking narcotics.  WEIGHT BEARING Weight bearing as tolerated with assist device (walker, cane, etc) as directed, use it as long as suggested by your surgeon or therapist, typically at least 4-6 weeks.  POSTOPERATIVE CONSTIPATION PROTOCOL Constipation - defined medically as fewer than three stools per week and severe constipation as less than one stool per week.  One of the most common issues patients have following surgery is constipation.  Even if you have a regular bowel pattern at home, your normal regimen is likely to be disrupted due to multiple reasons following surgery.  Combination of anesthesia, postoperative narcotics, change in appetite and fluid intake all can affect your bowels.  In order to avoid complications following surgery, here are some recommendations in order to help you  during your recovery period.  Colace (docusate) - Pick up an over-the-counter form of Colace or another stool softener and take twice a day as long as you are requiring postoperative pain medications.  Take with a full glass of water daily.  If you experience loose stools or diarrhea, hold the colace until you stool forms back up.  If your symptoms do not get better within 1 week or if they get worse, check with your doctor.  Dulcolax (bisacodyl) - Pick up over-the-counter and take as directed by the product packaging as needed to assist with the movement of your bowels.  Take with a full glass of water.  Use this product as needed if not relieved by Colace only.   MiraLax (polyethylene glycol) - Pick up over-the-counter to have on hand.  MiraLax is a solution that will increase the amount of water in your bowels to assist with bowel movements.  Take as directed and can mix with a glass of water, juice, soda, coffee, or tea.  Take if you go more than two days without a movement. Do not use MiraLax more than once per day. Call your doctor if you are still constipated or irregular after using this medication for 7 days in a row.  If you continue to have problems with postoperative constipation, please contact the office for further assistance and recommendations.  If you experience "the worst abdominal pain ever" or develop nausea or vomiting, please contact the office immediatly for further recommendations for treatment.  ITCHING  If you experience itching with your medications, try taking only a single pain pill, or even half a pain pill at a time.  You can also use Benadryl over the counter for itching or also to help with sleep.  TED HOSE STOCKINGS Wear the elastic stockings on both legs for three weeks following surgery during the day but you may remove then at night for sleeping.  MEDICATIONS See your medication summary on the "After Visit Summary" that the nursing staff will review with you  prior to discharge.  You may have some home medications which will be placed on hold until you complete the course of blood thinner medication.  It is important for you to complete the blood thinner medication as prescribed by your surgeon.  Continue your approved medications as instructed at time of discharge.  PRECAUTIONS If you experience chest pain or shortness of breath - call 911 immediately for transfer to the hospital emergency department.  If you develop a fever greater that 101 F, purulent drainage from wound, increased redness or drainage from wound, foul odor from the wound/dressing, or calf pain - CONTACT YOUR SURGEON.                                                   FOLLOW-UP APPOINTMENTS Make sure you keep all of your appointments after your operation with your surgeon and caregivers. You should call the office at the above phone number and make an appointment for approximately two weeks after the date of your surgery or on the date instructed by your surgeon outlined in the "After Visit Summary".   RANGE OF MOTION AND STRENGTHENING EXERCISES  Rehabilitation of the knee is important following a knee injury or an operation. After just a few days of immobilization, the muscles of the thigh which control the knee become weakened and shrink (atrophy). Knee exercises are designed to build up the tone and strength of the thigh muscles and to improve knee motion. Often times heat used for twenty to thirty minutes before working out will loosen up your tissues and help with improving the range of motion but do not use heat for the first two weeks following surgery. These exercises can be done on a training (exercise) mat, on the floor, on a table or on a bed. Use what ever works the best and is most comfortable for you Knee exercises include:  Leg Lifts - While your knee is still immobilized in a splint or cast, you can do straight leg raises. Lift the leg to 60 degrees, hold for 3 sec, and  slowly lower the leg. Repeat 10-20 times 2-3 times daily. Perform this exercise against resistance later as your knee gets better.  Quad and Hamstring Sets - Tighten up the muscle on the front of the thigh (Quad) and hold for 5-10 sec. Repeat this 10-20 times hourly. Hamstring sets are done by pushing the foot backward against an object and holding for 5-10 sec. Repeat as with quad sets.  Leg Slides: Lying on your back, slowly slide your foot toward your buttocks, bending your knee up off the floor (only go as far as is comfortable). Then slowly slide your foot back down until your leg is flat on the floor again. Angel Wings: Lying on your back spread your legs to the side as far apart as you can without causing discomfort.  A rehabilitation program following serious knee injuries can speed recovery and prevent re-injury in the future due to weakened muscles. Contact your doctor or a physical therapist for more information on knee rehabilitation.   IF  YOU ARE TRANSFERRED TO A SKILLED REHAB FACILITY If the patient is transferred to a skilled rehab facility following release from the hospital, a list of the current medications will be sent to the facility for the patient to continue.  When discharged from the skilled rehab facility, please have the facility set up the patient's [place] prior to being released. Also, the skilled facility will be responsible for providing the patient with their medications at time of release from the facility to include their pain medication, the muscle relaxants, and their blood thinner medication. If the patient is still at the rehab facility at time of the two week follow up appointment, the skilled rehab facility will also need to assist the patient in arranging follow up appointment in our office and any transportation needs.  MAKE SURE YOU:  Understand these instructions.  Get help right away if you are not doing well or get worse.    Pick up  stool softner and laxative for home use following surgery while on pain medications. Do not submerge incision under water. Please use good hand washing techniques while changing dressing each day. May shower starting three days after surgery. Please use a clean towel to pat the incision dry following showers. Continue to use ice for pain and swelling after surgery. Do not use any lotions or creams on the incision until instructed by your surgeon.   Do not put a pillow under the knee. Place it under the heel.   Complete by:  As directed    Driving restrictions   Complete by:  As directed    No driving for two weeks   TED hose   Complete by:  As directed    Use stockings (TED hose) for three weeks on both leg(s).  You may remove them at night for sleeping.   Weight bearing as tolerated   Complete by:  As directed      Allergies as of 06/28/2018      Reactions   Adalat [nifedipine] Other (See Comments)   Unknown   Hydrocodone Itching, Nausea And Vomiting   Meperidine Hcl Other (See Comments)   Demerol causes hallucinations   Naproxen Nausea And Vomiting      Medication List    STOP taking these medications   diclofenac sodium 1 % Gel Commonly known as:  VOLTAREN   oxyCODONE-acetaminophen 5-325 MG tablet Commonly known as:  PERCOCET/ROXICET   Vitamin D3 5000 units Caps     TAKE these medications   acetaminophen 500 MG tablet Commonly known as:  TYLENOL Take 1,000 mg by mouth every 6 (six) hours as needed for mild pain or headache.   Albuterol Sulfate 108 (90 Base) MCG/ACT Aepb Inhale 2 puffs into the lungs every 6 (six) hours as needed. What changed:  reasons to take this   bimatoprost 0.01 % Soln Commonly known as:  LUMIGAN Place 1 drop into both eyes at bedtime.   cetirizine 10 MG tablet Commonly known as:  ZYRTEC Take 10 mg by mouth at bedtime.   FLUoxetine 20 MG tablet Commonly known as:  PROZAC Take 20 mg by mouth daily.   gabapentin 300 MG  capsule Commonly known as:  NEURONTIN Take 1 capsule (300 mg total) by mouth 3 (three) times daily. Gabapentin 300 mg Protocol Take a 300 mg capsule three times a day for two weeks following surgery. Then take a 300 mg capsule two times a day for two weeks.  Then take a 300 mg  capsule once a day for two weeks.  Then discontinue the Gabapentin.   HYDROmorphone 2 MG tablet Commonly known as:  DILAUDID Take 1-2 tablets (2-4 mg total) by mouth every 6 (six) hours as needed for severe pain. What changed:  when to take this   losartan 50 MG tablet Commonly known as:  COZAAR Take 50 mg by mouth daily.   methocarbamol 500 MG tablet Commonly known as:  ROBAXIN Take 1 tablet (500 mg total) by mouth every 6 (six) hours as needed for muscle spasms. What changed:    when to take this  reasons to take this   montelukast 10 MG tablet Commonly known as:  SINGULAIR Take 10 mg by mouth at bedtime.   nystatin ointment Commonly known as:  MYCOSTATIN Apply 1 application topically 2 (two) times daily as needed (for rash).   nystatin powder Commonly known as:  MYCOSTATIN/NYSTOP Apply 1 g topically daily as needed (for rash).   ondansetron 4 MG tablet Commonly known as:  ZOFRAN Take 1 tablet (4 mg total) by mouth every 8 (eight) hours as needed for nausea or vomiting.   PRILOSEC 40 MG capsule Generic drug:  omeprazole Take 40 mg by mouth 2 (two) times daily.   ranitidine 300 MG tablet Commonly known as:  ZANTAC Take 1 tablet (300 mg total) by mouth at bedtime.   rivaroxaban 10 MG Tabs tablet Commonly known as:  XARELTO Take 1 tablet (10 mg total) by mouth daily with breakfast for 17 days. Then take one 81 mg aspirin once a day for three weeks. Then discontinue aspirin.   rosuvastatin 40 MG tablet Commonly known as:  CRESTOR Take 40 mg by mouth daily.   traMADol 50 MG tablet Commonly known as:  ULTRAM Take 1-2 tablets (50-100 mg total) by mouth every 6 (six) hours as needed for  moderate pain.            Discharge Care Instructions  (From admission, onward)         Start     Ordered   06/25/18 0000  Weight bearing as tolerated     06/25/18 0757   06/25/18 0000  Change dressing    Comments:  Change the dressing daily with sterile 4 x 4 inch gauze dressing and apply TED hose.   06/25/18 0757         Follow-up Information    Gaynelle Arabian, MD. Schedule an appointment as soon as possible for a visit on 07/08/2018.   Specialty:  Orthopedic Surgery Contact information: [address] [place] [address] [phone]           Signed: Theresa Duty, PA-C Orthopedic Surgery 06/30/2018, 9:14 AM

## 2018-07-04 ENCOUNTER — Other Ambulatory Visit: Payer: Self-pay

## 2018-07-04 ENCOUNTER — Encounter (HOSPITAL_COMMUNITY): Payer: Self-pay

## 2018-07-04 ENCOUNTER — Observation Stay (HOSPITAL_COMMUNITY)
Admission: EM | Admit: 2018-07-04 | Discharge: 2018-07-06 | Disposition: A | Discharge status: Home / Self Care | Payer: Medicare Other | Attending: Internal Medicine | Admitting: Internal Medicine

## 2018-07-04 DIAGNOSIS — K922 Gastrointestinal hemorrhage, unspecified: Secondary | ICD-10-CM | POA: Diagnosis present

## 2018-07-04 DIAGNOSIS — Z96651 Presence of right artificial knee joint: Secondary | ICD-10-CM | POA: Diagnosis not present

## 2018-07-04 DIAGNOSIS — I119 Hypertensive heart disease without heart failure: Secondary | ICD-10-CM | POA: Insufficient documentation

## 2018-07-04 DIAGNOSIS — K644 Residual hemorrhoidal skin tags: Secondary | ICD-10-CM | POA: Insufficient documentation

## 2018-07-04 DIAGNOSIS — R710 Precipitous drop in hematocrit: Secondary | ICD-10-CM | POA: Diagnosis not present

## 2018-07-04 DIAGNOSIS — Z7901 Long term (current) use of anticoagulants: Secondary | ICD-10-CM | POA: Insufficient documentation

## 2018-07-04 DIAGNOSIS — Z79899 Other long term (current) drug therapy: Secondary | ICD-10-CM | POA: Diagnosis not present

## 2018-07-04 DIAGNOSIS — F329 Major depressive disorder, single episode, unspecified: Secondary | ICD-10-CM | POA: Insufficient documentation

## 2018-07-04 DIAGNOSIS — M2550 Pain in unspecified joint: Secondary | ICD-10-CM

## 2018-07-04 DIAGNOSIS — R5383 Other fatigue: Secondary | ICD-10-CM | POA: Diagnosis not present

## 2018-07-04 DIAGNOSIS — K219 Gastro-esophageal reflux disease without esophagitis: Secondary | ICD-10-CM | POA: Diagnosis not present

## 2018-07-04 DIAGNOSIS — E785 Hyperlipidemia, unspecified: Secondary | ICD-10-CM | POA: Diagnosis not present

## 2018-07-04 DIAGNOSIS — K625 Hemorrhage of anus and rectum: Secondary | ICD-10-CM

## 2018-07-04 DIAGNOSIS — K648 Other hemorrhoids: Principal | ICD-10-CM | POA: Insufficient documentation

## 2018-07-04 LAB — COMPREHENSIVE METABOLIC PANEL
ALBUMIN: 3.2 g/dL — AB (ref 3.5–5.0)
ALK PHOS: 62 U/L (ref 38–126)
ALT: 23 U/L (ref 0–44)
ANION GAP: 8 (ref 5–15)
AST: 24 U/L (ref 15–41)
BUN: 21 mg/dL (ref 8–23)
CO2: 31 mmol/L (ref 22–32)
Calcium: 9.1 mg/dL (ref 8.9–10.3)
Chloride: 98 mmol/L (ref 98–111)
Creatinine, Ser: 0.91 mg/dL (ref 0.44–1.00)
GFR calc Af Amer: >60 mL/min (ref >60)
GFR calc non Af Amer: >60 mL/min (ref >60)
GLUCOSE: 103 mg/dL — AB (ref 70–99)
POTASSIUM: 4.6 mmol/L (ref 3.5–5.1)
SODIUM: 137 mmol/L (ref 135–145)
TOTAL PROTEIN: 7.1 g/dL (ref 6.5–8.1)
Total Bilirubin: 0.8 mg/dL (ref 0.3–1.2)

## 2018-07-04 LAB — CBC
HCT: 34.9 % — ABNORMAL LOW (ref 36.0–46.0)
HEMATOCRIT: 34.1 % — AB (ref 36.0–46.0)
Hemoglobin: 11 g/dL — ABNORMAL LOW (ref 12.0–15.0)
Hemoglobin: 11 g/dL — ABNORMAL LOW (ref 12.0–15.0)
MCH: 30.1 pg (ref 26.0–34.0)
MCH: 30.7 pg (ref 26.0–34.0)
MCHC: 31.5 g/dL (ref 30.0–36.0)
MCHC: 32.3 g/dL (ref 30.0–36.0)
MCV: 95.4 fL (ref 78.0–100.0)
MCV: 95.3 fL (ref 78.0–100.0)
Platelets: 250 10*3/uL (ref 150–400)
Platelets: 242 10*3/uL (ref 150–400)
RBC: 3.66 MIL/uL — ABNORMAL LOW (ref 3.87–5.11)
RBC: 3.58 MIL/uL — AB (ref 3.87–5.11)
RDW: 13.4 % (ref 11.5–15.5)
RDW: 13.4 % (ref 11.5–15.5)
WBC: 6.8 10*3/uL (ref 4.0–10.5)
WBC: 6.6 10*3/uL (ref 4.0–10.5)

## 2018-07-04 LAB — POC OCCULT BLOOD, ED: Fecal Occult Bld: POSITIVE — AB

## 2018-07-04 LAB — TYPE AND SCREEN
ABO/RH(D): B POS
Antibody Screen: NEGATIVE

## 2018-07-04 LAB — PROTIME-INR
INR: 1.72
PROTHROMBIN TIME: 20 s — AB (ref 11.4–15.2)

## 2018-07-04 MED ORDER — GABAPENTIN 300 MG PO CAPS
300.0000 mg | ORAL_CAPSULE | Freq: Three times a day (TID) | ORAL | Status: DC
  Administered 2018-07-04 – 2018-07-06 (×5): 300 mg via ORAL
  Filled 2018-07-04 (×5): qty 1

## 2018-07-04 MED ORDER — METHOCARBAMOL 500 MG PO TABS
500.0000 mg | ORAL_TABLET | Freq: Four times a day (QID) | ORAL | Status: DC | PRN
  Administered 2018-07-05 (×2): 500 mg via ORAL
  Filled 2018-07-04 (×2): qty 1

## 2018-07-04 MED ORDER — GABAPENTIN 300 MG PO CAPS: 300.0000 mg | ORAL_CAPSULE | Freq: Two times a day (BID) | ORAL | Status: DC

## 2018-07-04 MED ORDER — PANTOPRAZOLE SODIUM 40 MG PO TBEC
40.0000 mg | DELAYED_RELEASE_TABLET | Freq: Every day | ORAL | Status: DC
  Administered 2018-07-04 – 2018-07-06 (×3): 40 mg via ORAL
  Filled 2018-07-04 (×3): qty 1

## 2018-07-04 MED ORDER — GABAPENTIN 300 MG PO CAPS: 300.0000 mg | ORAL_CAPSULE | Freq: Every day | ORAL | Status: DC

## 2018-07-04 MED ORDER — ROSUVASTATIN CALCIUM 20 MG PO TABS
40.0000 mg | ORAL_TABLET | Freq: Every day | ORAL | Status: DC
  Administered 2018-07-04 – 2018-07-06 (×3): 40 mg via ORAL
  Filled 2018-07-04 (×3): qty 2

## 2018-07-04 MED ORDER — SODIUM CHLORIDE 0.9 % IV SOLN
INTRAVENOUS | Status: DC
  Administered 2018-07-04 – 2018-07-05 (×2): via INTRAVENOUS

## 2018-07-04 MED ORDER — HYDROCORTISONE ACETATE 25 MG RE SUPP
25.0000 mg | Freq: Two times a day (BID) | RECTAL | Status: DC
  Administered 2018-07-05 – 2018-07-06 (×3): 25 mg via RECTAL
  Filled 2018-07-04 (×4): qty 1

## 2018-07-04 MED ORDER — HYDROMORPHONE HCL 2 MG PO TABS
2.0000 mg | ORAL_TABLET | Freq: Four times a day (QID) | ORAL | Status: DC | PRN
  Administered 2018-07-04: 2 mg via ORAL
  Administered 2018-07-05: 4 mg via ORAL
  Administered 2018-07-05: 2 mg via ORAL
  Administered 2018-07-05 – 2018-07-06 (×4): 4 mg via ORAL
  Filled 2018-07-04 (×2): qty 2
  Filled 2018-07-04: qty 1
  Filled 2018-07-04: qty 2
  Filled 2018-07-04: qty 1
  Filled 2018-07-04 (×2): qty 2

## 2018-07-04 MED ORDER — GABAPENTIN 300 MG PO CAPS: 300.0000 mg | ORAL_CAPSULE | Freq: Three times a day (TID) | ORAL | Status: DC

## 2018-07-04 MED ORDER — LORATADINE 10 MG PO TABS
10.0000 mg | ORAL_TABLET | Freq: Every day | ORAL | Status: DC
  Administered 2018-07-04 – 2018-07-06 (×3): 10 mg via ORAL
  Filled 2018-07-04 (×3): qty 1

## 2018-07-04 MED ORDER — ALBUTEROL SULFATE (2.5 MG/3ML) 0.083% IN NEBU: 3.0000 mL | INHALATION_SOLUTION | Freq: Four times a day (QID) | RESPIRATORY_TRACT | Status: DC | PRN

## 2018-07-04 MED ORDER — FLUOXETINE HCL 20 MG PO CAPS
20.0000 mg | ORAL_CAPSULE | Freq: Every day | ORAL | Status: DC
  Administered 2018-07-04 – 2018-07-06 (×3): 20 mg via ORAL
  Filled 2018-07-04 (×3): qty 1

## 2018-07-04 NOTE — ED Notes (Signed)
Transporter called for patient transport to IP room

## 2018-07-04 NOTE — Consult Note (Addendum)
Consultation  Referring Provider:  ER MD Ellender Hose Primary Care Physician:  Fanny Bien, MD Primary Gastroenterologist:  Dr.Nandigam  Reason for Consultation:   GI bleeding  HPI: [name] is a 77 y.o. female known to Dr. Silverio Decamp and last seen in our office in October 2018.  Comes in through the emergency room today with complaints of rectal bleeding, onset 3 days ago and noticing bright red blood mixed in with her bowel movement.  Initially at onset she had been straining.  She has not noticed any melena. Patient says the first episode, there was a lot of bright red blood and tiny clots on the tissue, blood in the commode and brown stool which appeared to have some blood mixed in. She is actually not had another bowel movement since but says that every time she sits down on the commode to urinate she passes liquid from her rectum which is bright red blood dripping into the commode.  She denies any rectal pain or discomfort.  She has had some vague lower abdominal discomfort recently off and on, but nothing that she has been concerned about.    She says she still having a lot of pain in her knee and has been requiring tramadol regularly, muscle relaxers and occasionally Dilaudid.  She tends towards constipation and has been using MiraLAX.   Patient underwent right knee replacement on 06/23/2018 and was discharged home on 06/30/2018 on Xarelto. Hemoglobin was 11.4/hematocrit 35.3 on discharge from the hospital. Today in the ER hemoglobin 11 hematocrit of 34.9, pro time 20/INR 1.72  CMET unremarkable.  She has been hemodynamically stable.  Last colonoscopy was done per Dr. Delfin Edis in 2011 with finding of external hemorrhoids, no internal hemorrhoids noted at that time there was mild sigmoid diverticulosis of the cecal pouch was not fully visualized. EGD in 2011 with finding of a 3 to 4 cm non-reducible hiatal hernia otherwise negative exam.   Past Medical History:  Diagnosis  Date  . Anal or rectal pain    sometimes  . Anemia    hx of during pregnancy  . Arthritis   . Asthma    hx of  . Bradycardia    " I KNOW I HAVE BRADYCARDIA ESPECIALLY WHEN I SLEEP"   . Chronic back pain   . Degenerative joint disease    osteo  . Depression   . Diverticulosis 2003  . Dysrhythmia    hx of  due to eye drop and also low heart rate 40's per pt. Dr. Gwenlyn Found follows  . Elevated total protein   . Esophageal dysmotility   . Fibromyalgia   . GERD (gastroesophageal reflux disease)    subsequent Nissen Fundoplication  . Glaucoma   . Hearing loss   . Hemorrhoids   . Hiatal hernia 11/08/09  . Hx of adenomatous colonic polyps 07/02/02  . Hypercalcemia   . Hyperlipidemia   . Hyperlipidemia   . Hypertension   . Nausea   . Osteoporosis   . PONV (postoperative nausea and vomiting)   . Rectal bleeding    from hemorrhoids.    . Sleep apnea    DOES USE CPAP   . Thrombocytopenia ([place])   . Varicose veins of left lower extremity     Past Surgical History:  Procedure Laterality Date  . CARDIAC CATHETERIZATION  03/14/1992   Normal cardiac cath. Normal LV function.  [name] CARDIOVASCULAR STRESS TEST  01/22/2011   No scintigraphic evidence of inducible ischemia.  [name]  CAROTID DOPPLER  03/31/2007   Bilateral ICAs - no evidence of significant diameter reduction, dissectin, tortuosity, FMD, or any other vascular abnormality.  . ESOPHAGEAL MANOMETRY N/A 11/14/2015   Procedure: ESOPHAGEAL MANOMETRY (EM);  Surgeon: Mauri Pole, MD;  Location: [facility];  Service: Endoscopy;  Laterality: N/A;  . GASTRIC RESECTION  2009  . JOINT REPLACEMENT     right knee Dr. Wynelle Link 06-23-18  . NISSEN FUNDOPLICATION  0175   with subsequent takedown in 2009  . TOTAL KNEE ARTHROPLASTY Left 06/10/2017   Procedure: LEFT  TOTAL KNEE ARTHROPLASTY;  Surgeon: Gaynelle Arabian, MD;  Location: [facility];  Service: Orthopedics;  Laterality: Left;  Adductor Block  . TOTAL KNEE ARTHROPLASTY Right 06/23/2018   Procedure:  RIGHT TOTAL KNEE ARTHROPLASTY;  Surgeon: Gaynelle Arabian, MD;  Location: [facility];  Service: Orthopedics;  Laterality: Right;  . TRANSTHORACIC ECHOCARDIOGRAM  12/21/2010   EF 60%, moderate LVH,     Prior to Admission medications   Medication Sig Start Date End Date Taking? Authorizing Provider  Albuterol Sulfate (PROAIR RESPICLICK) 102 (90 Base) MCG/ACT AEPB Inhale 2 puffs into the lungs every 6 (six) hours as needed. Patient taking differently: Inhale 2 puffs into the lungs every 6 (six) hours as needed (for shortness of breath/wheezing).  05/02/16   Mannam, Praveen, MD  bimatoprost (LUMIGAN) 0.01 % SOLN Place 1 drop into both eyes at bedtime.     [provider]  cetirizine (ZYRTEC) 10 MG tablet Take 10 mg by mouth at bedtime.    [provider]  FLUoxetine (PROZAC) 20 MG tablet Take 20 mg by mouth daily.    [provider]  gabapentin (NEURONTIN) 300 MG capsule Take 1 capsule (300 mg total) by mouth 3 (three) times daily. Gabapentin 300 mg Protocol Take a 300 mg capsule three times a day for two weeks following surgery. Then take a 300 mg capsule two times a day for two weeks.  Then take a 300 mg capsule once a day for two weeks.  Then discontinue the Gabapentin. 06/25/18   Edmisten, Kristie L, PA  HYDROmorphone (DILAUDID) 2 MG tablet Take 1-2 tablets (2-4 mg total) by mouth every 6 (six) hours as needed for severe pain. 06/25/18   Edmisten, Kristie L, PA  losartan (COZAAR) 50 MG tablet Take 50 mg by mouth daily.     [provider]  methocarbamol (ROBAXIN) 500 MG tablet Take 1 tablet (500 mg total) by mouth every 6 (six) hours as needed for muscle spasms. 06/25/18   Edmisten, Kristie L, PA  montelukast (SINGULAIR) 10 MG tablet Take 10 mg by mouth at bedtime.    [provider]  nystatin (MYCOSTATIN/NYSTOP) powder Apply 1 g topically daily as needed (for rash).  11/20/16   [provider]  nystatin ointment (MYCOSTATIN) Apply 1 application topically  2 (two) times daily as needed (for rash).  11/20/16   [provider]  omeprazole (PRILOSEC) 40 MG capsule Take 40 mg by mouth 2 (two) times daily.     [provider]  ondansetron (ZOFRAN) 4 MG tablet Take 1 tablet (4 mg total) by mouth every 8 (eight) hours as needed for nausea or vomiting. Patient not taking: Reported on 06/17/2018 07/30/17   Mauri Pole, MD  ranitidine (ZANTAC) 300 MG tablet Take 1 tablet (300 mg total) by mouth at bedtime. 12/12/17   Mauri Pole, MD  rivaroxaban (XARELTO) 10 MG TABS tablet Take 1 tablet (10 mg total) by mouth daily with breakfast for  17 days. Then take one 81 mg aspirin once a day for three weeks. Then discontinue aspirin. 06/27/18 07/14/18  Edmisten, Kristie L, PA  rosuvastatin (CRESTOR) 40 MG tablet Take 40 mg by mouth daily.     [provider]  traMADol (ULTRAM) 50 MG tablet Take 1-2 tablets (50-100 mg total) by mouth every 6 (six) hours as needed for moderate pain. 06/27/18   Edmisten, Kristie L, PA    No current facility-administered medications for this encounter.    Current Outpatient Medications  Medication Sig Dispense Refill  . Albuterol Sulfate (PROAIR RESPICLICK) 161 (90 Base) MCG/ACT AEPB Inhale 2 puffs into the lungs every 6 (six) hours as needed. (Patient taking differently: Inhale 2 puffs into the lungs every 6 (six) hours as needed (for shortness of breath/wheezing). ) 1 each 5  . bimatoprost (LUMIGAN) 0.01 % SOLN Place 1 drop into both eyes at bedtime.     . cetirizine (ZYRTEC) 10 MG tablet Take 10 mg by mouth at bedtime.    [name] FLUoxetine (PROZAC) 20 MG tablet Take 20 mg by mouth daily.    [name] gabapentin (NEURONTIN) 300 MG capsule Take 1 capsule (300 mg total) by mouth 3 (three) times daily. Gabapentin 300 mg Protocol Take a 300 mg capsule three times a day for two weeks following surgery. Then take a 300 mg capsule two times a day for two weeks.  Then take a 300 mg capsule once a day for two weeks.  Then  discontinue the Gabapentin. 84 capsule 0  . HYDROmorphone (DILAUDID) 2 MG tablet Take 1-2 tablets (2-4 mg total) by mouth every 6 (six) hours as needed for severe pain. 56 tablet 0  . losartan (COZAAR) 50 MG tablet Take 50 mg by mouth daily.     . methocarbamol (ROBAXIN) 500 MG tablet Take 1 tablet (500 mg total) by mouth every 6 (six) hours as needed for muscle spasms. 40 tablet 0  . montelukast (SINGULAIR) 10 MG tablet Take 10 mg by mouth at bedtime.    [name] nystatin (MYCOSTATIN/NYSTOP) powder Apply 1 g topically daily as needed (for rash).   3  . nystatin ointment (MYCOSTATIN) Apply 1 application topically 2 (two) times daily as needed (for rash).   0  . omeprazole (PRILOSEC) 40 MG capsule Take 40 mg by mouth 2 (two) times daily.     . ondansetron (ZOFRAN) 4 MG tablet Take 1 tablet (4 mg total) by mouth every 8 (eight) hours as needed for nausea or vomiting. (Patient not taking: Reported on 06/17/2018) 60 tablet 1  . ranitidine (ZANTAC) 300 MG tablet Take 1 tablet (300 mg total) by mouth at bedtime. 90 tablet 3  . rivaroxaban (XARELTO) 10 MG TABS tablet Take 1 tablet (10 mg total) by mouth daily with breakfast for 17 days. Then take one 81 mg aspirin once a day for three weeks. Then discontinue aspirin. 17 tablet 0  . rosuvastatin (CRESTOR) 40 MG tablet Take 40 mg by mouth daily.     . traMADol (ULTRAM) 50 MG tablet Take 1-2 tablets (50-100 mg total) by mouth every 6 (six) hours as needed for moderate pain. 40 tablet 0    Allergies as of 07/04/2018 - Review Complete 07/04/2018  Allergen Reaction Noted  . Adalat [nifedipine] Other (See Comments) 09/17/2013  . Hydrocodone Itching and Nausea And Vomiting 09/18/2013  . Meperidine hcl Other (See Comments)   . Naproxen Nausea And Vomiting     Family History  Problem Relation Age of Onset  .  Heart disease Mother   . Hypertension Mother   . Diabetes Mother   . Diabetes Father   . Kidney disease Father   . Breast cancer Daughter   . Stomach  cancer Maternal Aunt   . Leukemia Maternal Uncle   . Prostate cancer Maternal Uncle   . Stroke Brother   . Cancer Maternal Grandmother   . Kidney disease Maternal Grandfather   . Uterine cancer Maternal Aunt   . Colon cancer Neg Hx     Social History   Socioeconomic History  . Marital status: Married    Spouse name: Not on file  . Number of children: 8  . Years of education: Not on file  . Highest education level: Not on file  Occupational History  . Occupation: retired    Comment: retired Therapist, art.   Social Needs  . Financial resource strain: Not on file  . Food insecurity:    Worry: Not on file    Inability: Not on file  . Transportation needs:    Medical: Not on file    Non-medical: Not on file  Tobacco Use  . Smoking status: Never Smoker  . Smokeless tobacco: Never Used  Substance and Sexual Activity  . Alcohol use: No    Alcohol/week: 0.0 standard drinks  . Drug use: No  . Sexual activity: Yes  Lifestyle  . Physical activity:    Days per week: Not on file    Minutes per session: Not on file  . Stress: Not on file  Relationships  . Social connections:    Talks on phone: Not on file    Gets together: Not on file    Attends religious service: Not on file    Active member of club or organization: Not on file    Attends meetings of clubs or organizations: Not on file    Relationship status: Not on file  . Intimate partner violence:    Fear of current or ex partner: Not on file    Emotionally abused: Not on file    Physically abused: Not on file    Forced sexual activity: Not on file  Other Topics Concern  . Not on file  Social History Narrative   Husband, Jaela Yepez is Next of Kin. Cell # [id number]    Review of Systems: Pertinent positive and negative review of systems were noted in the above HPI section.  All other review of systems was otherwise negative.  Physical Exam: Vital signs in last 24 hours: Temp:  [99.6 F (37.6 C)] 99.6 F (37.6  C) (09/20 1247) Pulse Rate:  [56-68] 56 (09/20 1430) Resp:  [16] 16 (09/20 1430) BP: (129-137)/(50-64) 135/59 (09/20 1430) SpO2:  [96 %-100 %] 96 % (09/20 1430) Weight:  [88.9 kg] 88.9 kg (09/20 1251)   General:   Alert,  Well-developed, well-nourished, older AA female,pleasant and cooperative in NAD Head:  Normocephalic and atraumatic. Eyes:  Sclera clear, no icterus.   Conjunctiva pink. Ears:  Normal auditory acuity. Nose:  No deformity, discharge,  or lesions. Mouth:  No deformity or lesions.   Neck:  Supple; no masses or thyromegaly. Lungs:  Clear throughout to auscultation.   No wheezes, crackles, or rhonchi. Heart:  Regular rate and rhythm; no murmurs, clicks, rubs,  or gallops. Abdomen:  Soft,nontender, BS active,nonpalp mass or hsm.   Rectal:  Deferred -done per ER doctor with external hemorrhoids noted which appear to be nonbleeding  There was  some maroonish stool in the rectal  vault. Msk:  Symmetrical . Pulses:  Normal pulses noted. Extremities: Right knee is swollen and covered with a dressing Neurologic:  Alert and  oriented x4;  grossly normal neurologically. Skin:  Intact without significant lesions or rashes.. Psych:  Alert and cooperative. Normal mood and affect.  Intake/Output from previous day: No intake/output data recorded. Intake/Output this shift: No intake/output data recorded.  Lab Results: Recent Labs    07/04/18 1357  WBC 6.8  HGB 11.0*  HCT 34.9*  PLT 250   BMET Recent Labs    07/04/18 1357  NA 137  K 4.6  CL 98  CO2 31  GLUCOSE 103*  BUN 21  CREATININE 0.91  CALCIUM 9.1   LFT Recent Labs    07/04/18 1357  PROT 7.1  ALBUMIN 3.2*  AST 24  ALT 23  ALKPHOS 62  BILITOT 0.8   PT/INR Recent Labs    07/04/18 1357  LABPROT 20.0*  INR 1.72    IMPRESSION:  107 77 year old African-American female with onset of bright red blood per rectum 3 days ago associated with straining.  At initial episode there was red blood in the commode  which appeared to be mixed with stool.  Since then not had any further bowel movements but has been having oozing of blood from the rectum when she strains to urinate. Hemoglobin has been stable since discharge from the hospital 6 days ago.  I suspect her bleeding is secondary to internal hemorrhoids, however cannot rule out low-grade diverticular bleeding symptoms are not typical.  #2 anticoagulation with Xarelto post right knee replacement 3 status post total right knee replacement 06/23/2018  4 status post left knee replacement fall 2018 #5 GERD #6 previously documented diverticulosis note history of adenomatous polyp 2003.  Last colonoscopy 2011  Plan; full liquid diet Hold Xarelto this evening-Patient is supposed to stay on Xarelto for another 10 to 14 days Serial hemoglobins Anusol HC supp BID If hemoglobin remains stable, may treat for internal hemorrhoids,( banding not an option until can be off Xarelto , and stay off Xarelto)  and have her complete her intended course of Xarelto.  If hemoglobin drops, she may require colonoscopy this admission. I do believe she will need another colonoscopy outpatient if we do not proceed  during hospitalization.  Would let her recover from her very recent knee replacement, and complete course of Xarelto prior to proceeding.   [name]  07/04/2018, 2:47 PM  Agree with Ms. Genia Harold assessment and plan.  Consider anoscopy tomorrow.  Gatha Mayer, MD, [facility]

## 2018-07-04 NOTE — ED Notes (Signed)
ED Provider at bedside. 

## 2018-07-04 NOTE — ED Provider Notes (Signed)
[facility] DEPT Provider Note   CSN: [id number] Arrival date & time: 07/04/18  1233     History   Chief Complaint Chief Complaint  Patient presents with  . Rectal Bleeding    HPI [name] is a 77 y.o. female.  HPI 77 year old female with extensive past medical history as below status post recent right knee replacement here with rectal bleeding.  The patient states that over the last 3 days, she has noticed bright red blood in her stool.  The symptoms started initially after she was trying to have a bowel movement after surgery.  She was straining.  She experienced bright red blood in the toilet bowl and dripping blood at that time.  Since then, she has had persistent bright red blood in her bowel movements as well as intermittently when she is urinating.  She said some associated mild lower abdominal fullness.  She denies any nausea or vomiting.  She has a history of polyps on colonoscopy but denies known history of diverticulosis.  She does have a history of hemorrhoids.  She is currently on Xarelto for her knee replacement.  She was on Xarelto last year following knee replacement did not have any bleeding.  Denies any rectal pain.  No specific alleviating factors.   Past Medical History:  Diagnosis Date  . Anal or rectal pain    sometimes  . Anemia    hx of during pregnancy  . Arthritis   . Asthma    hx of  . Bradycardia    " I KNOW I HAVE BRADYCARDIA ESPECIALLY WHEN I SLEEP"   . Chronic back pain   . Degenerative joint disease    osteo  . Depression   . Diverticulosis 2003  . Dysrhythmia    hx of  due to eye drop and also low heart rate 40's per pt. Dr. Gwenlyn Found follows  . Elevated total protein   . Esophageal dysmotility   . Fibromyalgia   . GERD (gastroesophageal reflux disease)    subsequent Nissen Fundoplication  . Glaucoma   . Hearing loss   . Hemorrhoids   . Hiatal hernia 11/08/09  . Hx of adenomatous colonic polyps 07/02/02  .  Hypercalcemia   . Hyperlipidemia   . Hyperlipidemia   . Hypertension   . Nausea   . Osteoporosis   . PONV (postoperative nausea and vomiting)   . Rectal bleeding    from hemorrhoids.    . Sleep apnea    DOES USE CPAP   . Thrombocytopenia ([place])   . Varicose veins of left lower extremity     Patient Active Problem List   Diagnosis Date Noted  . GI bleed 07/04/2018  . Preoperative clearance 05/21/2018  . Bradycardia 07/27/2016  . Cough   . Essential hypertension 11/24/2014  . Gonalgia 05/13/2013  . Cellulitis 05/13/2013  . OA (osteoarthritis) of knee 05/13/2013  . Degenerative joint disease involving multiple joints 01/19/2013  . Rheumatoid arthritis ([place]) 01/19/2013  . Thyroid nodule 06/11/2012  . Thrombocytopenia ([place])   . Elevated total protein   . Hypercalcemia   . Cyst of thyroid 10/29/2011  . Hemorrhoids, internal 05/28/2011  . Acid reflux 02/14/2011  . BP (high blood pressure) 02/14/2011  . Avitaminosis D 02/13/2011  . Glaucoma 11/28/2010  . HYPERLIPIDEMIA 12/14/2009  . HEMORRHOIDS 12/14/2009  . DIVERTICULOSIS, COLON 12/14/2009  . HIP PAIN, CHRONIC 12/14/2009  . BACK PAIN, CHRONIC 12/14/2009  . FIBROMYALGIA 12/14/2009  . OSTEOPOROSIS 12/14/2009  . SLEEP  APNEA 12/14/2009  . COLONIC POLYPS, ADENOMATOUS, HX OF 12/14/2009  . GASTRIC POLYP, HX OF 12/14/2009  . HIATAL HERNIA 10/04/2009  . DEPRESSION 09/29/2009  . REFLUX ESOPHAGITIS 09/29/2009  . GERD 09/29/2009  . CONSTIPATION 09/29/2009  . DYSPHAGIA 09/29/2009    Past Surgical History:  Procedure Laterality Date  . CARDIAC CATHETERIZATION  03/14/1992   Normal cardiac cath. Normal LV function.  [name] CARDIOVASCULAR STRESS TEST  01/22/2011   No scintigraphic evidence of inducible ischemia.  [name] CAROTID DOPPLER  03/31/2007   Bilateral ICAs - no evidence of significant diameter reduction, dissectin, tortuosity, FMD, or any other vascular abnormality.  . ESOPHAGEAL MANOMETRY N/A 11/14/2015   Procedure: ESOPHAGEAL  MANOMETRY (EM);  Surgeon: Mauri Pole, MD;  Location: [facility];  Service: Endoscopy;  Laterality: N/A;  . GASTRIC RESECTION  2009  . JOINT REPLACEMENT     right knee Dr. Wynelle Link 06-23-18  . NISSEN FUNDOPLICATION  5093   with subsequent takedown in 2009  . TOTAL KNEE ARTHROPLASTY Left 06/10/2017   Procedure: LEFT  TOTAL KNEE ARTHROPLASTY;  Surgeon: Gaynelle Arabian, MD;  Location: [facility]S;  Service: Orthopedics;  Laterality: Left;  Adductor Block  . TOTAL KNEE ARTHROPLASTY Right 06/23/2018   Procedure: RIGHT TOTAL KNEE ARTHROPLASTY;  Surgeon: Gaynelle Arabian, MD;  Location: [facility];  Service: Orthopedics;  Laterality: Right;  . TRANSTHORACIC ECHOCARDIOGRAM  12/21/2010   EF 60%, moderate LVH,      OB History   None      Home Medications    Prior to Admission medications   Medication Sig Start Date End Date Taking? Authorizing Provider  Albuterol Sulfate (PROAIR RESPICLICK) 267 (90 Base) MCG/ACT AEPB Inhale 2 puffs into the lungs every 6 (six) hours as needed. Patient taking differently: Inhale 2 puffs into the lungs every 6 (six) hours as needed (for shortness of breath/wheezing).  05/02/16  Yes Mannam, Praveen, MD  bimatoprost (LUMIGAN) 0.01 % SOLN Place 1 drop into both eyes at bedtime.    Yes [provider]  cetirizine (ZYRTEC) 10 MG tablet Take 10 mg by mouth at bedtime.   Yes [provider]  FLUoxetine (PROZAC) 20 MG tablet Take 20 mg by mouth daily.   Yes [provider]  gabapentin (NEURONTIN) 300 MG capsule Take 1 capsule (300 mg total) by mouth 3 (three) times daily. Gabapentin 300 mg Protocol Take a 300 mg capsule three times a day for two weeks following surgery. Then take a 300 mg capsule two times a day for two weeks.  Then take a 300 mg capsule once a day for two weeks.  Then discontinue the Gabapentin. 06/25/18  Yes Edmisten, Kristie L, PA  HYDROmorphone (DILAUDID) 2 MG tablet Take 1-2 tablets (2-4 mg total) by mouth every 6 (six) hours as  needed for severe pain. 06/25/18  Yes Edmisten, Kristie L, PA  losartan (COZAAR) 50 MG tablet Take 50 mg by mouth daily.    Yes [provider]  methocarbamol (ROBAXIN) 500 MG tablet Take 1 tablet (500 mg total) by mouth every 6 (six) hours as needed for muscle spasms. 06/25/18  Yes Edmisten, Kristie L, PA  montelukast (SINGULAIR) 10 MG tablet Take 10 mg by mouth at bedtime.   Yes [provider]  nystatin (MYCOSTATIN/NYSTOP) powder Apply 1 g topically daily as needed (for rash).  11/20/16  Yes [provider]  nystatin ointment (MYCOSTATIN) Apply 1 application topically 2 (two) times daily as needed (for rash).  11/20/16  Yes [provider]  omeprazole (PRILOSEC) 40 MG capsule Take 40 mg by mouth daily.    Yes [provider]  ondansetron (ZOFRAN) 4 MG tablet Take 1 tablet (4 mg total) by mouth every 8 (eight) hours as needed for nausea or vomiting. 07/30/17  Yes Nandigam, Venia Minks, MD  ranitidine (ZANTAC) 300 MG tablet Take 1 tablet (300 mg total) by mouth at bedtime. 12/12/17  Yes Nandigam, Venia Minks, MD  rivaroxaban (XARELTO) 10 MG TABS tablet Take 1 tablet (10 mg total) by mouth daily with breakfast for 17 days. Then take one 81 mg aspirin once a day for three weeks. Then discontinue aspirin. 06/27/18 07/14/18 Yes Edmisten, Kristie L, PA  rosuvastatin (CRESTOR) 40 MG tablet Take 40 mg by mouth daily.    Yes [provider]  traMADol (ULTRAM) 50 MG tablet Take 1-2 tablets (50-100 mg total) by mouth every 6 (six) hours as needed for moderate pain. 06/27/18  Yes Edmisten, Ok Anis, PA    Family History Family History  Problem Relation Age of Onset  . Heart disease Mother   . Hypertension Mother   . Diabetes Mother   . Diabetes Father   . Kidney disease Father   . Breast cancer Daughter   . Stomach cancer Maternal Aunt   . Leukemia Maternal Uncle   . Prostate cancer Maternal Uncle   . Stroke Brother   . Cancer Maternal Grandmother   . Kidney  disease Maternal Grandfather   . Uterine cancer Maternal Aunt   . Colon cancer Neg Hx     Social History Social History   Tobacco Use  . Smoking status: Never Smoker  . Smokeless tobacco: Never Used  Substance Use Topics  . Alcohol use: No    Alcohol/week: 0.0 standard drinks  . Drug use: No     Allergies   Adalat [nifedipine]; Hydrocodone; Meperidine hcl; and Naproxen   Review of Systems Review of Systems  Constitutional: Positive for fatigue. Negative for chills and fever.  HENT: Negative for congestion and rhinorrhea.   Eyes: Negative for visual disturbance.  Respiratory: Negative for cough, shortness of breath and wheezing.   Cardiovascular: Negative for chest pain and leg swelling.  Gastrointestinal: Positive for blood in stool. Negative for abdominal pain, diarrhea, nausea and vomiting.  Genitourinary: Negative for dysuria and flank pain.  Musculoskeletal: Negative for neck pain and neck stiffness.  Skin: Negative for rash and wound.  Allergic/Immunologic: Negative for immunocompromised state.  Neurological: Negative for syncope, weakness and headaches.  All other systems reviewed and are negative.    Physical Exam Updated Vital Signs BP (!) 135/59   Pulse (!) 56   Temp 99.6 F (37.6 C) (Oral)   Resp 16   Ht 5\' 10"  (1.778 m)   Wt 88.9 kg   SpO2 96%   BMI 28.12 kg/m   Physical Exam  Constitutional: She is oriented to person, place, and time. She appears well-developed and well-nourished. No distress.  HENT:  Head: Normocephalic and atraumatic.  Eyes: Conjunctivae are normal.  Neck: Neck supple.  Cardiovascular: Normal rate, regular rhythm and normal heart sounds. Exam reveals no friction rub.  No murmur heard. Pulmonary/Chest: Effort normal and breath sounds normal. No respiratory distress. She has no wheezes. She has no rales.  Abdominal: She exhibits no distension.  Genitourinary:  Genitourinary Comments: Nonthrombosed external hemorrhoids noted,  without signs of active bleeding.  [place], grossly bloody stool noted in rectal vault.  No melena.  Musculoskeletal: She exhibits no edema.  Right knee appears  clean, dry, intact.  Minimal erythema and tenderness.  Neurological: She is alert and oriented to person, place, and time. She exhibits normal muscle tone.  Skin: Skin is warm. Capillary refill takes less than 2 seconds.  Psychiatric: She has a normal mood and affect.  Nursing note and vitals reviewed.    ED Treatments / Results  Labs (all labs ordered are listed, but only abnormal results are displayed) Labs Reviewed  COMPREHENSIVE METABOLIC PANEL - Abnormal; Notable for the following components:      Result Value   Glucose, Bld 103 (*)    Albumin 3.2 (*)    All other components within normal limits  CBC - Abnormal; Notable for the following components:   RBC 3.66 (*)    Hemoglobin 11.0 (*)    HCT 34.9 (*)    All other components within normal limits  PROTIME-INR - Abnormal; Notable for the following components:   Prothrombin Time 20.0 (*)    All other components within normal limits  POC OCCULT BLOOD, ED - Abnormal; Notable for the following components:   Fecal Occult Bld POSITIVE (*)    All other components within normal limits  TYPE AND SCREEN    EKG None  Radiology No results found.  Procedures Procedures (including critical care time)  Medications Ordered in ED Medications - No data to display   Initial Impression / Assessment and Plan / ED Course  I have reviewed the triage vital signs and the nursing notes.  Pertinent labs & imaging results that were available during my care of the patient were reviewed by me and considered in my medical decision making (see chart for details).     77 year old female here with bright red blood after recent knee surgery.  Patient will need to be on Xarelto for her postop course.  She does have hemorrhoids, but history of diverticulosis on CT scan per my review.   Discussed case with [place] of lumbar GI.  Will admit for observation and serial CBCs.  GI to see. No specific LLQ TTP to suggest diverticulitis and WBC normal.  Final Clinical Impressions(s) / ED Diagnoses   Final diagnoses:  Rectal bleeding    ED Discharge Orders    None       Duffy Bruce, MD 07/04/18 1541

## 2018-07-04 NOTE — ED Triage Notes (Addendum)
Patient reports that she has had bright red rectal bleeding even without a stool present x 2 days (7 episodes).  Patient denies any abdominal pain, N/V.  Patient reports that she had knee surgery last week and is taking Xarelto.

## 2018-07-04 NOTE — H&P (Signed)
History and Physical    [name] YQM:[id number] DOB: [birth year] DOA: 07/04/2018  I have briefly reviewed the patient's prior medical records in [facility]  PCP: Cheryl Bien, MD  Patient coming from: home  Chief Complaint: Rectal bleeding  HPI: [name] is a 77 y.o. female with medical history significant of hypertension, hyperlipidemia, osteoarthritis status post right knee replacement 1 to 2 weeks ago, currently on Xarelto for DVT prophylaxis and undergoing physical therapy, who presents to the hospital with 2 days of bright red blood per rectum.  This started yesterday, tells me that she had to strain and noticed some blood mixed with stool.  The bleeding persisted and today she is bleeding even without having a bowel movement.  She has had to come to the ED.  She denies any abdominal pain, has no nausea or vomiting.  She denies any diarrhea.  She does complain of lightheadedness when standing, but this has been ongoing since her surgery.  She has had no fever or chills, no chest pain, no shortness of breath.  She has been doing fairly well with her knee but still has difficulties and requires assistance to stand up and still walks with a walker.  ED Course: In the emergency room her vital signs are stable, she is afebrile normotensive, she is not tachycardic not orthostatic.  She is satting well on room air.  Blood work is fairly unremarkable except for hemoglobin of 11.0 which is slightly decreased from her baseline of 12.  EDP consulted gastroenterology who recommended overnight admission on the hospitalist service and they will see in consultation.  Review of Systems: As per HPI otherwise 10 point review of systems negative.   Past Medical History:  Diagnosis Date  . Anal or rectal pain    sometimes  . Anemia    hx of during pregnancy  . Arthritis   . Asthma    hx of  . Bradycardia    " I KNOW I HAVE BRADYCARDIA ESPECIALLY WHEN I SLEEP"   . Chronic back pain   .  Degenerative joint disease    osteo  . Depression   . Diverticulosis 2003  . Dysrhythmia    hx of  due to eye drop and also low heart rate 40's per pt. Dr. Gwenlyn Found follows  . Elevated total protein   . Esophageal dysmotility   . Fibromyalgia   . GERD (gastroesophageal reflux disease)    subsequent Nissen Fundoplication  . Glaucoma   . Hearing loss   . Hemorrhoids   . Hiatal hernia 11/08/09  . Hx of adenomatous colonic polyps 07/02/02  . Hypercalcemia   . Hyperlipidemia   . Hyperlipidemia   . Hypertension   . Nausea   . Osteoporosis   . PONV (postoperative nausea and vomiting)   . Rectal bleeding    from hemorrhoids.    . Sleep apnea    DOES USE CPAP   . Thrombocytopenia ([place])   . Varicose veins of left lower extremity     Past Surgical History:  Procedure Laterality Date  . CARDIAC CATHETERIZATION  03/14/1992   Normal cardiac cath. Normal LV function.  [name] CARDIOVASCULAR STRESS TEST  01/22/2011   No scintigraphic evidence of inducible ischemia.  [name] CAROTID DOPPLER  03/31/2007   Bilateral ICAs - no evidence of significant diameter reduction, dissectin, tortuosity, FMD, or any other vascular abnormality.  . ESOPHAGEAL MANOMETRY N/A 11/14/2015   Procedure: ESOPHAGEAL MANOMETRY (EM);  Surgeon: Mauri Pole,  MD;  Location: [facility];  Service: Endoscopy;  Laterality: N/A;  . GASTRIC RESECTION  2009  . JOINT REPLACEMENT     right knee Dr. Wynelle Link 06-23-18  . NISSEN FUNDOPLICATION  8469   with subsequent takedown in 2009  . TOTAL KNEE ARTHROPLASTY Left 06/10/2017   Procedure: LEFT  TOTAL KNEE ARTHROPLASTY;  Surgeon: Gaynelle Arabian, MD;  Location: [facility];  Service: Orthopedics;  Laterality: Left;  Adductor Block  . TOTAL KNEE ARTHROPLASTY Right 06/23/2018   Procedure: RIGHT TOTAL KNEE ARTHROPLASTY;  Surgeon: Gaynelle Arabian, MD;  Location: [facility];  Service: Orthopedics;  Laterality: Right;  . TRANSTHORACIC ECHOCARDIOGRAM  12/21/2010   EF 60%, moderate LVH,      reports that she has  never smoked. She has never used smokeless tobacco. She reports that she does not drink alcohol or use drugs.  Allergies  Allergen Reactions  . Adalat [Nifedipine] Other (See Comments)    Unknown  . Hydrocodone Itching and Nausea And Vomiting  . Meperidine Hcl Other (See Comments)    Demerol causes hallucinations  . Naproxen Nausea And Vomiting    Family History  Problem Relation Age of Onset  . Heart disease Mother   . Hypertension Mother   . Diabetes Mother   . Diabetes Father   . Kidney disease Father   . Breast cancer Daughter   . Stomach cancer Maternal Aunt   . Leukemia Maternal Uncle   . Prostate cancer Maternal Uncle   . Stroke Brother   . Cancer Maternal Grandmother   . Kidney disease Maternal Grandfather   . Uterine cancer Maternal Aunt   . Colon cancer Neg Hx     Prior to Admission medications   Medication Sig Start Date End Date Taking? Authorizing Provider  Albuterol Sulfate (PROAIR RESPICLICK) 629 (90 Base) MCG/ACT AEPB Inhale 2 puffs into the lungs every 6 (six) hours as needed. Patient taking differently: Inhale 2 puffs into the lungs every 6 (six) hours as needed (for shortness of breath/wheezing).  05/02/16  Yes Mannam, Praveen, MD  bimatoprost (LUMIGAN) 0.01 % SOLN Place 1 drop into both eyes at bedtime.    Yes [provider]  cetirizine (ZYRTEC) 10 MG tablet Take 10 mg by mouth at bedtime.   Yes [provider]  FLUoxetine (PROZAC) 20 MG tablet Take 20 mg by mouth daily.   Yes [provider]  gabapentin (NEURONTIN) 300 MG capsule Take 1 capsule (300 mg total) by mouth 3 (three) times daily. Gabapentin 300 mg Protocol Take a 300 mg capsule three times a day for two weeks following surgery. Then take a 300 mg capsule two times a day for two weeks.  Then take a 300 mg capsule once a day for two weeks.  Then discontinue the Gabapentin. 06/25/18  Yes Edmisten, Kristie L, PA  HYDROmorphone (DILAUDID) 2 MG tablet Take 1-2 tablets (2-4  mg total) by mouth every 6 (six) hours as needed for severe pain. 06/25/18  Yes Edmisten, Kristie L, PA  losartan (COZAAR) 50 MG tablet Take 50 mg by mouth daily.    Yes [provider]  methocarbamol (ROBAXIN) 500 MG tablet Take 1 tablet (500 mg total) by mouth every 6 (six) hours as needed for muscle spasms. 06/25/18  Yes Edmisten, Kristie L, PA  montelukast (SINGULAIR) 10 MG tablet Take 10 mg by mouth at bedtime.   Yes [provider]  nystatin (MYCOSTATIN/NYSTOP) powder Apply 1 g topically daily as needed (for rash).  11/20/16  Yes [provider]  nystatin ointment (MYCOSTATIN) Apply 1 application topically 2 (two) times daily as needed (for rash).  11/20/16  Yes [provider]  omeprazole (PRILOSEC) 40 MG capsule Take 40 mg by mouth daily.    Yes [provider]  ondansetron (ZOFRAN) 4 MG tablet Take 1 tablet (4 mg total) by mouth every 8 (eight) hours as needed for nausea or vomiting. 07/30/17  Yes Nandigam, Venia Minks, MD  ranitidine (ZANTAC) 300 MG tablet Take 1 tablet (300 mg total) by mouth at bedtime. 12/12/17  Yes Nandigam, Venia Minks, MD  rivaroxaban (XARELTO) 10 MG TABS tablet Take 1 tablet (10 mg total) by mouth daily with breakfast for 17 days. Then take one 81 mg aspirin once a day for three weeks. Then discontinue aspirin. 06/27/18 07/14/18 Yes Edmisten, Kristie L, PA  rosuvastatin (CRESTOR) 40 MG tablet Take 40 mg by mouth daily.    Yes [provider]  traMADol (ULTRAM) 50 MG tablet Take 1-2 tablets (50-100 mg total) by mouth every 6 (six) hours as needed for moderate pain. 06/27/18  Yes Derl Barrow, [place]    Physical Exam: Vitals:   07/04/18 1430 07/04/18 1500 07/04/18 1530 07/04/18 1630  BP: (!) 135/59 (!) 158/64 (!) 159/62 (!) 151/57  Pulse: (!) 56 (!) 58 60 (!) 58  Resp: 16   16  Temp:      TempSrc:      SpO2: 96% 100% 100% 100%  Weight:      Height:        Constitutional: NAD, calm, comfortable Eyes: PERRL, lids and  conjunctivae normal ENMT: Mucous membranes are moist. Neck: normal, supple Respiratory: clear to auscultation bilaterally, no wheezing, no crackles. Normal respiratory effort. No accessory muscle use.  Cardiovascular: Regular rate and rhythm, no murmurs / rubs / gallops. No extremity edema. 2+ pedal pulses.  Abdomen: no tenderness, no masses palpated. Bowel sounds positive.  Musculoskeletal: no clubbing / cyanosis. Normal muscle tone.  Right knee Ace wrapped with an ice pack on top Skin: no rashes, lesions, ulcers. No induration Neurologic: CN 2-12 grossly intact. Strength 5/5 in all 4.  Psychiatric: Normal judgment and insight. Alert and oriented x 3. Normal mood.   Labs on Admission: I have personally reviewed following labs and imaging studies  CBC: Recent Labs  Lab 07/04/18 1357  WBC 6.8  HGB 11.0*  HCT 34.9*  MCV 95.4  PLT 010   Basic Metabolic Panel: Recent Labs  Lab 07/04/18 1357  NA 137  K 4.6  CL 98  CO2 31  GLUCOSE 103*  BUN 21  CREATININE 0.91  CALCIUM 9.1   GFR: Estimated Creatinine Clearance: 64.7 mL/min (by C-G formula based on SCr of 0.91 mg/dL). Liver Function Tests: Recent Labs  Lab 07/04/18 1357  AST 24  ALT 23  ALKPHOS 62  BILITOT 0.8  PROT 7.1  ALBUMIN 3.2*   No results for input(s): LIPASE, AMYLASE in the last 168 hours. No results for input(s): AMMONIA in the last 168 hours. Coagulation Profile: Recent Labs  Lab 07/04/18 1357  INR 1.72   Cardiac Enzymes: No results for input(s): CKTOTAL, CKMB, CKMBINDEX, TROPONINI in the last 168 hours. BNP (last 3 results) No results for input(s): PROBNP in the last 8760 hours. HbA1C: No results for input(s): HGBA1C in the last 72 hours. CBG: No results for input(s): GLUCAP in the last 168 hours. Lipid Profile: No results for input(s): CHOL, HDL, LDLCALC, TRIG, CHOLHDL, LDLDIRECT in the last 72 hours. Thyroid Function Tests:  No results for input(s): TSH, T4TOTAL, FREET4, T3FREE, THYROIDAB in  the last 72 hours. Anemia Panel: No results for input(s): VITAMINB12, FOLATE, FERRITIN, TIBC, IRON, RETICCTPCT in the last 72 hours. Urine analysis:    Component Value Date/Time   COLORURINE STRAW (A) 06/27/2018 0811   APPEARANCEUR CLEAR 06/27/2018 0811   LABSPEC 1.008 06/27/2018 0811   PHURINE 9.0 (H) 06/27/2018 0811   GLUCOSEU NEGATIVE 06/27/2018 0811   HGBUR SMALL (A) 06/27/2018 0811   BILIRUBINUR NEGATIVE 06/27/2018 0811   KETONESUR NEGATIVE 06/27/2018 0811   PROTEINUR NEGATIVE 06/27/2018 0811   UROBILINOGEN 0.2 06/09/2015 1931   NITRITE NEGATIVE 06/27/2018 0811   LEUKOCYTESUR NEGATIVE 06/27/2018 0811     Radiological Exams on Admission: No results found.   Assessment/Plan Active Problems:   GI bleed    Lower GI bleed -sounds like hemorrhoidal bleed, Hb with just a one-point drop compared to couple weeks ago which also may be postop.  Discussed with Nicoletta Ba, PA with gastroenterology, they recommend overnight admission and monitoring of her blood counts.  Dr. Carlean Purl to see and make further recommendations -Monitor CBCs every 6 hours, last dose of Xarelto was this morning  Status post right knee replacement by Dr. Wynelle Link -Hold Xarelto for now, she is supposed to continue it for DVT prophylaxis until the end of this month.  She is not yet quite mobile and suspect she would benefit from continuing Xarelto if safe from GI standpoint -Continue home Dilaudid dose  Hypertension -Blood pressure stable in the ED, hold Cozaar at least overnight and resume tomorrow if she does not have any hypotensive episodes  Hyperlipidemia -Continue Crestor  Depression -Continue Prozac   DVT prophylaxis: SCDs  Code Status: Full code  Family Communication: husband present at bedside  Disposition Plan: home when ready  Consults called: GI     Admission status: Observation   At the point of initial evaluation, it is my clinical opinion that admission for OBSERVATION is  reasonable and necessary because the patient's presenting complaints in the context of their chronic conditions represent sufficient risk of deterioration or significant morbidity to constitute reasonable grounds for close observation in the hospital setting, but that the patient may be medically stable for discharge from the hospital within 24 to 48 hours.     Marzetta Board, MD [facility] Pager [phone]  If 7PM-7AM, please contact night-coverage [email or web address] Password [facility]  07/04/2018, 5:37 PM

## 2018-07-04 NOTE — ED Notes (Signed)
ED TO INPATIENT HANDOFF REPORT  Name/Age/Gender [name] 77 y.o. female  Code Status Code Status History    Date Active Date Inactive Code Status Order ID Comments User Context   06/23/2018 1512 06/28/2018 1541 Full Code [id number]  Gaynelle Arabian, MD Inpatient   06/10/2017 1014 06/12/2017 1415 Full Code [id number]  Gaynelle Arabian, MD Inpatient    Advance Directive Documentation     Most Recent Value  Type of Advance Directive  Healthcare Power of Attorney, Living will  Pre-existing out of facility DNR order (yellow form or pink MOST form)  -  "MOST" Form in Place?  -      Home/SNF/Other Home  Chief Complaint rectal bleeding  Level of Care/Admitting Diagnosis ED Disposition    ED Disposition Condition [place]: [facility] [100102]  Level of Care: Med-Surg [16]  Diagnosis: GI bleed [071219]  Admitting Physician: Caren Griffins [[id number]]  Attending Physician: Caren Griffins [[id number]]  PT Class (Do Not Modify): Observation [104]  PT Acc Code (Do Not Modify): Observation [10022]       Medical History Past Medical History:  Diagnosis Date  . Anal or rectal pain    sometimes  . Anemia    hx of during pregnancy  . Arthritis   . Asthma    hx of  . Bradycardia    " I KNOW I HAVE BRADYCARDIA ESPECIALLY WHEN I SLEEP"   . Chronic back pain   . Degenerative joint disease    osteo  . Depression   . Diverticulosis 2003  . Dysrhythmia    hx of  due to eye drop and also low heart rate 40's per pt. Dr. Gwenlyn Found follows  . Elevated total protein   . Esophageal dysmotility   . Fibromyalgia   . GERD (gastroesophageal reflux disease)    subsequent Nissen Fundoplication  . Glaucoma   . Hearing loss   . Hemorrhoids   . Hiatal hernia 11/08/09  . Hx of adenomatous colonic polyps 07/02/02  . Hypercalcemia   . Hyperlipidemia   . Hyperlipidemia   . Hypertension   . Nausea   . Osteoporosis   . PONV (postoperative nausea and vomiting)   .  Rectal bleeding    from hemorrhoids.    . Sleep apnea    DOES USE CPAP   . Thrombocytopenia ([place])   . Varicose veins of left lower extremity     Allergies Allergies  Allergen Reactions  . Adalat [Nifedipine] Other (See Comments)    Unknown  . Hydrocodone Itching and Nausea And Vomiting  . Meperidine Hcl Other (See Comments)    Demerol causes hallucinations  . Naproxen Nausea And Vomiting    IV Location/Drains/Wounds Patient Lines/Drains/Airways Status   Active Line/Drains/Airways    Name:   Placement date:   Placement time:   Site:   Days:   Peripheral IV 07/04/18 Left Antecubital   07/04/18    1359    Antecubital   less than 1   Incision (Closed) 06/23/18 Knee Right   06/23/18    1238     11          Labs/Imaging Results for orders placed or performed during the hospital encounter of 07/04/18 (from the past 48 hour(s))  Comprehensive metabolic panel     Status: Abnormal   Collection Time: 07/04/18  1:57 PM  Result Value Ref Range   Sodium 137 135 - 145 mmol/L   Potassium 4.6 3.5 -  5.1 mmol/L   Chloride 98 98 - 111 mmol/L   CO2 31 22 - 32 mmol/L   Glucose, Bld 103 (H) 70 - 99 mg/dL   BUN 21 8 - 23 mg/dL   Creatinine, Ser 0.91 0.44 - 1.00 mg/dL   Calcium 9.1 8.9 - 10.3 mg/dL   Total Protein 7.1 6.5 - 8.1 g/dL   Albumin 3.2 (L) 3.5 - 5.0 g/dL   AST 24 15 - 41 U/L   ALT 23 0 - 44 U/L   Alkaline Phosphatase 62 38 - 126 U/L   Total Bilirubin 0.8 0.3 - 1.2 mg/dL   GFR calc non Af Amer >60 >60 mL/min   GFR calc Af Amer >60 >60 mL/min    Comment: (NOTE) The eGFR has been calculated using the CKD EPI equation. This calculation has not been validated in all clinical situations. eGFR's persistently <60 mL/min signify possible Chronic Kidney Disease.    Anion gap 8 5 - 15    Comment: Performed at [facility], [place] [address], [place], [place] [address]  CBC     Status: Abnormal   Collection Time: 07/04/18  1:57 PM  Result Value Ref Range   WBC 6.8  4.0 - 10.5 K/uL   RBC 3.66 (L) 3.87 - 5.11 MIL/uL   Hemoglobin 11.0 (L) 12.0 - 15.0 g/dL   HCT 34.9 (L) 36.0 - 46.0 %   MCV 95.4 78.0 - 100.0 fL   MCH 30.1 26.0 - 34.0 pg   MCHC 31.5 30.0 - 36.0 g/dL   RDW 13.4 11.5 - 15.5 %   Platelets 250 150 - 400 K/uL    Comment: Performed at [facility], [place] [address] [place], [place] [address]  Type and screen [place]     Status: None   Collection Time: 07/04/18  1:57 PM  Result Value Ref Range   ABO/RH(D) B POS    Antibody Screen NEG    Sample Expiration      07/07/2018 Performed at [facility], [place] [address] [place], [place] [address]   Protime-INR     Status: Abnormal   Collection Time: 07/04/18  1:57 PM  Result Value Ref Range   Prothrombin Time 20.0 (H) 11.4 - 15.2 seconds   INR 1.72     Comment: Performed at [facility], [place] [address], [place], [place] [address]  POC occult blood, ED     Status: Abnormal   Collection Time: 07/04/18  2:39 PM  Result Value Ref Range   Fecal Occult Bld POSITIVE (A) NEGATIVE   No results found.  Pending Labs [employer] (From admission, onward)    Start     Ordered   Signed and Held  CBC  Now then every 6 hours,   R     Signed and Held          Vitals/Pain Today's Vitals   07/04/18 1251 07/04/18 1335 07/04/18 1400 07/04/18 1430  BP:  135/64 (!) 137/50 (!) 135/59  Pulse:  62 61 (!) 56  Resp:  16 16 16   Temp:      TempSrc:      SpO2:  99% 100% 96%  Weight: 88.9 kg     Height: 5' 10"  (1.778 m)     PainSc: 0-No pain       Isolation Precautions No active isolations  Medications Medications - No data to display  Mobility walks

## 2018-07-05 ENCOUNTER — Observation Stay (HOSPITAL_COMMUNITY): Payer: Medicare Other

## 2018-07-05 DIAGNOSIS — K625 Hemorrhage of anus and rectum: Secondary | ICD-10-CM | POA: Diagnosis not present

## 2018-07-05 DIAGNOSIS — M25561 Pain in right knee: Secondary | ICD-10-CM

## 2018-07-05 DIAGNOSIS — Z471 Aftercare following joint replacement surgery: Secondary | ICD-10-CM | POA: Diagnosis not present

## 2018-07-05 DIAGNOSIS — K644 Residual hemorrhoidal skin tags: Secondary | ICD-10-CM | POA: Diagnosis not present

## 2018-07-05 DIAGNOSIS — K648 Other hemorrhoids: Secondary | ICD-10-CM | POA: Diagnosis not present

## 2018-07-05 DIAGNOSIS — Z96651 Presence of right artificial knee joint: Secondary | ICD-10-CM | POA: Diagnosis not present

## 2018-07-05 DIAGNOSIS — R710 Precipitous drop in hematocrit: Secondary | ICD-10-CM | POA: Diagnosis not present

## 2018-07-05 LAB — CBC
HEMATOCRIT: 32.7 % — AB (ref 36.0–46.0)
HEMATOCRIT: 33.3 % — AB (ref 36.0–46.0)
HEMATOCRIT: 34.6 % — AB (ref 36.0–46.0)
HEMOGLOBIN: 11.1 g/dL — AB (ref 12.0–15.0)
Hemoglobin: 10.7 g/dL — ABNORMAL LOW (ref 12.0–15.0)
Hemoglobin: 10.8 g/dL — ABNORMAL LOW (ref 12.0–15.0)
MCH: 30.8 pg (ref 26.0–34.0)
MCH: 30.5 pg (ref 26.0–34.0)
MCH: 30.7 pg (ref 26.0–34.0)
MCHC: 32.1 g/dL (ref 30.0–36.0)
MCHC: 33 g/dL (ref 30.0–36.0)
MCHC: 32.1 g/dL (ref 30.0–36.0)
MCV: 95.1 fL (ref 78.0–100.0)
MCV: 93.2 fL (ref 78.0–100.0)
MCV: 95.4 fL (ref 78.0–100.0)
PLATELETS: 265 10*3/uL (ref 150–400)
PLATELETS: 246 10*3/uL (ref 150–400)
Platelets: 271 10*3/uL (ref 150–400)
RBC: 3.51 MIL/uL — ABNORMAL LOW (ref 3.87–5.11)
RBC: 3.49 MIL/uL — AB (ref 3.87–5.11)
RBC: 3.64 MIL/uL — AB (ref 3.87–5.11)
RDW: 13.5 % (ref 11.5–15.5)
RDW: 13.5 % (ref 11.5–15.5)
RDW: 13.6 % (ref 11.5–15.5)
WBC: 6.5 10*3/uL (ref 4.0–10.5)
WBC: 5.9 10*3/uL (ref 4.0–10.5)
WBC: 6.4 10*3/uL (ref 4.0–10.5)

## 2018-07-05 MED ORDER — RIVAROXABAN 10 MG PO TABS
10.0000 mg | ORAL_TABLET | Freq: Every day | ORAL | Status: DC
  Administered 2018-07-05: 10 mg via ORAL
  Filled 2018-07-05: qty 1

## 2018-07-05 MED ORDER — PSYLLIUM 95 % PO PACK
1.0000 | PACK | Freq: Two times a day (BID) | ORAL | Status: DC
  Administered 2018-07-05 – 2018-07-06 (×2): 1 via ORAL
  Filled 2018-07-05 (×2): qty 1

## 2018-07-05 MED ORDER — POLYETHYLENE GLYCOL 3350 17 G PO PACK
17.0000 g | PACK | Freq: Two times a day (BID) | ORAL | Status: DC
  Administered 2018-07-05 – 2018-07-06 (×2): 17 g via ORAL
  Filled 2018-07-05 (×2): qty 1

## 2018-07-05 NOTE — Progress Notes (Addendum)
Patient ID: [name], female   DOB: 1941/05/08, 77 y.o.   MRN: [id number]    Progress Note   Subjective    Just got up in chair- Knee hurting a lot  No BM since admit, and no bleeding since admit - not passing any blood from rectum when bears  down to urinate which was what was happening at home  HGB 10 .7 stable   Objective   Vital signs in last 24 hours: Temp:  [98.8 F (37.1 C)-99.7 F (37.6 C)] 98.8 F (37.1 C) (09/21 0450) Pulse Rate:  [56-68] 59 (09/21 0450) Resp:  [16-18] 18 (09/21 0450) BP: (126-161)/(46-64) 128/60 (09/21 0450) SpO2:  [95 %-100 %] 97 % (09/21 0450) Weight:  [88.9 kg] 88.9 kg (09/20 1251) Last BM Date: 07/04/18 General:  AA female in NAD, in chair Heart:  Regular rate and rhythm; no murmurs Lungs: Respirations even and unlabored, lungs CTA bilaterally Abdomen:  Soft, nontender and nondistended. Normal bowel sounds. Extremities:  Without edema. Neurologic:  Alert and oriented,  grossly normal neurologically. Psych:  Cooperative. Normal mood and affect.  Intake/Output from previous day: 09/20 0701 - 09/21 0700 In: 652.3 [P.O.:120; I.V.:532.3] Out: 1400 [Urine:1400]  Lab Results: Recent Labs    07/04/18 1824 07/05/18 0004 07/05/18 0750  WBC 6.6 6.5 5.9  HGB 11.0* 11.1* 10.7*  HCT 34.1* 34.6* 33.3*  PLT 242 271 265         Assessment / Plan:     #1 77 yo female s/p Right knee replacment 06/24/2018 anticoagulated on Xarelto ad mitted with hematochezia.  Interesting no further bleeding since admit , even with straining to urinate which was what she  had experienced at home past few days  Suspect this is hemorrhoidal bleeding /internal  #2 last Colon 2011- diverticulosis , no internal hemorrhoids noted - remote hx adenomatous polyp 2003  Plan;  Will do anoscopy this afternoon per Dr Carlean Purl - if not clearly hemorrhoidal source Flex in am  Xarelto on hold - resume later today if hemorrhoids on anoscopy  Increase Miralax to BID        Contact  [name], P.A.-C               [phone]      Active Problems:   GI bleed   Rectal bleeding   Decreased hemoglobin     LOS: 0 days   [name]  07/05/2018, 10:36 AM   Agree with [name] assessment and plan.  Anoscopy is performed w/ Cheryl Kuba RN present  DRE - NL  Anoscopy Gr 2 inflamed internal and external hemorrhoids all 3 positions No blood but + stigmata of bleeding    I think retry Xarelto Tx hemorrhoids w/ HC suppositories here BUT WHEN GOES HOME RX CREAM 2.5% NOT SUPPOSITORIES AS THEY WILL BE TOO EXPENSIVE Psyllium Reg diet Hgb AM Home tomorrow if ok  Gatha Mayer, MD, [facility] 07/05/2018 3:42 PM Pager [phone]

## 2018-07-05 NOTE — Progress Notes (Signed)
PROGRESS NOTE  [name]:[id number] DOB: 09-26-1941 DOA: 07/04/2018 PCP: Fanny Bien, MD  HPI/Recap of past 24 hours:  [name] is a 77 y.o. female with medical history significant of hypertension, hyperlipidemia, osteoarthritis status post right knee replacement 1 to 2 weeks ago, currently on Xarelto for DVT prophylaxis and undergoing physical therapy, who presents to the hospital with 2 days of bright red blood per rectum.  This started yesterday, tells me that she had to strain and noticed some blood mixed with stool.  The bleeding persisted and today she is bleeding even without having a bowel movement.  She has had to come to the ED.  She denies any abdominal pain, has no nausea or vomiting.  She denies any diarrhea.  She does complain of lightheadedness when standing, but this has been ongoing since her surgery.  She has had no fever or chills, no chest pain, no shortness of breath.  She has been doing fairly well with her knee but still has difficulties and requires assistance to stand up and still walks with a walker.  Admitted for acute GI bleed.   07/05/2018: Patient seen and examined at bedside.  She reports no recurrent bleeding overnight or this morning.  GI plan for anoscopy this afternoon if negative will possibly do a flex in the morning. Reports pain in her right knee.  Portable x-ray right knee revealed right knee arthroplasty in satisfactory position with no fracture or dislocation, small supra patella knee joint effusion.  She had a total right knee replacement done on 06/23/2018.  On Xarelto for DVT prophylaxis.  Assessment/Plan: Active Problems:   GI bleed   Rectal bleeding   Decreased hemoglobin  Acute GI bleed Resolved this morning GI plan for anoscopy this afternoon and if negative flex in the morning Xarelto on hold Defer to GI to resume Xarelto Hemoglobin stable Repeat CBC in the morning MiraLAX twice daily as recommended by GI  Status post right  knee replacement on 06/23/2018 by Dr. Wynelle Link Virtual PT at home since discharge from the hospital On Xarelto for DVT prophylaxis Defer to GI to resume Xarelto Continue home pain medications with p.o. Dilaudid, and Neurontin Bowel regimen in place to avoid opiate-induced constipation  Hypertension Blood pressure is stable  We will resume losartan since she is no longer bleeding  Hyperlipidemia Continue Crestor  Chronic depression Continue Prozac  GERD continue omeprazole   Code Status: Full code  Family Communication: None at bedside  Disposition Plan: Home possibly today or tomorrow 07/06/2018 or when GI signs off.   Consultants:  GI  Procedures:  Anoscope  Antimicrobials:  None  DVT prophylaxis: SCDs   Objective: Vitals:   07/04/18 1630 07/04/18 1748 07/04/18 2059 07/05/18 0450  BP: (!) 151/57 (!) 161/46 (!) 126/54 128/60  Pulse: (!) 58 61 (!) 57 (!) 59  Resp: 16 18 18 18   Temp:  99.7 F (37.6 C) 98.8 F (37.1 C) 98.8 F (37.1 C)  TempSrc:  Oral Oral Oral  SpO2: 100% 98% 95% 97%  Weight:      Height:        Intake/Output Summary (Last 24 hours) at 07/05/2018 1220 Last data filed at 07/05/2018 1000 Gross per 24 hour  Intake 1150.37 ml  Output 1700 ml  Net -549.63 ml   Filed Weights   07/04/18 1251  Weight: 88.9 kg    Exam:  . General: 77 y.o. year-old female well developed well nourished in no acute distress.  Alert and interactive. . Cardiovascular: Regular rate and rhythm with no rubs or gallops.  No thyromegaly or JVD noted.  [name] Respiratory: Clear to auscultation with no wheezes or rales. Good inspiratory effort. . Abdomen: Soft nontender nondistended with normal bowel sounds x4 quadrants. . Musculoskeletal: Trace lower extremity edema. 2/4 pulses in all 4 extremities. . Skin: Right knee with mild swelling post total knee replacement on 06/23/2018. [name] Psychiatry: Mood is appropriate for condition and setting   Data Reviewed: CBC: Recent  Labs  Lab 07/04/18 1357 07/04/18 1824 07/05/18 0004 07/05/18 0750  WBC 6.8 6.6 6.5 5.9  HGB 11.0* 11.0* 11.1* 10.7*  HCT 34.9* 34.1* 34.6* 33.3*  MCV 95.4 95.3 95.1 95.4  PLT 250 242 271 962   Basic Metabolic Panel: Recent Labs  Lab 07/04/18 1357  NA 137  K 4.6  CL 98  CO2 31  GLUCOSE 103*  BUN 21  CREATININE 0.91  CALCIUM 9.1   GFR: Estimated Creatinine Clearance: 64.7 mL/min (by C-G formula based on SCr of 0.91 mg/dL). Liver Function Tests: Recent Labs  Lab 07/04/18 1357  AST 24  ALT 23  ALKPHOS 62  BILITOT 0.8  PROT 7.1  ALBUMIN 3.2*   No results for input(s): LIPASE, AMYLASE in the last 168 hours. No results for input(s): AMMONIA in the last 168 hours. Coagulation Profile: Recent Labs  Lab 07/04/18 1357  INR 1.72   Cardiac Enzymes: No results for input(s): CKTOTAL, CKMB, CKMBINDEX, TROPONINI in the last 168 hours. BNP (last 3 results) No results for input(s): PROBNP in the last 8760 hours. HbA1C: No results for input(s): HGBA1C in the last 72 hours. CBG: No results for input(s): GLUCAP in the last 168 hours. Lipid Profile: No results for input(s): CHOL, HDL, LDLCALC, TRIG, CHOLHDL, LDLDIRECT in the last 72 hours. Thyroid Function Tests: No results for input(s): TSH, T4TOTAL, FREET4, T3FREE, THYROIDAB in the last 72 hours. Anemia Panel: No results for input(s): VITAMINB12, FOLATE, FERRITIN, TIBC, IRON, RETICCTPCT in the last 72 hours. Urine analysis:    Component Value Date/Time   COLORURINE STRAW (A) 06/27/2018 0811   APPEARANCEUR CLEAR 06/27/2018 0811   LABSPEC 1.008 06/27/2018 0811   PHURINE 9.0 (H) 06/27/2018 0811   GLUCOSEU NEGATIVE 06/27/2018 0811   HGBUR SMALL (A) 06/27/2018 0811   BILIRUBINUR NEGATIVE 06/27/2018 0811   KETONESUR NEGATIVE 06/27/2018 0811   PROTEINUR NEGATIVE 06/27/2018 0811   UROBILINOGEN 0.2 06/09/2015 1931   NITRITE NEGATIVE 06/27/2018 0811   LEUKOCYTESUR NEGATIVE 06/27/2018 0811   Sepsis  Labs: @LABRCNTIP (procalcitonin:4,lacticidven:4)  )No results found for this or any previous visit (from the past 240 hour(s)).    Studies: Dg Knee Right Port  Result Date: 07/05/2018 CLINICAL DATA:  Right knee pain, status post knee replacement on 06/23/2018 EXAM: PORTABLE RIGHT KNEE - 1-2 VIEW COMPARISON:  08/11/2015 FINDINGS: Right knee arthroplasty in satisfactory position. No fracture or dislocation is seen. Small suprapatellar knee joint effusion. IMPRESSION: Right knee arthroplasty, without evidence of complication. Small suprapatellar knee joint effusion. Electronically Signed   By: Julian Hy M.D.   On: 07/05/2018 09:00    Scheduled Meds: . FLUoxetine  20 mg Oral Daily  . gabapentin  300 mg Oral TID   Followed by  . [START ON 07/12/2018] gabapentin  300 mg Oral BID   Followed by  . [START ON 07/27/2018] gabapentin  300 mg Oral Daily  . hydrocortisone  25 mg Rectal BID  . loratadine  10 mg Oral Daily  . pantoprazole  40 mg Oral  Daily  . polyethylene glycol  17 g Oral BID  . rosuvastatin  40 mg Oral Daily    Continuous Infusions: . sodium chloride Stopped (07/05/18 0903)     LOS: 0 days     Kayleen Memos, MD [facility] Pager [phone]  If 7PM-7AM, please contact night-coverage [email or web address] Password [email or web address] 07/05/2018, 12:20 PM

## 2018-07-05 NOTE — Plan of Care (Signed)
Pt stable with no needs at time of assessment. Pt medicated for pain. No changes needed to care plans. Rn will continue to monitor.

## 2018-07-05 NOTE — Plan of Care (Signed)
Pt resting in bed this morning with complaints of pain to right knee. Right knee swollen and warm with old dressing in place. Will continue to monitor.

## 2018-07-05 NOTE — Progress Notes (Signed)
Physical Therapy Evaluation Patient Details Name: [name] MRN: [id number] DOB: [birth year] Today's Date: 07/05/2018   History of Present Illness  Pt is 77 YO female admitted with rectal bleed and s/p R TKR on 06/23/18. PMH includes HTN, OA, RA, fibromyalgia, glaucoma, OP, sleep apnea, depression, hearing loss, bradycardia. Past surgical history includes cardiac cath, gastric resection, L TKR 2018.  Clinical Impression  Pt admitted as above and presenting with functional mobility limitations 2* decreased R LE strength/ROM and residual post op pain.  Pt should progress to dc home with family assist.  Pt has virtual PT at home as follow up for recent TKR.    Follow Up Recommendations Supervision for mobility/OOB;Follow surgeon's recommendation for DC plan and follow-up therapies(Pt home with virtual therapy)    Equipment Recommendations  None recommended by PT    Recommendations for Other Services       Precautions / Restrictions Precautions Precautions: Fall;Knee Restrictions Weight Bearing Restrictions: No RLE Weight Bearing: Weight bearing as tolerated      Mobility  Bed Mobility Overal bed mobility: Needs Assistance Bed Mobility: Sit to Supine       Sit to supine: Min guard   General bed mobility comments: min cues for sequence  Transfers Overall transfer level: Needs assistance Equipment used: Rolling walker (2 wheeled) Transfers: Sit to/from Stand Sit to Stand: Supervision         General transfer comment: min cues for use of UEs to self assist  Ambulation/Gait Ambulation/Gait assistance: Min guard;Supervision Gait Distance (Feet): 250 Feet Assistive device: Rolling walker (2 wheeled) Gait Pattern/deviations: Step-to pattern;Step-through pattern;Decreased step length - right;Decreased step length - left;Shuffle;Trunk flexed Gait velocity: decreased   General Gait Details: cues for posture, position from RW and initial sequence  Stairs             Wheelchair Mobility    Modified Rankin (Stroke Patients Only)       Balance Overall balance assessment: Mild deficits observed, not formally tested                                           Pertinent Vitals/Pain Pain Assessment: 0-10 Pain Score: 3  Pain Location: R knee Pain Descriptors / Indicators: Aching;Sore Pain Intervention(s): Limited activity within patient's tolerance;Monitored during session;Premedicated before session;Ice applied    Home Living Family/patient expects to be discharged to:: Private residence Living Arrangements: Spouse/significant other Available Help at Discharge: Family;Available PRN/intermittently Type of Home: House Home Access: Stairs to enter Entrance Stairs-Rails: None Entrance Stairs-Number of Steps: 2 Home Layout: One level Home Equipment: Walker - 2 wheels;Bedside commode;Cane - single point;Tub bench Additional Comments: daughter in before she works and after. Husband has dementia, can't assist    Prior Function Level of Independence: Independent with assistive device(s)   Gait / Transfers Assistance Needed: using Rw since TKR           Hand Dominance   Dominant Hand: Right    Extremity/Trunk Assessment   Upper Extremity Assessment Upper Extremity Assessment: Overall WFL for tasks assessed    Lower Extremity Assessment Lower Extremity Assessment: RLE deficits/detail RLE Deficits / Details: 3/5 quads with IND SLR and AAROM at knee -8 - 90 RLE Sensation: WNL       Communication   Communication: No difficulties  Cognition Arousal/Alertness: Awake/alert Behavior During Therapy: WFL for tasks assessed/performed Overall Cognitive Status: Within Functional Limits for  tasks assessed                                 General Comments: pt required positive encouragement      General Comments      Exercises Total Joint Exercises Ankle Circles/Pumps: AROM;Both;20 reps;Supine Quad Sets:  AROM;10 reps;Right Heel Slides: AAROM;Right;20 reps;Supine Straight Leg Raises: Right;AAROM;AROM;20 reps;Supine   Assessment/Plan    PT Assessment Patient needs continued PT services  PT Problem List Decreased strength;Pain;Decreased range of motion;Decreased activity tolerance;Decreased knowledge of use of DME;Decreased balance;Decreased mobility       PT Treatment Interventions DME instruction;Therapeutic activities;Gait training;Therapeutic exercise;Patient/family education;Balance training;Stair training;Functional mobility training    PT Goals (Current goals can be found in the Care Plan section)  Acute Rehab PT Goals Patient Stated Goal: be independent PT Goal Formulation: With patient Time For Goal Achievement: 07/19/18 Potential to Achieve Goals: Good    Frequency Min 4X/week   Barriers to discharge        Co-evaluation               AM-PAC PT "6 Clicks" Daily Activity  Outcome Measure Difficulty turning over in bed (including adjusting bedclothes, sheets and blankets)?: A Lot Difficulty moving from lying on back to sitting on the side of the bed? : A Lot Difficulty sitting down on and standing up from a chair with arms (e.g., wheelchair, bedside commode, etc,.)?: A Little Help needed moving to and from a bed to chair (including a wheelchair)?: A Little Help needed walking in hospital room?: A Little Help needed climbing 3-5 steps with a railing? : A Little 6 Click Score: 16    End of Session Equipment Utilized During Treatment: Gait belt Activity Tolerance: Patient tolerated treatment well Patient left: in chair;with call bell/phone within reach;with family/visitor present Nurse Communication: Mobility status PT Visit Diagnosis: Other abnormalities of gait and mobility (R26.89);Difficulty in walking, not elsewhere classified (R26.2)    Time: 1410-1447 PT Time Calculation (min) (ACUTE ONLY): 37 min   Charges:   PT Evaluation $PT Eval Low Complexity: 1  Low PT Treatments $Therapeutic Exercise: 8-22 mins        Debe Coder PT Acute Rehabilitation Services Pager [phone] Office [phone]   Marino Rogerson 07/05/2018, 4:16 PM

## 2018-07-05 NOTE — Progress Notes (Signed)
ANTICOAGULATION CONSULT NOTE - Initial Consult  Pharmacy Consult for rivaroxaban Indication: VTE prophylaxis  Allergies  Allergen Reactions  . Adalat [Nifedipine] Other (See Comments)    Unknown  . Hydrocodone Itching and Nausea And Vomiting  . Meperidine Hcl Other (See Comments)    Demerol causes hallucinations  . Naproxen Nausea And Vomiting    Patient Measurements: Height: 5\' 10"  (177.8 cm) Weight: 196 lb (88.9 kg) IBW/kg (Calculated) : 68.5   Vital Signs: Temp: 99.4 F (37.4 C) (09/21 1459) Temp Source: Oral (09/21 1459) BP: 141/55 (09/21 1459) Pulse Rate: 72 (09/21 1459)  Labs: Recent Labs    07/04/18 1357  07/05/18 0004 07/05/18 0750 07/05/18 1221  HGB 11.0*   < > 11.1* 10.7* 10.8*  HCT 34.9*   < > 34.6* 33.3* 32.7*  PLT 250   < > 271 265 246  LABPROT 20.0*  --   --   --   --   INR 1.72  --   --   --   --   CREATININE 0.91  --   --   --   --    < > = values in this interval not displayed.    Estimated Creatinine Clearance: 64.7 mL/min (by C-G formula based on SCr of 0.91 mg/dL).   Medical History: Past Medical History:  Diagnosis Date  . Anal or rectal pain    sometimes  . Anemia    hx of during pregnancy  . Arthritis   . Asthma    hx of  . Bradycardia    " I KNOW I HAVE BRADYCARDIA ESPECIALLY WHEN I SLEEP"   . Chronic back pain   . Degenerative joint disease    osteo  . Depression   . Diverticulosis 2003  . Dysrhythmia    hx of  due to eye drop and also low heart rate 40's per pt. Dr. Gwenlyn Found follows  . Elevated total protein   . Esophageal dysmotility   . Fibromyalgia   . GERD (gastroesophageal reflux disease)    subsequent Nissen Fundoplication  . Glaucoma   . Hearing loss   . Hemorrhoids   . Hiatal hernia 11/08/09  . Hx of adenomatous colonic polyps 07/02/02  . Hypercalcemia   . Hyperlipidemia   . Hyperlipidemia   . Hypertension   . Nausea   . Osteoporosis   . PONV (postoperative nausea and vomiting)   . Rectal bleeding    from  hemorrhoids.    . Sleep apnea    DOES USE CPAP   . Thrombocytopenia ([place])   . Varicose veins of left lower extremity    Assessment: Pharmacy consulted to resume rivaroxaban for DVT ppx. Patient is s/p right knee replacement on 06/24/18 and was prescribed rivaroxaban 10 mg PO daily post-op for DVT prophylaxis.  Patient was admitted with complaints of rectal bleeding - GI is following and has evaluated pt.  Today, 07/05/18  Hgb 10.8, Plt 246 stable and WNL  Rectal bleeding resolved per notes  GI following: Anoscopy done this afternoon - bleeding believed to be associated with hemorrhoids. Pharmacy consulted to resume rivaroxaban.   Plan:   Rivaroxaban 10 mg PO daily  Monitor for any signs/symptoms of bleeding  Monitor Hgb/Plt  Lenis Noon, PharmD Clinical Pharmacist 07/05/2018,4:37 PM

## 2018-07-06 DIAGNOSIS — K648 Other hemorrhoids: Principal | ICD-10-CM

## 2018-07-06 DIAGNOSIS — R710 Precipitous drop in hematocrit: Secondary | ICD-10-CM

## 2018-07-06 DIAGNOSIS — K625 Hemorrhage of anus and rectum: Secondary | ICD-10-CM | POA: Diagnosis not present

## 2018-07-06 DIAGNOSIS — K644 Residual hemorrhoidal skin tags: Secondary | ICD-10-CM

## 2018-07-06 LAB — CBC
HCT: 34.1 % — ABNORMAL LOW (ref 36.0–46.0)
Hemoglobin: 10.9 g/dL — ABNORMAL LOW (ref 12.0–15.0)
MCH: 30.6 pg (ref 26.0–34.0)
MCHC: 32 g/dL (ref 30.0–36.0)
MCV: 95.8 fL (ref 78.0–100.0)
PLATELETS: 280 10*3/uL (ref 150–400)
RBC: 3.56 MIL/uL — AB (ref 3.87–5.11)
RDW: 13.6 % (ref 11.5–15.5)
WBC: 6.2 10*3/uL (ref 4.0–10.5)

## 2018-07-06 MED ORDER — HYDROCORTISONE 2.5 % RE CREA: TOPICAL_CREAM | RECTAL | 0 refills | Status: AC

## 2018-07-06 MED ORDER — PSYLLIUM 95 % PO PACK: 1.0000 | PACK | Freq: Two times a day (BID) | ORAL | 0 refills | Status: DC

## 2018-07-06 MED ORDER — POLYETHYLENE GLYCOL 3350 17 G PO PACK: 17.0000 g | PACK | Freq: Two times a day (BID) | ORAL | 0 refills | Status: DC

## 2018-07-06 MED ORDER — PHENYLEPHRINE IN HARD FAT 0.25 % RE SUPP: 1.0000 | Freq: Every day | RECTAL | 0 refills | Status: DC

## 2018-07-06 NOTE — Care Management Note (Signed)
Case Management Note  Patient Details  Name: [name] MRN: [id number] Date of Birth: 06-21-1941  Subjective/Objective:    Internal/external bleeding hemorrhoids, GI bleed, Rectal bleeding                Action/Plan: NCM spoke to pt and states she wants to continue Virtual Program for PT at home. She has RW and 3n1 bedside commode at home. States she has follow up with surgeon and will discuss OPPT. Pt declines HHPT or SNF rehab.   Expected Discharge Date:  07/06/18               Expected Discharge Plan:  Home/Self Care  In-House Referral:  NA  Discharge planning Services  CM Consult  Post Acute Care Choice:  NA Choice offered to:  NA  DME Arranged:  N/A DME Agency:  NA  HH Arranged:  NA HH Agency:  NA  Status of Service:  Completed, signed off  If discussed at [place] of Stay Meetings, dates discussed:    Additional Comments:  Erenest Rasher, RN 07/06/2018, 1:12 PM
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
## 2018-07-06 NOTE — Discharge Summary (Signed)
Discharge Summary  [name]:[id number] DOB: 07-28-1941  PCP: Cheryl Bien, MD  Admit date: 07/04/2018 Discharge date: 07/06/2018  Time spent: 25 minutes  Recommendations for Outpatient Follow-up:  1. Follow-up with orthopedic surgery 2. Follow-up with GI 3. Follow-up with your PCP 4. Take your medications as prescribed 5. Continue physical therapy 6. Fall precautions  GI RECOMMENDATIONS: #1 77 yo AA female with rectal bleeding secondary to internal hemorrhoids in setting of short term anticoagulation with Xarelto post recent knee replacement She has been stable, no active hemorrhage but sees a little blood with Bm's still   Plan; Discharge today   Continue Xarelto x one more week as per Ortho Miralax qd- BID Prep H supp at bedtime   Hydrocortisone cream 2.5 %  BID in to anus - needs rx  GI will see in office as needed - she knows to call us for increase in bleeding etc, and if bleeding continues once recovered from knee and off Xarelto can having banding at office.    Discharge Diagnoses:  Active Hospital Problems   Diagnosis Date Noted  . GI bleed 07/04/2018  . Rectal bleeding   . Decreased hemoglobin   . Internal and external bleeding hemorrhoids 12/14/2009    Resolved Hospital Problems  No resolved problems to display.    Discharge Condition: Stable  Diet recommendation: Resume previous diet  Vitals:   07/05/18 2127 07/06/18 0604  BP: (!) 132/54 (!) 161/88  Pulse: 61 (!) 55  Resp: 18 18  Temp: 99.1 F (37.3 C) (!) 97.3 F (36.3 C)  SpO2: 94% 99%    History of present illness:  [name] a 77 y.o.femalewith medical history significant ofhypertension, hyperlipidemia, osteoarthritis status post right knee replacement 1 to 2 weeks ago, currently on Xarelto for DVT prophylaxis and undergoing physical therapy, who presents to the hospital with 2 days of bright red blood per rectum. This started yesterday, tells me that she had to strain  and noticed some blood mixed with stool. The bleeding persisted and today she is bleeding even without having a bowel movement. She has had to come to the ED. She denies any abdominal pain, has no nausea or vomiting. She denies any diarrhea. She does complain of lightheadedness when standing, but this has been ongoing since her surgery. She has had no fever or chills, no chest pain, no shortness of breath. She has been doing fairly well with her knee but still has difficulties and requires assistance to stand up and still walks with a walker.  Admitted for acute lower GI bleed.  07/05/2018: Anoscopy done with evidence for hemorrhoids.  Reports pain in her right knee.  Portable x-ray right knee revealed right knee arthroplasty in satisfactory position with no fracture or dislocation, small supra patella knee joint effusion.  She had a total right knee replacement done on 06/23/2018.  On Xarelto for DVT prophylaxis.   07/06/2018: Patient seen and examined with her husband at her bedside.  Reports about a small amount of blood from rectal bleed this morning.  Hemoglobin remained stable and trending up from 10.1 to 10.9.  Restarted on Xarelto on 07/05/2018.  On the day of discharge, the patient was hemodynamically stable.  She will need to follow-up with orthopedic surgery, GI, and PCP posthospitalization.  Hospital Course:  Active Problems:   Internal and external bleeding hemorrhoids   GI bleed   Rectal bleeding   Decreased hemoglobin  Acute lower GI bleed Hemoglobin stable Restarted on Xarelto  on 07/05/2018 for DVT prophylaxis post right knee replacement Follow-up with orthopedic surgery Follow-up with your PCP Follow-up with GI as needed.  Status post right knee replacement on 06/23/2018 by Dr. Wynelle Bates Virtual PT at home since discharge from the hospital On Xarelto for DVT prophylaxis Continue home pain medications with p.o. Dilaudid, and Neurontin Bowel regimen in place to avoid  opiate-induced constipation  Hypertension Blood pressure is stable  We will resume losartan since she is no longer bleeding  Hyperlipidemia Continue Crestor  Chronic depression Continue Prozac  GERD continue omeprazole   Code Status: Full code    Consultants:  GI  Procedures:  Anoscope on 07/05/18  Antimicrobials:  None    Discharge Exam: BP (!) 161/88 (BP Location: Right Arm)   Pulse (!) 55   Temp (!) 97.3 F (36.3 C) (Oral)   Resp 18   Ht 5\' 10"  (1.778 m)   Wt 88.9 kg   SpO2 99%   BMI 28.12 kg/m  . General: 77 y.o. year-old female well developed well nourished in no acute distress.  Alert and oriented x3. . Cardiovascular: Regular rate and rhythm with no rubs or gallops.  No thyromegaly or JVD noted.   [name] Respiratory: Clear to auscultation with no wheezes or rales. Good inspiratory effort. . Abdomen: Soft nontender nondistended with normal bowel sounds x4 quadrants. . Psychiatry: Mood is appropriate for condition and setting  Discharge Instructions You were cared for by a hospitalist during your hospital stay. If you have any questions about your discharge medications or the care you received while you were in the hospital after you are discharged, you can call the unit and asked to speak with the hospitalist on call if the hospitalist that took care of you is not available. Once you are discharged, your primary care physician will handle any further medical issues. Please note that NO REFILLS for any discharge medications will be authorized once you are discharged, as it is imperative that you return to your primary care physician (or establish a relationship with a primary care physician if you do not have one) for your aftercare needs so that they can reassess your need for medications and monitor your lab values.   Allergies as of 07/06/2018      Reactions   Adalat [nifedipine] Other (See Comments)   Unknown   Hydrocodone Itching, Nausea And  Vomiting   Meperidine Hcl Other (See Comments)   Demerol causes hallucinations   Naproxen Nausea And Vomiting      Medication List    TAKE these medications   Albuterol Sulfate 108 (90 Base) MCG/ACT Aepb Inhale 2 puffs into the lungs every 6 (six) hours as needed. What changed:  reasons to take this   bimatoprost 0.01 % Soln Commonly known as:  LUMIGAN Place 1 drop into both eyes at bedtime.   cetirizine 10 MG tablet Commonly known as:  ZYRTEC Take 10 mg by mouth at bedtime.   FLUoxetine 20 MG tablet Commonly known as:  PROZAC Take 20 mg by mouth daily.   gabapentin 300 MG capsule Commonly known as:  NEURONTIN Take 1 capsule (300 mg total) by mouth 3 (three) times daily. Gabapentin 300 mg Protocol Take a 300 mg capsule three times a day for two weeks following surgery. Then take a 300 mg capsule two times a day for two weeks.  Then take a 300 mg capsule once a day for two weeks.  Then discontinue the Gabapentin.   hydrocortisone 2.5 % rectal cream  Commonly known as:  ANUSOL-HC Apply rectally 2 times daily   HYDROmorphone 2 MG tablet Commonly known as:  DILAUDID Take 1-2 tablets (2-4 mg total) by mouth every 6 (six) hours as needed for severe pain.   losartan 50 MG tablet Commonly known as:  COZAAR Take 50 mg by mouth daily.   methocarbamol 500 MG tablet Commonly known as:  ROBAXIN Take 1 tablet (500 mg total) by mouth every 6 (six) hours as needed for muscle spasms.   montelukast 10 MG tablet Commonly known as:  SINGULAIR Take 10 mg by mouth at bedtime.   nystatin ointment Commonly known as:  MYCOSTATIN Apply 1 application topically 2 (two) times daily as needed (for rash).   nystatin powder Commonly known as:  MYCOSTATIN/NYSTOP Apply 1 g topically daily as needed (for rash).   ondansetron 4 MG tablet Commonly known as:  ZOFRAN Take 1 tablet (4 mg total) by mouth every 8 (eight) hours as needed for nausea or vomiting.   phenylephrine 0.25 %  suppository Commonly known as:  (USE for PREPARATION-H) Place 1 suppository rectally at bedtime.   polyethylene glycol packet Commonly known as:  MIRALAX / GLYCOLAX Take 17 g by mouth 2 (two) times daily.   PRILOSEC 40 MG capsule Generic drug:  omeprazole Take 40 mg by mouth daily.   psyllium 95 % Pack Commonly known as:  HYDROCIL/METAMUCIL Take 1 packet by mouth 2 (two) times daily.   ranitidine 300 MG tablet Commonly known as:  ZANTAC Take 1 tablet (300 mg total) by mouth at bedtime.   rivaroxaban 10 MG Tabs tablet Commonly known as:  XARELTO Take 1 tablet (10 mg total) by mouth daily with breakfast for 17 days. Then take one 81 mg aspirin once a day for three weeks. Then discontinue aspirin.   rosuvastatin 40 MG tablet Commonly known as:  CRESTOR Take 40 mg by mouth daily.   traMADol 50 MG tablet Commonly known as:  ULTRAM Take 1-2 tablets (50-100 mg total) by mouth every 6 (six) hours as needed for moderate pain.      Allergies  Allergen Reactions  . Adalat [Nifedipine] Other (See Comments)    Unknown  . Hydrocodone Itching and Nausea And Vomiting  . Meperidine Hcl Other (See Comments)    Demerol causes hallucinations  . Naproxen Nausea And Vomiting   Follow-up Information    Cheryl Bien, MD. Call in 1 day(s).   Specialty:  Family Medicine Why:  Please call call for a post hospital follow-up appointment Contact information: [address] [place] [address] [phone]        Lorretta Harp, MD .   Specialties:  Cardiology, Radiology Contact information: [address] [place] [address] [phone]        Gatha Mayer, MD. Call in 1 day(s).   Specialty:  Gastroenterology Why:  Please call for a post hospital follow-up appointment as needed. Contact information: [address] [place] [address] [phone]            The results of significant diagnostics from this hospitalization  (including imaging, microbiology, ancillary and laboratory) are listed below for reference.    Significant Diagnostic Studies: Dg Chest Port 1 View  Result Date: 06/26/2018 CLINICAL DATA:  Low oxygen saturation EXAM: PORTABLE CHEST 1 VIEW COMPARISON:  Chest x-ray of 12/21/2016 FINDINGS: No active infiltrate or effusion is seen. Mediastinal and hilar contours are stable and mild cardiomegaly is stable. No acute bony  abnormality is seen. IMPRESSION: No active disease. Electronically Signed   By: Ivar Drape M.D.   On: 06/26/2018 10:18   Dg Knee Right Port  Result Date: 07/05/2018 CLINICAL DATA:  Right knee pain, status post knee replacement on 06/23/2018 EXAM: PORTABLE RIGHT KNEE - 1-2 VIEW COMPARISON:  08/11/2015 FINDINGS: Right knee arthroplasty in satisfactory position. No fracture or dislocation is seen. Small suprapatellar knee joint effusion. IMPRESSION: Right knee arthroplasty, without evidence of complication. Small suprapatellar knee joint effusion. Electronically Signed   By: Julian Hy M.D.   On: 07/05/2018 09:00    Microbiology: No results found for this or any previous visit (from the past 240 hour(s)).   Labs: Basic Metabolic Panel: Recent Labs  Lab 07/04/18 1357  NA 137  K 4.6  CL 98  CO2 31  GLUCOSE 103*  BUN 21  CREATININE 0.91  CALCIUM 9.1   Liver Function Tests: Recent Labs  Lab 07/04/18 1357  AST 24  ALT 23  ALKPHOS 62  BILITOT 0.8  PROT 7.1  ALBUMIN 3.2*   No results for input(s): LIPASE, AMYLASE in the last 168 hours. No results for input(s): AMMONIA in the last 168 hours. CBC: Recent Labs  Lab 07/04/18 1824 07/05/18 0004 07/05/18 0750 07/05/18 1221 07/06/18 0442  WBC 6.6 6.5 5.9 6.4 6.2  HGB 11.0* 11.1* 10.7* 10.8* 10.9*  HCT 34.1* 34.6* 33.3* 32.7* 34.1*  MCV 95.3 95.1 95.4 93.2 95.8  PLT 242 271 265 246 280   Cardiac Enzymes: No results for input(s): CKTOTAL, CKMB, CKMBINDEX, TROPONINI in the last 168 hours. BNP: BNP (last 3  results) No results for input(s): BNP in the last 8760 hours.  ProBNP (last 3 results) No results for input(s): PROBNP in the last 8760 hours.  CBG: No results for input(s): GLUCAP in the last 168 hours.     Signed:  Kayleen Memos, MD [facility] 07/06/2018, 11:48 AM

## 2018-07-06 NOTE — Progress Notes (Addendum)
Patient ID: [name], female   DOB: 1940/12/08, 77 y.o.   MRN: [id number]    Progress Note   Subjective    Feels pretty good , had a little rectal bleeding with Bm HGB  Stable 10.9   Objective   Vital signs in last 24 hours: Temp:  [97.3 F (36.3 C)-99.4 F (37.4 C)] 97.3 F (36.3 C) (09/22 0604) Pulse Rate:  [55-72] 55 (09/22 0604) Resp:  [16-18] 18 (09/22 0604) BP: (132-161)/(54-88) 161/88 (09/22 0604) SpO2:  [94 %-99 %] 99 % (09/22 0604) Last BM Date: 07/02/18 General:   AA female in NAD Heart:  Regular rate and rhythm; no murmurs Lungs: Respirations even and unlabored, lungs CTA bilaterally Abdomen:  Soft, nontender and nondistended. Normal bowel sounds. Extremities:  Without edema. Neurologic:  Alert and oriented,  grossly normal neurologically. Psych:  Cooperative. Normal mood and affect.   Lab Results: Recent Labs    07/05/18 0750 07/05/18 1221 07/06/18 0442  WBC 5.9 6.4 6.2  HGB 10.7* 10.8* 10.9*  HCT 33.3* 32.7* 34.1*  PLT 265 246 280       Assessment / Plan:     #1 77 yo AA female with rectal bleeding secondary to internal hemorrhoids in setting of short term anticoagulation with Xarelto post recent knee replacement  She has been stable, no active hemorrhage but sees a little blood with Bm's still    Plan; Discharge today   Continue Xarelto x one more week as per Ortho Miralax qd- BID Prep H supp at bedtime   Hydrocortisone cream 2.5 %  BID in to anus - needs rx  GI will see in office as needed - she knows to call us for increase in bleeding etc, and if bleeding continues once recovered from knee and off Xarelto can having banding at office     Contact  Amy Shawnee, P.A.-C               [phone]      LOS: 0 days   [name]  07/06/2018, 9:38 AM     [facility] Attending   I have taken an interval history, reviewed the chart and examined the patient. I agree with the Advanced Practitioner's note, impression and  recommendations.   Plan discussed with Dr. [place] Crane also.  Gatha Mayer, MD, [facility] 07/06/2018 10:48 AM Pager [phone]

## 2018-07-06 NOTE — Progress Notes (Signed)
Physical Therapy Treatment Patient Details Name: [name] MRN: [id number] DOB: 1941-08-02 Today's Date: 07/06/2018    History of Present Illness Pt is 77 YO female admitted with rectal bleed and s/p R TKR on 06/23/18. PMH includes HTN, OA, RA, fibromyalgia, glaucoma, OP, sleep apnea, depression, hearing loss, bradycardia. Past surgical history includes cardiac cath, gastric resection, L TKR 2018.    PT Comments    Pt progressing well with mobility, she ambulated 250' with RW and performed TKA exercises with good technique. From PT standpoint, she is ready to DC home.   Follow Up Recommendations  Supervision for mobility/OOB;Follow surgeon's recommendation for DC plan and follow-up therapies(Pt home with virtual therapy)     Equipment Recommendations  None recommended by PT    Recommendations for Other Services       Precautions / Restrictions Precautions Precautions: Fall;Knee Precaution Booklet Issued: Yes (comment) Precaution Comments: reviewed no pillow under knee Restrictions Weight Bearing Restrictions: No RLE Weight Bearing: Weight bearing as tolerated    Mobility  Bed Mobility Overal bed mobility: Modified Independent Bed Mobility: Supine to Sit     Supine to sit: HOB elevated;Modified independent (Device/Increase time)     General bed mobility comments: HOB up  Transfers Overall transfer level: Needs assistance Equipment used: Rolling walker (2 wheeled) Transfers: Sit to/from Stand Sit to Stand: Supervision         General transfer comment: min cues for use of UEs to self assist  Ambulation/Gait Ambulation/Gait assistance: Supervision Gait Distance (Feet): 250 Feet Assistive device: Rolling walker (2 wheeled) Gait Pattern/deviations: Step-to pattern;Step-through pattern;Decreased step length - right;Decreased step length - left;Trunk flexed Gait velocity: decreased   General Gait Details: good sequencing, no loss of balance, step to then  progressed to step through   [employer]    Modified Rankin (Stroke Patients Only)       Balance Overall balance assessment: Modified Independent             Standing balance comment: relies of RW for UE support during standing.                             Cognition Arousal/Alertness: Awake/alert Behavior During Therapy: WFL for tasks assessed/performed Overall Cognitive Status: Within Functional Limits for tasks assessed                                        Exercises Total Joint Exercises Ankle Circles/Pumps: AROM;Both;10 reps Quad Sets: AROM;10 reps;Both Short Arc Quad: 10 reps;Right;AROM;Supine Heel Slides: AAROM;10 reps;Right;Supine Straight Leg Raises: AAROM;Right;5 reps;Supine Long Arc Quad: AROM;Right;10 reps;Seated Knee Flexion: AAROM;AROM;Right;5 reps;Seated Goniometric ROM: AAROM knee flexion ~95*    General Comments        Pertinent Vitals/Pain Pain Score: 6  Pain Location: R knee Pain Descriptors / Indicators: Aching;Sore Pain Intervention(s): Limited activity within patient's tolerance;Monitored during session;Patient requesting pain meds-RN notified;Ice applied    Home Living                      Prior Function            PT Goals (current goals can now be found in the care plan section) Acute Rehab PT Goals Patient Stated Goal: be independent PT Goal Formulation: With patient Time For  Goal Achievement: 07/19/18 Potential to Achieve Goals: Good Progress towards PT goals: Progressing toward goals    Frequency    Min 4X/week      PT Plan      Co-evaluation              AM-PAC PT "6 Clicks" Daily Activity  Outcome Measure  Difficulty turning over in bed (including adjusting bedclothes, sheets and blankets)?: A Little Difficulty moving from lying on back to sitting on the side of the bed? : A Little Difficulty sitting down on and standing up from a  chair with arms (e.g., wheelchair, bedside commode, etc,.)?: A Little Help needed moving to and from a bed to chair (including a wheelchair)?: A Little Help needed walking in hospital room?: A Little Help needed climbing 3-5 steps with a railing? : A Little 6 Click Score: 18    End of Session Equipment Utilized During Treatment: Gait belt Activity Tolerance: Patient tolerated treatment well Patient left: in chair;with call bell/phone within reach;with family/visitor present Nurse Communication: Mobility status PT Visit Diagnosis: Other abnormalities of gait and mobility (R26.89);Difficulty in walking, not elsewhere classified (R26.2)     Time: 1357-1430 PT Time Calculation (min) (ACUTE ONLY): 33 min  Charges:  $Gait Training: 8-22 mins $Therapeutic Exercise: 8-22 mins                     Blondell Reveal Kistler PT 07/06/2018  Acute Rehabilitation Services Pager [phone] Office [phone]

## 2018-07-06 NOTE — Plan of Care (Addendum)
IV removed for discharge. When trying to review discharge instructions with patient and spouse, patient endorses fear of going home. States she would like me to contact physician about not discharging today. Will notify physician on call.
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
## 2018-07-06 NOTE — Discharge Instructions (Signed)
Gastrointestinal Bleeding °Gastrointestinal bleeding is bleeding somewhere along the path food travels through the body (digestive tract). This path is anywhere between the mouth and the opening of the butt (anus). You may have blood in your poop (stools) or have black poop. If you throw up (vomit), there may be blood in it. °This condition can be mild, serious, or even life-threatening. If you have a lot of bleeding, you may need to stay in the hospital. °Follow these instructions at home: °· Take over-the-counter and prescription medicines only as told by your doctor. °· Eat foods that have a lot of fiber in them. These foods include whole grains, fruits, and vegetables. You can also try eating 1-3 prunes each day. °· Drink enough fluid to keep your pee (urine) clear or pale yellow. °· Keep all follow-up visits as told by your doctor. This is important. °Contact a doctor if: °· Your symptoms do not get better. °Get help right away if: °· Your bleeding gets worse. °· You feel dizzy or you pass out (faint). °· You feel weak. °· You have very bad cramps in your back or belly (abdomen). °· You pass large clumps of blood (clots) in your poop. °· Your symptoms are getting worse. °This information is not intended to replace advice given to you by your health care provider. Make sure you discuss any questions you have with your health care provider. °Document Released: 07/10/2008 Document Revised: 03/08/2016 Document Reviewed: 03/21/2015 °Elsevier Interactive Patient Education © 2018 [employer] ° ° °Rectal Bleeding °Rectal bleeding is when blood comes out of the opening of the butt (anus). People with this kind of bleeding may notice bright red blood in their underwear or in the toilet after they poop (have a bowel movement). They may also have dark red or black poop (stool). Rectal bleeding is often a sign that something is wrong. It needs to be checked by a doctor. °Follow these instructions at home: °Watch for any  changes in your condition. Take these actions to help with bleeding and discomfort: °· Eat a diet that is high in fiber. This will keep your poop soft so it is easier for you to poop without pushing too hard. Ask your doctor to tell you what foods and drinks are high in fiber. °· Drink enough fluid to keep your pee (urine) clear or pale yellow. This also helps keep your poop soft. °· Try taking a warm bath. This may help with pain. °· Keep all follow-up visits as told by your doctor. This is important. ° °Get help right away if: °· You have new bleeding. °· You have more bleeding than before. °· You have black or dark red poop. °· You throw up (vomit) blood or something that looks like coffee grounds. °· You have pain or tenderness in your belly (abdomen). °· You have a fever. °· You feel weak. °· You feel sick to your stomach (nauseous). °· You pass out (faint). °· You have very bad pain in your butt. °· You cannot poop. °This information is not intended to replace advice given to you by your health care provider. Make sure you discuss any questions you have with your health care provider. °Document Released: 06/13/2011 Document Revised: 03/08/2016 Document Reviewed: 11/27/2015 °Elsevier Interactive Patient Education © 2018 Elsevier Inc. ° °

## 2018-07-18 DIAGNOSIS — K648 Other hemorrhoids: Secondary | ICD-10-CM | POA: Diagnosis not present

## 2018-07-18 DIAGNOSIS — I10 Essential (primary) hypertension: Secondary | ICD-10-CM | POA: Diagnosis not present

## 2018-07-18 DIAGNOSIS — R5383 Other fatigue: Secondary | ICD-10-CM | POA: Diagnosis not present

## 2018-07-18 DIAGNOSIS — D649 Anemia, unspecified: Secondary | ICD-10-CM | POA: Diagnosis not present

## 2018-07-18 DIAGNOSIS — F331 Major depressive disorder, recurrent, moderate: Secondary | ICD-10-CM | POA: Diagnosis not present

## 2018-07-18 DIAGNOSIS — R0602 Shortness of breath: Secondary | ICD-10-CM | POA: Diagnosis not present

## 2018-07-23 DIAGNOSIS — M25669 Stiffness of unspecified knee, not elsewhere classified: Secondary | ICD-10-CM | POA: Insufficient documentation

## 2018-07-23 DIAGNOSIS — M25661 Stiffness of right knee, not elsewhere classified: Secondary | ICD-10-CM | POA: Diagnosis not present

## 2018-07-31 DIAGNOSIS — M25661 Stiffness of right knee, not elsewhere classified: Secondary | ICD-10-CM | POA: Diagnosis not present

## 2018-08-05 DIAGNOSIS — Z96651 Presence of right artificial knee joint: Secondary | ICD-10-CM | POA: Diagnosis not present

## 2018-08-05 DIAGNOSIS — Z471 Aftercare following joint replacement surgery: Secondary | ICD-10-CM | POA: Diagnosis not present

## 2018-08-08 DIAGNOSIS — M25661 Stiffness of right knee, not elsewhere classified: Secondary | ICD-10-CM | POA: Diagnosis not present

## 2018-08-11 DIAGNOSIS — R5383 Other fatigue: Secondary | ICD-10-CM | POA: Diagnosis not present

## 2018-08-11 DIAGNOSIS — D649 Anemia, unspecified: Secondary | ICD-10-CM | POA: Diagnosis not present

## 2018-08-11 DIAGNOSIS — I10 Essential (primary) hypertension: Secondary | ICD-10-CM | POA: Diagnosis not present

## 2018-08-14 DIAGNOSIS — M25661 Stiffness of right knee, not elsewhere classified: Secondary | ICD-10-CM | POA: Diagnosis not present

## 2018-08-15 DIAGNOSIS — M1711 Unilateral primary osteoarthritis, right knee: Secondary | ICD-10-CM | POA: Diagnosis not present

## 2018-08-15 DIAGNOSIS — F331 Major depressive disorder, recurrent, moderate: Secondary | ICD-10-CM | POA: Diagnosis not present

## 2018-08-15 DIAGNOSIS — K644 Residual hemorrhoidal skin tags: Secondary | ICD-10-CM | POA: Diagnosis not present

## 2018-08-15 DIAGNOSIS — I10 Essential (primary) hypertension: Secondary | ICD-10-CM | POA: Diagnosis not present

## 2018-08-21 DIAGNOSIS — M25669 Stiffness of unspecified knee, not elsewhere classified: Secondary | ICD-10-CM | POA: Diagnosis not present

## 2018-08-22 ENCOUNTER — Encounter: Payer: Self-pay | Admitting: Gastroenterology

## 2018-08-22 ENCOUNTER — Telehealth: Payer: Self-pay

## 2018-08-22 ENCOUNTER — Ambulatory Visit (INDEPENDENT_AMBULATORY_CARE_PROVIDER_SITE_OTHER): Payer: Medicare Other | Admitting: Gastroenterology

## 2018-08-22 VITALS — BP 92/52 | HR 64 | Ht 70.0 in | Wt 193.0 lb

## 2018-08-22 DIAGNOSIS — K5900 Constipation, unspecified: Secondary | ICD-10-CM | POA: Diagnosis not present

## 2018-08-22 DIAGNOSIS — K625 Hemorrhage of anus and rectum: Secondary | ICD-10-CM | POA: Diagnosis not present

## 2018-08-22 DIAGNOSIS — R194 Change in bowel habit: Secondary | ICD-10-CM | POA: Diagnosis not present

## 2018-08-22 MED ORDER — NA SULFATE-K SULFATE-MG SULF 17.5-3.13-1.6 GM/177ML PO SOLN: ORAL | 0 refills | Status: DC

## 2018-08-22 NOTE — Patient Instructions (Signed)
If you are age 76 or older, your body mass index should be between 23-30. Your Body mass index is 27.69 kg/m. If this is out of the aforementioned range listed, please consider follow up with your Primary Care Provider.  If you are age 7 or younger, your body mass index should be between 19-25. Your Body mass index is 27.69 kg/m. If this is out of the aformentioned range listed, please consider follow up with your Primary Care Provider.   You have been scheduled for a colonoscopy. Please follow written instructions given to you at your visit today.  Please pick up your prep supplies at the pharmacy within the next 1-3 days. If you use inhalers (even only as needed), please bring them with you on the day of your procedure. Your physician has requested that you go to www.startemmi.com and enter the access code given to you at your visit today. This web site gives a general overview about your procedure. However, you should still follow specific instructions given to you by our office regarding your preparation for the procedure.  We have sent the following medications to your pharmacy for you to pick up at your convenience: Rose daily and increase to twice daily if needed. Try prunes and or Milk of Magnesia as needed.  We are requesting labs from Dr. Ernie Hew.  Thank you for choosing me and [facility].  Alonza Bogus, PA-C

## 2018-08-22 NOTE — Telephone Encounter (Signed)
Left message on pts voicemail regarding low blood pressure per Jessica's instructions.  I requested that patient give me a call back to confirm receipt of message.

## 2018-08-22 NOTE — Telephone Encounter (Signed)
-----   Message from Loralie Champagne, PA-C sent at 08/22/2018  2:59 PM EST ----- I did not see her repeat blood pressure until after she left and was doing her note.  Please let her know that it is low particularly for her according to her past readings.  She did have nausea and vomiting yesterday so this could represent a little bit of dehydration.  Please tell her to drink plenty of fluids and if she has a blood pressure monitor at home to check it there as well.  If she continues to feel generally poorly then she should see her PCP to see if the low BP is an ongoing issue.  Thank you,  Jess

## 2018-08-22 NOTE — Progress Notes (Signed)
08/22/2018 Caryl Asp OLUWATOSIN BRACY [id number] 07/02/1941   HISTORY OF PRESENT ILLNESS: This is a pleasant 77 year old female who is a patient of Dr. Woodward Ku.  She presents here today to discuss colonoscopy and management of her constipation.  She had a total knee replacement about 2 months ago.  Since that time she has had change in her bowel habits with constipation.  Says that she will have the urge to have a bowel movement then but then just passes gas and and a very small amounts of stool.  After her surgery she had been placed on Xarelto, but then was hospitalized for GI bleeding.  They thought it was likely hemorrhoid bleeding, but recommended outpatient colonoscopy when she recovered further from her knee surgery as her last colonoscopy was almost 7.5 years ago at this point.  She was found to have mild diverticulosis and external hemorrhoids at that time.  While she is here today she also reports that yesterday she had nausea and vomiting.  Took zofran and it did help.  Is feeling somewhat better today.  Also reports overall fatigue and decreased energy level.  Had recent labs performed by her PCP so we will try to obtain those results.      Past Medical History:  Diagnosis Date  . Anal or rectal pain    sometimes  . Anemia    hx of during pregnancy  . Arthritis   . Asthma    hx of  . Bradycardia    " I KNOW I HAVE BRADYCARDIA ESPECIALLY WHEN I SLEEP"   . Chronic back pain   . Degenerative joint disease    osteo  . Depression   . Diverticulosis 2003  . Dysrhythmia    hx of  due to eye drop and also low heart rate 40's per pt. Dr. Gwenlyn Found follows  . Elevated total protein   . Esophageal dysmotility   . Fibromyalgia   . GERD (gastroesophageal reflux disease)    subsequent Nissen Fundoplication  . Glaucoma   . Hearing loss   . Hemorrhoids   . Hiatal hernia 11/08/09  . Hx of adenomatous colonic polyps 07/02/02  . Hypercalcemia   . Hyperlipidemia   . Hyperlipidemia   .  Hypertension   . Nausea   . Osteoporosis   . PONV (postoperative nausea and vomiting)   . Rectal bleeding    from hemorrhoids.    . Sleep apnea    DOES USE CPAP   . Thrombocytopenia ([place])   . Varicose veins of left lower extremity    Past Surgical History:  Procedure Laterality Date  . CARDIAC CATHETERIZATION  03/14/1992   Normal cardiac cath. Normal LV function.  [name] CARDIOVASCULAR STRESS TEST  01/22/2011   No scintigraphic evidence of inducible ischemia.  [name] CAROTID DOPPLER  03/31/2007   Bilateral ICAs - no evidence of significant diameter reduction, dissectin, tortuosity, FMD, or any other vascular abnormality.  . ESOPHAGEAL MANOMETRY N/A 11/14/2015   Procedure: ESOPHAGEAL MANOMETRY (EM);  Surgeon: Mauri Pole, MD;  Location: [facility];  Service: Endoscopy;  Laterality: N/A;  . GASTRIC RESECTION  2009  . JOINT REPLACEMENT     right knee Dr. Wynelle Link 06-23-18  . NISSEN FUNDOPLICATION  0388   with subsequent takedown in 2009  . TOTAL KNEE ARTHROPLASTY Left 06/10/2017   Procedure: LEFT  TOTAL KNEE ARTHROPLASTY;  Surgeon: Gaynelle Arabian, MD;  Location: [facility];  Service: Orthopedics;  Laterality: Left;  Adductor Block  . TOTAL KNEE ARTHROPLASTY  Right 06/23/2018   Procedure: RIGHT TOTAL KNEE ARTHROPLASTY;  Surgeon: Gaynelle Arabian, MD;  Location: [facility];  Service: Orthopedics;  Laterality: Right;  . TRANSTHORACIC ECHOCARDIOGRAM  12/21/2010   EF 60%, moderate LVH,     reports that she has never smoked. She has never used smokeless tobacco. She reports that she does not drink alcohol or use drugs. family history includes Breast cancer in her daughter; Cancer in her maternal grandmother; Diabetes in her father and mother; Heart disease in her mother; Hypertension in her mother; Kidney disease in her father and maternal grandfather; Leukemia in her maternal uncle; Prostate cancer in her maternal uncle; Stomach cancer in her maternal aunt; Stroke in her brother; Uterine cancer in her maternal  aunt. Allergies  Allergen Reactions  . Adalat [Nifedipine] Other (See Comments)    Unknown  . Hydrocodone Itching and Nausea And Vomiting  . Meperidine Hcl Other (See Comments)    Demerol causes hallucinations  . Naproxen Nausea And Vomiting      Outpatient Encounter Medications as of 08/22/2018  Medication Sig  . Albuterol Sulfate (PROAIR RESPICLICK) 811 (90 Base) MCG/ACT AEPB Inhale 2 puffs into the lungs every 6 (six) hours as needed. (Patient taking differently: Inhale 2 puffs into the lungs every 6 (six) hours as needed (for shortness of breath/wheezing). )  . bimatoprost (LUMIGAN) 0.01 % SOLN Place 1 drop into both eyes at bedtime.   [name] buPROPion (WELLBUTRIN SR) 150 MG 12 hr tablet Take 150 mg by mouth 2 (two) times daily.  . cetirizine (ZYRTEC) 10 MG tablet Take 10 mg by mouth at bedtime.  [name] FLUoxetine (PROZAC) 20 MG tablet Take 20 mg by mouth daily.  [name] gabapentin (NEURONTIN) 300 MG capsule Take 1 capsule (300 mg total) by mouth 3 (three) times daily. Gabapentin 300 mg Protocol Take a 300 mg capsule three times a day for two weeks following surgery. Then take a 300 mg capsule two times a day for two weeks.  Then take a 300 mg capsule once a day for two weeks.  Then discontinue the Gabapentin.  . hydrocortisone (ANUSOL-HC) 2.5 % rectal cream Apply rectally 2 times daily  . losartan (COZAAR) 50 MG tablet Take 50 mg by mouth daily.   . methocarbamol (ROBAXIN) 500 MG tablet Take 1 tablet (500 mg total) by mouth every 6 (six) hours as needed for muscle spasms.  . montelukast (SINGULAIR) 10 MG tablet Take 10 mg by mouth at bedtime.  [name] nystatin (MYCOSTATIN/NYSTOP) powder Apply 1 g topically daily as needed (for rash).   . nystatin ointment (MYCOSTATIN) Apply 1 application topically 2 (two) times daily as needed (for rash).   [name] omeprazole (PRILOSEC) 40 MG capsule Take 40 mg by mouth daily.   . ondansetron (ZOFRAN) 4 MG tablet Take 1 tablet (4 mg total) by mouth every 8 (eight) hours as  needed for nausea or vomiting.  . psyllium (HYDROCIL/METAMUCIL) 95 % PACK Take 1 packet by mouth 2 (two) times daily.  . ranitidine (ZANTAC) 300 MG tablet Take 1 tablet (300 mg total) by mouth at bedtime.  . rosuvastatin (CRESTOR) 40 MG tablet Take 40 mg by mouth daily.   . traMADol (ULTRAM) 50 MG tablet as needed.   . [DISCONTINUED] HYDROmorphone (DILAUDID) 2 MG tablet Take 1-2 tablets (2-4 mg total) by mouth every 6 (six) hours as needed for severe pain.  . [DISCONTINUED] phenylephrine (,USE FOR PREPARATION-H,) 0.25 % suppository Place 1 suppository rectally at bedtime.  . [DISCONTINUED] polyethylene glycol (MIRALAX / GLYCOLAX)  packet Take 17 g by mouth 2 (two) times daily.  . [DISCONTINUED] traMADol (ULTRAM) 50 MG tablet Take 1-2 tablets (50-100 mg total) by mouth every 6 (six) hours as needed for moderate pain.  . Na Sulfate-K Sulfate-Mg Sulf 17.5-3.13-1.6 GM/177ML SOLN Suprep-Use as directed  . [DISCONTINUED] rivaroxaban (XARELTO) 10 MG TABS tablet Take 1 tablet (10 mg total) by mouth daily with breakfast for 17 days. Then take one 81 mg aspirin once a day for three weeks. Then discontinue aspirin.   No facility-administered encounter medications on file as of 08/22/2018.      REVIEW OF SYSTEMS  : All other systems reviewed and negative except where noted in the History of Present Illness.   PHYSICAL EXAM: BP (!) 92/52   Pulse 64   Ht 5\' 10"  (1.778 m)   Wt 193 lb (87.5 kg)   BMI 27.69 kg/m  General: Well developed black female in no acute distress Head: Normocephalic and atraumatic Eyes:  Sclerae anicteric, conjunctiva pink. Ears: Normal auditory acuity Lungs: Clear throughout to auscultation; no increased WOB. Heart: Regular rate and rhythm; no M/R/G. Abdomen: Soft, non-distended.  BS present.  Non-tender. Rectal:  Will be done at the time of colonoscopy. Musculoskeletal: Symmetrical with no gross deformities  Skin: No lesions on visible extremities Extremities: No edema    Neurological: Alert oriented x 4, grossly non-focal Psychological:  Alert and cooperative. Normal mood and affect  ASSESSMENT AND PLAN: *Change in bowel habits with constipation over the past couple of months: This is likely multifactorial as it has occurred since her knee replacement surgery.  Probably combination of medication use with pain medication and muscle relaxants as well as decreased activity/mobility.  I have suggested that she start taking MiraLAX daily and increase to twice daily if needed.  Can use prunes and/or milk of magnesia as needed as well. *Rectal bleeding: Had bleeding while on Xarelto after her knee replacement back in September.  They had requested outpatient colonoscopy at that time once she recovered more from her surgery.  She has had no more bleeding since being off of the anticoagulation.  Last colonoscopy was 7.5 years ago.  Will schedule colonoscopy with Dr. Silverio Decamp.  Had recent labs by her PCP so we will request those records. *Nausea and vomiting:  Yesterday only.  Feeling somewhat better today.  Zofran did help.  Will just monitor for now as it very well could have been something infectious.  **The risks, benefits, and alternatives to colonoscopy were discussed with the patient and she consents to proceed.   CC:  Fanny Bien, MD

## 2018-08-25 NOTE — Progress Notes (Signed)
Reviewed and agree with documentation and assessment and plan. K. Veena Kullen Tomasetti , MD   

## 2018-08-26 DIAGNOSIS — M25661 Stiffness of right knee, not elsewhere classified: Secondary | ICD-10-CM | POA: Diagnosis not present

## 2018-08-27 DIAGNOSIS — H401133 Primary open-angle glaucoma, bilateral, severe stage: Secondary | ICD-10-CM | POA: Diagnosis not present

## 2018-08-29 DIAGNOSIS — E782 Mixed hyperlipidemia: Secondary | ICD-10-CM | POA: Diagnosis not present

## 2018-08-29 DIAGNOSIS — Z79899 Other long term (current) drug therapy: Secondary | ICD-10-CM | POA: Diagnosis not present

## 2018-09-03 DIAGNOSIS — Z6828 Body mass index (BMI) 28.0-28.9, adult: Secondary | ICD-10-CM | POA: Diagnosis not present

## 2018-09-03 DIAGNOSIS — E782 Mixed hyperlipidemia: Secondary | ICD-10-CM | POA: Diagnosis not present

## 2018-09-03 DIAGNOSIS — R739 Hyperglycemia, unspecified: Secondary | ICD-10-CM | POA: Diagnosis not present

## 2018-09-03 DIAGNOSIS — K219 Gastro-esophageal reflux disease without esophagitis: Secondary | ICD-10-CM | POA: Diagnosis not present

## 2018-09-03 DIAGNOSIS — F331 Major depressive disorder, recurrent, moderate: Secondary | ICD-10-CM | POA: Diagnosis not present

## 2018-09-30 ENCOUNTER — Encounter: Payer: Self-pay | Admitting: Gastroenterology

## 2018-09-30 ENCOUNTER — Ambulatory Visit (AMBULATORY_SURGERY_CENTER): Payer: Medicare Other | Admitting: Gastroenterology

## 2018-09-30 VITALS — BP 140/62 | HR 60 | Temp 98.4°F | Resp 12 | Ht 70.0 in | Wt 193.0 lb

## 2018-09-30 DIAGNOSIS — M797 Fibromyalgia: Secondary | ICD-10-CM | POA: Diagnosis not present

## 2018-09-30 DIAGNOSIS — I10 Essential (primary) hypertension: Secondary | ICD-10-CM | POA: Diagnosis not present

## 2018-09-30 DIAGNOSIS — K5900 Constipation, unspecified: Secondary | ICD-10-CM | POA: Diagnosis not present

## 2018-09-30 DIAGNOSIS — R194 Change in bowel habit: Secondary | ICD-10-CM

## 2018-09-30 DIAGNOSIS — D123 Benign neoplasm of transverse colon: Secondary | ICD-10-CM | POA: Diagnosis not present

## 2018-09-30 DIAGNOSIS — K219 Gastro-esophageal reflux disease without esophagitis: Secondary | ICD-10-CM | POA: Diagnosis not present

## 2018-09-30 DIAGNOSIS — K625 Hemorrhage of anus and rectum: Secondary | ICD-10-CM

## 2018-09-30 DIAGNOSIS — G4733 Obstructive sleep apnea (adult) (pediatric): Secondary | ICD-10-CM | POA: Diagnosis not present

## 2018-09-30 DIAGNOSIS — J45909 Unspecified asthma, uncomplicated: Secondary | ICD-10-CM | POA: Diagnosis not present

## 2018-09-30 MED ORDER — SODIUM CHLORIDE 0.9 % IV SOLN: 500.0000 mL | Freq: Once | INTRAVENOUS | Status: DC

## 2018-09-30 NOTE — Progress Notes (Signed)
Report to PACU, RN, vss, BBS= Clear.  

## 2018-09-30 NOTE — Op Note (Signed)
[place] Patient Name: [name] Procedure Date: 09/30/2018 11:53 AM MRN: [id number] Endoscopist: Mauri Pole , MD Age: 77 Referring MD:  Date of Birth: 1941/04/17 Gender: Female Account #: [id number] Procedure:                Colonoscopy Indications:              Evaluation of unexplained GI bleeding Medicines:                Monitored Anesthesia Care Procedure:                Pre-Anesthesia Assessment:                           - Prior to the procedure, a History and Physical                            was performed, and patient medications and                            allergies were reviewed. The patient's tolerance of                            previous anesthesia was also reviewed. The risks                            and benefits of the procedure and the sedation                            options and risks were discussed with the patient.                            All questions were answered, and informed consent                            was obtained. Prior Anticoagulants: The patient has                            taken no previous anticoagulant or antiplatelet                            agents. ASA Grade Assessment: III - A patient with                            severe systemic disease. After reviewing the risks                            and benefits, the patient was deemed in                            satisfactory condition to undergo the procedure.                           After obtaining informed consent, the colonoscope  was passed under direct vision. Throughout the                            procedure, the patient's blood pressure, pulse, and                            oxygen saturations were monitored continuously. The                            Colonoscope was introduced through the anus and                            advanced to the the cecum, identified by                            appendiceal orifice and  ileocecal valve. The                            colonoscopy was performed without difficulty. The                            patient tolerated the procedure well. The quality                            of the bowel preparation was excellent. The                            ileocecal valve, appendiceal orifice, and rectum                            were photographed. Scope In: 12:03:23 PM Scope Out: 12:26:42 PM Scope Withdrawal Time: 0 hours 6 minutes 57 seconds  Total Procedure Duration: 0 hours 23 minutes 19 seconds  Findings:                 The perianal and digital rectal examinations were                            normal.                           A 5 mm polyp was found in the transverse colon. The                            polyp was sessile. The polyp was removed with a                            cold snare. Resection and retrieval were complete.                           Multiple small and large-mouthed diverticula were                            found in the sigmoid colon, descending colon,  transverse colon, ascending colon and cecum.                           Non-bleeding internal hemorrhoids were found during                            retroflexion. The hemorrhoids were large. Complications:            No immediate complications. Estimated Blood Loss:     Estimated blood loss was minimal. Impression:               - One 5 mm polyp in the transverse colon, removed                            with a cold snare. Resected and retrieved.                           - Moderate diverticulosis in the sigmoid colon, in                            the descending colon, in the transverse colon, in                            the ascending colon and in the cecum.                           - Non-bleeding internal hemorrhoids. Recommendation:           - Patient has a contact number available for                            emergencies. The signs and symptoms of  potential                            delayed complications were discussed with the                            patient. Return to normal activities tomorrow.                            Written discharge instructions were provided to the                            patient.                           - Resume previous diet.                           - Continue present medications.                           - Await pathology results.                           - Repeat colonoscopy date to be determined after  pending pathology results are reviewed for                            surveillance based on pathology results. Mauri Pole, MD 09/30/2018 12:34:47 PM This report has been signed electronically.

## 2018-09-30 NOTE — Patient Instructions (Addendum)
YOU HAD AN ENDOSCOPIC PROCEDURE TODAY AT [facility]:   Refer to the procedure report that was given to you for any specific questions about what was found during the examination.  If the procedure report does not answer your questions, please call your gastroenterologist to clarify.  If you requested that your care partner not be given the details of your procedure findings, then the procedure report has been included in a sealed envelope for you to review at your convenience later.  YOU SHOULD EXPECT: Some feelings of bloating in the abdomen. Passage of more gas than usual.  Walking can help get rid of the air that was put into your GI tract during the procedure and reduce the bloating. If you had a lower endoscopy (such as a colonoscopy or flexible sigmoidoscopy) you may notice spotting of blood in your stool or on the toilet paper. If you underwent a bowel prep for your procedure, you may not have a normal bowel movement for a few days.  Please Note:  You might notice some irritation and congestion in your nose or some drainage.  This is from the oxygen used during your procedure.  There is no need for concern and it should clear up in a day or so.  SYMPTOMS TO REPORT IMMEDIATELY:   Following lower endoscopy (colonoscopy or flexible sigmoidoscopy):  Excessive amounts of blood in the stool  Significant tenderness or worsening of abdominal pains  Swelling of the abdomen that is new, acute  Fever of 100F o reached at any hour by calling [phone].   DIET:  We do recommend a small meal at first, but then you may proceed to your regular diet.  Drink plenty of fluids but you should avoid alcoholic beverages for 24 hours.  ACTIVITY:  You should plan to take it easy for the rest of today and you should NOT DRIVE or use heavy machinery until tomorrow (because of the sedation medicines used during the test).    FOLLOW UP: Our staff will call the number listed on your records  the next business day following your procedure to check on you and address any questions or concerns that you may have regarding the information given to you following your procedure. If we do not reach you, we will leave a message.  However, if you are feeling well and you are not experiencing any problems, there is no need to return our call.  We will assume that you have returned to your regular daily activities without incident.  If any biopsies were taken you will be contacted by phone or by letter within the next 1-3 weeks.  Please call us at [phone] if you have not heard about the biopsies in 3 weeks.    SIGNATURES/CONFIDENTIALITY: You and/or your care partner have signed paperwork which will be entered into your electronic medical record.  These signatures attest to the fact that that the information above on your After Visit Summary has been reviewed and is understood.  Full responsibility of the confidentiality of this discharge information lies with you and/or your care-partner.  Polyp, diverticulosis, and hemorrhoid banding information given.   Miralax 1 capful twice daily and adjust as needed   See Dr Silverio Decamp back in office in 6 months,sooner if needed

## 2018-09-30 NOTE — Progress Notes (Signed)
Called to room to assist during endoscopic procedure.  Patient ID and intended procedure confirmed with present staff. Received instructions for my participation in the procedure from the performing physician.  

## 2018-10-01 ENCOUNTER — Telehealth: Payer: Self-pay

## 2018-10-01 NOTE — Telephone Encounter (Signed)
  Follow up Call-  Call back number 09/30/2018  Post procedure Call Back phone  # [phone]  Permission to leave phone message Yes  Some recent data might be hidden     No ID on voicemail. No message left.

## 2018-10-01 NOTE — Telephone Encounter (Signed)
  Follow up Call-  Call back number 09/30/2018  Post procedure Call Back phone  # [phone]  Permission to leave phone message Yes  Some recent data might be hidden     Patient questions:  Do you have a fever, pain , or abdominal swelling? No. Pain Score  0 *  Have you tolerated food without any problems? Yes.    Have you been able to return to your normal activities? Yes.    Do you have any questions about your discharge instructions: Diet   No. Medications  No. Follow up visit  No.  Do you have questions or concerns about your Care? No.  Actions: * If pain score is 4 or above: No action needed, pain <4.  No problems noted per pt. maw

## 2018-10-06 ENCOUNTER — Encounter: Payer: Self-pay | Admitting: Gastroenterology

## 2018-10-10 ENCOUNTER — Other Ambulatory Visit: Payer: Self-pay

## 2018-10-22 DIAGNOSIS — F331 Major depressive disorder, recurrent, moderate: Secondary | ICD-10-CM | POA: Diagnosis not present

## 2018-10-22 DIAGNOSIS — R682 Dry mouth, unspecified: Secondary | ICD-10-CM | POA: Diagnosis not present

## 2018-10-22 DIAGNOSIS — K635 Polyp of colon: Secondary | ICD-10-CM | POA: Diagnosis not present

## 2018-10-22 DIAGNOSIS — F411 Generalized anxiety disorder: Secondary | ICD-10-CM | POA: Diagnosis not present

## 2018-11-17 DIAGNOSIS — J019 Acute sinusitis, unspecified: Secondary | ICD-10-CM | POA: Diagnosis not present

## 2018-11-17 DIAGNOSIS — J069 Acute upper respiratory infection, unspecified: Secondary | ICD-10-CM | POA: Diagnosis not present

## 2018-11-17 DIAGNOSIS — Z6829 Body mass index (BMI) 29.0-29.9, adult: Secondary | ICD-10-CM | POA: Diagnosis not present

## 2019-01-24 ENCOUNTER — Other Ambulatory Visit: Payer: Self-pay | Admitting: Gastroenterology

## 2019-02-04 DIAGNOSIS — H524 Presbyopia: Secondary | ICD-10-CM | POA: Diagnosis not present

## 2019-02-04 DIAGNOSIS — H401133 Primary open-angle glaucoma, bilateral, severe stage: Secondary | ICD-10-CM | POA: Diagnosis not present

## 2019-02-05 DIAGNOSIS — Z471 Aftercare following joint replacement surgery: Secondary | ICD-10-CM | POA: Diagnosis not present

## 2019-02-05 DIAGNOSIS — Z96651 Presence of right artificial knee joint: Secondary | ICD-10-CM | POA: Diagnosis not present

## 2019-03-04 DIAGNOSIS — E782 Mixed hyperlipidemia: Secondary | ICD-10-CM | POA: Diagnosis not present

## 2019-03-04 DIAGNOSIS — K219 Gastro-esophageal reflux disease without esophagitis: Secondary | ICD-10-CM | POA: Diagnosis not present

## 2019-03-04 DIAGNOSIS — I10 Essential (primary) hypertension: Secondary | ICD-10-CM | POA: Diagnosis not present

## 2019-03-04 DIAGNOSIS — M170 Bilateral primary osteoarthritis of knee: Secondary | ICD-10-CM | POA: Diagnosis not present

## 2019-03-06 DIAGNOSIS — M170 Bilateral primary osteoarthritis of knee: Secondary | ICD-10-CM | POA: Diagnosis not present

## 2019-03-06 DIAGNOSIS — I10 Essential (primary) hypertension: Secondary | ICD-10-CM | POA: Diagnosis not present

## 2019-03-06 DIAGNOSIS — R2689 Other abnormalities of gait and mobility: Secondary | ICD-10-CM | POA: Diagnosis not present

## 2019-03-06 DIAGNOSIS — M722 Plantar fascial fibromatosis: Secondary | ICD-10-CM | POA: Diagnosis not present

## 2019-03-06 DIAGNOSIS — R001 Bradycardia, unspecified: Secondary | ICD-10-CM | POA: Diagnosis not present

## 2019-03-06 DIAGNOSIS — E119 Type 2 diabetes mellitus without complications: Secondary | ICD-10-CM | POA: Diagnosis not present

## 2019-03-06 DIAGNOSIS — F411 Generalized anxiety disorder: Secondary | ICD-10-CM | POA: Diagnosis not present

## 2019-03-10 DIAGNOSIS — E119 Type 2 diabetes mellitus without complications: Secondary | ICD-10-CM | POA: Diagnosis not present

## 2019-03-10 DIAGNOSIS — F411 Generalized anxiety disorder: Secondary | ICD-10-CM | POA: Diagnosis not present

## 2019-03-10 DIAGNOSIS — M722 Plantar fascial fibromatosis: Secondary | ICD-10-CM | POA: Diagnosis not present

## 2019-03-10 DIAGNOSIS — R001 Bradycardia, unspecified: Secondary | ICD-10-CM | POA: Diagnosis not present

## 2019-03-10 DIAGNOSIS — M170 Bilateral primary osteoarthritis of knee: Secondary | ICD-10-CM | POA: Diagnosis not present

## 2019-03-10 DIAGNOSIS — I10 Essential (primary) hypertension: Secondary | ICD-10-CM | POA: Diagnosis not present

## 2019-03-18 DIAGNOSIS — R001 Bradycardia, unspecified: Secondary | ICD-10-CM | POA: Diagnosis not present

## 2019-03-18 DIAGNOSIS — I10 Essential (primary) hypertension: Secondary | ICD-10-CM | POA: Diagnosis not present

## 2019-03-18 DIAGNOSIS — E119 Type 2 diabetes mellitus without complications: Secondary | ICD-10-CM | POA: Diagnosis not present

## 2019-03-18 DIAGNOSIS — M722 Plantar fascial fibromatosis: Secondary | ICD-10-CM | POA: Diagnosis not present

## 2019-03-18 DIAGNOSIS — F411 Generalized anxiety disorder: Secondary | ICD-10-CM | POA: Diagnosis not present

## 2019-03-18 DIAGNOSIS — M170 Bilateral primary osteoarthritis of knee: Secondary | ICD-10-CM | POA: Diagnosis not present

## 2019-03-24 DIAGNOSIS — E119 Type 2 diabetes mellitus without complications: Secondary | ICD-10-CM | POA: Diagnosis not present

## 2019-03-24 DIAGNOSIS — F411 Generalized anxiety disorder: Secondary | ICD-10-CM | POA: Diagnosis not present

## 2019-03-24 DIAGNOSIS — M170 Bilateral primary osteoarthritis of knee: Secondary | ICD-10-CM | POA: Diagnosis not present

## 2019-03-24 DIAGNOSIS — R001 Bradycardia, unspecified: Secondary | ICD-10-CM | POA: Diagnosis not present

## 2019-03-24 DIAGNOSIS — I10 Essential (primary) hypertension: Secondary | ICD-10-CM | POA: Diagnosis not present

## 2019-03-24 DIAGNOSIS — M722 Plantar fascial fibromatosis: Secondary | ICD-10-CM | POA: Diagnosis not present

## 2019-03-30 ENCOUNTER — Telehealth: Payer: Self-pay | Admitting: *Deleted

## 2019-03-30 NOTE — Telephone Encounter (Signed)
A message was left, re: follow up visit. 

## 2019-04-02 DIAGNOSIS — M722 Plantar fascial fibromatosis: Secondary | ICD-10-CM | POA: Diagnosis not present

## 2019-04-02 DIAGNOSIS — R001 Bradycardia, unspecified: Secondary | ICD-10-CM | POA: Diagnosis not present

## 2019-04-02 DIAGNOSIS — E119 Type 2 diabetes mellitus without complications: Secondary | ICD-10-CM | POA: Diagnosis not present

## 2019-04-02 DIAGNOSIS — I10 Essential (primary) hypertension: Secondary | ICD-10-CM | POA: Diagnosis not present

## 2019-04-02 DIAGNOSIS — F411 Generalized anxiety disorder: Secondary | ICD-10-CM | POA: Diagnosis not present

## 2019-04-02 DIAGNOSIS — M170 Bilateral primary osteoarthritis of knee: Secondary | ICD-10-CM | POA: Diagnosis not present

## 2019-04-05 DIAGNOSIS — R001 Bradycardia, unspecified: Secondary | ICD-10-CM | POA: Diagnosis not present

## 2019-04-05 DIAGNOSIS — I10 Essential (primary) hypertension: Secondary | ICD-10-CM | POA: Diagnosis not present

## 2019-04-05 DIAGNOSIS — M170 Bilateral primary osteoarthritis of knee: Secondary | ICD-10-CM | POA: Diagnosis not present

## 2019-04-05 DIAGNOSIS — E119 Type 2 diabetes mellitus without complications: Secondary | ICD-10-CM | POA: Diagnosis not present

## 2019-04-05 DIAGNOSIS — R2689 Other abnormalities of gait and mobility: Secondary | ICD-10-CM | POA: Diagnosis not present

## 2019-04-05 DIAGNOSIS — F411 Generalized anxiety disorder: Secondary | ICD-10-CM | POA: Diagnosis not present

## 2019-04-05 DIAGNOSIS — M722 Plantar fascial fibromatosis: Secondary | ICD-10-CM | POA: Diagnosis not present

## 2019-04-06 ENCOUNTER — Telehealth: Payer: Self-pay | Admitting: Pulmonary Disease

## 2019-04-06 DIAGNOSIS — R001 Bradycardia, unspecified: Secondary | ICD-10-CM | POA: Diagnosis not present

## 2019-04-06 DIAGNOSIS — F411 Generalized anxiety disorder: Secondary | ICD-10-CM | POA: Diagnosis not present

## 2019-04-06 DIAGNOSIS — I10 Essential (primary) hypertension: Secondary | ICD-10-CM | POA: Diagnosis not present

## 2019-04-06 DIAGNOSIS — M170 Bilateral primary osteoarthritis of knee: Secondary | ICD-10-CM | POA: Diagnosis not present

## 2019-04-06 DIAGNOSIS — M722 Plantar fascial fibromatosis: Secondary | ICD-10-CM | POA: Diagnosis not present

## 2019-04-06 DIAGNOSIS — E119 Type 2 diabetes mellitus without complications: Secondary | ICD-10-CM | POA: Diagnosis not present

## 2019-04-06 NOTE — Telephone Encounter (Signed)
I called the number provided and [phone] did not have an "[place]" greeting. Searched the number on internet and it did not result with an attachment to any business.  I called [employer] on [phone] and the rep was able to confirm they do show this patient in their system and someone from their company made a notation regarding this patient and a request made. I advised this patient has not been seen since 2017.  Rep stated she would make a notation in the system and that an OV would be required.  Nothing further needed at this time.

## 2019-04-07 DIAGNOSIS — I10 Essential (primary) hypertension: Secondary | ICD-10-CM | POA: Diagnosis not present

## 2019-04-07 DIAGNOSIS — R001 Bradycardia, unspecified: Secondary | ICD-10-CM | POA: Diagnosis not present

## 2019-04-07 DIAGNOSIS — M170 Bilateral primary osteoarthritis of knee: Secondary | ICD-10-CM | POA: Diagnosis not present

## 2019-04-07 DIAGNOSIS — E119 Type 2 diabetes mellitus without complications: Secondary | ICD-10-CM | POA: Diagnosis not present

## 2019-04-20 DIAGNOSIS — E559 Vitamin D deficiency, unspecified: Secondary | ICD-10-CM | POA: Diagnosis not present

## 2019-04-20 DIAGNOSIS — E782 Mixed hyperlipidemia: Secondary | ICD-10-CM | POA: Diagnosis not present

## 2019-04-20 DIAGNOSIS — R5383 Other fatigue: Secondary | ICD-10-CM | POA: Diagnosis not present

## 2019-04-20 DIAGNOSIS — I10 Essential (primary) hypertension: Secondary | ICD-10-CM | POA: Diagnosis not present

## 2019-04-21 ENCOUNTER — Ambulatory Visit: Payer: Medicare Other | Admitting: Pulmonary Disease

## 2019-04-24 DIAGNOSIS — R001 Bradycardia, unspecified: Secondary | ICD-10-CM | POA: Diagnosis not present

## 2019-04-24 DIAGNOSIS — M170 Bilateral primary osteoarthritis of knee: Secondary | ICD-10-CM | POA: Diagnosis not present

## 2019-04-24 DIAGNOSIS — I10 Essential (primary) hypertension: Secondary | ICD-10-CM | POA: Diagnosis not present

## 2019-04-24 DIAGNOSIS — M722 Plantar fascial fibromatosis: Secondary | ICD-10-CM | POA: Diagnosis not present

## 2019-04-24 DIAGNOSIS — E119 Type 2 diabetes mellitus without complications: Secondary | ICD-10-CM | POA: Diagnosis not present

## 2019-04-24 DIAGNOSIS — F411 Generalized anxiety disorder: Secondary | ICD-10-CM | POA: Diagnosis not present

## 2019-05-04 ENCOUNTER — Encounter: Payer: Self-pay | Admitting: Pulmonary Disease

## 2019-05-04 ENCOUNTER — Ambulatory Visit (INDEPENDENT_AMBULATORY_CARE_PROVIDER_SITE_OTHER): Payer: Medicare Other | Admitting: Pulmonary Disease

## 2019-05-04 ENCOUNTER — Other Ambulatory Visit: Payer: Self-pay

## 2019-05-04 VITALS — BP 130/78 | HR 60 | Temp 98.3°F | Ht 70.0 in | Wt 202.4 lb

## 2019-05-04 DIAGNOSIS — G4733 Obstructive sleep apnea (adult) (pediatric): Secondary | ICD-10-CM

## 2019-05-04 NOTE — Progress Notes (Signed)
CLEVELAND PAIZ    [id number]    10-Dec-1940  Primary Care Physician:[name], Mechele Claude, MD  Referring Physician: Fanny Bien, [address] [place],  [place] [address]  Chief complaint:   Follow up for Chronic cough OSA GERD  HPI: Mrs. Stankovic has PMH of hiatal hernia, GERD, reflux, fibromyalgia.  Follow-up for chronic cough.  Non productive in nature, not associated with dyspnea, wheezing. The cough is worse at night and sometimes she wakes up coughing. She has not been tried on any inhales and tessalon perles helped with cough in past. She has significant issues with allergic rhinitis and post nasal drip. She on Singulair, Claritin and nasonex. She has  GERD and hiatal hernia s/p Nissens fundoplication with subsequent take down in 09. Esophageal manometry in Oct 31, 2017 was normal. Follows with Dr. Silverio Decamp   She has H/O OSA with a positive study in 52. On CPAP at home  Interim history: Mrs. Krusemark is back in clinic after being lost to follow-up since 2017.  Needs an office visit to get CPAP supplies States that she has been using a CPAP regularly with no issues Chronic cough is improved since last visit.  She is on PPI for GERD.  Outpatient Encounter Medications as of 05/04/2019  Medication Sig  . Albuterol Sulfate (PROAIR RESPICLICK) 937 (90 Base) MCG/ACT AEPB Inhale 2 puffs into the lungs every 6 (six) hours as needed. (Patient taking differently: Inhale 2 puffs into the lungs every 6 (six) hours as needed (for shortness of breath/wheezing). )  . bimatoprost (LUMIGAN) 0.01 % SOLN Place 1 drop into both eyes at bedtime.   [name] buPROPion (WELLBUTRIN SR) 150 MG 12 hr tablet Take 150 mg by mouth 2 (two) times daily.  . cetirizine (ZYRTEC) 10 MG tablet Take 10 mg by mouth at bedtime.  [name] FLUoxetine (PROZAC) 20 MG tablet Take 20 mg by mouth daily.  . hydrocortisone (ANUSOL-HC) 2.5 % rectal cream Apply rectally 2 times daily  . losartan (COZAAR) 50 MG tablet Take 50 mg  by mouth daily.   . montelukast (SINGULAIR) 10 MG tablet Take 10 mg by mouth at bedtime.  [name] nystatin (MYCOSTATIN/NYSTOP) powder Apply 1 g topically daily as needed (for rash).   . nystatin ointment (MYCOSTATIN) Apply 1 application topically 2 (two) times daily as needed (for rash).   [name] omeprazole (PRILOSEC) 40 MG capsule Take 40 mg by mouth daily.   . ondansetron (ZOFRAN) 4 MG tablet Take 1 tablet (4 mg total) by mouth every 8 (eight) hours as needed for nausea or vomiting.  . psyllium (HYDROCIL/METAMUCIL) 95 % PACK Take 1 packet by mouth 2 (two) times daily.  . rosuvastatin (CRESTOR) 40 MG tablet Take 40 mg by mouth daily.   . methocarbamol (ROBAXIN) 500 MG tablet Take 1 tablet (500 mg total) by mouth every 6 (six) hours as needed for muscle spasms. (Patient not taking: Reported on 05/04/2019)  . ranitidine (ZANTAC) 300 MG tablet Take 1 tablet (300 mg total) by mouth at bedtime. (Patient not taking: Reported on 05/04/2019)   No facility-administered encounter medications on file as of 05/04/2019.    Physical Exam: Blood pressure 130/78, pulse 60, temperature 98.3 F (36.8 C), temperature source Oral, height 5\' 10"  (1.778 m), weight 202 lb 6.4 oz (91.8 kg), SpO2 98 %. Gen:      No acute distress HEENT:  EOMI, sclera anicteric Neck:     No masses; no thyromegaly  Lungs:    Clear to auscultation bilaterally; normal respiratory effort CV:         Regular rate and rhythm; no murmurs Abd:      + bowel sounds; soft, non-tender; no palpable masses, no distension Ext:    No edema; adequate peripheral perfusion Skin:      Warm and dry; no rash Neuro: alert and oriented x 3 Psych: normal mood and affect  Data Reviewed: Barium swallow 08/21/08 Postop changes from a hiatal hernia repair. No evidence for obstruction or contrast extravasation.  Sleep study 10/13/98 Moderate OSA, RDI 20.  Esophageal manometry 11/14/15 Normal relaxation of EG junction, no significant esophageal peristaltic  abnormality.  CPAP compliance 05/16/16-06/14/16 100% usage for > 4 hrs AHI 3.8  PFTs 04/19/16 FVC 2.70 (99%] FEV1 2.8 (809%) F/F 86 TLC 82% DLCO 58%. Moderate reduction in diffusion capacity that corrects for alveolar volume  Assessment:  Chronic cough Likely secondary to upper airway syndrome from rhinitis, post nasal drip, hiatal hernia, GERD and acid reflux. This is less likely from asthma and reactive airway disease on PFTs. She had been on multiple prednisone tapers recently for her arthritis which did not help with cough. This makes asthma less likely.  Continue acid suppression and GI follow-up.  Her Nissens was taken down in 09 for unclear reasons and she may not be a candidate for a repeat procedure. She has not tolerated chlorpheniramine due to sleepiness.   She is on Losatan which may on occasion cause cough. She had discussed this with her PCP and elected to continue with it as there are not may other options available to treat hypertension. Besides she was taken of losartan in past but this did not help with her cough.  OSA  Continue CPAP at 10.    Download reviewed.  Encouraged her to use the machine daily.  Plan/Recommendations: - Continue PPI. - Continue CPAP at 10 cm  Marshell Garfinkel MD [facility] 05/04/2019, 10:53 AM  CC: Fanny Bien, MD

## 2019-05-04 NOTE — Patient Instructions (Signed)
I am glad you are doing well with the CPAP We will review the download and reorder CPAP at 10 Continue using antiacid medication and allergy medication  Follow-up in 6 months

## 2019-05-06 DIAGNOSIS — I10 Essential (primary) hypertension: Secondary | ICD-10-CM | POA: Diagnosis not present

## 2019-05-06 DIAGNOSIS — R739 Hyperglycemia, unspecified: Secondary | ICD-10-CM | POA: Diagnosis not present

## 2019-05-06 DIAGNOSIS — E559 Vitamin D deficiency, unspecified: Secondary | ICD-10-CM | POA: Diagnosis not present

## 2019-05-06 DIAGNOSIS — E782 Mixed hyperlipidemia: Secondary | ICD-10-CM | POA: Diagnosis not present

## 2019-05-11 ENCOUNTER — Telehealth: Payer: Self-pay | Admitting: *Deleted

## 2019-05-11 NOTE — Telephone Encounter (Signed)
A message was left, re: follow up visit. 

## 2019-05-19 ENCOUNTER — Encounter: Payer: Self-pay | Admitting: Gastroenterology

## 2019-06-15 DIAGNOSIS — Z0000 Encounter for general adult medical examination without abnormal findings: Secondary | ICD-10-CM | POA: Diagnosis not present

## 2019-06-15 DIAGNOSIS — H9190 Unspecified hearing loss, unspecified ear: Secondary | ICD-10-CM | POA: Diagnosis not present

## 2019-06-15 DIAGNOSIS — K30 Functional dyspepsia: Secondary | ICD-10-CM | POA: Diagnosis not present

## 2019-06-18 DIAGNOSIS — Z1159 Encounter for screening for other viral diseases: Secondary | ICD-10-CM | POA: Diagnosis not present

## 2019-06-25 DIAGNOSIS — Z23 Encounter for immunization: Secondary | ICD-10-CM | POA: Diagnosis not present

## 2019-06-25 DIAGNOSIS — H6123 Impacted cerumen, bilateral: Secondary | ICD-10-CM | POA: Diagnosis not present

## 2019-07-02 DIAGNOSIS — Z96651 Presence of right artificial knee joint: Secondary | ICD-10-CM | POA: Diagnosis not present

## 2019-07-02 DIAGNOSIS — Z471 Aftercare following joint replacement surgery: Secondary | ICD-10-CM | POA: Diagnosis not present

## 2019-07-03 DIAGNOSIS — Z96651 Presence of right artificial knee joint: Secondary | ICD-10-CM | POA: Diagnosis not present

## 2019-07-20 DIAGNOSIS — M25561 Pain in right knee: Secondary | ICD-10-CM | POA: Diagnosis not present

## 2019-07-21 DIAGNOSIS — F331 Major depressive disorder, recurrent, moderate: Secondary | ICD-10-CM | POA: Diagnosis not present

## 2019-07-21 DIAGNOSIS — E782 Mixed hyperlipidemia: Secondary | ICD-10-CM | POA: Diagnosis not present

## 2019-07-21 DIAGNOSIS — B354 Tinea corporis: Secondary | ICD-10-CM | POA: Diagnosis not present

## 2019-07-21 DIAGNOSIS — F411 Generalized anxiety disorder: Secondary | ICD-10-CM | POA: Diagnosis not present

## 2019-07-21 DIAGNOSIS — M549 Dorsalgia, unspecified: Secondary | ICD-10-CM | POA: Diagnosis not present

## 2019-07-23 DIAGNOSIS — M25561 Pain in right knee: Secondary | ICD-10-CM | POA: Diagnosis not present

## 2019-08-03 DIAGNOSIS — M25561 Pain in right knee: Secondary | ICD-10-CM | POA: Diagnosis not present

## 2019-08-04 ENCOUNTER — Telehealth: Payer: Self-pay

## 2019-08-04 NOTE — Telephone Encounter (Signed)
lmtcb to reschedule OV or change visit type to virtual

## 2019-08-05 ENCOUNTER — Telehealth: Payer: Medicare Other | Admitting: Cardiovascular Disease

## 2019-08-05 DIAGNOSIS — M25561 Pain in right knee: Secondary | ICD-10-CM | POA: Diagnosis not present

## 2019-08-11 DIAGNOSIS — M25561 Pain in right knee: Secondary | ICD-10-CM | POA: Diagnosis not present

## 2019-08-11 DIAGNOSIS — M25661 Stiffness of right knee, not elsewhere classified: Secondary | ICD-10-CM | POA: Diagnosis not present

## 2019-08-13 DIAGNOSIS — M25561 Pain in right knee: Secondary | ICD-10-CM | POA: Diagnosis not present

## 2019-08-14 DIAGNOSIS — Z96651 Presence of right artificial knee joint: Secondary | ICD-10-CM | POA: Diagnosis not present

## 2019-08-14 DIAGNOSIS — Z471 Aftercare following joint replacement surgery: Secondary | ICD-10-CM | POA: Diagnosis not present

## 2019-08-19 ENCOUNTER — Ambulatory Visit (INDEPENDENT_AMBULATORY_CARE_PROVIDER_SITE_OTHER): Payer: Medicare Other | Admitting: Cardiovascular Disease

## 2019-08-19 ENCOUNTER — Other Ambulatory Visit: Payer: Self-pay

## 2019-08-19 ENCOUNTER — Encounter: Payer: Self-pay | Admitting: Cardiovascular Disease

## 2019-08-19 VITALS — BP 156/75 | HR 60 | Ht 70.5 in | Wt 202.8 lb

## 2019-08-19 DIAGNOSIS — I10 Essential (primary) hypertension: Secondary | ICD-10-CM | POA: Diagnosis not present

## 2019-08-19 NOTE — Assessment & Plan Note (Signed)
History of essential hypertension with blood pressure measured today at 156/75.  She is on losartan however she did not take her medication this morning.

## 2019-08-19 NOTE — Assessment & Plan Note (Signed)
History of hyperlipidemia on statin therapy with lipid profile performed 04/20/2019 revealing total cholesterol of 190, LDL of 90 and HDL of 91.

## 2019-08-19 NOTE — Progress Notes (Signed)
08/19/2019 [name]   04-07-1941  [id number]  Primary Physician Cheryl Bien, MD Primary Cardiologist: Cheryl Harp MD FACP, [place], [place], [place]  HPI:  [name] is a 78 y.o.  mildly overweight, married [name] female, mother of 64, grandmother to 58 grandchildren, whom I last saw on  05/21/2018.  She is accompanied by her husband [name] today she is referred back today for preoperative clearance before elective right total knee replacement scheduled to be performed by Dr. Maureen Bates on September 9.  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  She has a history of hyperlipidemia. She was admitted to [facility] on December 20, 2010, with presyncope. Her symptoms were heralded by an episode of nausea. Her other history is remarkable for degenerative joint disease, depression, and fibromyalgia as well as esophageal dysmotility, status post Nissen fundoplication in the past. A 2D echocardiogram was normal. She was complaining of some chest pain at that time, and a Myoview stress test performed in our office on January 22, 2011, was normal as well.An event monitor was performed That showed heart rates in the 40s especially while she was asleep.Marland KitchenRecently she's been symptomatic from this. She is on Timalol eyeddrops. .Dr. Carey Bates her blood work including lipid profile.  Since I saw her 1 year ago she did have a right total knee replacement by Dr. Maureen Bates which she has had rehabilitation for and is walking better.  She denies chest pain or shortness of breath.   Current Meds  Medication Sig  . Albuterol Sulfate (PROAIR RESPICLICK) 123XX123 (90 Base) MCG/ACT AEPB Inhale 2 puffs into the lungs every 6 (six) hours as needed. (Patient taking differently: Inhale 2 puffs into the lungs every 6 (six) hours as needed (for shortness of breath/wheezing). )  . bimatoprost (LUMIGAN) 0.01 % SOLN Place 1 drop into both eyes at bedtime.   [name] buPROPion (WELLBUTRIN SR) 150 MG 12 hr  tablet Take 150 mg by mouth 2 (two) times daily.  . cetirizine (ZYRTEC) 10 MG tablet Take 10 mg by mouth at bedtime.  [name] FLUoxetine (PROZAC) 20 MG tablet Take 20 mg by mouth daily.  [name] losartan (COZAAR) 50 MG tablet Take 50 mg by mouth daily.   . montelukast (SINGULAIR) 10 MG tablet Take 10 mg by mouth at bedtime.  [name] nystatin (MYCOSTATIN/NYSTOP) powder Apply 1 g topically daily as needed (for rash).   . nystatin ointment (MYCOSTATIN) Apply 1 application topically 2 (two) times daily as needed (for rash).   [name] omeprazole (PRILOSEC) 40 MG capsule Take 40 mg by mouth daily.   . ondansetron (ZOFRAN) 4 MG tablet Take 1 tablet (4 mg total) by mouth every 8 (eight) hours as needed for nausea or vomiting.  . rosuvastatin (CRESTOR) 40 MG tablet Take 40 mg by mouth daily.      Allergies  Allergen Reactions  . Adalat [Nifedipine] Other (See Comments)    Unknown  . Hydrocodone Itching and Nausea And Vomiting  . Meperidine Hcl Other (See Comments)    Demerol causes hallucinations  . Naproxen Nausea And Vomiting    Social History   Socioeconomic History  . Marital status: Married    Spouse name: Not on file  . Number of children: 8  . Years of education: Not on file  . Highest education level: Not on file  Occupational History  . Occupation: retired    Comment: retired Therapist, art.   Social Needs  . Financial resource strain:  Not on file  . Food insecurity    Worry: Not on file    Inability: Not on file  . Transportation needs    Medical: Not on file    Non-medical: Not on file  Tobacco Use  . Smoking status: Never Smoker  . Smokeless tobacco: Never Used  Substance and Sexual Activity  . Alcohol use: No    Alcohol/week: 0.0 standard drinks  . Drug use: No  . Sexual activity: Yes  Lifestyle  . Physical activity    Days per week: Not on file    Minutes per session: Not on file  . Stress: Not on file  Relationships  . Social Herbalist on phone: Not on file    Gets  together: Not on file    Attends religious service: Not on file    Active member of club or organization: Not on file    Attends meetings of clubs or organizations: Not on file    Relationship status: Not on file  . Intimate partner violence    Fear of current or ex partner: Not on file    Emotionally abused: Not on file    Physically abused: Not on file    Forced sexual activity: Not on file  Other Topics Concern  . Not on file  Social History Narrative   Husband, [name] is Next of Kin. Cell # [id number]     Review of Systems: General: negative for chills, fever, night sweats or weight changes.  Cardiovascular: negative for chest pain, dyspnea on exertion, edema, orthopnea, palpitations, paroxysmal nocturnal dyspnea or shortness of breath Dermatological: negative for rash Respiratory: negative for cough or wheezing Urologic: negative for hematuria Abdominal: negative for nausea, vomiting, diarrhea, bright red blood per rectum, melena, or hematemesis Neurologic: negative for visual changes, syncope, or dizziness All other systems reviewed and are otherwise negative except as noted above.    Blood pressure (!) 156/75, pulse 60, height 5' 10.5" (1.791 m), weight 202 lb 12.8 oz (92 kg), SpO2 97 %.  General appearance: alert and no distress Neck: no adenopathy, no carotid bruit, no JVD, supple, symmetrical, trachea midline and thyroid not enlarged, symmetric, no tenderness/mass/nodules Lungs: clear to auscultation bilaterally Heart: regular rate and rhythm, S1, S2 normal, no murmur, click, rub or gallop Extremities: extremities normal, atraumatic, no cyanosis or edema Pulses: 2+ and symmetric Skin: Skin color, texture, turgor normal. No rashes or lesions Neurologic: Alert and oriented X 3, normal strength and tone. Normal symmetric reflexes. Normal coordination and gait  EKG sinus rhythm at 60 with LVH voltage.  I personally reviewed this EKG.  ASSESSMENT AND PLAN:    HYPERLIPIDEMIA History of hyperlipidemia on statin therapy with lipid profile performed 04/20/2019 revealing total cholesterol of 190, LDL of 90 and HDL of 91.  Essential hypertension History of essential hypertension with blood pressure measured today at 156/75.  She is on losartan however she did not take her medication this morning.      Cheryl Harp MD FACP,FACC,FAHA, [facility] 08/19/2019 11:24 AM

## 2019-08-19 NOTE — Patient Instructions (Signed)
Medication Instructions:  Your physician recommends that you continue on your current medications as directed. Please refer to the Current Medication list given to you today.  If you need a refill on your cardiac medications before your next appointment, please call your pharmacy.   Lab work: NONE  Testing/Procedures: NONE  Follow-Up: At [facility], you and your health needs are our priority.  As part of our continuing mission to provide you with exceptional heart care, we have created designated Provider Care Teams.  These Care Teams include your primary Cardiologist (physician) and Advanced Practice Providers (APPs -  Physician Assistants and Nurse Practitioners) who all work together to provide you with the care you need, when you need it. You may see Jonathan Berry, MD or one of the following Advanced Practice Providers on your designated Care Team:    Luke Kilroy, PA-C  Callie Goodrich, PA-C  Jesse Cleaver, FNP  Your physician wants you to follow-up in: 1 year. You will receive a reminder letter in the mail two months in advance. If you don't receive a letter, please call our office to schedule the follow-up appointment.       

## 2019-08-21 DIAGNOSIS — M25561 Pain in right knee: Secondary | ICD-10-CM | POA: Diagnosis not present

## 2019-08-26 DIAGNOSIS — Z822 Family history of deafness and hearing loss: Secondary | ICD-10-CM | POA: Diagnosis not present

## 2019-08-26 DIAGNOSIS — Z77122 Contact with and (suspected) exposure to noise: Secondary | ICD-10-CM | POA: Diagnosis not present

## 2019-08-26 DIAGNOSIS — H903 Sensorineural hearing loss, bilateral: Secondary | ICD-10-CM | POA: Diagnosis not present

## 2019-08-28 DIAGNOSIS — I10 Essential (primary) hypertension: Secondary | ICD-10-CM | POA: Diagnosis not present

## 2019-08-28 DIAGNOSIS — E559 Vitamin D deficiency, unspecified: Secondary | ICD-10-CM | POA: Diagnosis not present

## 2019-08-28 DIAGNOSIS — Z79899 Other long term (current) drug therapy: Secondary | ICD-10-CM | POA: Diagnosis not present

## 2019-08-28 DIAGNOSIS — M25561 Pain in right knee: Secondary | ICD-10-CM | POA: Diagnosis not present

## 2019-08-28 DIAGNOSIS — E782 Mixed hyperlipidemia: Secondary | ICD-10-CM | POA: Diagnosis not present

## 2019-08-28 DIAGNOSIS — R5383 Other fatigue: Secondary | ICD-10-CM | POA: Diagnosis not present

## 2019-09-01 DIAGNOSIS — M25561 Pain in right knee: Secondary | ICD-10-CM | POA: Diagnosis not present

## 2019-09-02 DIAGNOSIS — I10 Essential (primary) hypertension: Secondary | ICD-10-CM | POA: Diagnosis not present

## 2019-09-02 DIAGNOSIS — K219 Gastro-esophageal reflux disease without esophagitis: Secondary | ICD-10-CM | POA: Diagnosis not present

## 2019-09-02 DIAGNOSIS — E119 Type 2 diabetes mellitus without complications: Secondary | ICD-10-CM | POA: Diagnosis not present

## 2019-09-02 DIAGNOSIS — R739 Hyperglycemia, unspecified: Secondary | ICD-10-CM | POA: Diagnosis not present

## 2019-09-03 DIAGNOSIS — M25561 Pain in right knee: Secondary | ICD-10-CM | POA: Diagnosis not present

## 2019-09-04 ENCOUNTER — Other Ambulatory Visit: Payer: Self-pay

## 2019-09-08 DIAGNOSIS — M25561 Pain in right knee: Secondary | ICD-10-CM | POA: Diagnosis not present

## 2019-09-14 DIAGNOSIS — M25561 Pain in right knee: Secondary | ICD-10-CM | POA: Diagnosis not present

## 2019-09-16 DIAGNOSIS — M25561 Pain in right knee: Secondary | ICD-10-CM | POA: Diagnosis not present

## 2019-09-24 DIAGNOSIS — Z96651 Presence of right artificial knee joint: Secondary | ICD-10-CM | POA: Diagnosis not present

## 2019-09-24 DIAGNOSIS — M25561 Pain in right knee: Secondary | ICD-10-CM | POA: Diagnosis not present

## 2019-10-16 DIAGNOSIS — H269 Unspecified cataract: Secondary | ICD-10-CM

## 2019-10-16 HISTORY — DX: Unspecified cataract: H26.9

## 2019-10-19 DIAGNOSIS — H2512 Age-related nuclear cataract, left eye: Secondary | ICD-10-CM | POA: Diagnosis not present

## 2019-10-19 DIAGNOSIS — H25013 Cortical age-related cataract, bilateral: Secondary | ICD-10-CM | POA: Diagnosis not present

## 2019-10-19 DIAGNOSIS — H25043 Posterior subcapsular polar age-related cataract, bilateral: Secondary | ICD-10-CM | POA: Diagnosis not present

## 2019-10-19 DIAGNOSIS — H2513 Age-related nuclear cataract, bilateral: Secondary | ICD-10-CM | POA: Diagnosis not present

## 2019-10-19 DIAGNOSIS — H401132 Primary open-angle glaucoma, bilateral, moderate stage: Secondary | ICD-10-CM | POA: Diagnosis not present

## 2019-11-12 DIAGNOSIS — H2512 Age-related nuclear cataract, left eye: Secondary | ICD-10-CM | POA: Diagnosis not present

## 2019-11-12 DIAGNOSIS — H401122 Primary open-angle glaucoma, left eye, moderate stage: Secondary | ICD-10-CM | POA: Diagnosis not present

## 2019-11-13 DIAGNOSIS — H2511 Age-related nuclear cataract, right eye: Secondary | ICD-10-CM | POA: Diagnosis not present

## 2019-11-13 DIAGNOSIS — H25041 Posterior subcapsular polar age-related cataract, right eye: Secondary | ICD-10-CM | POA: Diagnosis not present

## 2019-11-19 ENCOUNTER — Other Ambulatory Visit: Payer: Medicare Other

## 2019-11-23 ENCOUNTER — Ambulatory Visit: Payer: Medicare Other | Attending: Internal Medicine

## 2019-11-23 DIAGNOSIS — Z20822 Contact with and (suspected) exposure to covid-19: Secondary | ICD-10-CM

## 2019-11-24 LAB — NOVEL CORONAVIRUS, NAA: SARS-CoV-2, NAA: NOT DETECTED

## 2019-12-06 ENCOUNTER — Ambulatory Visit: Payer: Medicare Other

## 2019-12-06 ENCOUNTER — Ambulatory Visit: Payer: Medicare Other | Attending: Internal Medicine

## 2019-12-06 DIAGNOSIS — Z23 Encounter for immunization: Secondary | ICD-10-CM

## 2019-12-06 NOTE — Progress Notes (Signed)
   Covid-19 Vaccination Clinic  Name:  [name]    MRN: [id number] DOB: [birth year]  12/06/2019  [name] was observed post Covid-19 immunization for 15 minutes without incidence. She was provided with Vaccine Information Sheet and instruction to access the V-Safe system.   [name] was instructed to call 911 with any severe reactions post vaccine: [name] Difficulty breathing  . Swelling of your face and throat  . A fast heartbeat  . A bad rash all over your body  . Dizziness and weakness    Immunizations Administered    Name Date Dose VIS Date Route   Pfizer COVID-19 Vaccine 12/06/2019  1:45 PM 0.3 mL 09/25/2019 Intramuscular   Manufacturer: [place]   Lot: [id number]   [place]: [id number]

## 2019-12-10 DIAGNOSIS — H401112 Primary open-angle glaucoma, right eye, moderate stage: Secondary | ICD-10-CM | POA: Diagnosis not present

## 2019-12-10 DIAGNOSIS — H2511 Age-related nuclear cataract, right eye: Secondary | ICD-10-CM | POA: Diagnosis not present

## 2019-12-30 ENCOUNTER — Ambulatory Visit: Payer: Medicare Other | Attending: Internal Medicine

## 2019-12-30 DIAGNOSIS — Z23 Encounter for immunization: Secondary | ICD-10-CM

## 2019-12-30 NOTE — Progress Notes (Signed)
   Covid-19 Vaccination Clinic  Name:  [name]    MRN: [id number] DOB: 1941/04/30  12/30/2019  [name] was observed post Covid-19 immunization for 15 minutes without incident. She was provided with Vaccine Information Sheet and instruction to access the V-Safe system.   [name] was instructed to call 911 with any severe reactions post vaccine: [name] Difficulty breathing  . Swelling of face and throat  . A fast heartbeat  . A bad rash all over body  . Dizziness and weakness   Immunizations Administered    Name Date Dose VIS Date Route   Pfizer COVID-19 Vaccine 12/30/2019  9:32 AM 0.3 mL 09/25/2019 Intramuscular   Manufacturer: [place]   Lot: [id number]   [place]: [id number]

## 2020-01-01 DIAGNOSIS — H43393 Other vitreous opacities, bilateral: Secondary | ICD-10-CM | POA: Diagnosis not present

## 2020-01-01 DIAGNOSIS — H5319 Other subjective visual disturbances: Secondary | ICD-10-CM | POA: Diagnosis not present

## 2020-01-01 DIAGNOSIS — H43823 Vitreomacular adhesion, bilateral: Secondary | ICD-10-CM | POA: Diagnosis not present

## 2020-01-01 DIAGNOSIS — H401133 Primary open-angle glaucoma, bilateral, severe stage: Secondary | ICD-10-CM | POA: Diagnosis not present

## 2020-01-04 DIAGNOSIS — I10 Essential (primary) hypertension: Secondary | ICD-10-CM | POA: Diagnosis not present

## 2020-01-04 DIAGNOSIS — Z79899 Other long term (current) drug therapy: Secondary | ICD-10-CM | POA: Diagnosis not present

## 2020-01-04 DIAGNOSIS — R5383 Other fatigue: Secondary | ICD-10-CM | POA: Diagnosis not present

## 2020-01-04 DIAGNOSIS — E782 Mixed hyperlipidemia: Secondary | ICD-10-CM | POA: Diagnosis not present

## 2020-01-04 DIAGNOSIS — E559 Vitamin D deficiency, unspecified: Secondary | ICD-10-CM | POA: Diagnosis not present

## 2020-01-07 DIAGNOSIS — I10 Essential (primary) hypertension: Secondary | ICD-10-CM | POA: Diagnosis not present

## 2020-01-07 DIAGNOSIS — E782 Mixed hyperlipidemia: Secondary | ICD-10-CM | POA: Diagnosis not present

## 2020-01-07 DIAGNOSIS — E559 Vitamin D deficiency, unspecified: Secondary | ICD-10-CM | POA: Diagnosis not present

## 2020-01-07 DIAGNOSIS — E119 Type 2 diabetes mellitus without complications: Secondary | ICD-10-CM | POA: Diagnosis not present

## 2020-01-13 ENCOUNTER — Telehealth: Payer: Self-pay | Admitting: Pulmonary Disease

## 2020-01-14 NOTE — Telephone Encounter (Signed)
Error

## 2020-02-09 ENCOUNTER — Ambulatory Visit: Payer: Medicare Other | Admitting: Pulmonary Disease

## 2020-02-15 ENCOUNTER — Other Ambulatory Visit: Payer: Self-pay

## 2020-02-15 ENCOUNTER — Encounter: Payer: Self-pay | Admitting: Pulmonary Disease

## 2020-02-15 ENCOUNTER — Ambulatory Visit (INDEPENDENT_AMBULATORY_CARE_PROVIDER_SITE_OTHER): Payer: Medicare Other | Admitting: Pulmonary Disease

## 2020-02-15 VITALS — BP 120/80 | HR 60 | Temp 98.0°F | Ht 70.5 in | Wt 209.8 lb

## 2020-02-15 DIAGNOSIS — G4733 Obstructive sleep apnea (adult) (pediatric): Secondary | ICD-10-CM

## 2020-02-15 NOTE — Progress Notes (Signed)
[name]    VN:[id number]    01/05/1941  Primary Care Physician:[name], Mechele Claude, MD  Referring Physician: Fanny Bien, [place],  [place] [address]  Chief complaint:   Follow up for Chronic cough OSA GERD  HPI: [name] has PMH of hiatal hernia, GERD, reflux, fibromyalgia.  Follow-up for chronic cough.  Non productive in nature, not associated with dyspnea, wheezing. The cough is worse at night and sometimes she wakes up coughing. She has not been tried on any inhales and tessalon perles helped with cough in past. She has significant issues with allergic rhinitis and post nasal drip. She on Singulair, Claritin and nasonex. She has  GERD and hiatal hernia s/p Nissens fundoplication with subsequent take down in 09. Esophageal manometry in Oct 31, 2017 was normal. Follows with Dr. Silverio Decamp   She has H/O OSA with a positive study in 4. On CPAP at home  7/20: [name] is back in clinic after being lost to follow-up since 2017.  Needs an office visit to get CPAP supplies States that she has been using a CPAP regularly with no issues Chronic cough is improved since last visit.  She is on PPI for GERD.  Interim history: Patient reports her cough is well controlled on current treatment plan. She is on PPI for GERD She has been non compliant on her CPAP due to recent dental work and dry mouth.    Outpatient Encounter Medications as of 02/15/2020  Medication Sig  . Albuterol Sulfate (PROAIR RESPICLICK) 123XX123 (90 Base) MCG/ACT AEPB Inhale 2 puffs into the lungs every 6 (six) hours as needed. (Patient taking differently: Inhale 2 puffs into the lungs every 6 (six) hours as needed (for shortness of breath/wheezing). )  . bimatoprost (LUMIGAN) 0.01 % SOLN Place 1 drop into both eyes at bedtime.   . cetirizine (ZYRTEC) 10 MG tablet Take 10 mg by mouth at bedtime.  [name] FLUoxetine (PROZAC) 40 MG capsule Take 40 mg by mouth daily.  [name] losartan (COZAAR) 50 MG  tablet Take 50 mg by mouth daily.   . montelukast (SINGULAIR) 10 MG tablet Take 10 mg by mouth at bedtime.  [name] nystatin (MYCOSTATIN/NYSTOP) powder Apply 1 g topically daily as needed (for rash).   . nystatin ointment (MYCOSTATIN) Apply 1 application topically 2 (two) times daily as needed (for rash).   [name] omeprazole (PRILOSEC) 40 MG capsule Take 40 mg by mouth daily.   . ondansetron (ZOFRAN) 4 MG tablet Take 1 tablet (4 mg total) by mouth every 8 (eight) hours as needed for nausea or vomiting.  . rosuvastatin (CRESTOR) 40 MG tablet Take 40 mg by mouth daily.   . [DISCONTINUED] buPROPion (WELLBUTRIN SR) 150 MG 12 hr tablet Take 150 mg by mouth 2 (two) times daily.  . [DISCONTINUED] FLUoxetine (PROZAC) 20 MG tablet Take 20 mg by mouth daily.   No facility-administered encounter medications on file as of 02/15/2020.   Physical Exam: Blood pressure 130/78, pulse 60, temperature 98.3 F (36.8 C), temperature source Oral, height 5\' 10"  (1.778 m), weight 202 lb 6.4 oz (91.8 kg), SpO2 98 %. Gen:      No acute distress HEENT:  EOMI, sclera anicteric Neck:     No masses; no thyromegaly Lungs:    Clear to auscultation bilaterally; normal respiratory effort CV:         Regular rate and rhythm; no murmurs Abd:      +  bowel sounds; soft, non-tender; no palpable masses, no distension Ext:    No edema; adequate peripheral perfusion Skin:      Warm and dry; no rash Neuro: alert and oriented x 3 Psych: normal mood and affect  Data Reviewed: Barium swallow 08/21/08 Postop changes from a hiatal hernia repair. No evidence for obstruction or contrast extravasation.  Sleep study 10/13/98 Moderate OSA, RDI 20.  Esophageal manometry 11/14/15 Normal relaxation of EG junction, no significant esophageal peristaltic abnormality.  CPAP compliance 05/16/16-06/14/16 100% usage for > 4 hrs AHI 3.8  CPAP compliance 01/16/2020-02/14/2020 17% usage for > 4 hrs AHI 5.3  PFTs 04/19/16 FVC 2.70 (99%] FEV1 2.8  (809%) F/F 86 TLC 82% DLCO 58%. Moderate reduction in diffusion capacity that corrects for alveolar volume  Assessment:  Chronic cough Likely secondary to upper airway syndrome from rhinitis, post nasal drip, hiatal hernia, GERD and acid reflux. This is less likely from asthma and reactive airway disease on PFTs  Her Nissens was taken down in 09 for unclear reasons and she may not be a candidate for a repeat procedure. She has not tolerated chlorpheniramine due to sleepiness.   She is on Losartan which may on occasion cause cough. She had discussed this with her PCP and elected to continue with it as there are not may other options available to treat hypertension. Besides she was taken of losartan in past but this did not help with her cough.  Her cough is well controlled with consistent use of PPI and medications for seasonal allergies.   OSA  Continue CPAP at 10.    Download reviewed.  Encouraged her to use the machine daily.  Plan/Recommendations: - Continue PPI. - Continue CPAP at 10 cm  Tamsen Snider, MD PGY1   Attending note: I have seen and examined the patient. History, labs and imaging reviewed.  [name] is being followed up for chronic cough, upper airway syndrome, OSA States that her breathing is doing well, does not have significant cough at present Has not been using the CPAP well on review of download due to dental procedure and dry mouth Encouraged her to start using her CPAP on a regular basis.  Marshell Garfinkel MD  [facility] 02/15/2020, 9:14 AM   CC: Fanny Bien, MD

## 2020-02-15 NOTE — Patient Instructions (Signed)
Glad you are doing well with regard to your cough Make sure that you start using the CPAP on a regular basis Follow-up in 1 year.

## 2020-02-26 DIAGNOSIS — H43823 Vitreomacular adhesion, bilateral: Secondary | ICD-10-CM | POA: Diagnosis not present

## 2020-02-26 DIAGNOSIS — H43393 Other vitreous opacities, bilateral: Secondary | ICD-10-CM | POA: Diagnosis not present

## 2020-02-26 DIAGNOSIS — H5319 Other subjective visual disturbances: Secondary | ICD-10-CM | POA: Diagnosis not present

## 2020-02-26 DIAGNOSIS — H4312 Vitreous hemorrhage, left eye: Secondary | ICD-10-CM | POA: Diagnosis not present

## 2020-03-15 ENCOUNTER — Other Ambulatory Visit: Payer: Self-pay | Admitting: Sports Medicine

## 2020-03-15 ENCOUNTER — Ambulatory Visit (INDEPENDENT_AMBULATORY_CARE_PROVIDER_SITE_OTHER): Payer: Medicare Other

## 2020-03-15 ENCOUNTER — Other Ambulatory Visit: Payer: Self-pay

## 2020-03-15 ENCOUNTER — Encounter: Payer: Self-pay | Admitting: Sports Medicine

## 2020-03-15 ENCOUNTER — Ambulatory Visit (INDEPENDENT_AMBULATORY_CARE_PROVIDER_SITE_OTHER): Payer: Medicare Other | Admitting: Sports Medicine

## 2020-03-15 VITALS — BP 145/71 | HR 69 | Resp 16

## 2020-03-15 DIAGNOSIS — M76821 Posterior tibial tendinitis, right leg: Secondary | ICD-10-CM

## 2020-03-15 DIAGNOSIS — M79671 Pain in right foot: Secondary | ICD-10-CM

## 2020-03-15 DIAGNOSIS — I739 Peripheral vascular disease, unspecified: Secondary | ICD-10-CM

## 2020-03-15 DIAGNOSIS — M722 Plantar fascial fibromatosis: Secondary | ICD-10-CM

## 2020-03-15 MED ORDER — TRIAMCINOLONE ACETONIDE 10 MG/ML IJ SUSP: 10.0000 mg | Freq: Once | INTRAMUSCULAR | Status: DC

## 2020-03-15 NOTE — Progress Notes (Signed)
Patient ID: [name], female   DOB: 1941/01/17, 79 y.o.   MRN: [id number] Subjective: [name] is a 79 y.o. female patient who returns to office for evaluation of Right foot pain that seems like it has flared up since she had her knee surgery done last year reports that she has been increasing her walking and activity which has made her pain worse especially at the medial ankle has tried massage topical pain gel elevating and icing but still has pain.  Patient denies any other pedal complaints at this time but does admit that she is supposed to get compression stockings to help with swelling and has not been able to get them yet and admits that she has not been doing physical therapy for her knee.  Patient Active Problem List   Diagnosis Date Noted  . Change in bowel habits 08/22/2018  . Knee stiff 07/23/2018  . GI bleed 07/04/2018  . Rectal bleeding   . Decreased hemoglobin   . Preoperative clearance 05/21/2018  . Degeneration of lumbar intervertebral disc 02/04/2018  . Scoliosis of lumbar spine 02/04/2018  . Bradycardia 07/27/2016  . Cough   . Essential hypertension 11/24/2014  . Gonalgia 05/13/2013  . Cellulitis 05/13/2013  . OA (osteoarthritis) of knee 05/13/2013  . Degenerative joint disease involving multiple joints 01/19/2013  . Rheumatoid arthritis ([place]) 01/19/2013  . Thyroid nodule 06/11/2012  . Thrombocytopenia ([place])   . Elevated total protein   . Hypercalcemia   . Cyst of thyroid 10/29/2011  . Hemorrhoids, internal 05/28/2011  . Acid reflux 02/14/2011  . BP (high blood pressure) 02/14/2011  . Avitaminosis D 02/13/2011  . Glaucoma 11/28/2010  . HYPERLIPIDEMIA 12/14/2009  . Internal and external bleeding hemorrhoids 12/14/2009  . DIVERTICULOSIS, COLON 12/14/2009  . Joint pain 12/14/2009  . BACK PAIN, CHRONIC 12/14/2009  . FIBROMYALGIA 12/14/2009  . OSTEOPOROSIS 12/14/2009  . SLEEP APNEA 12/14/2009  . COLONIC POLYPS, ADENOMATOUS, HX OF 12/14/2009  . GASTRIC  POLYP, HX OF 12/14/2009  . HIATAL HERNIA 10/04/2009  . DEPRESSION 09/29/2009  . REFLUX ESOPHAGITIS 09/29/2009  . GERD 09/29/2009  . Constipation 09/29/2009  . DYSPHAGIA 09/29/2009   Current Outpatient Medications on File Prior to Visit  Medication Sig Dispense Refill  . Albuterol Sulfate (PROAIR RESPICLICK) 123XX123 (90 Base) MCG/ACT AEPB Inhale 2 puffs into the lungs every 6 (six) hours as needed. (Patient taking differently: Inhale 2 puffs into the lungs every 6 (six) hours as needed (for shortness of breath/wheezing). ) 1 each 5  . bimatoprost (LUMIGAN) 0.01 % SOLN Place 1 drop into both eyes at bedtime.     . cetirizine (ZYRTEC) 10 MG tablet Take 10 mg by mouth at bedtime.    . famotidine (PEPCID) 40 MG tablet Take 40 mg by mouth daily.    [name] FLUoxetine (PROZAC) 40 MG capsule Take 40 mg by mouth daily.    [name] losartan (COZAAR) 50 MG tablet Take 50 mg by mouth daily.     . montelukast (SINGULAIR) 10 MG tablet Take 10 mg by mouth at bedtime.    [name] nystatin (MYCOSTATIN/NYSTOP) powder Apply 1 g topically daily as needed (for rash).   3  . nystatin ointment (MYCOSTATIN) Apply 1 application topically 2 (two) times daily as needed (for rash).   0  . omeprazole (PRILOSEC) 40 MG capsule Take 40 mg by mouth daily.     . ondansetron (ZOFRAN) 4 MG tablet Take 1 tablet (4 mg total) by mouth every 8 (eight) hours as needed for  nausea or vomiting. 60 tablet 1  . rosuvastatin (CRESTOR) 40 MG tablet Take 40 mg by mouth daily.      No current facility-administered medications on file prior to visit.   Allergies  Allergen Reactions  . Adalat [Nifedipine] Other (See Comments)    Unknown  . Hydrocodone Itching and Nausea And Vomiting  . Meperidine Hcl Other (See Comments)    Demerol causes hallucinations  . Naproxen Nausea And Vomiting    Objective:  General: Alert and oriented x3 in no acute distress  Dermatology: No open lesions bilateral lower extremities, no webspace macerations, no ecchymosis  bilateral, all nails x 10 are well manicured.  Vascular: Dorsalis Pedis 1/4 and Posterior Tibial pedal pulses 2/4, Capillary Fill Time 3 seconds,(+) pedal hair growth bilateral, no edema bilateral lower extremities, + hyperpigmentation and varicosities bilateral, Temperature gradient within normal limits.  Neurology: Johney Maine sensation intact via light touch bilateral.   Musculoskeletal:There is moderate pain with palpation to posterior tibial tendon right foot, no other pains with range of motion, pes planus foot type. Strength within normal limits in all groups bilateral.   Assessment and Plan: Problem List Items Addressed This Visit    None    Visit Diagnoses    Posterior tibial tendinitis of right lower extremity    -  Primary   Right foot pain       PVD (peripheral vascular disease) ([place])         -Complete examination performed -Discussed treatement options for PT tendonitis on right After oral consent and aseptic prep, injected a mixture containing 1 ml of 2%  plain lidocaine, 1 ml 0.5% plain marcaine, 0.5 ml of kenalog 10 and 0.5 ml of dexamethasone phosphate into right PT tendon course without complication. Post-injection care discussed with patient.  -Rx ankle gauntlet brace for the right -Continue gentle stretching exercises as instructed -Recommend ice and good supportive shoes daily with arch support -Continue with activities to tolerance -Advised patient to get compression garments as recommended by PCP for varicose veins -Patient to return to office as needed  or sooner if condition worsens.  Landis Martins, DPM

## 2020-04-27 DIAGNOSIS — E559 Vitamin D deficiency, unspecified: Secondary | ICD-10-CM | POA: Diagnosis not present

## 2020-04-27 DIAGNOSIS — Z0000 Encounter for general adult medical examination without abnormal findings: Secondary | ICD-10-CM | POA: Diagnosis not present

## 2020-04-27 DIAGNOSIS — I10 Essential (primary) hypertension: Secondary | ICD-10-CM | POA: Diagnosis not present

## 2020-04-27 DIAGNOSIS — E875 Hyperkalemia: Secondary | ICD-10-CM | POA: Diagnosis not present

## 2020-04-27 DIAGNOSIS — Z1339 Encounter for screening examination for other mental health and behavioral disorders: Secondary | ICD-10-CM | POA: Diagnosis not present

## 2020-04-27 DIAGNOSIS — Z1331 Encounter for screening for depression: Secondary | ICD-10-CM | POA: Diagnosis not present

## 2020-05-20 DIAGNOSIS — Z96659 Presence of unspecified artificial knee joint: Secondary | ICD-10-CM | POA: Diagnosis not present

## 2020-05-20 DIAGNOSIS — Z96651 Presence of right artificial knee joint: Secondary | ICD-10-CM | POA: Diagnosis not present

## 2020-05-20 DIAGNOSIS — Z471 Aftercare following joint replacement surgery: Secondary | ICD-10-CM | POA: Diagnosis not present

## 2020-05-21 ENCOUNTER — Other Ambulatory Visit (HOSPITAL_COMMUNITY): Payer: Self-pay | Admitting: Student

## 2020-05-21 ENCOUNTER — Other Ambulatory Visit: Payer: Self-pay | Admitting: Student

## 2020-05-21 DIAGNOSIS — Z96651 Presence of right artificial knee joint: Secondary | ICD-10-CM

## 2020-05-30 ENCOUNTER — Ambulatory Visit (HOSPITAL_COMMUNITY): Payer: Medicare Other

## 2020-05-30 ENCOUNTER — Encounter (HOSPITAL_COMMUNITY): Payer: Medicare Other

## 2020-06-02 ENCOUNTER — Encounter (HOSPITAL_COMMUNITY)
Admission: RE | Admit: 2020-06-02 | Discharge: 2020-06-02 | Disposition: A | Discharge status: Home / Self Care | Payer: Medicare Other | Source: Ambulatory Visit | Attending: Student | Admitting: Student

## 2020-06-02 ENCOUNTER — Other Ambulatory Visit: Payer: Self-pay

## 2020-06-02 DIAGNOSIS — Z471 Aftercare following joint replacement surgery: Secondary | ICD-10-CM | POA: Diagnosis not present

## 2020-06-02 DIAGNOSIS — Z96651 Presence of right artificial knee joint: Secondary | ICD-10-CM | POA: Diagnosis not present

## 2020-06-02 DIAGNOSIS — M25561 Pain in right knee: Secondary | ICD-10-CM | POA: Diagnosis not present

## 2020-06-02 MED ORDER — TECHNETIUM TC 99M MEDRONATE IV KIT
20.0000 | PACK | Freq: Once | INTRAVENOUS | Status: AC | PRN
  Administered 2020-06-02: 20 via INTRAVENOUS

## 2020-06-13 DIAGNOSIS — B3789 Other sites of candidiasis: Secondary | ICD-10-CM | POA: Diagnosis not present

## 2020-06-13 DIAGNOSIS — M545 Low back pain: Secondary | ICD-10-CM | POA: Diagnosis not present

## 2020-06-13 DIAGNOSIS — B372 Candidiasis of skin and nail: Secondary | ICD-10-CM | POA: Diagnosis not present

## 2020-06-13 DIAGNOSIS — M1990 Unspecified osteoarthritis, unspecified site: Secondary | ICD-10-CM | POA: Diagnosis not present

## 2020-06-13 DIAGNOSIS — I10 Essential (primary) hypertension: Secondary | ICD-10-CM | POA: Diagnosis not present

## 2020-06-13 DIAGNOSIS — K219 Gastro-esophageal reflux disease without esophagitis: Secondary | ICD-10-CM | POA: Diagnosis not present

## 2020-06-15 DIAGNOSIS — Z96651 Presence of right artificial knee joint: Secondary | ICD-10-CM | POA: Diagnosis not present

## 2020-06-24 ENCOUNTER — Other Ambulatory Visit: Payer: Self-pay | Admitting: Family Medicine

## 2020-06-24 DIAGNOSIS — R5381 Other malaise: Secondary | ICD-10-CM

## 2020-06-28 ENCOUNTER — Other Ambulatory Visit: Payer: Self-pay | Admitting: Family Medicine

## 2020-06-28 DIAGNOSIS — Z1231 Encounter for screening mammogram for malignant neoplasm of breast: Secondary | ICD-10-CM

## 2020-06-28 DIAGNOSIS — E2839 Other primary ovarian failure: Secondary | ICD-10-CM

## 2020-07-01 DIAGNOSIS — H59032 Cystoid macular edema following cataract surgery, left eye: Secondary | ICD-10-CM | POA: Diagnosis not present

## 2020-07-01 DIAGNOSIS — H401133 Primary open-angle glaucoma, bilateral, severe stage: Secondary | ICD-10-CM | POA: Diagnosis not present

## 2020-07-01 DIAGNOSIS — H4312 Vitreous hemorrhage, left eye: Secondary | ICD-10-CM | POA: Diagnosis not present

## 2020-07-06 DIAGNOSIS — M25861 Other specified joint disorders, right knee: Secondary | ICD-10-CM | POA: Diagnosis not present

## 2020-07-06 DIAGNOSIS — Z96651 Presence of right artificial knee joint: Secondary | ICD-10-CM | POA: Diagnosis not present

## 2020-07-06 DIAGNOSIS — M67261 Synovial hypertrophy, not elsewhere classified, right lower leg: Secondary | ICD-10-CM | POA: Diagnosis not present

## 2020-07-06 DIAGNOSIS — M659 Synovitis and tenosynovitis, unspecified: Secondary | ICD-10-CM | POA: Diagnosis not present

## 2020-07-15 DIAGNOSIS — Z23 Encounter for immunization: Secondary | ICD-10-CM | POA: Diagnosis not present

## 2020-07-22 DIAGNOSIS — R0602 Shortness of breath: Secondary | ICD-10-CM | POA: Diagnosis not present

## 2020-07-22 DIAGNOSIS — E559 Vitamin D deficiency, unspecified: Secondary | ICD-10-CM | POA: Diagnosis not present

## 2020-07-22 DIAGNOSIS — E1165 Type 2 diabetes mellitus with hyperglycemia: Secondary | ICD-10-CM | POA: Diagnosis not present

## 2020-07-22 DIAGNOSIS — E782 Mixed hyperlipidemia: Secondary | ICD-10-CM | POA: Diagnosis not present

## 2020-07-23 ENCOUNTER — Other Ambulatory Visit: Payer: Self-pay

## 2020-07-23 DIAGNOSIS — R0609 Other forms of dyspnea: Principal | ICD-10-CM | POA: Insufficient documentation

## 2020-07-23 DIAGNOSIS — K449 Diaphragmatic hernia without obstruction or gangrene: Secondary | ICD-10-CM | POA: Diagnosis not present

## 2020-07-23 DIAGNOSIS — I517 Cardiomegaly: Secondary | ICD-10-CM | POA: Diagnosis not present

## 2020-07-23 DIAGNOSIS — R0789 Other chest pain: Secondary | ICD-10-CM | POA: Diagnosis not present

## 2020-07-23 DIAGNOSIS — Z96653 Presence of artificial knee joint, bilateral: Secondary | ICD-10-CM | POA: Diagnosis not present

## 2020-07-23 DIAGNOSIS — I200 Unstable angina: Secondary | ICD-10-CM | POA: Diagnosis not present

## 2020-07-23 DIAGNOSIS — Z79899 Other long term (current) drug therapy: Secondary | ICD-10-CM | POA: Diagnosis not present

## 2020-07-23 DIAGNOSIS — J45909 Unspecified asthma, uncomplicated: Secondary | ICD-10-CM | POA: Diagnosis not present

## 2020-07-23 DIAGNOSIS — Z7901 Long term (current) use of anticoagulants: Secondary | ICD-10-CM | POA: Diagnosis not present

## 2020-07-23 DIAGNOSIS — Z20822 Contact with and (suspected) exposure to covid-19: Secondary | ICD-10-CM | POA: Insufficient documentation

## 2020-07-23 DIAGNOSIS — R001 Bradycardia, unspecified: Secondary | ICD-10-CM | POA: Insufficient documentation

## 2020-07-23 DIAGNOSIS — I10 Essential (primary) hypertension: Secondary | ICD-10-CM | POA: Insufficient documentation

## 2020-07-23 DIAGNOSIS — R079 Chest pain, unspecified: Secondary | ICD-10-CM | POA: Diagnosis not present

## 2020-07-23 DIAGNOSIS — R0602 Shortness of breath: Secondary | ICD-10-CM | POA: Diagnosis not present

## 2020-07-24 ENCOUNTER — Emergency Department (HOSPITAL_COMMUNITY): Payer: Medicare Other

## 2020-07-24 ENCOUNTER — Encounter (HOSPITAL_COMMUNITY): Payer: Self-pay | Admitting: Emergency Medicine

## 2020-07-24 ENCOUNTER — Observation Stay (HOSPITAL_BASED_OUTPATIENT_CLINIC_OR_DEPARTMENT_OTHER): Payer: Medicare Other

## 2020-07-24 ENCOUNTER — Observation Stay (HOSPITAL_COMMUNITY)
Admission: EM | Admit: 2020-07-24 | Discharge: 2020-07-25 | Disposition: A | Discharge status: Home / Self Care | Payer: Medicare Other | Attending: Internal Medicine | Admitting: Internal Medicine

## 2020-07-24 DIAGNOSIS — K219 Gastro-esophageal reflux disease without esophagitis: Secondary | ICD-10-CM | POA: Diagnosis not present

## 2020-07-24 DIAGNOSIS — I10 Essential (primary) hypertension: Secondary | ICD-10-CM | POA: Diagnosis not present

## 2020-07-24 DIAGNOSIS — I517 Cardiomegaly: Secondary | ICD-10-CM | POA: Diagnosis not present

## 2020-07-24 DIAGNOSIS — R0789 Other chest pain: Secondary | ICD-10-CM | POA: Diagnosis not present

## 2020-07-24 DIAGNOSIS — R001 Bradycardia, unspecified: Secondary | ICD-10-CM | POA: Diagnosis not present

## 2020-07-24 DIAGNOSIS — R7989 Other specified abnormal findings of blood chemistry: Secondary | ICD-10-CM

## 2020-07-24 DIAGNOSIS — F32A Depression, unspecified: Secondary | ICD-10-CM | POA: Diagnosis present

## 2020-07-24 DIAGNOSIS — Z7901 Long term (current) use of anticoagulants: Secondary | ICD-10-CM

## 2020-07-24 DIAGNOSIS — R0600 Dyspnea, unspecified: Secondary | ICD-10-CM | POA: Diagnosis not present

## 2020-07-24 DIAGNOSIS — R0602 Shortness of breath: Secondary | ICD-10-CM | POA: Diagnosis not present

## 2020-07-24 DIAGNOSIS — R0609 Other forms of dyspnea: Secondary | ICD-10-CM | POA: Diagnosis not present

## 2020-07-24 DIAGNOSIS — I200 Unstable angina: Secondary | ICD-10-CM

## 2020-07-24 DIAGNOSIS — R079 Chest pain, unspecified: Secondary | ICD-10-CM | POA: Diagnosis not present

## 2020-07-24 DIAGNOSIS — E785 Hyperlipidemia, unspecified: Secondary | ICD-10-CM | POA: Diagnosis not present

## 2020-07-24 DIAGNOSIS — K449 Diaphragmatic hernia without obstruction or gangrene: Secondary | ICD-10-CM | POA: Diagnosis not present

## 2020-07-24 LAB — CBC WITH DIFFERENTIAL/PLATELET
Abs Immature Granulocytes: 0 10*3/uL (ref 0.00–0.07)
Basophils Absolute: 0 10*3/uL (ref 0.0–0.1)
Basophils Relative: 1 %
Eosinophils Absolute: 0.3 10*3/uL (ref 0.0–0.5)
Eosinophils Relative: 7 %
HCT: 41.3 % (ref 36.0–46.0)
Hemoglobin: 13.4 g/dL (ref 12.0–15.0)
Immature Granulocytes: 0 %
Lymphocytes Relative: 42 %
Lymphs Abs: 2.1 10*3/uL (ref 0.7–4.0)
MCH: 31.5 pg (ref 26.0–34.0)
MCHC: 32.4 g/dL (ref 30.0–36.0)
MCV: 97.2 fL (ref 80.0–100.0)
Monocytes Absolute: 0.5 10*3/uL (ref 0.1–1.0)
Monocytes Relative: 10 %
Neutro Abs: 2 10*3/uL (ref 1.7–7.7)
Neutrophils Relative %: 40 %
Platelets: 165 10*3/uL (ref 150–400)
RBC: 4.25 MIL/uL (ref 3.87–5.11)
RDW: 13.2 % (ref 11.5–15.5)
WBC: 4.9 10*3/uL (ref 4.0–10.5)
nRBC: 0 % (ref 0.0–0.2)

## 2020-07-24 LAB — COMPREHENSIVE METABOLIC PANEL WITH GFR
ALT: 13 U/L (ref 0–44)
AST: 19 U/L (ref 15–41)
Albumin: 4.1 g/dL (ref 3.5–5.0)
Alkaline Phosphatase: 73 U/L (ref 38–126)
Anion gap: 10 (ref 5–15)
BUN: 20 mg/dL (ref 8–23)
CO2: 26 mmol/L (ref 22–32)
Calcium: 9.4 mg/dL (ref 8.9–10.3)
Chloride: 103 mmol/L (ref 98–111)
Creatinine, Ser: 0.99 mg/dL (ref 0.44–1.00)
GFR, Estimated: 55 mL/min — ABNORMAL LOW
Glucose, Bld: 93 mg/dL (ref 70–99)
Potassium: 3.9 mmol/L (ref 3.5–5.1)
Sodium: 139 mmol/L (ref 135–145)
Total Bilirubin: 0.7 mg/dL (ref 0.3–1.2)
Total Protein: 7.6 g/dL (ref 6.5–8.1)

## 2020-07-24 LAB — TSH: TSH: 1.343 u[IU]/mL (ref 0.350–4.500)

## 2020-07-24 LAB — LIPID PANEL
Cholesterol: 218 mg/dL — ABNORMAL HIGH (ref 0–200)
HDL: 68 mg/dL (ref >40)
LDL Cholesterol: 137 mg/dL — ABNORMAL HIGH (ref 0–99)
Total CHOL/HDL Ratio: 3.2 RATIO
Triglycerides: 66 mg/dL (ref <150)
VLDL: 13 mg/dL (ref 0–40)

## 2020-07-24 LAB — ECHOCARDIOGRAM COMPLETE
Area-P 1/2: 1.62 cm2
Height: 70 in
S' Lateral: 2.4 cm
Weight: 3200 oz

## 2020-07-24 LAB — RESPIRATORY PANEL BY RT PCR (FLU A&B, COVID)
Influenza A by PCR: NEGATIVE
Influenza B by PCR: NEGATIVE
SARS Coronavirus 2 by RT PCR: NEGATIVE

## 2020-07-24 LAB — BRAIN NATRIURETIC PEPTIDE: B Natriuretic Peptide: 88.8 pg/mL (ref 0.0–100.0)

## 2020-07-24 LAB — SEDIMENTATION RATE: Sed Rate: 15 mm/hr (ref 0–22)

## 2020-07-24 LAB — TROPONIN I (HIGH SENSITIVITY)
Troponin I (High Sensitivity): 5 ng/L (ref <18)
Troponin I (High Sensitivity): 7 ng/L (ref <18)

## 2020-07-24 MED ORDER — SODIUM CHLORIDE (PF) 0.9 % IJ SOLN
INTRAMUSCULAR | Status: AC
  Filled 2020-07-24: qty 50

## 2020-07-24 MED ORDER — PANTOPRAZOLE SODIUM 40 MG PO TBEC
40.0000 mg | DELAYED_RELEASE_TABLET | Freq: Every day | ORAL | Status: DC
  Administered 2020-07-24 – 2020-07-25 (×2): 40 mg via ORAL
  Filled 2020-07-24 (×2): qty 1

## 2020-07-24 MED ORDER — LOSARTAN POTASSIUM 50 MG PO TABS
50.0000 mg | ORAL_TABLET | Freq: Every day | ORAL | Status: DC
  Administered 2020-07-24 – 2020-07-25 (×2): 50 mg via ORAL
  Filled 2020-07-24 (×2): qty 1

## 2020-07-24 MED ORDER — BRIMONIDINE TARTRATE 0.15 % OP SOLN
1.0000 [drp] | Freq: Three times a day (TID) | OPHTHALMIC | Status: DC
  Administered 2020-07-24 – 2020-07-25 (×2): 1 [drp] via OPHTHALMIC
  Filled 2020-07-24: qty 5

## 2020-07-24 MED ORDER — ONDANSETRON HCL 4 MG/2ML IJ SOLN: 4.0000 mg | Freq: Four times a day (QID) | INTRAMUSCULAR | Status: DC | PRN

## 2020-07-24 MED ORDER — ROSUVASTATIN CALCIUM 20 MG PO TABS
40.0000 mg | ORAL_TABLET | Freq: Every day | ORAL | Status: DC
  Administered 2020-07-24 – 2020-07-25 (×2): 40 mg via ORAL
  Filled 2020-07-24 (×2): qty 2

## 2020-07-24 MED ORDER — HYDRALAZINE HCL 25 MG PO TABS: 25.0000 mg | ORAL_TABLET | Freq: Three times a day (TID) | ORAL | Status: DC | PRN

## 2020-07-24 MED ORDER — MONTELUKAST SODIUM 10 MG PO TABS
10.0000 mg | ORAL_TABLET | Freq: Every day | ORAL | Status: DC
  Administered 2020-07-24: 10 mg via ORAL
  Filled 2020-07-24: qty 1

## 2020-07-24 MED ORDER — IOHEXOL 350 MG/ML SOLN
100.0000 mL | Freq: Once | INTRAVENOUS | Status: AC | PRN
  Administered 2020-07-24: 100 mL via INTRAVENOUS

## 2020-07-24 MED ORDER — LORATADINE 10 MG PO TABS
10.0000 mg | ORAL_TABLET | Freq: Every day | ORAL | Status: DC
  Administered 2020-07-24 – 2020-07-25 (×2): 10 mg via ORAL
  Filled 2020-07-24 (×2): qty 1

## 2020-07-24 MED ORDER — FAMOTIDINE 20 MG PO TABS
40.0000 mg | ORAL_TABLET | Freq: Every day | ORAL | Status: DC
  Administered 2020-07-24 – 2020-07-25 (×2): 40 mg via ORAL
  Filled 2020-07-24 (×2): qty 2

## 2020-07-24 MED ORDER — RIVAROXABAN 15 MG PO TABS
15.0000 mg | ORAL_TABLET | Freq: Two times a day (BID) | ORAL | Status: DC
  Administered 2020-07-24: 15 mg via ORAL
  Filled 2020-07-24: qty 1

## 2020-07-24 MED ORDER — ACETAMINOPHEN 325 MG PO TABS: 650.0000 mg | ORAL_TABLET | ORAL | Status: DC | PRN

## 2020-07-24 MED ORDER — DORZOLAMIDE HCL-TIMOLOL MAL 2-0.5 % OP SOLN: 1.0000 [drp] | Freq: Two times a day (BID) | OPHTHALMIC | Status: DC

## 2020-07-24 MED ORDER — LATANOPROST 0.005 % OP SOLN: 1.0000 [drp] | Freq: Every day | OPHTHALMIC | Status: DC

## 2020-07-24 MED ORDER — FLUOXETINE HCL 20 MG PO CAPS
40.0000 mg | ORAL_CAPSULE | Freq: Every day | ORAL | Status: DC
  Administered 2020-07-24 – 2020-07-25 (×2): 40 mg via ORAL
  Filled 2020-07-24: qty 2

## 2020-07-24 NOTE — ED Provider Notes (Addendum)
[facility] DEPT Provider Note   CSN: [id number] Arrival date & time: 07/23/20  2336     History Chief Complaint  Patient presents with  . Shortness of Breath  . Chest Pain    Pace is a 79 y.o. female.  Patient with history of HTN, HLD, bradycardia, fibromyalgia, glaucoma, GERD, sleep apnea presents with exertional SOB x 1-2 days associated with nonproductive cough, persistent right sided chest tightness. No fever, nausea, vomiting. She saw her PCP at onset of symptoms and reports her labs at that time showed an elevated d-dimer. She was started on Xarelto with plan to image after the weekend.   The history is provided by the patient. No language interpreter was used.  Shortness of Breath Associated symptoms: chest pain   Associated symptoms: no abdominal pain, no fever, no headaches, no rash and no vomiting   Chest Pain Associated symptoms: shortness of breath and weakness   Associated symptoms: no abdominal pain, no fever, no headache, no nausea and no vomiting        Past Medical History:  Diagnosis Date  . Anal or rectal pain    sometimes  . Anemia    hx of during pregnancy  . Arthritis   . Asthma    hx of  . Bradycardia    " I KNOW I HAVE BRADYCARDIA ESPECIALLY WHEN I SLEEP"   . Chronic back pain   . Degenerative joint disease    osteo  . Depression   . Diverticulosis 2003  . Dysrhythmia    hx of  due to eye drop and also low heart rate 40's per pt. Dr. Gwenlyn Found follows  . Elevated total protein   . Esophageal dysmotility   . Fibromyalgia   . GERD (gastroesophageal reflux disease)    subsequent Nissen Fundoplication  . Glaucoma   . Hearing loss   . Hemorrhoids   . Hiatal hernia 11/08/09  . Hx of adenomatous colonic polyps 07/02/02  . Hypercalcemia   . Hyperlipidemia   . Hyperlipidemia   . Hypertension   . Nausea   . Osteoporosis   . PONV (postoperative nausea and vomiting)   . Rectal bleeding    from hemorrhoids.     . Sleep apnea    DOES USE CPAP   . Thrombocytopenia ([place])   . Varicose veins of left lower extremity     Patient Active Problem List   Diagnosis Date Noted  . Change in bowel habits 08/22/2018  . Knee stiff 07/23/2018  . GI bleed 07/04/2018  . Rectal bleeding   . Decreased hemoglobin   . Preoperative clearance 05/21/2018  . Degeneration of lumbar intervertebral disc 02/04/2018  . Scoliosis of lumbar spine 02/04/2018  . Bradycardia 07/27/2016  . Cough   . Essential hypertension 11/24/2014  . Gonalgia 05/13/2013  . Cellulitis 05/13/2013  . OA (osteoarthritis) of knee 05/13/2013  . Degenerative joint disease involving multiple joints 01/19/2013  . Rheumatoid arthritis ([place]) 01/19/2013  . Thyroid nodule 06/11/2012  . Thrombocytopenia ([place])   . Elevated total protein   . Hypercalcemia   . Cyst of thyroid 10/29/2011  . Hemorrhoids, internal 05/28/2011  . Acid reflux 02/14/2011  . BP (high blood pressure) 02/14/2011  . Avitaminosis D 02/13/2011  . Glaucoma 11/28/2010  . HYPERLIPIDEMIA 12/14/2009  . Internal and external bleeding hemorrhoids 12/14/2009  . DIVERTICULOSIS, COLON 12/14/2009  . Joint pain 12/14/2009  . BACK PAIN, CHRONIC 12/14/2009  . FIBROMYALGIA 12/14/2009  . OSTEOPOROSIS 12/14/2009  .  SLEEP APNEA 12/14/2009  . COLONIC POLYPS, ADENOMATOUS, HX OF 12/14/2009  . GASTRIC POLYP, HX OF 12/14/2009  . HIATAL HERNIA 10/04/2009  . DEPRESSION 09/29/2009  . REFLUX ESOPHAGITIS 09/29/2009  . GERD 09/29/2009  . Constipation 09/29/2009  . DYSPHAGIA 09/29/2009    Past Surgical History:  Procedure Laterality Date  . CARDIAC CATHETERIZATION  03/14/1992   Normal cardiac cath. Normal LV function.  [name] CARDIOVASCULAR STRESS TEST  01/22/2011   No scintigraphic evidence of inducible ischemia.  [name] CAROTID DOPPLER  03/31/2007   Bilateral ICAs - no evidence of significant diameter reduction, dissectin, tortuosity, FMD, or any other vascular abnormality.  . ESOPHAGEAL MANOMETRY N/A  11/14/2015   Procedure: ESOPHAGEAL MANOMETRY (EM);  Surgeon: Mauri Pole, MD;  Location: [facility];  Service: Endoscopy;  Laterality: N/A;  . GASTRIC RESECTION  2009  . JOINT REPLACEMENT     right knee Dr. Wynelle Link 06-23-18  . NISSEN FUNDOPLICATION  2505   with subsequent takedown in 2009  . TOTAL KNEE ARTHROPLASTY Left 06/10/2017   Procedure: LEFT  TOTAL KNEE ARTHROPLASTY;  Surgeon: Gaynelle Arabian, MD;  Location: [facility];  Service: Orthopedics;  Laterality: Left;  Adductor Block  . TOTAL KNEE ARTHROPLASTY Right 06/23/2018   Procedure: RIGHT TOTAL KNEE ARTHROPLASTY;  Surgeon: Gaynelle Arabian, MD;  Location: [facility];  Service: Orthopedics;  Laterality: Right;  . TRANSTHORACIC ECHOCARDIOGRAM  12/21/2010   EF 60%, moderate LVH,      OB History   No obstetric history on file.     Family History  Problem Relation Age of Onset  . Heart disease Mother   . Hypertension Mother   . Diabetes Mother   . Diabetes Father   . Kidney disease Father   . Breast cancer Daughter   . Stomach cancer Maternal Aunt   . Leukemia Maternal Uncle   . Prostate cancer Maternal Uncle   . Stroke Brother   . Cancer Maternal Grandmother   . Kidney disease Maternal Grandfather   . Uterine cancer Maternal Aunt   . Colon cancer Neg Hx     Social History   Tobacco Use  . Smoking status: Never Smoker  . Smokeless tobacco: Never Used  Vaping Use  . Vaping Use: Never used  Substance Use Topics  . Alcohol use: No    Alcohol/week: 0.0 standard drinks  . Drug use: No    Home Medications Prior to Admission medications   Medication Sig Start Date End Date Taking? Authorizing Provider  Albuterol Sulfate (PROAIR RESPICLICK) 397 (90 Base) MCG/ACT AEPB Inhale 2 puffs into the lungs every 6 (six) hours as needed. Patient taking differently: Inhale 2 puffs into the lungs every 6 (six) hours as needed (for shortness of breath/wheezing).  05/02/16   Mannam, Praveen, MD  bimatoprost (LUMIGAN) 0.01 % SOLN Place 1 drop  into both eyes at bedtime.     [provider]  cetirizine (ZYRTEC) 10 MG tablet Take 10 mg by mouth at bedtime.    [provider]  famotidine (PEPCID) 40 MG tablet Take 40 mg by mouth daily. 03/06/20   [provider]  FLUoxetine (PROZAC) 40 MG capsule Take 40 mg by mouth daily. 12/10/19   [provider]  losartan (COZAAR) 50 MG tablet Take 50 mg by mouth daily.     [provider]  montelukast (SINGULAIR) 10 MG tablet Take 10 mg by mouth at bedtime.    [provider]  nystatin (MYCOSTATIN/NYSTOP) powder Apply 1 g topically daily as needed (for  rash).  11/20/16   [provider]  nystatin ointment (MYCOSTATIN) Apply 1 application topically 2 (two) times daily as needed (for rash).  11/20/16   [provider]  omeprazole (PRILOSEC) 40 MG capsule Take 40 mg by mouth daily.     [provider]  ondansetron (ZOFRAN) 4 MG tablet Take 1 tablet (4 mg total) by mouth every 8 (eight) hours as needed for nausea or vomiting. 07/30/17   Mauri Pole, MD  rosuvastatin (CRESTOR) 40 MG tablet Take 40 mg by mouth daily.     [provider]    Allergies    Adalat [nifedipine], Hydrocodone, Meperidine hcl, and Naproxen  Review of Systems   Review of Systems  Constitutional: Negative for chills and fever.  HENT: Negative.   Eyes: Negative for visual disturbance.  Respiratory: Positive for chest tightness and shortness of breath.   Cardiovascular: Positive for chest pain.  Gastrointestinal: Negative.  Negative for abdominal pain, nausea and vomiting.  Genitourinary: Negative.  Negative for dysuria.  Musculoskeletal: Negative.  Negative for myalgias.  Skin: Negative.  Negative for color change and rash.  Neurological: Positive for weakness. Negative for syncope, light-headedness and headaches.  Psychiatric/Behavioral: Negative for confusion.    Physical Exam Updated Vital Signs BP (!) 170/77 (BP Location: Left  Arm)   Pulse (!) 51   Temp 98.1 F (36.7 C) (Oral)   Resp 20   Ht 5\' 10"  (1.778 m)   Wt 90.7 kg   SpO2 100%   BMI 28.70 kg/m   Physical Exam Vitals and nursing note reviewed.  Constitutional:      Appearance: She is well-developed.  HENT:     Head: Normocephalic.  Cardiovascular:     Rate and Rhythm: Regular rhythm. Bradycardia present.     Heart sounds: No murmur heard.   Pulmonary:     Effort: Pulmonary effort is normal.     Breath sounds: Normal breath sounds. No wheezing, rhonchi or rales.  Abdominal:     General: Bowel sounds are normal.     Palpations: Abdomen is soft.     Tenderness: There is no abdominal tenderness. There is no guarding or rebound.  Musculoskeletal:        General: Normal range of motion.     Cervical back: Normal range of motion and neck supple.     Comments: Right knee swelling without redness or warmth.   Skin:    General: Skin is warm and dry.     Findings: No rash.  Neurological:     Mental Status: She is alert and oriented to person, place, and time.     ED Results / Procedures / Treatments   Labs (all labs ordered are listed, but only abnormal results are displayed) Labs Reviewed  RESPIRATORY PANEL BY RT PCR (FLU A&B, COVID)  CBC WITH DIFFERENTIAL/PLATELET  COMPREHENSIVE METABOLIC PANEL  TROPONIN I (HIGH SENSITIVITY)   Results for orders placed or performed during the hospital encounter of 07/24/20  Respiratory Panel by RT PCR (Flu A&B, Covid) - Nasopharyngeal Swab   Specimen: Nasopharyngeal Swab  Result Value Ref Range   SARS Coronavirus 2 by RT PCR NEGATIVE NEGATIVE   Influenza A by PCR NEGATIVE NEGATIVE   Influenza B by PCR NEGATIVE NEGATIVE  CBC with Differential  Result Value Ref Range   WBC 4.9 4.0 - 10.5 K/uL   RBC 4.25 3.87 - 5.11 MIL/uL   Hemoglobin 13.4 12.0 - 15.0 g/dL   HCT 41.3 36 - 46 %  MCV 97.2 80.0 - 100.0 fL   MCH 31.5 26.0 - 34.0 pg   MCHC 32.4 30.0 - 36.0 g/dL   RDW 13.2 11.5 - 15.5 %   Platelets  165 150 - 400 K/uL   nRBC 0.0 0.0 - 0.2 %   Neutrophils Relative % 40 %   Neutro Abs 2.0 1.7 - 7.7 K/uL   Lymphocytes Relative 42 %   Lymphs Abs 2.1 0.7 - 4.0 K/uL   Monocytes Relative 10 %   Monocytes Absolute 0.5 0.1 - 1.0 K/uL   Eosinophils Relative 7 %   Eosinophils Absolute 0.3 0 - 0 K/uL   Basophils Relative 1 %   Basophils Absolute 0.0 0 - 0 K/uL   Immature Granulocytes 0 %   Abs Immature Granulocytes 0.00 0.00 - 0.07 K/uL  Comprehensive metabolic panel  Result Value Ref Range   Sodium 139 135 - 145 mmol/L   Potassium 3.9 3.5 - 5.1 mmol/L   Chloride 103 98 - 111 mmol/L   CO2 26 22 - 32 mmol/L   Glucose, Bld 93 70 - 99 mg/dL   BUN 20 8 - 23 mg/dL   Creatinine, Ser 0.99 0.44 - 1.00 mg/dL   Calcium 9.4 8.9 - 10.3 mg/dL   Total Protein 7.6 6.5 - 8.1 g/dL   Albumin 4.1 3.5 - 5.0 g/dL   AST 19 15 - 41 U/L   ALT 13 0 - 44 U/L   Alkaline Phosphatase 73 38 - 126 U/L   Total Bilirubin 0.7 0.3 - 1.2 mg/dL   GFR, Estimated 55 (L) >60 mL/min   Anion gap 10 5 - 15  Troponin I (High Sensitivity)  Result Value Ref Range   Troponin I (High Sensitivity) 5 <18 ng/L  Troponin I (High Sensitivity)  Result Value Ref Range   Troponin I (High Sensitivity) 7 <18 ng/L    EKG None  Radiology No results found.  Procedures Procedures (including critical care time)  Medications Ordered in ED Medications - No data to display  ED Course  I have reviewed the triage vital signs and the nursing notes.  Pertinent labs & imaging results that were available during my care of the patient were reviewed by me and considered in my medical decision making (see chart for details).    MDM Rules/Calculators/A&P                          Patient to ED with DOE x 2 days, started on Xarelto by PCP after d-dimer was found elevated. Here with persistent/worsening symptoms.   VSS, no hypoxia. She is bradycardic with history of same. No EKG changes. Viral panel negative. CTA chest negative for PE.    The patient is asymptomatic while at rest. She is ambulated while checking her pulse ox and remains above 95%. She states that after returning to bed she had SOB and chest tightness, stating "I feel like I ran a marathon".   Cardiology consulted who recommends obs admission for stress testing which can be done at [facility]. Hospitalist paged. Patient updated on plan.   Final Clinical Impression(s) / ED Diagnoses Final diagnoses:  None   1. DOE 2. Unstable angina  Rx / DC Orders ED Discharge Orders    None       Charlann Lange, PA-C 07/24/20 0653    Charlann Lange, PA-C 07/24/20 2440    Merrily Pew, MD 07/24/20 [phone]

## 2020-07-24 NOTE — ED Triage Notes (Signed)
Pt reports having shortness of breath and chest pain that started yesterday. Pt reports being seen by PCP and was started on Xarelto due to elevated d-dimer.

## 2020-07-24 NOTE — Progress Notes (Signed)
Bilateral lower extremity venous duplex completed. Refer to "CV Proc" under chart review to view preliminary results.  07/24/2020 12:00 PM Kelby Aline., MHA, RVT, RDCS, RDMS

## 2020-07-24 NOTE — H&P (Signed)
History and Physical    [name] QIO:[id number] DOB: 04/20/1941 DOA: 07/24/2020  PCP: Fanny Bien, MD  Patient coming from: Home  Chief Complaint: DOE  HPI: [name] is a 79 y.o. female with medical history significant of HTN, MVP, bradycardia. Presenting with 3 days of dyspnea on exertion and chest tightness. She report early Friday morning she felt like she couldn't catch her breath. Movement made it all worse, as if she "ran 100 miles". She noticed that resting improved her symptoms, but they were still presented. Her DOE would trigger cough and chest tightness. The chest tightness would last for 5 to 10 minutes. It was a substernal and right chest tightness that did not radiate. She quickly saw her PCP Friday. A d-dimer was checked and found to be elevated. She was placed on Xarelto. However as she moved into Saturday, her symptoms worsened. She states, "Walking to the bathroom made me feel like I was in a marathon." She talked with her PCP again, who recommended that she come to the ED for workup.    ED Course: CTA PE was negative for clot, PNA, edema. Vasculature suggestive of PAH. She had negative troponins. Was found negative for COVID. EDP discussed case with cardiology. TRH was called for admission.   Review of Systems:  Denies lightheaded/dizzy, syncopal episode, palpitations, N/V/F/chills. Review of systems is otherwise negative for all not mentioned in HPI.   PMHx Past Medical History:  Diagnosis Date  . Anal or rectal pain    sometimes  . Anemia    hx of during pregnancy  . Arthritis   . Asthma    hx of  . Bradycardia    " I KNOW I HAVE BRADYCARDIA ESPECIALLY WHEN I SLEEP"   . Chronic back pain   . Degenerative joint disease    osteo  . Depression   . Diverticulosis 2003  . Dysrhythmia    hx of  due to eye drop and also low heart rate 40's per pt. Dr. Gwenlyn Found follows  . Elevated total protein   . Esophageal dysmotility   . Fibromyalgia   . GERD  (gastroesophageal reflux disease)    subsequent Nissen Fundoplication  . Glaucoma   . Hearing loss   . Hemorrhoids   . Hiatal hernia 11/08/09  . Hx of adenomatous colonic polyps 07/02/02  . Hypercalcemia   . Hyperlipidemia   . Hyperlipidemia   . Hypertension   . Nausea   . Osteoporosis   . PONV (postoperative nausea and vomiting)   . Rectal bleeding    from hemorrhoids.    . Sleep apnea    DOES USE CPAP   . Thrombocytopenia ([place])   . Varicose veins of left lower extremity     PSHx Past Surgical History:  Procedure Laterality Date  . CARDIAC CATHETERIZATION  03/14/1992   Normal cardiac cath. Normal LV function.  [name] CARDIOVASCULAR STRESS TEST  01/22/2011   No scintigraphic evidence of inducible ischemia.  [name] CAROTID DOPPLER  03/31/2007   Bilateral ICAs - no evidence of significant diameter reduction, dissectin, tortuosity, FMD, or any other vascular abnormality.  . ESOPHAGEAL MANOMETRY N/A 11/14/2015   Procedure: ESOPHAGEAL MANOMETRY (EM);  Surgeon: Mauri Pole, MD;  Location: [facility];  Service: Endoscopy;  Laterality: N/A;  . GASTRIC RESECTION  2009  . JOINT REPLACEMENT     right knee Dr. Wynelle Link 06-23-18  . NISSEN FUNDOPLICATION  3244   with subsequent takedown in 2009  . TOTAL  KNEE ARTHROPLASTY Left 06/10/2017   Procedure: LEFT  TOTAL KNEE ARTHROPLASTY;  Surgeon: Gaynelle Arabian, MD;  Location: [facility];  Service: Orthopedics;  Laterality: Left;  Adductor Block  . TOTAL KNEE ARTHROPLASTY Right 06/23/2018   Procedure: RIGHT TOTAL KNEE ARTHROPLASTY;  Surgeon: Gaynelle Arabian, MD;  Location: [facility];  Service: Orthopedics;  Laterality: Right;  . TRANSTHORACIC ECHOCARDIOGRAM  12/21/2010   EF 60%, moderate LVH,     SocHx  reports that she has never smoked. She has never used smokeless tobacco. She reports that she does not drink alcohol and does not use drugs.  Allergies  Allergen Reactions  . Adalat [Nifedipine] Other (See Comments)    Unknown  . Hydrocodone Itching and  Nausea And Vomiting  . Meperidine Hcl Other (See Comments)    Demerol causes hallucinations  . Naproxen Nausea And Vomiting    FamHx Family History  Problem Relation Age of Onset  . Heart disease Mother   . Hypertension Mother   . Diabetes Mother   . Diabetes Father   . Kidney disease Father   . Breast cancer Daughter   . Stomach cancer Maternal Aunt   . Leukemia Maternal Uncle   . Prostate cancer Maternal Uncle   . Stroke Brother   . Cancer Maternal Grandmother   . Kidney disease Maternal Grandfather   . Uterine cancer Maternal Aunt   . Colon cancer Neg Hx     Prior to Admission medications   Medication Sig Start Date End Date Taking? Authorizing Provider  brimonidine (ALPHAGAN P) 0.1 % SOLN Place 1 drop into both eyes in the morning, at noon, and at bedtime.   Yes [provider]  cetirizine (ZYRTEC) 10 MG tablet Take 10 mg by mouth at bedtime.   Yes [provider]  famotidine (PEPCID) 40 MG tablet Take 40 mg by mouth daily. 03/06/20  Yes [provider]  FLUoxetine (PROZAC) 40 MG capsule Take 40 mg by mouth daily. 12/10/19  Yes [provider]  losartan (COZAAR) 50 MG tablet Take 50 mg by mouth daily.    Yes [provider]  methocarbamol (ROBAXIN) 500 MG tablet Take 1 tablet by mouth 3 (three) times daily as needed for muscle spasms.   Yes [provider]  montelukast (SINGULAIR) 10 MG tablet Take 10 mg by mouth at bedtime.   Yes [provider]  nystatin (MYCOSTATIN/NYSTOP) powder Apply 1 g topically daily as needed (for rash).  11/20/16  Yes [provider]  nystatin ointment (MYCOSTATIN) Apply 1 application topically 2 (two) times daily as needed (for rash).  11/20/16  Yes [provider]  omeprazole (PRILOSEC) 40 MG capsule Take 40 mg by mouth daily.    Yes [provider]  rosuvastatin (CRESTOR) 40 MG tablet Take 40 mg by mouth daily.    Yes [provider]  traMADol (ULTRAM)  50 MG tablet Take 50 mg by mouth 3 (three) times daily as needed. 07/06/20  Yes [provider]  XARELTO 15 MG TABS tablet Take 15 mg by mouth 2 (two) times daily. 07/22/20  Yes [provider]  Albuterol Sulfate (PROAIR RESPICLICK) 211 (90 Base) MCG/ACT AEPB Inhale 2 puffs into the lungs every 6 (six) hours as needed. Patient not taking: Reported on 07/24/2020 05/02/16   Mannam, Hart Robinsons, MD  bimatoprost (LUMIGAN) 0.01 % SOLN Place 1 drop into both eyes at bedtime.     [provider]  dorzolamide-timolol (COSOPT) 22.3-6.8 MG/ML ophthalmic solution Place 1 drop into both  eyes in the morning and at bedtime.    [provider]  ondansetron (ZOFRAN) 4 MG tablet Take 1 tablet (4 mg total) by mouth every 8 (eight) hours as needed for nausea or vomiting. 07/30/17   Mauri Pole, MD    Physical Exam: Vitals:   07/24/20 0308 07/24/20 0613 07/24/20 0630 07/24/20 0700  BP: (!) 172/65 (!) 164/61 (!) 148/58 (!) 150/65  Pulse: (!) 54 64 (!) 44 (!) 49  Resp: 12 17 17 18   Temp:      TempSrc:      SpO2: 97% 98% 97% 97%  Weight:      Height:        General: 79 y.o. female resting in bed in NAD Eyes: PERRL, normal sclera ENMT: Nares patent w/o discharge, orophaynx clear, dentition normal, ears w/o discharge/lesions/ulcers Neck: Supple, trachea midline Cardiovascular: bradycardia, +S1, S2, no m/g/r, equal pulses throughout Respiratory: CTABL, no w/r/r, normal WOB GI: BS+, NDNT, no masses noted, no organomegaly noted MSK: No e/c/c Skin: No rashes, bruises, ulcerations noted Neuro: A&O x 3, no focal deficits Psyc: Appropriate interaction and affect, calm/cooperative  Labs on Admission: I have personally reviewed following labs and imaging studies  CBC: Recent Labs  Lab 07/24/20 0127  WBC 4.9  NEUTROABS 2.0  HGB 13.4  HCT 41.3  MCV 97.2  PLT 001   Basic Metabolic Panel: Recent Labs  Lab 07/24/20 0127  NA 139  K 3.9  CL 103  CO2 26  GLUCOSE 93    BUN 20  CREATININE 0.99  CALCIUM 9.4   GFR: Estimated Creatinine Clearance: 58.1 mL/min (by C-G formula based on SCr of 0.99 mg/dL). Liver Function Tests: Recent Labs  Lab 07/24/20 0127  AST 19  ALT 13  ALKPHOS 73  BILITOT 0.7  PROT 7.6  ALBUMIN 4.1   No results for input(s): LIPASE, AMYLASE in the last 168 hours. No results for input(s): AMMONIA in the last 168 hours. Coagulation Profile: No results for input(s): INR, PROTIME in the last 168 hours. Cardiac Enzymes: No results for input(s): CKTOTAL, CKMB, CKMBINDEX, TROPONINI in the last 168 hours. BNP (last 3 results) No results for input(s): PROBNP in the last 8760 hours. HbA1C: No results for input(s): HGBA1C in the last 72 hours. CBG: No results for input(s): GLUCAP in the last 168 hours. Lipid Profile: No results for input(s): CHOL, HDL, LDLCALC, TRIG, CHOLHDL, LDLDIRECT in the last 72 hours. Thyroid Function Tests: No results for input(s): TSH, T4TOTAL, FREET4, T3FREE, THYROIDAB in the last 72 hours. Anemia Panel: No results for input(s): VITAMINB12, FOLATE, FERRITIN, TIBC, IRON, RETICCTPCT in the last 72 hours. Urine analysis:    Component Value Date/Time   COLORURINE STRAW (A) 06/27/2018 0811   APPEARANCEUR CLEAR 06/27/2018 0811   LABSPEC 1.008 06/27/2018 0811   PHURINE 9.0 (H) 06/27/2018 0811   GLUCOSEU NEGATIVE 06/27/2018 0811   HGBUR SMALL (A) 06/27/2018 0811   BILIRUBINUR NEGATIVE 06/27/2018 0811   KETONESUR NEGATIVE 06/27/2018 0811   PROTEINUR NEGATIVE 06/27/2018 0811   UROBILINOGEN 0.2 06/09/2015 1931   NITRITE NEGATIVE 06/27/2018 0811   LEUKOCYTESUR NEGATIVE 06/27/2018 0811    Radiological Exams on Admission: CT Angio Chest PE W and/or Wo Contrast  Result Date: 07/24/2020 CLINICAL DATA:  Shortness of breath and chest pain starting yesterday. Elevated D-dimer. EXAM: CT ANGIOGRAPHY CHEST WITH CONTRAST TECHNIQUE: Multidetector CT imaging of the chest was performed using the standard protocol  during bolus administration of intravenous contrast. Multiplanar CT image reconstructions and MIPs were obtained  to evaluate the vascular anatomy. CONTRAST:  164mL OMNIPAQUE IOHEXOL 350 MG/ML SOLN COMPARISON:  Chest radiograph 06/26/2018.  CTA chest 08/07/2009 FINDINGS: Cardiovascular: The quality of this exam for evaluation of pulmonary embolism is moderate to good. The bolus is well timed. There is motion degradation in the mid and lower chest. Given this degradation, no pulmonary embolism identified. Aortic atherosclerosis. Tortuous thoracic aorta. Moderate cardiomegaly, without pericardial effusion. Pulmonary artery enlargement, outflow tract 3.5 cm. Mediastinum/Nodes: No mediastinal or hilar adenopathy. Small hiatal hernia with surgical changes at the proximal stomach. Lungs/Pleura: No pleural fluid.  Clear lungs. Upper Abdomen: Motion degradation continuing into the upper abdomen. Normal imaged portions of the liver, spleen, pancreas, gallbladder, adrenal glands, left kidney. Right renal cortical thinning. Extensive colonic diverticulosis. Musculoskeletal: No acute osseous abnormality. Review of the MIP images confirms the above findings. IMPRESSION: 1. Mild limitations secondary to motion. No pulmonary embolism given these limitations. 2.  No acute process in the chest. 3.  Aortic Atherosclerosis (ICD10-I70.0). 4. Small hiatal hernia. 5. Pulmonary artery enlargement suggests pulmonary arterial hypertension. Electronically Signed   By: Abigail Miyamoto M.D.   On: 07/24/2020 04:56   Assessment/Plan DOE Chest pain     - admit to obs, telemetry     - cardiology consulted by EDP; will await final cards recs     - check EKG, Echo     - trp negative x 2     - check BNP     - currently on xarelto; see below  Bradycardia     - chronic, asymptomatic, follow  HLD     - continue crestor  HTN     - continue cozaar  Anxiety/depression     - continue prozac  GERD     - continue PPI  Pt on  anticoagulation     - started on anticoag by PCP d/t elevated d-dimer     - CTA PE is negative for clot     - check BLE venous dopplers; if negative, I don't know that there would be need to continue full anticoag in her  DVT prophylaxis: xarelto  Code Status: FULL  Family Communication: With husband at bedside and dtr on phone.  Consults called: EDP called cardiology  Admission status: Observation.   Status is: Observation  The patient remains OBS appropriate and will d/c before 2 midnights.  Dispo: The patient is from: Home              Anticipated d/c is to: Home              Anticipated d/c date is: 1 day              Patient currently is not medically stable to d/c.  Jonnie Finner DO [facility]  If 7PM-7AM, please contact night-coverage [email or web address]  07/24/2020, 8:09 AM

## 2020-07-24 NOTE — Consult Note (Signed)
Cardiology Consultation:   Patient ID: [name]; [id number]; [birth year]   Admit date: 07/24/2020 Date of Consult: 07/24/2020  Primary Care Provider: Fanny Bien, MD Primary Cardiologist: Dr. Quay Burow, MD   Patient Profile:   [name] is a 79 y.o. female with a hx of HLD, HTN, DJD, depression,  Fibromyalgia, GERD and sleep apnea who is being seen today for the evaluation of DOE and chest tightness at the request of Dr. Marylyn Ishihara.  History of Present Illness:   [name] is a 79 yo F with a hx as stated above who presented to [facility] with c/o of exertional SOB for the last two days with associated anterior  chest tightness. She states that her symptoms began on Friday and prior to this, she was in her usual state of health. She has had intermittent chest tightness which is usually accompanied by the DOE which relives with rest and catching her breath. She states that walking across the room at her house would trigger her symptoms.  She also sometimes has the discomfort at rest.  She denies any lower extremity edema, orthopnea, or PND.  She uses her CPAP regularly.  She does have a history of a Nissen fundoplication and GERD that has not been well-controlled.  She does not get much exercise and walks with a cane.  She was seen by her PCP at onset of symptoms with patient reporting an elevated D-dimer on lab work. She was subsequently started on Xarelto with plans for imaging early next week.   Given no relief in her symptoms, she presented to [facility] for further evaluation. On arrival, hsT found to be negative at 5>>7. CTA confirmed no PE however did show aortic atherosclerosis and pulmonary artery enlargement suggestive of PAH. No EKG performed.   She is followed by Dr. Gwenlyn Found for HTN and HLD. She previously underwent and echocardiogram, event monitor in the setting of pre-syncope and chest pain which were found to be normal with the exception of bradycardia in the 40's on the monitor.  She was noted to be on Timolol eye drops which were felt to be contributing. In 05/2018 she was seen in follow up for preoperative clearance prior to TKR per Dr. Maureen Ralphs. She was felt to be low risk with no further preoperative cardiac workup necessary.   Most recently she was seen 08/2019 and was noted to be doing well from a CV standpoint and no changes were made at that time.   Past Medical History:  Diagnosis Date  . Anal or rectal pain    sometimes  . Anemia    hx of during pregnancy  . Arthritis   . Asthma    hx of  . Bradycardia    " I KNOW I HAVE BRADYCARDIA ESPECIALLY WHEN I SLEEP"   . Chronic back pain   . Degenerative joint disease    osteo  . Depression   . Diverticulosis 2003  . Dysrhythmia    hx of  due to eye drop and also low heart rate 40's per pt. Dr. Gwenlyn Found follows  . Elevated total protein   . Esophageal dysmotility   . Fibromyalgia   . GERD (gastroesophageal reflux disease)    subsequent Nissen Fundoplication  . Glaucoma   . Hearing loss   . Hemorrhoids   . Hiatal hernia 11/08/09  . Hx of adenomatous colonic polyps 07/02/02  . Hypercalcemia   . Hyperlipidemia   . Hyperlipidemia   . Hypertension   .  Nausea   . Osteoporosis   . PONV (postoperative nausea and vomiting)   . Rectal bleeding    from hemorrhoids.    . Sleep apnea    DOES USE CPAP   . Thrombocytopenia ([place])   . Varicose veins of left lower extremity     Past Surgical History:  Procedure Laterality Date  . CARDIAC CATHETERIZATION  03/14/1992   Normal cardiac cath. Normal LV function.  [name] CARDIOVASCULAR STRESS TEST  01/22/2011   No scintigraphic evidence of inducible ischemia.  [name] CAROTID DOPPLER  03/31/2007   Bilateral ICAs - no evidence of significant diameter reduction, dissectin, tortuosity, FMD, or any other vascular abnormality.  . ESOPHAGEAL MANOMETRY N/A 11/14/2015   Procedure: ESOPHAGEAL MANOMETRY (EM);  Surgeon: Mauri Pole, MD;  Location: [facility];  Service: Endoscopy;   Laterality: N/A;  . GASTRIC RESECTION  2009  . JOINT REPLACEMENT     right knee Dr. Wynelle Link 06-23-18  . NISSEN FUNDOPLICATION  5170   with subsequent takedown in 2009  . TOTAL KNEE ARTHROPLASTY Left 06/10/2017   Procedure: LEFT  TOTAL KNEE ARTHROPLASTY;  Surgeon: Gaynelle Arabian, MD;  Location: [facility];  Service: Orthopedics;  Laterality: Left;  Adductor Block  . TOTAL KNEE ARTHROPLASTY Right 06/23/2018   Procedure: RIGHT TOTAL KNEE ARTHROPLASTY;  Surgeon: Gaynelle Arabian, MD;  Location: [facility];  Service: Orthopedics;  Laterality: Right;  . TRANSTHORACIC ECHOCARDIOGRAM  12/21/2010   EF 60%, moderate LVH,      Prior to Admission medications   Medication Sig Start Date End Date Taking? Authorizing Provider  brimonidine (ALPHAGAN P) 0.1 % SOLN Place 1 drop into both eyes in the morning, at noon, and at bedtime.   Yes [provider]  cetirizine (ZYRTEC) 10 MG tablet Take 10 mg by mouth at bedtime.   Yes [provider]  famotidine (PEPCID) 40 MG tablet Take 40 mg by mouth daily. 03/06/20  Yes [provider]  FLUoxetine (PROZAC) 40 MG capsule Take 40 mg by mouth daily. 12/10/19  Yes [provider]  losartan (COZAAR) 50 MG tablet Take 50 mg by mouth daily.    Yes [provider]  methocarbamol (ROBAXIN) 500 MG tablet Take 1 tablet by mouth 3 (three) times daily as needed for muscle spasms.   Yes [provider]  montelukast (SINGULAIR) 10 MG tablet Take 10 mg by mouth at bedtime.   Yes [provider]  nystatin (MYCOSTATIN/NYSTOP) powder Apply 1 g topically daily as needed (for rash).  11/20/16  Yes [provider]  nystatin ointment (MYCOSTATIN) Apply 1 application topically 2 (two) times daily as needed (for rash).  11/20/16  Yes [provider]  omeprazole (PRILOSEC) 40 MG capsule Take 40 mg by mouth daily.    Yes [provider]  rosuvastatin (CRESTOR) 40 MG tablet Take 40 mg by mouth daily.    Yes [provider]  traMADol (ULTRAM) 50 MG tablet Take 50 mg by mouth 3 (three) times daily as needed. 07/06/20  Yes [provider]  XARELTO 15 MG TABS tablet Take 15 mg by mouth 2 (two) times daily. 07/22/20  Yes [provider]  Albuterol Sulfate (PROAIR RESPICLICK) 017 (90 Base) MCG/ACT AEPB Inhale 2 puffs into the lungs every 6 (six) hours as needed. Patient not taking: Reported on 07/24/2020 05/02/16   Mannam, Hart Robinsons, MD  bimatoprost (LUMIGAN) 0.01 % SOLN Place 1 drop into both eyes at bedtime.     [provider]  dorzolamide-timolol (COSOPT) 22.3-6.8  MG/ML ophthalmic solution Place 1 drop into both eyes in the morning and at bedtime.    [provider]  ondansetron (ZOFRAN) 4 MG tablet Take 1 tablet (4 mg total) by mouth every 8 (eight) hours as needed for nausea or vomiting. 07/30/17   Mauri Pole, MD    Inpatient Medications: Scheduled Meds: . triamcinolone acetonide  10 mg Other Once   Continuous Infusions:  PRN Meds:   Allergies:    Allergies  Allergen Reactions  . Adalat [Nifedipine] Other (See Comments)    Unknown  . Hydrocodone Itching and Nausea And Vomiting  . Meperidine Hcl Other (See Comments)    Demerol causes hallucinations  . Naproxen Nausea And Vomiting    Social History:   Social History   Socioeconomic History  . Marital status: Married    Spouse name: Not on file  . Number of children: 8  . Years of education: Not on file  . Highest education level: Not on file  Occupational History  . Occupation: retired    Comment: retired Therapist, art.   Tobacco Use  . Smoking status: Never Smoker  . Smokeless tobacco: Never Used  Vaping Use  . Vaping Use: Never used  Substance and Sexual Activity  . Alcohol use: No    Alcohol/week: 0.0 standard drinks  . Drug use: No  . Sexual activity: Yes  Other Topics Concern  . Not on file  Social History Narrative   Husband, Adriane Gabbert is Next of Kin. Cell # [phone]    Social Determinants of Health   Financial Resource Strain:   . Difficulty of Paying Living Expenses: Not on file  Food Insecurity:   . Worried About Charity fundraiser in the Last Year: Not on file  . Ran Out of Food in the Last Year: Not on file  Transportation Needs:   . Lack of Transportation (Medical): Not on file  . Lack of Transportation (Non-Medical): Not on file  Physical Activity:   . Days of Exercise per Week: Not on file  . Minutes of Exercise per Session: Not on file  Stress:   . Feeling of Stress : Not on file  Social Connections:   . Frequency of Communication with Friends and Family: Not on file  . Frequency of Social Gatherings with Friends and Family: Not on file  . Attends Religious Services: Not on file  . Active Member of Clubs or Organizations: Not on file  . Attends Archivist Meetings: Not on file  . Marital Status: Not on file  Intimate Partner Violence:   . Fear of Current or Ex-Partner: Not on file  . Emotionally Abused: Not on file  . Physically Abused: Not on file  . Sexually Abused: Not on file    Family History:   Family History  Problem Relation Age of Onset  . Heart disease Mother   . Hypertension Mother   . Diabetes Mother   . Diabetes Father   . Kidney disease Father   . Breast cancer Daughter   . Stomach cancer Maternal Aunt   . Leukemia Maternal Uncle   . Prostate cancer Maternal Uncle   . Stroke Brother   . Cancer Maternal Grandmother   . Kidney disease Maternal Grandfather   . Uterine cancer Maternal Aunt   . Colon cancer Neg Hx    Family Status:  Family Status  Relation Name Status  . Mother  Alive  . Father  Deceased  . Daughter  Alive  . Mat [employer]  . Mat [employer]  . Mat [employer]  . Brother  Alive  . MGM  Deceased  . MGF  Deceased  . PGM  Deceased  . PGF  Deceased  . Brother  Alive  . Brother  Deceased  . Mat Aunt  (Not Specified)  . Neg Hx  (Not Specified)    ROS:  Please see the  history of present illness.  All other ROS reviewed and negative.     Physical Exam/Data:   Vitals:   07/24/20 0308 07/24/20 0613 07/24/20 0630 07/24/20 0700  BP: (!) 172/65 (!) 164/61 (!) 148/58 (!) 150/65  Pulse: (!) 54 64 (!) 44 (!) 49  Resp: _0 Temp:      TempSrc:      SpO2: 97% 98% 97% 97%  Weight:      Height:       No intake or output data in the 24 hours ending 07/24/20 0745 Filed Weights   07/24/20 0030  Weight: 90.7 kg   Body mass index is 28.7 kg/m.  VS:  BP (!) 154/61   Pulse (!) 52   Temp 98.1 F (36.7 C) (Oral)   Resp 16   Ht _1  (1.778 m)   Wt 90.7 kg   SpO2 92%   BMI 28.70 kg/m  , BMI Body mass index is 28.7 kg/m. GENERAL:  Ill-appearing HEENT: Pupils equal round and reactive, fundi not visualized, oral mucosa unremarkable NECK:  No jugular venous distention, waveform within normal limits, carotid upstroke brisk and symmetric, no bruits LUNGS:  Clear to auscultation bilaterally HEART:  RRR.  PMI not displaced or sustained,S1 and S2 within normal limits, no S3, no S4, no clicks, no rubs, no murmurs ABD:  Flat, positive bowel sounds normal in frequency in pitch, no bruits, no rebound, no guarding, no midline pulsatile mass, no hepatomegaly, no splenomegaly EXT:  2 plus pulses throughout, no edema, no cyanosis no clubbing SKIN:  No rashes no nodules NEURO:  Cranial nerves II through XII grossly intact, motor grossly intact throughout PSYCH:  Cognitively intact, oriented to person place and time   EKG:  The EKG was personally reviewed and demonstrates: To be performed  Telemetry:  Telemetry was personally reviewed and demonstrates: Sinus bradycardia.  Rate 40s to 50s.  Relevant CV Studies:  Lexiscan Myoview Stress Test 06/01/2015:   The left ventricular ejection fraction is normal (55-65%).  Nuclear stress EF: 56%.  There was no ST segment deviation noted during stress.  The study is normal.  This is a low risk study.  Normal  myocardial perfusion without scar or ischemia.   Low risk lexiscan myocardial nuclear study demonstrating normal myocardial perfusion and function; EF 56%.  LHC 03/14/1992:  See scanned documents   Laboratory Data:  Chemistry Recent Labs  Lab 07/24/20 0127  NA 139  K 3.9  CL 103  CO2 26  GLUCOSE 93  BUN 20  CREATININE 0.99  CALCIUM 9.4  GFRNONAA 55*  ANIONGAP 10    Total Protein  Date Value Ref Range Status  07/24/2020 7.6 6.5 - 8.1 g/dL Final  01/08/2017 7.3 6.4 - 8.3 g/dL Final   Albumin  Date Value Ref Range Status  07/24/2020 4.1 3.5 - 5.0 g/dL Final  01/08/2017 3.8 3.5 - 5.0 g/dL Final   AST  Date Value Ref Range Status  07/24/2020 19 15 - 41 U/L Final  01/08/2017 22 5 -  34 U/L Final   ALT  Date Value Ref Range Status  07/24/2020 13 0 - 44 U/L Final  01/08/2017 18 0 - 55 U/L Final   Alkaline Phosphatase  Date Value Ref Range Status  07/24/2020 73 38 - 126 U/L Final  01/08/2017 71 40 - 150 U/L Final   Total Bilirubin  Date Value Ref Range Status  07/24/2020 0.7 0.3 - 1.2 mg/dL Final  01/08/2017 0.46 0.20 - 1.20 mg/dL Final   Hematology Recent Labs  Lab 07/24/20 0127  WBC 4.9  RBC 4.25  HGB 13.4  HCT 41.3  MCV 97.2  MCH 31.5  MCHC 32.4  RDW 13.2  PLT 165   Cardiac EnzymesNo results for input(s): TROPONINI in the last 168 hours. No results for input(s): TROPIPOC in the last 168 hours.  BNPNo results for input(s): BNP, PROBNP in the last 168 hours.  DDimer No results for input(s): DDIMER in the last 168 hours. TSH:  Lab Results  Component Value Date   TSH 0.652 12/21/2010   Lipids:No results found for: CHOL, HDL, LDLCALC, LDLDIRECT, TRIG, CHOLHDL HgbA1c: Lab Results  Component Value Date   HGBA1C (H) 12/20/2010    6.3 (NOTE)                                                                       According to the ADA Clinical Practice Recommendations for 2011, when HbA1c is used as a screening test:   >=6.5%   Diagnostic of Diabetes  Mellitus           (if abnormal result  is confirmed)  5.7-6.4%   Increased risk of developing Diabetes Mellitus  References:Diagnosis and Classification of Diabetes Mellitus,Diabetes QION,6295,28(UXLKG 1):S62-S69 and Standards of Medical Care in         Diabetes - 2011,Diabetes MWNU,2725,36  (Suppl 1):S11-S61.    Radiology/Studies:  CT Angio Chest PE W and/or Wo Contrast  Result Date: 07/24/2020 CLINICAL DATA:  Shortness of breath and chest pain starting yesterday. Elevated D-dimer. EXAM: CT ANGIOGRAPHY CHEST WITH CONTRAST TECHNIQUE: Multidetector CT imaging of the chest was performed using the standard protocol during bolus administration of intravenous contrast. Multiplanar CT image reconstructions and MIPs were obtained to evaluate the vascular anatomy. CONTRAST:  152m OMNIPAQUE IOHEXOL 350 MG/ML SOLN COMPARISON:  Chest radiograph 06/26/2018.  CTA chest 08/07/2009 FINDINGS: Cardiovascular: The quality of this exam for evaluation of pulmonary embolism is moderate to good. The bolus is well timed. There is motion degradation in the mid and lower chest. Given this degradation, no pulmonary embolism identified. Aortic atherosclerosis. Tortuous thoracic aorta. Moderate cardiomegaly, without pericardial effusion. Pulmonary artery enlargement, outflow tract 3.5 cm. Mediastinum/Nodes: No mediastinal or hilar adenopathy. Small hiatal hernia with surgical changes at the proximal stomach. Lungs/Pleura: No pleural fluid.  Clear lungs. Upper Abdomen: Motion degradation continuing into the upper abdomen. Normal imaged portions of the liver, spleen, pancreas, gallbladder, adrenal glands, left kidney. Right renal cortical thinning. Extensive colonic diverticulosis. Musculoskeletal: No acute osseous abnormality. Review of the MIP images confirms the above findings. IMPRESSION: 1. Mild limitations secondary to motion. No pulmonary embolism given these limitations. 2.  No acute process in the chest. 3.  Aortic  Atherosclerosis (ICD10-I70.0). 4. Small hiatal hernia. 5. Pulmonary artery enlargement suggests  pulmonary arterial hypertension. Electronically Signed   By: Abigail Miyamoto M.D.   On: 07/24/2020 04:56   Assessment and Plan:   1. Dyspnea with associated chest pain: -Pt presented to [facility] with 2 day hx of DOE with short ambulation. She was seen by her PCP at onset of symptoms with patient reporting an elevated D-dimer on lab work. She was subsequently started on Xarelto with plans for imaging early next week. -On ED arrival, hsT found to be negative at 5>>7.  -CTA confirmed no PE however did show aortic atherosclerosis and pulmonary artery enlargement suggestive of PAH.  -No EKG performed>>ordered  -Unclear etology of her symptoms -Would suggest obtaining an echocardiogram to evaluate LV function as well as for potential valvular disease>>if abnormal>>continue to pursue further ischemic evaluation  -She has multiple CV risk factors including HTN, HLD, and aortic atherosclerosis would also consider stress testings versus coronary CTA for further ischemic evaluation.  -Obtain BNP  -Continue statin   2. Bradycardia: -Has known bradycardia per last cardiology notes.  -Pt asymptomatic>>chronic   -No beta blocker   3. HTN: -Elevated, 154/61>150/65>148/58 -Continue losartan   4. HLD: -Obtain lipid panel  -Continue rosuvastatin 67m PO QD    For questions or updates, please contact [facility] Please consult [email or web address] for contact info under Cardiology/STEMI.   Signed,Kathyrn DrownNP-C [facility] Pager: [phone]/07/2020 7:45 AM  History and all data above reviewed.  Patient examined.  I agree with the findings as above.  All available labs, radiology testing, previous records reviewed. Agree with documented assessment and plan. Ms. GWasselis a 31F with hypertension, hyperlipidemia, fibromyalgia, GERD and OSA on CPAP admitted with chest pain and shortness of breath.  Initially  there was concern for PE given her elevated D-dimer.  However CT of the chest was negative.  It did show aortic atherosclerosis but no coronary calcifications.  She had a normal stress test in 2018 and no CAD on cath in 1993.  EKG has not yet been performed but was just ordered.  On telemetry there are no ST changes.  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  Cardiac enzymes are negative.  Agree with getting an echocardiogram.  Her chest pain and symptoms are somewhat atypical.  She is having discomfort even while sitting now.  She denies any recent URI symptoms.  We will check an ESR out of concern for myocarditis.  We will also check a TSH.  She does have a history of GERD that is poorly controlled despite being on multiple medications.  She has a Nissen fundoplication and reports that this was complicated by adherence of her stomach to her liver.  Uncontrolled GERD could also be contributing.  As long as her echo shows normal wall motion, no plans for further ischemia evaluation at this time.   [name] C. ROval Linsey MD, [facility] 07/24/2020 9:12 AM

## 2020-07-24 NOTE — ED Notes (Signed)
Pt ambulated on pulse oximetry on room air. Her oxygen saturation remained around 95-99% while ambulating. Upon sitting back down she reported that she felt like she ran a marathon.

## 2020-07-24 NOTE — Progress Notes (Signed)
°  Echocardiogram 2D Echocardiogram has been performed.  [name] 07/24/2020, 11:13 AM

## 2020-07-25 DIAGNOSIS — R0609 Other forms of dyspnea: Secondary | ICD-10-CM | POA: Diagnosis not present

## 2020-07-25 DIAGNOSIS — R0600 Dyspnea, unspecified: Secondary | ICD-10-CM | POA: Diagnosis not present

## 2020-07-25 NOTE — Discharge Instructions (Signed)
Angina  Angina is very bad discomfort or pain in the chest, neck, arm, jaw, or back. The discomfort is caused by a lack of blood in the middle layer of the heart wall (myocardium). What are the causes? This condition is caused by a buildup of fat and cholesterol (plaque) in your arteries (atherosclerosis). This buildup narrows the arteries and makes it hard for blood to flow. What increases the risk? You are more likely to develop this condition if:  You have high levels of cholesterol in your blood.  You have high blood pressure (hypertension).  You have diabetes.  You have a family history of heart disease.  You are not active, or you do not exercise enough.  You feel sad (depressed).  You have been treated with high energy rays (radiation) on the left side of your chest. Other risk factors are:  Using tobacco.  Being very overweight (obese).  Eating a diet high in unhealthy fats (saturated fats).  Having stress, or being exposed to things that cause stress.  Using drugs, such as cocaine. Women have a greater risk for angina if:  They are older than 55.  They have stopped having their period (are in postmenopause). What are the signs or symptoms? Common symptoms of this condition in both men and women may include:  Chest pain, which may: ? Feel like a crushing or squeezing in the chest. ? Feel like a tightness, pressure, fullness, or heaviness in the chest. ? Last for more than a few minutes at a time. ? Stop and come back (recur) after a few minutes.  Pain in the neck, arm, jaw, or back.  Heartburn or upset stomach (indigestion) for no reason.  Being short of breath.  Feeling sick to your stomach (nauseous).  Sudden cold sweats. Women and people with diabetes may have other symptoms that are not usual, such as feeling:  Tired (fatigue).  Worried or nervous (anxious) for no reason.  Weak for no reason.  Dizzy or passing out (fainting). How is this  treated? This condition may be treated with:  Medicines. These are given to: ? Prevent blood clots. ? Prevent heart attack. ? Relax blood vessels and improve blood flow to the heart (nitrates). ? Reduce blood pressure. ? Improve the pumping action of the heart. ? Reduce fat and cholesterol in the blood.  A procedure to widen a narrowed or blocked artery in the heart (angioplasty).  Surgery to allow blood to go around a blocked artery (coronary artery bypass surgery). Follow these instructions at home: Medicines  Take over-the-counter and prescription medicines only as told by your doctor.  Do not take these medicines unless your doctor says that you can: ? NSAIDs. These include:  Ibuprofen.  Naproxen. ? Vitamin supplements that have vitamin A, vitamin E, or both. ? Hormone therapy that contains estrogen with or without progestin. Eating and drinking   Eat a heart-healthy diet that includes: ? Lots of fresh fruits and vegetables. ? Whole grains. ? Low-fat (lean) protein. ? Low-fat dairy products.  Follow instructions from your doctor about what you cannot eat or drink. Activity  Follow an exercise program that your doctor tells you.  Talk with your doctor about joining a program to help improve the health of your heart (cardiac rehab).  When you feel tired, take a break. Plan breaks if you know you are going to feel tired. Lifestyle   Do not use any products that contain nicotine or tobacco. This includes cigarettes, e-cigarettes, and   chewing tobacco. If you need help quitting, ask your doctor.  If your doctor says you can drink alcohol: ? Limit how much you use to:  0-1 drink a day for women who are not pregnant.  0-2 drinks a day for men. ? Be aware of how much alcohol is in your drink. In the [place] one drink equals:  One 12 oz bottle of beer (355 mL).  One 5 oz glass of wine (148 mL).  One 1 oz glass of hard liquor (44 mL). General instructions  Stay  at a healthy weight. If your doctor tells you to do so, work with him or her to lose weight.  Learn to deal with stress. If you need help, ask your doctor.  Keep your vaccines up to date. Get a flu shot every year.  Talk with your doctor if you feel sad. Take a screening test to see if you are at risk for depression.  Work with your doctor to manage any other health problems that you have. These may include diabetes or high blood pressure.  Keep all follow-up visits as told by your doctor. This is important. Get help right away if:  You have pain in your chest, neck, arm, jaw, or back, and the pain: ? Lasts more than a few minutes. ? Comes back. ? Does not get better after you take medicine under your tongue (sublingual nitroglycerin). ? Keeps getting worse. ? Comes more often.  You have any of these problems for no reason: ? Sweating a lot. ? Heartburn or upset stomach. ? Shortness of breath. ? Trouble breathing. ? Feeling sick to your stomach. ? Throwing up (vomiting). ? Feeling more tired than normal. ? Feeling nervous or worrying more than normal. ? Weakness.  You are suddenly dizzy or light-headed.  You pass out. These symptoms may be an emergency. Do not wait to see if the symptoms will go away. Get medical help right away. Call your local emergency services (911 in the [place] Do not drive yourself to the hospital. Summary  Angina is very bad discomfort or pain in the chest, neck, arm, neck, or back.  Symptoms include chest pain, heartburn or upset stomach for no reason, and shortness of breath.  Women or people with diabetes may have symptoms that are not usual, such as feeling nervous or worried for no reason, weak for no reason, or tired.  Take all medicines only as told by your doctor.  You should eat a heart-healthy diet and follow an exercise program. This information is not intended to replace advice given to you by your health care provider. Make sure you  discuss any questions you have with your health care provider. Document Revised: 05/19/2018 Document Reviewed: 05/19/2018 Elsevier Patient Education  [place] of Breath, Adult Shortness of breath means you have trouble breathing. Shortness of breath could be a sign of a medical problem. Follow these instructions at home:   Watch for any changes in your symptoms.  Do not use any products that contain nicotine or tobacco, such as cigarettes, e-cigarettes, and chewing tobacco.  Do not smoke. Smoking can cause shortness of breath. If you need help to quit smoking, ask your doctor.  Avoid things that can make it harder to breathe, such as: ? Mold. ? Dust. ? Air pollution. ? Chemical smells. ? Things that can cause allergy symptoms (allergens), if you have allergies.  Keep your living space clean. Use products that help remove mold and  dust.  Rest as needed. Slowly return to your normal activities.  Take over-the-counter and prescription medicines only as told by your doctor. This includes oxygen therapy and inhaled medicines.  Keep all follow-up visits as told by your doctor. This is important. Contact a doctor if:  Your condition does not get better as soon as expected.  You have a hard time doing your normal activities, even after you rest.  You have new symptoms. Get help right away if:  Your shortness of breath gets worse.  You have trouble breathing when you are resting.  You feel light-headed or you pass out (faint).  You have a cough that is not helped by medicines.  You cough up blood.  You have pain with breathing.  You have pain in your chest, arms, shoulders, or belly (abdomen).  You have a fever.  You cannot walk up stairs.  You cannot exercise the way you normally do. These symptoms may represent a serious problem that is an emergency. Do not wait to see if the symptoms will go away. Get medical help right away. Call your local  emergency services (911 in the [place] Do not drive yourself to the hospital. Summary  Shortness of breath is when you have trouble breathing enough air. It can be a sign of a medical problem.  Avoid things that make it hard for you to breathe, such as smoking, pollution, mold, and dust.  Watch for any changes in your symptoms. Contact your doctor if you do not get better or you get worse. This information is not intended to replace advice given to you by your health care provider. Make sure you discuss any questions you have with your health care provider. Document Revised: 03/03/2018 Document Reviewed: 03/03/2018 Elsevier Patient Education  [place].

## 2020-07-25 NOTE — Progress Notes (Signed)
Pt discharge home today per Dr. Maylene Roes, pt IV site Dc'd and WDL, VSS, pt provided discharge instructions and follow up appointment, verbalize understanding.

## 2020-07-25 NOTE — Plan of Care (Signed)

## 2020-07-25 NOTE — Discharge Summary (Signed)
Physician Discharge Summary  [name] RSW:[id number] DOB: 13-Dec-1940 DOA: 07/24/2020  PCP: Fanny Bien, MD  Admit date: 07/24/2020 Discharge date: 07/25/2020  Admitted From: Home Disposition:  Home  Recommendations for Outpatient Follow-up:  1. Follow up with PCP in 1 week 2. Follow up with Dr Gwenlyn Found Cardiology as scheduled on 11/5 3. Follow up with Dr Wynelle Link Orthopedic surgery   Discharge Condition: Stable CODE STATUS: Full code Diet recommendation:  Diet Orders (From admission, onward)    Start     Ordered   07/24/20 2030  Diet Heart Room service appropriate? Yes; Fluid consistency: Thin  Diet effective now       Question Answer Comment  Room service appropriate? Yes   Fluid consistency: Thin      07/24/20 2029         Brief/Interim Summary: From H&P by Dr. Marylyn Ishihara: "HPI: [name] is a 79 y.o. female with medical history significant of HTN, MVP, bradycardia. Presenting with 3 days of dyspnea on exertion and chest tightness. She report early Friday morning she felt like she couldn't catch her breath. Movement made it all worse, as if she "ran 100 miles". She noticed that resting improved her symptoms, but they were still presented. Her DOE would trigger cough and chest tightness. The chest tightness would last for 5 to 10 minutes. It was a substernal and right chest tightness that did not radiate. She quickly saw her PCP Friday. A d-dimer was checked and found to be elevated. She was placed on Xarelto. However as she moved into Saturday, her symptoms worsened. She states, "Walking to the bathroom made me feel like I was in a marathon." She talked with her PCP again, who recommended that she come to the ED for workup.    ED Course: CTA PE was negative for clot, PNA, edema. Vasculature suggestive of PAH. She had negative troponins. Was found negative for COVID. EDP discussed case with cardiology. TRH was called for admission."  Work-up largely remained unremarkable.   Echocardiogram, DVT ultrasound, ESR, TSH, troponin and BNP were negative.  On day of discharge, patient symptom had improved without much intervention.  Home, Xarelto was stopped due to negative CTA chest and negative DVT ultrasound.  No further cardiac work-up was planned.   Discharge Diagnoses:  Principal Problem:   Dyspnea on exertion Active Problems:   HLD (hyperlipidemia)   Depression   GERD   Essential hypertension   Atypical chest pain    Discharge Instructions  Discharge Instructions    Call MD for:  difficulty breathing, headache or visual disturbances   Complete by: As directed    Call MD for:  extreme fatigue   Complete by: As directed    Call MD for:  persistant dizziness or light-headedness   Complete by: As directed    Call MD for:  persistant nausea and vomiting   Complete by: As directed    Call MD for:  severe uncontrolled pain   Complete by: As directed    Call MD for:  temperature >100.4   Complete by: As directed    Discharge instructions   Complete by: As directed    You were cared for by a hospitalist during your hospital stay. If you have any questions about your discharge medications or the care you received while you were in the hospital after you are discharged, you can call the unit and ask to speak with the hospitalist on call if the hospitalist that took care of  you is not available. Once you are discharged, your primary care physician will handle any further medical issues. Please note that NO REFILLS for any discharge medications will be authorized once you are discharged, as it is imperative that you return to your primary care physician (or establish a relationship with a primary care physician if you do not have one) for your aftercare needs so that they can reassess your need for medications and monitor your lab values.   Increase activity slowly   Complete by: As directed      Allergies as of 07/25/2020      Reactions   Adalat [nifedipine]  Other (See Comments)   Unknown   Hydrocodone Itching, Nausea And Vomiting   Meperidine Hcl Other (See Comments)   Demerol causes hallucinations   Naproxen Nausea And Vomiting      Medication List    STOP taking these medications   Albuterol Sulfate 108 (90 Base) MCG/ACT Aepb Commonly known as: ProAir RespiClick   bimatoprost 8.85 % Soln Commonly known as: LUMIGAN   dorzolamide-timolol 22.3-6.8 MG/ML ophthalmic solution Commonly known as: COSOPT   Xarelto 15 MG Tabs tablet Generic drug: Rivaroxaban     TAKE these medications   brimonidine 0.1 % Soln Commonly known as: ALPHAGAN P Place 1 drop into both eyes in the morning, at noon, and at bedtime.   cetirizine 10 MG tablet Commonly known as: ZYRTEC Take 10 mg by mouth at bedtime.   famotidine 40 MG tablet Commonly known as: PEPCID Take 40 mg by mouth daily.   FLUoxetine 40 MG capsule Commonly known as: PROZAC Take 40 mg by mouth daily.   losartan 50 MG tablet Commonly known as: COZAAR Take 50 mg by mouth daily.   methocarbamol 500 MG tablet Commonly known as: ROBAXIN Take 1 tablet by mouth 3 (three) times daily as needed for muscle spasms.   montelukast 10 MG tablet Commonly known as: SINGULAIR Take 10 mg by mouth at bedtime.   nystatin ointment Commonly known as: MYCOSTATIN Apply 1 application topically 2 (two) times daily as needed (for rash).   nystatin powder Commonly known as: MYCOSTATIN/NYSTOP Apply 1 g topically daily as needed (for rash).   ondansetron 4 MG tablet Commonly known as: ZOFRAN Take 1 tablet (4 mg total) by mouth every 8 (eight) hours as needed for nausea or vomiting.   PriLOSEC 40 MG capsule Generic drug: omeprazole Take 40 mg by mouth daily.   rosuvastatin 40 MG tablet Commonly known as: CRESTOR Take 40 mg by mouth daily.   traMADol 50 MG tablet Commonly known as: ULTRAM Take 50 mg by mouth 3 (three) times daily as needed.       Follow-up Information    Fanny Bien, MD. Schedule an appointment as soon as possible for a visit in 1 week(s).   Specialty: Family Medicine Contact information: [address] [place] [address] [phone]        Lorretta Harp, MD. Go on 08/19/2020.   Specialties: Cardiology, Radiology Contact information: [address] [place] [address] [phone]        Gaynelle Arabian, MD Follow up.   Specialty: Orthopedic Surgery Contact information: [address]  [place] [address] [phone]              Allergies  Allergen Reactions  . Adalat [Nifedipine] Other (See Comments)    Unknown  . Hydrocodone Itching and Nausea And Vomiting  . Meperidine Hcl Other (  See Comments)    Demerol causes hallucinations  . Naproxen Nausea And Vomiting    Consultations:  Cardiology   Procedures/Studies: CT Angio Chest PE W and/or Wo Contrast  Result Date: 07/24/2020 CLINICAL DATA:  Shortness of breath and chest pain starting yesterday. Elevated D-dimer. EXAM: CT ANGIOGRAPHY CHEST WITH CONTRAST TECHNIQUE: Multidetector CT imaging of the chest was performed using the standard protocol during bolus administration of intravenous contrast. Multiplanar CT image reconstructions and MIPs were obtained to evaluate the vascular anatomy. CONTRAST:  140m OMNIPAQUE IOHEXOL 350 MG/ML SOLN COMPARISON:  Chest radiograph 06/26/2018.  CTA chest 08/07/2009 FINDINGS: Cardiovascular: The quality of this exam for evaluation of pulmonary embolism is moderate to good. The bolus is well timed. There is motion degradation in the mid and lower chest. Given this degradation, no pulmonary embolism identified. Aortic atherosclerosis. Tortuous thoracic aorta. Moderate cardiomegaly, without pericardial effusion. Pulmonary artery enlargement, outflow tract 3.5 cm. Mediastinum/Nodes: No mediastinal or hilar adenopathy. Small hiatal hernia with surgical changes at the proximal stomach.  Lungs/Pleura: No pleural fluid.  Clear lungs. Upper Abdomen: Motion degradation continuing into the upper abdomen. Normal imaged portions of the liver, spleen, pancreas, gallbladder, adrenal glands, left kidney. Right renal cortical thinning. Extensive colonic diverticulosis. Musculoskeletal: No acute osseous abnormality. Review of the MIP images confirms the above findings. IMPRESSION: 1. Mild limitations secondary to motion. No pulmonary embolism given these limitations. 2.  No acute process in the chest. 3.  Aortic Atherosclerosis (ICD10-I70.0). 4. Small hiatal hernia. 5. Pulmonary artery enlargement suggests pulmonary arterial hypertension. Electronically Signed   By: KAbigail MiyamotoM.D.   On: 07/24/2020 04:56   ECHOCARDIOGRAM COMPLETE  Result Date: 07/24/2020    ECHOCARDIOGRAM REPORT   Patient Name:   JSARAHY CREEDONDate of Exam: 07/24/2020 Medical Rec #:  [id number]  Height:       70.0 in Accession #:    [id number] Weight:       200.0 lb Date of Birth:  103-07-1941 BSA:          2.087 m Patient Age:    89years    BP:           177/64 mmHg Patient Gender: F           HR:           49 bpm. Exam Location:  Inpatient Procedure: 2D Echo Indications:    dyspnea 786.09  History:        Patient has no prior history of Echocardiogram examinations.                 Risk Factors:Dyslipidemia and Hypertension.  Sonographer:    VJannett CelestineRDCS (AE) Referring Phys: [id number] 1. Left ventricular ejection fraction, by estimation, is 60 to 65%. The left ventricle has normal function. The left ventricle has no regional wall motion abnormalities. There is mild left ventricular hypertrophy of the basal-septal segment. Left ventricular diastolic parameters are consistent with Grade I diastolic dysfunction (impaired relaxation).  2. Right ventricular systolic function is normal. The right ventricular size is normal.  3. Left atrial size was severely dilated.  4. The mitral valve is normal in structure.  Trivial mitral valve regurgitation. No evidence of mitral stenosis.  5. The aortic valve is tricuspid. Aortic valve regurgitation is not visualized. No aortic stenosis is present.  6. There is mild dilatation of the ascending aorta, measuring 36 mm.  7. The inferior vena cava is normal in size  with greater than 50% respiratory variability, suggesting right atrial pressure of 3 mmHg. FINDINGS  Left Ventricle: Left ventricular ejection fraction, by estimation, is 60 to 65%. The left ventricle has normal function. The left ventricle has no regional wall motion abnormalities. The left ventricular internal cavity size was normal in size. There is  mild left ventricular hypertrophy of the basal-septal segment. Left ventricular diastolic parameters are consistent with Grade I diastolic dysfunction (impaired relaxation). Normal left ventricular filling pressure. Right Ventricle: The right ventricular size is normal. No increase in right ventricular wall thickness. Right ventricular systolic function is normal. Left Atrium: Left atrial size was severely dilated. Right Atrium: Right atrial size was normal in size. Pericardium: There is no evidence of pericardial effusion. Mitral Valve: The mitral valve is normal in structure. Trivial mitral valve regurgitation. No evidence of mitral valve stenosis. Tricuspid Valve: The tricuspid valve is normal in structure. Tricuspid valve regurgitation is not demonstrated. No evidence of tricuspid stenosis. Aortic Valve: The aortic valve is tricuspid. Aortic valve regurgitation is not visualized. No aortic stenosis is present. Pulmonic Valve: The pulmonic valve was normal in structure. Pulmonic valve regurgitation is not visualized. No evidence of pulmonic stenosis. Aorta: The aortic root is normal in size and structure. There is mild dilatation of the ascending aorta, measuring 36 mm. Venous: The inferior vena cava is normal in size with greater than 50% respiratory variability, suggesting  right atrial pressure of 3 mmHg. IAS/Shunts: No atrial level shunt detected by color flow Doppler.  LEFT VENTRICLE PLAX 2D LVIDd:         4.20 cm  Diastology LVIDs:         2.40 cm  LV e' medial:    8.38 cm/s LV PW:         1.00 cm  LV E/e' medial:  7.1 LV IVS:        1.10 cm  LV e' lateral:   7.83 cm/s LVOT diam:     2.30 cm  LV E/e' lateral: 7.5 LV SV:         108 LV SV Index:   52 LVOT Area:     4.15 cm  LEFT ATRIUM            Index LA diam:      3.70 cm  1.77 cm/m LA Vol (A2C): 108.0 ml 51.74 ml/m  AORTIC VALVE LVOT Vmax:   98.60 cm/s LVOT Vmean:  63.800 cm/s LVOT VTI:    0.260 m  AORTA Ao Root diam: 3.60 cm MITRAL VALVE MV Area (PHT): 1.62 cm    SHUNTS MV Decel Time: 468 msec    Systemic VTI:  0.26 m MV E velocity: 59.10 cm/s  Systemic Diam: 2.30 cm MV A velocity: 64.70 cm/s MV E/A ratio:  0.91 Skeet Latch MD Electronically signed by Skeet Latch MD Signature Date/Time: 07/24/2020/12:38:24 PM    Final    VAS [place] LOWER EXTREMITY VENOUS (DVT)  Result Date: 07/24/2020  Lower Venous DVTStudy Indications: Elevated d-dimer.  Limitations: Body habitus and poor ultrasound/tissue interface. Comparison Study: No prior study Performing Technologist: Maudry Mayhew MHA, RDMS, RVT, RDCS  Examination Guidelines: A complete evaluation includes B-mode imaging, spectral Doppler, color Doppler, and power Doppler as needed of all accessible portions of each vessel. Bilateral testing is considered an integral part of a complete examination. Limited examinations for reoccurring indications may be performed as noted. The reflux portion of the exam is performed with the patient in reverse Trendelenburg.  +---------+---------------+---------+-----------+----------+--------------+ RIGHT  CompressibilityPhasicitySpontaneityPropertiesThrombus Aging +---------+---------------+---------+-----------+----------+--------------+ CFV      Full           Yes      Yes                                  +---------+---------------+---------+-----------+----------+--------------+ SFJ      Full                                                        +---------+---------------+---------+-----------+----------+--------------+ FV Prox  Full                                                        +---------+---------------+---------+-----------+----------+--------------+ FV Mid   Full                                                        +---------+---------------+---------+-----------+----------+--------------+ FV DistalFull                                                        +---------+---------------+---------+-----------+----------+--------------+ PFV      Full                                                        +---------+---------------+---------+-----------+----------+--------------+ POP      Full           Yes      Yes                                 +---------+---------------+---------+-----------+----------+--------------+ PTV      Full                    Yes                                 +---------+---------------+---------+-----------+----------+--------------+   Right Technical Findings: Not visualized segments include peroneal veins.  +---------+---------------+---------+-----------+----------+--------------+ LEFT     CompressibilityPhasicitySpontaneityPropertiesThrombus Aging +---------+---------------+---------+-----------+----------+--------------+ CFV      Full           Yes      Yes                                 +---------+---------------+---------+-----------+----------+--------------+ SFJ      Full                                                        +---------+---------------+---------+-----------+----------+--------------+  FV Prox  Full                                                        +---------+---------------+---------+-----------+----------+--------------+ FV Mid   Full                                                         +---------+---------------+---------+-----------+----------+--------------+ FV DistalFull                                                        +---------+---------------+---------+-----------+----------+--------------+ PFV      Full                                                        +---------+---------------+---------+-----------+----------+--------------+ POP      Full           Yes      Yes                                 +---------+---------------+---------+-----------+----------+--------------+ PTV      Full                                                        +---------+---------------+---------+-----------+----------+--------------+ PERO     Full                                                        +---------+---------------+---------+-----------+----------+--------------+     Summary: RIGHT: - There is no evidence of deep vein thrombosis in the lower extremity. However, portions of this examination were limited- see technologist comments above.  - No cystic structure found in the popliteal fossa.  LEFT: - There is no evidence of deep vein thrombosis in the lower extremity.  - No cystic structure found in the popliteal fossa.  *See table(s) above for measurements and observations.    Preliminary       Discharge Exam: Vitals:   07/25/20 0200 07/25/20 0447  BP: (!) 160/74 112/67  Pulse: (!) 54 (!) 56  Resp: 18 18  Temp: 98.4 F (36.9 C) 98.1 F (36.7 C)  SpO2: 100% 98%    General: Pt is alert, awake, not in acute distress Cardiovascular: RRR, S1/S2 +, no edema Respiratory: CTA bilaterally, no wheezing, no rhonchi, no respiratory distress, no conversational dyspnea  Abdominal: Soft, NT, ND, bowel sounds + Extremities: no edema, no cyanosis Psych: Normal mood and affect, stable judgement and insight  The results of significant diagnostics from this hospitalization (including imaging, microbiology, ancillary and  laboratory) are listed below for reference.     Microbiology: Recent Results (from the past 240 hour(s))  Respiratory Panel by RT PCR (Flu A&B, Covid) - Nasopharyngeal Swab     Status: None   Collection Time: 07/24/20  1:29 AM   Specimen: Nasopharyngeal Swab  Result Value Ref Range Status   SARS Coronavirus 2 by RT PCR NEGATIVE NEGATIVE Final    Comment: (NOTE) SARS-CoV-2 target nucleic acids are NOT DETECTED.  The SARS-CoV-2 RNA is generally detectable in upper respiratoy specimens during the acute phase of infection. The lowest concentration of SARS-CoV-2 viral copies this assay can detect is 131 copies/mL. A negative result does not preclude SARS-Cov-2 infection and should not be used as the sole basis for treatment or other patient management decisions. A negative result may occur with  improper specimen collection/handling, submission of specimen other than nasopharyngeal swab, presence of viral mutation(s) within the areas targeted by this assay, and inadequate number of viral copies (<131 copies/mL). A negative result must be combined with clinical observations, patient history, and epidemiological information. The expected result is Negative.  Fact Sheet for Patients:  PinkCheek.be  Fact Sheet for Healthcare Providers:  GravelBags.it  This test is no t yet approved or cleared by the [place] FDA and  has been authorized for detection and/or diagnosis of SARS-CoV-2 by FDA under an Emergency Use Authorization (EUA). This EUA will remain  in effect (meaning this test can be used) for the duration of the COVID-19 declaration under Section 564(b)(1) of the Act, 21 U.S.C. section 360bbb-3(b)(1), unless the authorization is terminated or revoked sooner.     Influenza A by PCR NEGATIVE NEGATIVE Final   Influenza B by PCR NEGATIVE NEGATIVE Final    Comment: (NOTE) The Xpert Xpress SARS-CoV-2/FLU/RSV assay is  intended as an aid in  the diagnosis of influenza from Nasopharyngeal swab specimens and  should not be used as a sole basis for treatment. Nasal washings and  aspirates are unacceptable for Xpert Xpress SARS-CoV-2/FLU/RSV  testing.  Fact Sheet for Patients: PinkCheek.be  Fact Sheet for Healthcare Providers: GravelBags.it  This test is not yet approved or cleared by the [place] FDA and  has been authorized for detection and/or diagnosis of SARS-CoV-2 by  FDA under an Emergency Use Authorization (EUA). This EUA will remain  in effect (meaning this test can be used) for the duration of the  Covid-19 declaration under Section 564(b)(1) of the Act, 21  U.S.C. section 360bbb-3(b)(1), unless the authorization is  terminated or revoked. Performed at [facility], [place] [address], [place], [place] [address]      Labs: BNP (last 3 results) Recent Labs    07/24/20 0917  BNP 47.6   Basic Metabolic Panel: Recent Labs  Lab 07/24/20 0127  NA 139  K 3.9  CL 103  CO2 26  GLUCOSE 93  BUN 20  CREATININE 0.99  CALCIUM 9.4   Liver Function Tests: Recent Labs  Lab 07/24/20 0127  AST 19  ALT 13  ALKPHOS 73  BILITOT 0.7  PROT 7.6  ALBUMIN 4.1   No results for input(s): LIPASE, AMYLASE in the last 168 hours. No results for input(s): AMMONIA in the last 168 hours. CBC: Recent Labs  Lab 07/24/20 0127  WBC 4.9  NEUTROABS 2.0  HGB 13.4  HCT 41.3  MCV 97.2  PLT 165   Cardiac Enzymes: No results for input(s):  CKTOTAL, CKMB, CKMBINDEX, TROPONINI in the last 168 hours. BNP: Invalid input(s): POCBNP CBG: No results for input(s): GLUCAP in the last 168 hours. D-Dimer No results for input(s): DDIMER in the last 72 hours. Hgb A1c No results for input(s): HGBA1C in the last 72 hours. Lipid Profile Recent Labs    07/24/20 2044  CHOL 218*  HDL 68  LDLCALC 137*  TRIG 66  CHOLHDL 3.2    Thyroid function studies Recent Labs    07/24/20 0926  TSH 1.343   Anemia work up No results for input(s): VITAMINB12, FOLATE, FERRITIN, TIBC, IRON, RETICCTPCT in the last 72 hours. Urinalysis    Component Value Date/Time   COLORURINE STRAW (A) 06/27/2018 0811   APPEARANCEUR CLEAR 06/27/2018 0811   LABSPEC 1.008 06/27/2018 0811   PHURINE 9.0 (H) 06/27/2018 0811   GLUCOSEU NEGATIVE 06/27/2018 0811   HGBUR SMALL (A) 06/27/2018 0811   BILIRUBINUR NEGATIVE 06/27/2018 0811   KETONESUR NEGATIVE 06/27/2018 0811   PROTEINUR NEGATIVE 06/27/2018 0811   UROBILINOGEN 0.2 06/09/2015 1931   NITRITE NEGATIVE 06/27/2018 0811   LEUKOCYTESUR NEGATIVE 06/27/2018 0811   Sepsis Labs Invalid input(s): PROCALCITONIN,  WBC,  LACTICIDVEN Microbiology Recent Results (from the past 240 hour(s))  Respiratory Panel by RT PCR (Flu A&B, Covid) - Nasopharyngeal Swab     Status: None   Collection Time: 07/24/20  1:29 AM   Specimen: Nasopharyngeal Swab  Result Value Ref Range Status   SARS Coronavirus 2 by RT PCR NEGATIVE NEGATIVE Final    Comment: (NOTE) SARS-CoV-2 target nucleic acids are NOT DETECTED.  The SARS-CoV-2 RNA is generally detectable in upper respiratoy specimens during the acute phase of infection. The lowest concentration of SARS-CoV-2 viral copies this assay can detect is 131 copies/mL. A negative result does not preclude SARS-Cov-2 infection and should not be used as the sole basis for treatment or other patient management decisions. A negative result may occur with  improper specimen collection/handling, submission of specimen other than nasopharyngeal swab, presence of viral mutation(s) within the areas targeted by this assay, and inadequate number of viral copies (<131 copies/mL). A negative result must be combined with clinical observations, patient history, and epidemiological information. The expected result is Negative.  Fact Sheet for Patients:   PinkCheek.be  Fact Sheet for Healthcare Providers:  GravelBags.it  This test is no t yet approved or cleared by the [place] FDA and  has been authorized for detection and/or diagnosis of SARS-CoV-2 by FDA under an Emergency Use Authorization (EUA). This EUA will remain  in effect (meaning this test can be used) for the duration of the COVID-19 declaration under Section 564(b)(1) of the Act, 21 U.S.C. section 360bbb-3(b)(1), unless the authorization is terminated or revoked sooner.     Influenza A by PCR NEGATIVE NEGATIVE Final   Influenza B by PCR NEGATIVE NEGATIVE Final    Comment: (NOTE) The Xpert Xpress SARS-CoV-2/FLU/RSV assay is intended as an aid in  the diagnosis of influenza from Nasopharyngeal swab specimens and  should not be used as a sole basis for treatment. Nasal washings and  aspirates are unacceptable for Xpert Xpress SARS-CoV-2/FLU/RSV  testing.  Fact Sheet for Patients: PinkCheek.be  Fact Sheet for Healthcare Providers: GravelBags.it  This test is not yet approved or cleared by the [place] FDA and  has been authorized for detection and/or diagnosis of SARS-CoV-2 by  FDA under an Emergency Use Authorization (EUA). This EUA will remain  in effect (meaning this test can be used) for the duration of  the  Covid-19 declaration under Section 564(b)(1) of the Act, 21  U.S.C. section 360bbb-3(b)(1), unless the authorization is  terminated or revoked. Performed at [facility], [place] [address] [place], [place] [address]      Patient was seen and examined on the day of discharge and was found to be in stable condition. Time coordinating discharge: 35 minutes including assessment and coordination of care, as well as examination of the patient.   SIGNED:  Dessa Phi, DO [facility] 07/25/2020, 10:26 AM

## 2020-07-25 NOTE — Progress Notes (Addendum)
Progress Note  Patient Name: [name] Date of Encounter: 07/25/2020  [facility] Cardiologist: Quay Burow, MD   Subjective   No chest pain and no SOB was wondering why D dimer elevated   Inpatient Medications    Scheduled Meds:  brimonidine  1 drop Both Eyes TID   famotidine  40 mg Oral Daily   FLUoxetine  40 mg Oral Daily   loratadine  10 mg Oral Daily   losartan  50 mg Oral Daily   montelukast  10 mg Oral QHS   pantoprazole  40 mg Oral Daily   rosuvastatin  40 mg Oral Daily   Continuous Infusions:   PRN Meds: acetaminophen, hydrALAZINE, ondansetron (ZOFRAN) IV   Vital Signs    Vitals:   07/24/20 2015 07/24/20 2039 07/25/20 0200 07/25/20 0447  BP:  (!) 165/59 (!) 160/74 112/67  Pulse:  (!) 50 (!) 54 (!) 56  Resp:  20 18 18   Temp: 98 F (36.7 C) 98.7 F (37.1 C) 98.4 F (36.9 C) 98.1 F (36.7 C)  TempSrc: Oral Oral Oral Oral  SpO2:  99% 100% 98%  Weight:      Height:       No intake or output data in the 24 hours ending 07/25/20 0931 Last 3 Weights 07/24/2020 02/15/2020 08/19/2019  Weight (lbs) 200 lb 209 lb 12.8 oz 202 lb 12.8 oz  Weight (kg) 90.719 kg 95.165 kg 91.989 kg      Telemetry    SR - Personally Reviewed  ECG    No new - Personally Reviewed  Physical Exam  Exam per Dr. Johnsie Cancel  GEN: No acute distress.   Neck: No JVD Cardiac: RRR, no murmurs, rubs, or gallops.  Respiratory: Clear to auscultation bilaterally. GI: Soft, nontender, non-distended  MS: No edema; No deformity. Neuro:  Nonfocal  Psych: Normal affect   Labs    High Sensitivity Troponin:   Recent Labs  Lab 07/24/20 0127 07/24/20 0328  TROPONINIHS 5 7      Chemistry Recent Labs  Lab 07/24/20 0127  NA 139  K 3.9  CL 103  CO2 26  GLUCOSE 93  BUN 20  CREATININE 0.99  CALCIUM 9.4  PROT 7.6  ALBUMIN 4.1  AST 19  ALT 13  ALKPHOS 73  BILITOT 0.7  GFRNONAA 55*  ANIONGAP 10     Hematology Recent Labs  Lab 07/24/20 0127  WBC 4.9  RBC 4.25  HGB  13.4  HCT 41.3  MCV 97.2  MCH 31.5  MCHC 32.4  RDW 13.2  PLT 165    BNP Recent Labs  Lab 07/24/20 0917  BNP 88.8     DDimer No results for input(s): DDIMER in the last 168 hours.   Radiology    CT Angio Chest PE W and/or Wo Contrast  Result Date: 07/24/2020 CLINICAL DATA:  Shortness of breath and chest pain starting yesterday. Elevated D-dimer. EXAM: CT ANGIOGRAPHY CHEST WITH CONTRAST TECHNIQUE: Multidetector CT imaging of the chest was performed using the standard protocol during bolus administration of intravenous contrast. Multiplanar CT image reconstructions and MIPs were obtained to evaluate the vascular anatomy. CONTRAST:  139mL OMNIPAQUE IOHEXOL 350 MG/ML SOLN COMPARISON:  Chest radiograph 06/26/2018.  CTA chest 08/07/2009 FINDINGS: Cardiovascular: The quality of this exam for evaluation of pulmonary embolism is moderate to good. The bolus is well timed. There is motion degradation in the mid and lower chest. Given this degradation, no pulmonary embolism identified. Aortic atherosclerosis. Tortuous thoracic aorta. Moderate cardiomegaly,  without pericardial effusion. Pulmonary artery enlargement, outflow tract 3.5 cm. Mediastinum/Nodes: No mediastinal or hilar adenopathy. Small hiatal hernia with surgical changes at the proximal stomach. Lungs/Pleura: No pleural fluid.  Clear lungs. Upper Abdomen: Motion degradation continuing into the upper abdomen. Normal imaged portions of the liver, spleen, pancreas, gallbladder, adrenal glands, left kidney. Right renal cortical thinning. Extensive colonic diverticulosis. Musculoskeletal: No acute osseous abnormality. Review of the MIP images confirms the above findings. IMPRESSION: 1. Mild limitations secondary to motion. No pulmonary embolism given these limitations. 2.  No acute process in the chest. 3.  Aortic Atherosclerosis (ICD10-I70.0). 4. Small hiatal hernia. 5. Pulmonary artery enlargement suggests pulmonary arterial hypertension.  Electronically Signed   By: Abigail Miyamoto M.D.   On: 07/24/2020 04:56   ECHOCARDIOGRAM COMPLETE  Result Date: 07/24/2020    ECHOCARDIOGRAM REPORT   Patient Name:   [name] Date of Exam: 07/24/2020 Medical Rec #:  [id number]   Height:       70.0 in Accession #:    [id number]  Weight:       200.0 lb Date of Birth:  1941/05/06  BSA:          2.087 m Patient Age:    79 years    BP:           177/64 mmHg Patient Gender: F           HR:           49 bpm. Exam Location:  Inpatient Procedure: 2D Echo Indications:    dyspnea 786.09  History:        Patient has no prior history of Echocardiogram examinations.                 Risk Factors:Dyslipidemia and Hypertension.  Sonographer:    Jannett Celestine RDCS (AE) Referring Phys: [id number] Brock  1. Left ventricular ejection fraction, by estimation, is 60 to 65%. The left ventricle has normal function. The left ventricle has no regional wall motion abnormalities. There is mild left ventricular hypertrophy of the basal-septal segment. Left ventricular diastolic parameters are consistent with Grade I diastolic dysfunction (impaired relaxation).  2. Right ventricular systolic function is normal. The right ventricular size is normal.  3. Left atrial size was severely dilated.  4. The mitral valve is normal in structure. Trivial mitral valve regurgitation. No evidence of mitral stenosis.  5. The aortic valve is tricuspid. Aortic valve regurgitation is not visualized. No aortic stenosis is present.  6. There is mild dilatation of the ascending aorta, measuring 36 mm.  7. The inferior vena cava is normal in size with greater than 50% respiratory variability, suggesting right atrial pressure of 3 mmHg. FINDINGS  Left Ventricle: Left ventricular ejection fraction, by estimation, is 60 to 65%. The left ventricle has normal function. The left ventricle has no regional wall motion abnormalities. The left ventricular internal cavity size was normal in size. There is   mild left ventricular hypertrophy of the basal-septal segment. Left ventricular diastolic parameters are consistent with Grade I diastolic dysfunction (impaired relaxation). Normal left ventricular filling pressure. Right Ventricle: The right ventricular size is normal. No increase in right ventricular wall thickness. Right ventricular systolic function is normal. Left Atrium: Left atrial size was severely dilated. Right Atrium: Right atrial size was normal in size. Pericardium: There is no evidence of pericardial effusion. Mitral Valve: The mitral valve is normal in structure. Trivial mitral valve regurgitation. No evidence of mitral valve stenosis. Tricuspid Valve:  The tricuspid valve is normal in structure. Tricuspid valve regurgitation is not demonstrated. No evidence of tricuspid stenosis. Aortic Valve: The aortic valve is tricuspid. Aortic valve regurgitation is not visualized. No aortic stenosis is present. Pulmonic Valve: The pulmonic valve was normal in structure. Pulmonic valve regurgitation is not visualized. No evidence of pulmonic stenosis. Aorta: The aortic root is normal in size and structure. There is mild dilatation of the ascending aorta, measuring 36 mm. Venous: The inferior vena cava is normal in size with greater than 50% respiratory variability, suggesting right atrial pressure of 3 mmHg. IAS/Shunts: No atrial level shunt detected by color flow Doppler.  LEFT VENTRICLE PLAX 2D LVIDd:         4.20 cm  Diastology LVIDs:         2.40 cm  LV e' medial:    8.38 cm/s LV PW:         1.00 cm  LV E/e' medial:  7.1 LV IVS:        1.10 cm  LV e' lateral:   7.83 cm/s LVOT diam:     2.30 cm  LV E/e' lateral: 7.5 LV SV:         108 LV SV Index:   52 LVOT Area:     4.15 cm  LEFT ATRIUM            Index LA diam:      3.70 cm  1.77 cm/m LA Vol (A2C): 108.0 ml 51.74 ml/m  AORTIC VALVE LVOT Vmax:   98.60 cm/s LVOT Vmean:  63.800 cm/s LVOT VTI:    0.260 m  AORTA Ao Root diam: 3.60 cm MITRAL VALVE MV Area (PHT):  1.62 cm    SHUNTS MV Decel Time: 468 msec    Systemic VTI:  0.26 m MV E velocity: 59.10 cm/s  Systemic Diam: 2.30 cm MV A velocity: 64.70 cm/s MV E/A ratio:  0.91 Skeet Latch MD Electronically signed by Skeet Latch MD Signature Date/Time: 07/24/2020/12:38:24 PM    Final    VAS [place] LOWER EXTREMITY VENOUS (DVT)  Result Date: 07/24/2020  Lower Venous DVTStudy Indications: Elevated d-dimer.  Limitations: Body habitus and poor ultrasound/tissue interface. Comparison Study: No prior study Performing Technologist: Maudry Mayhew MHA, RDMS, RVT, RDCS  Examination Guidelines: A complete evaluation includes B-mode imaging, spectral Doppler, color Doppler, and power Doppler as needed of all accessible portions of each vessel. Bilateral testing is considered an integral part of a complete examination. Limited examinations for reoccurring indications may be performed as noted. The reflux portion of the exam is performed with the patient in reverse Trendelenburg.  +---------+---------------+---------+-----------+----------+--------------+  RIGHT     Compressibility Phasicity Spontaneity Properties Thrombus Aging  +---------+---------------+---------+-----------+----------+--------------+  CFV       Full            Yes       Yes                                    +---------+---------------+---------+-----------+----------+--------------+  SFJ       Full                                                             +---------+---------------+---------+-----------+----------+--------------+  FV Prox  Full                                                             +---------+---------------+---------+-----------+----------+--------------+  FV Mid    Full                                                             +---------+---------------+---------+-----------+----------+--------------+  FV Distal Full                                                              +---------+---------------+---------+-----------+----------+--------------+  PFV       Full                                                             +---------+---------------+---------+-----------+----------+--------------+  POP       Full            Yes       Yes                                    +---------+---------------+---------+-----------+----------+--------------+  PTV       Full                      Yes                                    +---------+---------------+---------+-----------+----------+--------------+   Right Technical Findings: Not visualized segments include peroneal veins.  +---------+---------------+---------+-----------+----------+--------------+  LEFT      Compressibility Phasicity Spontaneity Properties Thrombus Aging  +---------+---------------+---------+-----------+----------+--------------+  CFV       Full            Yes       Yes                                    +---------+---------------+---------+-----------+----------+--------------+  SFJ       Full                                                             +---------+---------------+---------+-----------+----------+--------------+  FV Prox   Full                                                             +---------+---------------+---------+-----------+----------+--------------+  FV Mid    Full                                                             +---------+---------------+---------+-----------+----------+--------------+  FV Distal Full                                                             +---------+---------------+---------+-----------+----------+--------------+  PFV       Full                                                             +---------+---------------+---------+-----------+----------+--------------+  POP       Full            Yes       Yes                                    +---------+---------------+---------+-----------+----------+--------------+  PTV       Full                                                              +---------+---------------+---------+-----------+----------+--------------+  PERO      Full                                                             +---------+---------------+---------+-----------+----------+--------------+     Summary: RIGHT: - There is no evidence of deep vein thrombosis in the lower extremity. However, portions of this examination were limited- see technologist comments above.  - No cystic structure found in the popliteal fossa.  LEFT: - There is no evidence of deep vein thrombosis in the lower extremity.  - No cystic structure found in the popliteal fossa.  *See table(s) above for measurements and observations.    Preliminary     Cardiac Studies   07/24/20 Echo 1. Left ventricular ejection fraction, by estimation, is 60 to 65%. The  left ventricle has normal function. The left ventricle has no regional  wall motion abnormalities. There is mild left ventricular hypertrophy of  the basal-septal segment. Left  ventricular diastolic parameters are consistent with Grade I diastolic  dysfunction (impaired relaxation).   2. Right ventricular systolic function is normal. The right ventricular  size is normal.   3. Left atrial size was severely dilated.   4. The mitral valve is normal in structure. Trivial mitral valve  regurgitation. No evidence of mitral stenosis.   5. The aortic valve is tricuspid.  Aortic valve regurgitation is not  visualized. No aortic stenosis is present.   6. There is mild dilatation of the ascending aorta, measuring 36 mm.   7. The inferior vena cava is normal in size with greater than 50%  respiratory variability, suggesting right atrial pressure of 3 mmHg.   FINDINGS   Left Ventricle: Left ventricular ejection fraction, by estimation, is 60  to 65%. The left ventricle has normal function. The left ventricle has no  regional wall motion abnormalities. The left ventricular internal cavity  size was normal in size. There  is   mild left ventricular hypertrophy of the basal-septal segment. Left  ventricular diastolic parameters are consistent with Grade I diastolic  dysfunction (impaired relaxation). Normal left ventricular filling  pressure.   Right Ventricle: The right ventricular size is normal. No increase in  right ventricular wall thickness. Right ventricular systolic function is  normal.   Left Atrium: Left atrial size was severely dilated.   Right Atrium: Right atrial size was normal in size.   Pericardium: There is no evidence of pericardial effusion.   Mitral Valve: The mitral valve is normal in structure. Trivial mitral  valve regurgitation. No evidence of mitral valve stenosis.   Tricuspid Valve: The tricuspid valve is normal in structure. Tricuspid  valve regurgitation is not demonstrated. No evidence of tricuspid  stenosis.   Aortic Valve: The aortic valve is tricuspid. Aortic valve regurgitation is  not visualized. No aortic stenosis is present.   Pulmonic Valve: The pulmonic valve was normal in structure. Pulmonic valve  regurgitation is not visualized. No evidence of pulmonic stenosis.   Aorta: The aortic root is normal in size and structure. There is mild  dilatation of the ascending aorta, measuring 36 mm.   Venous: The inferior vena cava is normal in size with greater than 50%  respiratory variability, suggesting right atrial pressure of 3 mmHg.   IAS/Shunts: No atrial level shunt detected by color flow Doppler.   Patient Profile     79 y.o. female with a hx of HLD, HTN, DJD, depression,  Fibromyalgia, GERD and sleep apnea now admitted with SOB, chest pain.    Assessment & Plan    # DOE and chest pain with neg CTA of chest for PE, though suggestions of PAH.  --BNP 88.8 --troponin neg. (5-7) --Echo with normal EF valves stable. --sed rate low Dr. Johnsie Cancel has seen  # HLD with LDL of 137 on crestor 40 daily may need higher dose or add zetia  # GERD with hx of Nissen  fundoplication   # HTN improved today at 112/67        For questions or updates, please contact [place] Please consult [email or web address] for contact info under        Signed, Cecilie Kicks, NP  07/25/2020, 9:31 AM    Patient examined chart reviewed Exam with clear lungs no JVP elevation no murmur and no edema post TKRls Sats are fine No evidence of acute cardiac event CT negative PE, echo normal EF no RWMA;s r/o BNP normal no further cardiac w/u needed Ok to d/c home from our standpoint Will arrange outpatient f/u with Dr Shiela Mayer MD [facility]

## 2020-07-27 DIAGNOSIS — F411 Generalized anxiety disorder: Secondary | ICD-10-CM | POA: Diagnosis not present

## 2020-07-27 DIAGNOSIS — E559 Vitamin D deficiency, unspecified: Secondary | ICD-10-CM | POA: Diagnosis not present

## 2020-07-27 DIAGNOSIS — I152 Hypertension secondary to endocrine disorders: Secondary | ICD-10-CM | POA: Diagnosis not present

## 2020-07-27 DIAGNOSIS — I10 Essential (primary) hypertension: Secondary | ICD-10-CM | POA: Diagnosis not present

## 2020-07-27 DIAGNOSIS — E782 Mixed hyperlipidemia: Secondary | ICD-10-CM | POA: Diagnosis not present

## 2020-07-27 DIAGNOSIS — E1165 Type 2 diabetes mellitus with hyperglycemia: Secondary | ICD-10-CM | POA: Diagnosis not present

## 2020-08-01 DIAGNOSIS — Z23 Encounter for immunization: Secondary | ICD-10-CM | POA: Diagnosis not present

## 2020-08-09 DIAGNOSIS — M25661 Stiffness of right knee, not elsewhere classified: Secondary | ICD-10-CM | POA: Diagnosis not present

## 2020-08-09 DIAGNOSIS — M25561 Pain in right knee: Secondary | ICD-10-CM | POA: Diagnosis not present

## 2020-08-09 DIAGNOSIS — H4312 Vitreous hemorrhage, left eye: Secondary | ICD-10-CM | POA: Diagnosis not present

## 2020-08-09 DIAGNOSIS — H401133 Primary open-angle glaucoma, bilateral, severe stage: Secondary | ICD-10-CM | POA: Diagnosis not present

## 2020-08-09 DIAGNOSIS — H59032 Cystoid macular edema following cataract surgery, left eye: Secondary | ICD-10-CM | POA: Diagnosis not present

## 2020-08-09 DIAGNOSIS — H43393 Other vitreous opacities, bilateral: Secondary | ICD-10-CM | POA: Diagnosis not present

## 2020-08-11 DIAGNOSIS — M25561 Pain in right knee: Secondary | ICD-10-CM | POA: Diagnosis not present

## 2020-08-15 DIAGNOSIS — M25561 Pain in right knee: Secondary | ICD-10-CM | POA: Diagnosis not present

## 2020-08-19 ENCOUNTER — Encounter: Payer: Self-pay | Admitting: Cardiovascular Disease

## 2020-08-19 ENCOUNTER — Ambulatory Visit (INDEPENDENT_AMBULATORY_CARE_PROVIDER_SITE_OTHER): Payer: Medicare Other | Admitting: Cardiovascular Disease

## 2020-08-19 ENCOUNTER — Other Ambulatory Visit: Payer: Self-pay

## 2020-08-19 VITALS — BP 154/82 | HR 52 | Ht 70.0 in | Wt 211.0 lb

## 2020-08-19 DIAGNOSIS — I200 Unstable angina: Secondary | ICD-10-CM | POA: Diagnosis not present

## 2020-08-19 DIAGNOSIS — I10 Essential (primary) hypertension: Secondary | ICD-10-CM

## 2020-08-19 DIAGNOSIS — E785 Hyperlipidemia, unspecified: Secondary | ICD-10-CM | POA: Diagnosis not present

## 2020-08-19 DIAGNOSIS — E782 Mixed hyperlipidemia: Secondary | ICD-10-CM

## 2020-08-19 DIAGNOSIS — R0789 Other chest pain: Secondary | ICD-10-CM

## 2020-08-19 DIAGNOSIS — M25561 Pain in right knee: Secondary | ICD-10-CM | POA: Diagnosis not present

## 2020-08-19 NOTE — Assessment & Plan Note (Signed)
History of essential hypertension a blood pressure measured today 154/82.  She is on losartan.

## 2020-08-19 NOTE — Assessment & Plan Note (Signed)
[name] was recently admitted to [facility] on 07/24/2020 with atypical chest pain.  Her cardiac enzymes were negative.  Her EKG showed no acute changes.  Her 2D echo was essentially normal.  Chest CTA showed no evidence of pulmonary embolism and she was discharged home.  She said no recurrent episodes.

## 2020-08-19 NOTE — Patient Instructions (Signed)
Medication Instructions:  Your physician recommends that you continue on your current medications as directed. Please refer to the Current Medication list given to you today.  *If you need a refill on your cardiac medications before your next appointment, please call your pharmacy*   Lab Work:  Your physician recommends that you return for lab work in: 2 months for lipid and liver profile.   If you have labs (blood work) drawn today and your tests are completely normal, you will receive your results only by: [name] MyChart Message (if you have MyChart) OR . A paper copy in the mail If you have any lab test that is abnormal or we need to change your treatment, we will call you to review the results.  Follow-Up: At [facility], you and your health needs are our priority.  As part of our continuing mission to provide you with exceptional heart care, we have created designated Provider Care Teams.  These Care Teams include your primary Cardiologist (physician) and Advanced Practice Providers (APPs -  Physician Assistants and Nurse Practitioners) who all work together to provide you with the care you need, when you need it.  We recommend signing up for the patient portal called "MyChart".  Sign up information is provided on this After Visit Summary.  MyChart is used to connect with patients for Virtual Visits (Telemedicine).  Patients are able to view lab/test results, encounter notes, upcoming appointments, etc.  Non-urgent messages can be sent to your provider as well.   To learn more about what you can do with MyChart, go to [email or web address].    Your next appointment:   6 month(s)  The format for your next appointment:   In Person  Provider:   You will see one of the following Advanced Practice Providers on your designated Care Team:    Kerin Ransom, PA-C  [place], [place]  Coletta Memos, [place]  Then, Quay Burow, MD will plan to see you again in 12 month(s).

## 2020-08-19 NOTE — Progress Notes (Signed)
08/19/2020 Infantof [name]   12-22-1940  [id number]  Primary Physician Fanny Bien, MD Primary Cardiologist: Lorretta Harp MD FACP, [place], [place], [place]  HPI:  [name] is a 79 y.o.  mildly overweight, married [place] American female, mother of 65, grandmother to 67 grandchildren, whom I last saw on11/01/2019. She is accompanied by her daughter [name] today.  She is referred back today for preoperative clearance before elective right total knee replacement scheduled to be performed by Dr.Alusioon September 9. 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 She has a history of hyperlipidemia. She was admitted to [facility] on December 20, 2010, with presyncope. Her symptoms were heralded by an episode of nausea. Her other history is remarkable for degenerative joint disease, depression, and fibromyalgia as well as esophageal dysmotility, status post Nissen fundoplication in the past. A 2D echocardiogram was normal. She was complaining of some chest pain at that time, and a Myoview stress test performed in our office on January 22, 2011, was normal as well.An event monitor was performed That showed heart rates in the 40s especially while she was asleep.Marland KitchenRecently she's been symptomatic from this. She is on Timalol eyeddrops. .Dr. Carey Bullocks her blood work including lipid profile.  She  did have a right total knee replacement by Dr. Maureen Ralphs which she has had rehabilitation for and is walking better.   Since I saw her a year ago she was admitted to [facility] on 07/24/2020 with chest pain and shortness of breath.  Her cardiac enzymes were negative.  Her EKG showed no acute changes.  She was seen by Dr. Skeet Latch and Dr. Johnsie Cancel.  2D echo was unremarkable and chest CTA showed no evidence of pulmonary embolism.  She was discharged home and has had no recurrent symptoms.   Current Meds  Medication Sig  . brimonidine (ALPHAGAN P) 0.1 % SOLN Place 1 drop into  both eyes in the morning, at noon, and at bedtime.  . cetirizine (ZYRTEC) 10 MG tablet Take 10 mg by mouth at bedtime.  . famotidine (PEPCID) 40 MG tablet Take 40 mg by mouth daily.  [name] FLUoxetine (PROZAC) 40 MG capsule Take 40 mg by mouth daily.  [name] losartan (COZAAR) 50 MG tablet Take 50 mg by mouth daily.   . methocarbamol (ROBAXIN) 500 MG tablet Take 1 tablet by mouth 3 (three) times daily as needed for muscle spasms.  . montelukast (SINGULAIR) 10 MG tablet Take 10 mg by mouth at bedtime.  [name] nystatin (MYCOSTATIN/NYSTOP) powder Apply 1 g topically daily as needed (for rash).   . nystatin ointment (MYCOSTATIN) Apply 1 application topically 2 (two) times daily as needed (for rash).   [name] omeprazole (PRILOSEC) 40 MG capsule Take 40 mg by mouth daily.   . ondansetron (ZOFRAN) 4 MG tablet Take 1 tablet (4 mg total) by mouth every 8 (eight) hours as needed for nausea or vomiting.  . rosuvastatin (CRESTOR) 40 MG tablet Take 40 mg by mouth daily.   . traMADol (ULTRAM) 50 MG tablet Take 50 mg by mouth 3 (three) times daily as needed.     Allergies  Allergen Reactions  . Adalat [Nifedipine] Other (See Comments)    Unknown  . Hydrocodone Itching and Nausea And Vomiting  . Meperidine Hcl Other (See Comments)    Demerol causes hallucinations  . Naproxen Nausea And Vomiting    Social History   Socioeconomic History  . Marital status: Married    Spouse  name: Not on file  . Number of children: 8  . Years of education: Not on file  . Highest education level: Not on file  Occupational History  . Occupation: retired    Comment: retired Therapist, art.   Tobacco Use  . Smoking status: Never Smoker  . Smokeless tobacco: Never Used  Vaping Use  . Vaping Use: Never used  Substance and Sexual Activity  . Alcohol use: No    Alcohol/week: 0.0 standard drinks  . Drug use: No  . Sexual activity: Yes  Other Topics Concern  . Not on file  Social History Narrative   Husband, [name] is Next of  Kin. Cell # [phone]   Social Determinants of Health   Financial Resource Strain:   . Difficulty of Paying Living Expenses: Not on file  Food Insecurity:   . Worried About Charity fundraiser in the Last Year: Not on file  . Ran Out of Food in the Last Year: Not on file  Transportation Needs:   . Lack of Transportation (Medical): Not on file  . Lack of Transportation (Non-Medical): Not on file  Physical Activity:   . Days of Exercise per Week: Not on file  . Minutes of Exercise per Session: Not on file  Stress:   . Feeling of Stress : Not on file  Social Connections:   . Frequency of Communication with Friends and Family: Not on file  . Frequency of Social Gatherings with Friends and Family: Not on file  . Attends Religious Services: Not on file  . Active Member of Clubs or Organizations: Not on file  . Attends Archivist Meetings: Not on file  . Marital Status: Not on file  Intimate Partner Violence:   . Fear of Current or Ex-Partner: Not on file  . Emotionally Abused: Not on file  . Physically Abused: Not on file  . Sexually Abused: Not on file     Review of Systems: General: negative for chills, fever, night sweats or weight changes.  Cardiovascular: negative for chest pain, dyspnea on exertion, edema, orthopnea, palpitations, paroxysmal nocturnal dyspnea or shortness of breath Dermatological: negative for rash Respiratory: negative for cough or wheezing Urologic: negative for hematuria Abdominal: negative for nausea, vomiting, diarrhea, bright red blood per rectum, melena, or hematemesis Neurologic: negative for visual changes, syncope, or dizziness All other systems reviewed and are otherwise negative except as noted above.    Blood pressure (!) 154/82, pulse (!) 52, height 5\' 10"  (1.778 m), weight 211 lb (95.7 kg), SpO2 97 %.  General appearance: alert and no distress Neck: no adenopathy, no carotid bruit, no JVD, supple, symmetrical, trachea midline and  thyroid not enlarged, symmetric, no tenderness/mass/nodules Lungs: clear to auscultation bilaterally Heart: regular rate and rhythm, S1, S2 normal, no murmur, click, rub or gallop Extremities: extremities normal, atraumatic, no cyanosis or edema Pulses: 2+ and symmetric Skin: Skin color, texture, turgor normal. No rashes or lesions Neurologic: Alert and oriented X 3, normal strength and tone. Normal symmetric reflexes. Normal coordination and gait  EKG not performed today  ASSESSMENT AND PLAN:   HLD (hyperlipidemia) History of hyperlipidemia on high-dose rosuvastatin with recent lipid profile performed 07/24/2020 revealing total cholesterol 218, LDL 137 and HDL of 68.  She does admit to noncompliance with her lipid-lowering medications.  She is agreed to take them daily as prescribed and we will recheck a lipid liver profile in 2 months  Essential hypertension History of essential hypertension a blood pressure measured  today 154/82.  She is on losartan.  Atypical chest pain Ms. Bonnette was recently admitted to [facility] on 07/24/2020 with atypical chest pain.  Her cardiac enzymes were negative.  Her EKG showed no acute changes.  Her 2D echo was essentially normal.  Chest CTA showed no evidence of pulmonary embolism and she was discharged home.  She said no recurrent episodes.      Lorretta Harp MD FACP,FACC,FAHA, [facility] 08/19/2020 10:30 AM

## 2020-08-19 NOTE — Assessment & Plan Note (Signed)
History of hyperlipidemia on high-dose rosuvastatin with recent lipid profile performed 07/24/2020 revealing total cholesterol 218, LDL 137 and HDL of 68.  She does admit to noncompliance with her lipid-lowering medications.  She is agreed to take them daily as prescribed and we will recheck a lipid liver profile in 2 months

## 2020-08-23 DIAGNOSIS — M25561 Pain in right knee: Secondary | ICD-10-CM | POA: Diagnosis not present

## 2020-08-25 DIAGNOSIS — M25561 Pain in right knee: Secondary | ICD-10-CM | POA: Diagnosis not present

## 2020-08-29 DIAGNOSIS — M25561 Pain in right knee: Secondary | ICD-10-CM | POA: Diagnosis not present

## 2020-09-01 DIAGNOSIS — M25561 Pain in right knee: Secondary | ICD-10-CM | POA: Diagnosis not present

## 2020-09-02 DIAGNOSIS — E113212 Type 2 diabetes mellitus with mild nonproliferative diabetic retinopathy with macular edema, left eye: Secondary | ICD-10-CM | POA: Diagnosis not present

## 2020-09-02 DIAGNOSIS — H59032 Cystoid macular edema following cataract surgery, left eye: Secondary | ICD-10-CM | POA: Diagnosis not present

## 2020-09-02 DIAGNOSIS — H4312 Vitreous hemorrhage, left eye: Secondary | ICD-10-CM | POA: Diagnosis not present

## 2020-09-02 DIAGNOSIS — H401133 Primary open-angle glaucoma, bilateral, severe stage: Secondary | ICD-10-CM | POA: Diagnosis not present

## 2020-09-05 DIAGNOSIS — M25561 Pain in right knee: Secondary | ICD-10-CM | POA: Diagnosis not present

## 2020-09-06 DIAGNOSIS — G4700 Insomnia, unspecified: Secondary | ICD-10-CM | POA: Diagnosis not present

## 2020-09-06 DIAGNOSIS — F331 Major depressive disorder, recurrent, moderate: Secondary | ICD-10-CM | POA: Diagnosis not present

## 2020-09-06 DIAGNOSIS — I10 Essential (primary) hypertension: Secondary | ICD-10-CM | POA: Diagnosis not present

## 2020-09-06 DIAGNOSIS — F411 Generalized anxiety disorder: Secondary | ICD-10-CM | POA: Diagnosis not present

## 2020-09-06 DIAGNOSIS — M1990 Unspecified osteoarthritis, unspecified site: Secondary | ICD-10-CM | POA: Diagnosis not present

## 2020-09-06 DIAGNOSIS — H3581 Retinal edema: Secondary | ICD-10-CM | POA: Diagnosis not present

## 2020-09-12 DIAGNOSIS — M25561 Pain in right knee: Secondary | ICD-10-CM | POA: Diagnosis not present

## 2020-09-15 DIAGNOSIS — M25561 Pain in right knee: Secondary | ICD-10-CM | POA: Diagnosis not present

## 2020-09-19 DIAGNOSIS — M25561 Pain in right knee: Secondary | ICD-10-CM | POA: Diagnosis not present

## 2020-09-23 DIAGNOSIS — M25561 Pain in right knee: Secondary | ICD-10-CM | POA: Diagnosis not present

## 2020-09-26 DIAGNOSIS — M25561 Pain in right knee: Secondary | ICD-10-CM | POA: Diagnosis not present

## 2020-09-29 DIAGNOSIS — M25561 Pain in right knee: Secondary | ICD-10-CM | POA: Diagnosis not present

## 2020-10-10 DIAGNOSIS — M25561 Pain in right knee: Secondary | ICD-10-CM | POA: Diagnosis not present

## 2020-10-11 ENCOUNTER — Ambulatory Visit
Admission: RE | Admit: 2020-10-11 | Discharge: 2020-10-11 | Disposition: A | Discharge status: Home / Self Care | Payer: Medicare Other | Source: Ambulatory Visit | Attending: Family Medicine | Admitting: Family Medicine

## 2020-10-11 ENCOUNTER — Other Ambulatory Visit: Payer: Self-pay

## 2020-10-11 DIAGNOSIS — Z1231 Encounter for screening mammogram for malignant neoplasm of breast: Secondary | ICD-10-CM | POA: Diagnosis not present

## 2020-10-11 DIAGNOSIS — Z780 Asymptomatic menopausal state: Secondary | ICD-10-CM | POA: Diagnosis not present

## 2020-10-11 DIAGNOSIS — E2839 Other primary ovarian failure: Secondary | ICD-10-CM

## 2020-10-11 DIAGNOSIS — M85852 Other specified disorders of bone density and structure, left thigh: Secondary | ICD-10-CM | POA: Diagnosis not present

## 2020-10-13 ENCOUNTER — Other Ambulatory Visit: Payer: Self-pay | Admitting: Family Medicine

## 2020-10-13 DIAGNOSIS — M25561 Pain in right knee: Secondary | ICD-10-CM | POA: Diagnosis not present

## 2020-10-13 DIAGNOSIS — R928 Other abnormal and inconclusive findings on diagnostic imaging of breast: Secondary | ICD-10-CM

## 2020-10-20 DIAGNOSIS — M25561 Pain in right knee: Secondary | ICD-10-CM | POA: Diagnosis not present

## 2020-10-26 DIAGNOSIS — E1165 Type 2 diabetes mellitus with hyperglycemia: Secondary | ICD-10-CM | POA: Diagnosis not present

## 2020-10-26 DIAGNOSIS — E785 Hyperlipidemia, unspecified: Secondary | ICD-10-CM | POA: Diagnosis not present

## 2020-10-26 DIAGNOSIS — I10 Essential (primary) hypertension: Secondary | ICD-10-CM | POA: Diagnosis not present

## 2020-10-26 DIAGNOSIS — E782 Mixed hyperlipidemia: Secondary | ICD-10-CM | POA: Diagnosis not present

## 2020-10-27 ENCOUNTER — Other Ambulatory Visit: Payer: Self-pay

## 2020-10-27 DIAGNOSIS — M25561 Pain in right knee: Secondary | ICD-10-CM | POA: Diagnosis not present

## 2020-10-27 LAB — LIPID PANEL
Chol/HDL Ratio: 2.6 ratio (ref 0.0–4.4)
Cholesterol, Total: 200 mg/dL — ABNORMAL HIGH (ref 100–199)
HDL: 76 mg/dL (ref >39)
LDL Chol Calc (NIH): 115 mg/dL — ABNORMAL HIGH (ref 0–99)
Triglycerides: 46 mg/dL (ref 0–149)
VLDL Cholesterol Cal: 9 mg/dL (ref 5–40)

## 2020-10-27 LAB — HEPATIC FUNCTION PANEL
ALT: 12 IU/L (ref 0–32)
AST: 18 IU/L (ref 0–40)
Albumin: 4.1 g/dL (ref 3.7–4.7)
Alkaline Phosphatase: 94 IU/L (ref 44–121)
Bilirubin Total: 0.4 mg/dL (ref 0.0–1.2)
Bilirubin, Direct: 0.14 mg/dL (ref 0.00–0.40)
Total Protein: 7 g/dL (ref 6.0–8.5)

## 2020-11-01 ENCOUNTER — Other Ambulatory Visit: Payer: Medicare Other

## 2020-11-01 DIAGNOSIS — E1121 Type 2 diabetes mellitus with diabetic nephropathy: Secondary | ICD-10-CM | POA: Diagnosis not present

## 2020-11-01 DIAGNOSIS — I152 Hypertension secondary to endocrine disorders: Secondary | ICD-10-CM | POA: Diagnosis not present

## 2020-11-01 DIAGNOSIS — E11319 Type 2 diabetes mellitus with unspecified diabetic retinopathy without macular edema: Secondary | ICD-10-CM | POA: Diagnosis not present

## 2020-11-01 DIAGNOSIS — K219 Gastro-esophageal reflux disease without esophagitis: Secondary | ICD-10-CM | POA: Diagnosis not present

## 2020-11-01 DIAGNOSIS — M25561 Pain in right knee: Secondary | ICD-10-CM | POA: Diagnosis not present

## 2020-11-01 DIAGNOSIS — E782 Mixed hyperlipidemia: Secondary | ICD-10-CM | POA: Diagnosis not present

## 2020-11-01 DIAGNOSIS — E1169 Type 2 diabetes mellitus with other specified complication: Secondary | ICD-10-CM | POA: Diagnosis not present

## 2020-11-01 DIAGNOSIS — I10 Essential (primary) hypertension: Secondary | ICD-10-CM | POA: Diagnosis not present

## 2020-11-01 DIAGNOSIS — E1165 Type 2 diabetes mellitus with hyperglycemia: Secondary | ICD-10-CM | POA: Diagnosis not present

## 2020-11-03 DIAGNOSIS — M25561 Pain in right knee: Secondary | ICD-10-CM | POA: Diagnosis not present

## 2020-11-04 DIAGNOSIS — M1711 Unilateral primary osteoarthritis, right knee: Secondary | ICD-10-CM | POA: Diagnosis not present

## 2020-11-07 ENCOUNTER — Other Ambulatory Visit: Payer: Self-pay

## 2020-11-07 ENCOUNTER — Ambulatory Visit
Admission: RE | Admit: 2020-11-07 | Discharge: 2020-11-07 | Disposition: A | Discharge status: Home / Self Care | Payer: Medicare Other | Source: Ambulatory Visit | Attending: Family Medicine | Admitting: Family Medicine

## 2020-11-07 ENCOUNTER — Ambulatory Visit: Payer: Medicare Other

## 2020-11-07 DIAGNOSIS — M25561 Pain in right knee: Secondary | ICD-10-CM | POA: Diagnosis not present

## 2020-11-07 DIAGNOSIS — R922 Inconclusive mammogram: Secondary | ICD-10-CM | POA: Diagnosis not present

## 2020-11-07 DIAGNOSIS — R928 Other abnormal and inconclusive findings on diagnostic imaging of breast: Secondary | ICD-10-CM | POA: Diagnosis not present

## 2020-11-09 ENCOUNTER — Other Ambulatory Visit: Payer: Self-pay

## 2020-11-09 ENCOUNTER — Ambulatory Visit (INDEPENDENT_AMBULATORY_CARE_PROVIDER_SITE_OTHER): Payer: Medicare Other | Admitting: Gastroenterology

## 2020-11-09 ENCOUNTER — Encounter: Payer: Self-pay | Admitting: Gastroenterology

## 2020-11-09 VITALS — BP 132/78 | HR 55 | Ht 70.0 in | Wt 215.0 lb

## 2020-11-09 DIAGNOSIS — H43823 Vitreomacular adhesion, bilateral: Secondary | ICD-10-CM | POA: Diagnosis not present

## 2020-11-09 DIAGNOSIS — R053 Chronic cough: Secondary | ICD-10-CM | POA: Diagnosis not present

## 2020-11-09 DIAGNOSIS — K219 Gastro-esophageal reflux disease without esophagitis: Secondary | ICD-10-CM | POA: Diagnosis not present

## 2020-11-09 DIAGNOSIS — H43393 Other vitreous opacities, bilateral: Secondary | ICD-10-CM | POA: Diagnosis not present

## 2020-11-09 DIAGNOSIS — E113212 Type 2 diabetes mellitus with mild nonproliferative diabetic retinopathy with macular edema, left eye: Secondary | ICD-10-CM | POA: Diagnosis not present

## 2020-11-09 DIAGNOSIS — E113291 Type 2 diabetes mellitus with mild nonproliferative diabetic retinopathy without macular edema, right eye: Secondary | ICD-10-CM | POA: Diagnosis not present

## 2020-11-09 MED ORDER — SUCRALFATE 1 G PO TABS: 1.0000 g | ORAL_TABLET | Freq: Three times a day (TID) | ORAL | 2 refills | Status: DC

## 2020-11-09 MED ORDER — FAMOTIDINE 40 MG PO TABS
40.0000 mg | ORAL_TABLET | Freq: Every day | ORAL | 3 refills | Status: DC
Start: 2020-11-09 — End: 2021-02-21

## 2020-11-09 NOTE — Patient Instructions (Addendum)
The BravoT capsule test is a noninvasive test for evaluating heartburn or reflux symptoms related to gastroesophageal reflux disease (GERD). Damage caused by GERD can lead to more serious medical problems such as difficulty swallowing (dysphagia), narrowing of the esophagus (strictures), and Barrett's esophagus.  This test involves inserting a capsule the size of a long gel cap into the esophagus to measure the pH environment. Higher levels of pH in the esophagus indicate the presence of acid reflux. Your doctor will analyze results from the [place] test to determine what is causing your symptoms and which treatment to prescribe for you.  Patients with pacemakers, cardiac defibrillators, or diagnosed gastrointestinal obstructions or strictures should inform your physician.   Throughout the test period, which lasts 48 to 72 hours, the Bravo capsule will measure the pH in your esophagus and transmit this information to the Bravo reflux recorder, a small device that you will wear on your belt or waistband as you would a mobile phone. The capsule communicates with the recorder wirelessly, meaning that no tube or wire remains in your nose or throat.  The Bravo capsule not only measures the degree of acidity during the test period but also how often stomach acid flows into the lower esophagus.  After the capsule has been placed, you can  go about your normal activities. Some patients can feel the presence of the capsule, some do not. You will also be given a diary to note when you have reflux symptoms, when you eat and drink, and when you sleep or lie down.  Once the test has been completed, you will return the recorder and diary to the [place]. The data is then downloaded to a computer program, which provides a comprehensive report. Your GI doctor will analyze the information and your symptoms to determining if you have acid reflux.  A day or two after the test is completed, the disposable  capsule falls off the wall of your esophagus, harmlessly passes through your digestive tract, and is eliminated from your body through a bowel movement.  You should expect to receive the results of the Bravo study in approximately 2 weeks from returning the recorder.    Stay on all your medications for this procedure   You have been scheduled for an endoscopy. Please follow written instructions given to you at your visit today. If you use inhalers (even only as needed), please bring them with you on the day of your procedure.   We will give you Dexilant samples to try one daily  Use Pepcid at bedtime  Take carafate 1 gm before meals and at bedtime    Conn's Current Therapy 2021 (pp. 213-216). [place], [place]: Elsevier.">  Gastroesophageal Reflux Disease, Adult Gastroesophageal reflux (GER) happens when acid from the stomach flows up into the tube that connects the mouth and the stomach (esophagus). Normally, food travels down the esophagus and stays in the stomach to be digested. However, when a person has GER, food and stomach acid sometimes move back up into the esophagus. If this becomes a more serious problem, the person may be diagnosed with a disease called gastroesophageal reflux disease (GERD). GERD occurs when the reflux:  Happens often.  Causes frequent or severe symptoms.  Causes problems such as damage to the esophagus. When stomach acid comes in contact with the esophagus, the acid may cause inflammation in the esophagus. Over time, GERD may create small holes (ulcers) in the lining of the esophagus. What are the causes? This condition is  caused by a problem with the muscle between the esophagus and the stomach (lower esophageal sphincter, or LES). Normally, the LES muscle closes after food passes through the esophagus to the stomach. When the LES is weakened or abnormal, it does not close properly, and that allows food and stomach acid to go back up into the esophagus. The  LES can be weakened by certain dietary substances, medicines, and medical conditions, including:  Tobacco use.  Pregnancy.  Having a hiatal hernia.  Alcohol use.  Certain foods and beverages, such as coffee, chocolate, onions, and peppermint. What increases the risk? You are more likely to develop this condition if you:  Have an increased body weight.  Have a connective tissue disorder.  Take NSAIDs, such as ibuprofen. What are the signs or symptoms? Symptoms of this condition include:  Heartburn.  Difficult or painful swallowing and the feeling of having a lump in the throat.  A bitter taste in the mouth.  Bad breath and having a large amount of saliva.  Having an upset or bloated stomach and belching.  Chest pain. Different conditions can cause chest pain. Make sure you see your health care provider if you experience chest pain.  Shortness of breath or wheezing.  Ongoing (chronic) cough or a nighttime cough.  Wearing away of tooth enamel.  Weight loss. How is this diagnosed? This condition may be diagnosed based on a medical history and a physical exam. To determine if you have mild or severe GERD, your health care provider may also monitor how you respond to treatment. You may also have tests, including:  A test to examine your stomach and esophagus with a small camera (endoscopy).  A test that measures the acidity level in your esophagus.  A test that measures how much pressure is on your esophagus.  A barium swallow or modified barium swallow test to show the shape, size, and functioning of your esophagus. How is this treated? Treatment for this condition may vary depending on how severe your symptoms are. Your health care provider may recommend:  Changes to your diet.  Medicine.  Surgery. The goal of treatment is to help relieve your symptoms and to prevent complications. Follow these instructions at home: Eating and drinking  Follow a diet as  recommended by your health care provider. This may involve avoiding foods and drinks such as: ? Coffee and tea, with or without caffeine. ? Drinks that contain alcohol. ? Energy drinks and sports drinks. ? Carbonated drinks or sodas. ? Chocolate and cocoa. ? Peppermint and mint flavorings. ? Garlic and onions. ? Horseradish. ? Spicy and acidic foods, including peppers, chili powder, curry powder, vinegar, hot sauces, and barbecue sauce. ? Citrus fruit juices and citrus fruits, such as oranges, lemons, and limes. ? Tomato-based foods, such as red sauce, chili, salsa, and pizza with red sauce. ? Fried and fatty foods, such as donuts, french fries, potato chips, and high-fat dressings. ? High-fat meats, such as hot dogs and fatty cuts of red and white meats, such as rib eye steak, sausage, ham, and bacon. ? High-fat dairy items, such as whole milk, butter, and cream cheese.  Eat small, frequent meals instead of large meals.  Avoid drinking large amounts of liquid with your meals.  Avoid eating meals during the 2-3 hours before bedtime.  Avoid lying down right after you eat.  Do not exercise right after you eat.   Lifestyle  Do not use any products that contain nicotine or tobacco. These products  include cigarettes, chewing tobacco, and vaping devices, such as e-cigarettes. If you need help quitting, ask your health care provider.  Try to reduce your stress by using methods such as yoga or meditation. If you need help reducing stress, ask your health care provider.  If you are overweight, reduce your weight to an amount that is healthy for you. Ask your health care provider for guidance about a safe weight loss goal.   General instructions  Pay attention to any changes in your symptoms.  Take over-the-counter and prescription medicines only as told by your health care provider. Do not take aspirin, ibuprofen, or other NSAIDs unless your health care provider told you to take these  medicines.  Wear loose-fitting clothing. Do not wear anything tight around your waist that causes pressure on your abdomen.  Raise (elevate) the head of your bed about 6 inches (15 cm). You can use a wedge to do this.  Avoid bending over if this makes your symptoms worse.  Keep all follow-up visits. This is important. Contact a health care provider if:  You have: ? New symptoms. ? Unexplained weight loss. ? Difficulty swallowing or it hurts to swallow. ? Wheezing or a persistent cough. ? A hoarse voice.  Your symptoms do not improve with treatment. Get help right away if:  You have sudden pain in your arms, neck, jaw, teeth, or back.  You suddenly feel sweaty, dizzy, or light-headed.  You have chest pain or shortness of breath.  You vomit and the vomit is green, yellow, or black, or it looks like blood or coffee grounds.  You faint.  You have stool that is red, bloody, or black.  You cannot swallow, drink, or eat. These symptoms may represent a serious problem that is an emergency. Do not wait to see if the symptoms will go away. Get medical help right away. Call your local emergency services (911 in the [place] Do not drive yourself to the hospital. Summary  Gastroesophageal reflux happens when acid from the stomach flows up into the esophagus. GERD is a disease in which the reflux happens often, causes frequent or severe symptoms, or causes problems such as damage to the esophagus.  Treatment for this condition may vary depending on how severe your symptoms are. Your health care provider may recommend diet and lifestyle changes, medicine, or surgery.  Contact a health care provider if you have new or worsening symptoms.  Take over-the-counter and prescription medicines only as told by your health care provider. Do not take aspirin, ibuprofen, or other NSAIDs unless your health care provider told you to do so.  Keep all follow-up visits as told by your health care  provider. This is important. This information is not intended to replace advice given to you by your health care provider. Make sure you discuss any questions you have with your health care provider. Document Revised: 04/11/2020 Document Reviewed: 04/11/2020 Elsevier Patient Education  [place].  I appreciate the  opportunity to care for you  Thank You   Harl Bowie , MD

## 2020-11-09 NOTE — Progress Notes (Signed)
[name]    VN:[id number]    [birth year]  Primary Care Physician:[name], Mechele Claude, MD  Referring Physician: Fanny Bien, MD [address] [place],  [place] [address]   Chief complaint: GERD, chronic cough HPI:   80 year old very pleasant female with h/o hiatal hernia and chronic GERD status post Nissen fundoplication [id number] and revision in 2009 with recurrent hiatal hernia, here for follow-up visit for persistent reflux symptoms and chronic cough. She is accompanied by her daughter for this visit  Patient reports her cough is worse as the day goes by especially at bedtime and also she has multiple episodes of cough during the daytime. She had evaluation by pulmonary and is being managed by them for chronic cough.  She also has sleep apnea, uses CPAP  She is taking omeprazole daily and Pepcid at bedtime with almost daily breakthrough heartburn and sore throat.  She has to take multiple Tums at bedtime She was prescribed Dexilant by Dr. Ernie Hew, was not covered by her insurance.  Colonoscopy September 30, 2018: 5 mm [tubular adenoma] polyp removed from transverse colon, diverticulosis and hemorrhoids.  No recall due to age  GI Hx: She has had multiple upper GI series which show moderate size hiatal hernia and evidence of reflux.  Esophageal manometry showed normal esophageal motility with good bolus clearance. Per patient she had a pH study done prior to her first fundoplication. It is not clear why patient had a revision in 2009, surgical report not available in epic but per patient she had a lot of scar tissue and had difficulty with the revision and she couldn't have the complete Nissen fundoplication at the time.   Outpatient Encounter Medications as of 11/09/2020  Medication Sig  . cetirizine (ZYRTEC) 10 MG tablet Take 10 mg by mouth at bedtime.  . famotidine (PEPCID) 40 MG tablet Take 40 mg by mouth daily.  [name] FLUoxetine (PROZAC) 40 MG capsule Take 40 mg by  mouth daily.  [name] losartan (COZAAR) 50 MG tablet Take 50 mg by mouth daily.   . montelukast (SINGULAIR) 10 MG tablet Take 10 mg by mouth at bedtime.  [name] nystatin (MYCOSTATIN/NYSTOP) powder Apply 1 g topically daily as needed (for rash).   . nystatin ointment (MYCOSTATIN) Apply 1 application topically 2 (two) times daily as needed (for rash).   . ondansetron (ZOFRAN) 4 MG tablet Take 1 tablet (4 mg total) by mouth every 8 (eight) hours as needed for nausea or vomiting.  . rosuvastatin (CRESTOR) 40 MG tablet Take 40 mg by mouth daily.   . traMADol (ULTRAM) 50 MG tablet Take 50 mg by mouth 3 (three) times daily as needed.  . brimonidine (ALPHAGAN P) 0.1 % SOLN Place 1 drop into both eyes in the morning, at noon, and at bedtime. (Patient not taking: Reported on 11/09/2020)  . methocarbamol (ROBAXIN) 500 MG tablet Take 1 tablet by mouth 3 (three) times daily as needed for muscle spasms. (Patient not taking: Reported on 11/09/2020)  . omeprazole (PRILOSEC) 40 MG capsule Take 40 mg by mouth daily. (Patient not taking: Reported on 11/09/2020)   No facility-administered encounter medications on file as of 11/09/2020.    Allergies as of 11/09/2020 - Review Complete 08/19/2020  Allergen Reaction Noted  . Adalat [nifedipine] Other (See Comments) 09/17/2013  . Hydrocodone Itching and Nausea And Vomiting 09/18/2013  . Meperidine hcl Other (See Comments)   . Naproxen Nausea And Vomiting  Past Medical History:  Diagnosis Date  . Anal or rectal pain    sometimes  . Anemia    hx of during pregnancy  . Arthritis   . Asthma    hx of  . Bradycardia    " I KNOW I HAVE BRADYCARDIA ESPECIALLY WHEN I SLEEP"   . Chronic back pain   . Degenerative joint disease    osteo  . Depression   . Diverticulosis 2003  . Dysrhythmia    hx of  due to eye drop and also low heart rate 40's per pt. Dr. Gwenlyn Found follows  . Elevated total protein   . Esophageal dysmotility   . Fibromyalgia   . GERD (gastroesophageal reflux  disease)    subsequent Nissen Fundoplication  . Glaucoma   . Hearing loss   . Hemorrhoids   . Hiatal hernia 11/08/09  . Hx of adenomatous colonic polyps 07/02/02  . Hypercalcemia   . Hyperlipidemia   . Hyperlipidemia   . Hypertension   . Nausea   . Osteoporosis   . PONV (postoperative nausea and vomiting)   . Rectal bleeding    from hemorrhoids.    . Sleep apnea    DOES USE CPAP   . Thrombocytopenia ([place])   . Varicose veins of left lower extremity     Past Surgical History:  Procedure Laterality Date  . CARDIAC CATHETERIZATION  03/14/1992   Normal cardiac cath. Normal LV function.  [name] CARDIOVASCULAR STRESS TEST  01/22/2011   No scintigraphic evidence of inducible ischemia.  [name] CAROTID DOPPLER  03/31/2007   Bilateral ICAs - no evidence of significant diameter reduction, dissectin, tortuosity, FMD, or any other vascular abnormality.  [name] CATARACT EXTRACTION, BILATERAL    . ESOPHAGEAL MANOMETRY N/A 11/14/2015   Procedure: ESOPHAGEAL MANOMETRY (EM);  Surgeon: Mauri Pole, MD;  Location: [facility];  Service: Endoscopy;  Laterality: N/A;  . GASTRIC RESECTION  2009  . GLAUCOMA SURGERY    . JOINT REPLACEMENT     right knee Dr. Wynelle Link 06-23-18  . KNEE SURGERY Bilateral   . NISSEN FUNDOPLICATION  4315   with subsequent takedown in 2009  . TOTAL KNEE ARTHROPLASTY Left 06/10/2017   Procedure: LEFT  TOTAL KNEE ARTHROPLASTY;  Surgeon: Gaynelle Arabian, MD;  Location: [facility];  Service: Orthopedics;  Laterality: Left;  Adductor Block  . TOTAL KNEE ARTHROPLASTY Right 06/23/2018   Procedure: RIGHT TOTAL KNEE ARTHROPLASTY;  Surgeon: Gaynelle Arabian, MD;  Location: [facility];  Service: Orthopedics;  Laterality: Right;  . TRANSTHORACIC ECHOCARDIOGRAM  12/21/2010   EF 60%, moderate LVH,     Family History  Problem Relation Age of Onset  . Heart disease Mother   . Hypertension Mother   . Diabetes Mother   . Diabetes Father   . Kidney disease Father   . Breast cancer Daughter   . Stomach cancer  Maternal Aunt   . Leukemia Maternal Uncle   . Prostate cancer Maternal Uncle   . Stroke Brother   . Cancer Maternal Grandmother   . Kidney disease Maternal Grandfather   . Uterine cancer Maternal Aunt   . Colon cancer Neg Hx     Social History   Socioeconomic History  . Marital status: Married    Spouse name: Not on file  . Number of children: 8  . Years of education: Not on file  . Highest education level: Not on file  Occupational History  . Occupation: retired    Comment: retired Therapist, art.   Tobacco  Use  . Smoking status: Never Smoker  . Smokeless tobacco: Never Used  Vaping Use  . Vaping Use: Never used  Substance and Sexual Activity  . Alcohol use: No    Alcohol/week: 0.0 standard drinks  . Drug use: No  . Sexual activity: Yes  Other Topics Concern  . Not on file  Social History Narrative   Husband, Antonio Woodhams is Next of Kin. Cell # [phone]   Social Determinants of Health   Financial Resource Strain: Not on file  Food Insecurity: Not on file  Transportation Needs: Not on file  Physical Activity: Not on file  Stress: Not on file  Social Connections: Not on file  Intimate Partner Violence: Not on file      Review of systems: All other review of systems negative except as mentioned in the HPI.   Physical Exam: Vitals:   11/09/20 1103  BP: 132/78  Pulse: (!) 55  SpO2: 97%   Body mass index is 30.85 kg/m. Gen:      No acute distress Neuro: alert and oriented x 3 Psych: normal mood and affect  Data Reviewed:  Reviewed labs, radiology imaging, old records and pertinent past GI work up   Assessment and Plan/Recommendations:  80 year old very pleasant female with history of chronic GERD s/p Nissen fundoplication, redo with recurrent hiatal hernia and GERD symptoms  Given persistent symptoms despite PPI use, will proceed with EGD for further evaluation to exclude erosive esophagitis.  We will also evaluate for any mucosal changes or  Barrett's esophagus Last EGD in 2012 If EGD unremarkable, will plan to place 48-hour pH Bravo sensor to determine the extent of esophageal acid exposure and what role acid reflux is playing in terms of chronic cough.  Advised patient to continue PPI and H2 blocker Patient is unlikely to tolerate 24-hour pH impedance catheter pH study due to chronic cough Provided samples for Dexilant 60 mg daily, advised patient to hold omeprazole while taking Dexilant. Use Pepcid at bedtime as needed Carafate before meals and at bedtime as needed Continue with antireflux measures  The risks and benefits as well as alternatives of endoscopic procedure(s) have been discussed and reviewed. All questions answered. The patient agrees to proceed.   This visit required 40 minutes of patient care (this includes precharting, chart review, review of results, face-to-face time used for counseling as well as treatment plan and follow-up. The patient was provided an opportunity to ask questions and all were answered. The patient agreed with the plan and demonstrated an understanding of the instructions.  Damaris Hippo , MD    CC: Fanny Bien, MD

## 2020-11-15 DIAGNOSIS — E113212 Type 2 diabetes mellitus with mild nonproliferative diabetic retinopathy with macular edema, left eye: Secondary | ICD-10-CM | POA: Diagnosis not present

## 2020-11-18 DIAGNOSIS — M25561 Pain in right knee: Secondary | ICD-10-CM | POA: Diagnosis not present

## 2020-11-22 DIAGNOSIS — M25561 Pain in right knee: Secondary | ICD-10-CM | POA: Diagnosis not present

## 2020-11-24 DIAGNOSIS — M25561 Pain in right knee: Secondary | ICD-10-CM | POA: Diagnosis not present

## 2020-12-01 DIAGNOSIS — M25561 Pain in right knee: Secondary | ICD-10-CM | POA: Diagnosis not present

## 2020-12-06 DIAGNOSIS — M25561 Pain in right knee: Secondary | ICD-10-CM | POA: Diagnosis not present

## 2020-12-08 DIAGNOSIS — M25561 Pain in right knee: Secondary | ICD-10-CM | POA: Diagnosis not present

## 2020-12-09 DIAGNOSIS — M25561 Pain in right knee: Secondary | ICD-10-CM | POA: Diagnosis not present

## 2020-12-12 DIAGNOSIS — M25561 Pain in right knee: Secondary | ICD-10-CM | POA: Diagnosis not present

## 2020-12-13 DIAGNOSIS — H43823 Vitreomacular adhesion, bilateral: Secondary | ICD-10-CM | POA: Diagnosis not present

## 2020-12-13 DIAGNOSIS — H43393 Other vitreous opacities, bilateral: Secondary | ICD-10-CM | POA: Diagnosis not present

## 2020-12-13 DIAGNOSIS — E113291 Type 2 diabetes mellitus with mild nonproliferative diabetic retinopathy without macular edema, right eye: Secondary | ICD-10-CM | POA: Diagnosis not present

## 2020-12-13 DIAGNOSIS — E113212 Type 2 diabetes mellitus with mild nonproliferative diabetic retinopathy with macular edema, left eye: Secondary | ICD-10-CM | POA: Diagnosis not present

## 2020-12-19 DIAGNOSIS — M25561 Pain in right knee: Secondary | ICD-10-CM | POA: Diagnosis not present

## 2020-12-23 DIAGNOSIS — J019 Acute sinusitis, unspecified: Secondary | ICD-10-CM | POA: Diagnosis not present

## 2020-12-27 DIAGNOSIS — M25561 Pain in right knee: Secondary | ICD-10-CM | POA: Diagnosis not present

## 2020-12-30 DIAGNOSIS — M25561 Pain in right knee: Secondary | ICD-10-CM | POA: Diagnosis not present

## 2021-01-03 DIAGNOSIS — M25561 Pain in right knee: Secondary | ICD-10-CM | POA: Diagnosis not present

## 2021-01-05 DIAGNOSIS — M25561 Pain in right knee: Secondary | ICD-10-CM | POA: Diagnosis not present

## 2021-01-09 ENCOUNTER — Telehealth: Payer: Self-pay | Admitting: Cardiovascular Disease

## 2021-01-09 DIAGNOSIS — M25561 Pain in right knee: Secondary | ICD-10-CM | POA: Diagnosis not present

## 2021-01-09 NOTE — Telephone Encounter (Signed)
Pt c/o Shortness Of Breath: STAT if SOB developed within the last 24 hours or pt is noticeably SOB on the phone  1. Are you currently SOB (can you hear that pt is SOB on the phone)? No   2. How long have you been experiencing SOB? About 3 weeks   3. Are you SOB when sitting or when up moving around? When up and moving around  4. Are you currently experiencing any other symptoms?  Chest pain  Pt c/o of Chest Pain: STAT if CP now or developed within 24 hours  1. Are you having CP right now? No   2. Are you experiencing any other symptoms (ex. SOB, nausea, vomiting, sweating)? No   3. How long have you been experiencing CP? Past few months, per patient  4. Is your CP continuous or coming and going? Coming and going   5. Have you taken Nitroglycerin? No  ?

## 2021-01-09 NOTE — Telephone Encounter (Signed)
Spoke with the patient who states that she has been having increased shortness of breath for the past few weeks. She states that SOB occurs with exertion but has also occurred a couple of times when she was in bed. She states that she felt like her heart wasn't pumping right but denies any palpitations. She states she has been more fatigued and tired recently. She states that she does have some swelling in her legs that happens when she is on her feet for extended periods. She is able to elevate them with some relief. She states that she thinks that she has gained some weight but is not sure how much because she does not weigh daily. She denies any current chest pain but reports that she does have chest pain at times, however she has GERD and is followed by GI. She is having an endoscopy tomorrow for evaluation.  She does report that her blood pressure has been running high recently. Her last reading was 179/88. Patient took blood pressure while we were on the phone and it was 195/86. She states that she has not taken any of her medications this morning. I advised her to go ahead and take her losartan. Advised to recheck her BP in a couple hours and let us know if it is still elevated. Will send to Dr. Gwenlyn Found for further advisement.

## 2021-01-09 NOTE — Telephone Encounter (Signed)
[name] should probably be seen by an APP in the next 1-2 weeks.  JJB

## 2021-01-10 ENCOUNTER — Ambulatory Visit (AMBULATORY_SURGERY_CENTER): Payer: Medicare Other | Admitting: Gastroenterology

## 2021-01-10 ENCOUNTER — Encounter: Payer: Self-pay | Admitting: Gastroenterology

## 2021-01-10 ENCOUNTER — Other Ambulatory Visit: Payer: Self-pay

## 2021-01-10 VITALS — BP 145/65 | HR 70 | Temp 97.5°F | Resp 14 | Ht 70.0 in | Wt 215.0 lb

## 2021-01-10 DIAGNOSIS — K259 Gastric ulcer, unspecified as acute or chronic, without hemorrhage or perforation: Secondary | ICD-10-CM

## 2021-01-10 DIAGNOSIS — K449 Diaphragmatic hernia without obstruction or gangrene: Secondary | ICD-10-CM

## 2021-01-10 DIAGNOSIS — K2950 Unspecified chronic gastritis without bleeding: Secondary | ICD-10-CM | POA: Diagnosis not present

## 2021-01-10 DIAGNOSIS — R12 Heartburn: Secondary | ICD-10-CM

## 2021-01-10 DIAGNOSIS — K2970 Gastritis, unspecified, without bleeding: Secondary | ICD-10-CM

## 2021-01-10 DIAGNOSIS — K219 Gastro-esophageal reflux disease without esophagitis: Secondary | ICD-10-CM

## 2021-01-10 MED ORDER — SODIUM CHLORIDE 0.9 % IV SOLN: 500.0000 mL | Freq: Once | INTRAVENOUS | Status: DC

## 2021-01-10 NOTE — Progress Notes (Signed)
Called to room to assist during endoscopic procedure.  Patient ID and intended procedure confirmed with present staff. Received instructions for my participation in the procedure from the performing physician.  

## 2021-01-10 NOTE — Op Note (Signed)
[facility] Patient Name: [name] Procedure Date: 01/10/2021 9:05 AM MRN: [id number] Endoscopist: Mauri Pole , MD Age: 80 Referring MD:  Date of Birth: [birth year] Gender: Female Account #: [id number] Procedure:                Upper GI endoscopy Indications:              Heartburn, Esophageal reflux symptoms that persist                            despite appropriate therapy Medicines:                Monitored Anesthesia Care Procedure:                Pre-Anesthesia Assessment:                           - Prior to the procedure, a History and Physical                            was performed, and patient medications and                            allergies were reviewed. The patient's tolerance of                            previous anesthesia was also reviewed. The risks                            and benefits of the procedure and the sedation                            options and risks were discussed with the patient.                            All questions were answered, and informed consent                            was obtained. Prior Anticoagulants: The patient has                            taken no previous anticoagulant or antiplatelet                            agents. ASA Grade Assessment: III - A patient with                            severe systemic disease. After reviewing the risks                            and benefits, the patient was deemed in                            satisfactory condition to undergo the procedure.  After obtaining informed consent, the endoscope was                            passed under direct vision. Throughout the                            procedure, the patient's blood pressure, pulse, and                            oxygen saturations were monitored continuously. The                            Endoscope was introduced through the mouth, and                            advanced to the second  part of duodenum. The upper                            GI endoscopy was accomplished without difficulty.                            The patient tolerated the procedure well. Scope In: Scope Out: Findings:                 The lumen of the lower third of the esophagus was                            mildly dilated.                           Two cratered and linear esophageal ulcers were                            found 34 to 36 cm from the incisors. The largest                            lesion was 8 mm in largest dimension. Biopsies were                            taken with a cold forceps for histology.                           A 5 cm hiatal hernia was present.                           Few cratered, linear and superficial gastric ulcers                            were found in the cardia. The largest lesion was 10                            mm in largest dimension. Biopsies were taken with a  cold forceps for histology.                           Evidence of a Nissen fundoplication was found in                            the cardia. The wrap appeared not intact. This was                            traversed.                           The examined duodenum was normal. Complications:            No immediate complications. Estimated Blood Loss:     Estimated blood loss was minimal. Impression:               - Dilation in the lower third of the esophagus.                           - Esophageal ulcers. Biopsied.                           - 5 cm hiatal hernia.                           - Gastric ulcers. Biopsied.                           - A Nissen fundoplication was found. The wrap                            appears not intact.                           - Normal examined duodenum. Recommendation:           - Resume previous diet.                           - Continue present medications.                           - Await pathology results.                            - Follow an antireflux regimen.                           - Use Dexilant (dexlansoprazole) 60 mg PO daily.                           - Use Pepcid (famotidine) 20 mg PO daily at bedtime.                           - Use sucralfate suspension 1 gram PO QID PRN  before meals and bedtime.                           - Schedule esophageal manometry next available                            appointment to exclude major esophageal dysmotility                            prior to hernia repair and fundoplication                           - Refer to Dr Kipp Brood for hiatal hernia repair                            and redo of fundoplication Mauri Pole, MD 01/10/2021 9:32:40 AM This report has been signed electronically.

## 2021-01-10 NOTE — Progress Notes (Signed)
PT taken to PACU. Monitors in place. VSS. Report given to RN. 

## 2021-01-10 NOTE — Patient Instructions (Signed)
Handout given:  Esophagitis, hiatal hernia Resume previous diet Continue current medications Await pathology results Follow an antireflux regimen  YOU HAD AN ENDOSCOPIC PROCEDURE TODAY AT [facility]:   Refer to the procedure report that was given to you for any specific questions about what was found during the examination.  If the procedure report does not answer your questions, please call your gastroenterologist to clarify.  If you requested that your care partner not be given the details of your procedure findings, then the procedure report has been included in a sealed envelope for you to review at your convenience later.  YOU SHOULD EXPECT: Some feelings of bloating in the abdomen. Passage of more gas than usual.  Walking can help get rid of the air that was put into your GI tract during the procedure and reduce the bloating. If you had a lower endoscopy (such as a colonoscopy or flexible sigmoidoscopy) you may notice spotting of blood in your stool or on the toilet paper. If you underwent a bowel prep for your procedure, you may not have a normal bowel movement for a few days.  Please Note:  You might notice some irritation and congestion in your nose or some drainage.  This is from the oxygen used during your procedure.  There is no need for concern and it should clear up in a day or so.  SYMPTOMS TO REPORT IMMEDIATELY:   Following upper endoscopy (EGD)  Vomiting of blood or coffee ground material  New chest pain or pain under the shoulder blades  Painful or persistently difficult swallowing  New shortness of breath  Fever of 100F or higher  Black, tarry-looking stools  For urgent or emergent issues, a gastroenterologist can be reached at any hour by calling [phone]. Do not use MyChart messaging for urgent concerns.   DIET:  We do recommend a small meal at first, but then you may proceed to your regular diet.  Drink plenty of fluids but you should avoid  alcoholic beverages for 24 hours.  ACTIVITY:  You should plan to take it easy for the rest of today and you should NOT DRIVE or use heavy machinery until tomorrow (because of the sedation medicines used during the test).    FOLLOW UP: Our staff will call the number listed on your records 48-72 hours following your procedure to check on you and address any questions or concerns that you may have regarding the information given to you following your procedure. If we do not reach you, we will leave a message.  We will attempt to reach you two times.  During this call, we will ask if you have developed any symptoms of COVID 19. If you develop any symptoms (ie: fever, flu-like symptoms, shortness of breath, cough etc.) before then, please call [phone].  If you test positive for Covid 19 in the 2 weeks post procedure, please call and report this information to [place].    If any biopsies were taken you will be contacted by phone or by letter within the next 1-3 weeks.  Please call us at [phone] if you have not heard about the biopsies in 3 weeks.   SIGNATURES/CONFIDENTIALITY: You and/or your care partner have signed paperwork which will be entered into your electronic medical record.  These signatures attest to the fact that that the information above on your After Visit Summary has been reviewed and is understood.  Full responsibility of the confidentiality of this discharge information lies with  you and/or your care-partner.

## 2021-01-10 NOTE — Progress Notes (Signed)
VS taken by C.W. 

## 2021-01-11 ENCOUNTER — Telehealth: Payer: Self-pay

## 2021-01-11 ENCOUNTER — Other Ambulatory Visit: Payer: Self-pay

## 2021-01-11 DIAGNOSIS — K219 Gastro-esophageal reflux disease without esophagitis: Secondary | ICD-10-CM

## 2021-01-11 NOTE — Telephone Encounter (Signed)
Scheduled patient for Esophageal Manometry on 01/18/21, patient to arrive at 10:00am and be NPO after midnight and Hold am medications. COVID testing scheduled on 01/13/21 at 10:15am @ [address]  [place]. Patient does not use MyChart, reviewed Instructions on the phone with patient (not enough time for mailing)

## 2021-01-11 NOTE — Telephone Encounter (Signed)
Patient called to reschedule her Covid test appointment

## 2021-01-11 NOTE — Telephone Encounter (Signed)
Referral made to Dr. Kipp Brood, H. With [facility] for Hiatal Hernia repair and redo of Fundoplication

## 2021-01-11 NOTE — Telephone Encounter (Signed)
Called patient back and changed COVID testing time

## 2021-01-12 ENCOUNTER — Telehealth: Payer: Self-pay

## 2021-01-12 NOTE — Telephone Encounter (Signed)
  Follow up Call-  Call back number 01/10/2021 09/30/2018  Post procedure Call Back phone  # [phone]  Permission to leave phone message Yes Yes  Some recent data might be hidden     Patient questions:  Do you have a fever, pain , or abdominal swelling? No. Pain Score  0 *  Have you tolerated food without any problems? Yes.    Have you been able to return to your normal activities? Yes.    Do you have any questions about your discharge instructions: Diet   No. Medications  No. Follow up visit  No.  Do you have questions or concerns about your Care? No.  Actions: * If pain score is 4 or above: No action needed, pain <4.  1. Have you developed a fever since your procedure? no  2.   Have you had an respiratory symptoms (SOB or cough) since your procedure? no  3.   Have you tested positive for COVID 19 since your procedure no  4.   Have you had any family members/close contacts diagnosed with the COVID 19 since your procedure?  no   If yes to any of these questions please route to Joylene John, RN and Joella Prince, RN

## 2021-01-13 ENCOUNTER — Other Ambulatory Visit (HOSPITAL_COMMUNITY)
Admission: RE | Admit: 2021-01-13 | Discharge: 2021-01-13 | Disposition: A | Discharge status: Home / Self Care | Payer: Medicare Other | Source: Ambulatory Visit | Attending: Gastroenterology | Admitting: Gastroenterology

## 2021-01-13 ENCOUNTER — Other Ambulatory Visit (HOSPITAL_COMMUNITY): Payer: Medicare Other

## 2021-01-13 DIAGNOSIS — Z20822 Contact with and (suspected) exposure to covid-19: Secondary | ICD-10-CM | POA: Insufficient documentation

## 2021-01-13 DIAGNOSIS — H43393 Other vitreous opacities, bilateral: Secondary | ICD-10-CM | POA: Diagnosis not present

## 2021-01-13 DIAGNOSIS — E113212 Type 2 diabetes mellitus with mild nonproliferative diabetic retinopathy with macular edema, left eye: Secondary | ICD-10-CM | POA: Diagnosis not present

## 2021-01-13 DIAGNOSIS — H43823 Vitreomacular adhesion, bilateral: Secondary | ICD-10-CM | POA: Diagnosis not present

## 2021-01-13 DIAGNOSIS — Z01812 Encounter for preprocedural laboratory examination: Secondary | ICD-10-CM | POA: Insufficient documentation

## 2021-01-13 DIAGNOSIS — E113291 Type 2 diabetes mellitus with mild nonproliferative diabetic retinopathy without macular edema, right eye: Secondary | ICD-10-CM | POA: Diagnosis not present

## 2021-01-13 LAB — SARS CORONAVIRUS 2 (TAT 6-24 HRS): SARS Coronavirus 2: NEGATIVE

## 2021-01-16 DIAGNOSIS — M25561 Pain in right knee: Secondary | ICD-10-CM | POA: Diagnosis not present

## 2021-01-17 NOTE — Telephone Encounter (Signed)
Spoke with pt on the phone to check in and to possibly set up APP appointment that has came available.  Pt states that she no longer needs to be seen. Pt states that she had and EGD done recently and has found out that the pain she was experiencing was stomach ulcers and not chest pain. Pt is comfortable waiting until her scheduled appointment with Dr. Gwenlyn Found.

## 2021-01-18 ENCOUNTER — Encounter (HOSPITAL_COMMUNITY)
Admission: RE | Disposition: A | Discharge status: Home / Self Care | Payer: Self-pay | Source: Home / Self Care | Attending: Gastroenterology

## 2021-01-18 ENCOUNTER — Ambulatory Visit (HOSPITAL_COMMUNITY)
Admission: RE | Admit: 2021-01-18 | Discharge: 2021-01-18 | Disposition: A | Discharge status: Home / Self Care | Payer: Medicare Other | Attending: Gastroenterology | Admitting: Gastroenterology

## 2021-01-18 DIAGNOSIS — K219 Gastro-esophageal reflux disease without esophagitis: Secondary | ICD-10-CM | POA: Insufficient documentation

## 2021-01-18 DIAGNOSIS — R12 Heartburn: Secondary | ICD-10-CM | POA: Diagnosis not present

## 2021-01-18 DIAGNOSIS — K2289 Other specified disease of esophagus: Secondary | ICD-10-CM | POA: Diagnosis not present

## 2021-01-18 DIAGNOSIS — K224 Dyskinesia of esophagus: Secondary | ICD-10-CM | POA: Diagnosis not present

## 2021-01-18 DIAGNOSIS — K449 Diaphragmatic hernia without obstruction or gangrene: Secondary | ICD-10-CM | POA: Diagnosis not present

## 2021-01-18 HISTORY — PX: ESOPHAGEAL MANOMETRY: SHX5429

## 2021-01-18 SURGERY — MANOMETRY, ESOPHAGUS
Anesthesia: Choice

## 2021-01-18 MED ORDER — LIDOCAINE VISCOUS HCL 2 % MT SOLN
OROMUCOSAL | Status: AC
  Filled 2021-01-18: qty 15

## 2021-01-18 SURGICAL SUPPLY — 2 items
FACESHIELD LNG OPTICON STERILE (SAFETY) IMPLANT
GLOVE BIO SURGEON STRL SZ8 (GLOVE) ×4 IMPLANT

## 2021-01-18 NOTE — Progress Notes (Signed)
Esophageal manometry performed per protocol.  Patient tolerated well.  Report to be sent to Dr. Kavitha Nandigam 

## 2021-01-19 ENCOUNTER — Encounter (HOSPITAL_COMMUNITY): Payer: Self-pay | Admitting: Gastroenterology

## 2021-01-19 DIAGNOSIS — M25561 Pain in right knee: Secondary | ICD-10-CM | POA: Diagnosis not present

## 2021-01-20 ENCOUNTER — Encounter: Payer: Self-pay | Admitting: Gastroenterology

## 2021-01-24 ENCOUNTER — Telehealth: Payer: Self-pay | Admitting: Gastroenterology

## 2021-01-24 DIAGNOSIS — M25561 Pain in right knee: Secondary | ICD-10-CM | POA: Diagnosis not present

## 2021-01-24 NOTE — Telephone Encounter (Signed)
Called the patient back. Discussed the pathology report. Explained the esophageal manometry report will not be available for 1 to 2 more weeks.  Encouraged this very nice patient to set up her My Chart account so she may see her results as well.

## 2021-02-07 DIAGNOSIS — E782 Mixed hyperlipidemia: Secondary | ICD-10-CM | POA: Diagnosis not present

## 2021-02-07 DIAGNOSIS — E559 Vitamin D deficiency, unspecified: Secondary | ICD-10-CM | POA: Diagnosis not present

## 2021-02-07 DIAGNOSIS — E1165 Type 2 diabetes mellitus with hyperglycemia: Secondary | ICD-10-CM | POA: Diagnosis not present

## 2021-02-07 DIAGNOSIS — I10 Essential (primary) hypertension: Secondary | ICD-10-CM | POA: Diagnosis not present

## 2021-02-07 DIAGNOSIS — K219 Gastro-esophageal reflux disease without esophagitis: Secondary | ICD-10-CM | POA: Diagnosis not present

## 2021-02-07 DIAGNOSIS — R739 Hyperglycemia, unspecified: Secondary | ICD-10-CM | POA: Diagnosis not present

## 2021-02-09 DIAGNOSIS — Z7185 Encounter for immunization safety counseling: Secondary | ICD-10-CM | POA: Diagnosis not present

## 2021-02-09 DIAGNOSIS — K219 Gastro-esophageal reflux disease without esophagitis: Secondary | ICD-10-CM | POA: Diagnosis not present

## 2021-02-09 DIAGNOSIS — I152 Hypertension secondary to endocrine disorders: Secondary | ICD-10-CM | POA: Diagnosis not present

## 2021-02-09 DIAGNOSIS — K449 Diaphragmatic hernia without obstruction or gangrene: Secondary | ICD-10-CM | POA: Diagnosis not present

## 2021-02-09 DIAGNOSIS — Z23 Encounter for immunization: Secondary | ICD-10-CM | POA: Diagnosis not present

## 2021-02-09 DIAGNOSIS — E1169 Type 2 diabetes mellitus with other specified complication: Secondary | ICD-10-CM | POA: Diagnosis not present

## 2021-02-09 DIAGNOSIS — I10 Essential (primary) hypertension: Secondary | ICD-10-CM | POA: Diagnosis not present

## 2021-02-09 DIAGNOSIS — E1159 Type 2 diabetes mellitus with other circulatory complications: Secondary | ICD-10-CM | POA: Diagnosis not present

## 2021-02-09 DIAGNOSIS — F411 Generalized anxiety disorder: Secondary | ICD-10-CM | POA: Diagnosis not present

## 2021-02-09 DIAGNOSIS — E1165 Type 2 diabetes mellitus with hyperglycemia: Secondary | ICD-10-CM | POA: Diagnosis not present

## 2021-02-09 DIAGNOSIS — E782 Mixed hyperlipidemia: Secondary | ICD-10-CM | POA: Diagnosis not present

## 2021-02-10 ENCOUNTER — Encounter: Payer: Medicare Other | Admitting: Thoracic Surgery (Cardiothoracic Vascular Surgery)

## 2021-02-10 DIAGNOSIS — E113291 Type 2 diabetes mellitus with mild nonproliferative diabetic retinopathy without macular edema, right eye: Secondary | ICD-10-CM | POA: Diagnosis not present

## 2021-02-10 DIAGNOSIS — E113212 Type 2 diabetes mellitus with mild nonproliferative diabetic retinopathy with macular edema, left eye: Secondary | ICD-10-CM | POA: Diagnosis not present

## 2021-02-10 DIAGNOSIS — H43393 Other vitreous opacities, bilateral: Secondary | ICD-10-CM | POA: Diagnosis not present

## 2021-02-17 ENCOUNTER — Encounter: Payer: Medicare Other | Admitting: Thoracic Surgery (Cardiothoracic Vascular Surgery)

## 2021-02-21 ENCOUNTER — Other Ambulatory Visit: Payer: Self-pay

## 2021-02-21 ENCOUNTER — Encounter: Payer: Self-pay | Admitting: Cardiovascular Disease

## 2021-02-21 ENCOUNTER — Other Ambulatory Visit: Payer: Self-pay | Admitting: Gastroenterology

## 2021-02-21 ENCOUNTER — Ambulatory Visit (INDEPENDENT_AMBULATORY_CARE_PROVIDER_SITE_OTHER): Payer: Medicare Other | Admitting: Cardiovascular Disease

## 2021-02-21 VITALS — BP 128/80 | HR 53 | Ht 70.0 in | Wt 212.8 lb

## 2021-02-21 DIAGNOSIS — E782 Mixed hyperlipidemia: Secondary | ICD-10-CM | POA: Diagnosis not present

## 2021-02-21 DIAGNOSIS — R001 Bradycardia, unspecified: Secondary | ICD-10-CM | POA: Diagnosis not present

## 2021-02-21 DIAGNOSIS — K224 Dyskinesia of esophagus: Secondary | ICD-10-CM

## 2021-02-21 DIAGNOSIS — I10 Essential (primary) hypertension: Secondary | ICD-10-CM | POA: Diagnosis not present

## 2021-02-21 DIAGNOSIS — R0789 Other chest pain: Secondary | ICD-10-CM | POA: Diagnosis not present

## 2021-02-21 DIAGNOSIS — R12 Heartburn: Secondary | ICD-10-CM

## 2021-02-21 NOTE — Assessment & Plan Note (Signed)
History of atypical chest pain with negative Myoview stress test in the past.  I am going to get a coronary calcium score.  I suspect this is related to reflux.

## 2021-02-21 NOTE — Assessment & Plan Note (Signed)
History of essential hypertension blood pressure measured today at 128/80.  She is on losartan.

## 2021-02-21 NOTE — Progress Notes (Signed)
02/21/2021 [name]   06-30-1941  [id number]  Primary Physician Fanny Bien, MD Primary Cardiologist: Lorretta Harp MD FACP, [place], [place], [place]  HPI:  [name] is a 80 y.o.  mildly overweight, married [place] American female, mother of 70, grandmother to 58 grandchildren, whom I last saw 08/19/2020. She is accompanied by her daughter [name] today.  She is referred back today for preoperative clearance before elective right total knee replacement scheduled to be performed by Dr.Alusioon September 9. 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 She has a history of hyperlipidemia. She was admitted to [facility] on December 20, 2010, with presyncope. Her symptoms were heralded by an episode of nausea. Her other history is remarkable for degenerative joint disease, depression, and fibromyalgia as well as esophageal dysmotility, status post Nissen fundoplication in the past. A 2D echocardiogram was normal. She was complaining of some chest pain at that time, and a Myoview stress test performed in our office on January 22, 2011, was normal as well.An event monitor was performed That showed heart rates in the 40s especially while she was asleep.Marland KitchenRecently she's been symptomatic from this. She is on Timalol eyeddrops. Dr. Carey Bullocks her blood work including lipid profile.  She  did have a right total knee replacement by Dr.Alusiowhich she has had rehabilitation for and is walking better.   She was admitted to [facility] on 07/24/2020 with chest pain and shortness of breath.  Her cardiac enzymes were negative.  Her EKG showed no acute changes.  She was seen by Dr. Skeet Latch and Dr. Johnsie Cancel.  2D echo was unremarkable and chest CTA showed no evidence of pulmonary embolism.  She was discharged home and has had no recurrent symptoms.  Since I saw her 6 months ago she continues to do well.  Her major complaints are of her eyes, hip and knees as well as  stomach.  She has had endoscopy revealing ulcers and is scheduled to see Dr. Kipp Brood for what I assume is robotic fundoplication.    Current Meds  Medication Sig  . brimonidine (ALPHAGAN) 0.15 % ophthalmic solution Place 1 drop into both eyes in the morning, at noon, and at bedtime.  . cetirizine (ZYRTEC) 10 MG tablet Take 10 mg by mouth at bedtime.  [name] dexlansoprazole (DEXILANT) 60 MG capsule Take 1 capsule by mouth daily.  [name] FLUoxetine (PROZAC) 40 MG capsule Take 40 mg by mouth daily.  [name] losartan (COZAAR) 50 MG tablet Take 50 mg by mouth daily.   . montelukast (SINGULAIR) 10 MG tablet Take 10 mg by mouth at bedtime.  [name] nystatin (MYCOSTATIN/NYSTOP) powder Apply 1 g topically daily as needed (for rash).   . nystatin ointment (MYCOSTATIN) Apply 1 application topically 2 (two) times daily as needed (for rash).   . ondansetron (ZOFRAN) 4 MG tablet Take 1 tablet (4 mg total) by mouth every 8 (eight) hours as needed for nausea or vomiting.  . rosuvastatin (CRESTOR) 40 MG tablet Take 40 mg by mouth daily.   . sucralfate (CARAFATE) 1 g tablet TAKE 1 TABLET (1 G TOTAL) BY MOUTH 4 (FOUR) TIMES DAILY - WITH MEALS AND AT BEDTIME.  . traMADol (ULTRAM) 50 MG tablet Take 50 mg by mouth 3 (three) times daily as needed.     Allergies  Allergen Reactions  . Adalat [Nifedipine] Other (See Comments)    Unknown  . Hydrocodone Itching and Nausea And Vomiting  . Meperidine Hcl Other (See  Comments)    Demerol causes hallucinations  . Naproxen Nausea And Vomiting    Social History   Socioeconomic History  . Marital status: Married    Spouse name: Not on file  . Number of children: 8  . Years of education: Not on file  . Highest education level: Not on file  Occupational History  . Occupation: retired    Comment: retired Therapist, art.   Tobacco Use  . Smoking status: Never Smoker  . Smokeless tobacco: Never Used  Vaping Use  . Vaping Use: Never used  Substance and Sexual Activity  .  Alcohol use: No    Alcohol/week: 0.0 standard drinks  . Drug use: No  . Sexual activity: Yes  Other Topics Concern  . Not on file  Social History Narrative   Husband, [name] is Next of Kin. Cell # [phone]   Social Determinants of Health   Financial Resource Strain: Not on file  Food Insecurity: Not on file  Transportation Needs: Not on file  Physical Activity: Not on file  Stress: Not on file  Social Connections: Not on file  Intimate Partner Violence: Not on file     Review of Systems: General: negative for chills, fever, night sweats or weight changes.  Cardiovascular: negative for chest pain, dyspnea on exertion, edema, orthopnea, palpitations, paroxysmal nocturnal dyspnea or shortness of breath Dermatological: negative for rash Respiratory: negative for cough or wheezing Urologic: negative for hematuria Abdominal: negative for nausea, vomiting, diarrhea, bright red blood per rectum, melena, or hematemesis Neurologic: negative for visual changes, syncope, or dizziness All other systems reviewed and are otherwise negative except as noted above.    Blood pressure 128/80, pulse (!) 53, height 5\' 10"  (1.778 m), weight 212 lb 12.8 oz (96.5 kg).  General appearance: alert and no distress Neck: no adenopathy, no carotid bruit, no JVD, supple, symmetrical, trachea midline and thyroid not enlarged, symmetric, no tenderness/mass/nodules Lungs: clear to auscultation bilaterally Heart: regular rate and rhythm, S1, S2 normal, no murmur, click, rub or gallop Extremities: extremities normal, atraumatic, no cyanosis or edema Pulses: 2+ and symmetric Skin: Skin color, texture, turgor normal. No rashes or lesions Neurologic: Alert and oriented X 3, normal strength and tone. Normal symmetric reflexes. Normal coordination and gait  EKG sinus bradycardia 53 with borderline LVH voltage.  I personally reviewed this EKG.  ASSESSMENT AND PLAN:   HLD (hyperlipidemia) History of  hyperlipidemia on high-dose atorvastatin with lipid profile performed 02/07/2021 revealing total cholesterol of 213, LDL 126 and HDL of 75.  I am going to get a coronary calcium score to further guide pharmacologic treatment options.  Sleep apnea History of obstructive sleep apnea on CPAP  Essential hypertension History of essential hypertension blood pressure measured today at 128/80.  She is on losartan.  Bradycardia She isHistory of asymptomatic bradycardia with heart rates in the 50s not on any negative chronotropic drugs.  She is not symptomatic from this.  Atypical chest pain History of atypical chest pain with negative Myoview stress test in the past.  I am going to get a coronary calcium score.  I suspect this is related to reflux.      Lorretta Harp MD FACP,FACC,FAHA, [facility] 02/21/2021 9:44 AM

## 2021-02-21 NOTE — Assessment & Plan Note (Signed)
She isHistory of asymptomatic bradycardia with heart rates in the 50s not on any negative chronotropic drugs.  She is not symptomatic from this.

## 2021-02-21 NOTE — Assessment & Plan Note (Signed)
History of obstructive sleep apnea on CPAP. 

## 2021-02-21 NOTE — Assessment & Plan Note (Signed)
History of hyperlipidemia on high-dose atorvastatin with lipid profile performed 02/07/2021 revealing total cholesterol of 213, LDL 126 and HDL of 75.  I am going to get a coronary calcium score to further guide pharmacologic treatment options.

## 2021-02-21 NOTE — Patient Instructions (Signed)
Medication Instructions:  Your physician recommends that you continue on your current medications as directed. Please refer to the Current Medication list given to you today.  *If you need a refill on your cardiac medications before your next appointment, please call your pharmacy*  Testing/Procedures: Dr. Berry has ordered a CT coronary calcium score. This test is done at [address]. This is $99 out of pocket.   Coronary CalciumScan A coronary calcium scan is an imaging test used to look for deposits of calcium and other fatty materials (plaques) in the inner lining of the blood vessels of the heart (coronary arteries). These deposits of calcium and plaques can partly clog and narrow the coronary arteries without producing any symptoms or warning signs. This puts a person at risk for a heart attack. This test can detect these deposits before symptoms develop. Tell a health care provider about:  Any allergies you have.  All medicines you are taking, including vitamins, herbs, eye drops, creams, and over-the-counter medicines.  Any problems you or family members have had with anesthetic medicines.  Any blood disorders you have.  Any surgeries you have had.  Any medical conditions you have.  Whether you are pregnant or may be pregnant. What are the risks? Generally, this is a safe procedure. However, problems may occur, including:  Harm to a pregnant woman and her unborn baby. This test involves the use of radiation. Radiation exposure can be dangerous to a pregnant woman and her unborn baby. If you are pregnant, you generally should not have this procedure done.  Slight increase in the risk of cancer. This is because of the radiation involved in the test. What happens before the procedure? No preparation is needed for this procedure. What happens during the procedure?  You will undress and remove any jewelry around your neck or chest.  You will put on a  hospital gown.  Sticky electrodes will be placed on your chest. The electrodes will be connected to an electrocardiogram (ECG) machine to record a tracing of the electrical activity of your heart.  A CT scanner will take pictures of your heart. During this time, you will be asked to lie still and hold your breath for 2-3 seconds while a picture of your heart is being taken. The procedure may vary among health care providers and hospitals. What happens after the procedure?  You can get dressed.  You can return to your normal activities.  It is up to you to get the results of your test. Ask your health care provider, or the department that is doing the test, when your results will be ready. Summary  A coronary calcium scan is an imaging test used to look for deposits of calcium and other fatty materials (plaques) in the inner lining of the blood vessels of the heart (coronary arteries).  Generally, this is a safe procedure. Tell your health care provider if you are pregnant or may be pregnant.  No preparation is needed for this procedure.  A CT scanner will take pictures of your heart.  You can return to your normal activities after the scan is done. This information is not intended to replace advice given to you by your health care provider. Make sure you discuss any questions you have with your health care provider. Document Released: 03/29/2008 Document Revised: 08/20/2016 Document Reviewed: 08/20/2016 Elsevier Interactive Patient Education  2017 Elsevier Inc.     Follow-Up: At [facility], you and your health needs are   our priority.  As part of our continuing mission to provide you with exceptional heart care, we have created designated Provider Care Teams.  These Care Teams include your primary Cardiologist (physician) and Advanced Practice Providers (APPs -  Physician Assistants and Nurse Practitioners) who all work together to provide you with the care you need, when you need  it.  We recommend signing up for the patient portal called "MyChart".  Sign up information is provided on this After Visit Summary.  MyChart is used to connect with patients for Virtual Visits (Telemedicine).  Patients are able to view lab/test results, encounter notes, upcoming appointments, etc.  Non-urgent messages can be sent to your provider as well.   To learn more about what you can do with MyChart, go to [email or web address].    Your next appointment:   12 month(s)  The format for your next appointment:   In Person  Provider:   Jonathan Berry, MD 

## 2021-02-24 ENCOUNTER — Encounter: Payer: Self-pay | Admitting: Thoracic Surgery (Cardiothoracic Vascular Surgery)

## 2021-02-24 ENCOUNTER — Other Ambulatory Visit: Payer: Self-pay | Admitting: *Deleted

## 2021-02-24 ENCOUNTER — Institutional Professional Consult (permissible substitution) (INDEPENDENT_AMBULATORY_CARE_PROVIDER_SITE_OTHER): Payer: Medicare Other | Admitting: Thoracic Surgery (Cardiothoracic Vascular Surgery)

## 2021-02-24 ENCOUNTER — Other Ambulatory Visit: Payer: Self-pay

## 2021-02-24 ENCOUNTER — Encounter: Payer: Self-pay | Admitting: *Deleted

## 2021-02-24 VITALS — BP 150/75 | HR 73 | Resp 20 | Ht 70.0 in | Wt 212.0 lb

## 2021-02-24 DIAGNOSIS — H401132 Primary open-angle glaucoma, bilateral, moderate stage: Secondary | ICD-10-CM | POA: Diagnosis not present

## 2021-02-24 DIAGNOSIS — H26491 Other secondary cataract, right eye: Secondary | ICD-10-CM | POA: Diagnosis not present

## 2021-02-24 DIAGNOSIS — K449 Diaphragmatic hernia without obstruction or gangrene: Secondary | ICD-10-CM | POA: Diagnosis not present

## 2021-02-24 DIAGNOSIS — H35352 Cystoid macular degeneration, left eye: Secondary | ICD-10-CM | POA: Diagnosis not present

## 2021-02-24 DIAGNOSIS — H2000 Unspecified acute and subacute iridocyclitis: Secondary | ICD-10-CM | POA: Diagnosis not present

## 2021-02-24 NOTE — H&P (View-Only) (Signed)
[address]       [place] [address]             [place] Record [id number] Date of Birth: 06/24/1941  Referring: Mauri Pole, MD Primary Care: Fanny Bien, MD Primary Cardiologist: Quay Burow, MD  Chief Complaint:    Chief Complaint  Patient presents with  . Diaphragmatic Hernia    Initial surgical consult, CTA chest 10/21, upper endo 3/29, esophageal manometry 4/6    History of Present Illness:    [name] 80 y.o. female referred by Dr.Nandigam for surgical evaluation twice recurrent hiatal hernia.  She originally underwent a Nissen fundoplication in [id number] with revisions in 2009.  Is unclear what occurred in 2009 but the patient states that multiple adhesions were lysed which was placed between her stomach.  Unfortunately there is no op reports to review this.  Regards to her symptoms she endorses worsening reflux as well as a chronic cough.  She has some dysphagia with meat.  She is on 2 medications for her acid reflux but continues to have some symptoms.       Zubrod Score: At the time of surgery this patient's most appropriate activity status/level should be described as: [x]     0    Normal activity, no symptoms []     1    Restricted in physical strenuous activity but ambulatory, able to do out light work []     2    Ambulatory and capable of self care, unable to do work activities, up and about               >50 % of waking hours                              []     3    Only limited self care, in bed greater than 50% of waking hours []     4    Completely disabled, no self care, confined to bed or chair []     5    Moribund   Past Medical History:  Diagnosis Date  . Allergy   . Anal or rectal pain    sometimes  . Anemia    hx of during pregnancy  . Anxiety   . Arthritis   . Asthma    hx of  . Bradycardia    " I KNOW I HAVE BRADYCARDIA ESPECIALLY WHEN I SLEEP"   .  Cataract 2021   bilateral eyes  . Chronic back pain   . Degenerative joint disease    osteo  . Depression   . Diverticulosis 2003  . Dysrhythmia    hx of  due to eye drop and also low heart rate 40's per pt. Dr. Gwenlyn Found follows  . Elevated total protein   . Esophageal dysmotility   . Fibromyalgia   . GERD (gastroesophageal reflux disease)    subsequent Nissen Fundoplication  . Glaucoma   . Hearing loss   . Heart murmur    "was told she had a heart murmur"  . Hemorrhoids   . Hiatal hernia 11/08/09  . Hx of adenomatous colonic polyps 07/02/02  . Hypercalcemia   . Hyperlipidemia   . Hyperlipidemia   . Hypertension   . Nausea   .  Osteoporosis   . PONV (postoperative nausea and vomiting)   . Rectal bleeding    from hemorrhoids.    . Sleep apnea    DOES USE CPAP   . Thrombocytopenia ([place])   . Varicose veins of left lower extremity     Past Surgical History:  Procedure Laterality Date  . CARDIAC CATHETERIZATION  03/14/1992   Normal cardiac cath. Normal LV function.  [name] CARDIOVASCULAR STRESS TEST  01/22/2011   No scintigraphic evidence of inducible ischemia.  [name] CAROTID DOPPLER  03/31/2007   Bilateral ICAs - no evidence of significant diameter reduction, dissectin, tortuosity, FMD, or any other vascular abnormality.  [name] CATARACT EXTRACTION, BILATERAL    . ESOPHAGEAL MANOMETRY N/A 11/14/2015   Procedure: ESOPHAGEAL MANOMETRY (EM);  Surgeon: Mauri Pole, MD;  Location: [facility];  Service: Endoscopy;  Laterality: N/A;  . ESOPHAGEAL MANOMETRY N/A 01/18/2021   Procedure: ESOPHAGEAL MANOMETRY (EM);  Surgeon: Mauri Pole, MD;  Location: [facility];  Service: Endoscopy;  Laterality: N/A;  . GASTRIC RESECTION  2009  . GLAUCOMA SURGERY    . JOINT REPLACEMENT     right knee Dr. Wynelle Link 06-23-18  . KNEE SURGERY Bilateral   . NISSEN FUNDOPLICATION  5366   with subsequent takedown in 2009  . TOTAL KNEE ARTHROPLASTY Left 06/10/2017   Procedure: LEFT  TOTAL KNEE ARTHROPLASTY;   Surgeon: Gaynelle Arabian, MD;  Location: [facility];  Service: Orthopedics;  Laterality: Left;  Adductor Block  . TOTAL KNEE ARTHROPLASTY Right 06/23/2018   Procedure: RIGHT TOTAL KNEE ARTHROPLASTY;  Surgeon: Gaynelle Arabian, MD;  Location: [facility];  Service: Orthopedics;  Laterality: Right;  . TRANSTHORACIC ECHOCARDIOGRAM  12/21/2010   EF 60%, moderate LVH,     Family History  Problem Relation Age of Onset  . Heart disease Mother   . Hypertension Mother   . Diabetes Mother   . Diabetes Father   . Kidney disease Father   . Breast cancer Daughter   . Stomach cancer Maternal Aunt   . Leukemia Maternal Uncle   . Prostate cancer Maternal Uncle   . Stroke Brother   . Cancer Maternal Grandmother   . Kidney disease Maternal Grandfather   . Uterine cancer Maternal Aunt   . Colon cancer Neg Hx   . Rectal cancer Neg Hx   . Esophageal cancer Neg Hx      Social History   Tobacco Use  Smoking Status Never Smoker  Smokeless Tobacco Never Used    Social History   Substance and Sexual Activity  Alcohol Use No  . Alcohol/week: 0.0 standard drinks     Allergies  Allergen Reactions  . Adalat [Nifedipine] Other (See Comments)    Unknown  . Hydrocodone Itching and Nausea And Vomiting  . Meperidine Hcl Other (See Comments)    Demerol causes hallucinations  . Naproxen Nausea And Vomiting    Current Outpatient Medications  Medication Sig Dispense Refill  . brimonidine (ALPHAGAN) 0.15 % ophthalmic solution Place 1 drop into both eyes in the morning, at noon, and at bedtime.    . cetirizine (ZYRTEC) 10 MG tablet Take 10 mg by mouth at bedtime.    [name] dexlansoprazole (DEXILANT) 60 MG capsule Take 1 capsule by mouth daily.    [name] FLUoxetine (PROZAC) 40 MG capsule Take 40 mg by mouth daily.    [name] losartan (COZAAR) 50 MG tablet Take 50 mg by mouth daily.     . montelukast (SINGULAIR) 10 MG tablet Take 10 mg by mouth at  bedtime.    [name] nystatin (MYCOSTATIN/NYSTOP) powder Apply 1 g topically daily as  needed (for rash).   3  . nystatin ointment (MYCOSTATIN) Apply 1 application topically 2 (two) times daily as needed (for rash).   0  . ondansetron (ZOFRAN) 4 MG tablet Take 1 tablet (4 mg total) by mouth every 8 (eight) hours as needed for nausea or vomiting. 60 tablet 1  . rosuvastatin (CRESTOR) 40 MG tablet Take 40 mg by mouth daily.     . sucralfate (CARAFATE) 1 g tablet TAKE 1 TABLET (1 G TOTAL) BY MOUTH 4 (FOUR) TIMES DAILY - WITH MEALS AND AT BEDTIME. 120 tablet 2  . traMADol (ULTRAM) 50 MG tablet Take 50 mg by mouth 3 (three) times daily as needed.     No current facility-administered medications for this visit.    Review of Systems  Constitutional: Positive for malaise/fatigue. Negative for weight loss.  Respiratory: Positive for cough.   Cardiovascular: Positive for chest pain.  Gastrointestinal: Positive for abdominal pain and heartburn.  Musculoskeletal: Positive for joint pain and myalgias.     PHYSICAL EXAMINATION: BP (!) 150/75   Pulse 73   Resp 20   Ht 5\' 10"  (1.778 m)   Wt 212 lb (96.2 kg)   SpO2 92% Comment: RA  BMI 30.42 kg/m  Physical Exam Constitutional:      General: She is not in acute distress.    Appearance: Normal appearance. She is normal weight. She is not ill-appearing.  HENT:     Head: Normocephalic and atraumatic.  Eyes:     Extraocular Movements: Extraocular movements intact.  Cardiovascular:     Rate and Rhythm: Normal rate.  Pulmonary:     Effort: Pulmonary effort is normal.  Abdominal:     General: Abdomen is flat.    Musculoskeletal:     Cervical back: Normal range of motion.  Neurological:     Mental Status: She is alert.     Diagnostic Studies & Laboratory data:    CT Scan: October 2021 small hiatal hernia  EGD: -March 2020 to the lumen of the lower third of the esophagus was mildly dilated. - Two cratered and linear esophageal ulcers were found 34 to 36 cm from the incisors. The largest lesion was 8 mm in largest  dimension. Biopsies were taken with a cold forceps for histology. - A 5 cm hiatal hernia was present. - Few cratered, linear and superficial gastric ulcers were found in the cardia. The largest lesion was 10 mm in largest dimension. Biopsies were taken with a cold forceps for histology. - Evidence of a Nissen fundoplication was found in the cardia. The wrap appeared not intact. This was traversed. - The examined duodenum was normal. Manometry: April 2022.  Normal pressures.  Evidence of hiatal hernia.  Good motility.      I have independently reviewed the above radiology studies  and reviewed the findings with the patient.   Recent Lab Findings: Lab Results  Component Value Date   WBC 4.9 07/24/2020   HGB 13.4 07/24/2020   HCT 41.3 07/24/2020   PLT 165 07/24/2020   GLUCOSE 93 07/24/2020   CHOL 200 (H) 10/26/2020   TRIG 46 10/26/2020   HDL 76 10/26/2020   LDLCALC 115 (H) 10/26/2020   ALT 12 10/26/2020   AST 18 10/26/2020   NA 139 07/24/2020   K 3.9 07/24/2020   CL 103 07/24/2020   CREATININE 0.99 07/24/2020   BUN 20 07/24/2020   CO2  26 07/24/2020   TSH 1.343 07/24/2020   INR 1.72 07/04/2018   HGBA1C (H) 12/20/2010    6.3 (NOTE)                                                                       According to the ADA Clinical Practice Recommendations for 2011, when HbA1c is used as a screening test:   >=6.5%   Diagnostic of Diabetes Mellitus           (if abnormal result  is confirmed)  5.7-6.4%   Increased risk of developing Diabetes Mellitus  References:Diagnosis and Classification of Diabetes Mellitus,Diabetes DTOI,7124,58(KDXIP 1):S62-S69 and Standards of Medical Care in         Diabetes - 2011,Diabetes Care,2011,34  (Suppl 1):S11-S61.      Problem List: Recurrent hiatal hernia. Gastroesophageal reflux. Esophageal ulcers. Question of previous mesh use and hiatal hernia repair.   Assessment / Plan:   80 year old female presents with a recurrent hiatal hernia.   She has undergone 2 previous surgeries for this problem, and there is a question about the use of mesh with the last repair.  On the review of her esophagoscopy she does have esophageal ulcers.  We discussed the risks and benefits of upper endoscopy, robotic assisted laparoscopy with lysis of adhesions, and redo hiatal hernia repair.  She is agreeable to proceed.  She will be scheduled in the next 1 to 2 weeks.  I will obtain surgical records from her previous hiatal hernia.     I  spent 40 minutes with the patient face to face counseling and coordination of care.    [name] 02/24/2021 11:03 AM

## 2021-02-24 NOTE — Progress Notes (Signed)
[address]       [place] [address]             [place] Record [id number] Date of Birth: [birth year]  Referring: Mauri Pole, MD Primary Care: Fanny Bien, MD Primary Cardiologist: Quay Burow, MD  Chief Complaint:    Chief Complaint  Patient presents with  . Diaphragmatic Hernia    Initial surgical consult, CTA chest 10/21, upper endo 3/29, esophageal manometry 4/6    History of Present Illness:    [name] 80 y.o. female referred by Dr.Nandigam for surgical evaluation twice recurrent hiatal hernia.  She originally underwent a Nissen fundoplication in [id number] with revisions in 2009.  Is unclear what occurred in 2009 but the patient states that multiple adhesions were lysed which was placed between her stomach.  Unfortunately there is no op reports to review this.  Regards to her symptoms she endorses worsening reflux as well as a chronic cough.  She has some dysphagia with meat.  She is on 2 medications for her acid reflux but continues to have some symptoms.       Zubrod Score: At the time of surgery this patient's most appropriate activity status/level should be described as: [x]     0    Normal activity, no symptoms []     1    Restricted in physical strenuous activity but ambulatory, able to do out light work []     2    Ambulatory and capable of self care, unable to do work activities, up and about               >50 % of waking hours                              []     3    Only limited self care, in bed greater than 50% of waking hours []     4    Completely disabled, no self care, confined to bed or chair []     5    Moribund   Past Medical History:  Diagnosis Date  . Allergy   . Anal or rectal pain    sometimes  . Anemia    hx of during pregnancy  . Anxiety   . Arthritis   . Asthma    hx of  . Bradycardia    " I KNOW I HAVE BRADYCARDIA ESPECIALLY WHEN I SLEEP"   .  Cataract 2021   bilateral eyes  . Chronic back pain   . Degenerative joint disease    osteo  . Depression   . Diverticulosis 2003  . Dysrhythmia    hx of  due to eye drop and also low heart rate 40's per pt. Dr. Gwenlyn Found follows  . Elevated total protein   . Esophageal dysmotility   . Fibromyalgia   . GERD (gastroesophageal reflux disease)    subsequent Nissen Fundoplication  . Glaucoma   . Hearing loss   . Heart murmur    "was told she had a heart murmur"  . Hemorrhoids   . Hiatal hernia 11/08/09  . Hx of adenomatous colonic polyps 07/02/02  . Hypercalcemia   . Hyperlipidemia   . Hyperlipidemia   . Hypertension   . Nausea   .  Osteoporosis   . PONV (postoperative nausea and vomiting)   . Rectal bleeding    from hemorrhoids.    . Sleep apnea    DOES USE CPAP   . Thrombocytopenia ([place])   . Varicose veins of left lower extremity     Past Surgical History:  Procedure Laterality Date  . CARDIAC CATHETERIZATION  03/14/1992   Normal cardiac cath. Normal LV function.  [name] CARDIOVASCULAR STRESS TEST  01/22/2011   No scintigraphic evidence of inducible ischemia.  [name] CAROTID DOPPLER  03/31/2007   Bilateral ICAs - no evidence of significant diameter reduction, dissectin, tortuosity, FMD, or any other vascular abnormality.  [name] CATARACT EXTRACTION, BILATERAL    . ESOPHAGEAL MANOMETRY N/A 11/14/2015   Procedure: ESOPHAGEAL MANOMETRY (EM);  Surgeon: Mauri Pole, MD;  [facility]: [facility];  Service: Endoscopy;  Laterality: N/A;  . ESOPHAGEAL MANOMETRY N/A 01/18/2021   Procedure: ESOPHAGEAL MANOMETRY (EM);  Surgeon: Mauri Pole, MD;  [facility];  Service: Endoscopy;  Laterality: N/A;  . GASTRIC RESECTION  2009  . GLAUCOMA SURGERY    . JOINT REPLACEMENT     right knee Dr. Wynelle Link 06-23-18  . KNEE SURGERY Bilateral   . NISSEN FUNDOPLICATION  5366   with subsequent takedown in 2009  . TOTAL KNEE ARTHROPLASTY Left 06/10/2017   Procedure: LEFT  TOTAL KNEE ARTHROPLASTY;   Surgeon: Gaynelle Arabian, MD;  Location: [facility];  Service: Orthopedics;  Laterality: Left;  Adductor Block  . TOTAL KNEE ARTHROPLASTY Right 06/23/2018   Procedure: RIGHT TOTAL KNEE ARTHROPLASTY;  Surgeon: Gaynelle Arabian, MD;  Location: [facility];  Service: Orthopedics;  Laterality: Right;  . TRANSTHORACIC ECHOCARDIOGRAM  12/21/2010   EF 60%, moderate LVH,     Family History  Problem Relation Age of Onset  . Heart disease Mother   . Hypertension Mother   . Diabetes Mother   . Diabetes Father   . Kidney disease Father   . Breast cancer Daughter   . Stomach cancer Maternal Aunt   . Leukemia Maternal Uncle   . Prostate cancer Maternal Uncle   . Stroke Brother   . Cancer Maternal Grandmother   . Kidney disease Maternal Grandfather   . Uterine cancer Maternal Aunt   . Colon cancer Neg Hx   . Rectal cancer Neg Hx   . Esophageal cancer Neg Hx      Social History   Tobacco Use  Smoking Status Never Smoker  Smokeless Tobacco Never Used    Social History   Substance and Sexual Activity  Alcohol Use No  . Alcohol/week: 0.0 standard drinks     Allergies  Allergen Reactions  . Adalat [Nifedipine] Other (See Comments)    Unknown  . Hydrocodone Itching and Nausea And Vomiting  . Meperidine Hcl Other (See Comments)    Demerol causes hallucinations  . Naproxen Nausea And Vomiting    Current Outpatient Medications  Medication Sig Dispense Refill  . brimonidine (ALPHAGAN) 0.15 % ophthalmic solution Place 1 drop into both eyes in the morning, at noon, and at bedtime.    . cetirizine (ZYRTEC) 10 MG tablet Take 10 mg by mouth at bedtime.    [name] dexlansoprazole (DEXILANT) 60 MG capsule Take 1 capsule by mouth daily.    [name] FLUoxetine (PROZAC) 40 MG capsule Take 40 mg by mouth daily.    [name] losartan (COZAAR) 50 MG tablet Take 50 mg by mouth daily.     . montelukast (SINGULAIR) 10 MG tablet Take 10 mg by mouth at  bedtime.    [name] nystatin (MYCOSTATIN/NYSTOP) powder Apply 1 g topically daily as  needed (for rash).   3  . nystatin ointment (MYCOSTATIN) Apply 1 application topically 2 (two) times daily as needed (for rash).   0  . ondansetron (ZOFRAN) 4 MG tablet Take 1 tablet (4 mg total) by mouth every 8 (eight) hours as needed for nausea or vomiting. 60 tablet 1  . rosuvastatin (CRESTOR) 40 MG tablet Take 40 mg by mouth daily.     . sucralfate (CARAFATE) 1 g tablet TAKE 1 TABLET (1 G TOTAL) BY MOUTH 4 (FOUR) TIMES DAILY - WITH MEALS AND AT BEDTIME. 120 tablet 2  . traMADol (ULTRAM) 50 MG tablet Take 50 mg by mouth 3 (three) times daily as needed.     No current facility-administered medications for this visit.    Review of Systems  Constitutional: Positive for malaise/fatigue. Negative for weight loss.  Respiratory: Positive for cough.   Cardiovascular: Positive for chest pain.  Gastrointestinal: Positive for abdominal pain and heartburn.  Musculoskeletal: Positive for joint pain and myalgias.     PHYSICAL EXAMINATION: BP (!) 150/75   Pulse 73   Resp 20   Ht 5\' 10"  (1.778 m)   Wt 212 lb (96.2 kg)   SpO2 92% Comment: RA  BMI 30.42 kg/m  Physical Exam Constitutional:      General: She is not in acute distress.    Appearance: Normal appearance. She is normal weight. She is not ill-appearing.  HENT:     Head: Normocephalic and atraumatic.  Eyes:     Extraocular Movements: Extraocular movements intact.  Cardiovascular:     Rate and Rhythm: Normal rate.  Pulmonary:     Effort: Pulmonary effort is normal.  Abdominal:     General: Abdomen is flat.    Musculoskeletal:     Cervical back: Normal range of motion.  Neurological:     Mental Status: She is alert.     Diagnostic Studies & Laboratory data:    CT Scan: October 2021 small hiatal hernia  EGD: -March 2020 to the lumen of the lower third of the esophagus was mildly dilated. - Two cratered and linear esophageal ulcers were found 34 to 36 cm from the incisors. The largest lesion was 8 mm in largest  dimension. Biopsies were taken with a cold forceps for histology. - A 5 cm hiatal hernia was present. - Few cratered, linear and superficial gastric ulcers were found in the cardia. The largest lesion was 10 mm in largest dimension. Biopsies were taken with a cold forceps for histology. - Evidence of a Nissen fundoplication was found in the cardia. The wrap appeared not intact. This was traversed. - The examined duodenum was normal. Manometry: April 2022.  Normal pressures.  Evidence of hiatal hernia.  Good motility.      I have independently reviewed the above radiology studies  and reviewed the findings with the patient.   Recent Lab Findings: Lab Results  Component Value Date   WBC 4.9 07/24/2020   HGB 13.4 07/24/2020   HCT 41.3 07/24/2020   PLT 165 07/24/2020   GLUCOSE 93 07/24/2020   CHOL 200 (H) 10/26/2020   TRIG 46 10/26/2020   HDL 76 10/26/2020   LDLCALC 115 (H) 10/26/2020   ALT 12 10/26/2020   AST 18 10/26/2020   NA 139 07/24/2020   K 3.9 07/24/2020   CL 103 07/24/2020   CREATININE 0.99 07/24/2020   BUN 20 07/24/2020   CO2  26 07/24/2020   TSH 1.343 07/24/2020   INR 1.72 07/04/2018   HGBA1C (H) 12/20/2010    6.3 (NOTE)                                                                       According to the ADA Clinical Practice Recommendations for 2011, when HbA1c is used as a screening test:   >=6.5%   Diagnostic of Diabetes Mellitus           (if abnormal result  is confirmed)  5.7-6.4%   Increased risk of developing Diabetes Mellitus  References:Diagnosis and Classification of Diabetes Mellitus,Diabetes DTOI,7124,58(KDXIP 1):S62-S69 and Standards of Medical Care in         Diabetes - 2011,Diabetes Care,2011,34  (Suppl 1):S11-S61.      Problem List: Recurrent hiatal hernia. Gastroesophageal reflux. Esophageal ulcers. Question of previous mesh use and hiatal hernia repair.   Assessment / Plan:   80 year old female presents with a recurrent hiatal hernia.   She has undergone 2 previous surgeries for this problem, and there is a question about the use of mesh with the last repair.  On the review of her esophagoscopy she does have esophageal ulcers.  We discussed the risks and benefits of upper endoscopy, robotic assisted laparoscopy with lysis of adhesions, and redo hiatal hernia repair.  She is agreeable to proceed.  She will be scheduled in the next 1 to 2 weeks.  I will obtain surgical records from her previous hiatal hernia.     I  spent 40 minutes with the patient face to face counseling and coordination of care.    Lajuana Matte 02/24/2021 11:03 AM

## 2021-03-03 NOTE — Progress Notes (Signed)
Surgical Instructions    Your procedure is scheduled on 03/08/21.  Report to [facility] Main Entrance "A" at 05:30 A.M., then check in with the Admitting office.  Call this number if you have problems the morning of surgery:  [phone]   If you have any questions prior to your surgery date call [phone]: Open Monday-Friday 8am-4pm    Remember:  Do not eat or drink after midnight the night before your surgery    Take these medicines the morning of surgery with A SIP OF WATER  brimonidine (ALPHAGAN) eye drops dexlansoprazole (DEXILANT) FLUoxetine (PROZAC)  ondansetron (ZOFRAN) if needed prednisoLONE acetate (PRED FORTE eye drops   As of today, STOP taking any Aspirin (unless otherwise instructed by your surgeon) Aleve, Naproxen, Ibuprofen, Motrin, Advil, Goody's, BC's, all herbal medications, fish oil, and all vitamins.                     Do not wear jewelry, make up, or nail polish            Do not wear lotions, powders, perfumes, or deodorant.            Do not shave 48 hours prior to surgery.             Do not bring valuables to the hospital.            [facility] is not responsible for any belongings or valuables.  Do NOT Smoke (Tobacco/Vaping) or drink Alcohol 24 hours prior to your procedure If you use a CPAP at night, you may bring all equipment for your overnight stay.   Contacts, glasses, dentures or bridgework may not be worn into surgery, please bring cases for these belongings   For patients admitted to the hospital, discharge time will be determined by your treatment team.   Patients discharged the day of surgery will not be allowed to drive home, and someone needs to stay with them for 24 hours.    Special instructions:    Oral Hygiene is also important to reduce your risk of infection.  Remember - BRUSH YOUR TEETH THE MORNING OF SURGERY WITH YOUR REGULAR TOOTHPASTE   [facility]- Preparing For Surgery  Before surgery, you can play an important  role. Because skin is not sterile, your skin needs to be as free of germs as possible. You can reduce the number of germs on your skin by washing with CHG (chlorahexidine gluconate) Soap before surgery.  CHG is an antiseptic cleaner which kills germs and bonds with the skin to continue killing germs even after washing.     Please do not use if you have an allergy to CHG or antibacterial soaps. If your skin becomes reddened/irritated stop using the CHG.  Do not shave (including legs and underarms) for at least 48 hours prior to first CHG shower. It is OK to shave your face.  Please follow these instructions carefully.    1.  Shower the NIGHT BEFORE SURGERY and the MORNING OF SURGERY with CHG Soap.   If you chose to wash your hair, wash your hair first as usual with your normal shampoo. After you shampoo, rinse your hair and body thoroughly to remove the shampoo.  Then [employer] and genitals (private parts) with your normal soap and rinse thoroughly to remove soap.  2. After that Use CHG Soap as you would any other liquid soap. You can apply CHG directly to the skin and wash gently with a scrungie  or a clean washcloth.   3. Apply the CHG Soap to your body ONLY FROM THE NECK DOWN.  Do not use on open wounds or open sores. Avoid contact with your eyes, ears, mouth and genitals (private parts). Wash Face and genitals (private parts)  with your normal soap.   4. Wash thoroughly, paying special attention to the area where your surgery will be performed.  5. Thoroughly rinse your body with warm water from the neck down.  6. DO NOT shower/wash with your normal soap after using and rinsing off the CHG Soap.  7. Pat yourself dry with a CLEAN TOWEL.  8. Wear CLEAN PAJAMAS to bed the night before surgery  9. Place CLEAN SHEETS on your bed the night before your surgery  10. DO NOT SLEEP WITH PETS.   Day of Surgery: Take a shower with CHG soap. Wear Clean/Comfortable clothing the morning of  surgery Do not apply any deodorants/lotions.   Remember to brush your teeth WITH YOUR REGULAR TOOTHPASTE.   Please read over the following fact sheets that you were given.

## 2021-03-06 ENCOUNTER — Encounter (HOSPITAL_COMMUNITY): Payer: Self-pay

## 2021-03-06 ENCOUNTER — Ambulatory Visit (HOSPITAL_COMMUNITY)
Admission: RE | Admit: 2021-03-06 | Discharge: 2021-03-06 | Disposition: A | Discharge status: Home / Self Care | Payer: Medicare Other | Source: Ambulatory Visit | Attending: Thoracic Surgery (Cardiothoracic Vascular Surgery) | Admitting: Thoracic Surgery (Cardiothoracic Vascular Surgery)

## 2021-03-06 ENCOUNTER — Other Ambulatory Visit: Payer: Self-pay

## 2021-03-06 ENCOUNTER — Encounter (HOSPITAL_COMMUNITY)
Admission: RE | Admit: 2021-03-06 | Discharge: 2021-03-06 | Disposition: A | Discharge status: Home / Self Care | Payer: Medicare Other | Source: Ambulatory Visit | Attending: Thoracic Surgery (Cardiothoracic Vascular Surgery) | Admitting: Thoracic Surgery (Cardiothoracic Vascular Surgery)

## 2021-03-06 DIAGNOSIS — K449 Diaphragmatic hernia without obstruction or gangrene: Secondary | ICD-10-CM | POA: Insufficient documentation

## 2021-03-06 DIAGNOSIS — R918 Other nonspecific abnormal finding of lung field: Secondary | ICD-10-CM | POA: Insufficient documentation

## 2021-03-06 DIAGNOSIS — Z803 Family history of malignant neoplasm of breast: Secondary | ICD-10-CM | POA: Diagnosis not present

## 2021-03-06 DIAGNOSIS — M797 Fibromyalgia: Secondary | ICD-10-CM | POA: Diagnosis not present

## 2021-03-06 DIAGNOSIS — I10 Essential (primary) hypertension: Secondary | ICD-10-CM | POA: Diagnosis not present

## 2021-03-06 DIAGNOSIS — Z9842 Cataract extraction status, left eye: Secondary | ICD-10-CM | POA: Diagnosis not present

## 2021-03-06 DIAGNOSIS — Z8249 Family history of ischemic heart disease and other diseases of the circulatory system: Secondary | ICD-10-CM | POA: Diagnosis not present

## 2021-03-06 DIAGNOSIS — Y838 Other surgical procedures as the cause of abnormal reaction of the patient, or of later complication, without mention of misadventure at the time of the procedure: Secondary | ICD-10-CM | POA: Diagnosis not present

## 2021-03-06 DIAGNOSIS — Z885 Allergy status to narcotic agent status: Secondary | ICD-10-CM | POA: Diagnosis not present

## 2021-03-06 DIAGNOSIS — Z9841 Cataract extraction status, right eye: Secondary | ICD-10-CM | POA: Diagnosis not present

## 2021-03-06 DIAGNOSIS — K2100 Gastro-esophageal reflux disease with esophagitis, without bleeding: Secondary | ICD-10-CM | POA: Diagnosis not present

## 2021-03-06 DIAGNOSIS — Z20822 Contact with and (suspected) exposure to covid-19: Secondary | ICD-10-CM | POA: Insufficient documentation

## 2021-03-06 DIAGNOSIS — Z8049 Family history of malignant neoplasm of other genital organs: Secondary | ICD-10-CM | POA: Diagnosis not present

## 2021-03-06 DIAGNOSIS — Z888 Allergy status to other drugs, medicaments and biological substances status: Secondary | ICD-10-CM | POA: Diagnosis not present

## 2021-03-06 DIAGNOSIS — I959 Hypotension, unspecified: Secondary | ICD-10-CM | POA: Diagnosis not present

## 2021-03-06 DIAGNOSIS — Z806 Family history of leukemia: Secondary | ICD-10-CM | POA: Diagnosis not present

## 2021-03-06 DIAGNOSIS — Z86010 Personal history of colonic polyps: Secondary | ICD-10-CM | POA: Diagnosis not present

## 2021-03-06 DIAGNOSIS — R053 Chronic cough: Secondary | ICD-10-CM | POA: Diagnosis not present

## 2021-03-06 DIAGNOSIS — Z01818 Encounter for other preprocedural examination: Secondary | ICD-10-CM | POA: Insufficient documentation

## 2021-03-06 DIAGNOSIS — E119 Type 2 diabetes mellitus without complications: Secondary | ICD-10-CM | POA: Diagnosis not present

## 2021-03-06 DIAGNOSIS — E669 Obesity, unspecified: Secondary | ICD-10-CM | POA: Diagnosis not present

## 2021-03-06 DIAGNOSIS — Z6830 Body mass index (BMI) 30.0-30.9, adult: Secondary | ICD-10-CM | POA: Diagnosis not present

## 2021-03-06 DIAGNOSIS — K2210 Ulcer of esophagus without bleeding: Secondary | ICD-10-CM | POA: Diagnosis not present

## 2021-03-06 DIAGNOSIS — K9161 Intraoperative hemorrhage and hematoma of a digestive system organ or structure complicating a digestive sytem procedure: Secondary | ICD-10-CM | POA: Diagnosis not present

## 2021-03-06 DIAGNOSIS — Z833 Family history of diabetes mellitus: Secondary | ICD-10-CM | POA: Diagnosis not present

## 2021-03-06 DIAGNOSIS — Z96653 Presence of artificial knee joint, bilateral: Secondary | ICD-10-CM | POA: Diagnosis not present

## 2021-03-06 DIAGNOSIS — F32A Depression, unspecified: Secondary | ICD-10-CM | POA: Diagnosis not present

## 2021-03-06 HISTORY — DX: Type 2 diabetes mellitus without complications: E11.9

## 2021-03-06 LAB — BLOOD GAS, ARTERIAL
Acid-Base Excess: 1.6 mmol/L (ref 0.0–2.0)
Bicarbonate: 26.1 mmol/L (ref 20.0–28.0)
Drawn by: 58793
FIO2: 21
O2 Saturation: 97.2 %
Patient temperature: 37
pCO2 arterial: 44.5 mmHg (ref 32.0–48.0)
pH, Arterial: 7.386 (ref 7.350–7.450)
pO2, Arterial: 96.5 mmHg (ref 83.0–108.0)

## 2021-03-06 LAB — URINALYSIS, ROUTINE W REFLEX MICROSCOPIC
Bacteria, UA: NONE SEEN
Bilirubin Urine: NEGATIVE
Glucose, UA: NEGATIVE mg/dL
Hgb urine dipstick: NEGATIVE
Ketones, ur: NEGATIVE mg/dL
Nitrite: NEGATIVE
Protein, ur: NEGATIVE mg/dL
Specific Gravity, Urine: 1.017 (ref 1.005–1.030)
pH: 6 (ref 5.0–8.0)

## 2021-03-06 LAB — CBC
HCT: 39.9 % (ref 36.0–46.0)
Hemoglobin: 12.8 g/dL (ref 12.0–15.0)
MCH: 31.1 pg (ref 26.0–34.0)
MCHC: 32.1 g/dL (ref 30.0–36.0)
MCV: 97.1 fL (ref 80.0–100.0)
Platelets: 155 10*3/uL (ref 150–400)
RBC: 4.11 MIL/uL (ref 3.87–5.11)
RDW: 12.8 % (ref 11.5–15.5)
WBC: 4.6 10*3/uL (ref 4.0–10.5)
nRBC: 0 % (ref 0.0–0.2)

## 2021-03-06 LAB — COMPREHENSIVE METABOLIC PANEL
ALT: 11 U/L (ref 0–44)
AST: 18 U/L (ref 15–41)
Albumin: 3.7 g/dL (ref 3.5–5.0)
Alkaline Phosphatase: 76 U/L (ref 38–126)
Anion gap: 8 (ref 5–15)
BUN: 17 mg/dL (ref 8–23)
CO2: 25 mmol/L (ref 22–32)
Calcium: 9 mg/dL (ref 8.9–10.3)
Chloride: 105 mmol/L (ref 98–111)
Creatinine, Ser: 1.05 mg/dL — ABNORMAL HIGH (ref 0.44–1.00)
GFR, Estimated: 54 mL/min — ABNORMAL LOW (ref >60)
Glucose, Bld: 105 mg/dL — ABNORMAL HIGH (ref 70–99)
Potassium: 4 mmol/L (ref 3.5–5.1)
Sodium: 138 mmol/L (ref 135–145)
Total Bilirubin: 0.5 mg/dL (ref 0.3–1.2)
Total Protein: 7 g/dL (ref 6.5–8.1)

## 2021-03-06 LAB — SURGICAL PCR SCREEN
MRSA, PCR: NEGATIVE
Staphylococcus aureus: NEGATIVE

## 2021-03-06 LAB — TYPE AND SCREEN
ABO/RH(D): B POS
Antibody Screen: NEGATIVE

## 2021-03-06 LAB — SARS CORONAVIRUS 2 (TAT 6-24 HRS): SARS Coronavirus 2: NEGATIVE

## 2021-03-06 LAB — PROTIME-INR
INR: 1.1 (ref 0.8–1.2)
Prothrombin Time: 13.8 s (ref 11.4–15.2)

## 2021-03-06 LAB — APTT: aPTT: 26 s (ref 24–36)

## 2021-03-06 LAB — GLUCOSE, CAPILLARY: Glucose-Capillary: 141 mg/dL — ABNORMAL HIGH (ref 70–99)

## 2021-03-06 NOTE — Progress Notes (Addendum)
Abnormal lab - UA small Leukocytes - Dr. Kipp Brood office was called to notified the result Caryl Pina)

## 2021-03-06 NOTE — Progress Notes (Addendum)
PCP - Rachell Cipro, MD Cardiologist - Quay Burow, MD  PPM/ICD - denies Device Orders - N/A Rep Notified - N/A  Chest x-ray - 03/06/2021 EKG - 02/21/2021 Stress Test - 06/01/2015 ECHO - 07/24/2020 Cardiac Cath - 03/14/1992  Sleep Study - 03/15/2016 CPAP - Yes  CBG today 141 Patient is not checking CBG at home.  A1C - less than 60 days per patient. Requested information from [facility].   Blood Thinner Instructions: N/A Aspirin Instructions: patient was instructed: As of today, STOP taking any Aspirin (unless otherwise instructed by your surgeon) Aleve, Naproxen, Ibuprofen, Motrin, Advil, Goody's, BC's, all herbal medications, fish oil, and all vitamins.  ERAS Protcol - No PRE-SURGERY Ensure or G2- N/A  COVID TEST- 03/06/2021   Anesthesia review: yes; cardiac Hx.  Patient denies shortness of breath, fever, cough and chest pain at PAT appointment   All instructions explained to the patient, with a verbal understanding of the material. Patient agrees to go over the instructions while at home for a better understanding. Patient also instructed to self quarantine after being tested for COVID-19. The opportunity to ask questions was provided.

## 2021-03-07 DIAGNOSIS — H26491 Other secondary cataract, right eye: Secondary | ICD-10-CM | POA: Diagnosis not present

## 2021-03-07 NOTE — Progress Notes (Signed)
Anesthesia Chart Review:   Case: [id number] Date/Time: 03/08/21 0715   Procedures:      XI ROBOTIC ASSISTED LAPAROSCOPY REDO HIATAL HERNIA REPAIR (N/A Chest)     ESOPHAGOGASTRODUODENOSCOPY (EGD) (N/A )   Anesthesia type: General   Pre-op diagnosis: Hiatal hernia   [facility]OM 10 / [facility]   Surgeons: Lajuana Matte, MD      DISCUSSION: Pt is 80 years old with hx bradycardia, HTN, OSA, DM, heart murmur ("no murmur" documented by cardiologist Dr. Gwenlyn Found 02/21/21 visit note), thrombocytopenia  VS: BP 120/70   Pulse (!) 58   Temp 36.9 C (Oral)   Resp 20   Ht 5' 10.5" (1.791 m)   Wt 97.3 kg   SpO2 98%   BMI 30.35 kg/m    PROVIDERS: - PCP is Fanny Bien, MD  - Cardiologist is Quay Burow, MD who is aware of upcoming procedure. 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 Note indicates it's unlikely pt has thrombophilia - Saw hematology in 2014 Sherryl Manges, MD) for mild chronic thrombocytopenia. Notes indicate negative workup, thrombocytopenia likely benign, recommended watchful observation by PCP   LABS: Labs reviewed: Acceptable for surgery. (all labs ordered are listed, but only abnormal results are displayed)  Labs Reviewed  GLUCOSE, CAPILLARY - Abnormal; Notable for the following components:      Result Value   Glucose-Capillary 141 (*)    All other components within normal limits  COMPREHENSIVE METABOLIC PANEL - Abnormal; Notable for the following components:   Glucose, Bld 105 (*)    Creatinine, Ser 1.05 (*)    GFR, Estimated 54 (*)    All other components within normal limits  BLOOD GAS, ARTERIAL - Abnormal; Notable for the following components:   Allens test (pass/fail) BRACHIAL ARTERY (*)    All other components within normal limits  URINALYSIS, ROUTINE W REFLEX MICROSCOPIC - Abnormal; Notable for the following components:   Leukocytes,Ua SMALL (*)    All other components within normal  limits  SARS CORONAVIRUS 2 (TAT 6-24 HRS)  SURGICAL PCR SCREEN  CBC  PROTIME-INR  APTT  TYPE AND SCREEN     IMAGES: CXR 03/06/21:  - Chronic lung changes without definite evidence of acute cardiopulmonary disease   EKG 02/21/21: sinus bradycardia 53 with borderline LVH voltage   CV: Echo 07/24/20:  1. Left ventricular ejection fraction, by estimation, is 60 to 65%. The left ventricle has normal function. The left ventricle has no regional wall motion abnormalities. There is mild left ventricular hypertrophy of  the basal-septal segment. Left ventricular diastolic parameters are consistent with Grade I diastolic dysfunction (impaired relaxation).  2. Right ventricular systolic function is normal. The right ventricular size is normal.  3. Left atrial size was severely dilated.  4. The mitral valve is normal in structure. Trivial mitral valve regurgitation. No evidence of mitral stenosis.  5. The aortic valve is tricuspid. Aortic valve regurgitation is not  visualized. No aortic stenosis is present.  6. There is mild dilatation of the ascending aorta, measuring 36 mm.  7. The inferior vena cava is normal in size with greater than 50% respiratory variability, suggesting right atrial pressure of 3 mmHg.    Nuclear stress test 06/01/15:   The left ventricular ejection fraction is normal (55-65%).  Nuclear stress EF: 56%.  There was no ST segment deviation noted during stress.  The study is normal.  This is  a low risk study.  Normal myocardial perfusion without scar or ischemia.    Past Medical History:  Diagnosis Date  . Allergy   . Anal or rectal pain    sometimes  . Anemia    hx of during pregnancy  . Anxiety   . Arthritis   . Asthma    hx of  . Bradycardia    " I KNOW I HAVE BRADYCARDIA ESPECIALLY WHEN I SLEEP"   . Cataract 2021   bilateral eyes  . Chronic back pain   . Degenerative joint disease    osteo  . Depression   . 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 Diverticulosis 2003  . Dysrhythmia    hx of  due to eye drop and also low heart rate 40's per pt. Dr. Gwenlyn Found follows  . Elevated total protein   . Esophageal dysmotility   . Fibromyalgia   . GERD (gastroesophageal reflux disease)    subsequent Nissen Fundoplication  . Glaucoma   . Hearing loss   . Heart murmur    "was told she had a heart murmur"  . Hemorrhoids   . Hiatal hernia 11/08/09  . Hx of adenomatous colonic polyps 07/02/02  . Hypercalcemia   . Hyperlipidemia   . Hyperlipidemia   . Hypertension   . Nausea   . Osteoporosis   . PONV (postoperative nausea and vomiting)   . Rectal bleeding    from hemorrhoids.    . Sleep apnea    DOES USE CPAP   . Thrombocytopenia ([place])   . Varicose veins of left lower extremity     Past Surgical History:  Procedure Laterality Date  . CARDIAC CATHETERIZATION  03/14/1992   Normal cardiac cath. Normal LV function.  [name] CARDIOVASCULAR STRESS TEST  01/22/2011   No scintigraphic evidence of inducible ischemia.  [name] CAROTID DOPPLER  03/31/2007   Bilateral ICAs - no evidence of significant diameter reduction, dissectin, tortuosity, FMD, or any other vascular abnormality.  [name] CATARACT EXTRACTION, BILATERAL    . ESOPHAGEAL MANOMETRY N/A 11/14/2015   Procedure: ESOPHAGEAL MANOMETRY (EM);  Surgeon: Mauri Pole, MD;  [facility];  Service: Endoscopy;  Laterality: N/A;  . ESOPHAGEAL MANOMETRY N/A 01/18/2021   Procedure: ESOPHAGEAL MANOMETRY (EM);  Surgeon: Mauri Pole, MD;  [facility];  Service: Endoscopy;  Laterality: N/A;  . EYE SURGERY     bilateral cataracts; bilateral stents  . GASTRIC RESECTION  2009  . GLAUCOMA SURGERY    . JOINT REPLACEMENT     right knee Dr. Wynelle Link 06-23-18  . KNEE SURGERY Bilateral   . NISSEN FUNDOPLICATION  0174   with subsequent takedown in 2009  . TOTAL KNEE ARTHROPLASTY Left 06/10/2017   Procedure: LEFT  TOTAL KNEE ARTHROPLASTY;  Surgeon: Gaynelle Arabian, MD;  Location[facility];  Service: Orthopedics;  Laterality: Left;  Adductor Block  . TOTAL KNEE ARTHROPLASTY Right 06/23/2018   Procedure: RIGHT TOTAL KNEE ARTHROPLASTY;  Surgeon: Gaynelle Arabian, MD;  [facility];  Service: Orthopedics;  Laterality: Right;  . TRANSTHORACIC ECHOCARDIOGRAM  12/21/2010   EF 60%, moderate LVH,   . TUBAL LIGATION      MEDICATIONS: . brimonidine (ALPHAGAN) 0.15 % ophthalmic solution  . cetirizine (ZYRTEC) 10 MG tablet  . Cholecalciferol (VITAMIN D3) 250 MCG (10000 UT) capsule  . dexlansoprazole (DEXILANT) 60 MG capsule  . FLUoxetine (PROZAC) 40 MG capsule  . losartan (COZAAR) 50 MG tablet  . montelukast (SINGULAIR)  10 MG tablet  . nystatin (MYCOSTATIN/NYSTOP) powder  . nystatin ointment (MYCOSTATIN)  . ondansetron (ZOFRAN) 4 MG tablet  . Polyethyl Glycol-Propyl Glycol (SYSTANE OP)  . prednisoLONE acetate (PRED FORTE) 1 % ophthalmic suspension  . rosuvastatin (CRESTOR) 40 MG tablet  . sucralfate (CARAFATE) 1 g tablet  . traMADol (ULTRAM) 50 MG tablet   No current facility-administered medications for this encounter.    If no changes, I anticipate pt can proceed with surgery as scheduled.   Willeen Cass, PhD, FNP-BC [facility] Short Stay Surgical [facility]/Anesthesiology Phone: [phone] 03/07/2021 10:45 AM

## 2021-03-07 NOTE — Anesthesia Preprocedure Evaluation (Addendum)
Anesthesia Evaluation    Reviewed: Allergy & Precautions, Patient's Chart, lab work & pertinent test results  History of Anesthesia Complications (+) PONV  Airway Mallampati: III  TM Distance: >3 FB Neck ROM: Full    Dental  (+) Missing   Pulmonary asthma , sleep apnea and Continuous Positive Airway Pressure Ventilation ,    Pulmonary exam normal breath sounds clear to auscultation       Cardiovascular hypertension, Pt. on medications + DVT  Normal cardiovascular exam Rhythm:Regular Rate:Normal  ECG: SB, rate 53  ECHO: 1. Left ventricular ejection fraction, by estimation, is 60 to 65%. The left ventricle has normal function. The left ventricle has no regional wall motion abnormalities. There is mild left ventricular hypertrophy of the basal-septal segment. Left ventricular diastolic parameters are consistent with Grade I diastolic dysfunction (impaired relaxation). 2. Right ventricular systolic function is normal. The right ventricular size is normal. 3. Left atrial size was severely dilated. 4. The mitral valve is normal in structure. Trivial mitral valve regurgitation. No evidence of mitral stenosis. 5. The aortic valve is tricuspid. Aortic valve regurgitation is not visualized. No aortic stenosis is present. 6. There is mild dilatation of the ascending aorta, measuring 36 mm. 7. The inferior vena cava is normal in size with greater than 50% respiratory variability, suggesting right atrial pressure of 3 mmHg.   Neuro/Psych PSYCHIATRIC DISORDERS Anxiety Depression  Neuromuscular disease    GI/Hepatic Neg liver ROS, hiatal hernia, GERD  Medicated,  Endo/Other  diabetes (Diet controlled)  Renal/GU negative Renal ROS     Musculoskeletal  (+) Arthritis , Fibromyalgia -  Abdominal (+) + obese,   Peds  Hematology HLD   Anesthesia Other Findings Hiatal hernia  Reproductive/Obstetrics                            Anesthesia Physical Anesthesia Plan  ASA: III  Anesthesia Plan: General   Post-op Pain Management:    Induction: Intravenous  PONV Risk Score and Plan: 4 or greater and Ondansetron, Dexamethasone and Treatment may vary due to age or medical condition  Airway Management Planned: Oral ETT  Additional Equipment:   Intra-op Plan:   Post-operative Plan: Extubation in OR  Informed Consent: I have reviewed the patients History and Physical, chart, labs and discussed the procedure including the risks, benefits and alternatives for the proposed anesthesia with the patient or authorized representative who has indicated his/her understanding and acceptance.     Dental advisory given  Plan Discussed with: CRNA  Anesthesia Plan Comments: (Reviewed APP note by Durel Salts, FNP  Potential arterial line and central line discussed )      Anesthesia Quick Evaluation

## 2021-03-08 ENCOUNTER — Other Ambulatory Visit: Payer: Self-pay

## 2021-03-08 ENCOUNTER — Inpatient Hospital Stay (HOSPITAL_COMMUNITY)
Admission: RE | Admit: 2021-03-08 | Discharge: 2021-03-09 | DRG: 327 | Disposition: A | Discharge status: Home / Self Care | Payer: Medicare Other | Attending: Thoracic Surgery (Cardiothoracic Vascular Surgery) | Admitting: Thoracic Surgery (Cardiothoracic Vascular Surgery)

## 2021-03-08 ENCOUNTER — Inpatient Hospital Stay (HOSPITAL_COMMUNITY): Payer: Medicare Other

## 2021-03-08 ENCOUNTER — Inpatient Hospital Stay (HOSPITAL_COMMUNITY): Payer: Medicare Other | Admitting: Emergency Medicine

## 2021-03-08 ENCOUNTER — Inpatient Hospital Stay (HOSPITAL_COMMUNITY): Payer: Medicare Other | Admitting: Anesthesiology

## 2021-03-08 ENCOUNTER — Encounter (HOSPITAL_COMMUNITY): Payer: Self-pay | Admitting: Thoracic Surgery (Cardiothoracic Vascular Surgery)

## 2021-03-08 ENCOUNTER — Encounter (HOSPITAL_COMMUNITY)
Admission: RE | Disposition: A | Discharge status: Home / Self Care | Payer: Self-pay | Source: Home / Self Care | Attending: Thoracic Surgery (Cardiothoracic Vascular Surgery)

## 2021-03-08 DIAGNOSIS — Z79899 Other long term (current) drug therapy: Secondary | ICD-10-CM

## 2021-03-08 DIAGNOSIS — K660 Peritoneal adhesions (postprocedural) (postinfection): Secondary | ICD-10-CM | POA: Diagnosis present

## 2021-03-08 DIAGNOSIS — M797 Fibromyalgia: Secondary | ICD-10-CM | POA: Diagnosis present

## 2021-03-08 DIAGNOSIS — E785 Hyperlipidemia, unspecified: Secondary | ICD-10-CM | POA: Diagnosis present

## 2021-03-08 DIAGNOSIS — K2210 Ulcer of esophagus without bleeding: Secondary | ICD-10-CM | POA: Diagnosis not present

## 2021-03-08 DIAGNOSIS — E669 Obesity, unspecified: Secondary | ICD-10-CM | POA: Diagnosis present

## 2021-03-08 DIAGNOSIS — K219 Gastro-esophageal reflux disease without esophagitis: Secondary | ICD-10-CM | POA: Diagnosis not present

## 2021-03-08 DIAGNOSIS — J984 Other disorders of lung: Secondary | ICD-10-CM | POA: Diagnosis not present

## 2021-03-08 DIAGNOSIS — K224 Dyskinesia of esophagus: Secondary | ICD-10-CM | POA: Diagnosis not present

## 2021-03-08 DIAGNOSIS — Z841 Family history of disorders of kidney and ureter: Secondary | ICD-10-CM

## 2021-03-08 DIAGNOSIS — F419 Anxiety disorder, unspecified: Secondary | ICD-10-CM | POA: Diagnosis present

## 2021-03-08 DIAGNOSIS — Z833 Family history of diabetes mellitus: Secondary | ICD-10-CM | POA: Diagnosis not present

## 2021-03-08 DIAGNOSIS — Z800 Family history of malignant neoplasm of digestive organs: Secondary | ICD-10-CM

## 2021-03-08 DIAGNOSIS — I959 Hypotension, unspecified: Secondary | ICD-10-CM | POA: Diagnosis not present

## 2021-03-08 DIAGNOSIS — Z20822 Contact with and (suspected) exposure to covid-19: Secondary | ICD-10-CM | POA: Diagnosis not present

## 2021-03-08 DIAGNOSIS — K2100 Gastro-esophageal reflux disease with esophagitis, without bleeding: Secondary | ICD-10-CM | POA: Diagnosis not present

## 2021-03-08 DIAGNOSIS — K449 Diaphragmatic hernia without obstruction or gangrene: Secondary | ICD-10-CM | POA: Diagnosis not present

## 2021-03-08 DIAGNOSIS — K9161 Intraoperative hemorrhage and hematoma of a digestive system organ or structure complicating a digestive sytem procedure: Secondary | ICD-10-CM | POA: Diagnosis not present

## 2021-03-08 DIAGNOSIS — Z885 Allergy status to narcotic agent status: Secondary | ICD-10-CM

## 2021-03-08 DIAGNOSIS — Z888 Allergy status to other drugs, medicaments and biological substances status: Secondary | ICD-10-CM | POA: Diagnosis not present

## 2021-03-08 DIAGNOSIS — Z9842 Cataract extraction status, left eye: Secondary | ICD-10-CM | POA: Diagnosis not present

## 2021-03-08 DIAGNOSIS — G8929 Other chronic pain: Secondary | ICD-10-CM | POA: Diagnosis present

## 2021-03-08 DIAGNOSIS — J939 Pneumothorax, unspecified: Secondary | ICD-10-CM | POA: Diagnosis not present

## 2021-03-08 DIAGNOSIS — Z803 Family history of malignant neoplasm of breast: Secondary | ICD-10-CM | POA: Diagnosis not present

## 2021-03-08 DIAGNOSIS — I10 Essential (primary) hypertension: Secondary | ICD-10-CM | POA: Diagnosis present

## 2021-03-08 DIAGNOSIS — E119 Type 2 diabetes mellitus without complications: Secondary | ICD-10-CM | POA: Diagnosis present

## 2021-03-08 DIAGNOSIS — R1310 Dysphagia, unspecified: Secondary | ICD-10-CM | POA: Diagnosis present

## 2021-03-08 DIAGNOSIS — Z9841 Cataract extraction status, right eye: Secondary | ICD-10-CM | POA: Diagnosis not present

## 2021-03-08 DIAGNOSIS — Z6830 Body mass index (BMI) 30.0-30.9, adult: Secondary | ICD-10-CM

## 2021-03-08 DIAGNOSIS — Z86010 Personal history of colonic polyps: Secondary | ICD-10-CM

## 2021-03-08 DIAGNOSIS — Z8249 Family history of ischemic heart disease and other diseases of the circulatory system: Secondary | ICD-10-CM | POA: Diagnosis not present

## 2021-03-08 DIAGNOSIS — G4730 Sleep apnea, unspecified: Secondary | ICD-10-CM | POA: Diagnosis present

## 2021-03-08 DIAGNOSIS — Z8049 Family history of malignant neoplasm of other genital organs: Secondary | ICD-10-CM | POA: Diagnosis not present

## 2021-03-08 DIAGNOSIS — R001 Bradycardia, unspecified: Secondary | ICD-10-CM | POA: Diagnosis not present

## 2021-03-08 DIAGNOSIS — I700 Atherosclerosis of aorta: Secondary | ICD-10-CM | POA: Diagnosis not present

## 2021-03-08 DIAGNOSIS — G4733 Obstructive sleep apnea (adult) (pediatric): Secondary | ICD-10-CM | POA: Diagnosis present

## 2021-03-08 DIAGNOSIS — F32A Depression, unspecified: Secondary | ICD-10-CM | POA: Diagnosis present

## 2021-03-08 DIAGNOSIS — Y838 Other surgical procedures as the cause of abnormal reaction of the patient, or of later complication, without mention of misadventure at the time of the procedure: Secondary | ICD-10-CM | POA: Diagnosis not present

## 2021-03-08 DIAGNOSIS — K9189 Other postprocedural complications and disorders of digestive system: Secondary | ICD-10-CM

## 2021-03-08 DIAGNOSIS — R053 Chronic cough: Secondary | ICD-10-CM | POA: Diagnosis present

## 2021-03-08 DIAGNOSIS — Z96653 Presence of artificial knee joint, bilateral: Secondary | ICD-10-CM | POA: Diagnosis present

## 2021-03-08 DIAGNOSIS — Z806 Family history of leukemia: Secondary | ICD-10-CM | POA: Diagnosis not present

## 2021-03-08 DIAGNOSIS — Z823 Family history of stroke: Secondary | ICD-10-CM

## 2021-03-08 HISTORY — PX: XI ROBOTIC ASSISTED HIATAL HERNIA REPAIR: SHX6889

## 2021-03-08 HISTORY — PX: ESOPHAGOGASTRODUODENOSCOPY: SHX5428

## 2021-03-08 LAB — GLUCOSE, CAPILLARY
Glucose-Capillary: 115 mg/dL — ABNORMAL HIGH (ref 70–99)
Glucose-Capillary: 161 mg/dL — ABNORMAL HIGH (ref 70–99)
Glucose-Capillary: 127 mg/dL — ABNORMAL HIGH (ref 70–99)

## 2021-03-08 SURGERY — REPAIR, HERNIA, HIATAL, ROBOT-ASSISTED
Anesthesia: General

## 2021-03-08 MED ORDER — ONDANSETRON HCL 4 MG/2ML IJ SOLN: 4.0000 mg | Freq: Once | INTRAMUSCULAR | Status: DC | PRN

## 2021-03-08 MED ORDER — PHENYLEPHRINE 40 MCG/ML (10ML) SYRINGE FOR IV PUSH (FOR BLOOD PRESSURE SUPPORT)
PREFILLED_SYRINGE | INTRAVENOUS | Status: AC
  Filled 2021-03-08: qty 10

## 2021-03-08 MED ORDER — BISACODYL 5 MG PO TBEC
10.0000 mg | DELAYED_RELEASE_TABLET | Freq: Every day | ORAL | Status: DC
  Administered 2021-03-09: 10 mg via ORAL
  Filled 2021-03-08: qty 2

## 2021-03-08 MED ORDER — LACTATED RINGERS IV SOLN: INTRAVENOUS | Status: DC | PRN

## 2021-03-08 MED ORDER — MONTELUKAST SODIUM 10 MG PO TABS: 10.0000 mg | ORAL_TABLET | Freq: Every day | ORAL | Status: DC

## 2021-03-08 MED ORDER — DEXAMETHASONE SODIUM PHOSPHATE 10 MG/ML IJ SOLN
INTRAMUSCULAR | Status: DC | PRN
  Administered 2021-03-08: 5 mg via INTRAVENOUS

## 2021-03-08 MED ORDER — BRIMONIDINE TARTRATE 0.15 % OP SOLN
1.0000 [drp] | Freq: Two times a day (BID) | OPHTHALMIC | Status: DC
  Administered 2021-03-08 – 2021-03-09 (×2): 1 [drp] via OPHTHALMIC
  Filled 2021-03-08: qty 5

## 2021-03-08 MED ORDER — SENNOSIDES-DOCUSATE SODIUM 8.6-50 MG PO TABS: 1.0000 | ORAL_TABLET | Freq: Every day | ORAL | Status: DC

## 2021-03-08 MED ORDER — PROPOFOL 10 MG/ML IV BOLUS
INTRAVENOUS | Status: DC | PRN
  Administered 2021-03-08: 100 mg via INTRAVENOUS

## 2021-03-08 MED ORDER — PREDNISOLONE ACETATE 1 % OP SUSP
1.0000 [drp] | Freq: Four times a day (QID) | OPHTHALMIC | Status: DC
  Administered 2021-03-08 – 2021-03-09 (×4): 1 [drp] via OPHTHALMIC
  Filled 2021-03-08: qty 5

## 2021-03-08 MED ORDER — BUPIVACAINE LIPOSOME 1.3 % IJ SUSP
INTRAMUSCULAR | Status: AC
  Filled 2021-03-08: qty 20

## 2021-03-08 MED ORDER — SUCRALFATE 1 G PO TABS
1.0000 g | ORAL_TABLET | Freq: Three times a day (TID) | ORAL | Status: DC
  Administered 2021-03-09 (×2): 1 g via ORAL
  Filled 2021-03-08 (×2): qty 1

## 2021-03-08 MED ORDER — EPHEDRINE SULFATE-NACL 50-0.9 MG/10ML-% IV SOSY
PREFILLED_SYRINGE | INTRAVENOUS | Status: DC | PRN
  Administered 2021-03-08 (×2): 5 mg via INTRAVENOUS

## 2021-03-08 MED ORDER — SODIUM CHLORIDE 0.9 % IV SOLN
INTRAVENOUS | Status: DC | PRN
  Administered 2021-03-08: 50 ug/min via INTRAVENOUS

## 2021-03-08 MED ORDER — FENTANYL CITRATE (PF) 250 MCG/5ML IJ SOLN
INTRAMUSCULAR | Status: AC
  Filled 2021-03-08: qty 5

## 2021-03-08 MED ORDER — EPHEDRINE 5 MG/ML INJ
INTRAVENOUS | Status: AC
  Filled 2021-03-08: qty 10

## 2021-03-08 MED ORDER — ONDANSETRON HCL 4 MG/2ML IJ SOLN: 4.0000 mg | Freq: Four times a day (QID) | INTRAMUSCULAR | Status: DC | PRN

## 2021-03-08 MED ORDER — CHLORHEXIDINE GLUCONATE 0.12 % MT SOLN
OROMUCOSAL | Status: AC
  Administered 2021-03-08: 15 mL
  Filled 2021-03-08: qty 15

## 2021-03-08 MED ORDER — SUGAMMADEX SODIUM 200 MG/2ML IV SOLN
INTRAVENOUS | Status: DC | PRN
  Administered 2021-03-08: 100 mg via INTRAVENOUS
  Administered 2021-03-08: 400 mg via INTRAVENOUS

## 2021-03-08 MED ORDER — AMISULPRIDE (ANTIEMETIC) 5 MG/2ML IV SOLN: 5.0000 mg | Freq: Once | INTRAVENOUS | Status: DC | PRN

## 2021-03-08 MED ORDER — ONDANSETRON HCL 4 MG/2ML IJ SOLN
INTRAMUSCULAR | Status: AC
  Filled 2021-03-08: qty 2

## 2021-03-08 MED ORDER — LIDOCAINE 2% (20 MG/ML) 5 ML SYRINGE
INTRAMUSCULAR | Status: AC
  Filled 2021-03-08: qty 5

## 2021-03-08 MED ORDER — KETOROLAC TROMETHAMINE 15 MG/ML IJ SOLN
15.0000 mg | Freq: Four times a day (QID) | INTRAMUSCULAR | Status: DC
  Administered 2021-03-08 – 2021-03-09 (×4): 15 mg via INTRAVENOUS
  Filled 2021-03-08 (×4): qty 1

## 2021-03-08 MED ORDER — ROSUVASTATIN CALCIUM 20 MG PO TABS: 40.0000 mg | ORAL_TABLET | Freq: Every day | ORAL | Status: DC

## 2021-03-08 MED ORDER — PHENYLEPHRINE HCL (PRESSORS) 10 MG/ML IV SOLN
INTRAVENOUS | Status: DC | PRN
  Administered 2021-03-08: 80 ug via INTRAVENOUS
  Administered 2021-03-08: 120 ug via INTRAVENOUS

## 2021-03-08 MED ORDER — BUPIVACAINE LIPOSOME 1.3 % IJ SUSP
INTRAMUSCULAR | Status: DC | PRN
  Administered 2021-03-08: 50 mL

## 2021-03-08 MED ORDER — LORATADINE 10 MG PO TABS
10.0000 mg | ORAL_TABLET | Freq: Every day | ORAL | Status: DC
  Administered 2021-03-09: 10 mg via ORAL
  Filled 2021-03-08: qty 1

## 2021-03-08 MED ORDER — DEXAMETHASONE SODIUM PHOSPHATE 10 MG/ML IJ SOLN
INTRAMUSCULAR | Status: AC
  Filled 2021-03-08: qty 1

## 2021-03-08 MED ORDER — FENTANYL CITRATE (PF) 250 MCG/5ML IJ SOLN
INTRAMUSCULAR | Status: DC | PRN
  Administered 2021-03-08: 50 ug via INTRAVENOUS
  Administered 2021-03-08: 100 ug via INTRAVENOUS
  Administered 2021-03-08 (×2): 25 ug via INTRAVENOUS
  Administered 2021-03-08: 50 ug via INTRAVENOUS
  Administered 2021-03-08: 25 ug via INTRAVENOUS

## 2021-03-08 MED ORDER — FLUOXETINE HCL 20 MG PO CAPS
40.0000 mg | ORAL_CAPSULE | Freq: Every day | ORAL | Status: DC
  Administered 2021-03-09: 40 mg via ORAL
  Filled 2021-03-08: qty 2

## 2021-03-08 MED ORDER — GLYCOPYRROLATE 0.2 MG/ML IJ SOLN
INTRAMUSCULAR | Status: DC | PRN
  Administered 2021-03-08 (×2): .1 mg via INTRAVENOUS

## 2021-03-08 MED ORDER — CEFAZOLIN SODIUM-DEXTROSE 2-4 GM/100ML-% IV SOLN
2.0000 g | Freq: Three times a day (TID) | INTRAVENOUS | Status: AC
  Administered 2021-03-08 – 2021-03-09 (×2): 2 g via INTRAVENOUS
  Filled 2021-03-08 (×2): qty 100

## 2021-03-08 MED ORDER — CEFAZOLIN SODIUM-DEXTROSE 2-4 GM/100ML-% IV SOLN
2.0000 g | INTRAVENOUS | Status: AC
  Administered 2021-03-08: 2 g via INTRAVENOUS
  Filled 2021-03-08: qty 100

## 2021-03-08 MED ORDER — ROCURONIUM BROMIDE 10 MG/ML (PF) SYRINGE
PREFILLED_SYRINGE | INTRAVENOUS | Status: AC
  Filled 2021-03-08: qty 10

## 2021-03-08 MED ORDER — BUPIVACAINE HCL (PF) 0.5 % IJ SOLN
INTRAMUSCULAR | Status: AC
  Filled 2021-03-08: qty 30

## 2021-03-08 MED ORDER — SODIUM CHLORIDE 0.9 % IV SOLN: INTRAVENOUS | Status: DC | PRN

## 2021-03-08 MED ORDER — EPINEPHRINE PF 1 MG/ML IJ SOLN
INTRAMUSCULAR | Status: AC
  Filled 2021-03-08: qty 1

## 2021-03-08 MED ORDER — FENTANYL CITRATE (PF) 100 MCG/2ML IJ SOLN
25.0000 ug | INTRAMUSCULAR | Status: DC | PRN
  Administered 2021-03-08: 25 ug via INTRAVENOUS

## 2021-03-08 MED ORDER — GLYCOPYRROLATE PF 0.2 MG/ML IJ SOSY
PREFILLED_SYRINGE | INTRAMUSCULAR | Status: AC
  Filled 2021-03-08: qty 1

## 2021-03-08 MED ORDER — KETOROLAC TROMETHAMINE 30 MG/ML IJ SOLN: 30.0000 mg | Freq: Four times a day (QID) | INTRAMUSCULAR | Status: DC

## 2021-03-08 MED ORDER — INSULIN ASPART 100 UNIT/ML IJ SOLN: 0.0000 [IU] | Freq: Four times a day (QID) | INTRAMUSCULAR | Status: DC

## 2021-03-08 MED ORDER — HEMOSTATIC AGENTS (NO CHARGE) OPTIME
TOPICAL | Status: DC | PRN
  Administered 2021-03-08: 1 via TOPICAL

## 2021-03-08 MED ORDER — ONDANSETRON HCL 4 MG/2ML IJ SOLN
INTRAMUSCULAR | Status: DC | PRN
  Administered 2021-03-08: 4 mg via INTRAVENOUS

## 2021-03-08 MED ORDER — TRAMADOL HCL 50 MG PO TABS: 50.0000 mg | ORAL_TABLET | Freq: Four times a day (QID) | ORAL | Status: DC | PRN

## 2021-03-08 MED ORDER — ACETAMINOPHEN 500 MG PO TABS
1000.0000 mg | ORAL_TABLET | Freq: Once | ORAL | Status: AC
  Administered 2021-03-08: 1000 mg via ORAL
  Filled 2021-03-08: qty 2

## 2021-03-08 MED ORDER — LIDOCAINE 2% (20 MG/ML) 5 ML SYRINGE
INTRAMUSCULAR | Status: DC | PRN
  Administered 2021-03-08: 60 mg via INTRAVENOUS

## 2021-03-08 MED ORDER — 0.9 % SODIUM CHLORIDE (POUR BTL) OPTIME
TOPICAL | Status: DC | PRN
  Administered 2021-03-08 (×2): 1000 mL

## 2021-03-08 MED ORDER — LOSARTAN POTASSIUM 25 MG PO TABS: 25.0000 mg | ORAL_TABLET | Freq: Every day | ORAL | Status: DC

## 2021-03-08 MED ORDER — LOSARTAN POTASSIUM 25 MG PO TABS
25.0000 mg | ORAL_TABLET | Freq: Every day | ORAL | Status: DC
  Administered 2021-03-09: 25 mg via ORAL
  Filled 2021-03-08: qty 1

## 2021-03-08 MED ORDER — PROPOFOL 10 MG/ML IV BOLUS
INTRAVENOUS | Status: AC
  Filled 2021-03-08: qty 40

## 2021-03-08 MED ORDER — ROCURONIUM BROMIDE 10 MG/ML (PF) SYRINGE
PREFILLED_SYRINGE | INTRAVENOUS | Status: DC | PRN
  Administered 2021-03-08 (×3): 50 mg via INTRAVENOUS

## 2021-03-08 MED ORDER — PANTOPRAZOLE SODIUM 40 MG PO TBEC
40.0000 mg | DELAYED_RELEASE_TABLET | Freq: Every day | ORAL | Status: DC
  Administered 2021-03-09: 40 mg via ORAL
  Filled 2021-03-08: qty 1

## 2021-03-08 MED ORDER — FENTANYL CITRATE (PF) 100 MCG/2ML IJ SOLN
INTRAMUSCULAR | Status: AC
  Filled 2021-03-08: qty 2

## 2021-03-08 MED ORDER — MORPHINE SULFATE (PF) 2 MG/ML IV SOLN
1.0000 mg | INTRAVENOUS | Status: DC | PRN
  Administered 2021-03-08: 2 mg via INTRAVENOUS
  Filled 2021-03-08: qty 1

## 2021-03-08 SURGICAL SUPPLY — 102 items
ADH SKN CLS APL DERMABOND .7 (GAUZE/BANDAGES/DRESSINGS) ×1
APL PRP STRL LF DISP 70% ISPRP (MISCELLANEOUS) ×2
BALLN DILATATION 4X8 (BALLOONS) ×1 IMPLANT
BLADE SURG 11 STRL SS (BLADE) ×2 IMPLANT
BUTTON OLYMPUS DEFENDO 5 PIECE (MISCELLANEOUS) ×2 IMPLANT
CANISTER SUCT 3000ML PPV (MISCELLANEOUS) ×4 IMPLANT
CANNULA REDUC XI 12-8 STAPL (CANNULA)
CANNULA REDUCER 12-8 DVNC XI (CANNULA) IMPLANT
CATH THORACIC 28FR (CATHETERS) ×2 IMPLANT
CHLORAPREP W/TINT 26 (MISCELLANEOUS) ×2 IMPLANT
CNTNR URN SCR LID CUP LEK RST (MISCELLANEOUS) ×1 IMPLANT
CONT SPEC 4OZ STRL OR WHT (MISCELLANEOUS) ×4
COVER BACK TABLE 60X90IN (DRAPES) ×2 IMPLANT
DECANTER SPIKE VIAL GLASS SM (MISCELLANEOUS) ×1 IMPLANT
DEFOGGER SCOPE WARMER CLEARIFY (MISCELLANEOUS) ×2 IMPLANT
DERMABOND ADVANCED (GAUZE/BANDAGES/DRESSINGS) ×1
DERMABOND ADVANCED .7 DNX12 (GAUZE/BANDAGES/DRESSINGS) ×2 IMPLANT
DEVICE SUTURE ENDOST 10MM (ENDOMECHANICALS) IMPLANT
DRAIN PENROSE 1/4X12 LTX STRL (WOUND CARE) ×2 IMPLANT
DRAPE COLUMN DVNC XI (DISPOSABLE) ×4 IMPLANT
DRAPE CV SPLIT W-CLR ANES SCRN (DRAPES) ×2 IMPLANT
DRAPE DA VINCI XI COLUMN (DISPOSABLE) ×8
DRAPE INCISE IOBAN 66X45 STRL (DRAPES) IMPLANT
DRAPE ORTHO SPLIT 77X108 STRL (DRAPES) ×2
DRAPE SURG ORHT 6 SPLT 77X108 (DRAPES) ×1 IMPLANT
DRAPE WARM FLUID 44X44 (DRAPES) ×2 IMPLANT
ELECT REM PT RETURN 9FT ADLT (ELECTROSURGICAL) ×2
ELECTRODE REM PT RTRN 9FT ADLT (ELECTROSURGICAL) ×1 IMPLANT
FELT TEFLON 1X6 (MISCELLANEOUS) IMPLANT
FORCEPS GRASP COMBO 8X230 (FORCEP) ×1 IMPLANT
GAUZE SPONGE 4X4 12PLY STRL (GAUZE/BANDAGES/DRESSINGS) ×2 IMPLANT
GLOVE BIO SURGEON STRL SZ7 (GLOVE) ×2 IMPLANT
GLOVE BIO SURGEON STRL SZ7.5 (GLOVE) ×2 IMPLANT
GLOVE SURG PR MICRO ENCORE 7.5 (GLOVE) ×3 IMPLANT
GLOVE SURG UNDER POLY LF SZ6.5 (GLOVE) ×3 IMPLANT
GOWN STRL REUS W/ TWL LRG LVL3 (GOWN DISPOSABLE) ×1 IMPLANT
GOWN STRL REUS W/ TWL XL LVL3 (GOWN DISPOSABLE) ×2 IMPLANT
GOWN STRL REUS W/TWL 2XL LVL3 (GOWN DISPOSABLE) ×2 IMPLANT
GOWN STRL REUS W/TWL LRG LVL3 (GOWN DISPOSABLE) ×2
GOWN STRL REUS W/TWL XL LVL3 (GOWN DISPOSABLE) ×6
GRASPER SUT TROCAR 14GX15 (MISCELLANEOUS) IMPLANT
HEMOSTAT SURGICEL 2X14 (HEMOSTASIS) ×1 IMPLANT
IRRIGATION STRYKERFLOW (MISCELLANEOUS) ×1 IMPLANT
IRRIGATOR STRYKERFLOW (MISCELLANEOUS) ×2
IRRIGATOR SUCT 8 DISP DVNC XI (IRRIGATION / IRRIGATOR) IMPLANT
IRRIGATOR SUCTION 8MM XI DISP (IRRIGATION / IRRIGATOR) ×2
IV NS 1000ML (IV SOLUTION)
IV NS 1000ML BAXH (IV SOLUTION) IMPLANT
KIT BASIN OR (CUSTOM PROCEDURE TRAY) ×2 IMPLANT
KIT TURNOVER KIT B (KITS) ×2 IMPLANT
LIGASURE LAP MARYLAND 5MM 37CM (ELECTROSURGICAL) ×1 IMPLANT
MARKER SKIN DUAL TIP RULER LAB (MISCELLANEOUS) ×2 IMPLANT
NDL 18GX1X1/2 (RX/OR ONLY) (NEEDLE) IMPLANT
NDL SCLEROTHERAPY 23X2X240 (NEEDLE) IMPLANT
NEEDLE 18GX1X1/2 (RX/OR ONLY) (NEEDLE) IMPLANT
NEEDLE SCLEROTHERAPY 23X2X240 (NEEDLE) IMPLANT
NS IRRIG 1000ML POUR BTL (IV SOLUTION) ×4 IMPLANT
OBTURATOR OPTICAL STANDARD 8MM (TROCAR) ×2
OBTURATOR OPTICAL STND 8 DVNC (TROCAR) ×1
OBTURATOR OPTICALSTD 8 DVNC (TROCAR) ×1 IMPLANT
OIL SILICONE PENTAX (PARTS (SERVICE/REPAIRS)) IMPLANT
PACK CHEST (CUSTOM PROCEDURE TRAY) ×2 IMPLANT
PACK UNIVERSAL I (CUSTOM PROCEDURE TRAY) ×2 IMPLANT
PAD ARMBOARD 7.5X6 YLW CONV (MISCELLANEOUS) ×4 IMPLANT
PORT ACCESS TROCAR AIRSEAL 12 (TROCAR) IMPLANT
PORT ACCESS TROCAR AIRSEAL 5M (TROCAR) ×1
SEAL CANN UNIV 5-8 DVNC XI (MISCELLANEOUS) ×4 IMPLANT
SEAL XI 5MM-8MM UNIVERSAL (MISCELLANEOUS) ×8
SEALER SYNCHRO 8 IS4000 DV (MISCELLANEOUS)
SEALER SYNCHRO 8 IS4000 DVNC (MISCELLANEOUS) IMPLANT
SET IRRIG TUBING LAPAROSCOPIC (IRRIGATION / IRRIGATOR) IMPLANT
SET TRI-LUMEN FLTR TB AIRSEAL (TUBING) ×1 IMPLANT
SET TUBE SMOKE EVAC HIGH FLOW (TUBING) ×2 IMPLANT
SHEET MEDIUM DRAPE 40X70 STRL (DRAPES) ×2 IMPLANT
SOL ANTI FOG 6CC (MISCELLANEOUS) IMPLANT
SOLUTION ANTI FOG 6CC (MISCELLANEOUS)
STAPLER CANNULA SEAL DVNC XI (STAPLE) IMPLANT
STAPLER CANNULA SEAL XI (STAPLE)
STOPCOCK 4 WAY LG BORE MALE ST (IV SETS) ×2 IMPLANT
SUT ETHIBOND 0 36 GRN (SUTURE) ×4 IMPLANT
SUT SILK  1 MH (SUTURE)
SUT SILK 1 MH (SUTURE) ×1 IMPLANT
SUT SURGIDAC NAB ES-9 0 48 120 (SUTURE) IMPLANT
SUT VIC AB 3-0 SH 27 (SUTURE) ×4
SUT VIC AB 3-0 SH 27X BRD (SUTURE) IMPLANT
SUT VIC AB 3-0 X1 27 (SUTURE) ×2 IMPLANT
SUT VICRYL 0 UR6 27IN ABS (SUTURE) ×4 IMPLANT
SYR 10ML LL (SYRINGE) ×2 IMPLANT
SYR 20ML ECCENTRIC (SYRINGE) ×2 IMPLANT
SYR 30ML SLIP (SYRINGE) ×2 IMPLANT
SYR 50ML LL SCALE MARK (SYRINGE) ×2 IMPLANT
SYSTEM SAHARA CHEST DRAIN ATS (WOUND CARE) ×2 IMPLANT
TOWEL GREEN STERILE (TOWEL DISPOSABLE) ×2 IMPLANT
TOWEL GREEN STERILE FF (TOWEL DISPOSABLE) ×2 IMPLANT
TRAY FOLEY MTR SLVR 16FR STAT (SET/KITS/TRAYS/PACK) ×2 IMPLANT
TROCAR XCEL 12X100 BLDLESS (ENDOMECHANICALS) ×2 IMPLANT
TROCAR XCEL BLADELESS 5X75MML (TROCAR) ×2 IMPLANT
TROCAR XCEL NON-BLD 5MMX100MML (ENDOMECHANICALS) ×1 IMPLANT
TUBE CONNECTING 20X1/4 (TUBING) ×2 IMPLANT
TUBING ENDO SMARTCAP (MISCELLANEOUS) ×2 IMPLANT
TUBING EXTENTION W/L.L. (IV SETS) ×2 IMPLANT
WATER STERILE IRR 1000ML POUR (IV SOLUTION) ×4 IMPLANT

## 2021-03-08 NOTE — Transfer of Care (Signed)
Immediate Anesthesia Transfer of Care Note  Patient: [name]  Procedure(s) Performed: XI ROBOTIC ASSISTED LAPAROSCOPY WITH LYSIS OF ADHESIONS (N/A ) ESOPHAGOGASTRODUODENOSCOPY (EGD) (N/A )  Patient Location: PACU  Anesthesia Type:General  Level of Consciousness: drowsy  Airway & Oxygen Therapy: Patient Spontanous Breathing and Patient connected to nasal cannula oxygen  Post-op Assessment: Report given to RN, Post -op Vital signs reviewed and stable and Patient moving all extremities X 4  Post vital signs: Reviewed and stable  Last Vitals:  Vitals Value Taken Time  BP 145/65 03/08/21 1200  Temp    Pulse 65 03/08/21 1202  Resp 16 03/08/21 1202  SpO2 96 % 03/08/21 1202  Vitals shown include unvalidated device data.  Last Pain:  Vitals:   03/08/21 0610  TempSrc: Oral         Complications: No complications documented.

## 2021-03-08 NOTE — Brief Op Note (Signed)
03/08/2021  11:07 AM  PATIENT:  [name]  80 y.o. female  PRE-OPERATIVE DIAGNOSIS:  Recurrent hiatal hernia  POST-OPERATIVE DIAGNOSIS:  Recurrent hiatal hernia  PROCEDURE:  ESOPHAGOGASTRODUODENOSCOPY (EGD),  LYSIS OF ADHESIONS, ATTEMPTED XI ROBOTIC ASSISTED LAPAROSCOPY REDO HIATAL HERNIA REPAIR  SURGEON:  Surgeon(s) and Role:    Lightfoot, Lucile Crater, MD - Primary  PHYSICIAN ASSISTANT: Lars Pinks PA-C  ANESTHESIA:   general  EBL:  50 mL   BLOOD ADMINISTERED:none  DRAINS: none   LOCAL MEDICATIONS USED:  OTHER Exparel  COUNTS CORRECT:  YES  DICTATION: .Dragon Dictation  PLAN OF CARE: Admit for overnight observation  PATIENT DISPOSITION:  PACU - hemodynamically stable.   Delay start of Pharmacological VTE agent (>24hrs) due to surgical blood loss or risk of bleeding: yes

## 2021-03-08 NOTE — Discharge Instructions (Signed)

## 2021-03-08 NOTE — Interval H&P Note (Signed)
History and Physical Interval Note:  03/08/2021 7:25 AM  [name]  has presented today for surgery, with the diagnosis of Hiatal hernia.  The various methods of treatment have been discussed with the patient and family. After consideration of risks, benefits and other options for treatment, the patient has consented to  Procedure(s): XI ROBOTIC [place] (N/A) ESOPHAGOGASTRODUODENOSCOPY (EGD) (N/A) as a surgical intervention.  The patient's history has been reviewed, patient examined, no change in status, stable for surgery.  I have reviewed the patient's chart and labs.  Questions were answered to the patient's satisfaction.     Earline Stiner Bary Leriche

## 2021-03-08 NOTE — Op Note (Signed)
[address]       [place] [address]             [phone]        03/08/2021  Patient:  NEKA BISE Pre-Op Dx: Recurrent hiatal hernia   Gastroesophageal reflux disease with erosive esophagitis Post-op Dx: Same Procedure: - Esophagoscopy - Robotic assisted laparoscopy -Extensive lysis of adhesions   Surgeon and Role:      * Han Lysne, Lucile Crater, MD - Primary    *D. Tacy Dura, PA-C- assisting  Anesthesia  general EBL: 150 ml Blood Administration: None Specimen: None   Counts: correct   Indications: 80 year old female presents with a recurrent hiatal hernia.  She has undergone 2 previous surgeries for this problem, and there is a question about the use of mesh with the last repair.  On the review of her esophagoscopy she does have esophageal ulcers.  We discussed the risks and benefits of upper endoscopy, robotic assisted laparoscopy with lysis of adhesions, and redo hiatal hernia repair.  She is agreeable to proceed.   Findings: Extensive abdominal adhesions.  There was difficulty finding a site of entry.  We ultimately were able to access the abdomen 2 cm lateral to the umbilicus.  We spent an additional 45 minutes taking down abdominal adhesions so that we could place our robotic trochars.  We then spent 2-1/2 hours attempting to mobilize the hiatus.  The stomach was densely adherent to the liver.  There was no defined plane to access the hiatus.  Patient did become hypotensive during the course of the mobilization, so I elected to hold procedure.  Operative Technique: After the risks, benefits and alternatives were thoroughly discussed, the patient was brought to the operative theatre.  Anesthesia was induced, and the esophagoscope was passed through the oropharynx down to the stomach.  The scope was retroflexed and the hiatal hernia was clearly evident.  The scope was pulled back and the mucosal surface of the esophagus was visualized.    The  esophagus was patulous.  There was no's ulcers.  The scope was then parked at 25 cm from the incisors.  The patient was then prepped and draped in normal sterile fashion.  An appropriate surgical pause was performed, and pre-operative antibiotics were dosed accordingly.  I initially attempted to access the right lower quadrant using a 5 mm Optiview trocar.  This was unsuccessful due to adhesions.  I then attempted to place another 5 mm trocar in the right upper quadrant, there were also adhesions to the access.  I then placed a trocar left lower quadrant 2 cm lateral to the umbilicus, and was able to gain entry.  On laparoscopy there was no evident injury any enteric content.  We did use the LigaSure to take down abdominal wall adhesions so that we could place the trochars.  3 other robotic ports were placed to triangulate the hiatus.  Another 12 mm port was placed in place at the level of the umbilicus laterally for an assistant port and another 5 mm trocar was placed in the right lower quadrant for liver retractor.  The patient was then placed in steep reverse Trendelenburg and the liver was elevated to expose the esophageal hiatus.  And then the robot was docked.  The liver edge was densely adherent to the diaphragm and the stomach.  We spent approximately 2 and half hours attempting to mobilize the liver off of the stomach.  This was very challenging I  attempted to enter the lesser sac mobilizing the greater curve of the stomach.  I was able to get up to the hiatus but adhesions were also very dense at this point.  I then attempted to mobilize the lesser curve of the stomach, but I also ran into the area gets adhesions.  I then focused my attention back to the liver edge of the parenchymal injuries were unfortunately created while trying to mobilized it off of the stomach.  Bleeding was controlled with cautery and Surgicel.  The patient was slightly hypotensive at this point this we released the  pneumoperitoneum, and she recovered quite quickly.  Due to the patient's age and complexity of the case I did not want to create any further injuries to the liver thus I elected to abort the case.  The liver retractor was removed and all ports were removed under direct visualization.  The skin and soft tissue were closed with absorbable suture    The patient tolerated the procedure without any immediate complications, and was transferred to the PACU in stable condition.  Amarrion Pastorino Bary Leriche

## 2021-03-08 NOTE — Discharge Summary (Signed)
Physician Discharge Summary       [address]       [place] [address]             [phone]    Patient ID: [name] MRN: [id number] DOB/AGE: Apr 15, 1941 80 y.o.  Admit date: 03/08/2021 Discharge date: 03/09/2021  Admission Diagnoses: Recurrent hiatal hernia  Discharge Diagnoses:  1. S/p ESOPHAGOGASTRODUODENOSCOPY (EGD),  LYSIS OF ADHESIONS, ATTEMPTED XI ROBOTIC ASSISTED LAPAROSCOPY REDO HIATAL HERNIA REPAIR 2. History of the following: Allergy    . Anal or rectal pain    sometimes  . Anemia    hx of during pregnancy  . Anxiety   . Arthritis   . Asthma    hx of  . Bradycardia    " I KNOW I HAVE BRADYCARDIA ESPECIALLY WHEN I SLEEP"   . Cataract 2021   bilateral eyes  . Chronic back pain   . Degenerative joint disease    osteo  . Depression   . Diverticulosis 2003  . Dysrhythmia    hx of  due to eye drop and also low heart rate 40's per pt. Dr. Gwenlyn Found follows  . Elevated total protein   . Esophageal dysmotility   . Fibromyalgia   . GERD (gastroesophageal reflux disease)    subsequent Nissen Fundoplication  . Glaucoma   . Hearing loss   . Heart murmur    "was told she had a heart murmur"  . Hemorrhoids   . Hiatal hernia 11/08/09  . Hx of adenomatous colonic polyps 07/02/02  . Hypercalcemia   . Hyperlipidemia   .    [name] Hypertension   . Nausea   . Osteoporosis   . PONV (postoperative nausea and vomiting)   . Rectal bleeding    from hemorrhoids.    . Sleep apnea    DOES USE CPAP   . Thrombocytopenia ([place])   . Varicose veins of left lower extremity     Consults: None  Procedure (s):  ESOPHAGOGASTRODUODENOSCOPY (EGD),  LYSIS OF ADHESIONS, ATTEMPTED XI ROBOTIC ASSISTED LAPAROSCOPY REDO HIATAL HERNIA REPAIR by Dr. Kipp Brood on 03/08/2021.  History of Presenting Illness: This is a 80 y.o. female referred by Dr.Nandigam for surgical evaluation twice recurrent hiatal hernia.  She originally underwent  a Nissen fundoplication in 5366 with revisions in 2009.  Is unclear what occurred in 2009 but the patient states that multiple adhesions were lysed which was placed between her stomach.  Unfortunately, there is no op reports to review this.  Regards to her symptoms she endorses worsening reflux as well as a chronic cough.  She has some dysphagia with meat.  She is on 2 medications for her acid reflux but continues to have some symptoms.  On the review of her esophagoscopy, she does have esophageal ulcers.  We discussed the risks and benefits of upper endoscopy, robotic assisted laparoscopy with lysis of adhesions, and redo hiatal hernia repair.  She is agreeable to proceed. She presented to [facility] on 05/25 in order to undergo robotic assisted redo hiatal hernia repair along with esophagogastroduodenoscopy.  Brief Hospital Course:  She has remained afebrile and hemodynamically stable. She had no nausea or vomiting. Swallow study done post op day one showed no evidence of contrast leak.  There was a suggestion of intraperitoneal gas on expected following robotic assisted surgery.  There was a small hiatal hernia again identified and spontaneous gastroesophageal reflux into the mid esophagus was observed. She was given a diet, which she tolerated.  All wounds are clean, dry and healing without signs of infection. She is felt surgically stable for discharge today.   Latest Vital Signs: Blood pressure (!) 130/54, pulse 61, temperature 97.6 F (36.4 C), temperature source Oral, resp. rate 15, height 5' 10.5" (1.791 m), weight 97.3 kg, SpO2 95 %.  Physical Exam:  Cardiovascular: RRR Pulmonary: Clear to auscultation bilaterally Abdomen: Soft, some incisional tenderness, bowel sounds present. Extremities: No lower extremity edema. Wounds: Clean and dry.  No erythema or signs of infection.  Discharge Condition: Stable and discharged to home.   Recent laboratory studies:  Lab Results  Component  Value Date   WBC 6.2 03/09/2021   HGB 12.3 03/09/2021   HCT 37.6 03/09/2021   MCV 95.9 03/09/2021   PLT 149 (L) 03/09/2021   Lab Results  Component Value Date   NA 136 03/09/2021   K 4.2 03/09/2021   CL 103 03/09/2021   CO2 25 03/09/2021   CREATININE 1.07 (H) 03/09/2021   GLUCOSE 123 (H) 03/09/2021     Diagnostic Studies: DG Chest 2 View  Result Date: 03/06/2021 CLINICAL DATA:  80 year old female with preoperative study for hernia repair EXAM: CHEST - 2 VIEW COMPARISON:  06/26/2018 FINDINGS: Cardiomediastinal silhouette unchanged in size and contour. No evidence of central vascular congestion. No interlobular septal thickening. No pneumothorax or pleural effusion. Coarsened interstitial markings, with no confluent airspace disease. No acute displaced fracture. Scoliotic curvature persists. Degenerative changes of the spine. Kyphotic curvature on the lateral view. IMPRESSION: Chronic lung changes without definite evidence of acute cardiopulmonary disease Electronically Signed   By: Corrie Mckusick D.O.   On: 03/06/2021 11:16   DG Chest Port 1 View  Result Date: 03/08/2021 CLINICAL DATA:  History of esophageal anastomotic leak. Pneumothorax. EXAM: PORTABLE CHEST 1 VIEW COMPARISON:  03/06/2021 FINDINGS: Single view of the chest demonstrates increased densities in the left lower chest. Overall, slightly low lung volumes. Atherosclerotic calcifications at the aortic arch. No focal right lung disease. No evidence for pneumothorax. IMPRESSION: New densities at the left lung base. Findings could be related to atelectasis but cannot exclude developing consolidation and/or pleural fluid. This could be better characterized with PA and lateral views of the chest. No evidence for pneumothorax. Electronically Signed   By: Markus Daft M.D.   On: 03/08/2021 14:09   Discharge Medications: Allergies as of 03/09/2021      Reactions   Adalat [nifedipine] Other (See Comments)   Unknown   Hydrocodone Itching,  Nausea And Vomiting   Meperidine Hcl Other (See Comments)   Demerol causes hallucinations   Naproxen Nausea And Vomiting      Medication List    TAKE these medications   brimonidine 0.15 % ophthalmic solution Commonly known as: ALPHAGAN Place 1 drop into both eyes in the morning and at bedtime.   cetirizine 10 MG tablet Commonly known as: ZYRTEC Take 10 mg by mouth at bedtime.   dexlansoprazole 60 MG capsule Commonly known as: DEXILANT Take 60 mg by mouth daily.   FLUoxetine 40 MG capsule Commonly known as: PROZAC Take 40 mg by mouth daily.   losartan 50 MG tablet Commonly known as: COZAAR Take 50 mg by mouth daily.   montelukast 10 MG tablet Commonly known as: SINGULAIR Take 10 mg by mouth at bedtime.   nystatin ointment Commonly known as: MYCOSTATIN Apply 1 application topically 2 (two) times daily as needed (for rash).   nystatin powder Commonly known as: MYCOSTATIN/NYSTOP Apply 1 g topically daily as needed (for  rash).   ondansetron 4 MG tablet Commonly known as: ZOFRAN Take 1 tablet (4 mg total) by mouth every 8 (eight) hours as needed for nausea or vomiting.   prednisoLONE acetate 1 % ophthalmic suspension Commonly known as: PRED FORTE Place 1 drop into the left eye 4 (four) times daily.   rosuvastatin 40 MG tablet Commonly known as: CRESTOR Take 40 mg by mouth at bedtime.   sucralfate 1 g tablet Commonly known as: CARAFATE TAKE 1 TABLET (1 G TOTAL) BY MOUTH 4 (FOUR) TIMES DAILY - WITH MEALS AND AT BEDTIME.   SYSTANE OP Place 1 drop into both eyes daily as needed (dry/irritated eyes).   traMADol 50 MG tablet Commonly known as: ULTRAM Take 1 tablet (50 mg total) by mouth every 6 (six) hours as needed for severe pain. What changed:   when to take this  reasons to take this   Vitamin D3 250 MCG (10000 UT) capsule Take 10,000 Units by mouth daily.       Follow Up Appointments:  Follow-up Information    Lajuana Matte, MD Follow up  on 03/17/2021.   Specialty: Cardiothoracic Surgery Why: Appointment is VIRTUAL. Please do NOT go to the office. Dr. Kipp Brood will call you at appointment time, which is 3:20 pm Contact information: [place] [address] [phone]               Signed: Sharalyn Ink [facility] 03/09/2021, 7:31 AM

## 2021-03-08 NOTE — Anesthesia Postprocedure Evaluation (Signed)
Anesthesia Post Note  Patient: [name]  Procedure(s) Performed: XI ROBOTIC ASSISTED LAPAROSCOPY WITH LYSIS OF ADHESIONS (N/A ) ESOPHAGOGASTRODUODENOSCOPY (EGD) (N/A )     Patient location during evaluation: PACU Anesthesia Type: General Level of consciousness: awake Pain management: pain level controlled Vital Signs Assessment: post-procedure vital signs reviewed and stable Respiratory status: spontaneous breathing, nonlabored ventilation, respiratory function stable and patient connected to nasal cannula oxygen Cardiovascular status: blood pressure returned to baseline and stable Postop Assessment: no apparent nausea or vomiting Anesthetic complications: no   No complications documented.  Last Vitals:  Vitals:   03/08/21 1600 03/08/21 1647  BP:  (!) 167/70  Pulse: (!) 50 (!) 54  Resp: 12 17  Temp:    SpO2: 93% 93%    Last Pain:  Vitals:   03/08/21 1647  TempSrc:   PainSc: 0-No pain                 Aminta Sakurai P Miasia Crabtree

## 2021-03-08 NOTE — Plan of Care (Signed)

## 2021-03-08 NOTE — Anesthesia Procedure Notes (Signed)
Procedure Name: Intubation Date/Time: 03/08/2021 7:42 AM Performed by: Kyung Rudd, CRNA Pre-anesthesia Checklist: Patient identified, Emergency Drugs available, Suction available and Patient being monitored Patient Re-evaluated:Patient Re-evaluated prior to induction Oxygen Delivery Method: Circle system utilized Preoxygenation: Pre-oxygenation with 100% oxygen Induction Type: IV induction Ventilation: Mask ventilation without difficulty Laryngoscope Size: Miller and 2 Grade View: Grade I Tube type: Oral Tube size: 7.0 mm Number of attempts: 1 Airway Equipment and Method: Stylet Placement Confirmation: ETT inserted through vocal cords under direct vision,  positive ETCO2,  CO2 detector and breath sounds checked- equal and bilateral Secured at: 21 cm Tube secured with: Tape Dental Injury: Teeth and Oropharynx as per pre-operative assessment  Comments: Performed by Carleene Cooper SRNA

## 2021-03-09 ENCOUNTER — Inpatient Hospital Stay (HOSPITAL_COMMUNITY): Payer: Medicare Other

## 2021-03-09 ENCOUNTER — Encounter (HOSPITAL_COMMUNITY): Payer: Self-pay | Admitting: Thoracic Surgery (Cardiothoracic Vascular Surgery)

## 2021-03-09 LAB — CBC
HCT: 37.6 % (ref 36.0–46.0)
Hemoglobin: 12.3 g/dL (ref 12.0–15.0)
MCH: 31.4 pg (ref 26.0–34.0)
MCHC: 32.7 g/dL (ref 30.0–36.0)
MCV: 95.9 fL (ref 80.0–100.0)
Platelets: 149 10*3/uL — ABNORMAL LOW (ref 150–400)
RBC: 3.92 MIL/uL (ref 3.87–5.11)
RDW: 12.8 % (ref 11.5–15.5)
WBC: 6.2 10*3/uL (ref 4.0–10.5)
nRBC: 0 % (ref 0.0–0.2)

## 2021-03-09 LAB — BASIC METABOLIC PANEL
Anion gap: 8 (ref 5–15)
BUN: 14 mg/dL (ref 8–23)
CO2: 25 mmol/L (ref 22–32)
Calcium: 8.7 mg/dL — ABNORMAL LOW (ref 8.9–10.3)
Chloride: 103 mmol/L (ref 98–111)
Creatinine, Ser: 1.07 mg/dL — ABNORMAL HIGH (ref 0.44–1.00)
GFR, Estimated: 53 mL/min — ABNORMAL LOW (ref >60)
Glucose, Bld: 123 mg/dL — ABNORMAL HIGH (ref 70–99)
Potassium: 4.2 mmol/L (ref 3.5–5.1)
Sodium: 136 mmol/L (ref 135–145)

## 2021-03-09 LAB — GLUCOSE, CAPILLARY
Glucose-Capillary: 101 mg/dL — ABNORMAL HIGH (ref 70–99)
Glucose-Capillary: 113 mg/dL — ABNORMAL HIGH (ref 70–99)

## 2021-03-09 MED ORDER — TRAMADOL HCL 50 MG PO TABS: 50.0000 mg | ORAL_TABLET | Freq: Four times a day (QID) | ORAL | 0 refills | Status: DC | PRN

## 2021-03-09 MED ORDER — IOHEXOL 300 MG/ML  SOLN
50.0000 mL | Freq: Once | INTRAMUSCULAR | Status: AC | PRN
  Administered 2021-03-09: 50 mL via ORAL

## 2021-03-09 NOTE — Progress Notes (Signed)
Tolerated diet well. PA Roddenberry made aware, ordered to dc patient >home.

## 2021-03-09 NOTE — Progress Notes (Signed)
Mobility Specialist - Progress Note   03/09/21 1108  Mobility  Activity Ambulated in hall  Level of Assistance Independent after set-up  Assistive Device Cane  Distance Ambulated (ft) 220 ft  Mobility Ambulated with assistance in hallway  Mobility Response Tolerated well  Mobility performed by Mobility specialist  $Mobility charge 1 Mobility   Pre-mobility: 64 HR During mobility: 71 HR Post-mobility: 66 HR  Pt able to rock and stand w/o assist. Distance limited by fatigue. Pt left sitting up on edge of bed after walk, call bell at side.   Pricilla Handler Mobility Specialist Mobility Specialist Phone: [phone]

## 2021-03-09 NOTE — Plan of Care (Signed)
  Problem: Education: Goal: Knowledge of General Education information will improve Description: Including pain rating scale, medication(s)/side effects and non-pharmacologic comfort measures Outcome: Progressing   Problem: Health Behavior/Discharge Planning: Goal: Ability to manage health-related needs will improve Outcome: Progressing   Problem: Activity: Goal: Risk for activity intolerance will decrease Outcome: Progressing   Problem: Pain Managment: Goal: General experience of comfort will improve Outcome: Progressing   Problem: Elimination: Goal: Will not experience complications related to urinary retention Outcome: Not Applicable

## 2021-03-09 NOTE — Progress Notes (Signed)
      [address]       [place] [address]             [phone]       1 Day Post-Op Procedure(s) (LRB): XI ROBOTIC ASSISTED LAPAROSCOPY WITH LYSIS OF ADHESIONS (N/A) ESOPHAGOGASTRODUODENOSCOPY (EGD) (N/A)  Subjective: Patient denies nausea. She has had some incisional pain. She inquired if we saw mesh (from previous surgery) near her liver, and we did not see mesh there.  Objective: Vital signs in last 24 hours: Temp:  [97.4 F (36.3 C)-98.4 F (36.9 C)] 97.6 F (36.4 C) (05/26 0300) Pulse Rate:  [48-71] 61 (05/26 0400) Cardiac Rhythm: Sinus bradycardia (05/26 0300) Resp:  [10-19] 15 (05/26 0400) BP: (111-182)/(51-74) 130/54 (05/26 0400) SpO2:  [92 %-100 %] 95 % (05/26 0400)      Intake/Output from previous day: 05/25 0701 - 05/26 0700 In: 1600 [I.V.:1400; IV Piggyback:200] Out: 1400 [Urine:1350; Blood:50]   Physical Exam:  Cardiovascular: RRR Pulmonary: Clear to auscultation bilaterally Abdomen: Soft, some incisional tenderness, bowel sounds present. Extremities: No lower extremity edema. Wounds: Clean and dry.  No erythema or signs of infection.   Lab Results: UDJ:SHFWYO Labs    03/06/21 1038 03/09/21 0126  WBC 4.6 6.2  HGB 12.8 12.3  HCT 39.9 37.6  PLT 155 149*   BMET:  Recent Labs    03/06/21 1038 03/09/21 0126  NA 138 136  K 4.0 4.2  CL 105 103  CO2 25 25  GLUCOSE 105* 123*  BUN 17 14  CREATININE 1.05* 1.07*  CALCIUM 9.0 8.7*    PT/INR:  Recent Labs    03/06/21 1038  LABPROT 13.8  INR 1.1   ABG:  INR: Will add last result for INR, ABG once components are confirmed Will add last 4 CBG results once components are confirmed  Assessment/Plan:  1. CV - SR/SB. 2.  Pulmonary - On room air.  3. CBGs 127/113/101. No history of DM so will stop accu checks and SS PRN 4. GI-NPO. Await swallow study 5. If swallow study shows no leak, allow oral intake and possibly discharge home later today  [facility]  ZimmermanPA-C 03/09/2021,6:57 AM [phone]

## 2021-03-16 DIAGNOSIS — H2000 Unspecified acute and subacute iridocyclitis: Secondary | ICD-10-CM | POA: Diagnosis not present

## 2021-03-16 DIAGNOSIS — E113212 Type 2 diabetes mellitus with mild nonproliferative diabetic retinopathy with macular edema, left eye: Secondary | ICD-10-CM | POA: Diagnosis not present

## 2021-03-17 ENCOUNTER — Other Ambulatory Visit: Payer: Self-pay

## 2021-03-17 ENCOUNTER — Telehealth (INDEPENDENT_AMBULATORY_CARE_PROVIDER_SITE_OTHER): Payer: Self-pay | Admitting: Thoracic Surgery (Cardiothoracic Vascular Surgery)

## 2021-03-17 DIAGNOSIS — K449 Diaphragmatic hernia without obstruction or gangrene: Secondary | ICD-10-CM

## 2021-03-17 NOTE — Progress Notes (Signed)
     [address]       [place] [address]             [phone]       Patient: Home Provider: Office Consent for Telemedicine visit obtained.  Today's visit was completed via a real-time telehealth (see specific modality noted below). The patient/authorized person provided oral consent at the time of the visit to engage in a telemedicine encounter with the present provider at [facility]. The patient/authorized person was informed of the potential benefits, limitations, and risks of telemedicine. The patient/authorized person expressed understanding that the laws that protect confidentiality also apply to telemedicine. The patient/authorized person acknowledged understanding that telemedicine does not provide emergency services and that he or she would need to call 911 or proceed to the nearest hospital for help if such a need arose.  . Total time spent in the clinical discussion 10 minutes. . Telehealth Modality: Phone visit (audio only)  I had a telephone visit with [name].  She states that she is doing well.  She is not having much incisional pain.  There is some slight discomfort.  We again discussed the events during surgery, and I explained to her that the adhesions were far too dense to perform this operation safely.  I further explained that trying to approach this from the chest would be a very big operation as well.  She is scheduled to meet with Dr. Silverio Decamp, with the goal of symptom control.  She will follow-up with me in 8month to look at her incisions.

## 2021-03-28 ENCOUNTER — Other Ambulatory Visit: Payer: Medicare Other

## 2021-03-28 DIAGNOSIS — E113291 Type 2 diabetes mellitus with mild nonproliferative diabetic retinopathy without macular edema, right eye: Secondary | ICD-10-CM | POA: Diagnosis not present

## 2021-03-28 DIAGNOSIS — H43393 Other vitreous opacities, bilateral: Secondary | ICD-10-CM | POA: Diagnosis not present

## 2021-03-28 DIAGNOSIS — H43823 Vitreomacular adhesion, bilateral: Secondary | ICD-10-CM | POA: Diagnosis not present

## 2021-03-28 DIAGNOSIS — E113212 Type 2 diabetes mellitus with mild nonproliferative diabetic retinopathy with macular edema, left eye: Secondary | ICD-10-CM | POA: Diagnosis not present

## 2021-04-04 ENCOUNTER — Telehealth: Payer: Self-pay

## 2021-04-04 DIAGNOSIS — K649 Unspecified hemorrhoids: Secondary | ICD-10-CM

## 2021-04-04 MED ORDER — HYDROCORTISONE ACETATE 25 MG RE SUPP
RECTAL | 1 refills | Status: DC
Start: 2021-04-04 — End: 2021-11-13

## 2021-04-04 NOTE — Telephone Encounter (Signed)
Spoke with patient, informed patient of rec's per MD. Patient also de aware that Anusol suppository will be sent to verified pharmacy. Pt advised to contact our office if medication is not covered by insurance and we will switch her to preparation H. Pt verbalized understanding and nothing further needed at this time.

## 2021-04-04 NOTE — Telephone Encounter (Signed)
Please advise small meals with low fat and low carb diet, maintain hydration. If having persistent diarrhea, will need to check stool GI path panel. Please bring her in office visit with APP, can switch if we have an earlier cancellation. Thanks

## 2021-04-04 NOTE — Telephone Encounter (Signed)
Spoke with the daughter of the patient. Patient has had her surgery and was discharged from the hospital 03/09/21. She has had a follow up with Dr Kipp Brood. She will go back to him in July.  Patient has developed diarrhea. Daughter does not know how much diarrhea. The mother tells her she has had it for 1 to 2 days. She has abdominal pain. Regular diet. Not on antibiotics. Feels nauseous. Afebrile. Taking Carafate and Dexilant.  There are no appointments with the APPs until late July. You are into September.

## 2021-04-04 NOTE — Telephone Encounter (Signed)
Please send Rx for anusol suppositories at bedtime daily as needed X12. If not covered by insurance she can use  preparation H suppositories along with Anusol cream (please send Rx) as needed twice daily. Thanks

## 2021-04-04 NOTE — Telephone Encounter (Signed)
Called and spoke with patient. Made patient aware of rec's. Pt verbalized understand. Pt also stated that her hemorrhoids are flaring up and painful. Pt requesting something to help alleviate her symptoms.  Please advise   Pt already scheduled for appt with Tye Savoy May 03, 2021 @9am 

## 2021-04-07 DIAGNOSIS — H2000 Unspecified acute and subacute iridocyclitis: Secondary | ICD-10-CM | POA: Diagnosis not present

## 2021-04-07 DIAGNOSIS — H35372 Puckering of macula, left eye: Secondary | ICD-10-CM | POA: Diagnosis not present

## 2021-04-21 ENCOUNTER — Ambulatory Visit (INDEPENDENT_AMBULATORY_CARE_PROVIDER_SITE_OTHER): Payer: Self-pay | Admitting: Thoracic Surgery (Cardiothoracic Vascular Surgery)

## 2021-04-21 ENCOUNTER — Ambulatory Visit: Payer: Medicare Other | Admitting: Thoracic Surgery (Cardiothoracic Vascular Surgery)

## 2021-04-21 ENCOUNTER — Other Ambulatory Visit: Payer: Self-pay

## 2021-04-21 VITALS — BP 114/65 | HR 67 | Resp 20 | Ht 70.0 in | Wt 211.0 lb

## 2021-04-21 DIAGNOSIS — Z09 Encounter for follow-up examination after completed treatment for conditions other than malignant neoplasm: Secondary | ICD-10-CM

## 2021-04-21 DIAGNOSIS — K449 Diaphragmatic hernia without obstruction or gangrene: Secondary | ICD-10-CM

## 2021-04-21 NOTE — Progress Notes (Signed)
      [address]       [place] [address]             [name] #[id number] Date of Birth: [birth year]  Referring: Mauri Pole, MD Primary Care: Fanny Bien, MD Primary Cardiologist:Jonathan Gwenlyn Found, MD  Reason for visit:   follow-up  History of Present Illness:     [name] comes in for 1 month follow-up appointment.  She has had some left upper quadrant incisional pain.  There has been a stitch that has come out slightly.  She continues to have the dysphagia as well as reflux symptoms.  Physical Exam: BP 114/65   Pulse 67   Resp 20   Ht 5\' 10"  (1.778 m)   Wt 211 lb (95.7 kg)   SpO2 94%   BMI 30.28 kg/m   Alert NAD Incision clean, one stitch was removed.   Abdomen soft, ND No peripheral edema       Assessment / Plan:   80 year old female status post aborted robotic assisted laparoscopy repair of 2 times recurrent hiatal hernia.  She is scheduled to meet with gastroenterology later this month.  The ideal plan would be to treat her symptoms medically.   Lajuana Matte 04/21/2021 5:10 PM

## 2021-05-01 DIAGNOSIS — Z0000 Encounter for general adult medical examination without abnormal findings: Secondary | ICD-10-CM | POA: Diagnosis not present

## 2021-05-01 DIAGNOSIS — Z1331 Encounter for screening for depression: Secondary | ICD-10-CM | POA: Diagnosis not present

## 2021-05-01 DIAGNOSIS — Z1339 Encounter for screening examination for other mental health and behavioral disorders: Secondary | ICD-10-CM | POA: Diagnosis not present

## 2021-05-02 DIAGNOSIS — H43823 Vitreomacular adhesion, bilateral: Secondary | ICD-10-CM | POA: Diagnosis not present

## 2021-05-02 DIAGNOSIS — H43393 Other vitreous opacities, bilateral: Secondary | ICD-10-CM | POA: Diagnosis not present

## 2021-05-02 DIAGNOSIS — E113291 Type 2 diabetes mellitus with mild nonproliferative diabetic retinopathy without macular edema, right eye: Secondary | ICD-10-CM | POA: Diagnosis not present

## 2021-05-02 DIAGNOSIS — E113212 Type 2 diabetes mellitus with mild nonproliferative diabetic retinopathy with macular edema, left eye: Secondary | ICD-10-CM | POA: Diagnosis not present

## 2021-05-03 ENCOUNTER — Encounter: Payer: Self-pay | Admitting: Nurse Practitioner

## 2021-05-03 ENCOUNTER — Ambulatory Visit (INDEPENDENT_AMBULATORY_CARE_PROVIDER_SITE_OTHER): Payer: Medicare Other | Admitting: Nurse Practitioner

## 2021-05-03 VITALS — BP 130/70 | HR 64 | Ht 70.0 in | Wt 212.0 lb

## 2021-05-03 DIAGNOSIS — K219 Gastro-esophageal reflux disease without esophagitis: Secondary | ICD-10-CM | POA: Diagnosis not present

## 2021-05-03 NOTE — Patient Instructions (Addendum)
If you are age 79 or older, your body mass index should be between 23-30. Your Body mass index is 30.42 kg/m. If this is out of the aforementioned range listed, please consider follow up with your Primary Care Provider.  The [facility] providers would like to encourage you to use [facility] to communicate with providers for non-urgent requests or questions.  Due to long hold times on the telephone, sending your provider a message by [facility] may be faster and more efficient way to get a response. Please allow 48 business hours for a response.  Please remember that this is for non-urgent requests/questions.  Please decrease the Carafate to twice a day as needed. Continue Dexilant.    It was great seeing you today! Thank you for entrusting me with your care and choosing [facility].  Tye Savoy, NP

## 2021-05-03 NOTE — Progress Notes (Signed)
ASSESSMENT AND PLAN    # 80 yo female with GERD / hiatal hernia s/p post Nissen fundoplication 7654 and revision in 2009. Now with recurrent hiatal hernia.  In May 2022 Cardiothoracic Surgery attempted another hiatal hernia repair but significant adhesive disease precluded ability to safely perform the surgery. Intraoperative EGD  showed a hiatal hernia and patulous esophagus. No  evidence of persistent esophageal ulcers ( seen on EGD in March 2022). Going forward patient's GERD will be managed medically. She feels her symptoms are fairly well controlled on Dexilant and carafate QID.  --Patient appears to have developed CKD based on labs over the last year. Based on this will reduce Carafate to BID as needed for pyrosis --Continue daily Dexilant --Anti-reflux measures discussed --Call if recurrent / worsening GERD symptoms.   # Gastric ulcers ( superficial) on EGD in March 2022). Slight chronic inflammation on biopsies. No dysplasia.    # Rectal bleeding. Most likely hemorrhoidal and resolved with course of Anusol. She is up to date on colon cancer screening ( last one in 2019).   HISTORY OF PRESENT ILLNESS    Chief Complaint : follow up after seeing surgery about hiatal hernia repair  [name] is a 80 y.o. female with a past medical history of hypertension, hyperlipidemia, glaucoma , adenomatous colon polyps, diverticulosis , GERD, hiatal hernia status post Nissen fundoplication 6503 and revision in 2009 with recurrent hiatal hernia hernia . See PMH below for any additional medical problems.    Patient was last seen in January of this year for evaluation of breakthrough heartburn and coughing.  For evaluation she underwent EGD with findings of esophageal and gastric ulcers, a 5 cm HH, Nissen fundoplication (wrap appeared not to be intact) .She was referred for surgical evaluation.   In late May patient was taken to the OR by Dr. Lowella Dell. She underwent laparoscopy with LOA. Because  of very dense adhesions the hiatal hernia repair could not be safely performed.  Patient is actually doing well from a GERD standpoint.  She says she isn't having any swallowing problems other than occasionally regurgitating water. No significant heartburn and only occasional coughing. She feels like switching to Dexilant has really made a difference.  She is also taking Carafate 4 times a day. She goes to bed on an empty stomach. She doesn't consume beverages with caffeine.   [name] offers that she has been having intermittent chest discomfort which is random , not necessarily related to eating. The discomfort can radiate into her throat sometimes. She gets these episodes about once a week and the discomfort is not what she feels with acid reflux. She thinks stress is a contributing factor. She had an episode yesterday while getting an injection in her eye.   Patient called our office in late June for rectal bleeding and diarrhea. The diarrhea was unusual for her.  We called in Anusol which she used for 10 days with total resolution of rectal bleeding. Bowel movements are back to normal.    PREVIOUS ENDOSCOPIC EVALUATIONS / PERTINENT STUDIES:   GI History per 11/09/20 office note:   Colonoscopy September 30, 2018: 5 mm [tubular adenoma] polyp removed from transverse colon, diverticulosis and hemorrhoids.  No recall recommended.   She has had multiple upper GI series which show moderate size hiatal hernia and evidence of reflux. Esophageal manometry showed normal esophageal motility with good bolus clearance. Per patient she had a pH study done prior to her first fundoplication. It  is not clear why patient had a revision in 2009, surgical report not available in epic but per patient she had a lot of scar tissue and had difficulty with the revision and she couldn't have the complete Nissen fundoplication at the time  March 2022 EGD -Dilation in the lower third of the esophagus. - Esophageal ulcers.  Biopsied. - 5 cm hiatal hernia. - Gastric ulcers. Biopsied. - A Nissen fundoplication was found. The wrap appears not intact. - Normal examined duodenum.  Surgical [P], esophagus, GE junction - GASTRIC MUCOSA WITH SLIGHT CHRONIC INFLAMMATION. - NO INTESTINAL METAPLASIA, DYSPLASIA, OR CARCINOMA.   Past Medical History:  Diagnosis Date   Allergy    Anal or rectal pain    sometimes   Anemia    hx of during pregnancy   Anxiety    Arthritis    Asthma    hx of   Bradycardia    " I KNOW I HAVE BRADYCARDIA ESPECIALLY WHEN I SLEEP"    Cataract 2021   bilateral eyes   Chronic back pain    Degenerative joint disease    osteo   Depression    Diabetes mellitus without complication ([place])    DM type II   Diverticulosis 2003   Dysrhythmia    hx of  due to eye drop and also low heart rate 40's per pt. Dr. Gwenlyn Found follows   Elevated total protein    Esophageal dysmotility    Fibromyalgia    GERD (gastroesophageal reflux disease)    subsequent Nissen Fundoplication   Glaucoma    Hearing loss    Heart murmur    "was told she had a heart murmur"   Hemorrhoids    Hiatal hernia 11/08/09   Hx of adenomatous colonic polyps 07/02/02   Hypercalcemia    Hyperlipidemia    Hyperlipidemia    Hypertension    Nausea    Osteoporosis    PONV (postoperative nausea and vomiting)    Rectal bleeding    from hemorrhoids.     Sleep apnea    DOES USE CPAP    Thrombocytopenia (HCC)    Varicose veins of left lower extremity     Current Medications, Allergies, Past Surgical History, Family History and Social History were reviewed in [employer] record.   Current Outpatient Medications  Medication Sig Dispense Refill   brimonidine (ALPHAGAN) 0.15 % ophthalmic solution Place 1 drop into both eyes in the morning and at bedtime.     cetirizine (ZYRTEC) 10 MG tablet Take 10 mg by mouth at bedtime.     Cholecalciferol (VITAMIN D3) 250 MCG (10000 UT) capsule Take 10,000 Units by  mouth daily.     dexlansoprazole (DEXILANT) 60 MG capsule Take 60 mg by mouth daily.     FLUoxetine (PROZAC) 40 MG capsule Take 40 mg by mouth daily.     hydrocortisone (ANUSOL-HC) 25 MG suppository One suppository at Bedtime as needed x12 12 suppository 1   losartan (COZAAR) 50 MG tablet Take 50 mg by mouth daily.      montelukast (SINGULAIR) 10 MG tablet Take 10 mg by mouth at bedtime.     nystatin (MYCOSTATIN/NYSTOP) powder Apply 1 g topically daily as needed (for rash).   3   nystatin ointment (MYCOSTATIN) Apply 1 application topically 2 (two) times daily as needed (for rash).   0   ondansetron (ZOFRAN) 4 MG tablet Take 1 tablet (4 mg total) by mouth every 8 (eight) hours as needed for nausea  or vomiting. 60 tablet 1   Polyethyl Glycol-Propyl Glycol (SYSTANE OP) Place 1 drop into both eyes daily as needed (dry/irritated eyes).     rosuvastatin (CRESTOR) 40 MG tablet Take 40 mg by mouth at bedtime.     sucralfate (CARAFATE) 1 g tablet TAKE 1 TABLET (1 G TOTAL) BY MOUTH 4 (FOUR) TIMES DAILY - WITH MEALS AND AT BEDTIME. 120 tablet 2   No current facility-administered medications for this visit.    Review of Systems: No chest pain. No shortness of breath. No urinary complaints.   PHYSICAL EXAM :    Wt Readings from Last 3 Encounters:  05/03/21 212 lb (96.2 kg)  04/21/21 211 lb (95.7 kg)  03/08/21 214 lb 9 oz (97.3 kg)    BP 130/70   Pulse 64   Ht 5\' 10"  (1.778 m)   Wt 212 lb (96.2 kg)   BMI 30.42 kg/m  Constitutional:  Pleasant female in no acute distress. Psychiatric: Normal mood and affect. Behavior is normal. EENT: Pupils normal.  Conjunctivae are normal. No scleral icterus. Neck supple.  Cardiovascular: Normal rate, regular rhythm. No edema Pulmonary/chest: Effort normal and breath sounds normal. No wheezing, rales or rhonchi. Abdominal: Soft, nondistended, nontender. Bowel sounds active throughout. There are no masses palpable. No hepatomegaly. Neurological: Alert and  oriented to person place and time. Skin: Skin is warm and dry. No rashes noted.  Tye Savoy, NP  05/03/2021, 9:17 AM

## 2021-05-06 ENCOUNTER — Encounter: Payer: Self-pay | Admitting: Gastroenterology

## 2021-05-09 DIAGNOSIS — E1165 Type 2 diabetes mellitus with hyperglycemia: Secondary | ICD-10-CM | POA: Diagnosis not present

## 2021-05-09 DIAGNOSIS — E782 Mixed hyperlipidemia: Secondary | ICD-10-CM | POA: Diagnosis not present

## 2021-05-09 DIAGNOSIS — I10 Essential (primary) hypertension: Secondary | ICD-10-CM | POA: Diagnosis not present

## 2021-05-11 DIAGNOSIS — E1165 Type 2 diabetes mellitus with hyperglycemia: Secondary | ICD-10-CM | POA: Diagnosis not present

## 2021-05-11 DIAGNOSIS — I10 Essential (primary) hypertension: Secondary | ICD-10-CM | POA: Diagnosis not present

## 2021-05-11 DIAGNOSIS — E1169 Type 2 diabetes mellitus with other specified complication: Secondary | ICD-10-CM | POA: Diagnosis not present

## 2021-05-11 DIAGNOSIS — N289 Disorder of kidney and ureter, unspecified: Secondary | ICD-10-CM | POA: Diagnosis not present

## 2021-05-11 DIAGNOSIS — E782 Mixed hyperlipidemia: Secondary | ICD-10-CM | POA: Diagnosis not present

## 2021-05-11 DIAGNOSIS — Z7185 Encounter for immunization safety counseling: Secondary | ICD-10-CM | POA: Diagnosis not present

## 2021-05-11 DIAGNOSIS — Z23 Encounter for immunization: Secondary | ICD-10-CM | POA: Diagnosis not present

## 2021-06-06 DIAGNOSIS — E113212 Type 2 diabetes mellitus with mild nonproliferative diabetic retinopathy with macular edema, left eye: Secondary | ICD-10-CM | POA: Diagnosis not present

## 2021-06-06 DIAGNOSIS — H43823 Vitreomacular adhesion, bilateral: Secondary | ICD-10-CM | POA: Diagnosis not present

## 2021-06-06 DIAGNOSIS — H43393 Other vitreous opacities, bilateral: Secondary | ICD-10-CM | POA: Diagnosis not present

## 2021-06-06 DIAGNOSIS — E113291 Type 2 diabetes mellitus with mild nonproliferative diabetic retinopathy without macular edema, right eye: Secondary | ICD-10-CM | POA: Diagnosis not present

## 2021-06-12 NOTE — Progress Notes (Signed)
Reviewed and agree with documentation and assessment and plan. K. Veena Ladonte Verstraete , MD   

## 2021-07-10 DIAGNOSIS — H401132 Primary open-angle glaucoma, bilateral, moderate stage: Secondary | ICD-10-CM | POA: Diagnosis not present

## 2021-07-10 DIAGNOSIS — H3581 Retinal edema: Secondary | ICD-10-CM | POA: Diagnosis not present

## 2021-07-10 DIAGNOSIS — E113212 Type 2 diabetes mellitus with mild nonproliferative diabetic retinopathy with macular edema, left eye: Secondary | ICD-10-CM | POA: Diagnosis not present

## 2021-07-12 DIAGNOSIS — H43393 Other vitreous opacities, bilateral: Secondary | ICD-10-CM | POA: Diagnosis not present

## 2021-07-12 DIAGNOSIS — E113212 Type 2 diabetes mellitus with mild nonproliferative diabetic retinopathy with macular edema, left eye: Secondary | ICD-10-CM | POA: Diagnosis not present

## 2021-07-12 DIAGNOSIS — H43823 Vitreomacular adhesion, bilateral: Secondary | ICD-10-CM | POA: Diagnosis not present

## 2021-07-12 DIAGNOSIS — E113291 Type 2 diabetes mellitus with mild nonproliferative diabetic retinopathy without macular edema, right eye: Secondary | ICD-10-CM | POA: Diagnosis not present

## 2021-07-18 DIAGNOSIS — Z23 Encounter for immunization: Secondary | ICD-10-CM | POA: Diagnosis not present

## 2021-08-03 ENCOUNTER — Other Ambulatory Visit: Payer: Self-pay

## 2021-08-03 DIAGNOSIS — I10 Essential (primary) hypertension: Secondary | ICD-10-CM | POA: Diagnosis not present

## 2021-08-03 DIAGNOSIS — G4700 Insomnia, unspecified: Secondary | ICD-10-CM | POA: Diagnosis not present

## 2021-08-03 DIAGNOSIS — M170 Bilateral primary osteoarthritis of knee: Secondary | ICD-10-CM | POA: Diagnosis not present

## 2021-08-03 DIAGNOSIS — M479 Spondylosis, unspecified: Secondary | ICD-10-CM | POA: Diagnosis not present

## 2021-08-03 DIAGNOSIS — F331 Major depressive disorder, recurrent, moderate: Secondary | ICD-10-CM | POA: Diagnosis not present

## 2021-08-03 DIAGNOSIS — M169 Osteoarthritis of hip, unspecified: Secondary | ICD-10-CM | POA: Diagnosis not present

## 2021-08-03 DIAGNOSIS — F411 Generalized anxiety disorder: Secondary | ICD-10-CM | POA: Diagnosis not present

## 2021-08-11 DIAGNOSIS — H43823 Vitreomacular adhesion, bilateral: Secondary | ICD-10-CM | POA: Diagnosis not present

## 2021-08-11 DIAGNOSIS — H43393 Other vitreous opacities, bilateral: Secondary | ICD-10-CM | POA: Diagnosis not present

## 2021-08-11 DIAGNOSIS — E113213 Type 2 diabetes mellitus with mild nonproliferative diabetic retinopathy with macular edema, bilateral: Secondary | ICD-10-CM | POA: Diagnosis not present

## 2021-08-11 DIAGNOSIS — H401133 Primary open-angle glaucoma, bilateral, severe stage: Secondary | ICD-10-CM | POA: Diagnosis not present

## 2021-08-25 ENCOUNTER — Other Ambulatory Visit: Payer: Self-pay | Admitting: *Deleted

## 2021-08-25 NOTE — Patient Outreach (Signed)
[place] [facility]) Care Management  08/25/2021  ODESTER NILSON 1941/05/28 [id number]   [facility] Unsuccessful outreach  Mrs LORIELLE BOEHNING was referred to [facility]  on 08/03/21 after she completed EMMI engagement tool with [facility] CMA. Her score was 9. She was referred for further screening  Insurance Medicare    Outreach attempt to the listed at the preferred outreach number in [place]  No answer. [facility] RN CM left HIPAA [facility] Portability and Accountability Act) compliant voicemail message along with CM's contact info.   Plan: [facility] RN CM scheduled this patient for another call attempt within 4-7 business days Unsuccessful outreach letter sent on 08/25/21  Unsuccessful outreach on 08/25/21   Joelene Millin L. Lavina Hamman, RN, BSN, [place] Coordinator Office number [phone] Mobile number [phone]  Main [facility] number [phone] Fax number [phone]

## 2021-08-30 ENCOUNTER — Other Ambulatory Visit: Payer: Self-pay | Admitting: *Deleted

## 2021-08-30 NOTE — Patient Outreach (Signed)
[place] [facility]) Care Management  08/30/2021  SHALISA MCQUADE [birth year] [id number]   [facility] second Unsuccessful outreach   Mrs AYANNI TUN was referred to [facility]  on 08/03/21 after she completed EMMI engagement tool with [facility] CMA. Her score was 9. She was referred for further screening   Insurance Medicare       Outreach attempt to the listed at the preferred outreach number in [place]  No answer. [facility] RN CM left HIPAA [facility] Portability and Accountability Act) compliant voicemail message along with CM's contact info.    Plan: [facility] RN CM scheduled this patient for another call attempt within 4-7 business days Unsuccessful outreach letter sent on 08/25/21  Unsuccessful outreach on 08/25/21, 08/30/21     Joelene Millin L. Lavina Hamman, RN, BSN, [place] Coordinator Office number [phone] Mobile number [phone]  Main [facility] number [phone] Fax number [phone]

## 2021-09-05 DIAGNOSIS — H3581 Retinal edema: Secondary | ICD-10-CM | POA: Diagnosis not present

## 2021-09-05 DIAGNOSIS — H401113 Primary open-angle glaucoma, right eye, severe stage: Secondary | ICD-10-CM | POA: Diagnosis not present

## 2021-09-05 DIAGNOSIS — H35372 Puckering of macula, left eye: Secondary | ICD-10-CM | POA: Diagnosis not present

## 2021-09-06 ENCOUNTER — Other Ambulatory Visit: Payer: Self-pay | Admitting: *Deleted

## 2021-09-06 NOTE — Patient Outreach (Signed)
[place] [facility]) Care Management  09/06/2021  PEYTIN DECHERT 1941-06-05 [id number]   [facility] third Unsuccessful outreach   Mrs KIAIRA POINTER was referred to [facility]  on 08/03/21 after she completed EMMI engagement tool with [facility] CMA. Her score was 9. She was referred for further screening   Insurance Medicare       Outreach attempt to the listed at the preferred outreach number in [place]  No answer. [facility] RN CM left HIPAA [facility] Portability and Accountability Act) compliant voicemail message along with CM's contact info.    Plan: [facility] RN CM scheduled this patient for another call attempt within 21 business days and pend for case closure Unsuccessful outreach letter sent on 08/25/21  Unsuccessful outreach on 08/25/21, 08/30/21, 09/06/21     Jonella Redditt L. Lavina Hamman, RN, BSN, [place] Coordinator Office number [phone] Mobile number [phone]  Main [facility] number [phone] Fax number [phone]

## 2021-09-26 DIAGNOSIS — H401133 Primary open-angle glaucoma, bilateral, severe stage: Secondary | ICD-10-CM | POA: Diagnosis not present

## 2021-09-26 DIAGNOSIS — E113212 Type 2 diabetes mellitus with mild nonproliferative diabetic retinopathy with macular edema, left eye: Secondary | ICD-10-CM | POA: Diagnosis not present

## 2021-09-26 DIAGNOSIS — H43823 Vitreomacular adhesion, bilateral: Secondary | ICD-10-CM | POA: Diagnosis not present

## 2021-09-26 DIAGNOSIS — E113291 Type 2 diabetes mellitus with mild nonproliferative diabetic retinopathy without macular edema, right eye: Secondary | ICD-10-CM | POA: Diagnosis not present

## 2021-09-27 ENCOUNTER — Other Ambulatory Visit: Payer: Self-pay | Admitting: *Deleted

## 2021-09-27 NOTE — Patient Outreach (Signed)
[place] [facility]) Care Management  09/27/2021  [name] 10-08-1941 [id number]   [facility] Case closure   [name] was referred to [facility]  on 08/03/21 after she completed EMMI engagement tool with [facility] CMA. Her score was 9. She was referred for further screening   Insurance Medicare     Unsuccessful outreach letter sent on 08/25/21  Unsuccessful outreach on 08/25/21, 08/30/21, 09/06/21  without a response   Plan [facility] RN CM will close case after no response from patient within 10+ business days. Unable to reach Case closure letters sent to patient and MD  Cheryl Millin L. Lavina Hamman, RN, BSN, [place] Coordinator Office number [phone] Mobile number [phone]  Main [facility] number [phone] Fax number [phone]

## 2021-10-23 DIAGNOSIS — R1111 Vomiting without nausea: Secondary | ICD-10-CM | POA: Diagnosis not present

## 2021-10-23 DIAGNOSIS — R112 Nausea with vomiting, unspecified: Secondary | ICD-10-CM | POA: Diagnosis not present

## 2021-10-23 DIAGNOSIS — R231 Pallor: Secondary | ICD-10-CM | POA: Diagnosis not present

## 2021-10-23 DIAGNOSIS — I959 Hypotension, unspecified: Secondary | ICD-10-CM | POA: Diagnosis not present

## 2021-10-23 DIAGNOSIS — R110 Nausea: Secondary | ICD-10-CM | POA: Diagnosis not present

## 2021-10-26 ENCOUNTER — Encounter (HOSPITAL_BASED_OUTPATIENT_CLINIC_OR_DEPARTMENT_OTHER): Payer: Self-pay | Admitting: Emergency Medicine

## 2021-10-26 ENCOUNTER — Other Ambulatory Visit: Payer: Self-pay

## 2021-10-26 ENCOUNTER — Emergency Department (HOSPITAL_BASED_OUTPATIENT_CLINIC_OR_DEPARTMENT_OTHER): Payer: Medicare Other

## 2021-10-26 ENCOUNTER — Emergency Department (HOSPITAL_BASED_OUTPATIENT_CLINIC_OR_DEPARTMENT_OTHER)
Admission: EM | Admit: 2021-10-26 | Discharge: 2021-10-26 | Disposition: A | Discharge status: Home / Self Care | Payer: Medicare Other | Attending: Emergency Medicine | Admitting: Emergency Medicine

## 2021-10-26 DIAGNOSIS — K449 Diaphragmatic hernia without obstruction or gangrene: Secondary | ICD-10-CM | POA: Diagnosis not present

## 2021-10-26 DIAGNOSIS — I10 Essential (primary) hypertension: Secondary | ICD-10-CM | POA: Diagnosis not present

## 2021-10-26 DIAGNOSIS — B349 Viral infection, unspecified: Secondary | ICD-10-CM

## 2021-10-26 DIAGNOSIS — R0600 Dyspnea, unspecified: Secondary | ICD-10-CM | POA: Diagnosis not present

## 2021-10-26 DIAGNOSIS — R109 Unspecified abdominal pain: Secondary | ICD-10-CM

## 2021-10-26 DIAGNOSIS — I517 Cardiomegaly: Secondary | ICD-10-CM | POA: Diagnosis not present

## 2021-10-26 DIAGNOSIS — Z20822 Contact with and (suspected) exposure to covid-19: Secondary | ICD-10-CM | POA: Diagnosis not present

## 2021-10-26 DIAGNOSIS — R55 Syncope and collapse: Secondary | ICD-10-CM

## 2021-10-26 DIAGNOSIS — R42 Dizziness and giddiness: Secondary | ICD-10-CM | POA: Diagnosis not present

## 2021-10-26 DIAGNOSIS — I700 Atherosclerosis of aorta: Secondary | ICD-10-CM | POA: Diagnosis not present

## 2021-10-26 DIAGNOSIS — R079 Chest pain, unspecified: Secondary | ICD-10-CM | POA: Diagnosis not present

## 2021-10-26 DIAGNOSIS — R1013 Epigastric pain: Secondary | ICD-10-CM | POA: Diagnosis not present

## 2021-10-26 LAB — URINALYSIS, ROUTINE W REFLEX MICROSCOPIC
Bilirubin Urine: NEGATIVE
Glucose, UA: NEGATIVE mg/dL
Hgb urine dipstick: NEGATIVE
Ketones, ur: NEGATIVE mg/dL
Nitrite: NEGATIVE
Protein, ur: NEGATIVE mg/dL
Specific Gravity, Urine: 1.044 — ABNORMAL HIGH (ref 1.005–1.030)
pH: 6.5 (ref 5.0–8.0)

## 2021-10-26 LAB — COMPREHENSIVE METABOLIC PANEL
ALT: 10 U/L (ref 0–44)
AST: 18 U/L (ref 15–41)
Albumin: 3.9 g/dL (ref 3.5–5.0)
Alkaline Phosphatase: 66 U/L (ref 38–126)
Anion gap: 9 (ref 5–15)
BUN: 13 mg/dL (ref 8–23)
CO2: 25 mmol/L (ref 22–32)
Calcium: 9 mg/dL (ref 8.9–10.3)
Chloride: 105 mmol/L (ref 98–111)
Creatinine, Ser: 1.14 mg/dL — ABNORMAL HIGH (ref 0.44–1.00)
GFR, Estimated: 49 mL/min — ABNORMAL LOW (ref >60)
Glucose, Bld: 130 mg/dL — ABNORMAL HIGH (ref 70–99)
Potassium: 3.5 mmol/L (ref 3.5–5.1)
Sodium: 139 mmol/L (ref 135–145)
Total Bilirubin: 0.5 mg/dL (ref 0.3–1.2)
Total Protein: 7.2 g/dL (ref 6.5–8.1)

## 2021-10-26 LAB — CBC
HCT: 41.4 % (ref 36.0–46.0)
Hemoglobin: 13.3 g/dL (ref 12.0–15.0)
MCH: 30.5 pg (ref 26.0–34.0)
MCHC: 32.1 g/dL (ref 30.0–36.0)
MCV: 95 fL (ref 80.0–100.0)
Platelets: 151 10*3/uL (ref 150–400)
RBC: 4.36 MIL/uL (ref 3.87–5.11)
RDW: 13.5 % (ref 11.5–15.5)
WBC: 4 10*3/uL (ref 4.0–10.5)
nRBC: 0 % (ref 0.0–0.2)

## 2021-10-26 LAB — RESP PANEL BY RT-PCR (FLU A&B, COVID) ARPGX2
Influenza A by PCR: NEGATIVE
Influenza B by PCR: NEGATIVE
SARS Coronavirus 2 by RT PCR: NEGATIVE

## 2021-10-26 LAB — DIFFERENTIAL
Abs Immature Granulocytes: 0 10*3/uL (ref 0.00–0.07)
Basophils Absolute: 0 10*3/uL (ref 0.0–0.1)
Basophils Relative: 1 %
Eosinophils Absolute: 0.2 10*3/uL (ref 0.0–0.5)
Eosinophils Relative: 5 %
Immature Granulocytes: 0 %
Lymphocytes Relative: 45 %
Lymphs Abs: 1.8 10*3/uL (ref 0.7–4.0)
Monocytes Absolute: 0.4 10*3/uL (ref 0.1–1.0)
Monocytes Relative: 10 %
Neutro Abs: 1.6 10*3/uL — ABNORMAL LOW (ref 1.7–7.7)
Neutrophils Relative %: 39 %

## 2021-10-26 LAB — BRAIN NATRIURETIC PEPTIDE: B Natriuretic Peptide: 57 pg/mL (ref 0.0–100.0)

## 2021-10-26 LAB — TROPONIN I (HIGH SENSITIVITY)
Troponin I (High Sensitivity): 9 ng/L (ref <18)
Troponin I (High Sensitivity): 10 ng/L (ref <18)

## 2021-10-26 LAB — D-DIMER, QUANTITATIVE: D-Dimer, Quant: 1.21 ug/mL-FEU — ABNORMAL HIGH (ref 0.00–0.50)

## 2021-10-26 LAB — LIPASE, BLOOD: Lipase: 13 U/L (ref 11–51)

## 2021-10-26 MED ORDER — ONDANSETRON HCL 4 MG/2ML IJ SOLN
4.0000 mg | Freq: Once | INTRAMUSCULAR | Status: AC
Start: 2021-10-26 — End: 2021-10-26
  Administered 2021-10-26: 4 mg via INTRAVENOUS
  Filled 2021-10-26: qty 2

## 2021-10-26 MED ORDER — IOHEXOL 350 MG/ML SOLN
80.0000 mL | Freq: Once | INTRAVENOUS | Status: AC | PRN
  Administered 2021-10-26: 80 mL via INTRAVENOUS

## 2021-10-26 MED ORDER — ONDANSETRON HCL 4 MG PO TABS: 4.0000 mg | ORAL_TABLET | ORAL | 0 refills | Status: DC | PRN

## 2021-10-26 MED ORDER — ALUM & MAG HYDROXIDE-SIMETH 200-200-20 MG/5ML PO SUSP
30.0000 mL | Freq: Once | ORAL | Status: AC
  Administered 2021-10-26: 30 mL via ORAL
  Filled 2021-10-26: qty 30

## 2021-10-26 MED ORDER — LIDOCAINE VISCOUS HCL 2 % MT SOLN
15.0000 mL | Freq: Once | OROMUCOSAL | Status: AC
  Administered 2021-10-26: 15 mL via ORAL
  Filled 2021-10-26: qty 15

## 2021-10-26 MED ORDER — FAMOTIDINE IN NACL 20-0.9 MG/50ML-% IV SOLN
20.0000 mg | Freq: Once | INTRAVENOUS | Status: AC
  Administered 2021-10-26: 20 mg via INTRAVENOUS
  Filled 2021-10-26: qty 50

## 2021-10-26 MED ORDER — SODIUM CHLORIDE 0.9 % IV BOLUS
1000.0000 mL | Freq: Once | INTRAVENOUS | Status: AC
  Administered 2021-10-26: 1000 mL via INTRAVENOUS

## 2021-10-26 MED ORDER — KETOROLAC TROMETHAMINE 15 MG/ML IJ SOLN
15.0000 mg | Freq: Once | INTRAMUSCULAR | Status: AC
  Administered 2021-10-26: 15 mg via INTRAVENOUS
  Filled 2021-10-26: qty 1

## 2021-10-26 MED ORDER — SUCRALFATE 1 G PO TABS: 1.0000 g | ORAL_TABLET | Freq: Three times a day (TID) | ORAL | 0 refills | Status: DC

## 2021-10-26 NOTE — ED Triage Notes (Signed)
Abd pain since Monday , some nausea  , states she passed out on Monday but  thought it was the stomach virus because  family members  had it

## 2021-10-26 NOTE — ED Provider Notes (Signed)
[facility] EMER[facility]ENC[facility] DEPT Provider Note   CSN: [id number] Arrival date & time: 10/26/21  1536     History  Chief Complaint  Patient presents with   Abdominal Pain    [name] is a 81 y.o. female.  This is a 81 y.o. female with significant medical history as below, including hypertension, RA, exertional dyspnea who presents to the ED with complaint of abdominal pain, presyncope.  Reports onset of symptoms approximately 4 to 5 days ago.  Been having discomfort to her mid epigastric, substernal region described as a tightness, pressure.  Poor appetite over the past few days with subsequently reduced oral intake.  Mild nausea without vomiting.  No change in bowel or bladder function.  No fevers.  Does report chills a few days ago but none at this time.  No recent travel or sick contacts.  Patient reports on Monday she was preparing lunch and she felt lightheaded, diaphoretic, tunnel vision, presyncopal.  She sat down in a chair and symptoms improved.  Since then she has been having recurrence of symptoms when she stands up from a seated or lying down position.  Symptoms will resolved with sitting down.  The symptoms have not occurred in the past.  No numbness or tingling to 1 side of the body.  No dyspnea, no current chest pain.    The history is provided by the patient. No language interpreter was used.  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    Scoliosis of lumbar spine 02/04/2018   Bradycardia 07/27/2016   Cough    Essential hypertension 11/24/2014   Gonalgia 05/13/2013   Cellulitis 05/13/2013   OA (osteoarthritis) of knee 05/13/2013   Degenerative joint disease involving multiple joints 01/19/2013   Rheumatoid arthritis ([place]) 01/19/2013   Thyroid nodule 06/11/2012   Thrombocytopenia (HCC)    Elevated total protein    Hypercalcemia    Cyst of thyroid 10/29/2011   Hemorrhoids, internal 05/28/2011   Acid reflux 02/14/2011   BP (high blood pressure) 02/14/2011   Avitaminosis D 02/13/2011   Glaucoma 11/28/2010   HLD (hyperlipidemia) 12/14/2009   Internal and external bleeding hemorrhoids 12/14/2009   DIVERTICULOSIS, COLON 12/14/2009   Joint pain 12/14/2009   BACK PAIN, CHRONIC 12/14/2009   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 Authorizing Provider  ondansetron (ZOFRAN) 4 MG tablet Take 1 tablet (4 mg total) by mouth every 4 (four) hours as needed for nausea or vomiting. 10/26/21  Yes Wynona Dove A, DO  sucralfate (CARAFATE) 1 g tablet Take 1 tablet (  1 g total) by mouth 4 (four) times daily -  with meals and at bedtime for 7 days. 10/26/21 11/02/21 Yes Wynona Dove A, DO  brimonidine (ALPHAGAN) 0.15 % ophthalmic solution Place 1 drop into both eyes in the morning and at bedtime.    [provider]  cetirizine (ZYRTEC) 10 MG tablet Take 10 mg by mouth at bedtime.    [provider]  Cholecalciferol (VITAMIN D3) 250 MCG (10000 UT) capsule Take 10,000 Units by mouth daily.    [provider]  dexlansoprazole (DEXILANT) 60 MG  capsule Take 60 mg by mouth daily.    [provider]  FLUoxetine (PROZAC) 40 MG capsule Take 40 mg by mouth daily. 12/10/19   [provider]  hydrocortisone (ANUSOL-HC) 25 MG suppository One suppository at Bedtime as needed x12 04/04/21   Nandigam, Venia Minks, MD  losartan (COZAAR) 50 MG tablet Take 50 mg by mouth daily.     [provider]  montelukast (SINGULAIR) 10 MG tablet Take 10 mg by mouth at bedtime.    [provider]  nystatin (MYCOSTATIN/NYSTOP) powder Apply 1 g topically daily as needed (for rash).  11/20/16   [provider]  nystatin ointment (MYCOSTATIN) Apply 1 application topically 2 (two) times daily as needed (for rash).  11/20/16   [provider]  ondansetron (ZOFRAN) 4 MG tablet Take 1 tablet (4 mg total) by mouth every 8 (eight) hours as needed for nausea or vomiting. 07/30/17   Mauri Pole, MD  Polyethyl Glycol-Propyl Glycol (SYSTANE OP) Place 1 drop into both eyes daily as needed (dry/irritated eyes).    [provider]  rosuvastatin (CRESTOR) 40 MG tablet Take 40 mg by mouth at bedtime.    [provider]  sucralfate (CARAFATE) 1 g tablet TAKE 1 TABLET (1 G TOTAL) BY MOUTH 4 (FOUR) TIMES DAILY - WITH MEALS AND AT BEDTIME. 02/21/21   Mauri Pole, MD      Allergies    Adalat [nifedipine], Hydrocodone, Meperidine hcl, and Naproxen    Review of Systems   Review of Systems  Constitutional:  Positive for chills. Negative for fever.  HENT:  Negative for facial swelling and trouble swallowing.   Eyes:  Negative for photophobia and visual disturbance.  Respiratory:  Negative for cough and shortness of breath.   Cardiovascular:  Negative for chest pain and palpitations.  Gastrointestinal:  Positive for abdominal pain and nausea. Negative for vomiting.  Endocrine: Negative for polydipsia and polyuria.  Genitourinary:  Negative for difficulty urinating and hematuria.  Musculoskeletal:  Negative  for gait problem and joint swelling.  Skin:  Negative for pallor and rash.  Neurological:  Positive for light-headedness. Negative for syncope and headaches.       Presyncope  Psychiatric/Behavioral:  Negative for agitation and confusion.    Physical Exam Updated Vital Signs BP (!) 165/64    Pulse (!) 57    Temp 98.7 F (37.1 C)    Resp 11    Ht 5\' 10"  (1.778 m)    Wt 90.7 kg    SpO2 98%    BMI 28.70 kg/m  Physical Exam Vitals and nursing note reviewed.  Constitutional:      General: She is not in acute distress.    Appearance: Normal appearance. She is well-developed. She is not ill-appearing, toxic-appearing or diaphoretic.  HENT:     Head: Normocephalic and atraumatic.     Right Ear: External ear normal.     Left Ear:  External ear normal.     Nose: Nose normal.     Mouth/Throat:     Mouth: Mucous membranes are moist.  Eyes:     General: No scleral icterus.       Right eye: No discharge.        Left eye: No discharge.     Extraocular Movements: Extraocular movements intact.     Pupils: Pupils are equal, round, and reactive to light.  Cardiovascular:     Rate and Rhythm: Normal rate and regular rhythm.     Pulses: Normal pulses.     Heart sounds: Normal heart sounds.  Pulmonary:     Effort: Pulmonary effort is normal. No respiratory distress.     Breath sounds: Normal breath sounds.  Abdominal:     General: Abdomen is flat. There is no distension.     Palpations: Abdomen is soft.     Tenderness: There is abdominal tenderness in the epigastric area. There is no guarding or rebound.  Musculoskeletal:        General: Normal range of motion.     Cervical back: Normal range of motion.     Right lower leg: No edema.     Left lower leg: No edema.  Skin:    General: Skin is warm and dry.     Capillary Refill: Capillary refill takes less than 2 seconds.  Neurological:     Mental Status: She is alert and oriented to person, place, and time.     GCS: GCS eye subscore is 4. GCS  verbal subscore is 5. GCS motor subscore is 6.     Cranial Nerves: Cranial nerves 2-12 are intact.     Sensory: Sensation is intact.     Motor: Motor function is intact.     Coordination: Coordination is intact.     Gait: Gait is intact.  Psychiatric:        Mood and Affect: Mood normal.        Behavior: Behavior normal.    ED Results / Procedures / Treatments   Labs (all labs ordered are listed, but only abnormal results are displayed) Labs Reviewed  COMPREHENSIVE METABOLIC PANEL - Abnormal; Notable for the following components:      Result Value   Glucose, Bld 130 (*)    Creatinine, Ser 1.14 (*)    GFR, Estimated 49 (*)    All other components within normal limits  URINALYSIS, ROUTINE W REFLEX MICROSCOPIC - Abnormal; Notable for the following components:   Specific Gravity, Urine 1.044 (*)    Leukocytes,Ua TRACE (*)    All other components within normal limits  D-DIMER, QUANTITATIVE - Abnormal; Notable for the following components:   D-Dimer, Quant 1.21 (*)    All other components within normal limits  DIFFERENTIAL - Abnormal; Notable for the following components:   Neutro Abs 1.6 (*)    All other components within normal limits  RESP PANEL BY RT-PCR (FLU A&B, COVID) ARPGX2  LIPASE, BLOOD  CBC  BRAIN NATRIURETIC PEPTIDE  TROPONIN I (HIGH SENSITIVITY)  TROPONIN I (HIGH SENSITIVITY)    EKG EKG Interpretation  Date/Time:  Thursday October 26 2021 17:05:32 EST Ventricular Rate:  52 PR Interval:  173 QRS Duration: 102 QT Interval:  428 QTC Calculation: 398 R Axis:   14 Text Interpretation: Sinus rhythm Left ventricular hypertrophy Similar to prior tracing Confirmed by Wynona Dove ([id number]) on 10/26/2021 5:39:38 PM  Radiology CT Head Wo Contrast  Result Date: 10/26/2021 CLINICAL DATA:  Syncope  on Monday. EXAM: CT HEAD WITHOUT CONTRAST TECHNIQUE: Contiguous axial images were obtained from the base of the skull through the vertex without intravenous contrast. RADIATION  DOSE REDUCTION: This exam was performed according to the departmental dose-optimization program which includes automated exposure control, adjustment of the mA and/or kV according to patient size and/or use of iterative reconstruction technique. COMPARISON:  None. FINDINGS: Brain: No intracranial hemorrhage, mass effect, or midline shift. No hydrocephalus. The basilar cisterns are patent. No evidence of territorial infarct or acute ischemia. No extra-axial or intracranial fluid collection. Vascular: Atherosclerosis of skullbase vasculature without hyperdense vessel or abnormal calcification. Skull: No fracture or focal lesion. Sinuses/Orbits: Mild mucosal thickening of ethmoid air cells. No sinus fluid levels. Bilateral lens extraction. Occasional opacification of left mastoid air cells. Other: None. IMPRESSION: No acute intracranial abnormality. Electronically Signed   By: Keith Rake M.D.   On: 10/26/2021 18:53   CT Angio Chest PE W and/or Wo Contrast  Result Date: 10/26/2021 CLINICAL DATA:  Pulmonary embolism suspected. Abnormal D-dimer. Abdominal pain beginning several days ago. EXAM: CT ANGIOGRAPHY CHEST WITH CONTRAST TECHNIQUE: Multidetector CT imaging of the chest was performed using the standard protocol during bolus administration of intravenous contrast. Multiplanar CT image reconstructions and MIPs were obtained to evaluate the vascular anatomy. RADIATION DOSE REDUCTION: This exam was performed according to the departmental dose-optimization program which includes automated exposure control, adjustment of the mA and/or kV according to patient size and/or use of iterative reconstruction technique. CONTRAST:  41mL OMNIPAQUE IOHEXOL 350 MG/ML SOLN COMPARISON:  07/24/2020 FINDINGS: Cardiovascular: Cardiomegaly. No pericardial effusion. No visible coronary artery calcification. Minimal aortic atherosclerotic calcification. Pulmonary arterial opacification is excellent. There are no pulmonary emboli.  Mediastinum/Nodes: No mass or adenopathy.  Hiatal hernia is present. Lungs/Pleura: No pleural effusion. No infiltrate, collapse or mass lesion. Minimal scarring at the lung bases. Upper Abdomen: Negative Musculoskeletal: Ordinary mild thoracic degenerative changes. Review of the MIP images confirms the above findings. IMPRESSION: No pulmonary emboli or other acute chest pathology. Cardiomegaly. Electronically Signed   By: Nelson Chimes M.D.   On: 10/26/2021 18:56   CT ABDOMEN PELVIS W CONTRAST  Result Date: 10/26/2021 CLINICAL DATA:  Abdominal pain, acute, nonlocalized. EXAM: CT ABDOMEN AND PELVIS WITH CONTRAST TECHNIQUE: Multidetector CT imaging of the abdomen and pelvis was performed using the standard protocol following bolus administration of intravenous contrast. CONTRAST:  29mL OMNIPAQUE IOHEXOL 350 MG/ML SOLN COMPARISON:  08/01/2017 FINDINGS: Lower chest: Some scarring at the lung bases. Hepatobiliary: Few tiny liver cysts. No significant liver finding. No calcified gallstones. Pancreas: Normal Spleen: Normal Adrenals/Urinary Tract: Adrenal glands are normal. Kidneys are normal. Right kidney shows congenital incomplete rotation. Bladder is normal. Stomach/Bowel: Hiatal hernia is present. Previous gastric surgery. Small bowel is normal. Appendix is normal. There is diverticulosis of the colon without visible diverticulitis. Vascular/Lymphatic: Aortic atherosclerosis. No aneurysm. IVC is normal. No adenopathy. Reproductive: No pelvic mass. Other: No free fluid or air. Musculoskeletal: Mild lumbar degenerative changes. IMPRESSION: No acute finding to explain the clinical presentation. No evidence of solid organ pathology or acute bowel pathology. Hiatal hernia.  Previous gastric surgery. Diverticulosis without visible diverticulitis. Electronically Signed   By: Nelson Chimes M.D.   On: 10/26/2021 18:54    Procedures Procedures    Medications Ordered in ED Medications  iohexol (OMNIPAQUE) 350 MG/ML  injection 80 mL (80 mLs Intravenous Contrast Given 10/26/21 1826)  sodium chloride 0.9 % bolus 1,000 mL (0 mLs Intravenous Stopped 10/26/21 2019)  famotidine (PEPCID) IVPB 20 mg  premix (0 mg Intravenous Stopped 10/26/21 2019)  alum & mag hydroxide-simeth (MAALOX/MYLANTA) 200-200-20 MG/5ML suspension 30 mL (30 mLs Oral Given 10/26/21 1915)    And  lidocaine (XYLOCAINE) 2 % viscous mouth solution 15 mL (15 mLs Oral Given 10/26/21 1915)  ondansetron (ZOFRAN) injection 4 mg (4 mg Intravenous Given 10/26/21 1915)  ketorolac (TORADOL) 15 MG/ML injection 15 mg (15 mg Intravenous Given 10/26/21 1915)    ED Course/ Medical Decision Making/ A&P                           Medical Decision Making   CC: Abdominal pain, presyncope  This patient complains of above; this involves an extensive number of treatment options and is a complaint that carries with it a high risk of complications and morbidity. Vital signs were reviewed. Serious etiologies considered.  Record review:  Previous records obtained and reviewed   Additional history obtained from family at bedside  Work up as above, notable for:  Labs & imaging results that were available during my care of the patient were reviewed by me and considered in my medical decision making.   Low risk Wells score, D-dimer is elevated.  Will obtain CT PE.  EKG reviewed by myself, no evidence of STEMI.  Cardiac monitoring reviewed by myself, sinus bradycardia  I ordered imaging studies which included CT head, CT chest, CT abdomen pelvis and I independently visualized and interpreted imaging which showed, hiatal hernia, cardiomegaly, no acute process  Management: Given GI cocktail, analgesics, IV fluids  Reassessment:  Patient ports her symptoms are greatly improved, she feels back to her baseline.  She is asking for something to eat.  HEAR score is low, EKG with evidence acute ischemia, troponin negative.  She has no chest pain.  ACS is  unlikely.  Symptoms today likely secondary to viral syndrome  [facility] syncope rule: Low risk.   Discussed close a patient follow-up and supportive care at home.  Strict return precautions were discussed.  Patient presents with syncopal symptoms without worrisome features. Presentation most suggestive of neuro-cardiogenic or orthostatic cause. Very low suspicion for serious arrhythmia, cardiac ischemia or other serious etiology. ECG reviewed, no evidence of a cardiac arrhythmia such as Brugada, WPW, HOCM, IHSS, Long or short QT. Neurologic exam is nonfocal, not consistent with CVA or primary neurologic abnormality. Patient appears safe for discharge with outpatient observation and close PCP F/U. Syncope warnings discussed with patient. The patient has been instructed to return immediately if the symptoms worsen in any way.    The patient's overall condition has improved, the patient presents with abdominal pain without signs of peritonitis, or other life-threatening serious etiology. The patient understands that at this time there is no evidence for a more malignant underlying process, but the patient also understands that early in the process of an illness, an emergency department workup can be falsely reassuring. Detailed discussions were had with the patient regarding current findings, and need for close f/u with PCP or on call doctor. The patient appears stable for discharge and has been instructed to return immediately if the symptoms worsen in any way for re-evaluation. Patient verbalized understanding and is in agreement with current care plan.  All questions answered prior to discharge.      This chart was dictated using voice recognition software.  Despite best efforts to proofread,  errors can occur which can change the documentation meaning.         Final  Clinical Impression(s) / ED Diagnoses Final diagnoses:  Abdominal pain, unspecified abdominal location  Viral syndrome   Near syncope    Rx / DC Orders ED Discharge Orders          Ordered    sucralfate (CARAFATE) 1 g tablet  3 times daily with meals & bedtime        10/26/21 2211    ondansetron (ZOFRAN) 4 MG tablet  Every 4 hours PRN        10/26/21 2211              Jeanell Sparrow, DO 10/26/21 2211

## 2021-10-26 NOTE — Discharge Instructions (Addendum)

## 2021-10-31 DIAGNOSIS — E113291 Type 2 diabetes mellitus with mild nonproliferative diabetic retinopathy without macular edema, right eye: Secondary | ICD-10-CM | POA: Diagnosis not present

## 2021-10-31 DIAGNOSIS — H43393 Other vitreous opacities, bilateral: Secondary | ICD-10-CM | POA: Diagnosis not present

## 2021-10-31 DIAGNOSIS — E113212 Type 2 diabetes mellitus with mild nonproliferative diabetic retinopathy with macular edema, left eye: Secondary | ICD-10-CM | POA: Diagnosis not present

## 2021-10-31 DIAGNOSIS — H43823 Vitreomacular adhesion, bilateral: Secondary | ICD-10-CM | POA: Diagnosis not present

## 2021-11-10 ENCOUNTER — Telehealth: Payer: Self-pay | Admitting: Cardiovascular Disease

## 2021-11-10 DIAGNOSIS — K449 Diaphragmatic hernia without obstruction or gangrene: Secondary | ICD-10-CM | POA: Diagnosis not present

## 2021-11-10 DIAGNOSIS — E782 Mixed hyperlipidemia: Secondary | ICD-10-CM | POA: Diagnosis not present

## 2021-11-10 DIAGNOSIS — I10 Essential (primary) hypertension: Secondary | ICD-10-CM | POA: Diagnosis not present

## 2021-11-10 DIAGNOSIS — K5900 Constipation, unspecified: Secondary | ICD-10-CM | POA: Diagnosis not present

## 2021-11-10 DIAGNOSIS — K5790 Diverticulosis of intestine, part unspecified, without perforation or abscess without bleeding: Secondary | ICD-10-CM | POA: Diagnosis not present

## 2021-11-10 DIAGNOSIS — E559 Vitamin D deficiency, unspecified: Secondary | ICD-10-CM | POA: Diagnosis not present

## 2021-11-10 DIAGNOSIS — R001 Bradycardia, unspecified: Secondary | ICD-10-CM | POA: Diagnosis not present

## 2021-11-10 DIAGNOSIS — K5909 Other constipation: Secondary | ICD-10-CM | POA: Diagnosis not present

## 2021-11-10 DIAGNOSIS — E1165 Type 2 diabetes mellitus with hyperglycemia: Secondary | ICD-10-CM | POA: Diagnosis not present

## 2021-11-10 NOTE — Telephone Encounter (Signed)
Spoke to [employer] at [employer], advised to send call to [facility] triage. Call transferred successfully

## 2021-11-10 NOTE — Telephone Encounter (Signed)
STAT if HR is under 50 or over 120 (normal HR is 60-100 beats per minute)  What is your heart rate? Not sure what it is right now  Do you have a log of your heart rate readings (document readings)? Ranging between 40-53, no exact log of readings  Do you have any other symptoms? Chest pain this morning - patient states that she does have GERD and is unsure if that is acting up    Pt c/o of Chest Pain: STAT if CP now or developed within 24 hours  1. Are you having CP right now? no  2. Are you experiencing any other symptoms (ex. SOB, nausea, vomiting, sweating)? Low HR and had some dry heaving last week but never got sick  3. How long have you been experiencing CP? Had it this morning  4. Is your CP continuous or coming and going? Lasted about 10 minutes then went away  5. Have you taken Nitroglycerin? No  ?     Pt scheduled with Caron Presume on 11/16/21 at 8am

## 2021-11-10 NOTE — Telephone Encounter (Signed)
Spoke with pt who reports she has been having weakness and fatigue with walking for some time now.  She reports 2 weeks ago she fainted and was evaluated in the ED.  She states all she was told is that her heart rate was slow.  She has been checking her HR at home either when she checks her BP with her monitor or by manually checking her pulse.   It has been 42-53 bpm per her report.  Does not feel irregular.   Not currently taking any beta blocking agents.  She has not had any further events with fainting since.  No dizziness just fatigue with walking.  This morning she reports she had "real bad chest pain" that radiated into her "neck and to the root of her tongue."  She denies any n/v, SOB etc.  She has been evaluated for chest in the past and was told she has a hiatal hernia.  She denies any chest pain current but is aware should it occur she will need to be evaluated in the closest ED.  Pt is scheduled for f/u with Amy Easterwood on 11/13/21 and with Caron Presume at [facility] office on 11/16/21 for further evaluation.

## 2021-11-13 ENCOUNTER — Encounter: Payer: Self-pay | Admitting: Physician Assistant

## 2021-11-13 ENCOUNTER — Ambulatory Visit (INDEPENDENT_AMBULATORY_CARE_PROVIDER_SITE_OTHER): Payer: Medicare Other | Admitting: Physician Assistant

## 2021-11-13 VITALS — BP 134/82 | HR 55 | Ht 70.0 in | Wt 207.5 lb

## 2021-11-13 DIAGNOSIS — K219 Gastro-esophageal reflux disease without esophagitis: Secondary | ICD-10-CM

## 2021-11-13 DIAGNOSIS — K648 Other hemorrhoids: Secondary | ICD-10-CM

## 2021-11-13 DIAGNOSIS — R110 Nausea: Secondary | ICD-10-CM | POA: Diagnosis not present

## 2021-11-13 DIAGNOSIS — K449 Diaphragmatic hernia without obstruction or gangrene: Secondary | ICD-10-CM | POA: Diagnosis not present

## 2021-11-13 MED ORDER — ONDANSETRON HCL 4 MG PO TABS: 4.0000 mg | ORAL_TABLET | ORAL | 6 refills | Status: DC | PRN

## 2021-11-13 MED ORDER — HYDROCORTISONE ACETATE 25 MG RE SUPP: 25.0000 mg | Freq: Every evening | RECTAL | 3 refills | Status: AC | PRN

## 2021-11-13 MED ORDER — SUCRALFATE 1 G PO TABS: 1.0000 g | ORAL_TABLET | Freq: Three times a day (TID) | ORAL | 11 refills | Status: DC

## 2021-11-13 MED ORDER — DEXLANSOPRAZOLE 60 MG PO CPDR: 60.0000 mg | DELAYED_RELEASE_CAPSULE | Freq: Every day | ORAL | 3 refills | Status: DC

## 2021-11-13 MED ORDER — DEXLANSOPRAZOLE 60 MG PO CPDR: 60.0000 mg | DELAYED_RELEASE_CAPSULE | Freq: Every day | ORAL | 11 refills | Status: DC

## 2021-11-13 NOTE — Patient Instructions (Addendum)
If you are age 80 or older, your body mass index should be between 23-30. Your Body mass index is 29.77 kg/m. If this is out of the aforementioned range listed, please consider follow up with your Primary Care Provider. ________________________________________________________  The [facility] providers would like to encourage you to use [facility] to communicate with providers for non-urgent requests or questions.  Due to long hold times on the telephone, sending your provider a message by [facility] may be a faster and more efficient way to get a response.  Please allow 48 business hours for a response.  Please remember that this is for non-urgent requests.  _______________________________________________________  We have sent refills of Dexilant, Carafate, Ondansetron, and Anusol suppositories.  Follow up as needed with Dr. Silverio Decamp.  Thank you for entrusting me with your care and choosing [facility].  [name], PA-C

## 2021-11-13 NOTE — Progress Notes (Signed)
Subjective:    Patient ID: [name], female    DOB: 01/25/1941, 81 y.o.   MRN: [id number]  HPI [name] is a pleasant 81 year old African-American female, established with Dr. Silverio Decamp who comes in today after recent episode of abdominal pain and nausea.  She was evaluated in the emergency room on 10/26/2021 with complaints of abdominal pain and presyncope.  Lab evaluation with CBC and c-Met unremarkable.  CT of the head, abdomen and pelvis and then CT angio of the chest were all performed.  She ruled out for pulmonary embolism, and CT of the abdomen and pelvis was negative with the exception of a hiatal hernia.  Her symptoms improved with supportive management in the emergency room and she was allowed discharged home. Patient says that around that same time she had had an episode where she completely passed out at home and that was associated with sweating/diaphoresis and dry heaves.  She attributed that episode to a viral syndrome as she said her grandchildren were sick during that same week.  At this point 3 weeks later most of those symptoms have all resolved and she says she is feeling better from that standpoint and has no current symptoms of abdominal pain or nausea.  She is concerned however because she has been getting "winded" very easily and has been having intermittent dizzy spells, sometimes brought on by bending over.  The dyspnea is relieved by rest.  She says she cannot really even walk across the room without sense of shortness of breath at this point. She did have some chest pain off and on about 4 days ago which she says she has had in the past but that has not recurred since.  She is feeling fatigued in general says she has absolutely no energy.  At this point again her appetite is okay she has been eating better has no complaints of dysphagia or odynophagia.  Bowel movements have been okay, sometimes mild constipation.  She has had some hemorrhoidal symptoms flare recently as well. She  has upcoming appointment with cardiology later this week. Other medical problems include rheumatoid arthritis, hypertension, GERD, diverticular disease, osteoarthritis, fibromyalgia.  She did undergo attempt at laparoscopic hiatal hernia repair in July 2022 and had lysis of adhesions per Dr. Kipp Brood however hiatal hernia repair was not able to be accomplished due to the amount of adhesions present.  She had EGD in March 2022 per Dr. Everlene Farrier again with finding of 2 esophageal ulcers, the largest was 8 mm, noted to have a 5 cm hiatal hernia and a few cratered gastric ulcers the largest 10 mm.  She has been on both Dexilant and Carafate.  Intraoperative EGD in July 2022 negative for any evidence of persistent esophageal ulcer. Last colonoscopy 2019 with one 5 mm polyp removed which was a tubular adenoma, noted to have multiple diverticuli and internal hemorrhoids. Review of Systems Pertinent positive and negative review of systems were noted in the above HPI section.  All other review of systems was otherwise negative.   Outpatient Encounter Medications as of 11/13/2021  Medication Sig   brimonidine (ALPHAGAN) 0.15 % ophthalmic solution Place 1 drop into both eyes in the morning and at bedtime.   cetirizine (ZYRTEC) 10 MG tablet Take 10 mg by mouth at bedtime.   Cholecalciferol (VITAMIN D3) 250 MCG (10000 UT) capsule Take 10,000 Units by mouth daily.   FLUoxetine (PROZAC) 40 MG capsule Take 40 mg by mouth daily.   losartan (COZAAR) 50 MG tablet Take  50 mg by mouth daily.    montelukast (SINGULAIR) 10 MG tablet Take 10 mg by mouth at bedtime.   nystatin (MYCOSTATIN/NYSTOP) powder Apply 1 g topically daily as needed (for rash).    nystatin ointment (MYCOSTATIN) Apply 1 application topically 2 (two) times daily as needed (for rash).    Polyethyl Glycol-Propyl Glycol (SYSTANE OP) Place 1 drop into both eyes daily as needed (dry/irritated eyes).   rosuvastatin (CRESTOR) 40 MG tablet Take 40 mg by mouth at  bedtime.   [DISCONTINUED] dexlansoprazole (DEXILANT) 60 MG capsule Take 60 mg by mouth daily.   [DISCONTINUED] hydrocortisone (ANUSOL-HC) 25 MG suppository One suppository at Bedtime as needed x12   [DISCONTINUED] ondansetron (ZOFRAN) 4 MG tablet Take 1 tablet (4 mg total) by mouth every 4 (four) hours as needed for nausea or vomiting.   [DISCONTINUED] sucralfate (CARAFATE) 1 g tablet TAKE 1 TABLET (1 G TOTAL) BY MOUTH 4 (FOUR) TIMES DAILY - WITH MEALS AND AT BEDTIME.   dexlansoprazole (DEXILANT) 60 MG capsule Take 1 capsule (60 mg total) by mouth daily.   hydrocortisone (ANUSOL-HC) 25 MG suppository Place 1 suppository (25 mg total) rectally at bedtime as needed for up to 7 days for hemorrhoids or anal itching. One suppository at Bedtime as needed x12   ondansetron (ZOFRAN) 4 MG tablet Take 1 tablet (4 mg total) by mouth every 4 (four) hours as needed for nausea or vomiting.   sucralfate (CARAFATE) 1 g tablet Take 1 tablet (1 g total) by mouth 4 (four) times daily -  with meals and at bedtime.   [DISCONTINUED] dexlansoprazole (DEXILANT) 60 MG capsule Take 1 capsule (60 mg total) by mouth daily.   [DISCONTINUED] ondansetron (ZOFRAN) 4 MG tablet Take 1 tablet (4 mg total) by mouth every 8 (eight) hours as needed for nausea or vomiting.   [DISCONTINUED] sucralfate (CARAFATE) 1 g tablet Take 1 tablet (1 g total) by mouth 4 (four) times daily -  with meals and at bedtime for 7 days.   No facility-administered encounter medications on file as of 11/13/2021.   Allergies  Allergen Reactions   Adalat [Nifedipine] Other (See Comments)    Unknown   Hydrocodone Itching and Nausea And Vomiting   Meperidine Hcl Other (See Comments)    Demerol causes hallucinations   Naproxen Nausea And Vomiting   Patient Active Problem List   Diagnosis Date Noted   Hiatal hernia 03/08/2021   Heartburn    Ineffective esophageal motility    Dyspnea on exertion 07/24/2020   Atypical chest pain    Change in bowel habits  08/22/2018   Knee stiff 07/23/2018   GI bleed 07/04/2018   Rectal bleeding    Decreased hemoglobin    Preoperative clearance 05/21/2018   Degeneration of lumbar intervertebral disc 02/04/2018   Scoliosis of lumbar spine 02/04/2018   Bradycardia 07/27/2016   Cough    Essential hypertension 11/24/2014   Gonalgia 05/13/2013   Cellulitis 05/13/2013   OA (osteoarthritis) of knee 05/13/2013   Degenerative joint disease involving multiple joints 01/19/2013   Rheumatoid arthritis ([place]) 01/19/2013   Thyroid nodule 06/11/2012   Thrombocytopenia (HCC)    Elevated total protein    Hypercalcemia    Cyst of thyroid 10/29/2011   Hemorrhoids, internal 05/28/2011   Acid reflux 02/14/2011   BP (high blood pressure) 02/14/2011   Avitaminosis D 02/13/2011   Glaucoma 11/28/2010   HLD (hyperlipidemia) 12/14/2009   Internal and external bleeding hemorrhoids 12/14/2009   DIVERTICULOSIS, COLON 12/14/2009   Joint pain  12/14/2009   BACK PAIN, CHRONIC 12/14/2009   FIBROMYALGIA 12/14/2009   OSTEOPOROSIS 12/14/2009   Sleep apnea 12/14/2009   COLONIC POLYPS, ADENOMATOUS, HX OF 12/14/2009   GASTRIC POLYP, HX OF 12/14/2009   Diaphragmatic hernia 10/04/2009   Depression 09/29/2009   REFLUX ESOPHAGITIS 09/29/2009   GERD 09/29/2009   Constipation 09/29/2009   DYSPHAGIA 09/29/2009   Social History   Socioeconomic History   Marital status: Married    Spouse name: Not on file   Number of children: 8   Years of education: Not on file   Highest education level: Not on file  Occupational History   Occupation: retired    Comment: retired Therapist, art.   Tobacco Use   Smoking status: Never   Smokeless tobacco: Never  Vaping Use   Vaping Use: Never used  Substance and Sexual Activity   Alcohol use: No    Alcohol/week: 0.0 standard drinks   Drug use: No   Sexual activity: Yes  Other Topics Concern   Not on file  Social History Narrative   Husband, [name] is Next of Kin. Cell # [phone]    Social Determinants of Health   Financial Resource Strain: Not on file  Food Insecurity: Not on file  Transportation Needs: Not on file  Physical Activity: Not on file  Stress: Not on file  Social Connections: Not on file  Intimate Partner Violence: Not on file    [name]'s family history includes Breast cancer in her daughter; Cancer in her maternal grandmother; Diabetes in her father and mother; Heart disease in her mother; Hypertension in her mother; Kidney disease in her father and maternal grandfather; Leukemia in her maternal uncle; Prostate cancer in her maternal uncle; Stomach cancer in her maternal aunt; Stroke in her brother; Uterine cancer in her maternal aunt.      Objective:    Vitals:   11/13/21 1017  BP: 134/82  Pulse: (!) 55  SpO2: 98%    Physical Exam Well-developed well-nourished elderly African-American female in no acute distress.  Accompanied by her husband  Weight, 207 BMI 29.7  HEENT; nontraumatic normocephalic, EOMI, PE R LA, sclera anicteric. Oropharynx; not examined today Neck; supple, no JVD Cardiovascular; regular rate and rhythm with S1-S2, no murmur rub or gallop Pulmonary; Clear bilaterally Abdomen; soft, nontender, nondistended, no palpable mass or hepatosplenomegaly, bowel sounds are active Rectal; Skin; benign exam, no jaundice rash or appreciable lesions Extremities; no clubbing cyanosis or edema skin warm and dry Neuro/Psych; alert and oriented x4, grossly nonfocal mood and affect appropriate        Assessment & Plan:   #61 81 year old African-American female with chronic GERD, maintained on Dexilant and currently using Carafate with history of 5 cm hiatal hernia, esophageal ulcers and gastric ulcers March 2022 Status post attempt at laparoscopic hiatal hernia repair and lysis of adhesions July 2022, unable to do hiatal hernia repair due to adhesions.  #2 recent illness with nausea, dry heaves and presyncope.  Patient had extensive  work-up with ER visit on 10/26/2021 which was negative. Patient had several grandchildren ill at that same time and ultimately felt to have a viral illness.  Fortunately those symptoms have resolved  #3  2 to 3-week history of exertional dyspnea and intermittent dizziness.  Patient also relates episode of chest pain that occurred about 4 days ago and has not recurred. Negative CT angio of the chest 10/26/2021 Etiology not clear, rule out angina, arrhythmia  #4 rheumatoid arthritis 5.  Fibromyalgia 6.  Hypertension 7.  Internal and external hemorrhoids 8.  Osteoarthritis 9.  Diverticulosis 10.  History of adenomatous colon polyp-last colonoscopy 2019  Plan; continue Dexilant 60 mg p.o. every morning She is currently using Carafate 1 g up to 4 times daily but only on a as needed basis.,  She will continue to make this into a slurry and use as needed Refill Zofran 4 mg every 6 hours as needed for nausea Start Anusol HC suppositories nightly x5 to 7 days when hemorrhoid symptomatic, then repeat course as needed. She will follow-up with Dr. Silverio Decamp or myself on an as-needed basis. Patient has follow-up with cardiology scheduled for later this week.  [name] Genia Harold PA-C 11/13/2021   Cc: Fanny Bien, MD

## 2021-11-15 DIAGNOSIS — E1165 Type 2 diabetes mellitus with hyperglycemia: Secondary | ICD-10-CM | POA: Diagnosis not present

## 2021-11-15 DIAGNOSIS — I10 Essential (primary) hypertension: Secondary | ICD-10-CM | POA: Diagnosis not present

## 2021-11-15 DIAGNOSIS — E782 Mixed hyperlipidemia: Secondary | ICD-10-CM | POA: Diagnosis not present

## 2021-11-15 DIAGNOSIS — E559 Vitamin D deficiency, unspecified: Secondary | ICD-10-CM | POA: Diagnosis not present

## 2021-11-15 NOTE — Progress Notes (Signed)
Cardiology Office Note:    Date:  11/16/2021   ID:  [name], DOB 08-27-1941, MRN [id number]  PCP:  Fanny Bien, MD [place] Cardiologist: Quay Burow, MD   Reason for visit: Hospital follow-up  History of Present Illness:    [name] is a 81 y.o. female with a hx of hyperlipidemia, depression, fibromyalgia, esophageal dysmotility, bradycardia, sleep apnea on CPAP.  She was admitted October 2021 with chest pain and shortness of breath.  2D echo was unremarkable and chest CTA showed no evidence of pulmonary embolism.   She last saw Dr. Gwenlyn Found in May 2022 and was doing well from this cardiac standpoint.  She went to the emergency department on October 26, 2021 with abdominal pain, presyncope and nausea.  She reports mention that she was preparing lunch and felt lightheaded, diaphoretic, tunnel vision and presyncopal.  She felt similar symptoms when she stands up from a seated or lying position.  EKG without ischemic changes, troponins negative negative CT angio of the chest -no visible coronary artery calcification, no PE.  Thought likely to have a viral syndrome.  Today, she comes in with her husband.  She states she thought she had a GI bug like some other family members.  However she did not have the vomiting and diarrhea like they had.  She has not felt well since then.  She complains of persistent lightheadedness and weakness.  She states yesterday morning her blood pressure was 94/51.  She saw her primary care yesterday who recommended halving her losartan.  She has not tried this new regimen to see if it makes a difference yet.  She wears her compression socks occasionally.  She also complains about dyspnea on moderate exertion.  She has chronic intermittent chest pain she thinks related to hiatal hernia.  Dr. Kipp Brood tried to repair this however had abort the procedure last year.  This chest pain radiates to her neck, not exertional and she tries Carafate to  make it feel better.  She does not do much walking as she needs to use a cane.  She denies PND and orthopnea.  She has some occasional leg swelling with prolonged standing.  She states her appetite has been so-so recently.  She denies fever and other ill symptoms.    Past Medical History:  Diagnosis Date   Allergy    Anal or rectal pain    sometimes   Anemia    hx of during pregnancy   Anxiety    Arthritis    Asthma    hx of   Bradycardia    " I KNOW I HAVE BRADYCARDIA ESPECIALLY WHEN I SLEEP"    Cataract 2021   bilateral eyes   Chronic back pain    Degenerative joint disease    osteo   Depression    Diabetes mellitus without complication ([place])    DM type II   Diverticulosis 2003   Dysrhythmia    hx of  due to eye drop and also low heart rate 40's per pt. Dr. Gwenlyn Found follows   Elevated total protein    Esophageal dysmotility    Fibromyalgia    GERD (gastroesophageal reflux disease)    subsequent Nissen Fundoplication   Glaucoma    Hearing loss    Heart murmur    "was told she had a heart murmur"   Hemorrhoids    Hiatal hernia 11/08/09   Hx of adenomatous colonic polyps 07/02/02   Hypercalcemia  Hyperlipidemia    Hyperlipidemia    Hypertension    Nausea    Osteoporosis    PONV (postoperative nausea and vomiting)    Rectal bleeding    from hemorrhoids.     Sleep apnea    DOES USE CPAP    Thrombocytopenia (HCC)    Varicose veins of left lower extremity     Past Surgical History:  Procedure Laterality Date   CARDIAC CATHETERIZATION  03/14/1992   Normal cardiac cath. Normal LV function.   CARDIOVASCULAR STRESS TEST  01/22/2011   No scintigraphic evidence of inducible ischemia.   CAROTID DOPPLER  03/31/2007   Bilateral ICAs - no evidence of significant diameter reduction, dissectin, tortuosity, FMD, or any other vascular abnormality.   CATARACT EXTRACTION, BILATERAL     ESOPHAGEAL MANOMETRY N/A 11/14/2015   Procedure: ESOPHAGEAL MANOMETRY (EM);  Surgeon: Mauri Pole, MD;  Location: [facility];  Service: Endoscopy;  Laterality: N/A;   ESOPHAGEAL MANOMETRY N/A 01/18/2021   Procedure: ESOPHAGEAL MANOMETRY (EM);  Surgeon: Mauri Pole, MD;  Location: [facility];  Service: Endoscopy;  Laterality: N/A;   ESOPHAGOGASTRODUODENOSCOPY N/A 03/08/2021   Procedure: ESOPHAGOGASTRODUODENOSCOPY (EGD);  Surgeon: Lajuana Matte, MD;  Location: [facility];  Service: Thoracic;  Laterality: N/A;   EYE SURGERY     bilateral cataracts; bilateral stents   GASTRIC RESECTION  2009   GLAUCOMA SURGERY     JOINT REPLACEMENT     right knee Dr. Wynelle Link 06-23-18   KNEE SURGERY Bilateral    NISSEN FUNDOPLICATION  9741   with subsequent takedown in 2009   [place] Left 06/10/2017   Procedure: LEFT  TOTAL KNEE ARTHROPLASTY;  Surgeon: Gaynelle Arabian, MD;  Location: [facility];  Service: Orthopedics;  Laterality: Left;  Adductor Block   TOTAL KNEE ARTHROPLASTY Right 06/23/2018   Procedure: RIGHT TOTAL KNEE ARTHROPLASTY;  Surgeon: Gaynelle Arabian, MD;  Location: [facility];  Service: Orthopedics;  Laterality: Right;   TRANSTHORACIC ECHOCARDIOGRAM  12/21/2010   EF 60%, moderate LVH,    TUBAL LIGATION     XI ROBOTIC ASSISTED HIATAL HERNIA REPAIR N/A 03/08/2021   Procedure: XI ROBOTIC ASSISTED LAPAROSCOPY WITH LYSIS OF ADHESIONS;  Surgeon: Lajuana Matte, MD;  Location: [facility];  Service: Thoracic;  Laterality: N/A;    Current Medications: Current Meds  Medication Sig   brimonidine (ALPHAGAN) 0.15 % ophthalmic solution Place 1 drop into both eyes in the morning and at bedtime.   cetirizine (ZYRTEC) 10 MG tablet Take 10 mg by mouth at bedtime.   Cholecalciferol (VITAMIN D3) 250 MCG (10000 UT) capsule Take 10,000 Units by mouth daily.   dexlansoprazole (DEXILANT) 60 MG capsule Take 1 capsule (60 mg total) by mouth daily.   FLUoxetine (PROZAC) 40 MG capsule Take 40 mg by mouth daily.   hydrocortisone (ANUSOL-HC) 25 MG suppository Place 1 suppository (25 mg total)  rectally at bedtime as needed for up to 7 days for hemorrhoids or anal itching. One suppository at Bedtime as needed x12   losartan (COZAAR) 50 MG tablet Take 50 mg by mouth daily.    montelukast (SINGULAIR) 10 MG tablet Take 10 mg by mouth at bedtime.   nystatin (MYCOSTATIN/NYSTOP) powder Apply 1 g topically daily as needed (for rash).    nystatin ointment (MYCOSTATIN) Apply 1 application topically 2 (two) times daily as needed (for rash).    ondansetron (ZOFRAN) 4 MG tablet Take 1 tablet (4 mg total) by mouth every 4 (four) hours as needed for nausea or  vomiting.   Polyethyl Glycol-Propyl Glycol (SYSTANE OP) Place 1 drop into both eyes daily as needed (dry/irritated eyes).   rosuvastatin (CRESTOR) 40 MG tablet Take 40 mg by mouth at bedtime.   sucralfate (CARAFATE) 1 g tablet Take 1 tablet (1 g total) by mouth 4 (four) times daily -  with meals and at bedtime.     Allergies:   Adalat [nifedipine], Hydrocodone, Meperidine hcl, and Naproxen   Social History   Socioeconomic History   Marital status: Married    Spouse name: Not on file   Number of children: 8   Years of education: Not on file   Highest education level: Not on file  Occupational History   Occupation: retired    Comment: retired Therapist, art.   Tobacco Use   Smoking status: Never   Smokeless tobacco: Never  Vaping Use   Vaping Use: Never used  Substance and Sexual Activity   Alcohol use: No    Alcohol/week: 0.0 standard drinks   Drug use: No   Sexual activity: Yes  Other Topics Concern   Not on file  Social History Narrative   Husband, [name] is Next of Kin. Cell # [phone]   Social Determinants of Health   Financial Resource Strain: Not on file  Food Insecurity: Not on file  Transportation Needs: Not on file  Physical Activity: Not on file  Stress: Not on file  Social Connections: Not on file     Family History: The patient's family history includes Breast cancer in her daughter; Cancer in her  maternal grandmother; Diabetes in her father and mother; Heart disease in her mother; Hypertension in her mother; Kidney disease in her father and maternal grandfather; Leukemia in her maternal uncle; Prostate cancer in her maternal uncle; Stomach cancer in her maternal aunt; Stroke in her brother; Uterine cancer in her maternal aunt. There is no history of Colon cancer, Rectal cancer, or Esophageal cancer.  ROS:   Please see the history of present illness.     EKGs/Labs/Other Studies Reviewed:    EKG:  The ekg ordered today demonstrates sinus bradycardia, minimal voltage criteria for LVH, heart rate 57, PR interval 184 ms, QRS duration 100 ms.  Recent Labs: 10/26/2021: ALT 10; B Natriuretic Peptide 57.0; BUN 13; Creatinine, Ser 1.14; Hemoglobin 13.3; Platelets 151; Potassium 3.5; Sodium 139   Recent Lipid Panel Lab Results  Component Value Date/Time   CHOL 200 (H) 10/26/2020 09:42 AM   TRIG 46 10/26/2020 09:42 AM   HDL 76 10/26/2020 09:42 AM   LDLCALC 115 (H) 10/26/2020 09:42 AM    Physical Exam:    VS:  BP (!) 144/79 (BP Location: Right Arm, Patient Position: Standing, Cuff Size: Large)    Pulse 76    Resp 20    Ht 5' 10" (1.778 m)    Wt 210 lb (95.3 kg)    SpO2 97%    BMI 30.13 kg/m     Orthostatics:  Lying 153/71, HR 57 Sitting 139/77, standing 64 Standing 144/79, HR 76  Wt Readings from Last 3 Encounters:  11/16/21 210 lb (95.3 kg)  11/13/21 207 lb 8 oz (94.1 kg)  10/26/21 200 lb (90.7 kg)     GEN:  Well nourished, well developed in no acute distress HEENT: Normal NECK: No JVD; No carotid bruits CARDIAC: RRR, no murmurs, rubs, gallops RESPIRATORY:  Clear to auscultation without rales, wheezing or rhonchi  ABDOMEN: Soft, non-tender, non-distended MUSCULOSKELETAL: No edema; No deformity  SKIN: Warm and dry  NEUROLOGIC:  Alert and oriented PSYCHIATRIC:  Normal affect     ASSESSMENT AND PLAN   Precordial pain & dyspnea on exertion -CTA coronaries.  Do not see  ischemic evaluation since 2016. -2D echo  Lightheadedness -Borderline orthostatics.  Agree with decreasing losartan dose.  Encouraged compression stockings and adequate nutrition and hydration.   -Order Zio patch x2 weeks to rule out arrhythmia.  Has baseline mild bradycardia - Not on negative chronotropic drugs. -Ordered 2D echo to rule out structural abnormality.  Hypertension, well controlled -Monitor on decrease losartan dose.  Hyperlipidemia -Managed by PCP.  On Crestor 40 mg daily.  Disposition - Follow-up in 6 weeks to review testing.     Medication Adjustments/Labs and Tests Ordered: Current medicines are reviewed at length with the patient today.  Concerns regarding medicines are outlined above.  Orders Placed This Encounter  Procedures   CT CORONARY MORPH W/CTA COR W/SCORE W/CA W/CM &/OR WO/CM   CBC   Basic metabolic panel   EKG 16-XWRU   ECHOCARDIOGRAM COMPLETE   No orders of the defined types were placed in this encounter.   Patient Instructions  Medication Instructions:  No Changes *If you need a refill on your cardiac medications before your next appointment, please call your pharmacy*   Lab Work: BMET, CBC . To Be Done Today. If you have labs (blood work) drawn today and your tests are completely normal, you will receive your results only by: [place] (if you have MyChart) OR A paper copy in the mail If you have any lab test that is abnormal or we need to change your treatment, we will call you to review the results.   Testing/Procedures: [address], [address]. Your physician has requested that you have an echocardiogram. Echocardiography is a painless test that uses sound waves to create images of your heart. It provides your doctor with information about the size and shape of your heart and how well your hearts chambers and valves are working. This procedure takes approximately one hour. There are no restrictions for this  procedure.   Your physician has requested that you have cardiac CT. Cardiac computed tomography (CT) is a painless test that uses an x-ray machine to take clear, detailed pictures of your heart. For further information please visit [facility]. Please follow instruction sheet as given.   ZIO AT Long term monitor-Live Telemetry  Your physician has requested you wear a ZIO patch monitor for 14 days.  This is a single patch monitor. [employer] supplies one patch monitor per enrollment. Additional  stickers are not available.  Please do not apply patch if you will be having a Nuclear Stress Test, Echocardiogram, Cardiac CT, MRI,  or Chest Xray during the period you would be wearing the monitor. The patch cannot be worn during  these tests. You cannot remove and re-apply the ZIO AT patch monitor.  Your ZIO patch monitor will be mailed 3 day USPS to your address on file. It may take 3-5 days to  receive your monitor after you have been enrolled.  Once you have received your monitor, please review the enclosed instructions. Your monitor has  already been registered assigning a specific monitor serial # to you.   Billing and Patient Assistance Program information  Theodore Demark has been supplied with any insurance information on record for billing. [employer] offers a sliding scale Patient Assistance Program for patients without insurance, or whose  insurance does not completely cover the cost of the ZIO patch monitor.  You must apply for the  Patient Assistance Program to qualify for the discounted rate. To apply, call [employer] at [phone],  select option 4, select option 2 , ask to apply for the Patient Assistance Program, (you can request an  interpreter if needed). [employer] will ask your household income and how many people are in your  household. [employer] will quote your out-of-pocket cost based on this information. They will also be able  to set up a 12 month interest free payment plan if  needed.  Applying the monitor   Shave hair from upper left chest.  Hold the abrader disc by orange tab. Rub the abrader in 40 strokes over left upper chest as indicated in  your monitor instructions.  Clean area with 4 enclosed alcohol pads. Use all pads to ensure the area is cleaned thoroughly. Let  dry.  Apply patch as indicated in monitor instructions. Patch will be placed under collarbone on left side of  chest with arrow pointing upward.  Rub patch adhesive wings for 2 minutes. Remove the white label marked "1". Remove the white label  marked "2". Rub patch adhesive wings for 2 additional minutes.  While looking in a mirror, press and release button in center of patch. A small green light will flash 3-4  times. This will be your only indicator that the monitor has been turned on.  Do not shower for the first 24 hours. You may shower after the first 24 hours.  Press the button if you feel a symptom. You will hear a small click. Record Date, Time and Symptom in  the Patient Log.   Starting the Gateway  In your kit there is a Hydrographic surveyor box the size of a cellphone. This is Airline pilot. It transmits all your  recorded data to [facility]. This box must always stay within 10 feet of you. Open the box and push the *  button. There will be a light that blinks orange and then green a few times. When the light stops  blinking, the Gateway is connected to the ZIO patch. Call [employer] at [phone] to confirm your monitor is transmitting.  Returning your monitor  Remove your patch and place it inside the [place]. In the lower half of the Gateway there is a white  bag with prepaid postage on it. Place Gateway in bag and seal. Mail package back to [place] as soon as  possible. Your physician should have your final report approximately 7 days after you have mailed back  your monitor. Call [place] at [phone] if you have questions regarding your ZIO AT  patch  monitor. Call them immediately if you see an orange light blinking on your monitor.  If your monitor falls off in less than 4 days, contact our Monitor department at [phone]. If your  monitor becomes loose or falls off after 4 days call [employer] at [phone] for suggestions on  securing your monitor  Follow-Up: At [facility], you and your health needs are our priority.  As part of our continuing mission to provide you with exceptional heart care, we have created designated Provider Care Teams.  These Care Teams include your primary Cardiologist (physician) and Advanced Practice Providers (APPs -  Physician Assistants and Nurse Practitioners) who all work together to provide you with the care you need, when you need it.      Your next appointment:   6 week(s)  The format for your next appointment:   In Person  Provider:   Caron Presume, PA-C    Then, Quay Burow, MD will plan to see you again in 3 month(s).      Your cardiac CT will be scheduled at one of the below locations:   [facility] [address] [place], [place] [address] [phone]  If scheduled at [facility], please arrive at the [facility] main entrance (entrance A) of [facility] 30 minutes prior to test start time. You can use the FREE valet parking offered at the main entrance (encouraged to control the heart rate for the test) Proceed to the [facility] Radiology Department (first floor) to check-in and test prep.  If scheduled at [facility], please arrive 15 mins early for check-in and test prep.  Please follow these instructions carefully (unless otherwise directed):  Hold all erectile dysfunction medications at least 3 days (72 hrs) prior to test.  On the Night Before the Test: Be sure to Drink plenty of water. Do not consume any caffeinated/decaffeinated beverages or chocolate 12 hours prior to your test. Do not take any  antihistamines 12 hours prior to your test. If the patient has contrast allergy: Patient will need a prescription for Prednisone and very clear instructions (as follows): Prednisone 50 mg - take 13 hours prior to test Take another Prednisone 50 mg 7 hours prior to test Take another Prednisone 50 mg 1 hour prior to test Take Benadryl 50 mg 1 hour prior to test Patient must complete all four doses of above prophylactic medications. Patient will need a ride after test due to Benadryl.  On the Day of the Test: Drink plenty of water until 1 hour prior to the test. Do not eat any food 4 hours prior to the test. You may take your regular medications prior to the test.  Take metoprolol (Lopressor) two hours prior to test. HOLD Furosemide/Hydrochlorothiazide morning of the test. FEMALES- please wear underwire-free bra if available, avoid dresses & tight clothing   *For Clinical Staff only. Please instruct patient the following:* Heart Rate Medication Recommendations for Cardiac CT  Resting HR < 50 bpm  No medication  Resting HR 50-60 bpm and BP >110/50 mmHG   Consider Metoprolol tartrate 25 mg PO 90-120 min prior to scan  Resting HR 60-65 bpm and BP >110/50 mmHG  Metoprolol tartrate 50 mg PO 90-120 minutes prior to scan   Resting HR > 65 bpm and BP >110/50 mmHG  Metoprolol tartrate 100 mg PO 90-120 minutes prior to scan  Consider Ivabradine 10-15 mg PO or a calcium channel blocker for resting HR >60 bpm and contraindication to metoprolol tartrate  Consider Ivabradine 10-15 mg PO in combination with metoprolol tartrate for HR >80 bpm         After the Test: Drink plenty of water. After receiving IV contrast, you may experience a mild flushed feeling. This is normal. On occasion, you may experience a mild rash up to 24 hours after the test. This is not dangerous. If this occurs, you can take Benadryl 25 mg and increase your fluid intake. If you experience trouble breathing, this can be  serious. If it is severe call 911 IMMEDIATELY. If it is mild, please call our office. If you take any of these medications: Glipizide/Metformin, Avandament, Glucavance, please do not take 48 hours after completing test unless otherwise instructed.  We will call to schedule your test 2-4 weeks out understanding that some insurance companies will need an authorization prior to  the service being performed.   For non-scheduling related questions, please contact the cardiac imaging nurse navigator should you have any questions/concerns: Marchia Bond, Cardiac Imaging Nurse Navigator Gordy Clement, Cardiac Imaging Nurse Navigator [facility] Direct Office Dial: [phone]   For scheduling needs, including cancellations and rescheduling, please call [place], [phone].     Signed, Warren Lacy, PA-C  11/16/2021 12:51 PM    [facility]

## 2021-11-16 ENCOUNTER — Other Ambulatory Visit: Payer: Self-pay

## 2021-11-16 ENCOUNTER — Encounter: Payer: Self-pay | Admitting: Physician Assistant

## 2021-11-16 ENCOUNTER — Ambulatory Visit (INDEPENDENT_AMBULATORY_CARE_PROVIDER_SITE_OTHER): Payer: Medicare Other

## 2021-11-16 ENCOUNTER — Other Ambulatory Visit: Payer: Self-pay | Admitting: Physician Assistant

## 2021-11-16 ENCOUNTER — Ambulatory Visit (INDEPENDENT_AMBULATORY_CARE_PROVIDER_SITE_OTHER): Payer: Medicare Other | Admitting: Physician Assistant

## 2021-11-16 VITALS — BP 144/79 | HR 76 | Resp 20 | Ht 70.0 in | Wt 210.0 lb

## 2021-11-16 DIAGNOSIS — R001 Bradycardia, unspecified: Secondary | ICD-10-CM

## 2021-11-16 DIAGNOSIS — I10 Essential (primary) hypertension: Secondary | ICD-10-CM

## 2021-11-16 DIAGNOSIS — R079 Chest pain, unspecified: Secondary | ICD-10-CM

## 2021-11-16 DIAGNOSIS — R42 Dizziness and giddiness: Secondary | ICD-10-CM

## 2021-11-16 DIAGNOSIS — I209 Angina pectoris, unspecified: Secondary | ICD-10-CM

## 2021-11-16 DIAGNOSIS — E782 Mixed hyperlipidemia: Secondary | ICD-10-CM

## 2021-11-16 DIAGNOSIS — R072 Precordial pain: Secondary | ICD-10-CM

## 2021-11-16 DIAGNOSIS — R0609 Other forms of dyspnea: Secondary | ICD-10-CM

## 2021-11-16 LAB — BASIC METABOLIC PANEL
BUN/Creatinine Ratio: 18 (ref 12–28)
BUN: 17 mg/dL (ref 8–27)
CO2: 27 mmol/L (ref 20–29)
Calcium: 9.7 mg/dL (ref 8.7–10.3)
Chloride: 104 mmol/L (ref 96–106)
Creatinine, Ser: 0.96 mg/dL (ref 0.57–1.00)
Glucose: 85 mg/dL (ref 70–99)
Potassium: 4.6 mmol/L (ref 3.5–5.2)
Sodium: 143 mmol/L (ref 134–144)
eGFR: 60 mL/min/{1.73_m2} (ref >59)

## 2021-11-16 LAB — CBC
Hematocrit: 38.2 % (ref 34.0–46.6)
Hemoglobin: 12.9 g/dL (ref 11.1–15.9)
MCH: 31.9 pg (ref 26.6–33.0)
MCHC: 33.8 g/dL (ref 31.5–35.7)
MCV: 94 fL (ref 79–97)
Platelets: 148 10*3/uL — ABNORMAL LOW (ref 150–450)
RBC: 4.05 x10E6/uL (ref 3.77–5.28)
RDW: 13.1 % (ref 11.7–15.4)
WBC: 4.6 10*3/uL (ref 3.4–10.8)

## 2021-11-16 NOTE — Progress Notes (Unsigned)
Enrolled for [employer] to mail a ZIO AT Live Telemetry monitor to patients address on file.  

## 2021-11-16 NOTE — Patient Instructions (Addendum)
Medication Instructions:  No Changes *If you need a refill on your cardiac medications before your next appointment, please call your pharmacy*   Lab Work: BMET, CBC . To Be Done Today. If you have labs (blood work) drawn today and your tests are completely normal, you will receive your results only by: [place] (if you have MyChart) OR A paper copy in the mail If you have any lab test that is abnormal or we need to change your treatment, we will call you to review the results.   Testing/Procedures: [address], [address]. Your physician has requested that you have an echocardiogram. Echocardiography is a painless test that uses sound waves to create images of your heart. It provides your doctor with information about the size and shape of your heart and how well your hearts chambers and valves are working. This procedure takes approximately one hour. There are no restrictions for this procedure.   Your physician has requested that you have cardiac CT. Cardiac computed tomography (CT) is a painless test that uses an x-ray machine to take clear, detailed pictures of your heart. For further information please visit [email or web address]. Please follow instruction sheet as given.   ZIO AT Long term monitor-Live Telemetry  Your physician has requested you wear a ZIO patch monitor for 14 days.  This is a single patch monitor. [employer] supplies one patch monitor per enrollment. Additional  stickers are not available.  Please do not apply patch if you will be having a Nuclear Stress Test, Echocardiogram, Cardiac CT, MRI,  or Chest Xray during the period you would be wearing the monitor. The patch cannot be worn during  these tests. You cannot remove and re-apply the ZIO AT patch monitor.  Your ZIO patch monitor will be mailed 3 day USPS to your address on file. It may take 3-5 days to  receive your monitor after you have been enrolled.  Once you have received your monitor,  please review the enclosed instructions. Your monitor has  already been registered assigning a specific monitor serial # to you.   Billing and Patient Assistance Program information  [name] has been supplied with any insurance information on record for billing. [employer] offers a sliding scale Patient Assistance Program for patients without insurance, or whose  insurance does not completely cover the cost of the ZIO patch monitor. You must apply for the  Patient Assistance Program to qualify for the discounted rate. To apply, call [employer] at [phone],  select option 4, select option 2 , ask to apply for the Patient Assistance Program, (you can request an  interpreter if needed). [employer] will ask your household income and how many people are in your  household. [employer] will quote your out-of-pocket cost based on this information. They will also be able  to set up a 12 month interest free payment plan if needed.  Applying the monitor   Shave hair from upper left chest.  Hold the abrader disc by orange tab. Rub the abrader in 40 strokes over left upper chest as indicated in  your monitor instructions.  Clean area with 4 enclosed alcohol pads. Use all pads to ensure the area is cleaned thoroughly. Let  dry.  Apply patch as indicated in monitor instructions. Patch will be placed under collarbone on left side of  chest with arrow pointing upward.  Rub patch adhesive wings for 2 minutes. Remove the white label marked "1". Remove the white label  marked "2". Rub patch adhesive wings  for 2 additional minutes.  While looking in a mirror, press and release button in center of patch. A small green light will flash 3-4  times. This will be your only indicator that the monitor has been turned on.  Do not shower for the first 24 hours. You may shower after the first 24 hours.  Press the button if you feel a symptom. You will hear a small click. Record Date, Time and Symptom in  the Patient Log.    Starting the Gateway  In your kit there is a Hydrographic surveyor box the size of a cellphone. This is Airline pilot. It transmits all your  recorded data to [facility]. This box must always stay within 10 feet of you. Open the box and push the *  button. There will be a light that blinks orange and then green a few times. When the light stops  blinking, the Gateway is connected to the ZIO patch. Call [employer] at [phone] to confirm your monitor is transmitting.  Returning your monitor  Remove your patch and place it inside the [place]. In the lower half of the Gateway there is a white  bag with prepaid postage on it. Place Gateway in bag and seal. Mail package back to [place] as soon as  possible. Your physician should have your final report approximately 7 days after you have mailed back  your monitor. Call [place] at [phone] if you have questions regarding your ZIO AT  patch monitor. Call them immediately if you see an orange light blinking on your monitor.  If your monitor falls off in less than 4 days, contact our Monitor department at [phone]. If your  monitor becomes loose or falls off after 4 days call [employer] at [phone] for suggestions on  securing your monitor  Follow-Up: At [facility], you and your health needs are our priority.  As part of our continuing mission to provide you with exceptional heart care, we have created designated Provider Care Teams.  These Care Teams include your primary Cardiologist (physician) and Advanced Practice Providers (APPs -  Physician Assistants and Nurse Practitioners) who all work together to provide you with the care you need, when you need it.      Your next appointment:   6 week(s)  The format for your next appointment:   In Person  Provider:   Caron Presume, PA-C    Then, Quay Burow, MD will plan to see you again in 3 month(s).      Your cardiac CT will be scheduled at one of the  below locations:   [facility] [address] [place], [place] [address] [phone]  If scheduled at [facility], please arrive at the [facility] main entrance (entrance A) of [facility] 30 minutes prior to test start time. You can use the FREE valet parking offered at the main entrance (encouraged to control the heart rate for the test) Proceed to the [facility] Radiology Department (first floor) to check-in and test prep.  If scheduled at [facility], please arrive 15 mins early for check-in and test prep.  Please follow these instructions carefully (unless otherwise directed):  Hold all erectile dysfunction medications at least 3 days (72 hrs) prior to test.  On the Night Before the Test: Be sure to Drink plenty of water. Do not consume any caffeinated/decaffeinated beverages or chocolate 12 hours prior to your test. Do not take any antihistamines 12 hours prior  to your test. If the patient has contrast allergy: Patient will need a prescription for Prednisone and very clear instructions (as follows): Prednisone 50 mg - take 13 hours prior to test Take another Prednisone 50 mg 7 hours prior to test Take another Prednisone 50 mg 1 hour prior to test Take Benadryl 50 mg 1 hour prior to test Patient must complete all four doses of above prophylactic medications. Patient will need a ride after test due to Benadryl.  On the Day of the Test: Drink plenty of water until 1 hour prior to the test. Do not eat any food 4 hours prior to the test. You may take your regular medications prior to the test.  Take metoprolol (Lopressor) two hours prior to test. HOLD Furosemide/Hydrochlorothiazide morning of the test. FEMALES- please wear underwire-free bra if available, avoid dresses & tight clothing   *For Clinical Staff only. Please instruct patient the following:* Heart Rate Medication Recommendations for Cardiac CT  Resting HR <  50 bpm  No medication  Resting HR 50-60 bpm and BP >110/50 mmHG   Consider Metoprolol tartrate 25 mg PO 90-120 min prior to scan  Resting HR 60-65 bpm and BP >110/50 mmHG  Metoprolol tartrate 50 mg PO 90-120 minutes prior to scan   Resting HR > 65 bpm and BP >110/50 mmHG  Metoprolol tartrate 100 mg PO 90-120 minutes prior to scan  Consider Ivabradine 10-15 mg PO or a calcium channel blocker for resting HR >60 bpm and contraindication to metoprolol tartrate  Consider Ivabradine 10-15 mg PO in combination with metoprolol tartrate for HR >80 bpm         After the Test: Drink plenty of water. After receiving IV contrast, you may experience a mild flushed feeling. This is normal. On occasion, you may experience a mild rash up to 24 hours after the test. This is not dangerous. If this occurs, you can take Benadryl 25 mg and increase your fluid intake. If you experience trouble breathing, this can be serious. If it is severe call 911 IMMEDIATELY. If it is mild, please call our office. If you take any of these medications: Glipizide/Metformin, Avandament, Glucavance, please do not take 48 hours after completing test unless otherwise instructed.  We will call to schedule your test 2-4 weeks out understanding that some insurance companies will need an authorization prior to the service being performed.   For non-scheduling related questions, please contact the cardiac imaging nurse navigator should you have any questions/concerns: Marchia Bond, Cardiac Imaging Nurse Navigator Gordy Clement, Cardiac Imaging Nurse Navigator [facility] Direct Office Dial: [phone]   For scheduling needs, including cancellations and rescheduling, please call [place], [phone].

## 2021-11-17 ENCOUNTER — Ambulatory Visit (HOSPITAL_COMMUNITY): Payer: Medicare Other | Attending: Cardiovascular Disease

## 2021-11-17 DIAGNOSIS — E782 Mixed hyperlipidemia: Secondary | ICD-10-CM

## 2021-11-17 DIAGNOSIS — R0609 Other forms of dyspnea: Secondary | ICD-10-CM

## 2021-11-17 DIAGNOSIS — I10 Essential (primary) hypertension: Secondary | ICD-10-CM | POA: Insufficient documentation

## 2021-11-17 DIAGNOSIS — R072 Precordial pain: Secondary | ICD-10-CM | POA: Insufficient documentation

## 2021-11-17 DIAGNOSIS — R079 Chest pain, unspecified: Secondary | ICD-10-CM | POA: Diagnosis not present

## 2021-11-17 DIAGNOSIS — R42 Dizziness and giddiness: Secondary | ICD-10-CM

## 2021-11-17 DIAGNOSIS — R001 Bradycardia, unspecified: Secondary | ICD-10-CM | POA: Diagnosis not present

## 2021-11-17 LAB — ECHOCARDIOGRAM COMPLETE
Area-P 1/2: 2.95 cm2
S' Lateral: 2.9 cm

## 2021-11-18 DIAGNOSIS — I10 Essential (primary) hypertension: Secondary | ICD-10-CM | POA: Diagnosis not present

## 2021-11-18 DIAGNOSIS — R072 Precordial pain: Secondary | ICD-10-CM | POA: Diagnosis not present

## 2021-11-18 DIAGNOSIS — R42 Dizziness and giddiness: Secondary | ICD-10-CM

## 2021-11-18 DIAGNOSIS — R001 Bradycardia, unspecified: Secondary | ICD-10-CM

## 2021-11-18 DIAGNOSIS — R079 Chest pain, unspecified: Secondary | ICD-10-CM

## 2021-11-18 DIAGNOSIS — R0609 Other forms of dyspnea: Secondary | ICD-10-CM | POA: Diagnosis not present

## 2021-11-18 DIAGNOSIS — E782 Mixed hyperlipidemia: Secondary | ICD-10-CM | POA: Diagnosis not present

## 2021-11-19 DIAGNOSIS — I10 Essential (primary) hypertension: Secondary | ICD-10-CM | POA: Diagnosis not present

## 2021-11-19 DIAGNOSIS — R072 Precordial pain: Secondary | ICD-10-CM | POA: Diagnosis not present

## 2021-11-19 DIAGNOSIS — E782 Mixed hyperlipidemia: Secondary | ICD-10-CM | POA: Diagnosis not present

## 2021-11-19 DIAGNOSIS — R001 Bradycardia, unspecified: Secondary | ICD-10-CM | POA: Diagnosis not present

## 2021-11-23 ENCOUNTER — Telehealth: Payer: Self-pay

## 2021-11-23 NOTE — Telephone Encounter (Addendum)
Called patient regarding results of echocardiogram. Patient had understanding o results.[name]----- Message from Warren Lacy, PA-C sent at 11/21/2021 10:13 PM EST ----- Echo done with complaints of shortness of breath with exertion.  Heart squeeze normal with ejection fraction 60 to 65%.  Mild left ventricular hypertrophy.  Grade 1 diastolic dysfunction.  Normal right ventricular systolic function.  No significant valve disease.  Overall, no remarkable finding on echo to explain shortness of breath.

## 2021-11-23 NOTE — Telephone Encounter (Addendum)
Called patient regarding results. Patient had understanding of results.----- Message from Warren Lacy, PA-C sent at 11/21/2021 10:11 PM EST ----- Blood counts and metabolic panel (kidney function and electrolytes) are normal.

## 2021-11-28 ENCOUNTER — Ambulatory Visit (HOSPITAL_COMMUNITY): Payer: Medicare Other

## 2021-11-28 DIAGNOSIS — R0609 Other forms of dyspnea: Secondary | ICD-10-CM | POA: Diagnosis not present

## 2021-11-28 DIAGNOSIS — R55 Syncope and collapse: Secondary | ICD-10-CM | POA: Diagnosis not present

## 2021-11-28 DIAGNOSIS — Z95818 Presence of other cardiac implants and grafts: Secondary | ICD-10-CM

## 2021-11-28 DIAGNOSIS — I10 Essential (primary) hypertension: Secondary | ICD-10-CM | POA: Diagnosis not present

## 2021-11-28 HISTORY — DX: Presence of other cardiac implants and grafts: Z95.818

## 2021-12-01 ENCOUNTER — Telehealth (HOSPITAL_COMMUNITY): Payer: Self-pay | Admitting: *Deleted

## 2021-12-01 NOTE — Telephone Encounter (Signed)
Attempted to call patient regarding upcoming cardiac CT appointment. °Left message on voicemail with name and callback number ° °Daylen Hack RN Navigator Cardiac Imaging °[facility] °[phone] Office °[phone] Cell ° °

## 2021-12-04 ENCOUNTER — Ambulatory Visit (HOSPITAL_COMMUNITY)
Admission: RE | Admit: 2021-12-04 | Discharge: 2021-12-04 | Disposition: A | Discharge status: Home / Self Care | Payer: Medicare Other | Source: Ambulatory Visit | Attending: Physician Assistant | Admitting: Physician Assistant

## 2021-12-04 ENCOUNTER — Other Ambulatory Visit: Payer: Self-pay

## 2021-12-04 ENCOUNTER — Encounter (HOSPITAL_COMMUNITY): Payer: Self-pay

## 2021-12-04 DIAGNOSIS — R079 Chest pain, unspecified: Secondary | ICD-10-CM | POA: Insufficient documentation

## 2021-12-04 DIAGNOSIS — I209 Angina pectoris, unspecified: Secondary | ICD-10-CM | POA: Insufficient documentation

## 2021-12-04 MED ORDER — IOHEXOL 350 MG/ML SOLN
95.0000 mL | Freq: Once | INTRAVENOUS | Status: AC | PRN
  Administered 2021-12-04: 95 mL via INTRAVENOUS

## 2021-12-04 MED ORDER — NITROGLYCERIN 0.4 MG SL SUBL
SUBLINGUAL_TABLET | SUBLINGUAL | Status: AC
  Filled 2021-12-04: qty 2

## 2021-12-04 MED ORDER — NITROGLYCERIN 0.4 MG SL SUBL
0.8000 mg | SUBLINGUAL_TABLET | Freq: Once | SUBLINGUAL | Status: AC
  Administered 2021-12-04: 0.8 mg via SUBLINGUAL

## 2021-12-06 ENCOUNTER — Telehealth: Payer: Self-pay

## 2021-12-06 DIAGNOSIS — E113212 Type 2 diabetes mellitus with mild nonproliferative diabetic retinopathy with macular edema, left eye: Secondary | ICD-10-CM | POA: Diagnosis not present

## 2021-12-06 DIAGNOSIS — H43823 Vitreomacular adhesion, bilateral: Secondary | ICD-10-CM | POA: Diagnosis not present

## 2021-12-06 DIAGNOSIS — H43393 Other vitreous opacities, bilateral: Secondary | ICD-10-CM | POA: Diagnosis not present

## 2021-12-06 DIAGNOSIS — E113291 Type 2 diabetes mellitus with mild nonproliferative diabetic retinopathy without macular edema, right eye: Secondary | ICD-10-CM | POA: Diagnosis not present

## 2021-12-06 NOTE — Progress Notes (Signed)
Reviewed and agree with documentation and assessment and plan. K. Veena Lorriann Hansmann , MD   

## 2021-12-06 NOTE — Telephone Encounter (Addendum)
Called patient regarding results. Left message for patient to call office.----- Message from Warren Lacy, PA-C sent at 12/05/2021  2:06 PM EST ----- CTA coronaries showed: 1. Calcium score 0 2.  Normal coronary arteries 3.  Normal ascending thoracic aorta. This is fantastic news, especially for any 81 year old. You have no coronary artery disease.

## 2021-12-07 ENCOUNTER — Telehealth: Payer: Self-pay

## 2021-12-07 NOTE — Telephone Encounter (Addendum)
Patient returned call regarding results. Patient had understanding of results.----- Message from Warren Lacy, PA-C sent at 12/05/2021  2:06 PM EST ----- CTA coronaries showed: 1. Calcium score 0 2.  Normal coronary arteries 3.  Normal ascending thoracic aorta. This is fantastic news, especially for any 81 year old. You have no coronary artery disease.

## 2021-12-10 DIAGNOSIS — Z20822 Contact with and (suspected) exposure to covid-19: Secondary | ICD-10-CM | POA: Diagnosis not present

## 2021-12-27 DIAGNOSIS — I10 Essential (primary) hypertension: Secondary | ICD-10-CM | POA: Diagnosis not present

## 2021-12-27 DIAGNOSIS — J309 Allergic rhinitis, unspecified: Secondary | ICD-10-CM | POA: Diagnosis not present

## 2021-12-27 DIAGNOSIS — E782 Mixed hyperlipidemia: Secondary | ICD-10-CM | POA: Diagnosis not present

## 2021-12-27 DIAGNOSIS — M545 Low back pain, unspecified: Secondary | ICD-10-CM | POA: Diagnosis not present

## 2021-12-28 DIAGNOSIS — M7061 Trochanteric bursitis, right hip: Secondary | ICD-10-CM | POA: Diagnosis not present

## 2022-01-02 NOTE — Progress Notes (Signed)
?Cardiology Office Note:   ? ?Date:  01/03/2022  ? ?ID:  [name], DOB 1940-12-01, MRN [id number] ? ?PCP:  Fanny Bien, MD ?[place] Cardiologist: Quay Burow, MD  ? ?Reason for visit: 6 week follow-up ? ?History of Present Illness:   ? ?[name] is a 81 y.o. female with a hx of hyperlipidemia, depression, fibromyalgia, esophageal dysmotility, bradycardia, sleep apnea on CPAP.  She was admitted October 2021 with chest pain and shortness of breath.  2D echo was unremarkable and chest CTA showed no evidence of pulmonary embolism.  She went to the ED in January 2023 and was presyncopal; thought likely to have a viral syndrome. ? ?She saw me on November 16, 2021 and complained of persistent lightheadedness and weakness.  Blood pressure was running 94/51.  Her PCP recommended halving her losartan.  She also complained about dyspnea on moderate exertion.  She has chronic intermittent chest pain she thinks related to hiatal hernia.  Dr. Kipp Brood tried to repair this however had abort the procedure last year.  This chest pain radiates to her neck, not exertional and she tries Carafate to make it feel better.   ? ?Cardiac work-up ordered 11/2032: ?-Zio patch x2 weeks showing normal sinus rhythm without significant arrhythmia.   ?-CT of the coronaries showed normal coronaries with calcium score 0. ?-2D echo with EF 60-65%, mild LVH, grade 1 diastolic dysfunction, normal RV and no significant valve disease. ? ?Today, she has no concerns.  She denies lightheadedness recently.  Her daughter has encouraged her to stay hydrated.  Her blood pressure is high in the office.  She states blood pressure at home sometimes systolics 967, 591 up to 638G.  She says it is up-and-down.  She has stable dyspnea on moderate exertion. ? ? ?  ?Past Medical History:  ?Diagnosis Date  ? Allergy   ? Anal or rectal pain   ? sometimes  ? Anemia   ? hx of during pregnancy  ? Anxiety   ? Arthritis   ? Asthma   ? hx of  ?  Bradycardia   ? " I KNOW I HAVE BRADYCARDIA ESPECIALLY WHEN I SLEEP"   ? Cataract 2021  ? bilateral eyes  ? Chronic back pain   ? Degenerative joint disease   ? osteo  ? Depression   ? Diabetes mellitus without complication ([place])   ? DM type II  ? Diverticulosis 2003  ? Dysrhythmia   ? hx of  due to eye drop and also low heart rate 40's per pt. Dr. Gwenlyn Found follows  ? Elevated total protein   ? Esophageal dysmotility   ? Fibromyalgia   ? GERD (gastroesophageal reflux disease)   ? subsequent Nissen Fundoplication  ? Glaucoma   ? Hearing loss   ? Heart murmur   ? "was told she had a heart murmur"  ? Hemorrhoids   ? Hiatal hernia 11/08/09  ? Hx of adenomatous colonic polyps 07/02/02  ? Hypercalcemia   ? Hyperlipidemia   ? Hyperlipidemia   ? Hypertension   ? Nausea   ? Osteoporosis   ? PONV (postoperative nausea and vomiting)   ? Rectal bleeding   ? from hemorrhoids.    ? Sleep apnea   ? DOES USE CPAP   ? Thrombocytopenia ([place])   ? Varicose veins of left lower extremity   ? ? ?Past Surgical History:  ?Procedure Laterality Date  ? CARDIAC CATHETERIZATION  03/14/1992  ? Normal cardiac cath. Normal  LV function.  ? CARDIOVASCULAR STRESS TEST  01/22/2011  ? No scintigraphic evidence of inducible ischemia.  ? CAROTID DOPPLER  03/31/2007  ? Bilateral ICAs - no evidence of significant diameter reduction, dissectin, tortuosity, FMD, or any other vascular abnormality.  ? CATARACT EXTRACTION, BILATERAL    ? ESOPHAGEAL MANOMETRY N/A 11/14/2015  ? Procedure: ESOPHAGEAL MANOMETRY (EM);  Surgeon: Mauri Pole, MD;  Location: [facility];  Service: Endoscopy;  Laterality: N/A;  ? ESOPHAGEAL MANOMETRY N/A 01/18/2021  ? Procedure: ESOPHAGEAL MANOMETRY (EM);  Surgeon: Mauri Pole, MD;  Location: [facility];  Service: Endoscopy;  Laterality: N/A;  ? ESOPHAGOGASTRODUODENOSCOPY N/A 03/08/2021  ? Procedure: ESOPHAGOGASTRODUODENOSCOPY (EGD);  Surgeon: Lajuana Matte, MD;  Location: [place];  Service: Thoracic;  Laterality: N/A;  ? EYE  SURGERY    ? bilateral cataracts; bilateral stents  ? GASTRIC RESECTION  2009  ? GLAUCOMA SURGERY    ? JOINT REPLACEMENT    ? right knee Dr. Wynelle Link 06-23-18  ? KNEE SURGERY Bilateral   ? NISSEN FUNDOPLICATION  6720  ? with subsequent takedown in 2009  ? TOTAL KNEE ARTHROPLASTY Left 06/10/2017  ? Procedure: LEFT  TOTAL KNEE ARTHROPLASTY;  Surgeon: Gaynelle Arabian, MD;  Location: [facility];  Service: Orthopedics;  Laterality: Left;  Adductor Block  ? TOTAL KNEE ARTHROPLASTY Right 06/23/2018  ? Procedure: RIGHT TOTAL KNEE ARTHROPLASTY;  Surgeon: Gaynelle Arabian, MD;  Location: [facility];  Service: Orthopedics;  Laterality: Right;  ? TRANSTHORACIC ECHOCARDIOGRAM  12/21/2010  ? EF 60%, moderate LVH,   ? TUBAL LIGATION    ? XI ROBOTIC ASSISTED HIATAL HERNIA REPAIR N/A 03/08/2021  ? Procedure: XI ROBOTIC ASSISTED LAPAROSCOPY WITH LYSIS OF ADHESIONS;  Surgeon: Lajuana Matte, MD;  Location: [place];  Service: Thoracic;  Laterality: N/A;  ? ? ?Current Medications: ?Current Meds  ?Medication Sig  ? brimonidine (ALPHAGAN) 0.15 % ophthalmic solution Place 1 drop into both eyes in the morning and at bedtime.  ? cetirizine (ZYRTEC) 10 MG tablet Take 10 mg by mouth at bedtime.  ? Cholecalciferol (VITAMIN D3) 250 MCG (10000 UT) capsule Take 10,000 Units by mouth daily.  ? dexlansoprazole (DEXILANT) 60 MG capsule Take 1 capsule (60 mg total) by mouth daily.  ? FLUoxetine (PROZAC) 40 MG capsule Take 40 mg by mouth daily.  ? losartan (COZAAR) 50 MG tablet Take 50 mg by mouth daily.   ? montelukast (SINGULAIR) 10 MG tablet Take 10 mg by mouth at bedtime.  ? nystatin (MYCOSTATIN/NYSTOP) powder Apply 1 g topically daily as needed (for rash).   ? nystatin ointment (MYCOSTATIN) Apply 1 application topically 2 (two) times daily as needed (for rash).   ? ondansetron (ZOFRAN) 4 MG tablet Take 1 tablet (4 mg total) by mouth every 4 (four) hours as needed for nausea or vomiting.  ? Polyethyl Glycol-Propyl Glycol (SYSTANE OP) Place 1 drop into both eyes  daily as needed (dry/irritated eyes).  ? rosuvastatin (CRESTOR) 40 MG tablet Take 40 mg by mouth at bedtime.  ? sucralfate (CARAFATE) 1 g tablet Take 1 tablet (1 g total) by mouth 4 (four) times daily -  with meals and at bedtime.  ?  ? ?Allergies:   Adalat [nifedipine], Hydrocodone, Meperidine hcl, and Naproxen  ? ?Social History  ? ?Socioeconomic History  ? Marital status: Married  ?  Spouse name: Not on file  ? Number of children: 8  ? Years of education: Not on file  ? Highest education level: Not on file  ?Occupational History  ?  Occupation: retired  ?  Comment: retired Therapist, art.   ?Tobacco Use  ? Smoking status: Never  ? Smokeless tobacco: Never  ?Vaping Use  ? Vaping Use: Never used  ?Substance and Sexual Activity  ? Alcohol use: No  ?  Alcohol/week: 0.0 standard drinks  ? Drug use: No  ? Sexual activity: Yes  ?Other Topics Concern  ? Not on file  ?Social History Narrative  ? Husband, Briannah Lona is Next of Kin. Cell # [phone]  ? ?Social Determinants of Health  ? ?Financial Resource Strain: Not on file  ?Food Insecurity: Not on file  ?Transportation Needs: Not on file  ?Physical Activity: Not on file  ?Stress: Not on file  ?Social Connections: Not on file  ?  ? ?Family History: ?The patient's family history includes Breast cancer in her daughter; Cancer in her maternal grandmother; Diabetes in her father and mother; Heart disease in her mother; Hypertension in her mother; Kidney disease in her father and maternal grandfather; Leukemia in her maternal uncle; Prostate cancer in her maternal uncle; Stomach cancer in her maternal aunt; Stroke in her brother; Uterine cancer in her maternal aunt. There is no history of Colon cancer, Rectal cancer, or Esophageal cancer. ? ?ROS:   ?Please see the history of present illness.    ? ?EKGs/Labs/Other Studies Reviewed:   ? ?Recent Labs: ?10/26/2021: ALT 10; B Natriuretic Peptide 57.0 ?11/16/2021: BUN 17; Creatinine, Ser 0.96; Hemoglobin 12.9; Platelets 148;  Potassium 4.6; Sodium 143  ? ?Recent Lipid Panel ?Lab Results  ?Component Value Date/Time  ? CHOL 200 (H) 10/26/2020 09:42 AM  ? TRIG 46 10/26/2020 09:42 AM  ? HDL 76 10/26/2020 09:42 AM  ? LDLCALC 115 (H) 10/26/2020

## 2022-01-03 ENCOUNTER — Ambulatory Visit (INDEPENDENT_AMBULATORY_CARE_PROVIDER_SITE_OTHER): Payer: Medicare Other | Admitting: Physician Assistant

## 2022-01-03 ENCOUNTER — Other Ambulatory Visit: Payer: Self-pay

## 2022-01-03 ENCOUNTER — Encounter: Payer: Self-pay | Admitting: Physician Assistant

## 2022-01-03 VITALS — BP 188/77 | HR 56 | Ht 70.5 in | Wt 208.8 lb

## 2022-01-03 DIAGNOSIS — R42 Dizziness and giddiness: Secondary | ICD-10-CM

## 2022-01-03 DIAGNOSIS — R0609 Other forms of dyspnea: Secondary | ICD-10-CM

## 2022-01-03 DIAGNOSIS — R072 Precordial pain: Secondary | ICD-10-CM

## 2022-01-03 DIAGNOSIS — I10 Essential (primary) hypertension: Secondary | ICD-10-CM | POA: Diagnosis not present

## 2022-01-03 NOTE — Patient Instructions (Signed)
Medication Instructions:  ?No Changes ?*If you need a refill on your cardiac medications before your next appointment, please call your pharmacy* ? ? ?Lab Work: ?No Labs ?If you have labs (blood work) drawn today and your tests are completely normal, you will receive your results only by: ?MyChart Message (if you have MyChart) OR ?A paper copy in the mail ?If you have any lab test that is abnormal or we need to change your treatment, we will call you to review the results. ? ? ?Testing/Procedures: ?No Testing ? ? ?Follow-Up: ?At [facility], you and your health needs are our priority.  As part of our continuing mission to provide you with exceptional heart care, we have created designated Provider Care Teams.  These Care Teams include your primary Cardiologist (physician) and Advanced Practice Providers (APPs -  Physician Assistants and Nurse Practitioners) who all work together to provide you with the care you need, when you need it. ? ?We recommend signing up for the patient portal called "MyChart".  Sign up information is provided on this After Visit Summary.  MyChart is used to connect with patients for Virtual Visits (Telemedicine).  Patients are able to view lab/test results, encounter notes, upcoming appointments, etc.  Non-urgent messages can be sent to your provider as well.   ?To learn more about what you can do with MyChart, go to [email or web address].   ? ?Your next appointment:   ?1 year(s) ? ?The format for your next appointment:   ?In Person ? ?Provider:   ?Quay Burow, MD   ? ? ?  ?

## 2022-01-10 DIAGNOSIS — Z20822 Contact with and (suspected) exposure to covid-19: Secondary | ICD-10-CM | POA: Diagnosis not present

## 2022-01-12 DIAGNOSIS — H43393 Other vitreous opacities, bilateral: Secondary | ICD-10-CM | POA: Diagnosis not present

## 2022-01-12 DIAGNOSIS — E113212 Type 2 diabetes mellitus with mild nonproliferative diabetic retinopathy with macular edema, left eye: Secondary | ICD-10-CM | POA: Diagnosis not present

## 2022-01-12 DIAGNOSIS — H43823 Vitreomacular adhesion, bilateral: Secondary | ICD-10-CM | POA: Diagnosis not present

## 2022-01-12 DIAGNOSIS — E113291 Type 2 diabetes mellitus with mild nonproliferative diabetic retinopathy without macular edema, right eye: Secondary | ICD-10-CM | POA: Diagnosis not present

## 2022-01-17 DIAGNOSIS — H35372 Puckering of macula, left eye: Secondary | ICD-10-CM | POA: Diagnosis not present

## 2022-01-17 DIAGNOSIS — E113212 Type 2 diabetes mellitus with mild nonproliferative diabetic retinopathy with macular edema, left eye: Secondary | ICD-10-CM | POA: Diagnosis not present

## 2022-01-17 DIAGNOSIS — H401113 Primary open-angle glaucoma, right eye, severe stage: Secondary | ICD-10-CM | POA: Diagnosis not present

## 2022-01-17 DIAGNOSIS — H18513 Endothelial corneal dystrophy, bilateral: Secondary | ICD-10-CM | POA: Diagnosis not present

## 2022-01-29 DIAGNOSIS — J069 Acute upper respiratory infection, unspecified: Secondary | ICD-10-CM | POA: Diagnosis not present

## 2022-01-29 DIAGNOSIS — B9689 Other specified bacterial agents as the cause of diseases classified elsewhere: Secondary | ICD-10-CM | POA: Diagnosis not present

## 2022-01-29 DIAGNOSIS — J329 Chronic sinusitis, unspecified: Secondary | ICD-10-CM | POA: Diagnosis not present

## 2022-02-10 DIAGNOSIS — Z20822 Contact with and (suspected) exposure to covid-19: Secondary | ICD-10-CM | POA: Diagnosis not present

## 2022-02-12 DIAGNOSIS — E782 Mixed hyperlipidemia: Secondary | ICD-10-CM | POA: Diagnosis not present

## 2022-02-12 DIAGNOSIS — E1165 Type 2 diabetes mellitus with hyperglycemia: Secondary | ICD-10-CM | POA: Diagnosis not present

## 2022-02-14 ENCOUNTER — Ambulatory Visit: Payer: Medicare Other | Admitting: Cardiovascular Disease

## 2022-02-14 DIAGNOSIS — E113291 Type 2 diabetes mellitus with mild nonproliferative diabetic retinopathy without macular edema, right eye: Secondary | ICD-10-CM | POA: Diagnosis not present

## 2022-02-14 DIAGNOSIS — E113212 Type 2 diabetes mellitus with mild nonproliferative diabetic retinopathy with macular edema, left eye: Secondary | ICD-10-CM | POA: Diagnosis not present

## 2022-02-14 DIAGNOSIS — H59032 Cystoid macular edema following cataract surgery, left eye: Secondary | ICD-10-CM | POA: Diagnosis not present

## 2022-02-14 DIAGNOSIS — H401133 Primary open-angle glaucoma, bilateral, severe stage: Secondary | ICD-10-CM | POA: Diagnosis not present

## 2022-02-14 DIAGNOSIS — H43393 Other vitreous opacities, bilateral: Secondary | ICD-10-CM | POA: Diagnosis not present

## 2022-02-15 DIAGNOSIS — E1121 Type 2 diabetes mellitus with diabetic nephropathy: Secondary | ICD-10-CM | POA: Diagnosis not present

## 2022-02-15 DIAGNOSIS — Z20822 Contact with and (suspected) exposure to covid-19: Secondary | ICD-10-CM | POA: Diagnosis not present

## 2022-02-15 DIAGNOSIS — I10 Essential (primary) hypertension: Secondary | ICD-10-CM | POA: Diagnosis not present

## 2022-02-15 DIAGNOSIS — E782 Mixed hyperlipidemia: Secondary | ICD-10-CM | POA: Diagnosis not present

## 2022-02-15 DIAGNOSIS — E1165 Type 2 diabetes mellitus with hyperglycemia: Secondary | ICD-10-CM | POA: Diagnosis not present

## 2022-02-15 DIAGNOSIS — Z23 Encounter for immunization: Secondary | ICD-10-CM | POA: Diagnosis not present

## 2022-02-16 DIAGNOSIS — Z20822 Contact with and (suspected) exposure to covid-19: Secondary | ICD-10-CM | POA: Diagnosis not present

## 2022-02-19 DIAGNOSIS — Z20822 Contact with and (suspected) exposure to covid-19: Secondary | ICD-10-CM | POA: Diagnosis not present

## 2022-03-29 DIAGNOSIS — M7542 Impingement syndrome of left shoulder: Secondary | ICD-10-CM | POA: Diagnosis not present

## 2022-03-29 DIAGNOSIS — M7541 Impingement syndrome of right shoulder: Secondary | ICD-10-CM | POA: Diagnosis not present

## 2022-03-29 DIAGNOSIS — G4700 Insomnia, unspecified: Secondary | ICD-10-CM | POA: Diagnosis not present

## 2022-03-29 DIAGNOSIS — F411 Generalized anxiety disorder: Secondary | ICD-10-CM | POA: Diagnosis not present

## 2022-03-29 DIAGNOSIS — F331 Major depressive disorder, recurrent, moderate: Secondary | ICD-10-CM | POA: Diagnosis not present

## 2022-03-29 DIAGNOSIS — I10 Essential (primary) hypertension: Secondary | ICD-10-CM | POA: Diagnosis not present

## 2022-04-03 IMAGING — MG MM DIGITAL DIAGNOSTIC UNILAT*L* W/ TOMO W/ CAD
4 series · 4 of 12 positions shown · non-contrast
Comparison: Previous exam(s).

CLINICAL DATA: Patient was recalled from screening mammogram for a
possible asymmetry in the left breast.

EXAM:
DIGITAL DIAGNOSTIC UNILATERAL LEFT MAMMOGRAM WITH TOMO AND CAD
TECHNIQUE: Left digital diagnostic mammography and breast tomosynthesis was
performed. Digital images of the breasts were evaluated with
computer-aided detection.

[L ML synth-2D]
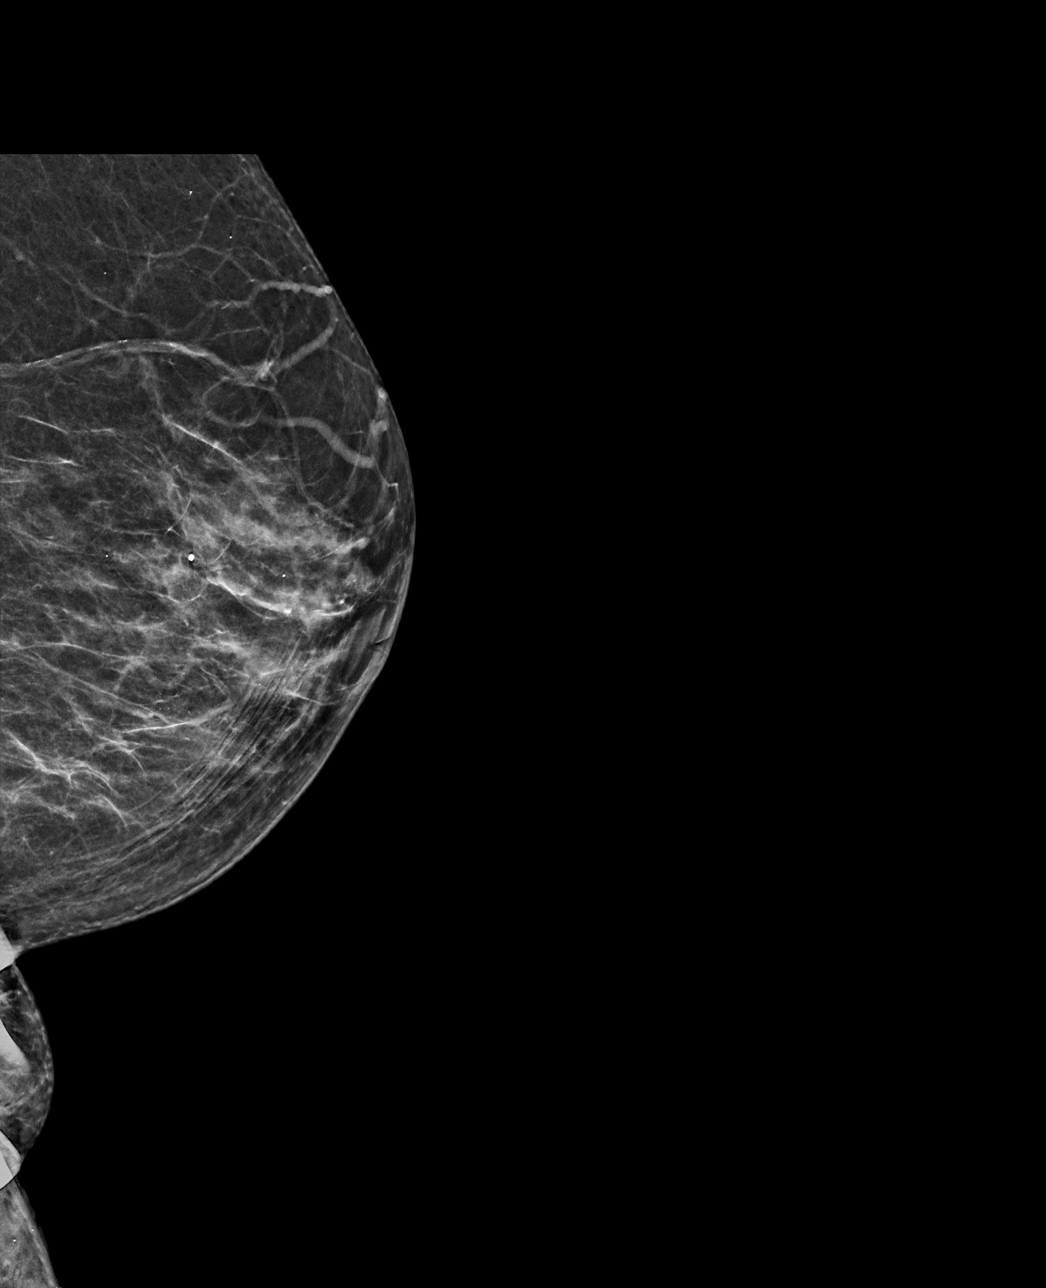

[L MLO synth-2D]
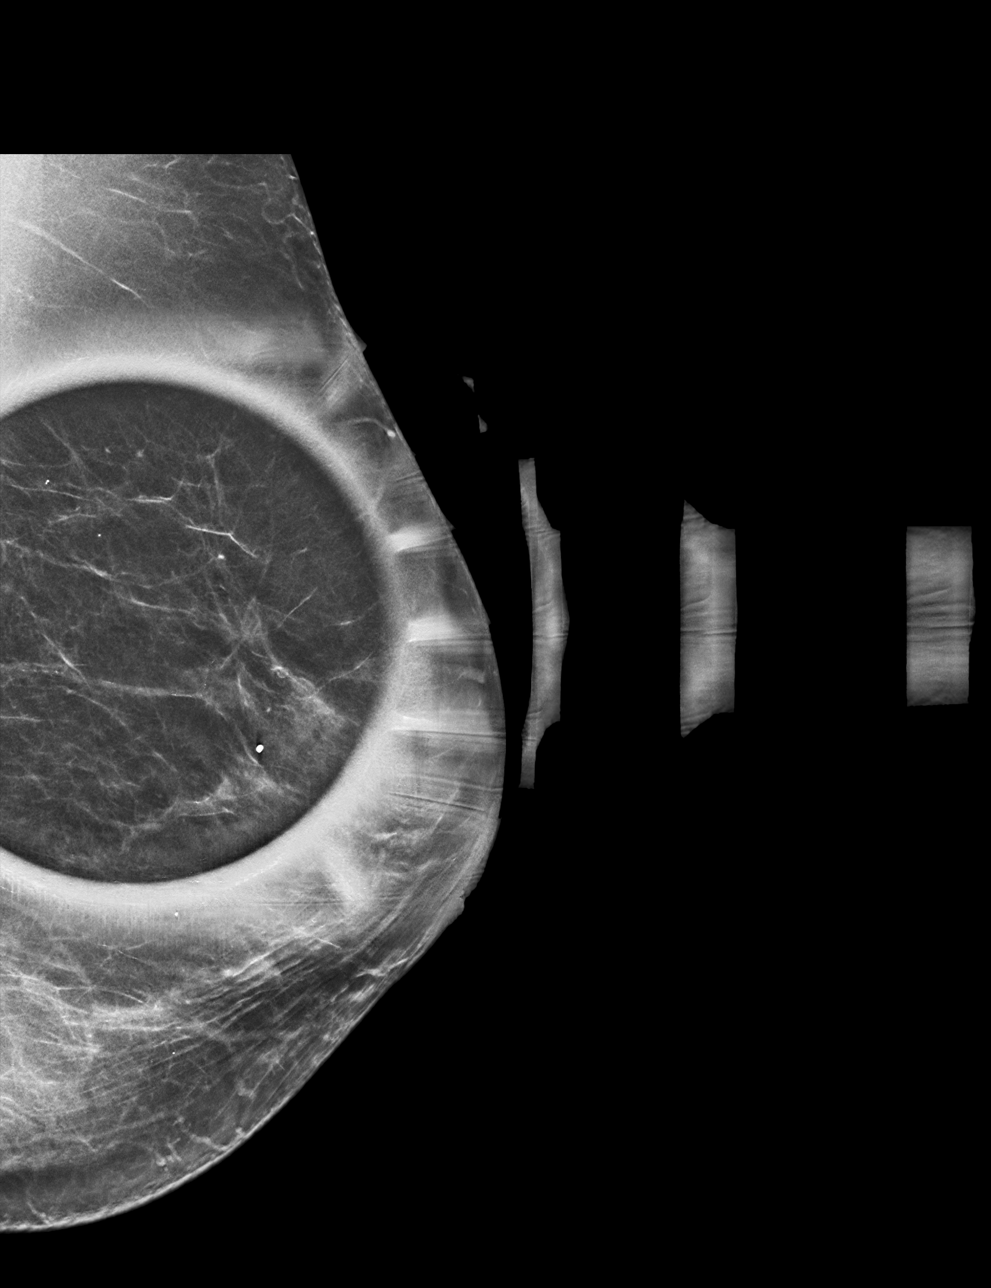

[L ML tomo · tomo slice 31/60.0]
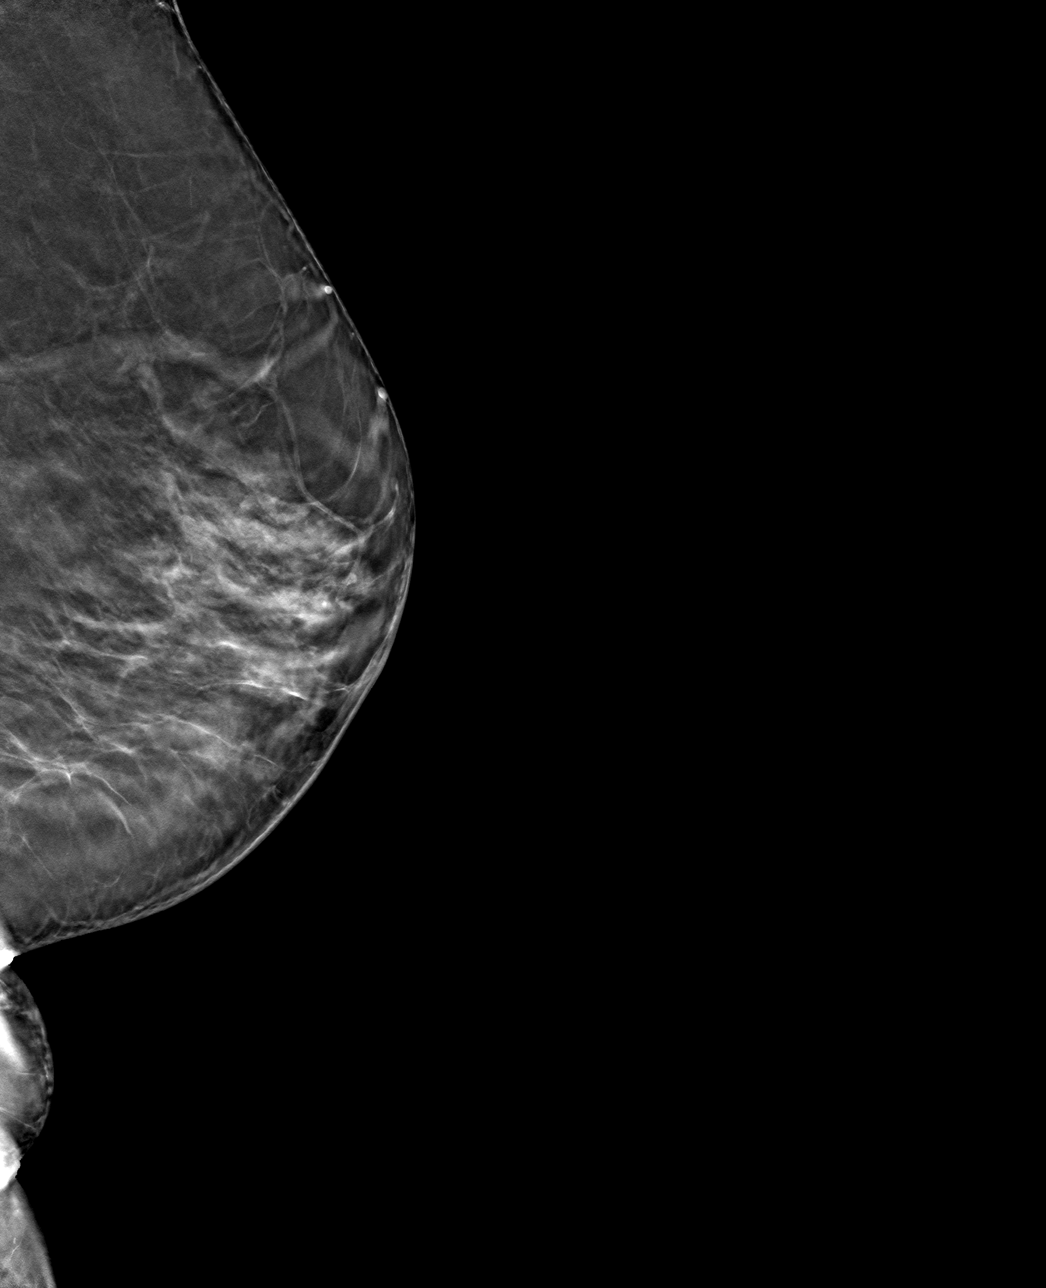

[L MLO tomo · tomo slice 27/54.0]
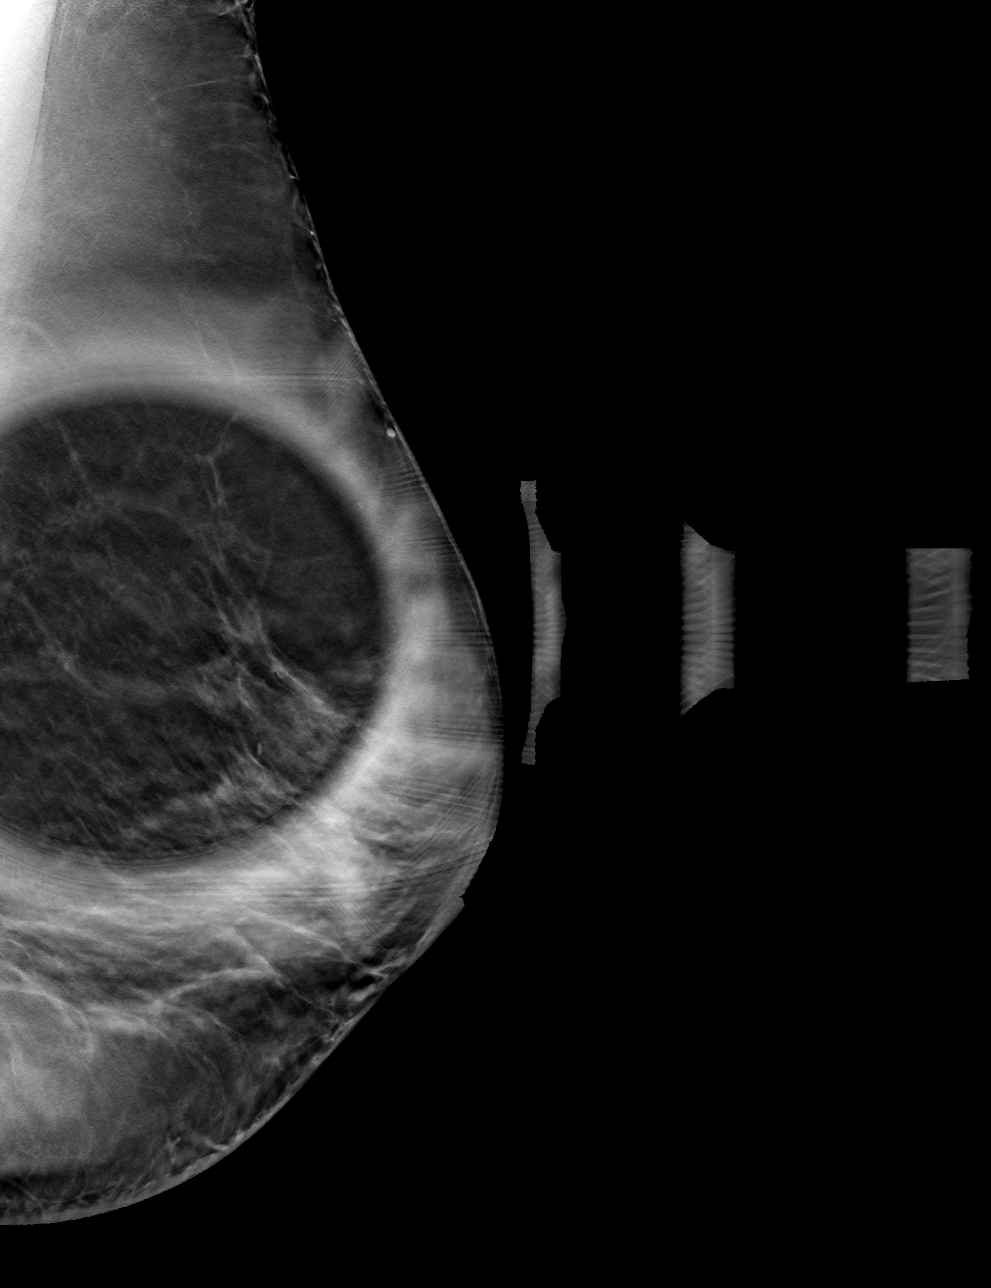

[4 of 12 positions shown; findings below may reference images not displayed]

ACR Breast Density Category b: There are scattered areas of
fibroglandular density.
FINDINGS: Additional imaging of the left breast was performed. No persistent
mass, distortion or malignant type microcalcifications identified.
IMPRESSION: No evidence of malignancy in the left breast.

RECOMMENDATION:
Bilateral screening mammogram in Tuesday September, 2021 is recommended.

I have discussed the findings and recommendations with the patient.
If applicable, a reminder letter will be sent to the patient
regarding the next appointment.

BI-RADS CATEGORY  1: Negative.

## 2022-05-08 DIAGNOSIS — F331 Major depressive disorder, recurrent, moderate: Secondary | ICD-10-CM | POA: Diagnosis not present

## 2022-05-08 DIAGNOSIS — G4700 Insomnia, unspecified: Secondary | ICD-10-CM | POA: Diagnosis not present

## 2022-05-08 DIAGNOSIS — I10 Essential (primary) hypertension: Secondary | ICD-10-CM | POA: Diagnosis not present

## 2022-05-08 DIAGNOSIS — M549 Dorsalgia, unspecified: Secondary | ICD-10-CM | POA: Diagnosis not present

## 2022-05-08 DIAGNOSIS — F411 Generalized anxiety disorder: Secondary | ICD-10-CM | POA: Diagnosis not present

## 2022-05-09 DIAGNOSIS — Z1339 Encounter for screening examination for other mental health and behavioral disorders: Secondary | ICD-10-CM | POA: Diagnosis not present

## 2022-05-09 DIAGNOSIS — Z1331 Encounter for screening for depression: Secondary | ICD-10-CM | POA: Diagnosis not present

## 2022-05-09 DIAGNOSIS — Z0000 Encounter for general adult medical examination without abnormal findings: Secondary | ICD-10-CM | POA: Diagnosis not present

## 2022-05-16 DIAGNOSIS — E1165 Type 2 diabetes mellitus with hyperglycemia: Secondary | ICD-10-CM | POA: Diagnosis not present

## 2022-05-18 DIAGNOSIS — H59032 Cystoid macular edema following cataract surgery, left eye: Secondary | ICD-10-CM | POA: Diagnosis not present

## 2022-05-18 DIAGNOSIS — E113291 Type 2 diabetes mellitus with mild nonproliferative diabetic retinopathy without macular edema, right eye: Secondary | ICD-10-CM | POA: Diagnosis not present

## 2022-05-18 DIAGNOSIS — H401133 Primary open-angle glaucoma, bilateral, severe stage: Secondary | ICD-10-CM | POA: Diagnosis not present

## 2022-05-18 DIAGNOSIS — E113212 Type 2 diabetes mellitus with mild nonproliferative diabetic retinopathy with macular edema, left eye: Secondary | ICD-10-CM | POA: Diagnosis not present

## 2022-05-21 ENCOUNTER — Encounter (HOSPITAL_BASED_OUTPATIENT_CLINIC_OR_DEPARTMENT_OTHER): Payer: Self-pay | Admitting: Radiology

## 2022-05-21 ENCOUNTER — Encounter (HOSPITAL_COMMUNITY): Payer: Self-pay

## 2022-05-21 ENCOUNTER — Other Ambulatory Visit: Payer: Self-pay

## 2022-05-21 ENCOUNTER — Observation Stay (HOSPITAL_BASED_OUTPATIENT_CLINIC_OR_DEPARTMENT_OTHER)
Admission: EM | Admit: 2022-05-21 | Discharge: 2022-05-22 | Disposition: A | Discharge status: Home / Self Care | Payer: Medicare Other | Attending: Internal Medicine | Admitting: Internal Medicine

## 2022-05-21 ENCOUNTER — Emergency Department (HOSPITAL_BASED_OUTPATIENT_CLINIC_OR_DEPARTMENT_OTHER): Payer: Medicare Other | Admitting: Radiology

## 2022-05-21 ENCOUNTER — Telehealth: Payer: Self-pay | Admitting: Cardiovascular Disease

## 2022-05-21 ENCOUNTER — Emergency Department (HOSPITAL_BASED_OUTPATIENT_CLINIC_OR_DEPARTMENT_OTHER): Payer: Medicare Other

## 2022-05-21 DIAGNOSIS — R5383 Other fatigue: Secondary | ICD-10-CM | POA: Diagnosis not present

## 2022-05-21 DIAGNOSIS — G4733 Obstructive sleep apnea (adult) (pediatric): Secondary | ICD-10-CM | POA: Insufficient documentation

## 2022-05-21 DIAGNOSIS — R079 Chest pain, unspecified: Secondary | ICD-10-CM

## 2022-05-21 DIAGNOSIS — I10 Essential (primary) hypertension: Secondary | ICD-10-CM | POA: Diagnosis present

## 2022-05-21 DIAGNOSIS — E785 Hyperlipidemia, unspecified: Secondary | ICD-10-CM | POA: Diagnosis present

## 2022-05-21 DIAGNOSIS — I129 Hypertensive chronic kidney disease with stage 1 through stage 4 chronic kidney disease, or unspecified chronic kidney disease: Secondary | ICD-10-CM | POA: Diagnosis not present

## 2022-05-21 DIAGNOSIS — R0609 Other forms of dyspnea: Principal | ICD-10-CM | POA: Diagnosis present

## 2022-05-21 DIAGNOSIS — E1122 Type 2 diabetes mellitus with diabetic chronic kidney disease: Secondary | ICD-10-CM | POA: Diagnosis not present

## 2022-05-21 DIAGNOSIS — R0789 Other chest pain: Secondary | ICD-10-CM | POA: Diagnosis not present

## 2022-05-21 DIAGNOSIS — Z79899 Other long term (current) drug therapy: Secondary | ICD-10-CM | POA: Insufficient documentation

## 2022-05-21 DIAGNOSIS — N1831 Chronic kidney disease, stage 3a: Secondary | ICD-10-CM | POA: Diagnosis not present

## 2022-05-21 DIAGNOSIS — F32A Depression, unspecified: Secondary | ICD-10-CM | POA: Diagnosis not present

## 2022-05-21 DIAGNOSIS — J45909 Unspecified asthma, uncomplicated: Secondary | ICD-10-CM | POA: Insufficient documentation

## 2022-05-21 DIAGNOSIS — Z96653 Presence of artificial knee joint, bilateral: Secondary | ICD-10-CM | POA: Insufficient documentation

## 2022-05-21 DIAGNOSIS — N179 Acute kidney failure, unspecified: Secondary | ICD-10-CM | POA: Diagnosis not present

## 2022-05-21 DIAGNOSIS — R001 Bradycardia, unspecified: Secondary | ICD-10-CM | POA: Diagnosis present

## 2022-05-21 DIAGNOSIS — K449 Diaphragmatic hernia without obstruction or gangrene: Secondary | ICD-10-CM

## 2022-05-21 LAB — CBC
HCT: 40.7 % (ref 36.0–46.0)
Hemoglobin: 13.5 g/dL (ref 12.0–15.0)
MCH: 31.4 pg (ref 26.0–34.0)
MCHC: 33.2 g/dL (ref 30.0–36.0)
MCV: 94.7 fL (ref 80.0–100.0)
Platelets: 161 10*3/uL (ref 150–400)
RBC: 4.3 MIL/uL (ref 3.87–5.11)
RDW: 13.2 % (ref 11.5–15.5)
WBC: 6.3 10*3/uL (ref 4.0–10.5)
nRBC: 0 % (ref 0.0–0.2)

## 2022-05-21 LAB — TROPONIN I (HIGH SENSITIVITY)
Troponin I (High Sensitivity): 8 ng/L (ref <18)
Troponin I (High Sensitivity): 8 ng/L (ref <18)

## 2022-05-21 LAB — BASIC METABOLIC PANEL
Anion gap: 9 (ref 5–15)
BUN: 28 mg/dL — ABNORMAL HIGH (ref 8–23)
CO2: 28 mmol/L (ref 22–32)
Calcium: 10.2 mg/dL (ref 8.9–10.3)
Chloride: 102 mmol/L (ref 98–111)
Creatinine, Ser: 1.35 mg/dL — ABNORMAL HIGH (ref 0.44–1.00)
GFR, Estimated: 40 mL/min — ABNORMAL LOW (ref >60)
Glucose, Bld: 107 mg/dL — ABNORMAL HIGH (ref 70–99)
Potassium: 4.3 mmol/L (ref 3.5–5.1)
Sodium: 139 mmol/L (ref 135–145)

## 2022-05-21 LAB — D-DIMER, QUANTITATIVE: D-Dimer, Quant: 2 ug/mL-FEU — ABNORMAL HIGH (ref 0.00–0.50)

## 2022-05-21 LAB — LIPASE, BLOOD: Lipase: 18 U/L (ref 11–51)

## 2022-05-21 MED ORDER — LACTATED RINGERS IV SOLN: INTRAVENOUS | Status: AC

## 2022-05-21 MED ORDER — ORAL CARE MOUTH RINSE: 15.0000 mL | OROMUCOSAL | Status: DC | PRN

## 2022-05-21 MED ORDER — HEPARIN SODIUM (PORCINE) 5000 UNIT/ML IJ SOLN
5000.0000 [IU] | Freq: Three times a day (TID) | INTRAMUSCULAR | Status: DC
  Administered 2022-05-22 (×2): 5000 [IU] via SUBCUTANEOUS
  Filled 2022-05-21 (×2): qty 1

## 2022-05-21 MED ORDER — ASPIRIN 81 MG PO CHEW
324.0000 mg | CHEWABLE_TABLET | Freq: Once | ORAL | Status: DC
  Filled 2022-05-21: qty 4

## 2022-05-21 MED ORDER — ENSURE ENLIVE PO LIQD
237.0000 mL | Freq: Two times a day (BID) | ORAL | Status: DC
  Administered 2022-05-22 (×2): 237 mL via ORAL

## 2022-05-21 MED ORDER — IOHEXOL 350 MG/ML SOLN
100.0000 mL | Freq: Once | INTRAVENOUS | Status: AC | PRN
  Administered 2022-05-21: 65 mL via INTRAVENOUS

## 2022-05-21 MED ORDER — ALUM & MAG HYDROXIDE-SIMETH 200-200-20 MG/5ML PO SUSP
30.0000 mL | Freq: Once | ORAL | Status: AC
  Administered 2022-05-21: 30 mL via ORAL
  Filled 2022-05-21: qty 30

## 2022-05-21 MED ORDER — ONDANSETRON HCL 4 MG/2ML IJ SOLN: 4.0000 mg | Freq: Four times a day (QID) | INTRAMUSCULAR | Status: DC | PRN

## 2022-05-21 MED ORDER — ACETAMINOPHEN 650 MG RE SUPP: 650.0000 mg | Freq: Four times a day (QID) | RECTAL | Status: DC | PRN

## 2022-05-21 MED ORDER — ACETAMINOPHEN 325 MG PO TABS: 650.0000 mg | ORAL_TABLET | Freq: Four times a day (QID) | ORAL | Status: DC | PRN

## 2022-05-21 MED ORDER — ONDANSETRON HCL 4 MG PO TABS: 4.0000 mg | ORAL_TABLET | Freq: Four times a day (QID) | ORAL | Status: DC | PRN

## 2022-05-21 MED ORDER — HYDRALAZINE HCL 25 MG PO TABS
25.0000 mg | ORAL_TABLET | Freq: Three times a day (TID) | ORAL | Status: DC
  Administered 2022-05-21 – 2022-05-22 (×3): 25 mg via ORAL
  Filled 2022-05-21 (×3): qty 1

## 2022-05-21 NOTE — Assessment & Plan Note (Signed)
Patient has acute kidney injury likely due to concomitant HCTZ therapy in combination with losartan.  We will need to hold both.  Gentle IV fluids.  Repeat BMP in the morning.

## 2022-05-21 NOTE — Assessment & Plan Note (Signed)
Chronic.  Patient states that she has had a Nissen fundoplication in the past.  She states that Dr. Kipp Brood tried to operate on her several years ago for a repair but this was complicated due to tremendous amount of scar tissue between her stomach and her liver.  She states the procedure was aborted and never completed due to internal bleeding during surgery.

## 2022-05-21 NOTE — ED Provider Notes (Signed)
[facility] Provider Note   CSN: [id number] Arrival date & time: 05/21/22  1553     History {Add pertinent medical, surgical, social history, OB history to HPI:1} Chief Complaint  Patient presents with   Chest Pain    [place] is a 81 y.o. female.  hx of hyperlipidemia, depression, fibromyalgia, esophageal dysmotility, bradycardia, sleep apnea on CPAP presenting with 3 days of constant chest "pressure" in the center of her chest that does not radiate.  She describes it is worse with exertion and better with rest but associate with fatigue.  With exertion she has more chest pressure and feels short of breath becomes nauseated and sweaty.  Denies any cardiac history and states she is never had a heart attack.  Does feel short of breath.  No cough or fever.  No abdominal pain, nausea or vomiting.  Denies any stents in her heart.  No history of CHF or COPD.  No abdominal pain, nausea or vomiting.  No leg pain or leg swelling.  Chart review shows negative CT coronary study in February 2023  The history is provided by the patient.  Chest Pain Associated symptoms: shortness of breath        Home Medications Prior to Admission medications   Medication Sig Start Date End Date Taking? Authorizing Provider  brimonidine (ALPHAGAN) 0.15 % ophthalmic solution Place 1 drop into both eyes in the morning and at bedtime.    [provider]  cetirizine (ZYRTEC) 10 MG tablet Take 10 mg by mouth at bedtime.    [provider]  Cholecalciferol (VITAMIN D3) 250 MCG (10000 UT) capsule Take 10,000 Units by mouth daily.    [provider]  dexlansoprazole (DEXILANT) 60 MG capsule Take 1 capsule (60 mg total) by mouth daily. 11/13/21   Esterwood, Amy S, PA-C  FLUoxetine (PROZAC) 40 MG capsule Take 40 mg by mouth daily. 12/10/19   [provider]  losartan (COZAAR) 50 MG tablet Take 50 mg by mouth daily.     [provider]   montelukast (SINGULAIR) 10 MG tablet Take 10 mg by mouth at bedtime.    [provider]  nystatin (MYCOSTATIN/NYSTOP) powder Apply 1 g topically daily as needed (for rash).  11/20/16   [provider]  nystatin ointment (MYCOSTATIN) Apply 1 application topically 2 (two) times daily as needed (for rash).  11/20/16   [provider]  ondansetron (ZOFRAN) 4 MG tablet Take 1 tablet (4 mg total) by mouth every 4 (four) hours as needed for nausea or vomiting. 11/13/21   Esterwood, Amy S, PA-C  Polyethyl Glycol-Propyl Glycol (SYSTANE OP) Place 1 drop into both eyes daily as needed (dry/irritated eyes).    [provider]  rosuvastatin (CRESTOR) 40 MG tablet Take 40 mg by mouth at bedtime.    [provider]  sucralfate (CARAFATE) 1 g tablet Take 1 tablet (1 g total) by mouth 4 (four) times daily -  with meals and at bedtime. 11/13/21   Esterwood, Amy S, PA-C      Allergies    Adalat [nifedipine], Hydrocodone, Meperidine hcl, and Naproxen    Review of Systems   Review of Systems  Respiratory:  Positive for chest tightness and shortness of breath.   Cardiovascular:  Positive for chest pain.   all other systems are negative except as noted in the HPI and PMH.    Physical Exam Updated Vital Signs BP 126/60 (BP Location: Left Arm)   Pulse 74  Temp 98.8 F (37.1 C) (Oral)   Resp 18   Ht '5\' 10"'$  (1.778 m)   Wt 94.8 kg   SpO2 98%   BMI 29.99 kg/m  Physical Exam Vitals and nursing note reviewed.  Constitutional:      General: She is not in acute distress.    Appearance: She is well-developed.  HENT:     Head: Normocephalic and atraumatic.     Mouth/Throat:     Pharynx: No oropharyngeal exudate.  Eyes:     Conjunctiva/sclera: Conjunctivae normal.     Pupils: Pupils are equal, round, and reactive to light.  Neck:     Comments: No meningismus. Cardiovascular:     Rate and Rhythm: Normal rate and regular rhythm.     Heart sounds: Normal heart  sounds. No murmur heard. Pulmonary:     Effort: Pulmonary effort is normal. No respiratory distress.     Breath sounds: Normal breath sounds.  Abdominal:     Palpations: Abdomen is soft.     Tenderness: There is no abdominal tenderness. There is no guarding or rebound.  Musculoskeletal:        General: No tenderness. Normal range of motion.     Cervical back: Normal range of motion and neck supple.  Skin:    General: Skin is warm.  Neurological:     Mental Status: She is alert and oriented to person, place, and time.     Cranial Nerves: No cranial nerve deficit.     Motor: No abnormal muscle tone.     Coordination: Coordination normal.     Comments:  5/5 strength throughout. CN 2-12 intact.Equal grip strength.   Psychiatric:        Behavior: Behavior normal.     ED Results / Procedures / Treatments   Labs (all labs ordered are listed, but only abnormal results are displayed) Labs Reviewed  BASIC METABOLIC PANEL - Abnormal; Notable for the following components:      Result Value   Glucose, Bld 107 (*)    BUN 28 (*)    Creatinine, Ser 1.35 (*)    GFR, Estimated 40 (*)    All other components within normal limits  D-DIMER, QUANTITATIVE (NOT AT [facility]) - Abnormal; Notable for the following components:   D-Dimer, Quant 2.00 (*)    All other components within normal limits  CBC  LIPASE, BLOOD  TROPONIN I (HIGH SENSITIVITY)  TROPONIN I (HIGH SENSITIVITY)    EKG EKG Interpretation  Date/Time:  Monday May 21 2022 16:11:00 EDT Ventricular Rate:  68 PR Interval:  154 QRS Duration: 90 QT Interval:  390 QTC Calculation: 414 R Axis:   -9 Text Interpretation: Normal sinus rhythm Left ventricular hypertrophy with repolarization abnormality ( R in aVL , Cornell product ) Abnormal ECG When compared with ECG of 26-Oct-2021 17:05, PREVIOUS ECG IS PRESENT Interpretation limited secondary to artifact Confirmed by Ezequiel Essex [id number]) on 05/21/2022 4:27:20 PM  Radiology DG Chest 2  View  Result Date: 05/21/2022 CLINICAL DATA:  Chest pressure and fatigue over the last week. EXAM: CHEST - 2 VIEW COMPARISON:  03/08/2021 FINDINGS: Heart size is normal. Aortic atherosclerosis as seen previously. The pulmonary vascularity is normal. The lungs are clear. No effusions. No acute bone finding. IMPRESSION: No active cardiopulmonary disease. Electronically Signed   By: Nelson Chimes M.D.   On: 05/21/2022 16:48    Procedures Procedures  {Document cardiac monitor, telemetry assessment procedure when appropriate:1}  Medications Ordered in ED Medications  aspirin chewable  tablet 324 mg (has no administration in time range)    ED Course/ Medical Decision Making/ A&P                           Medical Decision Making Amount and/or Complexity of Data Reviewed Labs: ordered. Decision-making details documented in ED Course. Radiology: ordered and independent interpretation performed. Decision-making details documented in ED Course. ECG/medicine tests: ordered and independent interpretation performed. Decision-making details documented in ED Course.  Risk OTC drugs. Prescription drug management.   Exertional chest pain for the past 2 days worse with exertion and better with rest.  EKG shows no acute ST changes.  Patient did have negative CT coronary study in 2023.  Troponin is negative.  D-dimer is positive at 2.0.  EKG is unchanged sinus rhythm.  Rate is concerning for exertional chest pain associate with shortness of breath, nausea vomiting and diaphoresis.  However she did have a CT coronary study in February that showed no acute findings with a calcium score of 0.  Potential admission discussed with Dr. Alcario Drought.  He states he is not certain what the neck step would be.  Recommends discussion with cardiology.  Discussed with Dr. Orene Desanctis.  He reviewed patient's chart.  He states patient would not be catheterization headache does not feel this pain is likely cardiac in  origin   {Document critical care time when appropriate:1} {Document review of labs and clinical decision tools ie heart score, Chads2Vasc2 etc:1}  {Document your independent review of radiology images, and any outside records:1} {Document your discussion with family members, caretakers, and with consultants:1} {Document social determinants of health affecting pt's care:1} {Document your decision making why or why not admission, treatments were needed:1} Final Clinical Impression(s) / ED Diagnoses Final diagnoses:  None    Rx / DC Orders ED Discharge Orders     None

## 2022-05-21 NOTE — Assessment & Plan Note (Addendum)
Observation telemetry bed.  Given her calcium score of 0 and her negative troponins x2 sets, dyspnea on exertion is not related to coronary perfusion.  After further discussion with the patient, could be that her dyspnea exertion is related to chronotropic incompetence versus to low blood pressure for her.  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  She states that she has had heart rates in the 40s for most of her life.  Given that she has sinus bradycardia all the time, she is more reliant on her diastolic blood pressure for brain perfusion.  It may be that she requires higher diastolic blood pressures in order to perfuse her brain.  She does state that she feels quite dizzy when she was started on hydrochlorothiazide and her systolic blood pressure was in the 90s.  Discussed with her that really the only way to help with sinus bradycardia would be to undergo evaluation for chronotropic incompetence.  She states that her heart rate has been as high as the 80 bpm this past week so we know that her heart rate can get that high.  She will need to see cardiology as an outpatient for chronotropic incompetence work-up.  She may need another Zio patch.  Outpatient pulmonary function testing to rule out primary lung disease as a cause of her dyspnea exertion.  However given the fact that she has never had COPD, asthma, pneumonia has never been a smoker, this will likely be normal.

## 2022-05-21 NOTE — ED Triage Notes (Signed)
Patient arrives with complaints of increased chest pressure and fatigue x1 week. Patient states that the chest pressure is intermittent, but she is becoming more fatigued with exertion.   Hx of Bradycardia (HR in the 40-50's at baseline, per patient).

## 2022-05-21 NOTE — Telephone Encounter (Signed)
Pt states she has been sweating bad and her hr has been high, like in the 80's, however it is usually in the 40's. She states today it was 81 and yesterday 82. She also states she is having some pressure in her chest. Pt stated she wanted to be seen so she made an appt on 05/24/22 with Diona Browner, NP.  Her bp is 146/93

## 2022-05-21 NOTE — H&P (Signed)
History and Physical    [name]:[id number] DOB: 1941-01-10 DOA: 05/21/2022  DOS: the patient was seen and examined on 05/21/2022  PCP: Fanny Bien, MD   Patient coming from: Home  I have personally briefly reviewed patient's old medical records in [place]  Chief complaint is dyspnea exertion  History present illness: 81 year old African-American female history of sinus bradycardia for many years, hypertension, rheumatoid arthritis who has had dyspnea exertion for several months.  Patient states that recently has gotten worse.  She states that she was started on HCTZ about a month ago for worsening blood pressure.  She has been on losartan for many years now.  She has noticed increasing dyspnea on exertion with even minimal activity including just walking from the bedroom to the bathroom.  She gets short of breath.  She feels sweaty and has palpitations.  She states that her baseline heart rate is about 40 beats a minute.  She feels palpitations when her heart rate gets about 80 beats a minute.  She presented to the ER today due to fatigue and chest pressure.  Work-up in the ER was negative including CTPA which was negative for a PE.  She ruled out for ischemic disease with 2 sets of cardiac markers.  Cardiology was consulted and they did not recommend any further work-up.  Patient had a coronary CT within the last 6 months that was negative for any coronary disease.  She had a calcium score of 0.  [facility] contacted for admission.   ED Course: Mild AKI noted on chemistry.  EKG shows sinus bradycardia.  2 sets of cardiac markers negative.  CTPA negative for PE.  Review of Systems:  Review of Systems  Constitutional: Negative.   HENT: Negative.    Eyes: Negative.   Cardiovascular:  Positive for palpitations. Negative for leg swelling and PND.  Gastrointestinal: Negative.   Genitourinary: Negative.   Musculoskeletal: Negative.   Skin: Negative.    Neurological:  Positive for dizziness.  Endo/Heme/Allergies: Negative.   Psychiatric/Behavioral: Negative.    All other systems reviewed and are negative.   Past Medical History:  Diagnosis Date   Allergy    Anal or rectal pain    sometimes   Anemia    hx of during pregnancy   Anxiety    Arthritis    Asthma    hx of   Bradycardia    " I KNOW I HAVE BRADYCARDIA ESPECIALLY WHEN I SLEEP"    Cataract 2021   bilateral eyes   Chronic back pain    Degenerative joint disease    osteo   Depression    Diabetes mellitus without complication ([place])    DM type II   Diverticulosis 2003   Dysrhythmia    hx of  due to eye drop and also low heart rate 40's per pt. Dr. Gwenlyn Found follows   Elevated total protein    Esophageal dysmotility    Fibromyalgia    GERD (gastroesophageal reflux disease)    subsequent Nissen Fundoplication   Glaucoma    Hearing loss    Heart murmur    "was told she had a heart murmur"   Hemorrhoids    Hiatal hernia 11/08/09   Hx of adenomatous colonic polyps 07/02/02   Hypercalcemia    Hyperlipidemia    Hyperlipidemia    Hypertension    Nausea    Osteoporosis    PONV (postoperative nausea and vomiting)    Rectal bleeding  from hemorrhoids.     Sleep apnea    DOES USE CPAP    Thrombocytopenia (HCC)    Varicose veins of left lower extremity     Past Surgical History:  Procedure Laterality Date   CARDIAC CATHETERIZATION  03/14/1992   Normal cardiac cath. Normal LV function.   CARDIOVASCULAR STRESS TEST  01/22/2011   No scintigraphic evidence of inducible ischemia.   CAROTID DOPPLER  03/31/2007   Bilateral ICAs - no evidence of significant diameter reduction, dissectin, tortuosity, FMD, or any other vascular abnormality.   CATARACT EXTRACTION, BILATERAL     ESOPHAGEAL MANOMETRY N/A 11/14/2015   Procedure: ESOPHAGEAL MANOMETRY (EM);  Surgeon: Mauri Pole, MD;  Location: [facility];  Service: Endoscopy;  Laterality: N/A;   ESOPHAGEAL MANOMETRY N/A  01/18/2021   Procedure: ESOPHAGEAL MANOMETRY (EM);  Surgeon: Mauri Pole, MD;  Location: [facility];  Service: Endoscopy;  Laterality: N/A;   ESOPHAGOGASTRODUODENOSCOPY N/A 03/08/2021   Procedure: ESOPHAGOGASTRODUODENOSCOPY (EGD);  Surgeon: Lajuana Matte, MD;  Location: [facility];  Service: Thoracic;  Laterality: N/A;   EYE SURGERY     bilateral cataracts; bilateral stents   GASTRIC RESECTION  2009   GLAUCOMA SURGERY     JOINT REPLACEMENT     right knee Dr. Wynelle Link 06-23-18   KNEE SURGERY Bilateral    NISSEN FUNDOPLICATION  7858   with subsequent takedown in 2009   [place] Left 06/10/2017   Procedure: LEFT  TOTAL KNEE ARTHROPLASTY;  Surgeon: Gaynelle Arabian, MD;  Location: [facility];  Service: Orthopedics;  Laterality: Left;  Adductor Block   TOTAL KNEE ARTHROPLASTY Right 06/23/2018   Procedure: RIGHT TOTAL KNEE ARTHROPLASTY;  Surgeon: Gaynelle Arabian, MD;  Location: [facility];  Service: Orthopedics;  Laterality: Right;   TRANSTHORACIC ECHOCARDIOGRAM  12/21/2010   EF 60%, moderate LVH,    TUBAL LIGATION     XI ROBOTIC ASSISTED HIATAL HERNIA REPAIR N/A 03/08/2021   Procedure: XI ROBOTIC ASSISTED LAPAROSCOPY WITH LYSIS OF ADHESIONS;  Surgeon: Lajuana Matte, MD;  Location: [facility];  Service: Thoracic;  Laterality: N/A;     reports that she has never smoked. She has never used smokeless tobacco. She reports that she does not drink alcohol and does not use drugs.  Allergies  Allergen Reactions   Adalat [Nifedipine] Other (See Comments)    Unknown   Hydrocodone Itching and Nausea And Vomiting   Meperidine Hcl Other (See Comments)    Demerol causes hallucinations   Naproxen Nausea And Vomiting    Family History  Problem Relation Age of Onset   Heart disease Mother    Hypertension Mother    Diabetes Mother    Diabetes Father    Kidney disease Father    Stroke Brother    Cancer Maternal Grandmother    Kidney disease Maternal Grandfather    Breast cancer Daughter     Stomach cancer Maternal Aunt    Uterine cancer Maternal Aunt    Leukemia Maternal Uncle    Prostate cancer Maternal Uncle    Colon cancer Neg Hx    Rectal cancer Neg Hx    Esophageal cancer Neg Hx     Prior to Admission medications   Medication Sig Start Date End Date Taking? Authorizing Provider  brimonidine (ALPHAGAN) 0.15 % ophthalmic solution Place 1 drop into both eyes in the morning and at bedtime.    [provider]  cetirizine (ZYRTEC) 10 MG tablet Take 10 mg by mouth at bedtime.    [provider]  Cholecalciferol (VITAMIN D3) 250 MCG (10000 UT) capsule Take 10,000 Units by mouth daily.    [provider]  dexlansoprazole (DEXILANT) 60 MG capsule Take 1 capsule (60 mg total) by mouth daily. 11/13/21   Esterwood, Amy S, PA-C  FLUoxetine (PROZAC) 40 MG capsule Take 40 mg by mouth daily. 12/10/19   [provider]  losartan (COZAAR) 50 MG tablet Take 50 mg by mouth daily.     [provider]  montelukast (SINGULAIR) 10 MG tablet Take 10 mg by mouth at bedtime.    [provider]  nystatin (MYCOSTATIN/NYSTOP) powder Apply 1 g topically daily as needed (for rash).  11/20/16   [provider]  nystatin ointment (MYCOSTATIN) Apply 1 application topically 2 (two) times daily as needed (for rash).  11/20/16   [provider]  ondansetron (ZOFRAN) 4 MG tablet Take 1 tablet (4 mg total) by mouth every 4 (four) hours as needed for nausea or vomiting. 11/13/21   Esterwood, Amy S, PA-C  Polyethyl Glycol-Propyl Glycol (SYSTANE OP) Place 1 drop into both eyes daily as needed (dry/irritated eyes).    [provider]  rosuvastatin (CRESTOR) 40 MG tablet Take 40 mg by mouth at bedtime.    [provider]  sucralfate (CARAFATE) 1 g tablet Take 1 tablet (1 g total) by mouth 4 (four) times daily -  with meals and at bedtime. 11/13/21   Alfredia Ferguson, PA-C    Physical Exam: Vitals:   05/21/22 1930 05/21/22 1955  05/21/22 2000 05/21/22 2134  BP: (!) 161/74 (!) 154/80 (!) 162/110 (!) 187/56  Pulse: (!) 49 (!) 49 (!) 58 (!) 51  Resp: 14   (!) 22  Temp:  98.6 F (37 C)  98.2 F (36.8 C)  TempSrc:    Oral  SpO2: 98% 94% 96% 100%  Weight:      Height:        Physical Exam Vitals and nursing note reviewed.  Constitutional:      General: She is not in acute distress.    Appearance: Normal appearance. She is normal weight. She is not ill-appearing, toxic-appearing or diaphoretic.  HENT:     Head: Normocephalic and atraumatic.     Nose: Nose normal. No rhinorrhea.  Eyes:     General: No scleral icterus. Cardiovascular:     Rate and Rhythm: Regular rhythm. Bradycardia present.     Pulses: Normal pulses.  Pulmonary:     Effort: Pulmonary effort is normal. No respiratory distress.     Breath sounds: Normal breath sounds. No wheezing or rales.  Abdominal:     General: Abdomen is flat. Bowel sounds are normal. There is no distension.     Palpations: Abdomen is soft.     Tenderness: There is no abdominal tenderness. There is no guarding.  Musculoskeletal:     Right lower leg: No edema.     Left lower leg: No edema.  Skin:    General: Skin is warm and dry.     Capillary Refill: Capillary refill takes less than 2 seconds.  Neurological:     General: No focal deficit present.     Mental Status: She is alert and oriented to person, place, and time.      Labs on Admission: I have personally reviewed following labs and imaging studies  CBC: Recent Labs  Lab 05/21/22 1626  WBC 6.3  HGB 13.5  HCT 40.7  MCV 94.7  PLT 193   Basic Metabolic  Panel: Recent Labs  Lab 05/21/22 1626  NA 139  K 4.3  CL 102  CO2 28  GLUCOSE 107*  BUN 28*  CREATININE 1.35*  CALCIUM 10.2   GFR: Estimated Creatinine Clearance: 42.1 mL/min (A) (by C-G formula based on SCr of 1.35 mg/dL (H)). Liver Function Tests: No results for input(s): "AST", "ALT", "ALKPHOS", "BILITOT", "PROT", "ALBUMIN" in the last 168  hours. Recent Labs  Lab 05/21/22 1626  LIPASE 18   No results for input(s): "AMMONIA" in the last 168 hours. Coagulation Profile: No results for input(s): "INR", "PROTIME" in the last 168 hours. Cardiac Enzymes: Recent Labs  Lab 05/21/22 1626 05/21/22 1826  TROPONINIHS 8 8   BNP (last 3 results) No results for input(s): "PROBNP" in the last 8760 hours. HbA1C: No results for input(s): "HGBA1C" in the last 72 hours. CBG: No results for input(s): "GLUCAP" in the last 168 hours. Lipid Profile: No results for input(s): "CHOL", "HDL", "LDLCALC", "TRIG", "CHOLHDL", "LDLDIRECT" in the last 72 hours. Thyroid Function Tests: No results for input(s): "TSH", "T4TOTAL", "FREET4", "T3FREE", "THYROIDAB" in the last 72 hours. Anemia Panel: No results for input(s): "VITAMINB12", "FOLATE", "FERRITIN", "TIBC", "IRON", "RETICCTPCT" in the last 72 hours. Urine analysis:    Component Value Date/Time   COLORURINE YELLOW 10/26/2021 2100   APPEARANCEUR CLEAR 10/26/2021 2100   LABSPEC 1.044 (H) 10/26/2021 2100   PHURINE 6.5 10/26/2021 2100   GLUCOSEU NEGATIVE 10/26/2021 2100   HGBUR NEGATIVE 10/26/2021 2100   BILIRUBINUR NEGATIVE 10/26/2021 2100   [place] NEGATIVE 10/26/2021 2100   PROTEINUR NEGATIVE 10/26/2021 2100   UROBILINOGEN 0.2 06/09/2015 1931   NITRITE NEGATIVE 10/26/2021 2100   LEUKOCYTESUR TRACE (A) 10/26/2021 2100    Radiological Exams on Admission: I have personally reviewed images CT Angio Chest PE W and/or Wo Contrast  Result Date: 05/21/2022 CLINICAL DATA:  Chest pressure and fatigue x1 week. EXAM: CT ANGIOGRAPHY CHEST WITH CONTRAST TECHNIQUE: Multidetector CT imaging of the chest was performed using the standard protocol during bolus administration of intravenous contrast. Multiplanar CT image reconstructions and MIPs were obtained to evaluate the vascular anatomy. RADIATION DOSE REDUCTION: This exam was performed according to the departmental dose-optimization program which  includes automated exposure control, adjustment of the mA and/or kV according to patient size and/or use of iterative reconstruction technique. CONTRAST:  60m OMNIPAQUE IOHEXOL 350 MG/ML SOLN COMPARISON:  December 04, 2021 FINDINGS: Cardiovascular: There is mild calcification of the aortic arch, without evidence of aortic aneurysm. Satisfactory opacification of the pulmonary arteries to the segmental level. No evidence of pulmonary embolism. Normal heart size. No pericardial effusion. Mediastinum/Nodes: No enlarged mediastinal, hilar, or axillary lymph nodes. Thyroid gland, trachea, and esophagus demonstrate no significant findings. Lungs/Pleura: Mild dependent atelectatic changes are seen along the periphery of the bilateral lower lobes. There is no evidence of acute infiltrate, pleural effusion or pneumothorax. Upper Abdomen: There is a small to moderate sized hiatal hernia. Surgical sutures are seen within the gastric region. Noninflamed diverticula are seen throughout the visualized portion of the large bowel. Musculoskeletal: No chest wall abnormality. No acute or significant osseous findings. Review of the MIP images confirms the above findings. IMPRESSION: 1. No evidence of pulmonary embolism or other acute intrathoracic process. 2. Small to moderate sized hiatal hernia. 3. Colonic diverticulosis. Aortic Atherosclerosis (ICD10-I70.0). Electronically Signed   By: TVirgina NorfolkM.D.   On: 05/21/2022 18:22   DG Chest 2 View  Result Date: 05/21/2022 CLINICAL DATA:  Chest pressure and fatigue over the last week. EXAM: CHEST -  2 VIEW COMPARISON:  03/08/2021 FINDINGS: Heart size is normal. Aortic atherosclerosis as seen previously. The pulmonary vascularity is normal. The lungs are clear. No effusions. No acute bone finding. IMPRESSION: No active cardiopulmonary disease. Electronically Signed   By: Nelson Chimes M.D.   On: 05/21/2022 16:48    EKG: My personal interpretation of EKG shows: NSR, LVH by  voltage    Assessment/Plan Principal Problem:   Dyspnea on exertion Active Problems:   Essential hypertension   Sinus bradycardia   AKI (acute kidney injury) (HCC)   HLD (hyperlipidemia)   Depression   Hiatal hernia    Assessment and Plan: * Dyspnea on exertion Observation telemetry bed.  Given her calcium score of 0 and her negative troponins x2 sets, dyspnea on exertion is not related to coronary perfusion.  After further discussion with the patient, could be that her dyspnea exertion is related to chronotropic incompetence versus to low blood pressure for her.  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  She states that she has had heart rates in the 40s for most of her life.  Given that she has sinus bradycardia all the time, she is more reliant on her diastolic blood pressure for brain perfusion.  It may be that she requires higher diastolic blood pressures in order to perfuse her brain.  She does state that she feels quite dizzy when she was started on hydrochlorothiazide and her systolic blood pressure was in the 90s.  Discussed with her that really the only way to help with sinus bradycardia would be to undergo evaluation for chronotropic incompetence.  She states that her heart rate has been as high as the 80 bpm this past week so we know that her heart rate can get that high.  She will need to see cardiology as an outpatient for chronotropic incompetence work-up.  She may need another Zio patch.  Outpatient pulmonary function testing to rule out primary lung disease as a cause of her dyspnea exertion.  However given the fact that she has never had COPD, asthma, pneumonia has never been a smoker, this will likely be normal.  AKI (acute kidney injury) ([place]) Patient has acute kidney injury likely due to concomitant HCTZ therapy in combination with losartan.  We will need to hold both.  Gentle IV fluids.  Repeat BMP in the morning.  Sinus bradycardia Chronic.  Her sinus bradycardia  is not due to ischemic disease.  She has a calcium score of 0 with no coronary disease on her CT.  She cannot be on any AV nodal blockers for her blood pressure given her sinus bradycardia.  Another factor that may be causing her sinus bradycardia is her rheumatoid arthritis.  She may benefit from seeing electrophysiology.  Consider cardiac MRI to rule out infiltrative disease.  Essential hypertension Patient has been on losartan for a while.  HCTZ was recently started.  Patient does have difficulty with gait and diuretics may not be the best choice for a patient with mobility issues.  Also given the fact that she is on ARB, with addition of diuretics, need to be careful that she does get acute kidney injury like she does now.  We will need to stop her HCTZ for now.  Norvasc or hydralazine may be a better antihypertensive for her.  Again given the fact that she is more dependent upon her diastolic pressures for brain perfusion, will need to target higher diastolic pressures probably 90s to 100s.  And tolerate systolic blood pressures  in the 160s to 170 ranges.  I have asked the patient and her husband to start keeping a journal with her heart rate, blood pressure, activity level and document how she is feeling.  They will need several weeks of data in order to sort out what the ideal blood pressure and heart rate is for her.  Given the fact that she already has sinus bradycardia, her blood pressure will be the variable that needs to be changed in order to see if this makes an impact on how she feels.  Hiatal hernia Chronic.  Patient states that she has had a Nissen fundoplication in the past.  She states that Dr. Kipp Brood tried to operate on her several years ago for a repair but this was complicated due to tremendous amount of scar tissue between her stomach and her liver.  She states the procedure was aborted and never completed due to internal bleeding during surgery.  Depression Stable.  Continue  Zoloft.  HLD (hyperlipidemia) Stable.  Continue Crestor.   DVT prophylaxis: SQ Heparin Code Status: Full Code Family Communication: discussed with pt, husband and dtr at bedside  Disposition Plan: return home  Consults called: none  Admission status: Observation, Telemetry bed   Kristopher Oppenheim, DO [facility] 05/21/2022, 11:03 PM

## 2022-05-21 NOTE — Assessment & Plan Note (Addendum)
Chronic.  Her sinus bradycardia is not due to ischemic disease.  She has a calcium score of 0 with no coronary disease on her CT.  She cannot be on any AV nodal blockers for her blood pressure given her sinus bradycardia.  Another factor that may be causing her sinus bradycardia is her rheumatoid arthritis.  She may benefit from seeing electrophysiology.  Consider cardiac MRI to rule out infiltrative disease.

## 2022-05-21 NOTE — Telephone Encounter (Signed)
Called patient left message on personal voice mail to call back. 

## 2022-05-21 NOTE — Assessment & Plan Note (Signed)
-  Stable -Continue Zoloft 

## 2022-05-21 NOTE — Telephone Encounter (Signed)
Spoke to patient she stated she is having chest pressure.Stated she perspires heavy with the least exertion.Stated chest pressure # 5 at present.Advised to go to ED to be evaluated.

## 2022-05-21 NOTE — Assessment & Plan Note (Signed)
Patient has been on losartan for a while.  HCTZ was recently started.  Patient does have difficulty with gait and diuretics may not be the best choice for a patient with mobility issues.  Also given the fact that she is on ARB, with addition of diuretics, need to be careful that she does get acute kidney injury like she does now.  We will need to stop her HCTZ for now.  Norvasc or hydralazine may be a better antihypertensive for her.  Again given the fact that she is more dependent upon her diastolic pressures for brain perfusion, will need to target higher diastolic pressures probably 90s to 100s.  And tolerate systolic blood pressures in the 160s to 170 ranges.  I have asked the patient and her husband to start keeping a journal with her heart rate, blood pressure, activity level and document how she is feeling.  They will need several weeks of data in order to sort out what the ideal blood pressure and heart rate is for her.  Given the fact that she already has sinus bradycardia, her blood pressure will be the variable that needs to be changed in order to see if this makes an impact on how she feels.

## 2022-05-21 NOTE — Assessment & Plan Note (Signed)
Stable. Continue Crestor. 

## 2022-05-21 NOTE — Subjective & Objective (Signed)
Chief complaint is dyspnea exertion  History present illness: 81 year old African-American female history of sinus bradycardia for many years, hypertension, rheumatoid arthritis who has had dyspnea exertion for several months.  Patient states that recently has gotten worse.  She states that she was started on HCTZ about a month ago for worsening blood pressure.  She has been on losartan for many years now.  She has noticed increasing dyspnea on exertion with even minimal activity including just walking from the bedroom to the bathroom.  She gets short of breath.  She feels sweaty and has palpitations.  She states that her baseline heart rate is about 40 beats a minute.  She feels palpitations when her heart rate gets about 80 beats a minute.  She presented to the ER today due to fatigue and chest pressure.  Work-up in the ER was negative including CTPA which was negative for a PE.  She ruled out for ischemic disease with 2 sets of cardiac markers.  Cardiology was consulted and they did not recommend any further work-up.  Patient had a coronary CT within the last 6 months that was negative for any coronary disease.  She had a calcium score of 0.  [facility] contacted for admission.

## 2022-05-22 ENCOUNTER — Encounter (HOSPITAL_COMMUNITY): Payer: Self-pay | Admitting: Internal Medicine

## 2022-05-22 DIAGNOSIS — I129 Hypertensive chronic kidney disease with stage 1 through stage 4 chronic kidney disease, or unspecified chronic kidney disease: Secondary | ICD-10-CM | POA: Diagnosis not present

## 2022-05-22 DIAGNOSIS — R42 Dizziness and giddiness: Secondary | ICD-10-CM | POA: Diagnosis not present

## 2022-05-22 DIAGNOSIS — F320 Major depressive disorder, single episode, mild: Secondary | ICD-10-CM | POA: Diagnosis not present

## 2022-05-22 DIAGNOSIS — N179 Acute kidney failure, unspecified: Secondary | ICD-10-CM | POA: Diagnosis not present

## 2022-05-22 DIAGNOSIS — R079 Chest pain, unspecified: Secondary | ICD-10-CM

## 2022-05-22 DIAGNOSIS — Z79899 Other long term (current) drug therapy: Secondary | ICD-10-CM | POA: Diagnosis not present

## 2022-05-22 DIAGNOSIS — R001 Bradycardia, unspecified: Secondary | ICD-10-CM | POA: Diagnosis not present

## 2022-05-22 DIAGNOSIS — N1831 Chronic kidney disease, stage 3a: Secondary | ICD-10-CM | POA: Diagnosis not present

## 2022-05-22 DIAGNOSIS — J45909 Unspecified asthma, uncomplicated: Secondary | ICD-10-CM | POA: Diagnosis not present

## 2022-05-22 DIAGNOSIS — E7800 Pure hypercholesterolemia, unspecified: Secondary | ICD-10-CM | POA: Diagnosis not present

## 2022-05-22 DIAGNOSIS — I10 Essential (primary) hypertension: Secondary | ICD-10-CM | POA: Diagnosis not present

## 2022-05-22 DIAGNOSIS — E1122 Type 2 diabetes mellitus with diabetic chronic kidney disease: Secondary | ICD-10-CM | POA: Diagnosis not present

## 2022-05-22 DIAGNOSIS — G4733 Obstructive sleep apnea (adult) (pediatric): Secondary | ICD-10-CM | POA: Diagnosis not present

## 2022-05-22 DIAGNOSIS — R0609 Other forms of dyspnea: Secondary | ICD-10-CM | POA: Diagnosis not present

## 2022-05-22 DIAGNOSIS — Z96653 Presence of artificial knee joint, bilateral: Secondary | ICD-10-CM | POA: Diagnosis not present

## 2022-05-22 LAB — CBC WITH DIFFERENTIAL/PLATELET
Abs Immature Granulocytes: 0.01 10*3/uL (ref 0.00–0.07)
Basophils Absolute: 0.1 10*3/uL (ref 0.0–0.1)
Basophils Relative: 1 %
Eosinophils Absolute: 0.2 10*3/uL (ref 0.0–0.5)
Eosinophils Relative: 3 %
HCT: 37.8 % (ref 36.0–46.0)
Hemoglobin: 12.2 g/dL (ref 12.0–15.0)
Immature Granulocytes: 0 %
Lymphocytes Relative: 36 %
Lymphs Abs: 2.1 10*3/uL (ref 0.7–4.0)
MCH: 31.3 pg (ref 26.0–34.0)
MCHC: 32.3 g/dL (ref 30.0–36.0)
MCV: 96.9 fL (ref 80.0–100.0)
Monocytes Absolute: 0.5 10*3/uL (ref 0.1–1.0)
Monocytes Relative: 9 %
Neutro Abs: 3 10*3/uL (ref 1.7–7.7)
Neutrophils Relative %: 51 %
Platelets: 142 10*3/uL — ABNORMAL LOW (ref 150–400)
RBC: 3.9 MIL/uL (ref 3.87–5.11)
RDW: 13.2 % (ref 11.5–15.5)
WBC: 5.9 10*3/uL (ref 4.0–10.5)
nRBC: 0 % (ref 0.0–0.2)

## 2022-05-22 LAB — COMPREHENSIVE METABOLIC PANEL
ALT: 12 U/L (ref 0–44)
AST: 16 U/L (ref 15–41)
Albumin: 3.3 g/dL — ABNORMAL LOW (ref 3.5–5.0)
Alkaline Phosphatase: 60 U/L (ref 38–126)
Anion gap: 7 (ref 5–15)
BUN: 26 mg/dL — ABNORMAL HIGH (ref 8–23)
CO2: 26 mmol/L (ref 22–32)
Calcium: 9.3 mg/dL (ref 8.9–10.3)
Chloride: 107 mmol/L (ref 98–111)
Creatinine, Ser: 1.32 mg/dL — ABNORMAL HIGH (ref 0.44–1.00)
GFR, Estimated: 41 mL/min — ABNORMAL LOW (ref >60)
Glucose, Bld: 212 mg/dL — ABNORMAL HIGH (ref 70–99)
Potassium: 3.6 mmol/L (ref 3.5–5.1)
Sodium: 140 mmol/L (ref 135–145)
Total Bilirubin: 0.3 mg/dL (ref 0.3–1.2)
Total Protein: 6.2 g/dL — ABNORMAL LOW (ref 6.5–8.1)

## 2022-05-22 LAB — TSH: TSH: 0.88 u[IU]/mL (ref 0.350–4.500)

## 2022-05-22 LAB — MAGNESIUM: Magnesium: 2.2 mg/dL (ref 1.7–2.4)

## 2022-05-22 NOTE — TOC Initial Note (Signed)
Transition of Care [facility] - Initial/Assessment Note    Patient Details  Name: [name] MRN: [id number] Date of Birth: 1941-06-28  Transition of Care [facility]) CM/SW Contact:    Leeroy Cha, RN Phone Number: 05/22/2022, 8:05 AM  Clinical Narrative:                  Transition of Care [facility]) Screening Note   Patient Details  Name: [name] Date of Birth: 04-24-1941   Transition of Care [facility]) CM/SW Contact:    Leeroy Cha, RN Phone Number: 05/22/2022, 8:06 AM    Transition of Care Department [facility]) has reviewed patient and no TOC needs have been identified at this time. We will continue to monitor patient advancement through interdisciplinary progression rounds. If new patient transition needs arise, please place a TOC consult.    Expected Discharge Plan: Home/Self Care Barriers to Discharge: Continued Medical Work up   Patient Goals and CMS Choice Patient states their goals for this hospitalization and ongoing recovery are:: to go home      Expected Discharge Plan and Services Expected Discharge Plan: Home/Self Care   Discharge Planning Services: CM Consult   Living arrangements for the past 2 months: Single Family Home                                      Prior Living Arrangements/Services Living arrangements for the past 2 months: Single Family Home Lives with:: Spouse Patient language and need for interpreter reviewed:: Yes Do you feel safe going back to the place where you live?: Yes            Criminal Activity/Legal Involvement Pertinent to Current Situation/Hospitalization: No - Comment as needed  Activities of Daily Living Home Assistive Devices/Equipment: Cane (specify quad or straight) ADL Screening (condition at time of admission) Patient's cognitive ability adequate to safely complete daily activities?: Yes Is the patient deaf or have difficulty hearing?: No Does the patient have difficulty seeing, even when wearing  glasses/contacts?: Yes Does the patient have difficulty concentrating, remembering, or making decisions?: No Patient able to express need for assistance with ADLs?: Yes Does the patient have difficulty dressing or bathing?: No Independently performs ADLs?: Yes (appropriate for developmental age) Does the patient have difficulty walking or climbing stairs?: Yes Weakness of Legs: Both Weakness of Arms/Hands: Both  Permission Sought/Granted                  Emotional Assessment Appearance:: Appears stated age Attitude/Demeanor/Rapport: Engaged Affect (typically observed): Calm Orientation: : Oriented to Place, Oriented to Self, Oriented to  Time, Oriented to Situation Alcohol / Substance Use: Never Used Psych Involvement: No (comment)  Admission diagnosis:  Exertional chest pain [R07.9] Chest pain, rule out acute myocardial infarction [R07.9] Patient Active Problem List   Diagnosis Date Noted   AKI (acute kidney injury) ([place]) 05/21/2022   Hiatal hernia 03/08/2021   Heartburn    Ineffective esophageal motility    Dyspnea on exertion 07/24/2020   Change in bowel habits 08/22/2018   Knee stiff 07/23/2018   Degeneration of lumbar intervertebral disc 02/04/2018   Scoliosis of lumbar spine 02/04/2018   Sinus bradycardia 07/27/2016   Cough    Essential hypertension 11/24/2014   Gonalgia 05/13/2013   OA (osteoarthritis) of knee 05/13/2013   Degenerative joint disease involving multiple joints 01/19/2013   Rheumatoid arthritis ([place]) 01/19/2013   Thyroid nodule  06/11/2012   Elevated total protein    Cyst of thyroid 10/29/2011   Hemorrhoids, internal 05/28/2011   Acid reflux 02/14/2011   Avitaminosis D 02/13/2011   Glaucoma 11/28/2010   HLD (hyperlipidemia) 12/14/2009   Internal and external bleeding hemorrhoids 12/14/2009   DIVERTICULOSIS, COLON 12/14/2009   Joint pain 12/14/2009   BACK PAIN, CHRONIC 12/14/2009   FIBROMYALGIA 12/14/2009   OSTEOPOROSIS 12/14/2009   Sleep  apnea 12/14/2009   COLONIC POLYPS, ADENOMATOUS, HX OF 12/14/2009   GASTRIC POLYP, HX OF 12/14/2009   Diaphragmatic hernia 10/04/2009   Depression 09/29/2009   REFLUX ESOPHAGITIS 09/29/2009   GERD 09/29/2009   Constipation 09/29/2009   DYSPHAGIA 09/29/2009   PCP:  Fanny Bien, MD Pharmacy:   [id number] [place] [place]Phone: [phone]: [phone]    Social Determinants of Health (SDOH) Interventions    Readmission Risk Interventions     No data to display

## 2022-05-22 NOTE — Progress Notes (Signed)
Patient has been read and explained discharge instructions. Patient has no further questions at this time. IV has been removed. Site is clean, dry and intact.  Layla Maw, RN

## 2022-05-22 NOTE — Consult Note (Signed)
Cardiology Consultation:   Patient ID: [name] MRN: [id number]; DOB: 25-Mar-1941  Admit date: 05/21/2022 Date of Consult: 05/22/2022  PCP:  Cheryl Bien, MD   [facility] Providers Cardiologist:  Cheryl Burow, MD   {   Patient Profile:   [name] is a 81 y.o. female with a hx of bradycardia, hypertension, hyperlipidemia, hiatal hernia with esophageal dysmotility, fibromyalgia and sleep apnea on CPAP who is being seen 05/22/2022 for the evaluation of shortness of breath at the request of Dr. Sherral Bates.  She was admitted October 2021 with chest pain and shortness of breath.  2D echo was unremarkable and chest CTA showed no evidence of pulmonary embolism.  She went to the ED in January 2023 and was presyncopal; thought likely to have a viral syndrome.  She has chronic intermittent chest pain she thinks related to hiatal hernia.  Dr. Kipp Bates tried to repair this however had abort the procedure last year.  This chest pain radiates to her neck, not exertional and she tries Carafate to make it feel better.    Seen in clinic February/March for persistent lightheadedness and weakness.  Also had dyspnea on exertion.  She was hypotensive and bradycardic.  Cardiac work-up ordered 11/2032: -Zio patch x2 weeks showing normal sinus bradycardia/rhythm (min HR of 40) without significant arrhythmia.   -Coronaries CT showed normal coronaries with calcium score 0. -2D echo with EF 60-65%, mild LVH, grade 1 diastolic dysfunction, normal RV and no significant valve disease.  History of Present Illness:   [name] presented with 1 week history of chest pressure, shortness of breath and fatigue.  She has chronic dyspnea on exertion but this is different.  Patient reported for the past 1 week she has intermittent exertional shortness of breath, chest pressure, diaphoresis and lightheadedness.  Heart rate is in 80s.  Normally her heart rate runs in 40s.  No syncope.  Due to worsening symptoms she  presented to ER.  Bates to have mild AKI with creatinine 1.35.  Home hydrochlorothiazide and losartan were held.  Given fluid.  This morning she is feeling better.  Troponin negative x 2. Scr 1.35>>1.32 D-dimer 2.00  CT angio of chest without PE TSH normal  Past Medical History:  Diagnosis Date   Allergy    Anal or rectal pain    sometimes   Anemia    hx of during pregnancy   Anxiety    Arthritis    Asthma    hx of   Bradycardia    " I KNOW I HAVE BRADYCARDIA ESPECIALLY WHEN I SLEEP"    Cataract 2021   bilateral eyes   Chronic back pain    Degenerative joint disease    osteo   Depression    Diabetes mellitus without complication ([place])    DM type II   Diverticulosis 2003   Dysrhythmia    hx of  due to eye drop and also low heart rate 40's per pt. Dr. Gwenlyn Bates follows   Elevated total protein    Esophageal dysmotility    Fibromyalgia    GERD (gastroesophageal reflux disease)    subsequent Nissen Fundoplication   Glaucoma    Hearing loss    Heart murmur    "was told she had a heart murmur"   Hemorrhoids    Hiatal hernia 11/08/09   Hx of adenomatous colonic polyps 07/02/02   Hypercalcemia    Hyperlipidemia    Hyperlipidemia    Hypertension    Nausea  Osteoporosis    PONV (postoperative nausea and vomiting)    Rectal bleeding    from hemorrhoids.     Sleep apnea    DOES USE CPAP    Thrombocytopenia (HCC)    Varicose veins of left lower extremity     Past Surgical History:  Procedure Laterality Date   CARDIAC CATHETERIZATION  03/14/1992   Normal cardiac cath. Normal LV function.   CARDIOVASCULAR STRESS TEST  01/22/2011   No scintigraphic evidence of inducible ischemia.   CAROTID DOPPLER  03/31/2007   Bilateral ICAs - no evidence of significant diameter reduction, dissectin, tortuosity, FMD, or any other vascular abnormality.   CATARACT EXTRACTION, BILATERAL     ESOPHAGEAL MANOMETRY N/A 11/14/2015   Procedure: ESOPHAGEAL MANOMETRY (EM);  Surgeon: Cheryl Pole, MD;  Location: [facility];  Service: Endoscopy;  Laterality: N/A;   ESOPHAGEAL MANOMETRY N/A 01/18/2021   Procedure: ESOPHAGEAL MANOMETRY (EM);  Surgeon: Cheryl Pole, MD;  Location: [facility];  Service: Endoscopy;  Laterality: N/A;   ESOPHAGOGASTRODUODENOSCOPY N/A 03/08/2021   Procedure: ESOPHAGOGASTRODUODENOSCOPY (EGD);  Surgeon: Cheryl Matte, MD;  Location: [place];  Service: Thoracic;  Laterality: N/A;   EYE SURGERY     bilateral cataracts; bilateral stents   GASTRIC RESECTION  2009   GLAUCOMA SURGERY     JOINT REPLACEMENT     right knee [name] 06-23-18   KNEE SURGERY Bilateral    NISSEN FUNDOPLICATION  0165   with subsequent takedown in 2009   [name] Left 06/10/2017   Procedure: LEFT  TOTAL KNEE ARTHROPLASTY;  Surgeon: Cheryl Arabian, MD;  Location: [facility];  Service: Orthopedics;  Laterality: Left;  Adductor Block   TOTAL KNEE ARTHROPLASTY Right 06/23/2018   Procedure: RIGHT TOTAL KNEE ARTHROPLASTY;  Surgeon: Cheryl Arabian, MD;  Location: [facility];  Service: Orthopedics;  Laterality: Right;   TRANSTHORACIC ECHOCARDIOGRAM  12/21/2010   EF 60%, moderate LVH,    TUBAL LIGATION     XI ROBOTIC ASSISTED HIATAL HERNIA REPAIR N/A 03/08/2021   Procedure: XI ROBOTIC ASSISTED LAPAROSCOPY WITH LYSIS OF ADHESIONS;  Surgeon: Cheryl Matte, MD;  Location: [facility];  Service: Thoracic;  Laterality: N/A;     Inpatient Medications: Scheduled Meds:  feeding supplement  237 mL Oral BID BM   heparin  5,000 Units Subcutaneous Q8H   hydrALAZINE  25 mg Oral Q8H   Continuous Infusions:  lactated ringers 125 mL/hr at 05/22/22 0728   PRN Meds: acetaminophen **OR** acetaminophen, ondansetron **OR** ondansetron (ZOFRAN) IV, mouth rinse  Allergies:    Allergies  Allergen Reactions   Adalat [Nifedipine] Other (See Comments)    Tired, Made pt "feel crazy in the head"   Hydrocodone Itching and Nausea And Vomiting   Meperidine Hcl Other (See Comments)    Demerol  causes hallucinations   Naproxen Nausea And Vomiting    Social History:   Social History   Socioeconomic History   Marital status: Married    Spouse name: Not on file   Number of children: 8   Years of education: Not on file   Highest education level: Not on file  Occupational History   Occupation: retired    Comment: retired Therapist, art.   Tobacco Use   Smoking status: Never   Smokeless tobacco: Never  Vaping Use   Vaping Use: Never used  Substance and Sexual Activity   Alcohol use: No    Alcohol/week: 0.0 standard drinks of alcohol   Drug use: No   Sexual  activity: Yes  Other Topics Concern   Not on file  Social History Narrative   Husband, Cristiana Yochim is Next of Kin. Cell # [phone]   Social Determinants of Health   Financial Resource Strain: Not on file  Food Insecurity: Not on file  Transportation Needs: Not on file  Physical Activity: Not on file  Stress: Not on file  Social Connections: Not on file  Intimate Partner Violence: Not on file    Family History:    Family History  Problem Relation Age of Onset   Heart disease Mother    Hypertension Mother    Diabetes Mother    Diabetes Father    Kidney disease Father    Stroke Brother    Cancer Maternal Grandmother    Kidney disease Maternal Grandfather    Breast cancer Daughter    Stomach cancer Maternal Aunt    Uterine cancer Maternal Aunt    Leukemia Maternal Uncle    Prostate cancer Maternal Uncle    Colon cancer Neg Hx    Rectal cancer Neg Hx    Esophageal cancer Neg Hx      ROS:  Please see the history of present illness.  All other ROS reviewed and negative.     Physical Exam/Data:   Vitals:   05/21/22 2134 05/22/22 0133 05/22/22 0500 05/22/22 0502  BP: (!) 187/56 (!) 149/56  (!) 168/62  Pulse: (!) 51 64  (!) 50  Resp: (!) '22 15  18  '$ Temp: 98.2 F (36.8 C) 97.7 F (36.5 C)  97.7 F (36.5 C)  TempSrc: Oral Oral  Oral  SpO2: 100% 95%  99%  Weight: 93.1 kg  93.1 kg   Height:  '5\' 10"'$  (1.778 m)       Intake/Output Summary (Last 24 hours) at 05/22/2022 0856 Last data filed at 05/22/2022 0300 Gross per 24 hour  Intake 664.6 ml  Output --  Net 664.6 ml      05/22/2022    5:00 AM 05/21/2022    9:34 PM 05/21/2022    4:13 PM  Last 3 Weights  Weight (lbs) 205 lb 4 oz 205 lb 4 oz 209 lb  Weight (kg) 93.1 kg 93.1 kg 94.802 kg     Body mass index is 29.45 kg/m.  General:  Well nourished, well developed, in no acute distress HEENT: normal Neck: no JVD Vascular: No carotid bruits; Distal pulses 2+ bilaterally Cardiac:  normal S1, S2; RRR; no murmur  Lungs:  clear to auscultation bilaterally, no wheezing, rhonchi or rales  Abd: soft, nontender, no hepatomegaly  Ext: no edema Musculoskeletal:  No deformities, BUE and BLE strength normal and equal Skin: warm and dry  Neuro:  CNs 2-12 intact, no focal abnormalities noted Psych:  Normal affect   EKG:  The EKG was personally reviewed and demonstrates:  Sinus rhythm, LVH Telemetry:  Telemetry was personally reviewed and demonstrates:  Sinus bradycardia   Relevant CV Studies:  Monitor 12/08/21 Patch Wear Time:  14 days and 0 hours (2023-02-04T20:13:24-0500 to 2023-02-18T20:13:24-0500)   Patient had a min HR of 40 bpm, max HR of 120 bpm, and avg HR of 63 bpm. Predominant underlying rhythm was Sinus Rhythm. Bundle Branch Block/IVCD was present. 1 run of Supraventricular Tachycardia occurred lasting 4 beats with a max rate of 106 bpm (avg  98 bpm). Isolated SVEs were rare (<1.0%), SVE Couplets were rare (<1.0%), and SVE Triplets were rare (<1.0%). Isolated VEs were rare (<1.0%), VE Couplets were rare (<1.0%), and no  VE Triplets were present.   1. SR/SB/ST 2. Occasional PACs/PVCs 3. Short run of SVT  Coronaries CT 12/04/21 IMPRESSION: 1. Calcium score 0   2.  Normal right dominant coronary arteries   3.  Normal ascending thoracic aorta 3.6 cm  Echo 11/17/21 1. Left ventricular ejection fraction, by estimation, is 60 to  65%. The  left ventricle has normal function. The left ventricle has no regional  wall motion abnormalities. There is mild concentric left ventricular  hypertrophy. Left ventricular diastolic  parameters are consistent with Grade I diastolic dysfunction (impaired  relaxation).   2. Right ventricular systolic function is normal. The right ventricular  size is normal.   3. Left atrial size was mild to moderately dilated.   4. The mitral valve is normal in structure. Trivial mitral valve  regurgitation. No evidence of mitral stenosis.   5. The aortic valve is tricuspid. Aortic valve regurgitation is not  visualized. No aortic stenosis is present.   6. Aortic dilatation noted. There is borderline dilatation of the  ascending aorta, measuring 36 mm.   7. The inferior vena cava is normal in size with greater than 50%  respiratory variability, suggesting right atrial pressure of 3 mmHg.   Laboratory Data:  High Sensitivity Troponin:   Recent Labs  Lab 05/21/22 1626 05/21/22 1826  TROPONINIHS 8 8     Chemistry Recent Labs  Lab 05/21/22 1626 05/22/22 0302  NA 139 140  K 4.3 3.6  CL 102 107  CO2 28 26  GLUCOSE 107* 212*  BUN 28* 26*  CREATININE 1.35* 1.32*  CALCIUM 10.2 9.3  MG  --  2.2  GFRNONAA 40* 41*  ANIONGAP 9 7    Recent Labs  Lab 05/22/22 0302  PROT 6.2*  ALBUMIN 3.3*  AST 16  ALT 12  ALKPHOS 60  BILITOT 0.3   Hematology Recent Labs  Lab 05/21/22 1626 05/22/22 0302  WBC 6.3 5.9  RBC 4.30 3.90  HGB 13.5 12.2  HCT 40.7 37.8  MCV 94.7 96.9  MCH 31.4 31.3  MCHC 33.2 32.3  RDW 13.2 13.2  PLT 161 142*   Thyroid  Recent Labs  Lab 05/22/22 0302  TSH 0.880    DDimer  Recent Labs  Lab 05/21/22 1700  DDIMER 2.00*     Radiology/Studies:  CT Angio Chest PE W and/or Wo Contrast  Result Date: 05/21/2022 CLINICAL DATA:  Chest pressure and fatigue x1 week. EXAM: CT ANGIOGRAPHY CHEST WITH CONTRAST TECHNIQUE: Multidetector CT imaging of the chest was  performed using the standard protocol during bolus administration of intravenous contrast. Multiplanar CT image reconstructions and MIPs were obtained to evaluate the vascular anatomy. RADIATION DOSE REDUCTION: This exam was performed according to the departmental dose-optimization program which includes automated exposure control, adjustment of the mA and/or kV according to patient size and/or use of iterative reconstruction technique. CONTRAST:  12m OMNIPAQUE IOHEXOL 350 MG/ML SOLN COMPARISON:  December 04, 2021 FINDINGS: Cardiovascular: There is mild calcification of the aortic arch, without evidence of aortic aneurysm. Satisfactory opacification of the pulmonary arteries to the segmental level. No evidence of pulmonary embolism. Normal heart size. No pericardial effusion. Mediastinum/Nodes: No enlarged mediastinal, hilar, or axillary lymph nodes. Thyroid gland, trachea, and esophagus demonstrate no significant findings. Lungs/Pleura: Mild dependent atelectatic changes are seen along the periphery of the bilateral lower lobes. There is no evidence of acute infiltrate, pleural effusion or pneumothorax. Upper Abdomen: There is a small to moderate sized hiatal hernia. Surgical sutures are seen within  the gastric region. Noninflamed diverticula are seen throughout the visualized portion of the large bowel. Musculoskeletal: No chest wall abnormality. No acute or significant osseous findings. Review of the MIP images confirms the above findings. IMPRESSION: 1. No evidence of pulmonary embolism or other acute intrathoracic process. 2. Small to moderate sized hiatal hernia. 3. Colonic diverticulosis. Aortic Atherosclerosis (ICD10-I70.0). Electronically Signed   By: Virgina Norfolk M.D.   On: 05/21/2022 18:22   DG Chest 2 View  Result Date: 05/21/2022 CLINICAL DATA:  Chest pressure and fatigue over the last week. EXAM: CHEST - 2 VIEW COMPARISON:  03/08/2021 FINDINGS: Heart size is normal. Aortic atherosclerosis as  seen previously. The pulmonary vascularity is normal. The lungs are clear. No effusions. No acute bone finding. IMPRESSION: No active cardiopulmonary disease. Electronically Signed   By: Nelson Chimes M.D.   On: 05/21/2022 16:48     Assessment and Plan:   Chest pain/dyspnea on exertion -Patient with recent reassuring workup with coronary CTA showing no evidence of CAD.  Calcium score was 0. Echocardiogram with preserved LV function. -Troponin negative -EKG without acute ischemic changes -No further ischemic workup needed  2.  Lightheadedness 3.  Bradycardia -Patient recently wore monitor showing sinus rhythm, sinus bradycardia and sinus tachycardia. -Her symptoms could be due to chronotropic incompetence but she is feeling much better after fluid hydration.  Agree with holding hydrochlorothiazide and losartan.  Consider orthostatic check. -If ongoing issue, could consider outpatient ETT -No indication for pacemaker currently  4.  AKI -Improving  5.  Hypertension -Per primary team    For questions or updates, please contact [place] Please consult [email or web address] for contact info under    Jarrett Soho, PA  05/22/2022 8:56 AM

## 2022-05-22 NOTE — Discharge Summary (Signed)
Physician Discharge Summary  [name] HBZ:[id number] DOB: Feb 15, 1941 DOA: 05/21/2022  PCP: Fanny Bien, MD  Admit date: 05/21/2022 Discharge date: 05/22/2022  Time spent: 35 minutes  Recommendations for Outpatient Follow-up:  Symptomatic bradycardia - Per cardiology Dr. Buford Dresser [facility] - Per cardiology thorough workup no further workup required -Cardiology does not believe chronotropic incompetence, However, if symptoms persist, could consider outpatient treadmill test. No indication for pacemaker at this time. -Patient stockings per cardiology - Hold HCTZ and losartan. -Follow-up with cardiology if symptoms persist -Cardiology/PCP to determine if/when to restart BP medication  Essential HTN - See symptomatic bradycardia  Dyspnea on exertion - See symptomatic bradycardia  Acute on CKD stage IIIa Lab Results  Component Value Date   CREATININE 1.32 (H) 05/22/2022   CREATININE 1.35 (H) 05/21/2022   CREATININE 0.96 11/16/2021   CREATININE 1.14 (H) 10/26/2021   CREATININE 1.07 (H) 03/09/2021  -Improved, PCP to monitor     Discharge Diagnoses:  Principal Problem:   Dyspnea on exertion Active Problems:   Essential hypertension   Sinus bradycardia   AKI (acute kidney injury) ([place])   HLD (hyperlipidemia)   Depression   Hiatal hernia   Acute renal failure superimposed on stage 3a chronic kidney disease ([place])   Discharge Condition: Stable  Diet recommendation: Heart healthy  Filed Weights   05/21/22 1613 05/21/22 2134 05/22/22 0500  Weight: 94.8 kg 93.1 kg 93.1 kg    History of present illness:  81 year old African-American female history of sinus bradycardia for many years, hypertension, rheumatoid arthritis who has had dyspnea exertion for several months.  Patient states that recently has gotten worse.  She states that she was started on HCTZ about a month ago for worsening blood pressure.  She has been on losartan for many years now.  She has  noticed increasing dyspnea on exertion with even minimal activity including just walking from the bedroom to the bathroom.  She gets short of breath.  She feels sweaty and has palpitations.  She states that her baseline heart rate is about 40 beats a minute.  She feels palpitations when her heart rate gets about 80 beats a minute.   She presented to the ER today due to fatigue and chest pressure.   Work-up in the ER was negative including CTPA which was negative for a PE.  She ruled out for ischemic disease with 2 sets of cardiac markers.   Cardiology was consulted and they did not recommend any further work-up.   Patient had a coronary CT within the last 6 months that was negative for any coronary disease.  She had a calcium score of 0.    Hospital Course:  See above    Discharge Exam: Vitals:   05/22/22 0500 05/22/22 0502 05/22/22 0900 05/22/22 1404  BP:  (!) 168/62 (!) 143/71 (!) 146/62  Pulse:  (!) 50 (!) 59 62  Resp:  '18 16 18  '$ Temp:  97.7 F (36.5 C) 98.2 F (36.8 C) 98.1 F (36.7 C)  TempSrc:  Oral Oral Oral  SpO2:  99% 94% 96%  Weight: 93.1 kg     Height:       Physical Exam:  General: A/O x 4 No acute respiratory distress Eyes: negative scleral hemorrhage, negative anisocoria, negative icterus ENT: Negative Runny nose, negative gingival bleeding, Neck:  Negative scars, masses, torticollis, lymphadenopathy, JVD Lungs: Clear to auscultation bilaterally without wheezes or crackles Cardiovascular: Regular rate and rhythm without murmur gallop or rub normal S1 and S2  Discharge Instructions   Allergies as of 05/22/2022       Reactions   Adalat [nifedipine] Other (See Comments)   Tired, Made pt "feel crazy in the head"   Hydrocodone Itching, Nausea And Vomiting   Meperidine Hcl Other (See Comments)   Demerol causes hallucinations   Naproxen Nausea And Vomiting        Medication List     STOP taking these medications    diclofenac Sodium 1 %  Gel Commonly known as: VOLTAREN   FLUoxetine 20 MG capsule Commonly known as: PROZAC   FLUoxetine 40 MG capsule Commonly known as: PROZAC   hydrochlorothiazide 25 MG tablet Commonly known as: HYDRODIURIL   losartan 50 MG tablet Commonly known as: COZAAR       TAKE these medications    acetaminophen 500 MG tablet Commonly known as: TYLENOL Take 1,000 mg by mouth 2 (two) times daily as needed for headache (back and knee pain).   albuterol 108 (90 Base) MCG/ACT inhaler Commonly known as: VENTOLIN HFA Inhale 1 puff into the lungs as needed for wheezing or shortness of breath.   brimonidine 0.15 % ophthalmic solution Commonly known as: ALPHAGAN Place 1 drop into both eyes in the morning and at bedtime.   cetirizine 10 MG tablet Commonly known as: ZYRTEC Take 10 mg by mouth at bedtime.   dexlansoprazole 60 MG capsule Commonly known as: DEXILANT Take 1 capsule (60 mg total) by mouth daily.   montelukast 10 MG tablet Commonly known as: SINGULAIR Take 10 mg by mouth at bedtime.   nystatin ointment Commonly known as: MYCOSTATIN Apply 1 application  topically See admin instructions. 1 application 1-2 times a day as needed for rash   nystatin powder Commonly known as: MYCOSTATIN/NYSTOP Apply 1 Application topically daily as needed (for rash).   rosuvastatin 40 MG tablet Commonly known as: CRESTOR Take 40 mg by mouth at bedtime.   sertraline 100 MG tablet Commonly known as: ZOLOFT Take 100 mg by mouth every morning.   sucralfate 1 g tablet Commonly known as: CARAFATE Take 1 tablet (1 g total) by mouth 4 (four) times daily -  with meals and at bedtime. What changed: when to take this   SYSTANE OP Place 1 drop into both eyes daily as needed (dry/irritated eyes).   Vitamin D3 250 MCG (10000 UT) capsule Take 10,000 Units by mouth daily.   VITAMIN D3 PO Take 3 capsules by mouth daily in the afternoon.       Allergies  Allergen Reactions   Adalat  [Nifedipine] Other (See Comments)    Tired, Made pt "feel crazy in the head"   Hydrocodone Itching and Nausea And Vomiting   Meperidine Hcl Other (See Comments)    Demerol causes hallucinations   Naproxen Nausea And Vomiting      The results of significant diagnostics from this hospitalization (including imaging, microbiology, ancillary and laboratory) are listed below for reference.    Significant Diagnostic Studies: CT Angio Chest PE W and/or Wo Contrast  Result Date: 05/21/2022 CLINICAL DATA:  Chest pressure and fatigue x1 week. EXAM: CT ANGIOGRAPHY CHEST WITH CONTRAST TECHNIQUE: Multidetector CT imaging of the chest was performed using the standard protocol during bolus administration of intravenous contrast. Multiplanar CT image reconstructions and MIPs were obtained to evaluate the vascular anatomy. RADIATION DOSE REDUCTION: This exam was performed according to the departmental dose-optimization program which includes automated exposure control, adjustment of the mA and/or kV according to patient size and/or use of iterative  reconstruction technique. CONTRAST:  25m OMNIPAQUE IOHEXOL 350 MG/ML SOLN COMPARISON:  December 04, 2021 FINDINGS: Cardiovascular: There is mild calcification of the aortic arch, without evidence of aortic aneurysm. Satisfactory opacification of the pulmonary arteries to the segmental level. No evidence of pulmonary embolism. Normal heart size. No pericardial effusion. Mediastinum/Nodes: No enlarged mediastinal, hilar, or axillary lymph nodes. Thyroid gland, trachea, and esophagus demonstrate no significant findings. Lungs/Pleura: Mild dependent atelectatic changes are seen along the periphery of the bilateral lower lobes. There is no evidence of acute infiltrate, pleural effusion or pneumothorax. Upper Abdomen: There is a small to moderate sized hiatal hernia. Surgical sutures are seen within the gastric region. Noninflamed diverticula are seen throughout the visualized  portion of the large bowel. Musculoskeletal: No chest wall abnormality. No acute or significant osseous findings. Review of the MIP images confirms the above findings. IMPRESSION: 1. No evidence of pulmonary embolism or other acute intrathoracic process. 2. Small to moderate sized hiatal hernia. 3. Colonic diverticulosis. Aortic Atherosclerosis (ICD10-I70.0). Electronically Signed   By: TVirgina NorfolkM.D.   On: 05/21/2022 18:22   DG Chest 2 View  Result Date: 05/21/2022 CLINICAL DATA:  Chest pressure and fatigue over the last week. EXAM: CHEST - 2 VIEW COMPARISON:  03/08/2021 FINDINGS: Heart size is normal. Aortic atherosclerosis as seen previously. The pulmonary vascularity is normal. The lungs are clear. No effusions. No acute bone finding. IMPRESSION: No active cardiopulmonary disease. Electronically Signed   By: MNelson ChimesM.D.   On: 05/21/2022 16:48    Microbiology: No results found for this or any previous visit (from the past 240 hour(s)).   Labs: Basic Metabolic Panel: Recent Labs  Lab 05/21/22 1626 05/22/22 0302  NA 139 140  K 4.3 3.6  CL 102 107  CO2 28 26  GLUCOSE 107* 212*  BUN 28* 26*  CREATININE 1.35* 1.32*  CALCIUM 10.2 9.3  MG  --  2.2   Liver Function Tests: Recent Labs  Lab 05/22/22 0302  AST 16  ALT 12  ALKPHOS 60  BILITOT 0.3  PROT 6.2*  ALBUMIN 3.3*   Recent Labs  Lab 05/21/22 1626  LIPASE 18   No results for input(s): "AMMONIA" in the last 168 hours. CBC: Recent Labs  Lab 05/21/22 1626 05/22/22 0302  WBC 6.3 5.9  NEUTROABS  --  3.0  HGB 13.5 12.2  HCT 40.7 37.8  MCV 94.7 96.9  PLT 161 142*   Cardiac Enzymes: No results for input(s): "CKTOTAL", "CKMB", "CKMBINDEX", "TROPONINI" in the last 168 hours. BNP: BNP (last 3 results) Recent Labs    10/26/21 1548  BNP 57.0    ProBNP (last 3 results) No results for input(s): "PROBNP" in the last 8760 hours.  CBG: No results for input(s): "GLUCAP" in the last 168  hours.     Signed:  CDia Crawford MD [facility]

## 2022-05-23 NOTE — Progress Notes (Unsigned)
Office Visit    Patient Name: [name] Date of Encounter: 05/23/2022  Primary Care Provider:  Fanny Bien, MD Primary Cardiologist:  Quay Burow, MD  Chief Complaint    81 year old female with a history of chest pain, bradycardia, hypertension, hyperlipidemia, hiatal hernia with esophageal dysmotility, fibromyalgia, and OSA on CPAP who presents for follow-up related to chest pain and shortness of breath.  Past Medical History    Past Medical History:  Diagnosis Date   Allergy    Anal or rectal pain    sometimes   Anemia    hx of during pregnancy   Anxiety    Arthritis    Asthma    hx of   Bradycardia    " I KNOW I HAVE BRADYCARDIA ESPECIALLY WHEN I SLEEP"    Cataract 2021   bilateral eyes   Chronic back pain    Degenerative joint disease    osteo   Depression    Diabetes mellitus without complication ([place])    DM type II   Diverticulosis 2003   Dysrhythmia    hx of  due to eye drop and also low heart rate 40's per pt. Dr. Gwenlyn Found follows   Elevated total protein    Esophageal dysmotility    Fibromyalgia    GERD (gastroesophageal reflux disease)    subsequent Nissen Fundoplication   Glaucoma    Hearing loss    Heart murmur    "was told she had a heart murmur"   Hemorrhoids    Hiatal hernia 11/08/09   Hx of adenomatous colonic polyps 07/02/02   Hypercalcemia    Hyperlipidemia    Hyperlipidemia    Hypertension    Nausea    Osteoporosis    PONV (postoperative nausea and vomiting)    Rectal bleeding    from hemorrhoids.     Sleep apnea    DOES USE CPAP    Thrombocytopenia (HCC)    Varicose veins of left lower extremity    Past Surgical History:  Procedure Laterality Date   CARDIAC CATHETERIZATION  03/14/1992   Normal cardiac cath. Normal LV function.   CARDIOVASCULAR STRESS TEST  01/22/2011   No scintigraphic evidence of inducible ischemia.   CAROTID DOPPLER  03/31/2007   Bilateral ICAs - no evidence of significant diameter reduction, dissectin,  tortuosity, FMD, or any other vascular abnormality.   CATARACT EXTRACTION, BILATERAL     ESOPHAGEAL MANOMETRY N/A 11/14/2015   Procedure: ESOPHAGEAL MANOMETRY (EM);  Surgeon: Mauri Pole, MD;  Location: [facility];  Service: Endoscopy;  Laterality: N/A;   ESOPHAGEAL MANOMETRY N/A 01/18/2021   Procedure: ESOPHAGEAL MANOMETRY (EM);  Surgeon: Mauri Pole, MD;  Location: [facility];  Service: Endoscopy;  Laterality: N/A;   ESOPHAGOGASTRODUODENOSCOPY N/A 03/08/2021   Procedure: ESOPHAGOGASTRODUODENOSCOPY (EGD);  Surgeon: Lajuana Matte, MD;  Location: [facility];  Service: Thoracic;  Laterality: N/A;   EYE SURGERY     bilateral cataracts; bilateral stents   GASTRIC RESECTION  2009   GLAUCOMA SURGERY     JOINT REPLACEMENT     right knee Dr. Wynelle Link 06-23-18   KNEE SURGERY Bilateral    NISSEN FUNDOPLICATION  7673   with subsequent takedown in 2009   [place] Left 06/10/2017   Procedure: LEFT  TOTAL KNEE ARTHROPLASTY;  Surgeon: Gaynelle Arabian, MD;  Location: [facility];  Service: Orthopedics;  Laterality: Left;  Adductor Block   TOTAL KNEE ARTHROPLASTY Right 06/23/2018   Procedure: RIGHT TOTAL KNEE ARTHROPLASTY;  Surgeon:  Gaynelle Arabian, MD;  Location: [facility];  Service: Orthopedics;  Laterality: Right;   TRANSTHORACIC ECHOCARDIOGRAM  12/21/2010   EF 60%, moderate LVH,    TUBAL LIGATION     XI ROBOTIC ASSISTED HIATAL HERNIA REPAIR N/A 03/08/2021   Procedure: XI ROBOTIC ASSISTED LAPAROSCOPY WITH LYSIS OF ADHESIONS;  Surgeon: Lajuana Matte, MD;  Location: [facility];  Service: Thoracic;  Laterality: N/A;    Allergies  Allergies  Allergen Reactions   Adalat [Nifedipine] Other (See Comments)    Tired, Made pt "feel crazy in the head"   Hydrocodone Itching and Nausea And Vomiting   Meperidine Hcl Other (See Comments)    Demerol causes hallucinations   Naproxen Nausea And Vomiting    History of Present Illness    81 year old female with the above past medical history  including chest pain, bradycardia, hypertension, hyperlipidemia, hiatal hernia with esophageal dysmotility, fibromyalgia, and OSA on CPAP.  She was hospitalized in October 2021 with chest pain and shortness of breath.  2D echocardiogram was unremarkable, chest CTA showed no evidence of pulmonary embolism.  Return to the ED in January 2023 with presyncope-thought to be related to a viral syndrome.  She has chronic intermittent chest pain and thought to be related to her hiatal hernia.  She had an attempted repair of this however, the procedure was aborted.  Her chest pain is nonexertional and radiates to her neck.  Further cardiac workup in February 2023 unrevealing including 14-day Zio patch which showed normal sinus rhythm, no significant arrhythmia.  Coronary CT angiogram showed normal coronary arteries, calcium score of 0.  Echocardiogram showed EF 60 to 60%, mild LVH, G1 DD, normal RV, no significant valvular disease.  She was last seen in the office on 01/03/2022 and was stable from a cardiac standpoint.  Blood pressure was somewhat elevated.  She presented to the ED on 05/21/2022 with a 1 week history of mitten exertional shortness of breath, fatigue, pressure, diaphoresis and lightheadedness.  Her symptoms were different from her prior symptoms.  Cardiology was consulted.  Troponin was negative.  EKG was without ischemic changes.  It was thought that her symptoms could possibly be due to chronotropic incompetence (she has a history of bradycardia, HR in the 40s at baseline), however, she felt much better after hydration was evidence of elevated heart rate with activity.  No indication for pacemaker at the time.  She was advised to follow-up with cardiology as an outpatient with possible consideration of outpatient ETT should her symptoms persist.  Hydrochlorothiazide and losartan were held in the setting of AKI.  Discharged home in stable condition on 05/22/2022.  She presents today for follow-up.  Since her  recent ED visit  Chest pain/shortness of breath/bradycardia: AKI: Hypertension: Hyperlipidemia: OSA: Hiatal hernia: Disposition:  Home Medications    Current Outpatient Medications  Medication Sig Dispense Refill   acetaminophen (TYLENOL) 500 MG tablet Take 1,000 mg by mouth 2 (two) times daily as needed for headache (back and knee pain).     albuterol (VENTOLIN HFA) 108 (90 Base) MCG/ACT inhaler Inhale 1 puff into the lungs as needed for wheezing or shortness of breath.     brimonidine (ALPHAGAN) 0.15 % ophthalmic solution Place 1 drop into both eyes in the morning and at bedtime.     cetirizine (ZYRTEC) 10 MG tablet Take 10 mg by mouth at bedtime.     Cholecalciferol (VITAMIN D3 PO) Take 3 capsules by mouth daily in the afternoon.     Cholecalciferol (VITAMIN  D3) 250 MCG (10000 UT) capsule Take 10,000 Units by mouth daily. (Patient not taking: Reported on 05/22/2022)     dexlansoprazole (DEXILANT) 60 MG capsule Take 1 capsule (60 mg total) by mouth daily. 90 capsule 3   montelukast (SINGULAIR) 10 MG tablet Take 10 mg by mouth at bedtime.     nystatin (MYCOSTATIN/NYSTOP) powder Apply 1 Application topically daily as needed (for rash).  3   nystatin ointment (MYCOSTATIN) Apply 1 application  topically See admin instructions. 1 application 1-2 times a day as needed for rash  0   Polyethyl Glycol-Propyl Glycol (SYSTANE OP) Place 1 drop into both eyes daily as needed (dry/irritated eyes).     rosuvastatin (CRESTOR) 40 MG tablet Take 40 mg by mouth at bedtime.     sertraline (ZOLOFT) 100 MG tablet Take 100 mg by mouth every morning.     sucralfate (CARAFATE) 1 g tablet Take 1 tablet (1 g total) by mouth 4 (four) times daily -  with meals and at bedtime. (Patient taking differently: Take 1 g by mouth at bedtime.) 120 tablet 11   No current facility-administered medications for this visit.     Review of Systems    ***.  All other systems reviewed and are otherwise negative except as noted  above.    Physical Exam    VS:  There were no vitals taken for this visit. , BMI There is no height or weight on file to calculate BMI.     GEN: Well nourished, well developed, in no acute distress. HEENT: normal. Neck: Supple, no JVD, carotid bruits, or masses. Cardiac: RRR, no murmurs, rubs, or gallops. No clubbing, cyanosis, edema.  Radials/DP/PT 2+ and equal bilaterally.  Respiratory:  Respirations regular and unlabored, clear to auscultation bilaterally. GI: Soft, nontender, nondistended, BS + x 4. MS: no deformity or atrophy. Skin: warm and dry, no rash. Neuro:  Strength and sensation are intact. Psych: Normal affect.  Accessory Clinical Findings    ECG personally reviewed by me today - *** - no acute changes.  Lab Results  Component Value Date   WBC 5.9 05/22/2022   HGB 12.2 05/22/2022   HCT 37.8 05/22/2022   MCV 96.9 05/22/2022   PLT 142 (L) 05/22/2022   Lab Results  Component Value Date   CREATININE 1.32 (H) 05/22/2022   BUN 26 (H) 05/22/2022   NA 140 05/22/2022   K 3.6 05/22/2022   CL 107 05/22/2022   CO2 26 05/22/2022   Lab Results  Component Value Date   ALT 12 05/22/2022   AST 16 05/22/2022   ALKPHOS 60 05/22/2022   BILITOT 0.3 05/22/2022   Lab Results  Component Value Date   CHOL 200 (H) 10/26/2020   HDL 76 10/26/2020   LDLCALC 115 (H) 10/26/2020   TRIG 46 10/26/2020   CHOLHDL 2.6 10/26/2020    Lab Results  Component Value Date   HGBA1C (H) 12/20/2010    6.3 (NOTE)                                                                       According to the ADA Clinical Practice Recommendations for 2011, when HbA1c is used as a screening test:   >=6.5%   Diagnostic of  Diabetes Mellitus           (if abnormal result  is confirmed)  5.7-6.4%   Increased risk of developing Diabetes Mellitus  References:Diagnosis and Classification of Diabetes Mellitus,Diabetes [id number],[id number](ELMRA 1):S62-S69 and Standards of Medical Care in         Diabetes - 2011,Diabetes  [id number],35  (Suppl 1):S11-S61.    Assessment & Plan    1.  ***   Lenna Sciara, NP 05/23/2022, 8:29 PM

## 2022-05-24 ENCOUNTER — Ambulatory Visit (INDEPENDENT_AMBULATORY_CARE_PROVIDER_SITE_OTHER): Payer: Medicare Other | Admitting: Nurse Practitioner

## 2022-05-24 ENCOUNTER — Encounter: Payer: Self-pay | Admitting: Nurse Practitioner

## 2022-05-24 ENCOUNTER — Other Ambulatory Visit: Payer: Self-pay

## 2022-05-24 VITALS — BP 118/84 | HR 57 | Ht 70.0 in | Wt 206.8 lb

## 2022-05-24 DIAGNOSIS — R079 Chest pain, unspecified: Secondary | ICD-10-CM

## 2022-05-24 DIAGNOSIS — K449 Diaphragmatic hernia without obstruction or gangrene: Secondary | ICD-10-CM | POA: Diagnosis not present

## 2022-05-24 DIAGNOSIS — H3581 Retinal edema: Secondary | ICD-10-CM | POA: Diagnosis not present

## 2022-05-24 DIAGNOSIS — H401113 Primary open-angle glaucoma, right eye, severe stage: Secondary | ICD-10-CM | POA: Diagnosis not present

## 2022-05-24 DIAGNOSIS — I10 Essential (primary) hypertension: Secondary | ICD-10-CM

## 2022-05-24 DIAGNOSIS — R001 Bradycardia, unspecified: Secondary | ICD-10-CM

## 2022-05-24 DIAGNOSIS — R0602 Shortness of breath: Secondary | ICD-10-CM | POA: Diagnosis not present

## 2022-05-24 DIAGNOSIS — E782 Mixed hyperlipidemia: Secondary | ICD-10-CM

## 2022-05-24 DIAGNOSIS — G4733 Obstructive sleep apnea (adult) (pediatric): Secondary | ICD-10-CM | POA: Diagnosis not present

## 2022-05-24 DIAGNOSIS — N179 Acute kidney failure, unspecified: Secondary | ICD-10-CM | POA: Diagnosis not present

## 2022-05-24 DIAGNOSIS — H35372 Puckering of macula, left eye: Secondary | ICD-10-CM | POA: Diagnosis not present

## 2022-05-24 DIAGNOSIS — H18513 Endothelial corneal dystrophy, bilateral: Secondary | ICD-10-CM | POA: Diagnosis not present

## 2022-05-24 NOTE — Addendum Note (Signed)
Addended by: Lenna Sciara on: 05/24/2022 09:48 AM   Modules accepted: Orders

## 2022-05-24 NOTE — Patient Instructions (Addendum)
Medication Instructions:  Your physician recommends that you continue on your current medications as directed. Please refer to the Current Medication list given to you today.   *If you need a refill on your cardiac medications before your next appointment, please call your pharmacy*   Lab Work: Your physician recommends that you return for lab work in 1 week. BMET  If you have labs (blood work) drawn today and your tests are completely normal, you will receive your results only by: [place] (if you have MyChart) OR A paper copy in the mail If you have any lab test that is abnormal or we need to change your treatment, we will call you to review the results.   Testing/Procedures: Your physician has requested that you have an exercise tolerance test. For further information please visit [email or web address]. Please also follow instruction sheet, as given.   Follow-Up: At [facility], you and your health needs are our priority.  As part of our continuing mission to provide you with exceptional heart care, we have created designated Provider Care Teams.  These Care Teams include your primary Cardiologist (physician) and Advanced Practice Providers (APPs -  Physician Assistants and Nurse Practitioners) who all work together to provide you with the care you need, when you need it.  We recommend signing up for the patient portal called "MyChart".  Sign up information is provided on this After Visit Summary.  MyChart is used to connect with patients for Virtual Visits (Telemedicine).  Patients are able to view lab/test results, encounter notes, upcoming appointments, etc.  Non-urgent messages can be sent to your provider as well.   To learn more about what you can do with MyChart, go to [email or web address].    Your next appointment:   6-8 week(s)  The format for your next appointment:   In Person  Provider:   Quay Burow, MD  or Diona Browner, NP        Other  Instructions   Important Information About Sugar

## 2022-05-25 DIAGNOSIS — E119 Type 2 diabetes mellitus without complications: Secondary | ICD-10-CM | POA: Diagnosis not present

## 2022-05-25 DIAGNOSIS — Z1231 Encounter for screening mammogram for malignant neoplasm of breast: Secondary | ICD-10-CM | POA: Diagnosis not present

## 2022-05-25 DIAGNOSIS — I10 Essential (primary) hypertension: Secondary | ICD-10-CM | POA: Diagnosis not present

## 2022-05-25 DIAGNOSIS — R55 Syncope and collapse: Secondary | ICD-10-CM | POA: Diagnosis not present

## 2022-05-25 DIAGNOSIS — R634 Abnormal weight loss: Secondary | ICD-10-CM | POA: Diagnosis not present

## 2022-05-25 DIAGNOSIS — R079 Chest pain, unspecified: Secondary | ICD-10-CM | POA: Diagnosis not present

## 2022-05-25 LAB — BASIC METABOLIC PANEL
BUN/Creatinine Ratio: 17 (ref 12–28)
BUN: 20 mg/dL (ref 8–27)
CO2: 27 mmol/L (ref 20–29)
Calcium: 9.9 mg/dL (ref 8.7–10.3)
Chloride: 102 mmol/L (ref 96–106)
Creatinine, Ser: 1.16 mg/dL — ABNORMAL HIGH (ref 0.57–1.00)
Glucose: 103 mg/dL — ABNORMAL HIGH (ref 70–99)
Potassium: 4.7 mmol/L (ref 3.5–5.2)
Sodium: 141 mmol/L (ref 134–144)
eGFR: 48 mL/min/{1.73_m2} — ABNORMAL LOW (ref >59)

## 2022-05-29 ENCOUNTER — Telehealth (HOSPITAL_COMMUNITY): Payer: Self-pay | Admitting: *Deleted

## 2022-05-29 DIAGNOSIS — E1165 Type 2 diabetes mellitus with hyperglycemia: Secondary | ICD-10-CM | POA: Diagnosis not present

## 2022-05-29 DIAGNOSIS — Z23 Encounter for immunization: Secondary | ICD-10-CM | POA: Diagnosis not present

## 2022-05-29 DIAGNOSIS — I10 Essential (primary) hypertension: Secondary | ICD-10-CM | POA: Diagnosis not present

## 2022-05-29 DIAGNOSIS — E1121 Type 2 diabetes mellitus with diabetic nephropathy: Secondary | ICD-10-CM | POA: Diagnosis not present

## 2022-05-29 DIAGNOSIS — R001 Bradycardia, unspecified: Secondary | ICD-10-CM | POA: Diagnosis not present

## 2022-05-29 NOTE — Telephone Encounter (Signed)
Close encounter 

## 2022-05-30 ENCOUNTER — Ambulatory Visit (HOSPITAL_COMMUNITY)
Admission: RE | Admit: 2022-05-30 | Discharge: 2022-05-30 | Disposition: A | Discharge status: Home / Self Care | Payer: Medicare Other | Source: Ambulatory Visit | Attending: Internal Medicine | Admitting: Internal Medicine

## 2022-05-30 ENCOUNTER — Telehealth: Payer: Self-pay

## 2022-05-30 DIAGNOSIS — R079 Chest pain, unspecified: Secondary | ICD-10-CM

## 2022-05-30 DIAGNOSIS — R001 Bradycardia, unspecified: Secondary | ICD-10-CM | POA: Diagnosis not present

## 2022-05-30 DIAGNOSIS — R0602 Shortness of breath: Secondary | ICD-10-CM | POA: Diagnosis not present

## 2022-05-30 LAB — EXERCISE TOLERANCE TEST
Angina Index: 2
Estimated workload: 4.6
Exercise duration (min): 2 min
Exercise duration (sec): 36 s
MPHR: 141 {beats}/min
Peak HR: 114 {beats}/min
Percent HR: 80 %
Rest HR: 58 {beats}/min

## 2022-05-30 NOTE — Telephone Encounter (Signed)
Left a detailed message about lab results. Pt can call back with any questions or concerns.

## 2022-06-05 ENCOUNTER — Telehealth: Payer: Self-pay

## 2022-06-05 NOTE — Telephone Encounter (Signed)
Pt returned call. Pt was notified of results and will follow up as planned.

## 2022-06-05 NOTE — Telephone Encounter (Signed)
LMOM, waiting on a return call to discuss exercise tolerance test results.

## 2022-06-08 ENCOUNTER — Telehealth: Payer: Self-pay | Admitting: Nurse Practitioner

## 2022-06-08 DIAGNOSIS — R55 Syncope and collapse: Secondary | ICD-10-CM | POA: Diagnosis not present

## 2022-06-08 DIAGNOSIS — I10 Essential (primary) hypertension: Secondary | ICD-10-CM | POA: Diagnosis not present

## 2022-06-08 DIAGNOSIS — R630 Anorexia: Secondary | ICD-10-CM | POA: Diagnosis not present

## 2022-06-08 DIAGNOSIS — E119 Type 2 diabetes mellitus without complications: Secondary | ICD-10-CM | POA: Diagnosis not present

## 2022-06-08 NOTE — Telephone Encounter (Signed)
New Message:      Berna Bue wants  Raquel Sarna to please give her a call today, concerning this patient please.

## 2022-06-12 DIAGNOSIS — R55 Syncope and collapse: Secondary | ICD-10-CM | POA: Diagnosis not present

## 2022-06-12 DIAGNOSIS — Z9189 Other specified personal risk factors, not elsewhere classified: Secondary | ICD-10-CM | POA: Diagnosis not present

## 2022-06-12 DIAGNOSIS — H903 Sensorineural hearing loss, bilateral: Secondary | ICD-10-CM | POA: Diagnosis not present

## 2022-06-12 DIAGNOSIS — Z822 Family history of deafness and hearing loss: Secondary | ICD-10-CM | POA: Diagnosis not present

## 2022-06-12 DIAGNOSIS — I10 Essential (primary) hypertension: Secondary | ICD-10-CM | POA: Diagnosis not present

## 2022-06-12 DIAGNOSIS — E119 Type 2 diabetes mellitus without complications: Secondary | ICD-10-CM | POA: Diagnosis not present

## 2022-06-12 DIAGNOSIS — Z5982 Transportation insecurity: Secondary | ICD-10-CM | POA: Diagnosis not present

## 2022-06-12 DIAGNOSIS — Z77122 Contact with and (suspected) exposure to noise: Secondary | ICD-10-CM | POA: Diagnosis not present

## 2022-06-15 DIAGNOSIS — R922 Inconclusive mammogram: Secondary | ICD-10-CM | POA: Diagnosis not present

## 2022-06-15 DIAGNOSIS — R928 Other abnormal and inconclusive findings on diagnostic imaging of breast: Secondary | ICD-10-CM | POA: Diagnosis not present

## 2022-06-22 DIAGNOSIS — E119 Type 2 diabetes mellitus without complications: Secondary | ICD-10-CM | POA: Diagnosis not present

## 2022-06-22 DIAGNOSIS — R42 Dizziness and giddiness: Secondary | ICD-10-CM | POA: Diagnosis not present

## 2022-06-22 DIAGNOSIS — F411 Generalized anxiety disorder: Secondary | ICD-10-CM | POA: Diagnosis not present

## 2022-06-22 DIAGNOSIS — R5383 Other fatigue: Secondary | ICD-10-CM | POA: Diagnosis not present

## 2022-06-22 DIAGNOSIS — I10 Essential (primary) hypertension: Secondary | ICD-10-CM | POA: Diagnosis not present

## 2022-06-29 DIAGNOSIS — E119 Type 2 diabetes mellitus without complications: Secondary | ICD-10-CM | POA: Diagnosis not present

## 2022-06-29 DIAGNOSIS — R42 Dizziness and giddiness: Secondary | ICD-10-CM | POA: Diagnosis not present

## 2022-06-29 DIAGNOSIS — I10 Essential (primary) hypertension: Secondary | ICD-10-CM | POA: Diagnosis not present

## 2022-07-02 DIAGNOSIS — H903 Sensorineural hearing loss, bilateral: Secondary | ICD-10-CM | POA: Diagnosis not present

## 2022-07-04 ENCOUNTER — Encounter: Payer: Self-pay | Admitting: Cardiovascular Disease

## 2022-07-04 ENCOUNTER — Ambulatory Visit: Payer: Medicare Other | Attending: Nurse Practitioner | Admitting: Cardiovascular Disease

## 2022-07-04 ENCOUNTER — Ambulatory Visit: Payer: Medicare Other | Admitting: Nurse Practitioner

## 2022-07-04 DIAGNOSIS — G4733 Obstructive sleep apnea (adult) (pediatric): Secondary | ICD-10-CM | POA: Diagnosis not present

## 2022-07-04 DIAGNOSIS — R0609 Other forms of dyspnea: Secondary | ICD-10-CM | POA: Insufficient documentation

## 2022-07-04 DIAGNOSIS — E782 Mixed hyperlipidemia: Secondary | ICD-10-CM | POA: Diagnosis not present

## 2022-07-04 DIAGNOSIS — I209 Angina pectoris, unspecified: Secondary | ICD-10-CM

## 2022-07-04 DIAGNOSIS — I10 Essential (primary) hypertension: Secondary | ICD-10-CM | POA: Insufficient documentation

## 2022-07-04 NOTE — Assessment & Plan Note (Signed)
History of dyspnea on exertion with remote history of normal coronary arteries by heart cath performed by Dr. Melvern Banker 03/14/1992.  She had a coronary CTA performed 12/04/2021 that revealed a coronary calcium score 0 with normal coronary arteries.  2D echo was normal as well.  I do not think her symptoms are cardiovascular in nature.

## 2022-07-04 NOTE — Assessment & Plan Note (Signed)
History of obstructive sleep apnea on CPAP. 

## 2022-07-04 NOTE — Assessment & Plan Note (Signed)
History of hyperlipidemia on statin therapy followed by her PCP. 

## 2022-07-04 NOTE — Progress Notes (Signed)
07/04/2022 Caryl Asp [name]   [birth year]  [id number]  Primary Physician Fanny Bien, MD Primary Cardiologist: Lorretta Harp MD FACP, Nina, [place], [place]  HPI:  [name] is a 81 y.o.  mildly overweight, married [place] American female, mother of 44, grandmother to 21 grandchildren, whom I last saw 02/21/2021.  She is accompanied by her daughter [name] today.  She is referred back today for preoperative clearance before elective right total knee replacement scheduled to be performed by Dr. Maureen Ralphs on September 9.  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  She has a history of hyperlipidemia. She was admitted to [facility] on December 20, 2010, with presyncope. Her symptoms were heralded by an episode of nausea. Her other history is remarkable for degenerative joint disease, depression, and fibromyalgia as well as esophageal dysmotility, status post Nissen fundoplication in the past. A 2D echocardiogram was normal. She was complaining of some chest pain at that time, and a Myoview stress test performed in our office on January 22, 2011, was normal as well.  An event monitor was performed  That showed heart rates in the 40s especially while she was asleep.[name] Recently she's been symptomatic from this. She is on Timalol eyeddrops. Dr. Ernie Hew follows her blood work including lipid profile.   She  did have a right total knee replacement by Dr. Maureen Ralphs which she has had rehabilitation for and is walking better.    She was admitted to [facility] on 07/24/2020 with chest pain and shortness of breath.  Her cardiac enzymes were negative.  Her EKG showed no acute changes.  She was seen by Dr. Skeet Latch and Dr. Johnsie Cancel.  2D echo was unremarkable and chest CTA showed no evidence of pulmonary embolism.  She was discharged home and has had no recurrent symptoms.   She apparently had attempted fundoplasty robotically by Dr. Kipp Brood last year but this was aborted mid procedure  because of hemodynamic issues.  She was recently admitted to the hospital for 1 day on 05/21/2022 with fatigue, dizziness and dehydration.  She was also mildly bradycardic.  Since that time her hydrochlorothiazide was discontinued, her losartan uptitrated and she feels clinically improved.  She does have a remote history of normal cardiac catheterization by Dr. Melvern Banker  03/14/1992, coronary CTA performed 12/04/2021 that revealed a coronary calcium score of 0 with normal coronary arteries and a normal 2D echo.  A recent event monitor showed essentially normal rhythm with occasional PVCs, PACs and short runs of SVT with a mean heart rate of 63.   Current Meds  Medication Sig   acetaminophen (TYLENOL) 500 MG tablet Take 1,000 mg by mouth 2 (two) times daily as needed for headache (back and knee pain).   albuterol (VENTOLIN HFA) 108 (90 Base) MCG/ACT inhaler Inhale 1 puff into the lungs as needed for wheezing or shortness of breath.   brimonidine (ALPHAGAN) 0.15 % ophthalmic solution Place 1 drop into both eyes in the morning and at bedtime.   cetirizine (ZYRTEC) 10 MG tablet Take 10 mg by mouth at bedtime.   Cholecalciferol (VITAMIN D3 PO) Take 3 capsules by mouth daily in the afternoon.   Cholecalciferol (VITAMIN D3) 250 MCG (10000 UT) capsule Take 10,000 Units by mouth daily.   dexlansoprazole (DEXILANT) 60 MG capsule Take 1 capsule (60 mg total) by mouth daily.   losartan (COZAAR) 50 MG tablet Take 50 mg by mouth daily.   montelukast (SINGULAIR) 10 MG tablet  Take 10 mg by mouth at bedtime.   nystatin (MYCOSTATIN/NYSTOP) powder Apply 1 Application topically daily as needed (for rash).   nystatin ointment (MYCOSTATIN) Apply 1 application  topically See admin instructions. 1 application 1-2 times a day as needed for rash   Polyethyl Glycol-Propyl Glycol (SYSTANE OP) Place 1 drop into both eyes daily as needed (dry/irritated eyes).   rosuvastatin (CRESTOR) 40 MG tablet Take 40 mg by mouth at bedtime.    sertraline (ZOLOFT) 100 MG tablet Take 100 mg by mouth every morning.   sucralfate (CARAFATE) 1 g tablet Take 1 tablet (1 g total) by mouth 4 (four) times daily -  with meals and at bedtime. (Patient taking differently: Take 1 g by mouth at bedtime.)     Allergies  Allergen Reactions   Adalat [Nifedipine] Other (See Comments)    Tired, Made pt "feel crazy in the head"   Hydrocodone Itching and Nausea And Vomiting   Meperidine Hcl Other (See Comments)    Demerol causes hallucinations   Naproxen Nausea And Vomiting    Social History   Socioeconomic History   Marital status: Married    Spouse name: Not on file   Number of children: 8   Years of education: Not on file   Highest education level: Not on file  Occupational History   Occupation: retired    Comment: retired Therapist, art.   Tobacco Use   Smoking status: Never   Smokeless tobacco: Never  Vaping Use   Vaping Use: Never used  Substance and Sexual Activity   Alcohol use: No    Alcohol/week: 0.0 standard drinks of alcohol   Drug use: No   Sexual activity: Yes  Other Topics Concern   Not on file  Social History Narrative   Husband, Adeli Frost is Next of Kin. Cell # [phone]   Social Determinants of Health   Financial Resource Strain: Not on file  Food Insecurity: Not on file  Transportation Needs: Not on file  Physical Activity: Not on file  Stress: Not on file  Social Connections: Not on file  Intimate Partner Violence: Not on file     Review of Systems: General: negative for chills, fever, night sweats or weight changes.  Cardiovascular: negative for chest pain, dyspnea on exertion, edema, orthopnea, palpitations, paroxysmal nocturnal dyspnea or shortness of breath Dermatological: negative for rash Respiratory: negative for cough or wheezing Urologic: negative for hematuria Abdominal: negative for nausea, vomiting, diarrhea, bright red blood per rectum, melena, or hematemesis Neurologic: negative for  visual changes, syncope, or dizziness All other systems reviewed and are otherwise negative except as noted above.    Blood pressure 134/68, pulse 62, height 5' 10.5" (1.791 m), weight 212 lb (96.2 kg).  General appearance: alert and no distress Neck: no adenopathy, no carotid bruit, no JVD, supple, symmetrical, trachea midline, and thyroid not enlarged, symmetric, no tenderness/mass/nodules Lungs: clear to auscultation bilaterally Heart: regular rate and rhythm, S1, S2 normal, no murmur, click, rub or gallop Extremities: extremities normal, atraumatic, no cyanosis or edema Pulses: 2+ and symmetric Skin: Skin color, texture, turgor normal. No rashes or lesions Neurologic: Grossly normal  EKG not performed today  ASSESSMENT AND PLAN:   HLD (hyperlipidemia) History of hyperlipidemia on statin therapy followed by her PCP  Sleep apnea History of obstructive sleep apnea on CPAP  Essential hypertension History of essential hypertension previously on hydrochlorothiazide and losartan.  She was orthostatic and her primary care physician, Dr. Willette Pa , recently discontinued her hydrochlorothiazide and  increase her losartan dose from 25 to 50 mg a day.  She feels less lightheaded and clinically improved.  Dyspnea on exertion History of dyspnea on exertion with remote history of normal coronary arteries by heart cath performed by Dr. Melvern Banker 03/14/1992.  She had a coronary CTA performed 12/04/2021 that revealed a coronary calcium score 0 with normal coronary arteries.  2D echo was normal as well.  I do not think her symptoms are cardiovascular in nature.     Lorretta Harp MD FACP,FACC,FAHA, [facility] 07/04/2022 10:25 AM

## 2022-07-04 NOTE — Assessment & Plan Note (Signed)
History of essential hypertension previously on hydrochlorothiazide and losartan.  She was orthostatic and her primary care physician, Dr. Willette Pa , recently discontinued her hydrochlorothiazide and increase her losartan dose from 25 to 50 mg a day.  She feels less lightheaded and clinically improved.

## 2022-07-04 NOTE — Patient Instructions (Signed)
Medication Instructions:  Your physician recommends that you continue on your current medications as directed. Please refer to the Current Medication list given to you today.  *If you need a refill on your cardiac medications before your next appointment, please call your pharmacy*   Follow-Up: At [facility], you and your health needs are our priority.  As part of our continuing mission to provide you with exceptional heart care, we have created designated Provider Care Teams.  These Care Teams include your primary Cardiologist (physician) and Advanced Practice Providers (APPs -  Physician Assistants and Nurse Practitioners) who all work together to provide you with the care you need, when you need it.  We recommend signing up for the patient portal called "MyChart".  Sign up information is provided on this After Visit Summary.  MyChart is used to connect with patients for Virtual Visits (Telemedicine).  Patients are able to view lab/test results, encounter notes, upcoming appointments, etc.  Non-urgent messages can be sent to your provider as well.   To learn more about what you can do with MyChart, go to [email or web address].    Your next appointment:   6 month(s)  The format for your next appointment:   In Person  Provider:   Emily Monge, NP        Then, Jonathan Berry, MD will plan to see you again in 12 month(s).  

## 2022-07-09 DIAGNOSIS — F411 Generalized anxiety disorder: Secondary | ICD-10-CM | POA: Diagnosis not present

## 2022-07-09 DIAGNOSIS — I10 Essential (primary) hypertension: Secondary | ICD-10-CM | POA: Diagnosis not present

## 2022-07-09 DIAGNOSIS — G4700 Insomnia, unspecified: Secondary | ICD-10-CM | POA: Diagnosis not present

## 2022-07-09 DIAGNOSIS — H00014 Hordeolum externum left upper eyelid: Secondary | ICD-10-CM | POA: Diagnosis not present

## 2022-07-09 DIAGNOSIS — Z23 Encounter for immunization: Secondary | ICD-10-CM | POA: Diagnosis not present

## 2022-07-09 DIAGNOSIS — F331 Major depressive disorder, recurrent, moderate: Secondary | ICD-10-CM | POA: Diagnosis not present

## 2022-07-10 DIAGNOSIS — H35371 Puckering of macula, right eye: Secondary | ICD-10-CM | POA: Diagnosis not present

## 2022-07-10 DIAGNOSIS — H00014 Hordeolum externum left upper eyelid: Secondary | ICD-10-CM | POA: Diagnosis not present

## 2022-07-17 DIAGNOSIS — H18513 Endothelial corneal dystrophy, bilateral: Secondary | ICD-10-CM | POA: Diagnosis not present

## 2022-07-17 DIAGNOSIS — H04123 Dry eye syndrome of bilateral lacrimal glands: Secondary | ICD-10-CM | POA: Diagnosis not present

## 2022-07-17 DIAGNOSIS — H00014 Hordeolum externum left upper eyelid: Secondary | ICD-10-CM | POA: Diagnosis not present

## 2022-08-22 DIAGNOSIS — I10 Essential (primary) hypertension: Secondary | ICD-10-CM | POA: Diagnosis not present

## 2022-08-22 DIAGNOSIS — E782 Mixed hyperlipidemia: Secondary | ICD-10-CM | POA: Diagnosis not present

## 2022-08-22 DIAGNOSIS — E119 Type 2 diabetes mellitus without complications: Secondary | ICD-10-CM | POA: Diagnosis not present

## 2022-08-29 ENCOUNTER — Other Ambulatory Visit: Payer: Self-pay | Admitting: Family Medicine

## 2022-08-29 ENCOUNTER — Ambulatory Visit
Admission: RE | Admit: 2022-08-29 | Discharge: 2022-08-29 | Disposition: A | Discharge status: Home / Self Care | Payer: Medicare Other | Source: Ambulatory Visit | Attending: Family Medicine | Admitting: Family Medicine

## 2022-08-29 DIAGNOSIS — I10 Essential (primary) hypertension: Secondary | ICD-10-CM | POA: Diagnosis not present

## 2022-08-29 DIAGNOSIS — E1122 Type 2 diabetes mellitus with diabetic chronic kidney disease: Secondary | ICD-10-CM | POA: Diagnosis not present

## 2022-08-29 DIAGNOSIS — E782 Mixed hyperlipidemia: Secondary | ICD-10-CM | POA: Diagnosis not present

## 2022-08-29 DIAGNOSIS — M169 Osteoarthritis of hip, unspecified: Secondary | ICD-10-CM | POA: Diagnosis not present

## 2022-08-29 DIAGNOSIS — E1121 Type 2 diabetes mellitus with diabetic nephropathy: Secondary | ICD-10-CM | POA: Diagnosis not present

## 2022-08-29 DIAGNOSIS — E1165 Type 2 diabetes mellitus with hyperglycemia: Secondary | ICD-10-CM | POA: Diagnosis not present

## 2022-08-29 DIAGNOSIS — I129 Hypertensive chronic kidney disease with stage 1 through stage 4 chronic kidney disease, or unspecified chronic kidney disease: Secondary | ICD-10-CM | POA: Diagnosis not present

## 2022-08-29 DIAGNOSIS — M25551 Pain in right hip: Secondary | ICD-10-CM | POA: Diagnosis not present

## 2022-08-29 DIAGNOSIS — M1611 Unilateral primary osteoarthritis, right hip: Secondary | ICD-10-CM | POA: Diagnosis not present

## 2022-09-03 ENCOUNTER — Other Ambulatory Visit: Payer: Self-pay | Admitting: Family Medicine

## 2022-09-03 DIAGNOSIS — M169 Osteoarthritis of hip, unspecified: Secondary | ICD-10-CM

## 2022-09-24 DIAGNOSIS — H401112 Primary open-angle glaucoma, right eye, moderate stage: Secondary | ICD-10-CM | POA: Diagnosis not present

## 2022-09-24 DIAGNOSIS — H59032 Cystoid macular edema following cataract surgery, left eye: Secondary | ICD-10-CM | POA: Diagnosis not present

## 2022-09-24 DIAGNOSIS — H35371 Puckering of macula, right eye: Secondary | ICD-10-CM | POA: Diagnosis not present

## 2022-09-24 DIAGNOSIS — Z961 Presence of intraocular lens: Secondary | ICD-10-CM | POA: Diagnosis not present

## 2022-09-24 DIAGNOSIS — H04123 Dry eye syndrome of bilateral lacrimal glands: Secondary | ICD-10-CM | POA: Diagnosis not present

## 2022-10-01 ENCOUNTER — Ambulatory Visit
Admission: RE | Admit: 2022-10-01 | Discharge: 2022-10-01 | Disposition: A | Discharge status: Home / Self Care | Payer: Medicare Other | Source: Ambulatory Visit | Attending: Family Medicine | Admitting: Family Medicine

## 2022-10-01 DIAGNOSIS — M25551 Pain in right hip: Secondary | ICD-10-CM | POA: Diagnosis not present

## 2022-10-01 DIAGNOSIS — M169 Osteoarthritis of hip, unspecified: Secondary | ICD-10-CM

## 2022-10-04 ENCOUNTER — Emergency Department (HOSPITAL_BASED_OUTPATIENT_CLINIC_OR_DEPARTMENT_OTHER): Payer: Medicare Other | Admitting: Radiology

## 2022-10-04 ENCOUNTER — Other Ambulatory Visit: Payer: Self-pay

## 2022-10-04 ENCOUNTER — Emergency Department (HOSPITAL_BASED_OUTPATIENT_CLINIC_OR_DEPARTMENT_OTHER)
Admission: EM | Admit: 2022-10-04 | Discharge: 2022-10-04 | Disposition: A | Discharge status: Home / Self Care | Payer: Medicare Other | Attending: Emergency Medicine | Admitting: Emergency Medicine

## 2022-10-04 ENCOUNTER — Encounter (HOSPITAL_BASED_OUTPATIENT_CLINIC_OR_DEPARTMENT_OTHER): Payer: Self-pay | Admitting: Emergency Medicine

## 2022-10-04 ENCOUNTER — Emergency Department (HOSPITAL_BASED_OUTPATIENT_CLINIC_OR_DEPARTMENT_OTHER): Payer: Medicare Other

## 2022-10-04 DIAGNOSIS — U071 COVID-19: Secondary | ICD-10-CM | POA: Insufficient documentation

## 2022-10-04 DIAGNOSIS — J45909 Unspecified asthma, uncomplicated: Secondary | ICD-10-CM | POA: Insufficient documentation

## 2022-10-04 DIAGNOSIS — I10 Essential (primary) hypertension: Secondary | ICD-10-CM | POA: Insufficient documentation

## 2022-10-04 DIAGNOSIS — E119 Type 2 diabetes mellitus without complications: Secondary | ICD-10-CM | POA: Diagnosis not present

## 2022-10-04 DIAGNOSIS — R001 Bradycardia, unspecified: Secondary | ICD-10-CM | POA: Diagnosis not present

## 2022-10-04 DIAGNOSIS — R918 Other nonspecific abnormal finding of lung field: Secondary | ICD-10-CM | POA: Diagnosis not present

## 2022-10-04 DIAGNOSIS — Z7951 Long term (current) use of inhaled steroids: Secondary | ICD-10-CM | POA: Diagnosis not present

## 2022-10-04 DIAGNOSIS — R059 Cough, unspecified: Secondary | ICD-10-CM | POA: Diagnosis not present

## 2022-10-04 DIAGNOSIS — R0789 Other chest pain: Secondary | ICD-10-CM | POA: Diagnosis not present

## 2022-10-04 DIAGNOSIS — R079 Chest pain, unspecified: Secondary | ICD-10-CM

## 2022-10-04 DIAGNOSIS — R0602 Shortness of breath: Secondary | ICD-10-CM | POA: Insufficient documentation

## 2022-10-04 DIAGNOSIS — Z79899 Other long term (current) drug therapy: Secondary | ICD-10-CM | POA: Diagnosis not present

## 2022-10-04 DIAGNOSIS — D72829 Elevated white blood cell count, unspecified: Secondary | ICD-10-CM | POA: Diagnosis not present

## 2022-10-04 DIAGNOSIS — R9389 Abnormal findings on diagnostic imaging of other specified body structures: Secondary | ICD-10-CM

## 2022-10-04 LAB — BASIC METABOLIC PANEL
Anion gap: 12 (ref 5–15)
BUN: 20 mg/dL (ref 8–23)
CO2: 25 mmol/L (ref 22–32)
Calcium: 9.7 mg/dL (ref 8.9–10.3)
Chloride: 103 mmol/L (ref 98–111)
Creatinine, Ser: 1.07 mg/dL — ABNORMAL HIGH (ref 0.44–1.00)
GFR, Estimated: 53 mL/min — ABNORMAL LOW (ref >60)
Glucose, Bld: 166 mg/dL — ABNORMAL HIGH (ref 70–99)
Potassium: 3.7 mmol/L (ref 3.5–5.1)
Sodium: 140 mmol/L (ref 135–145)

## 2022-10-04 LAB — CBC
HCT: 40.1 % (ref 36.0–46.0)
Hemoglobin: 13 g/dL (ref 12.0–15.0)
MCH: 30.8 pg (ref 26.0–34.0)
MCHC: 32.4 g/dL (ref 30.0–36.0)
MCV: 95 fL (ref 80.0–100.0)
Platelets: 156 10*3/uL (ref 150–400)
RBC: 4.22 MIL/uL (ref 3.87–5.11)
RDW: 13.4 % (ref 11.5–15.5)
WBC: 4.7 10*3/uL (ref 4.0–10.5)
nRBC: 0 % (ref 0.0–0.2)

## 2022-10-04 LAB — TROPONIN I (HIGH SENSITIVITY)
Troponin I (High Sensitivity): 7 ng/L (ref <18)
Troponin I (High Sensitivity): 7 ng/L (ref <18)

## 2022-10-04 LAB — BRAIN NATRIURETIC PEPTIDE: B Natriuretic Peptide: 32.9 pg/mL (ref 0.0–100.0)

## 2022-10-04 MED ORDER — ALUM & MAG HYDROXIDE-SIMETH 200-200-20 MG/5ML PO SUSP
30.0000 mL | Freq: Once | ORAL | Status: AC
  Administered 2022-10-04: 30 mL via ORAL
  Filled 2022-10-04: qty 30

## 2022-10-04 MED ORDER — MOLNUPIRAVIR EUA 200MG CAPSULE: 4.0000 | ORAL_CAPSULE | Freq: Two times a day (BID) | ORAL | 0 refills | Status: AC

## 2022-10-04 MED ORDER — LIDOCAINE VISCOUS HCL 2 % MT SOLN
15.0000 mL | Freq: Once | OROMUCOSAL | Status: AC
  Administered 2022-10-04: 15 mL via ORAL
  Filled 2022-10-04: qty 15

## 2022-10-04 MED ORDER — IOHEXOL 350 MG/ML SOLN
100.0000 mL | Freq: Once | INTRAVENOUS | Status: AC | PRN
  Administered 2022-10-04: 80 mL via INTRAVENOUS

## 2022-10-04 NOTE — Discharge Instructions (Addendum)
You are seen today in the emergency department due to chest pain.  Workup today was overall reassuring.  Did have some abnormal findings on the CT angio which I copied below, please call your cardiologist tomorrow to schedule outpatient follow-up for additional evaluation.  Continue taking your medication, take the molnupiravir as prescribed for the COVID.  If you have severe ongoing chest pain, new or concerning symptoms, vomiting, severe back pain, loss of consciousness you need to return back to the ED for additional evaluation.  CT ANGIOGRAPHY CHEST WITH CONTRAST    TECHNIQUE:  Multidetector CT imaging of the chest was performed using the  standard protocol during bolus administration of intravenous  contrast. Multiplanar CT image reconstructions and MIPs were  obtained to evaluate the vascular anatomy.    RADIATION DOSE REDUCTION: This exam was performed according to the  departmental dose-optimization program which includes automated  exposure control, adjustment of the mA and/or kV according to  patient size and/or use of iterative reconstruction technique.    CONTRAST:  109m OMNIPAQUE IOHEXOL 350 MG/ML SOLN    COMPARISON:  Same day chest radiograph, CTA chest 05/21/2022    FINDINGS:  Cardiovascular: There is adequate opacification of the pulmonary  arteries to the subsegmental level. There is no evidence of  pulmonary embolism. The heart is enlarged with reflux of contrast  into the IVC/hepatic veins. There is no pericardial effusion. The  main pulmonary artery is borderline enlarged measuring up to 3.0 cm.  There is minimal calcified plaque in the aortic arch.    Mediastinum/Nodes: The thyroid is unremarkable. The esophagus is  grossly unremarkable. There is small to moderate-sized hiatal  hernia, unchanged there is no mediastinal, hilar, or axillary  lymphadenopathy.    Lungs/Pleura: The trachea and central airways are patent.    Lung volumes are low. There is interlobular  septal thickening and  mosaic attenuation in the lung bases. There is no focal  consolidation. There is no pleural effusion or pneumothorax    There are no suspicious nodules.    Upper Abdomen: The imaged portions of the upper abdominal viscera  are unremarkable.    Musculoskeletal: There is no acute osseous abnormality or suspicious  osseous lesion.    Review of the MIP images confirms the above findings.    IMPRESSION:  1. No evidence of pulmonary embolism.  2. Suspect mild pulmonary interstitial edema.  3. Borderline enlarged main pulmonary artery suggesting pulmonary  hypertension. Additionally, there is cardiomegaly with reflux of  contrast into the IVC/hepatic veins suggesting right heart  dysfunction.  4. Unchanged hiatal hernia.      Electronically Signed

## 2022-10-04 NOTE — ED Triage Notes (Signed)
Cough x 1 day. Tested positive for Covid at home. Today has tightness in chest and back pain after coughing. PCP advised to come to the ER.

## 2022-10-04 NOTE — ED Notes (Signed)
Discharge paperwork given and verbally understood. 

## 2022-10-04 NOTE — ED Provider Notes (Signed)
[facility]ERGENC[facility] DEPT Provider Note   CSN: [id number] Arrival date & time: 10/04/22  1050     History  Chief Complaint  Patient presents with   Cough   Chest Pain    [place] is a 81 y.o. female.   Cough Associated symptoms: chest pain   Chest Pain Associated symptoms: cough      Patient with medical history of hypertension, hyperlipidemia, Diabetes, anemia, asthma, chronic back pain, GERD, fibromyalgia presents to the emergency department due to chest tightness and cough.  Started last night, the cough is nonproductive.  The chest pain is substernal and constant, feels worse when she coughs.  She is also having back pain which started few days ago and is worse with movement, coughing exacerbates her back pain.  There is no pain between the shoulder blades, she has not had any loss of consciousness and does not feel lightheaded.  Afebrile at home.  Took a COVID test at home which was positive, called her primary who advised to go to ED for evaluation given the chest tightness.    Home Medications Prior to Admission medications   Medication Sig Start Date End Date Taking? Authorizing Provider  molnupiravir EUA (LAGEVRIO) 200 mg CAPS capsule Take 4 capsules (800 mg total) by mouth 2 (two) times daily for 5 days. 10/04/22 10/09/22 Yes Sherrill Raring, PA-C  acetaminophen (TYLENOL) 500 MG tablet Take 1,000 mg by mouth 2 (two) times daily as needed for headache (back and knee pain).    [provider]  albuterol (VENTOLIN HFA) 108 (90 Base) MCG/ACT inhaler Inhale 1 puff into the lungs as needed for wheezing or shortness of breath. 01/29/22   [provider]  brimonidine (ALPHAGAN) 0.15 % ophthalmic solution Place 1 drop into both eyes in the morning and at bedtime.    [provider]  brimonidine (ALPHAGAN) 0.2 % ophthalmic solution 1 drop 2 (two) times daily. 09/26/22   [provider]  cetirizine (ZYRTEC) 10 MG tablet Take 10 mg by  mouth at bedtime.    [provider]  Cholecalciferol (VITAMIN D3 PO) Take 3 capsules by mouth daily in the afternoon.    [provider]  Cholecalciferol (VITAMIN D3) 250 MCG (10000 UT) capsule Take 10,000 Units by mouth daily.    [provider]  dexlansoprazole (DEXILANT) 60 MG capsule Take 1 capsule (60 mg total) by mouth daily. 11/13/21   Esterwood, Amy S, PA-C  hydrochlorothiazide (HYDRODIURIL) 25 MG tablet Take 25 mg by mouth every morning. 06/18/22   [provider]  losartan (COZAAR) 25 MG tablet Take 25 mg by mouth 2 (two) times daily. 08/29/22   [provider]  losartan (COZAAR) 50 MG tablet Take 50 mg by mouth daily.    [provider]  montelukast (SINGULAIR) 10 MG tablet Take 10 mg by mouth at bedtime.    [provider]  nystatin (MYCOSTATIN/NYSTOP) powder Apply 1 Application topically daily as needed (for rash). 11/20/16   [provider]  nystatin ointment (MYCOSTATIN) Apply 1 application  topically See admin instructions. 1 application 1-2 times a day as needed for rash 11/20/16   [provider]  Polyethyl Glycol-Propyl Glycol (SYSTANE OP) Place 1 drop into both eyes daily as needed (dry/irritated eyes).    [provider]  rosuvastatin (CRESTOR) 40 MG tablet Take 40 mg by mouth at bedtime.    [provider]  sertraline (ZOLOFT) 100 MG tablet Take 100 mg by mouth every morning. 03/29/22  [provider]  sucralfate (CARAFATE) 1 g tablet Take 1 tablet (1 g total) by mouth 4 (four) times daily -  with meals and at bedtime. Patient taking differently: Take 1 g by mouth at bedtime. 11/13/21   Esterwood, Amy S, PA-C      Allergies    Adalat [nifedipine], Hydrocodone, Meperidine hcl, and Naproxen    Review of Systems   Review of Systems  Respiratory:  Positive for cough.   Cardiovascular:  Positive for chest pain.    Physical Exam Updated Vital Signs BP (!) 160/116   Pulse  (!) 57   Temp 98.2 F (36.8 C) (Oral)   Resp 14   Ht '5\' 10"'$  (1.778 m)   Wt 95.3 kg   SpO2 93%   BMI 30.13 kg/m  Physical Exam Vitals and nursing note reviewed. Exam conducted with a chaperone present.  Constitutional:      Appearance: Normal appearance.  HENT:     Head: Normocephalic and atraumatic.  Eyes:     General: No scleral icterus.       Right eye: No discharge.        Left eye: No discharge.     Extraocular Movements: Extraocular movements intact.     Pupils: Pupils are equal, round, and reactive to light.  Cardiovascular:     Rate and Rhythm: Regular rhythm. Bradycardia present.     Pulses: Normal pulses.     Heart sounds: Normal heart sounds.     No friction rub. No gallop.     Comments: Upper and lower extremity pulses are symmetric bilaterally. Pulmonary:     Effort: Pulmonary effort is normal. No respiratory distress.     Breath sounds: Normal breath sounds.     Comments: Speaking in complete sentences Abdominal:     General: Abdomen is flat. Bowel sounds are normal. There is no distension.     Palpations: Abdomen is soft.     Tenderness: There is no abdominal tenderness.  Musculoskeletal:        General: No swelling or tenderness.  Skin:    General: Skin is warm and dry.     Coloration: Skin is not jaundiced.  Neurological:     Mental Status: She is alert. Mental status is at baseline.     Coordination: Coordination normal.     ED Results / Procedures / Treatments   Labs (all labs ordered are listed, but only abnormal results are displayed) Labs Reviewed  BASIC METABOLIC PANEL - Abnormal; Notable for the following components:      Result Value   Glucose, Bld 166 (*)    Creatinine, Ser 1.07 (*)    GFR, Estimated 53 (*)    All other components within normal limits  CBC  BRAIN NATRIURETIC PEPTIDE  TROPONIN I (HIGH SENSITIVITY)  TROPONIN I (HIGH SENSITIVITY)    EKG EKG Interpretation  Date/Time:  Thursday October 04 2022 11:01:01  EST Ventricular Rate:  73 PR Interval:  166 QRS Duration: 92 QT Interval:  382 QTC Calculation: 420 R Axis:   -14 Text Interpretation: Normal sinus rhythm Moderate voltage criteria for LVH, may be normal variant ( R in aVL , Cornell product ) Borderline ECG When compared with ECG of 21-May-2022 16:11, T wave inversion now evident in Inferior leads T wave inversion was present on EKG in October 26 2021 Confirmed by Malvin Johns [phone]) on 10/04/2022 1:40:36 PM  Radiology CT Angio Chest PE W/Cm &/Or Wo Cm  Result Date: 10/04/2022 CLINICAL DATA:  Chest tightness and shortness of breath for 1 day EXAM: CT ANGIOGRAPHY CHEST WITH CONTRAST TECHNIQUE: Multidetector CT imaging of the chest was performed using the standard protocol during bolus administration of intravenous contrast. Multiplanar CT image reconstructions and MIPs were obtained to evaluate the vascular anatomy. RADIATION DOSE REDUCTION: This exam was performed according to the departmental dose-optimization program which includes automated exposure control, adjustment of the mA and/or kV according to patient size and/or use of iterative reconstruction technique. CONTRAST:  40m OMNIPAQUE IOHEXOL 350 MG/ML SOLN COMPARISON:  Same day chest radiograph, CTA chest 05/21/2022 FINDINGS: Cardiovascular: There is adequate opacification of the pulmonary arteries to the subsegmental level. There is no evidence of pulmonary embolism. The heart is enlarged with reflux of contrast into the IVC/hepatic veins. There is no pericardial effusion. The main pulmonary artery is borderline enlarged measuring up to 3.0 cm. There is minimal calcified plaque in the aortic arch. Mediastinum/Nodes: The thyroid is unremarkable. The esophagus is grossly unremarkable. There is small to moderate-sized hiatal hernia, unchanged there is no mediastinal, hilar, or axillary lymphadenopathy. Lungs/Pleura: The trachea and central airways are patent. Lung volumes are low. There is  interlobular septal thickening and mosaic attenuation in the lung bases. There is no focal consolidation. There is no pleural effusion or pneumothorax There are no suspicious nodules. Upper Abdomen: The imaged portions of the upper abdominal viscera are unremarkable. Musculoskeletal: There is no acute osseous abnormality or suspicious osseous lesion. Review of the MIP images confirms the above findings. IMPRESSION: 1. No evidence of pulmonary embolism. 2. Suspect mild pulmonary interstitial edema. 3. Borderline enlarged main pulmonary artery suggesting pulmonary hypertension. Additionally, there is cardiomegaly with reflux of contrast into the IVC/hepatic veins suggesting right heart dysfunction. 4. Unchanged hiatal hernia. Electronically Signed   By: PValetta MoleM.D.   On: 10/04/2022 14:03   DG Chest 2 View  Result Date: 10/04/2022 CLINICAL DATA:  Chest pain, cough EXAM: CHEST - 2 VIEW COMPARISON:  05/21/2022 FINDINGS: The heart size and mediastinal contours are within normal limits. Both lungs are clear. Dextroscoliosis is seen in thoracic spine. IMPRESSION: No active cardiopulmonary disease. Electronically Signed   By: PElmer PickerM.D.   On: 10/04/2022 11:42    Procedures Procedures    Medications Ordered in ED Medications  alum & mag hydroxide-simeth (MAALOX/MYLANTA) 200-200-20 MG/5ML suspension 30 mL (30 mLs Oral Given 10/04/22 1252)    And  lidocaine (XYLOCAINE) 2 % viscous mouth solution 15 mL (15 mLs Oral Given 10/04/22 1252)  iohexol (OMNIPAQUE) 350 MG/ML injection 100 mL (80 mLs Intravenous Contrast Given 10/04/22 1346)    ED Course/ Medical Decision Making/ A&P                           Medical Decision Making Amount and/or Complexity of Data Reviewed Labs: ordered. Radiology: ordered.  Risk OTC drugs. Prescription drug management.   Patient presents due to chest pain.  Differential includes but not limited to ACS, PE, pneumonia, dissection, AAA, Boerhaave's  esophagus, COVID.  I viewed external medical records including previous cardiology visit and cardiac workups. -Last echo 11/17/21 - Heart squeeze normal with ejection fraction 60 to 65%.  Mild left ventricular hypertrophy.  Grade 1 diastolic dysfunction.  Normal right ventricular systolic function.  No significant valve disease.   I ordered, viewed and interpreted laboratory workup. -CBC with a leukocytosis or anemia. -BMP without gross electrolyte derangement, renal function is baseline. -Troponin negative delta 7-7 -BNP WNL  Patient  is on cardiac monitoring in sinus bradycardia per my interpretation.  EKG actually has some T wave inversions that per chart review have been present previously.  No reciprocal ST elevation.  Chest x-ray ordered and viewed by myself.  Normal cardiac silhouette without cardiomegaly or widened mediastinum.  I agree with radiologist interpretation.  I suspect the chest pain could likely be secondary to COVID infection.  Will definitely need a second troponin given T wave inversions in inferior leads and cardiac risk factors.  Additionally having pain is pleuritic with radiation to the back PE is a consideration, we will proceed with CTA PE study.  CTA PE study -no PE, mild pulmonary interstitial edema.   I discussed the results with the patient she will need close outpatient cardiology follow-up.  Discussed results with my attending who reviewed the imaging as well and does not think patient needs admission or emergent cardiology consult and I agree.  Given negative delta troponin changes on EKG I do not think this is ACS.  No signs of new onset of heart failure, she also had a recent echo in February. BNP is within normal limits so I do not think she has a new onset of right heart failure.  Patient is on cardiac monitoring stable in sinus rhythm without any hypoxia.  Chest pain improved after the GI cocktail on reevaluation.  Will start on antiviral medicine for  COVID given she had a home test yesterday which was positive.  Strict return precautions were discussed with the patient who verbalized understanding and agreement to the plan.  Stable for close outpatient follow-up with cardiology.  Discussed HPI, physical exam and plan of care for this patient with attending Malvin Johns. The attending physician evaluated this patient as part of a shared visit and agrees with plan of care.         Final Clinical Impression(s) / ED Diagnoses Final diagnoses:  [id number]  Chest pain, unspecified type  Abnormal CT of the chest    Rx / DC Orders ED Discharge Orders          Ordered    molnupiravir EUA (LAGEVRIO) 200 mg CAPS capsule  2 times daily        10/04/22 1504              Sherrill Raring, PA-C 10/04/22 2114    Malvin Johns, MD 10/05/22 [phone]

## 2022-10-09 ENCOUNTER — Telehealth: Payer: Self-pay | Admitting: Cardiovascular Disease

## 2022-10-09 NOTE — Telephone Encounter (Signed)
Patient is calling requesting a provider switch from Dr. Gwenlyn Found to Dr. Harriet Masson due to hearing really good things about her.

## 2022-10-11 ENCOUNTER — Ambulatory Visit: Payer: Medicare Other | Attending: Cardiology | Admitting: Cardiology

## 2022-10-11 ENCOUNTER — Encounter: Payer: Self-pay | Admitting: Cardiology

## 2022-10-11 VITALS — BP 140/82 | HR 60 | Ht 70.0 in | Wt 211.4 lb

## 2022-10-11 DIAGNOSIS — R221 Localized swelling, mass and lump, neck: Secondary | ICD-10-CM

## 2022-10-11 DIAGNOSIS — I10 Essential (primary) hypertension: Secondary | ICD-10-CM | POA: Diagnosis not present

## 2022-10-11 DIAGNOSIS — R0602 Shortness of breath: Secondary | ICD-10-CM | POA: Diagnosis not present

## 2022-10-11 DIAGNOSIS — E782 Mixed hyperlipidemia: Secondary | ICD-10-CM | POA: Diagnosis not present

## 2022-10-11 DIAGNOSIS — Z79899 Other long term (current) drug therapy: Secondary | ICD-10-CM | POA: Diagnosis not present

## 2022-10-11 DIAGNOSIS — R0609 Other forms of dyspnea: Secondary | ICD-10-CM | POA: Diagnosis not present

## 2022-10-11 MED ORDER — FUROSEMIDE 40 MG PO TABS: 40.0000 mg | ORAL_TABLET | Freq: Every day | ORAL | 0 refills | Status: DC

## 2022-10-11 MED ORDER — POTASSIUM CHLORIDE CRYS ER 20 MEQ PO TBCR: 20.0000 meq | EXTENDED_RELEASE_TABLET | Freq: Every day | ORAL | 0 refills | Status: DC

## 2022-10-11 NOTE — Progress Notes (Unsigned)
Cardiology Office Note:    Date:  10/11/2022   ID:  [name], DOB 20-Apr-1941, MRN [id number]  PCP:  Fanny Bien, MD  Cardiologist:  Quay Burow, MD  Electrophysiologist:  None   Referring MD: Fanny Bien, MD     History of Present Illness:    [name] is a 81 y.o. female with a hx of hypertension, occasional PVC  Past Medical History:  Diagnosis Date   Allergy    Anal or rectal pain    sometimes   Anemia    hx of during pregnancy   Anxiety    Arthritis    Asthma    hx of   Bradycardia    " I KNOW I HAVE BRADYCARDIA ESPECIALLY WHEN I SLEEP"    Cataract 2021   bilateral eyes   Chronic back pain    Degenerative joint disease    osteo   Depression    Diabetes mellitus without complication ([place])    DM type II   Diverticulosis 2003   Dysrhythmia    hx of  due to eye drop and also low heart rate 40's per pt. Dr. Gwenlyn Found follows   Elevated total protein    Esophageal dysmotility    Fibromyalgia    GERD (gastroesophageal reflux disease)    subsequent Nissen Fundoplication   Glaucoma    Hearing loss    Heart murmur    "was told she had a heart murmur"   Hemorrhoids    Hiatal hernia 11/08/09   Hx of adenomatous colonic polyps 07/02/02   Hypercalcemia    Hyperlipidemia    Hyperlipidemia    Hypertension    Nausea    Osteoporosis    PONV (postoperative nausea and vomiting)    Rectal bleeding    from hemorrhoids.     Sleep apnea    DOES USE CPAP    Thrombocytopenia (HCC)    Varicose veins of left lower extremity     Past Surgical History:  Procedure Laterality Date   CARDIAC CATHETERIZATION  03/14/1992   Normal cardiac cath. Normal LV function.   CARDIOVASCULAR STRESS TEST  01/22/2011   No scintigraphic evidence of inducible ischemia.   CAROTID DOPPLER  03/31/2007   Bilateral ICAs - no evidence of significant diameter reduction, dissectin, tortuosity, FMD, or any other vascular abnormality.   CATARACT EXTRACTION, BILATERAL     ESOPHAGEAL  MANOMETRY N/A 11/14/2015   Procedure: ESOPHAGEAL MANOMETRY (EM);  Surgeon: Mauri Pole, MD;  Location: [facility];  Service: Endoscopy;  Laterality: N/A;   ESOPHAGEAL MANOMETRY N/A 01/18/2021   Procedure: ESOPHAGEAL MANOMETRY (EM);  Surgeon: Mauri Pole, MD;  Location: [facility];  Service: Endoscopy;  Laterality: N/A;   ESOPHAGOGASTRODUODENOSCOPY N/A 03/08/2021   Procedure: ESOPHAGOGASTRODUODENOSCOPY (EGD);  Surgeon: Lajuana Matte, MD;  Location: [place];  Service: Thoracic;  Laterality: N/A;   EYE SURGERY     bilateral cataracts; bilateral stents   GASTRIC RESECTION  2009   GLAUCOMA SURGERY     JOINT REPLACEMENT     right knee Dr. Wynelle Link 06-23-18   KNEE SURGERY Bilateral    NISSEN FUNDOPLICATION  7408   with subsequent takedown in 2009   [place] Left 06/10/2017   Procedure: LEFT  TOTAL KNEE ARTHROPLASTY;  Surgeon: Gaynelle Arabian, MD;  Location: [facility];  Service: Orthopedics;  Laterality: Left;  Adductor Block   TOTAL KNEE ARTHROPLASTY Right 06/23/2018   Procedure: RIGHT TOTAL KNEE ARTHROPLASTY;  Surgeon: Gaynelle Arabian, MD;  Location: [facility];  Service: Orthopedics;  Laterality: Right;   TRANSTHORACIC ECHOCARDIOGRAM  12/21/2010   EF 60%, moderate LVH,    TUBAL LIGATION     XI ROBOTIC ASSISTED HIATAL HERNIA REPAIR N/A 03/08/2021   Procedure: XI ROBOTIC ASSISTED LAPAROSCOPY WITH LYSIS OF ADHESIONS;  Surgeon: Lajuana Matte, MD;  Location: [facility];  Service: Thoracic;  Laterality: N/A;    Current Medications: Current Meds  Medication Sig   acetaminophen (TYLENOL) 500 MG tablet Take 1,000 mg by mouth 2 (two) times daily as needed for headache (back and knee pain).   albuterol (VENTOLIN HFA) 108 (90 Base) MCG/ACT inhaler Inhale 1 puff into the lungs as needed for wheezing or shortness of breath.   brimonidine (ALPHAGAN) 0.15 % ophthalmic solution Place 1 drop into both eyes in the morning and at bedtime.   brimonidine (ALPHAGAN) 0.2 % ophthalmic solution 1  drop 2 (two) times daily.   cetirizine (ZYRTEC) 10 MG tablet Take 10 mg by mouth at bedtime.   Cholecalciferol (VITAMIN D3 PO) Take 3 capsules by mouth daily in the afternoon.   Cholecalciferol (VITAMIN D3) 250 MCG (10000 UT) capsule Take 10,000 Units by mouth daily.   dexlansoprazole (DEXILANT) 60 MG capsule Take 1 capsule (60 mg total) by mouth daily.   hydrochlorothiazide (HYDRODIURIL) 25 MG tablet Take 25 mg by mouth every morning.   losartan (COZAAR) 25 MG tablet Take 25 mg by mouth 2 (two) times daily.   losartan (COZAAR) 50 MG tablet Take 50 mg by mouth daily.   montelukast (SINGULAIR) 10 MG tablet Take 10 mg by mouth at bedtime.   nystatin (MYCOSTATIN/NYSTOP) powder Apply 1 Application topically daily as needed (for rash).   nystatin ointment (MYCOSTATIN) Apply 1 application  topically See admin instructions. 1 application 1-2 times a day as needed for rash   Polyethyl Glycol-Propyl Glycol (SYSTANE OP) Place 1 drop into both eyes daily as needed (dry/irritated eyes).   rosuvastatin (CRESTOR) 40 MG tablet Take 40 mg by mouth at bedtime.   sertraline (ZOLOFT) 100 MG tablet Take 100 mg by mouth every morning.   sucralfate (CARAFATE) 1 g tablet Take 1 tablet (1 g total) by mouth 4 (four) times daily -  with meals and at bedtime. (Patient taking differently: Take 1 g by mouth at bedtime.)     Allergies:   Adalat [nifedipine], Hydrocodone, Meperidine hcl, and Naproxen   Social History   Socioeconomic History   Marital status: Married    Spouse name: Not on file   Number of children: 8   Years of education: Not on file   Highest education level: Not on file  Occupational History   Occupation: retired    Comment: retired Therapist, art.   Tobacco Use   Smoking status: Never   Smokeless tobacco: Never  Vaping Use   Vaping Use: Never used  Substance and Sexual Activity   Alcohol use: No    Alcohol/week: 0.0 standard drinks of alcohol   Drug use: No   Sexual activity: Yes   Other Topics Concern   Not on file  Social History Narrative   Husband, Zareena Willis is Next of Kin. Cell # [phone]   Social Determinants of Health   Financial Resource Strain: Not on file  Food Insecurity: Not on file  Transportation Needs: Not on file  Physical Activity: Not on file  Stress: Not on file  Social Connections: Not on file     Family History: The patient's family history includes Breast cancer  in her daughter; Cancer in her maternal grandmother; Diabetes in her father and mother; Heart disease in her mother; Hypertension in her mother; Kidney disease in her father and maternal grandfather; Leukemia in her maternal uncle; Prostate cancer in her maternal uncle; Stomach cancer in her maternal aunt; Stroke in her brother; Uterine cancer in her maternal aunt. There is no history of Colon cancer, Rectal cancer, or Esophageal cancer.  ROS:   Review of Systems  Constitution: Negative for decreased appetite, fever and weight gain.  HENT: Negative for congestion, ear discharge, hoarse voice and sore throat.   Eyes: Negative for discharge, redness, vision loss in right eye and visual halos.  Cardiovascular: Negative for chest pain, dyspnea on exertion, leg swelling, orthopnea and palpitations.  Respiratory: Negative for cough, hemoptysis, shortness of breath and snoring.   Endocrine: Negative for heat intolerance and polyphagia.  Hematologic/Lymphatic: Negative for bleeding problem. Does not bruise/bleed easily.  Skin: Negative for flushing, nail changes, rash and suspicious lesions.  Musculoskeletal: Negative for arthritis, joint pain, muscle cramps, myalgias, neck pain and stiffness.  Gastrointestinal: Negative for abdominal pain, bowel incontinence, diarrhea and excessive appetite.  Genitourinary: Negative for decreased libido, genital sores and incomplete emptying.  Neurological: Negative for brief paralysis, focal weakness, headaches and loss of balance.   Psychiatric/Behavioral: Negative for altered mental status, depression and suicidal ideas.  Allergic/Immunologic: Negative for HIV exposure and persistent infections.    EKGs/Labs/Other Studies Reviewed:    The following studies were reviewed today:   EKG:  The ekg ordered today demonstrates   Recent Labs: 05/22/2022: ALT 12; Magnesium 2.2; TSH 0.880 10/04/2022: B Natriuretic Peptide 32.9; BUN 20; Creatinine, Ser 1.07; Hemoglobin 13.0; Platelets 156; Potassium 3.7; Sodium 140  Recent Lipid Panel    Component Value Date/Time   CHOL 200 (H) 10/26/2020 0942   TRIG 46 10/26/2020 0942   HDL 76 10/26/2020 0942   CHOLHDL 2.6 10/26/2020 0942   CHOLHDL 3.2 07/24/2020 2044   VLDL 13 07/24/2020 2044   LDLCALC 115 (H) 10/26/2020 0942    Physical Exam:    VS:  There were no vitals taken for this visit.    Wt Readings from Last 3 Encounters:  10/04/22 210 lb (95.3 kg)  07/04/22 212 lb (96.2 kg)  05/24/22 206 lb 12.8 oz (93.8 kg)     GEN: Well nourished, well developed in no acute distress HEENT: Normal NECK: No JVD; No carotid bruits LYMPHATICS: No lymphadenopathy CARDIAC: S1S2 noted,RRR, no murmurs, rubs, gallops RESPIRATORY:  Clear to auscultation without rales, wheezing or rhonchi  ABDOMEN: Soft, non-tender, non-distended, +bowel sounds, no guarding. EXTREMITIES: No edema, No cyanosis, no clubbing MUSCULOSKELETAL:  No deformity  SKIN: Warm and dry NEUROLOGIC:  Alert and oriented x 3, non-focal PSYCHIATRIC:  Normal affect, good insight  ASSESSMENT:    No diagnosis found. PLAN:     1.  The patient is in agreement with the above plan. The patient left the office in stable condition.  The patient will follow up in   Medication Adjustments/Labs and Tests Ordered: Current medicines are reviewed at length with the patient today.  Concerns regarding medicines are outlined above.  No orders of the defined types were placed in this encounter.  No orders of the defined types  were placed in this encounter.   There are no Patient Instructions on file for this visit.   Adopting a Healthy Lifestyle.  Know what a healthy weight is for you (roughly BMI <25) and aim to maintain this   Aim  for 7+ servings of fruits and vegetables daily   65-80+ fluid ounces of water or unsweet tea for healthy kidneys   Limit to max 1 drink of alcohol per day; avoid smoking/tobacco   Limit animal fats in diet for cholesterol and heart health - choose grass fed whenever available   Avoid highly processed foods, and foods high in saturated/trans fats   Aim for low stress - take time to unwind and care for your mental health   Aim for 150 min of moderate intensity exercise weekly for heart health, and weights twice weekly for bone health   Aim for 7-9 hours of sleep daily   When it comes to diets, agreement about the perfect plan isnt easy to find, even among the experts. Experts at the [employer] developed an idea known as the Healthy Eating Plate. Just imagine a plate divided into logical, healthy portions.   The emphasis is on diet quality:   Load up on vegetables and fruits - one-half of your plate: Aim for color and variety, and remember that potatoes dont count.   Go for whole grains - one-quarter of your plate: Whole wheat, barley, wheat berries, quinoa, oats, brown rice, and foods made with them. If you want pasta, go with whole wheat pasta.   Protein power - one-quarter of your plate: Fish, chicken, beans, and nuts are all healthy, versatile protein sources. Limit red meat.   The diet, however, does go beyond the plate, offering a few other suggestions.   Use healthy plant oils, such as olive, canola, soy, corn, sunflower and peanut. Check the labels, and avoid partially hydrogenated oil, which have unhealthy trans fats.   If youre thirsty, drink water. Coffee and tea are good in moderation, but skip sugary drinks and limit milk and dairy  products to one or two daily servings.   The type of carbohydrate in the diet is more important than the amount. Some sources of carbohydrates, such as vegetables, fruits, whole grains, and beans-are healthier than others.   Finally, stay active  Signed, Berniece Salines, DO  10/11/2022 8:55 AM    [place]

## 2022-10-11 NOTE — Patient Instructions (Signed)
Medication Instructions:  Your physician has recommended you make the following change in your medication:  START: Lasix '40mg'$  for the next 5 days.  START: Potassium (KCL) 20 meq's for the next 5 days  *If you need a refill on your cardiac medications before your next appointment, please call your pharmacy*  Please purchase an abdominal binder and medical grade compression stockings.   Please speak with your PCP or Pulmonologist about getting another sleep study done to reset your EPAP     Lab Work: Your physician recommends that you have the following labs drawn today: CMET, Magnesium, and BNP If you have labs (blood work) drawn today and your tests are completely normal, you will receive your results only by: MyChart Message (if you have MyChart) OR A paper copy in the mail If you have any lab test that is abnormal or we need to change your treatment, we will call you to review the results.   Testing/Procedures: Your physician has requested that you have an echocardiogram. Echocardiography is a painless test that uses sound waves to create images of your heart. It provides your doctor with information about the size and shape of your heart and how well your heart's chambers and valves are working. This procedure takes approximately one hour. There are no restrictions for this procedure. Please do NOT wear cologne, perfume, aftershave, or lotions (deodorant is allowed). Please arrive 15 minutes prior to your appointment time.  Your physician has requested that you have an ultrasound of your neck.     Follow-Up: At [facility], you and your health needs are our priority.  As part of our continuing mission to provide you with exceptional heart care, we have created designated Provider Care Teams.  These Care Teams include your primary Cardiologist (physician) and Advanced Practice Providers (APPs -  Physician Assistants and Nurse Practitioners) who all work together to provide  you with the care you need, when you need it.  We recommend signing up for the patient portal called "MyChart".  Sign up information is provided on this After Visit Summary.  MyChart is used to connect with patients for Virtual Visits (Telemedicine).  Patients are able to view lab/test results, encounter notes, upcoming appointments, etc.  Non-urgent messages can be sent to your provider as well.   To learn more about what you can do with MyChart, go to [email or web address].    Your next appointment:   6 week(s)  The format for your next appointment:   In Person  Provider:   Berniece Salines, DO

## 2022-10-12 DIAGNOSIS — H59032 Cystoid macular edema following cataract surgery, left eye: Secondary | ICD-10-CM | POA: Diagnosis not present

## 2022-10-12 DIAGNOSIS — E113291 Type 2 diabetes mellitus with mild nonproliferative diabetic retinopathy without macular edema, right eye: Secondary | ICD-10-CM | POA: Diagnosis not present

## 2022-10-12 DIAGNOSIS — H401133 Primary open-angle glaucoma, bilateral, severe stage: Secondary | ICD-10-CM | POA: Diagnosis not present

## 2022-10-12 DIAGNOSIS — E113212 Type 2 diabetes mellitus with mild nonproliferative diabetic retinopathy with macular edema, left eye: Secondary | ICD-10-CM | POA: Diagnosis not present

## 2022-10-12 LAB — COMPREHENSIVE METABOLIC PANEL
ALT: 9 IU/L (ref 0–32)
AST: 22 IU/L (ref 0–40)
Albumin/Globulin Ratio: 1.7 (ref 1.2–2.2)
Albumin: 4.4 g/dL (ref 3.8–4.8)
Alkaline Phosphatase: 93 IU/L (ref 44–121)
BUN/Creatinine Ratio: 21 (ref 12–28)
BUN: 20 mg/dL (ref 8–27)
Bilirubin Total: 0.4 mg/dL (ref 0.0–1.2)
CO2: 26 mmol/L (ref 20–29)
Calcium: 9.7 mg/dL (ref 8.7–10.3)
Chloride: 103 mmol/L (ref 96–106)
Creatinine, Ser: 0.94 mg/dL (ref 0.57–1.00)
Globulin, Total: 2.6 g/dL (ref 1.5–4.5)
Glucose: 101 mg/dL — ABNORMAL HIGH (ref 70–99)
Potassium: 4.6 mmol/L (ref 3.5–5.2)
Sodium: 140 mmol/L (ref 134–144)
Total Protein: 7 g/dL (ref 6.0–8.5)
eGFR: 61 mL/min/{1.73_m2} (ref >59)

## 2022-10-12 LAB — MAGNESIUM: Magnesium: 2.3 mg/dL (ref 1.6–2.3)

## 2022-10-12 LAB — PRO B NATRIURETIC PEPTIDE: NT-Pro BNP: 181 pg/mL (ref 0–738)

## 2022-10-21 ENCOUNTER — Observation Stay (HOSPITAL_COMMUNITY)
Admission: EM | Admit: 2022-10-21 | Discharge: 2022-10-22 | Disposition: A | Discharge status: Home / Self Care | Payer: Medicare Other | Attending: Internal Medicine | Admitting: Internal Medicine

## 2022-10-21 ENCOUNTER — Emergency Department (HOSPITAL_COMMUNITY): Payer: Medicare Other

## 2022-10-21 ENCOUNTER — Encounter (HOSPITAL_COMMUNITY): Payer: Self-pay | Admitting: *Deleted

## 2022-10-21 ENCOUNTER — Other Ambulatory Visit: Payer: Self-pay

## 2022-10-21 DIAGNOSIS — R110 Nausea: Secondary | ICD-10-CM | POA: Diagnosis not present

## 2022-10-21 DIAGNOSIS — I10 Essential (primary) hypertension: Secondary | ICD-10-CM | POA: Insufficient documentation

## 2022-10-21 DIAGNOSIS — R55 Syncope and collapse: Principal | ICD-10-CM | POA: Diagnosis present

## 2022-10-21 DIAGNOSIS — R42 Dizziness and giddiness: Secondary | ICD-10-CM | POA: Diagnosis not present

## 2022-10-21 DIAGNOSIS — J45909 Unspecified asthma, uncomplicated: Secondary | ICD-10-CM | POA: Insufficient documentation

## 2022-10-21 DIAGNOSIS — U071 COVID-19: Secondary | ICD-10-CM | POA: Diagnosis not present

## 2022-10-21 DIAGNOSIS — R0600 Dyspnea, unspecified: Secondary | ICD-10-CM | POA: Diagnosis not present

## 2022-10-21 DIAGNOSIS — Z79899 Other long term (current) drug therapy: Secondary | ICD-10-CM | POA: Insufficient documentation

## 2022-10-21 DIAGNOSIS — I951 Orthostatic hypotension: Secondary | ICD-10-CM | POA: Insufficient documentation

## 2022-10-21 DIAGNOSIS — R531 Weakness: Secondary | ICD-10-CM | POA: Diagnosis not present

## 2022-10-21 DIAGNOSIS — R0602 Shortness of breath: Secondary | ICD-10-CM | POA: Diagnosis not present

## 2022-10-21 DIAGNOSIS — I959 Hypotension, unspecified: Secondary | ICD-10-CM | POA: Diagnosis not present

## 2022-10-21 LAB — BASIC METABOLIC PANEL
Anion gap: 7 (ref 5–15)
BUN: 17 mg/dL (ref 8–23)
CO2: 26 mmol/L (ref 22–32)
Calcium: 8.4 mg/dL — ABNORMAL LOW (ref 8.9–10.3)
Chloride: 102 mmol/L (ref 98–111)
Creatinine, Ser: 1.22 mg/dL — ABNORMAL HIGH (ref 0.44–1.00)
GFR, Estimated: 45 mL/min — ABNORMAL LOW (ref >60)
Glucose, Bld: 119 mg/dL — ABNORMAL HIGH (ref 70–99)
Potassium: 3.9 mmol/L (ref 3.5–5.1)
Sodium: 135 mmol/L (ref 135–145)

## 2022-10-21 LAB — CBC WITH DIFFERENTIAL/PLATELET
Abs Immature Granulocytes: 0.01 10*3/uL (ref 0.00–0.07)
Basophils Absolute: 0 10*3/uL (ref 0.0–0.1)
Basophils Relative: 0 %
Eosinophils Absolute: 0.1 10*3/uL (ref 0.0–0.5)
Eosinophils Relative: 1 %
HCT: 40 % (ref 36.0–46.0)
Hemoglobin: 13.1 g/dL (ref 12.0–15.0)
Immature Granulocytes: 0 %
Lymphocytes Relative: 24 %
Lymphs Abs: 1.3 10*3/uL (ref 0.7–4.0)
MCH: 31.6 pg (ref 26.0–34.0)
MCHC: 32.8 g/dL (ref 30.0–36.0)
MCV: 96.6 fL (ref 80.0–100.0)
Monocytes Absolute: 0.4 10*3/uL (ref 0.1–1.0)
Monocytes Relative: 8 %
Neutro Abs: 3.6 10*3/uL (ref 1.7–7.7)
Neutrophils Relative %: 67 %
Platelets: 151 10*3/uL (ref 150–400)
RBC: 4.14 MIL/uL (ref 3.87–5.11)
RDW: 13.3 % (ref 11.5–15.5)
WBC: 5.3 10*3/uL (ref 4.0–10.5)
nRBC: 0 % (ref 0.0–0.2)

## 2022-10-21 LAB — BRAIN NATRIURETIC PEPTIDE: B Natriuretic Peptide: 74.6 pg/mL (ref 0.0–100.0)

## 2022-10-21 MED ORDER — SODIUM CHLORIDE 0.9 % IV BOLUS
500.0000 mL | Freq: Once | INTRAVENOUS | Status: AC
  Administered 2022-10-21: 500 mL via INTRAVENOUS

## 2022-10-21 NOTE — ED Notes (Signed)
Pt placed on purewick 

## 2022-10-21 NOTE — ED Triage Notes (Signed)
Pt here via [facility] for syncope x 3 at home today.  PT fell into her bed each time.  States decreased intake x 2 days and fever of 101  and headache this am that improved with 1000 mg tylenol.

## 2022-10-22 ENCOUNTER — Inpatient Hospital Stay (HOSPITAL_COMMUNITY): Payer: Medicare Other

## 2022-10-22 ENCOUNTER — Inpatient Hospital Stay (HOSPITAL_BASED_OUTPATIENT_CLINIC_OR_DEPARTMENT_OTHER): Payer: Medicare Other

## 2022-10-22 DIAGNOSIS — E079 Disorder of thyroid, unspecified: Secondary | ICD-10-CM | POA: Diagnosis not present

## 2022-10-22 DIAGNOSIS — R221 Localized swelling, mass and lump, neck: Secondary | ICD-10-CM | POA: Diagnosis not present

## 2022-10-22 DIAGNOSIS — J9811 Atelectasis: Secondary | ICD-10-CM | POA: Diagnosis not present

## 2022-10-22 DIAGNOSIS — R55 Syncope and collapse: Secondary | ICD-10-CM

## 2022-10-22 DIAGNOSIS — U071 COVID-19: Secondary | ICD-10-CM | POA: Diagnosis not present

## 2022-10-22 DIAGNOSIS — K449 Diaphragmatic hernia without obstruction or gangrene: Secondary | ICD-10-CM | POA: Diagnosis not present

## 2022-10-22 LAB — COMPREHENSIVE METABOLIC PANEL
ALT: 10 U/L (ref 0–44)
AST: 18 U/L (ref 15–41)
Albumin: 2.9 g/dL — ABNORMAL LOW (ref 3.5–5.0)
Alkaline Phosphatase: 61 U/L (ref 38–126)
Anion gap: 6 (ref 5–15)
BUN: 13 mg/dL (ref 8–23)
CO2: 25 mmol/L (ref 22–32)
Calcium: 8.1 mg/dL — ABNORMAL LOW (ref 8.9–10.3)
Chloride: 103 mmol/L (ref 98–111)
Creatinine, Ser: 0.99 mg/dL (ref 0.44–1.00)
GFR, Estimated: 58 mL/min — ABNORMAL LOW (ref >60)
Glucose, Bld: 107 mg/dL — ABNORMAL HIGH (ref 70–99)
Potassium: 3.7 mmol/L (ref 3.5–5.1)
Sodium: 134 mmol/L — ABNORMAL LOW (ref 135–145)
Total Bilirubin: 0.4 mg/dL (ref 0.3–1.2)
Total Protein: 6.2 g/dL — ABNORMAL LOW (ref 6.5–8.1)

## 2022-10-22 LAB — ECHOCARDIOGRAM COMPLETE
Area-P 1/2: 3.03 cm2
Calc EF: 55.9 %
Height: 70 in
S' Lateral: 2.5 cm
Single Plane A2C EF: 63.9 %
Single Plane A4C EF: 50.6 %
Weight: 3379.21 oz

## 2022-10-22 LAB — RESP PANEL BY RT-PCR (RSV, FLU A&B, COVID)  RVPGX2
Influenza A by PCR: NEGATIVE
Influenza B by PCR: NEGATIVE
Resp Syncytial Virus by PCR: NEGATIVE
SARS Coronavirus 2 by RT PCR: POSITIVE — AB

## 2022-10-22 LAB — URINALYSIS, ROUTINE W REFLEX MICROSCOPIC
Bilirubin Urine: NEGATIVE
Glucose, UA: NEGATIVE mg/dL
Hgb urine dipstick: NEGATIVE
Ketones, ur: NEGATIVE mg/dL
Leukocytes,Ua: NEGATIVE
Nitrite: NEGATIVE
Protein, ur: NEGATIVE mg/dL
Specific Gravity, Urine: 1.009 (ref 1.005–1.030)
pH: 6 (ref 5.0–8.0)

## 2022-10-22 LAB — CBC
HCT: 38 % (ref 36.0–46.0)
Hemoglobin: 12.1 g/dL (ref 12.0–15.0)
MCH: 30.6 pg (ref 26.0–34.0)
MCHC: 31.8 g/dL (ref 30.0–36.0)
MCV: 96.2 fL (ref 80.0–100.0)
Platelets: 143 10*3/uL — ABNORMAL LOW (ref 150–400)
RBC: 3.95 MIL/uL (ref 3.87–5.11)
RDW: 13.3 % (ref 11.5–15.5)
WBC: 5.3 10*3/uL (ref 4.0–10.5)
nRBC: 0 % (ref 0.0–0.2)

## 2022-10-22 LAB — PROTIME-INR
INR: 1.2 (ref 0.8–1.2)
Prothrombin Time: 14.7 seconds (ref 11.4–15.2)

## 2022-10-22 LAB — D-DIMER, QUANTITATIVE: D-Dimer, Quant: 3.82 ug/mL-FEU — ABNORMAL HIGH (ref 0.00–0.50)

## 2022-10-22 LAB — HEMOGLOBIN A1C
Hgb A1c MFr Bld: 6.8 % — ABNORMAL HIGH (ref 4.8–5.6)
Mean Plasma Glucose: 148 mg/dL

## 2022-10-22 LAB — T4, FREE: Free T4: 1.04 ng/dL (ref 0.61–1.12)

## 2022-10-22 LAB — TSH: TSH: 0.303 u[IU]/mL — ABNORMAL LOW (ref 0.350–4.500)

## 2022-10-22 LAB — CBG MONITORING, ED: Glucose-Capillary: 111 mg/dL — ABNORMAL HIGH (ref 70–99)

## 2022-10-22 LAB — PROCALCITONIN: Procalcitonin: <0.1 ng/mL

## 2022-10-22 MED ORDER — VITAMIN D 25 MCG (1000 UNIT) PO TABS
10000.0000 [IU] | ORAL_TABLET | Freq: Every day | ORAL | Status: DC
  Administered 2022-10-22: 10000 [IU] via ORAL
  Filled 2022-10-22: qty 10

## 2022-10-22 MED ORDER — SODIUM CHLORIDE 0.9% FLUSH
3.0000 mL | Freq: Two times a day (BID) | INTRAVENOUS | Status: DC
  Administered 2022-10-22 (×2): 3 mL via INTRAVENOUS

## 2022-10-22 MED ORDER — NIRMATRELVIR/RITONAVIR (PAXLOVID)TABLET
3.0000 | ORAL_TABLET | Freq: Two times a day (BID) | ORAL | Status: DC
  Administered 2022-10-22: 3 via ORAL
  Filled 2022-10-22: qty 30

## 2022-10-22 MED ORDER — ONDANSETRON HCL 4 MG/2ML IJ SOLN: 4.0000 mg | Freq: Four times a day (QID) | INTRAMUSCULAR | Status: DC | PRN

## 2022-10-22 MED ORDER — ZINC SULFATE 220 (50 ZN) MG PO CAPS: 220.0000 mg | ORAL_CAPSULE | Freq: Every day | ORAL | 0 refills | Status: DC

## 2022-10-22 MED ORDER — ACETAMINOPHEN 500 MG PO TABS
1000.0000 mg | ORAL_TABLET | Freq: Two times a day (BID) | ORAL | Status: DC | PRN
  Administered 2022-10-22: 1000 mg via ORAL
  Filled 2022-10-22: qty 2

## 2022-10-22 MED ORDER — SODIUM CHLORIDE 0.9 % IV BOLUS (SEPSIS)
500.0000 mL | Freq: Once | INTRAVENOUS | Status: AC
  Administered 2022-10-22: 500 mL via INTRAVENOUS

## 2022-10-22 MED ORDER — IOHEXOL 350 MG/ML SOLN
75.0000 mL | Freq: Once | INTRAVENOUS | Status: AC | PRN
  Administered 2022-10-22: 75 mL via INTRAVENOUS

## 2022-10-22 MED ORDER — ROSUVASTATIN CALCIUM 20 MG PO TABS: 40.0000 mg | ORAL_TABLET | Freq: Every day | ORAL | Status: DC

## 2022-10-22 MED ORDER — ZINC SULFATE 220 (50 ZN) MG PO CAPS
220.0000 mg | ORAL_CAPSULE | Freq: Every day | ORAL | Status: DC
  Administered 2022-10-22: 220 mg via ORAL
  Filled 2022-10-22: qty 1

## 2022-10-22 MED ORDER — HEPARIN SODIUM (PORCINE) 5000 UNIT/ML IJ SOLN
5000.0000 [IU] | Freq: Three times a day (TID) | INTRAMUSCULAR | Status: DC
  Administered 2022-10-22 (×2): 5000 [IU] via SUBCUTANEOUS
  Filled 2022-10-22 (×2): qty 1

## 2022-10-22 MED ORDER — POLYETHYL GLYCOL-PROPYL GLYCOL 0.4-0.3 % OP GEL: Freq: Every day | OPHTHALMIC | Status: DC | PRN

## 2022-10-22 MED ORDER — IOHEXOL 350 MG/ML SOLN
50.0000 mL | Freq: Once | INTRAVENOUS | Status: AC | PRN
  Administered 2022-10-22: 50 mL via INTRAVENOUS

## 2022-10-22 MED ORDER — SODIUM CHLORIDE 0.9 % IV SOLN: INTRAVENOUS | Status: AC

## 2022-10-22 MED ORDER — ALBUTEROL SULFATE (2.5 MG/3ML) 0.083% IN NEBU: 3.0000 mL | INHALATION_SOLUTION | RESPIRATORY_TRACT | Status: DC | PRN

## 2022-10-22 MED ORDER — LORATADINE 10 MG PO TABS
10.0000 mg | ORAL_TABLET | Freq: Every day | ORAL | Status: DC
  Administered 2022-10-22: 10 mg via ORAL
  Filled 2022-10-22: qty 1

## 2022-10-22 MED ORDER — VITAMIN C 500 MG PO TABS
500.0000 mg | ORAL_TABLET | Freq: Every day | ORAL | Status: DC
  Administered 2022-10-22: 500 mg via ORAL
  Filled 2022-10-22: qty 1

## 2022-10-22 MED ORDER — ASCORBIC ACID 500 MG PO TABS: 500.0000 mg | ORAL_TABLET | Freq: Every day | ORAL | 0 refills | Status: DC

## 2022-10-22 MED ORDER — PANTOPRAZOLE SODIUM 40 MG PO TBEC
40.0000 mg | DELAYED_RELEASE_TABLET | Freq: Every day | ORAL | Status: DC
  Administered 2022-10-22: 40 mg via ORAL
  Filled 2022-10-22: qty 1

## 2022-10-22 MED ORDER — ONDANSETRON HCL 4 MG PO TABS: 4.0000 mg | ORAL_TABLET | Freq: Four times a day (QID) | ORAL | Status: DC | PRN

## 2022-10-22 MED ORDER — MONTELUKAST SODIUM 10 MG PO TABS: 10.0000 mg | ORAL_TABLET | Freq: Every day | ORAL | Status: DC

## 2022-10-22 MED ORDER — SERTRALINE HCL 100 MG PO TABS
100.0000 mg | ORAL_TABLET | Freq: Every morning | ORAL | Status: DC
  Administered 2022-10-22: 100 mg via ORAL
  Filled 2022-10-22: qty 1

## 2022-10-22 MED ORDER — SUCRALFATE 1 G PO TABS
1.0000 g | ORAL_TABLET | Freq: Three times a day (TID) | ORAL | Status: DC
  Administered 2022-10-22: 1 g via ORAL
  Filled 2022-10-22: qty 1

## 2022-10-22 NOTE — Hospital Course (Addendum)
Take taken from H&P.   [name] is a 82 y.o. female with medical history significant of   hypertension, occasional PVC , asthma, anxiety, GERD, diabetes mellitus, fibromyalgia ,OSA on CPAP,Follows with cardiology for progressive doe and lower extremity edema with concern for heart failure. Of note  on last cardiology visit 12/28 , patient was seen an started on lasix for noted symptoms suggest of heart failure. She was place on lasix '40mg'$  po x 5 days with plans for repeat echo and f/u in 6 weeks.  Presented to ED with plaint of syncopal episode X 3, while trying to get out of bed.  Patient was recently diagnosed with COVID-19 just prior to Christmas, over the past couple of days had poor p.o. intake and noted to have a fever at home.  She notes no chest pain ,n/v/diaphoresis /abdominal pain / dysuria/ diarrhea associated with onset of these symptoms.  Similar episodes a year ago when she was given a Holter monitor and noted to have essentially normal rhythm with occasional PVCs, PACs and short runs of SVT with a mean heart rate of 63." Patient currently states she feels fatigued and notes ongoing DOE not resolved by recent 5 days lasix course that ended on  10/16/2022.     ED course.  Vital stable.  Labs mostly unremarkable.  Respiratory panel positive for COVID.  BNP normal at 74.6.  Chest x-ray without any acute abnormality and mild cardiomegaly. EKG with sinus rhythm.  Patient received total of 1 L of normal saline in ED.  Patient remained symptomatic with ambulation.  TRH was consulted for admission. Echocardiogram was ordered.  Prior echocardiogram done in February 2023 was with normal EF, grade 1 diastolic dysfunction and no other significant abnormality except mildly dilated ascending aorta.  Repeat echocardiogram with similar findings and no new changes.  Cardiology evaluated her and there does not think that exertional dyspnea is due to cardiac reason, up.  Apparently patient had extensive  cardiac workup this year and a calcium score of 0.  Cardiology cleared her for discharge and she will follow-up with her cardiologist as an outpatient for further recommendations.  Patient was also complaining of a right-sided supraclavicular lymph node, apparently increasing in size per patient she wants it get investigated, CT neck soft tissue was obtained and it did not show any discrete masses or lymph node.  Some concern of heterogenicity in thyroid with right lobe asymmetric.  Thyroid ultrasound was negative for any thyroid nodules.  Patient had exertional dyspnea and most likely some syncopal episode secondary to dehydration with poor p.o. intake and recent COVID.  Orthostatic vitals were negative.  Patient is clinically euvolemic and does not need more diuresis at this time.  She will continue with her home medications and follow-up with her providers closely for further recommendations.

## 2022-10-22 NOTE — H&P (Signed)
History and Physical    [name]:[id number] DOB: [birth year] DOA: 10/21/2022  PCP: Fanny Bien, MD  Patient coming from: home  I have personally briefly reviewed patient's old medical records in [name]  Chief Complaint: syncope  HPI: [name] is a 82 y.o. female with medical history significant of   hypertension, occasional PVC , asthma, anxiety, GERD, diabetes mellitus, fibromyalgia ,OSA on CPAP,Follows with cardiology for progressive doe and lower extremity edema with concern for heart failure. Of note  on last cardiology visit 12/28 , patient was seen an started on lasix for noted symptoms suggest of heart failure. She was place on lasix '40mg'$  po x 5 days with plans for repeat echo and f/u in 6 weeks. Patient now presents to ED BIB EMS with complaints of syncope x 3 .Patient appears to have had recent diagnosis of COVID-19 prior to Christmas , but noted over the last 2 days has had decreae po intake and noted fever of 101 at home.  Patient states on attempt to get out of her bed on standing she had 3 episode of short lived syncope. She notes no chest pain ,n/v/diaphoresis /abdominal pain / dysuria/ diarrhea associated with onset of these symptoms. Of note patient has had episode like this in the past 1st of which was one year ago . Per cardiology note patient had recent holter that ntoed "essentially normal rhythm with occasional PVCs, PACs and short runs of SVT with a mean heart rate of 63." Patient currently states she feels fatigued and notes ongoing DOE not resolved by recent 5 days lasix course that ended on  10/16/2022.   ED Course:  Afeb(98.3), bp 105/93, hr 79 sat 97%  Cxr  Lungs are well expanded, symmetric, and clear. No pneumothorax or pleural effusion. Cardiac size is mildly enlarged. Pulmonary vascularity is normal. Osseous structures are age-appropriate. No acute bone abnormality.   IMPRESSION: 1. Mild cardiomegaly. Labs: Wbc 5.3, hg 13.1, plt 151 N a  135, K 3.9, CL 102 , glu 119, cr 1.22 (0.94) ca 8.4,  BNP 74.6   EKG: sinus rhythm  Respiratory panel + covid  Tx: S/p 500 nsx2   Review of Systems: As per HPI otherwise 10 point review of systems negative.   Past Medical History:  Diagnosis Date   Allergy    Anal or rectal pain    sometimes   Anemia    hx of during pregnancy   Anxiety    Arthritis    Asthma    hx of   Bradycardia    " I KNOW I HAVE BRADYCARDIA ESPECIALLY WHEN I SLEEP"    Cataract 2021   bilateral eyes   Chronic back pain    Degenerative joint disease    osteo   Depression    Diabetes mellitus without complication ([facility])    DM type II   Diverticulosis 2003   Dysrhythmia    hx of  due to eye drop and also low heart rate 40's per pt. Dr. Gwenlyn Found follows   Elevated total protein    Esophageal dysmotility    Fibromyalgia    GERD (gastroesophageal reflux disease)    subsequent Nissen Fundoplication   Glaucoma    Hearing loss    Heart murmur    "was told she had a heart murmur"   Hemorrhoids    Hiatal hernia 11/08/09   Hx of adenomatous colonic polyps 07/02/02   Hypercalcemia    Hyperlipidemia    Hyperlipidemia  Hypertension    Nausea    Osteoporosis    PONV (postoperative nausea and vomiting)    Rectal bleeding    from hemorrhoids.     Sleep apnea    DOES USE CPAP    Thrombocytopenia (HCC)    Varicose veins of left lower extremity     Past Surgical History:  Procedure Laterality Date   CARDIAC CATHETERIZATION  03/14/1992   Normal cardiac cath. Normal LV function.   CARDIOVASCULAR STRESS TEST  01/22/2011   No scintigraphic evidence of inducible ischemia.   CAROTID DOPPLER  03/31/2007   Bilateral ICAs - no evidence of significant diameter reduction, dissectin, tortuosity, FMD, or any other vascular abnormality.   CATARACT EXTRACTION, BILATERAL     ESOPHAGEAL MANOMETRY N/A 11/14/2015   Procedure: ESOPHAGEAL MANOMETRY (EM);  Surgeon: Mauri Pole, MD;  Location: [facility];  Service:  Endoscopy;  Laterality: N/A;   ESOPHAGEAL MANOMETRY N/A 01/18/2021   Procedure: ESOPHAGEAL MANOMETRY (EM);  Surgeon: Mauri Pole, MD;  Location: [facility];  Service: Endoscopy;  Laterality: N/A;   ESOPHAGOGASTRODUODENOSCOPY N/A 03/08/2021   Procedure: ESOPHAGOGASTRODUODENOSCOPY (EGD);  Surgeon: Lajuana Matte, MD;  Location: [facility];  Service: Thoracic;  Laterality: N/A;   EYE SURGERY     bilateral cataracts; bilateral stents   GASTRIC RESECTION  2009   GLAUCOMA SURGERY     JOINT REPLACEMENT     right knee Dr. Wynelle Link 06-23-18   KNEE SURGERY Bilateral    NISSEN FUNDOPLICATION  9485   with subsequent takedown in 2009   [place] Left 06/10/2017   Procedure: LEFT  TOTAL KNEE ARTHROPLASTY;  Surgeon: Gaynelle Arabian, MD;  Location: [facility];  Service: Orthopedics;  Laterality: Left;  Adductor Block   TOTAL KNEE ARTHROPLASTY Right 06/23/2018   Procedure: RIGHT TOTAL KNEE ARTHROPLASTY;  Surgeon: Gaynelle Arabian, MD;  Location: [facility];  Service: Orthopedics;  Laterality: Right;   TRANSTHORACIC ECHOCARDIOGRAM  12/21/2010   EF 60%, moderate LVH,    TUBAL LIGATION     XI ROBOTIC ASSISTED HIATAL HERNIA REPAIR N/A 03/08/2021   Procedure: XI ROBOTIC ASSISTED LAPAROSCOPY WITH LYSIS OF ADHESIONS;  Surgeon: Lajuana Matte, MD;  Location: [facility];  Service: Thoracic;  Laterality: N/A;     reports that she has never smoked. She has never used smokeless tobacco. She reports that she does not drink alcohol and does not use drugs.  Allergies  Allergen Reactions   Adalat [Nifedipine] Other (See Comments)    Tired, Made pt "feel crazy in the head"   Hydrocodone Itching and Nausea And Vomiting   Meperidine Hcl Other (See Comments)    Demerol causes hallucinations   Naproxen Nausea And Vomiting    Family History  Problem Relation Age of Onset   Heart disease Mother    Hypertension Mother    Diabetes Mother    Diabetes Father    Kidney disease Father    Stroke Brother    Cancer  Maternal Grandmother    Kidney disease Maternal Grandfather    Breast cancer Daughter    Stomach cancer Maternal Aunt    Uterine cancer Maternal Aunt    Leukemia Maternal Uncle    Prostate cancer Maternal Uncle    Colon cancer Neg Hx    Rectal cancer Neg Hx    Esophageal cancer Neg Hx     Prior to Admission medications   Medication Sig Start Date End Date Taking? Authorizing Provider  acetaminophen (TYLENOL) 500 MG tablet Take 1,000 mg by  mouth 2 (two) times daily as needed for headache (back and knee pain).   Yes [provider]  albuterol (VENTOLIN HFA) 108 (90 Base) MCG/ACT inhaler Inhale 1 puff into the lungs as needed for wheezing or shortness of breath. 01/29/22  Yes [provider]  brimonidine (ALPHAGAN) 0.2 % ophthalmic solution 1 drop 2 (two) times daily. 09/26/22  Yes [provider]  cetirizine (ZYRTEC) 10 MG tablet Take 10 mg by mouth at bedtime.   Yes [provider]  Cholecalciferol (VITAMIN D3 PO) Take 3 capsules by mouth daily in the afternoon.   Yes [provider]  Cholecalciferol (VITAMIN D3) 250 MCG (10000 UT) capsule Take 10,000 Units by mouth daily.   Yes [provider]  dexlansoprazole (DEXILANT) 60 MG capsule Take 1 capsule (60 mg total) by mouth daily. 11/13/21  Yes Esterwood, Amy S, PA-C  losartan (COZAAR) 25 MG tablet Take 25 mg by mouth 2 (two) times daily. 08/29/22  Yes [provider]  montelukast (SINGULAIR) 10 MG tablet Take 10 mg by mouth at bedtime.   Yes [provider]  nystatin (MYCOSTATIN/NYSTOP) powder Apply 1 Application topically daily as needed (for rash). 11/20/16  Yes [provider]  nystatin ointment (MYCOSTATIN) Apply 1 application  topically See admin instructions. 1 application 1-2 times a day as needed for rash 11/20/16  Yes [provider]  Polyethyl Glycol-Propyl Glycol (SYSTANE OP) Place 1 drop into both eyes daily as needed (dry/irritated eyes).   Yes  [provider]  rosuvastatin (CRESTOR) 40 MG tablet Take 40 mg by mouth at bedtime.   Yes [provider]  sertraline (ZOLOFT) 100 MG tablet Take 100 mg by mouth every morning. 03/29/22  Yes [provider]  sucralfate (CARAFATE) 1 g tablet Take 1 tablet (1 g total) by mouth 4 (four) times daily -  with meals and at bedtime. Patient taking differently: Take 1 g by mouth at bedtime. 11/13/21  Yes Esterwood, Amy S, PA-C  furosemide (LASIX) 40 MG tablet Take 1 tablet (40 mg total) by mouth daily. Patient not taking: Reported on 10/22/2022 10/11/22   Tobb, Kardie, DO  hydrochlorothiazide (HYDRODIURIL) 25 MG tablet Take 25 mg by mouth every morning. Patient not taking: Reported on 10/22/2022 06/18/22   [provider]  potassium chloride SA (KLOR-CON M) 20 MEQ tablet Take 1 tablet (20 mEq total) by mouth daily. Patient not taking: Reported on 10/22/2022 10/11/22   Berniece Salines, DO    Physical Exam: Vitals:   10/21/22 2340 10/22/22 0000 10/22/22 0030 10/22/22 0130  BP: 124/62 (!) 153/48 (!) 163/68 (!) 145/82  Pulse: 68 66 71 71  Resp: '13 20 16 19  '$ Temp: 99.9 F (37.7 C)     TempSrc: Oral     SpO2: 97% 99% 97% 99%  Weight: 95.8 kg     Height: '5\' 10"'$  (1.778 m)       Constitutional: NAD, calm, comfortable Vitals:   10/21/22 2340 10/22/22 0000 10/22/22 0030 10/22/22 0130  BP: 124/62 (!) 153/48 (!) 163/68 (!) 145/82  Pulse: 68 66 71 71  Resp: '13 20 16 19  '$ Temp: 99.9 F (37.7 C)     TempSrc: Oral     SpO2: 97% 99% 97% 99%  Weight: 95.8 kg     Height: '5\' 10"'$  (1.778 m)      Eyes: PERRL, lids and conjunctivae normal ENMT: Mucous membranes are moist. Posterior pharynx clear of any exudate or lesions.Normal dentition.  Neck: normal, supple, no masses,  no thyromegaly Respiratory: clear to auscultation bilaterally, no wheezing, no crackles. Normal respiratory effort. No accessory muscle use.  Cardiovascular: Regular rate and rhythm, no murmurs / rubs / gallops. No  extremity edema. 2+ pedal pulses. Abdomen: no tenderness, no masses palpated. No hepatosplenomegaly. Bowel sounds positive.  Musculoskeletal: no clubbing / cyanosis. No joint deformity upper and lower extremities. Good ROM, no contractures. Normal muscle tone.  Skin: no rashes, lesions, ulcers. No induration Neurologic: CN 2-12 grossly intact. Sensation intact, . Strength 5/5 in all 4.  Psychiatric: Normal judgment and insight. Alert and oriented x 3. Normal mood.    Labs on Admission: I have personally reviewed following labs and imaging studies  CBC: Recent Labs  Lab 10/21/22 2147  WBC 5.3  NEUTROABS 3.6  HGB 13.1  HCT 40.0  MCV 96.6  PLT 628   Basic Metabolic Panel: Recent Labs  Lab 10/21/22 2147  NA 135  K 3.9  CL 102  CO2 26  GLUCOSE 119*  BUN 17  CREATININE 1.22*  CALCIUM 8.4*   GFR: Estimated Creatinine Clearance: 46.1 mL/min (A) (by C-G formula based on SCr of 1.22 mg/dL (H)). Liver Function Tests: No results for input(s): "AST", "ALT", "ALKPHOS", "BILITOT", "PROT", "ALBUMIN" in the last 168 hours. No results for input(s): "LIPASE", "AMYLASE" in the last 168 hours. No results for input(s): "AMMONIA" in the last 168 hours. Coagulation Profile: No results for input(s): "INR", "PROTIME" in the last 168 hours. Cardiac Enzymes: No results for input(s): "CKTOTAL", "CKMB", "CKMBINDEX", "TROPONINI" in the last 168 hours. BNP (last 3 results) Recent Labs    10/11/22 0958  PROBNP 181   HbA1C: No results for input(s): "HGBA1C" in the last 72 hours. CBG: No results for input(s): "GLUCAP" in the last 168 hours. Lipid Profile: No results for input(s): "CHOL", "HDL", "LDLCALC", "TRIG", "CHOLHDL", "LDLDIRECT" in the last 72 hours. Thyroid Function Tests: No results for input(s): "TSH", "T4TOTAL", "FREET4", "T3FREE", "THYROIDAB" in the last 72 hours. Anemia Panel: No results for input(s): "VITAMINB12", "FOLATE", "FERRITIN", "TIBC", "IRON", "RETICCTPCT" in the last 72  hours. Urine analysis:    Component Value Date/Time   COLORURINE YELLOW 10/26/2021 2100   APPEARANCEUR CLEAR 10/26/2021 2100   LABSPEC 1.044 (H) 10/26/2021 2100   PHURINE 6.5 10/26/2021 2100   GLUCOSEU NEGATIVE 10/26/2021 2100   HGBUR NEGATIVE 10/26/2021 2100   BILIRUBINUR NEGATIVE 10/26/2021 2100   KETONESUR NEGATIVE 10/26/2021 2100   PROTEINUR NEGATIVE 10/26/2021 2100   UROBILINOGEN 0.2 06/09/2015 1931   NITRITE NEGATIVE 10/26/2021 2100   LEUKOCYTESUR TRACE (A) 10/26/2021 2100    Radiological Exams on Admission: DG Chest 2 View  Result Date: 10/21/2022 CLINICAL DATA:  Dyspnea EXAM: CHEST - 2 VIEW COMPARISON:  None Available. FINDINGS: Lungs are well expanded, symmetric, and clear. No pneumothorax or pleural effusion. Cardiac size is mildly enlarged. Pulmonary vascularity is normal. Osseous structures are age-appropriate. No acute bone abnormality. IMPRESSION: 1. Mild cardiomegaly. Electronically Signed   By: Fidela Salisbury M.D.   On: 10/21/2022 23:52    EKG: Independently reviewed.   Assessment/Plan  Syncope in setting of COVD-19 --presumed due to orthostasis related to covid/poor po intake  -supportive care with ivfs -repeat orthostatics in am  -of note however patient with hx of syncope in past /with recent unremarkable event monitor -echo in am  -cardiology consult in am   Hypertension  -hold all meds , resume once patient not orthostatic    Asthma -no acute exacerbation    Anxiety -stable    GERD -ppi  Diabetes mellitus -last A1c -iss/fs   Fibromyalgia  -resume home regimen  OSA  -cpap qhs   DVT prophylaxis: lovenox Code Status: full/ as discussed per patient wishes in event of cardiac arrest  Family Communication: none at bedside Disposition Plan: patient  expected to be admitted greater than 2 midnights  Consults called: cardiology DR Hochrein Admission status: cardiac tele  Clance Boll MD [facility]   If 7PM-7AM, please  contact night-coverage [email or web address] Password [email or web address]  10/22/2022, 2:02 AM

## 2022-10-22 NOTE — Evaluation (Addendum)
Occupational Therapy Evaluation Patient Details Name: [name] MRN: [id number] DOB: September 02, 1941 Today's Date: 10/22/2022   History of Present Illness [name] is a 82 y.o. female who presented to ED with plaint of syncopal episode x3. Of note, pt was diagnosed with COVID just prior to Christmas. PMHx: hypertension, occasional PVC , asthma, anxiety, GERD, diabetes mellitus, fibromyalgia ,OSA on CPAP   Clinical Impression   [name] was evaluated s/p the above admission list, she is typically mod I at baseline and lives with her husband with other family members close by. Upon evaluation pt had mild functional limitations due to generalized weakness, fatigue and unsteady gait. Overall she required up to min G for mobility and LB ADLs, no overt LOB or safety concern noted. Pt would benefit from OT acutely. Recommend d/c to home with support of family.   VSS and BP stable throughout positional changes and after mobility.      Recommendations for follow up therapy are one component of a multi-disciplinary discharge planning process, led by the attending physician.  Recommendations may be updated based on patient status, additional functional criteria and insurance authorization.   Follow Up Recommendations  No OT follow up     Assistance Recommended at Discharge Intermittent Supervision/Assistance  Patient can return home with the following A little help with walking and/or transfers;A little help with bathing/dressing/bathroom;Assistance with cooking/housework;Assist for transportation;Help with stairs or ramp for entrance    Functional Status Assessment  Patient has had a recent decline in their functional status and demonstrates the ability to make significant improvements in function in a reasonable and predictable amount of time.  Equipment Recommendations  None recommended by OT    Recommendations for Other Services       Precautions / Restrictions Precautions Precautions:  Fall Restrictions Weight Bearing Restrictions: No      Mobility Bed Mobility Overal bed mobility: Modified Independent                  Transfers Overall transfer level: Needs assistance Equipment used: None Transfers: Sit to/from Stand Sit to Stand: Min guard           General transfer comment: from elevated stretcher height      Balance Overall balance assessment: Needs assistance Sitting-balance support: Feet supported Sitting balance-Leahy Scale: Good     Standing balance support: Single extremity supported, During functional activity Standing balance-Leahy Scale: Fair                             ADL either performed or assessed with clinical judgement   ADL Overall ADL's : Needs assistance/impaired Eating/Feeding: Independent;Sitting   Grooming: Min guard;Standing   Upper Body Bathing: Set up;Sitting   Lower Body Bathing: Min guard;Sit to/from stand   Upper Body Dressing : Set up;Sitting   Lower Body Dressing: Min guard;Sit to/from stand   Toilet Transfer: Min guard;Ambulation;Comfort height toilet Toilet Transfer Details (indicate cue type and reason): pushing IV pole Toileting- Clothing Manipulation and Hygiene: Supervision/safety;Sitting/lateral lean       Functional mobility during ADLs: Min guard General ADL Comments: from stretcher, initally unsteady but balance improved with mobility     Vision Baseline Vision/History: 1 Wears glasses Vision Assessment?: No apparent visual deficits     Perception Perception Perception Tested?: No   Praxis Praxis Praxis tested?: Not tested    Pertinent Vitals/Pain Pain Assessment Pain Assessment: Faces Faces Pain Scale: No hurt Pain Intervention(s): Monitored during  session     Hand Dominance Right   Extremity/Trunk Assessment Upper Extremity Assessment Upper Extremity Assessment: Overall WFL for tasks assessed   Lower Extremity Assessment Lower Extremity Assessment: Defer  to PT evaluation (baseline R hip pain)   Cervical / Trunk Assessment Cervical / Trunk Assessment: Normal   Communication     Cognition Arousal/Alertness: Awake/alert Behavior During Therapy: WFL for tasks assessed/performed Overall Cognitive Status: Within Functional Limits for tasks assessed                                       General Comments  VSS on RA, BP stable through positional changes. daughter present    Exercises     Shoulder Instructions      Home Living Family/patient expects to be discharged to:: Private residence Living Arrangements: Spouse/significant other Available Help at Discharge: Family;Available 24 hours/day Type of Home: House Home Access: Stairs to enter [employer] of Steps: 2 Entrance Stairs-Rails: None Home Layout: One level;Other (Comment) (1 step to the dining room)     Bathroom Shower/Tub: Occupational psychologist: Standard     Home Equipment: Conservation officer, nature (2 wheels);Cane - single point;Shower seat - built in          Prior Functioning/Environment Prior Level of Function : Independent/Modified Independent;Driving             Mobility Comments: SPC vs RW for community mobility, furniture surfs in the home ADLs Comments: generally mod I        OT Problem List: Decreased activity tolerance;Impaired balance (sitting and/or standing)      OT Treatment/Interventions: Self-care/ADL training;DME and/or AE instruction;Therapeutic activities;Patient/family education;Balance training    OT Goals(Current goals can be found in the care plan section) Acute Rehab OT Goals Patient Stated Goal: home today OT Goal Formulation: With patient Time For Goal Achievement: 11/05/22 Potential to Achieve Goals: Good ADL Goals Additional ADL Goal #1: pt will indep complete BADLs  OT Frequency: Min 2X/week    Co-evaluation              AM-PAC OT "6 Clicks" Daily Activity     Outcome Measure Help from  another person eating meals?: None Help from another person taking care of personal grooming?: A Little Help from another person toileting, which includes using toliet, bedpan, or urinal?: A Little Help from another person bathing (including washing, rinsing, drying)?: A Little Help from another person to put on and taking off regular upper body clothing?: None Help from another person to put on and taking off regular lower body clothing?: A Little 6 Click Score: 20   End of Session Nurse Communication: Mobility status  Activity Tolerance: Patient tolerated treatment well Patient left: in bed;with call bell/phone within reach;with family/visitor present  OT Visit Diagnosis: Unsteadiness on feet (R26.81);Other abnormalities of gait and mobility (R26.89)                Time: 6213-0865 OT Time Calculation (min): 15 min Charges:  OT General Charges $OT Visit: 1 Visit OT Evaluation $OT Eval Moderate Complexity: 1 Mod    Rithvik Orcutt D Causey 10/22/2022, 4:51 PM

## 2022-10-22 NOTE — Care Management CC44 (Signed)
Condition Code 44 Documentation Completed  Patient Details  Name: BABETTE STUM MRN: [id number] Date of Birth: 04-22-1941   Condition Code 44 given:  Yes Patient signature on Condition Code 44 notice:  Yes Documentation of 2 MD's agreement:  Yes Code 44 added to claim:  Yes    Laurena Slimmer, RN 10/22/2022, 3:49 PM

## 2022-10-22 NOTE — Consult Note (Addendum)
Cardiology Consultation   Patient ID: [name] MRN: [id number]; DOB: November 18, 1940  Admit date: 10/21/2022 Date of Consult: 10/22/2022  PCP:  Fanny Bien, MD   [place] Prov[facility] Cardiologist:  Quay Burow, MD   / Dr.  Harriet Masson     Patient Profile:   [name] is a 82 y.o. 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 Reesa Chew.  History of Present Illness:   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  She previously underwent cardiac catheterization by Dr. Melvern Banker in May 1993 that showed normal coronary arteries.  Patient was evaluated in the hospital in October 2021 for chest pain and shortness of breath.  2D echo and CTA of the chest were unremarkable.  Coronary CTA obtained on 12/04/2021 showed normal coronary arteries, coronary calcium score of 0.  Echocardiogram obtained on 11/17/2021 showed EF 60 to 65%, mild LVH, no regional wall motion abnormality, grade 1 DD, mild to moderate LAE, trivial MR.  Subsequent heart monitor in February showed minimal heart rate 40 bpm, maximal heart rate 120, average heart rate 83 bpm.  Predominantly sinus rhythm with 1 run of SVT lasting only 4 beats, less than 1% PAC and PVCs.  She was admitted to the hospital in August 2023 with exertional chest pain.  ETT was normal in August 2023.  Her HCTZ was discontinued due to hypotension.  She was left on 25 mg twice a day of losartan.  Patient was recently switched from Dr. Gwenlyn Found to Dr. Harriet Masson and was last seen by Dr. Harriet Masson on 10/03/2022.  Patient presented to the ED on 10/21/2022 after  a syncopal episode.  Talking with the patient, she says she has frequent episode of dizziness especially when she stands up.  Symptom actually has been going on for over a year.  She did have a previous passing out spell back in January 2023 as well.  She says the only time the symptom occurs is when she is standing.  It can happen shortly after she stands up or after a few minutes of standing.  She says she had 3 episodes of dizzy spell yesterday.  The first episode occurred after she got up to walk to the bathroom, she leaned her back to the wall to prevent her from falling back down.  She was trying to stand up next to her husband when she had another episode of severe dizziness, her husband called her before she fell to the ground and laid her in the bed.  She did not have any urinary or bowel incontinence.  She did not have any seizure-like activity.  She did mention that her blood pressure upon standing yesterday was as low as 84/34 based on home blood pressure cuff.  Patient was eventually sent to [facility] for further evaluation.  Telemetry while in the emergency room showed sinus bradycardia in the low 50s however no significant pauses or severe bradycardia to explain the passing out spell.  D-dimer was elevated, CTA of the chest was negative for PE however showed possible pulmonary arterial hypertension.  Thyroid ultrasound was normal.  TSH borderline low, free T4 normal.  COVID test positive, per report, patient had a positive home test 10/04/2022.  Cardiology service consulted for recurrent dizziness and syncope.   Past Medical History:  Diagnosis Date   Allergy    Anal or rectal pain    sometimes   Anemia    hx of during pregnancy   Anxiety    Arthritis    Asthma    hx of   Bradycardia    " I KNOW I HAVE BRADYCARDIA ESPECIALLY WHEN I SLEEP"    Cataract 2021   bilateral eyes   Chronic back pain    Degenerative joint disease    osteo   Depression    Diabetes mellitus without  complication ([place])    DM type II   Diverticulosis 2003   Dysrhythmia    hx of  due to eye drop and also low heart rate 40's per pt. Dr. Gwenlyn Found follows   Elevated total protein    Esophageal dysmotility    Fibromyalgia    GERD (gastroesophageal reflux disease)    subsequent Nissen Fundoplication   Glaucoma    Hearing loss    Heart murmur    "was told she had a heart murmur"   Hemorrhoids    Hiatal hernia 11/08/09   Hx of adenomatous colonic polyps 07/02/02   Hypercalcemia    Hyperlipidemia    Hyperlipidemia    Hypertension    Nausea    Osteoporosis    PONV (postoperative nausea and vomiting)    Rectal bleeding    from hemorrhoids.     Sleep apnea    DOES USE CPAP    Thrombocytopenia (HCC)    Varicose veins of left lower extremity     Past Surgical History:  Procedure Laterality Date   CARDIAC CATHETERIZATION  03/14/1992   Normal cardiac cath. Normal LV function.   CARDIOVASCULAR STRESS TEST  01/22/2011   No scintigraphic evidence of inducible ischemia.   CAROTID DOPPLER  03/31/2007   Bilateral ICAs - no evidence of significant diameter reduction, dissectin, tortuosity, FMD, or any other vascular abnormality.   CATARACT EXTRACTION, BILATERAL     ESOPHAGEAL MANOMETRY N/A 11/14/2015   Procedure: ESOPHAGEAL MANOMETRY (EM);  Surgeon: Mauri Pole, MD;  Location: [facility];  Service: Endoscopy;  Laterality: N/A;   ESOPHAGEAL MANOMETRY N/A 01/18/2021   Procedure: ESOPHAGEAL MANOMETRY (EM);  Surgeon: Mauri Pole, MD;  Location: [facility];  Service: Endoscopy;  Laterality: N/A;   ESOPHAGOGASTRODUODENOSCOPY N/A 03/08/2021   Procedure: ESOPHAGOGASTRODUODENOSCOPY (EGD);  Surgeon: Lajuana Matte, MD;  Location: [facility];  Service: Thoracic;  Laterality: N/A;   EYE SURGERY     bilateral cataracts; bilateral stents   GASTRIC RESECTION  2009   GLAUCOMA SURGERY     JOINT REPLACEMENT     right knee Dr. Wynelle Link 06-23-18   KNEE SURGERY Bilateral    NISSEN FUNDOPLICATION   8469   with subsequent takedown in 2009   [place] Left 06/10/2017   Procedure: LEFT  TOTAL KNEE ARTHROPLASTY;  Surgeon: Gaynelle Arabian, MD;  Location: [facility];  Service: Orthopedics;  Laterality: Left;  Adductor Block   TOTAL KNEE ARTHROPLASTY Right 06/23/2018   Procedure: RIGHT TOTAL KNEE ARTHROPLASTY;  Surgeon: Gaynelle Arabian, MD;  Location: [facility];  Service: Orthopedics;  Laterality: Right;   TRANSTHORACIC ECHOCARDIOGRAM  12/21/2010   EF 60%, moderate LVH,    TUBAL LIGATION     XI ROBOTIC ASSISTED HIATAL HERNIA REPAIR N/A 03/08/2021   Procedure: XI ROBOTIC ASSISTED LAPAROSCOPY WITH LYSIS  OF ADHESIONS;  Surgeon: Lajuana Matte, MD;  Location: [facility];  Service: Thoracic;  Laterality: N/A;     Home Medications:  Prior to Admission medications   Medication Sig Start Date End Date Taking? Authorizing Provider  acetaminophen (TYLENOL) 500 MG tablet Take 1,000 mg by mouth 2 (two) times daily as needed for headache (back and knee pain).   Yes [provider]  albuterol (VENTOLIN HFA) 108 (90 Base) MCG/ACT inhaler Inhale 1 puff into the lungs as needed for wheezing or shortness of breath. 01/29/22  Yes [provider]  brimonidine (ALPHAGAN) 0.2 % ophthalmic solution 1 drop 2 (two) times daily. 09/26/22  Yes [provider]  cetirizine (ZYRTEC) 10 MG tablet Take 10 mg by mouth at bedtime.   Yes [provider]  Cholecalciferol (VITAMIN D3) 250 MCG (10000 UT) capsule Take 10,000 Units by mouth daily.   Yes [provider]  dexlansoprazole (DEXILANT) 60 MG capsule Take 1 capsule (60 mg total) by mouth daily. 11/13/21  Yes Esterwood, Amy S, PA-C  losartan (COZAAR) 25 MG tablet Take 25 mg by mouth 2 (two) times daily. 08/29/22  Yes [provider]  montelukast (SINGULAIR) 10 MG tablet Take 10 mg by mouth at bedtime.   Yes [provider]  nystatin (MYCOSTATIN/NYSTOP) powder Apply 1 Application topically daily as needed (for  rash). 11/20/16  Yes [provider]  nystatin ointment (MYCOSTATIN) Apply 1 application  topically See admin instructions. 1 application 1-2 times a day as needed for rash 11/20/16  Yes [provider]  Polyethyl Glycol-Propyl Glycol (SYSTANE OP) Place 1 drop into both eyes daily as needed (dry/irritated eyes).   Yes [provider]  rosuvastatin (CRESTOR) 40 MG tablet Take 40 mg by mouth at bedtime.   Yes [provider]  sertraline (ZOLOFT) 100 MG tablet Take 100 mg by mouth every morning. 03/29/22  Yes [provider]  sucralfate (CARAFATE) 1 g tablet Take 1 tablet (1 g total) by mouth 4 (four) times daily -  with meals and at bedtime. Patient taking differently: Take 1 g by mouth at bedtime. 11/13/21  Yes Esterwood, Amy S, PA-C  furosemide (LASIX) 40 MG tablet Take 1 tablet (40 mg total) by mouth daily. Patient not taking: Reported on 10/22/2022 10/11/22   Tobb, Kardie, DO  hydrochlorothiazide (HYDRODIURIL) 25 MG tablet Take 25 mg by mouth every morning. Patient not taking: Reported on 10/22/2022 06/18/22   [provider]  potassium chloride SA (KLOR-CON M) 20 MEQ tablet Take 1 tablet (20 mEq total) by mouth daily. Patient not taking: Reported on 10/22/2022 10/11/22   Berniece Salines, DO    Inpatient Medications: Scheduled Meds:  vitamin C  500 mg Oral Daily   cholecalciferol  10,000 Units Oral Daily   heparin  5,000 Units Subcutaneous Q8H   loratadine  10 mg Oral Daily   montelukast  10 mg Oral QHS   nirmatrelvir/ritonavir  3 tablet Oral BID   pantoprazole  40 mg Oral Daily   sertraline  100 mg Oral q morning   sodium chloride flush  3 mL Intravenous Q12H   sucralfate  1 g Oral TID WC & HS   zinc sulfate  220 mg Oral Daily   Continuous Infusions:  sodium chloride 75 mL/hr at 10/22/22 0406   PRN Meds: acetaminophen, albuterol, ondansetron **OR** ondansetron (ZOFRAN) IV, Polyethyl Glycol-Propyl Glycol  Allergies:    Allergies  Allergen  Reactions   Adalat [Nifedipine] Other (See Comments)  Tired, Made pt "feel crazy in the head"   Hydrocodone Itching and Nausea And Vomiting   Meperidine Hcl Other (See Comments)    Demerol causes hallucinations   Naproxen Nausea And Vomiting    Social History:   Social History   Socioeconomic History   Marital status: Married    Spouse name: Not on file   Number of children: 8   Years of education: Not on file   Highest education level: Not on file  Occupational History   Occupation: retired    Comment: retired Therapist, art.   Tobacco Use   Smoking status: Never   Smokeless tobacco: Never  Vaping Use   Vaping Use: Never used  Substance and Sexual Activity   Alcohol use: No    Alcohol/week: 0.0 standard drinks of alcohol   Drug use: No   Sexual activity: Yes  Other Topics Concern   Not on file  Social History Narrative   Husband, Lillyan Hitson is Next of Kin. Cell # [phone]   Social Determinants of Health   Financial Resource Strain: Not on file  Food Insecurity: Not on file  Transportation Needs: Not on file  Physical Activity: Not on file  Stress: Not on file  Social Connections: Not on file  Intimate Partner Violence: Not on file    Family History:    Family History  Problem Relation Age of Onset   Heart disease Mother    Hypertension Mother    Diabetes Mother    Diabetes Father    Kidney disease Father    Stroke Brother    Cancer Maternal Grandmother    Kidney disease Maternal Grandfather    Breast cancer Daughter    Stomach cancer Maternal Aunt    Uterine cancer Maternal Aunt    Leukemia Maternal Uncle    Prostate cancer Maternal Uncle    Colon cancer Neg Hx    Rectal cancer Neg Hx    Esophageal cancer Neg Hx      ROS:  Please see the history of present illness.   All other ROS reviewed and negative.     Physical Exam/Data:   Vitals:   10/22/22 0430 10/22/22 0600 10/22/22 0900 10/22/22 1009  BP: (!) 127/90 (!) 153/66    Pulse:  (!) 57 (!) 56 64   Resp: '15 15 19   '$ Temp:    98.5 F (36.9 C)  TempSrc:    Oral  SpO2: 100% (!) 80% 98%   Weight:      Height:        Intake/Output Summary (Last 24 hours) at 10/22/2022 1039 Last data filed at 10/22/2022 0052 Gross per 24 hour  Intake 1000.21 ml  Output --  Net 1000.21 ml      10/21/2022   11:40 PM 10/21/2022   10:47 PM 10/11/2022    8:54 AM  Last 3 Weights  Weight (lbs) 211 lb 3.2 oz 211 lb 6.4 oz 211 lb 6.4 oz  Weight (kg) 95.8 kg 95.89 kg 95.89 kg     Body mass index is 30.3 kg/m.  General:  Well nourished, well developed, in no acute distress HEENT: normal Neck: no JVD Vascular: No carotid bruits; Distal pulses 2+ bilaterally Cardiac:  normal S1, S2; RRR; no murmur  Lungs:  clear to auscultation bilaterally, no wheezing, rhonchi or rales  Abd: soft, nontender, no hepatomegaly  Ext: no edema Musculoskeletal:  No deformities, BUE and BLE strength normal and equal Skin: warm and dry  Neuro:  CNs  2-12 intact, no focal abnormalities noted Psych:  Normal affect   EKG:  The EKG was personally reviewed and demonstrates: Normal sinus rhythm, no significant ST-T wave changes. Telemetry:  Telemetry was personally reviewed and demonstrates: Sinus bradycardia, heart rate in the 50s to 60s.  No significant bradycardia or pauses  Relevant CV Studies:  Echo 11/17/2021  1. Left ventricular ejection fraction, by estimation, is 60 to 65%. The  left ventricle has normal function. The left ventricle has no regional  wall motion abnormalities. There is mild concentric left ventricular  hypertrophy. Left ventricular diastolic  parameters are consistent with Grade I diastolic dysfunction (impaired  relaxation).   2. Right ventricular systolic function is normal. The right ventricular  size is normal.   3. Left atrial size was mild to moderately dilated.   4. The mitral valve is normal in structure. Trivial mitral valve  regurgitation. No evidence of mitral stenosis.   5.  The aortic valve is tricuspid. Aortic valve regurgitation is not  visualized. No aortic stenosis is present.   6. Aortic dilatation noted. There is borderline dilatation of the  ascending aorta, measuring 36 mm.   7. The inferior vena cava is normal in size with greater than 50%  respiratory variability, suggesting right atrial pressure of 3 mmHg.   Laboratory Data:  High Sensitivity Troponin:   Recent Labs  Lab 10/04/22 1111 10/04/22 1410  TROPONINIHS 7 7     Chemistry Recent Labs  Lab 10/21/22 2147 10/22/22 0344  NA 135 134*  K 3.9 3.7  CL 102 103  CO2 26 25  GLUCOSE 119* 107*  BUN 17 13  CREATININE 1.22* 0.99  CALCIUM 8.4* 8.1*  GFRNONAA 45* 58*  ANIONGAP 7 6    Recent Labs  Lab 10/22/22 0344  PROT 6.2*  ALBUMIN 2.9*  AST 18  ALT 10  ALKPHOS 61  BILITOT 0.4   Lipids No results for input(s): "CHOL", "TRIG", "HDL", "LABVLDL", "LDLCALC", "CHOLHDL" in the last 168 hours.  Hematology Recent Labs  Lab 10/21/22 2147 10/22/22 0344  WBC 5.3 5.3  RBC 4.14 3.95  HGB 13.1 12.1  HCT 40.0 38.0  MCV 96.6 96.2  MCH 31.6 30.6  MCHC 32.8 31.8  RDW 13.3 13.3  PLT 151 143*   Thyroid  Recent Labs  Lab 10/22/22 0344 10/22/22 0542  TSH 0.303*  --   FREET4  --  1.04    BNP Recent Labs  Lab 10/21/22 2147  BNP 74.6    DDimer  Recent Labs  Lab 10/22/22 0344  DDIMER 3.82*     Radiology/Studies:  US THYROID  Result Date: 10/22/2022 CLINICAL DATA:  Abnormal thyroid function test EXAM: THYROID ULTRASOUND TECHNIQUE: Ultrasound examination of the thyroid gland and adjacent soft tissues was performed. COMPARISON:  None Available. FINDINGS: Parenchymal Echotexture: Mildly heterogenous Isthmus: 4 mm Right lobe: 3.3 x 1.8 x 1.7 cm Left lobe: 3.4 x 1.6 x 1.2 cm _________________________________________________________ Estimated total number of nodules >/= 1 cm: 0 Number of spongiform nodules >/=  2 cm not described below (TR1): 0 Number of mixed cystic and solid nodules  >/= 1.5 cm not described below (TR2): 0 _________________________________________________________ No discrete nodules are seen within the thyroid gland. IMPRESSION: Nonspecific thyroid heterogeneity. Negative for nodule. Overall normal thyroid ultrasound for age. The above is in keeping with the ACR TI-RADS recommendations - J Am Coll Radiol 2017;14:587-595. Electronically Signed   By: Jerilynn Mages.  Shick M.D.   On: 10/22/2022 10:32   CT Angio Chest Pulmonary  Embolism (PE) W or WO Contrast  Result Date: 10/22/2022 CLINICAL DATA:  Pulmonary embolism (PE) suspected, low to intermediate prob, neg D-dimer EXAM: CT ANGIOGRAPHY CHEST WITH CONTRAST TECHNIQUE: Multidetector CT imaging of the chest was performed using the standard protocol during bolus administration of intravenous contrast. Multiplanar CT image reconstructions and MIPs were obtained to evaluate the vascular anatomy. RADIATION DOSE REDUCTION: This exam was performed according to the departmental dose-optimization program which includes automated exposure control, adjustment of the mA and/or kV according to patient size and/or use of iterative reconstruction technique. CONTRAST:  13m OMNIPAQUE IOHEXOL 350 MG/ML SOLN COMPARISON:  None Available. FINDINGS: Cardiovascular: There is adequate opacification of the pulmonary arterial tree. No intraluminal filling defect identified to suggest acute pulmonary embolism. The central pulmonary arteries are enlarged in keeping with changes of pulmonary arterial hypertension. No significant coronary artery calcification. Global cardiac size is at the upper limits of normal. No pericardial effusion. Mild atherosclerotic calcification within the thoracic aorta. No aortic aneurysm. Mediastinum/Nodes: The visualized thyroid is unremarkable. No pathologic thoracic adenopathy. Esophagus unremarkable. Moderate hiatal hernia. Surgical staple line noted within the gastric cardia, unchanged. Lungs/Pleura: Bibasilar dependent atelectasis.  Lungs are otherwise clear. No pneumothorax or pleural effusion. No central obstructing lesion. Upper Abdomen: No acute abnormality. Ascending colonic diverticulosis noted. Musculoskeletal: No acute bone abnormality. No lytic or blastic bone lesion. Review of the MIP images confirms the above findings. IMPRESSION: 1. No pulmonary embolism. No acute intrathoracic pathology identified. 2. Morphologic changes in keeping with pulmonary arterial hypertension. 3. Moderate hiatal hernia. Aortic Atherosclerosis (ICD10-I70.0). Electronically Signed   By: AFidela SalisburyM.D.   On: 10/22/2022 03:51   DG Chest 2 View  Result Date: 10/21/2022 CLINICAL DATA:  Dyspnea EXAM: CHEST - 2 VIEW COMPARISON:  None Available. FINDINGS: Lungs are well expanded, symmetric, and clear. No pneumothorax or pleural effusion. Cardiac size is mildly enlarged. Pulmonary vascularity is normal. Osseous structures are age-appropriate. No acute bone abnormality. IMPRESSION: 1. Mild cardiomegaly. Electronically Signed   By: AFidela SalisburyM.D.   On: 10/21/2022 23:52     Assessment and Plan:   Recurrent dizziness and syncope: By patient report, syncope and severe dizziness appears to be more orthostatic in nature although orthostatic vital signs in the emergency room was negative.  Orthostatic vital sign: Lying BP 119/62 heart rate 74.  Sitting BP 105/93, heart rate 79.  Standing BP 109/62, heart rate 85.  Patient reported she has chronic low appetite, her appetite has been unchanged for COVID infection however drink at least 32 ounces of fluid per day.  Recommend increased salt intake.  Consider discontinuation of losartan and obtain ambulatory 24-hour blood pressure monitor at home.  Patient complains of a pulsatile mass in the right neck, on physical exam, it appears to be a enlarged common carotid artery, pending soft tissue neck CT, rule out carotid body tumor. Given frequent recurrence of dizziness and presyncope/syncope, may consider carotid  doppler to rule out steal syndrome.  Pending echocardiogram.  Intermittent chest discomfort: No additional evaluation.  Coronary CT in February 2023 showed normal coronary arteries with 0 calcium score.  ETT in August was normal.  Her chest pain is fairly atypical and may occur with or without exertion, exertion does not seems to exacerbate the pain.  Relative bradycardia: Avoid AV nodal blocking agent, heart rate has been in the low 50s in the emergency room, no significant bradycardia or pauses to explain her recent passing out spell.  She already had a heart monitor in  February 2023 which did not reveal significant arrhythmia or bradycardia, will defer to MD to see if she need another heart monitor.  DM2: Per primary team   Risk Assessment/Risk Scores:                For questions or updates, please contact [place] Please consult [email or web address] for contact info under    Hilbert Corrigan, [place]  10/22/2022 10:39 AM

## 2022-10-22 NOTE — Care Management Obs Status (Signed)
[place] NOTIFICATION   Patient Details  Name: [name] MRN: [id number] Date of Birth: 12/15/1940   Medicare Observation Status Notification Given:  Ernesta Amble, RN 10/22/2022, 3:49 PM

## 2022-10-22 NOTE — Discharge Summary (Signed)
Physician Discharge Summary   Patient: [name] MRN: [id number] DOB: 10-24-1940  Admit date:     10/21/2022  Discharge date: 10/22/22  Discharge Physician: Lorella Nimrod   PCP: Fanny Bien, MD   Recommendations at discharge:  Please obtain CBC and BMP in 1 week -Follow-up with primary care provider Follow-up with cardiology  Discharge Diagnoses: Principal Problem:   Syncope   Hospital Course: Take taken from H&P.   [name] is a 82 y.o. female with medical history significant of   hypertension, occasional PVC , asthma, anxiety, GERD, diabetes mellitus, fibromyalgia ,OSA on CPAP,Follows with cardiology for progressive doe and lower extremity edema with concern for heart failure. Of note  on last cardiology visit 12/28 , patient was seen an started on lasix for noted symptoms suggest of heart failure. She was place on lasix '40mg'$  po x 5 days with plans for repeat echo and f/u in 6 weeks.  Presented to ED with plaint of syncopal episode X 3, while trying to get out of bed.  Patient was recently diagnosed with COVID-19 just prior to Christmas, over the past couple of days had poor p.o. intake and noted to have a fever at home.  She notes no chest pain ,n/v/diaphoresis /abdominal pain / dysuria/ diarrhea associated with onset of these symptoms.  Similar episodes a year ago when she was given a Holter monitor and noted to have essentially normal rhythm with occasional PVCs, PACs and short runs of SVT with a mean heart rate of 63." Patient currently states she feels fatigued and notes ongoing DOE not resolved by recent 5 days lasix course that ended on  10/16/2022.     ED course.  Vital stable.  Labs mostly unremarkable.  Respiratory panel positive for COVID.  BNP normal at 74.6.  Chest x-ray without any acute abnormality and mild cardiomegaly. EKG with sinus rhythm.  Patient received total of 1 L of normal saline in ED.  Patient remained symptomatic with ambulation.  TRH was  consulted for admission. Echocardiogram was ordered.  Prior echocardiogram done in February 2023 was with normal EF, grade 1 diastolic dysfunction and no other significant abnormality except mildly dilated ascending aorta.  Repeat echocardiogram with similar findings and no new changes.  Cardiology evaluated her and there does not think that exertional dyspnea is due to cardiac reason, up.  Apparently patient had extensive cardiac workup this year and a calcium score of 0.  Cardiology cleared her for discharge and she will follow-up with her cardiologist as an outpatient for further recommendations.  Patient was also complaining of a right-sided supraclavicular lymph node, apparently increasing in size per patient she wants it get investigated, CT neck soft tissue was obtained and it did not show any discrete masses or lymph node.  Some concern of heterogenicity in thyroid with right lobe asymmetric.  Thyroid ultrasound was negative for any thyroid nodules.  Patient had exertional dyspnea and most likely some syncopal episode secondary to dehydration with poor p.o. intake and recent COVID.  Orthostatic vitals were negative.  Patient is clinically euvolemic and does not need more diuresis at this time.  She will continue with her home medications and follow-up with her providers closely for further recommendations.    Consultants: Cardiology Procedures performed: None Disposition: Home Diet recommendation:  Discharge Diet Orders (From admission, onward)     Start     Ordered   10/22/22 0000  Diet - low sodium heart healthy  10/22/22 1535           Cardiac diet DISCHARGE MEDICATION: Allergies as of 10/22/2022       Reactions   Adalat [nifedipine] Other (See Comments)   Tired, Made pt "feel crazy in the head"   Hydrocodone Itching, Nausea And Vomiting   Meperidine Hcl Other (See Comments)   Demerol causes hallucinations   Naproxen Nausea And Vomiting        Medication List      STOP taking these medications    furosemide 40 MG tablet Commonly known as: LASIX   hydrochlorothiazide 25 MG tablet Commonly known as: HYDRODIURIL   potassium chloride SA 20 MEQ tablet Commonly known as: KLOR-CON M       TAKE these medications    acetaminophen 500 MG tablet Commonly known as: TYLENOL Take 1,000 mg by mouth 2 (two) times daily as needed for headache (back and knee pain).   albuterol 108 (90 Base) MCG/ACT inhaler Commonly known as: VENTOLIN HFA Inhale 1 puff into the lungs as needed for wheezing or shortness of breath.   ascorbic acid 500 MG tablet Commonly known as: VITAMIN C Take 1 tablet (500 mg total) by mouth daily. Start taking on: October 23, 2022   brimonidine 0.2 % ophthalmic solution Commonly known as: ALPHAGAN 1 drop 2 (two) times daily.   cetirizine 10 MG tablet Commonly known as: ZYRTEC Take 10 mg by mouth at bedtime.   dexlansoprazole 60 MG capsule Commonly known as: DEXILANT Take 1 capsule (60 mg total) by mouth daily.   losartan 25 MG tablet Commonly known as: COZAAR Take 25 mg by mouth 2 (two) times daily.   montelukast 10 MG tablet Commonly known as: SINGULAIR Take 10 mg by mouth at bedtime.   nystatin ointment Commonly known as: MYCOSTATIN Apply 1 application  topically See admin instructions. 1 application 1-2 times a day as needed for rash What changed: Another medication with the same name was removed. Continue taking this medication, and follow the directions you see here.   rosuvastatin 40 MG tablet Commonly known as: CRESTOR Take 40 mg by mouth at bedtime.   sertraline 100 MG tablet Commonly known as: ZOLOFT Take 100 mg by mouth every morning.   sucralfate 1 g tablet Commonly known as: CARAFATE Take 1 tablet (1 g total) by mouth 4 (four) times daily -  with meals and at bedtime. What changed: when to take this   SYSTANE OP Place 1 drop into both eyes daily as needed (dry/irritated eyes).   Vitamin D3  250 MCG (10000 UT) capsule Take 10,000 Units by mouth daily.   zinc sulfate 220 (50 Zn) MG capsule Take 1 capsule (220 mg total) by mouth daily. Start taking on: October 23, 2022        Follow-up Information     Fanny Bien, MD. Schedule an appointment as soon as possible for a visit in 1 week(s).   Specialty: Family Medicine Contact information: [address] [place] [address] [phone]         Berniece Salines, DO. Schedule an appointment as soon as possible for a visit in 1 week(s).   Specialty: Cardiology Contact information: [address] [place] [address] [phone]                Discharge Exam: Danley Danker Weights   10/21/22 2247 10/21/22 2340  Weight: 95.9 kg 95.8 kg   General.     In no acute distress. Pulmonary.  Lungs  clear bilaterally, normal respiratory effort. CV.  Regular rate and rhythm, no JVD, rub or murmur. Abdomen.  Soft, nontender, nondistended, BS positive. CNS.  Alert and oriented .  No focal neurologic deficit. Extremities.  No edema, no cyanosis, pulses intact and symmetrical. Psychiatry.  Judgment and insight appears normal.   Condition at discharge: stable  The results of significant diagnostics from this hospitalization (including imaging, microbiology, ancillary and laboratory) are listed below for reference.   Imaging Studies: ECHOCARDIOGRAM COMPLETE  Result Date: 10/22/2022    ECHOCARDIOGRAM REPORT   Patient Name:   [name] Date of Exam: 10/22/2022 Medical Rec #:  [id number]   Height:       70.0 in Accession #:    [id number]  Weight:       211.2 lb Date of Birth:  1941/09/05  BSA:          2.136 m Patient Age:    67 years    BP:           136/65 mmHg Patient Gender: F           HR:           48 bpm. Exam Location:  Inpatient Procedure: 2D Echo, Cardiac Doppler and Color Doppler Indications:    R55 Syncope  History:        Patient has prior history of Echocardiogram examinations, most                  recent 11/17/2021. Abnormal ECG, Arrythmias:Bradycardia,                 Signs/Symptoms:Dyspnea and Shortness of Breath; Risk                 Factors:Dyslipidemia and Hypertension. Covid positive.  Sonographer:    Roseanna Rainbow RDCS Referring Phys: Clance Boll  Sonographer Comments: Suboptimal subcostal window. Patient is covid positive according to chart, however there is no contact precaution on the door. IMPRESSIONS  1. Left ventricular ejection fraction, by estimation, is 60 to 65%. The left ventricle has normal function. The left ventricle has no regional wall motion abnormalities. There is moderate left ventricular hypertrophy. Left ventricular diastolic parameters are consistent with Grade I diastolic dysfunction (impaired relaxation).  2. Right ventricular systolic function is normal. The right ventricular size is moderately enlarged. There is mildly elevated pulmonary artery systolic pressure.  3. Left atrial size was mildly dilated.  4. Right atrial size was moderately dilated.  5. The mitral valve is normal in structure. No evidence of mitral valve regurgitation. No evidence of mitral stenosis.  6. The aortic valve is normal in structure. There is mild calcification of the aortic valve. Aortic valve regurgitation is not visualized. Aortic valve sclerosis is present, with no evidence of aortic valve stenosis.  7. The inferior vena cava is normal in size with greater than 50% respiratory variability, suggesting right atrial pressure of 3 mmHg. Comparison(s): No significant change from prior study. Prior images reviewed side by side. FINDINGS  Left Ventricle: Left ventricular ejection fraction, by estimation, is 60 to 65%. The left ventricle has normal function. The left ventricle has no regional wall motion abnormalities. The left ventricular internal cavity size was normal in size. There is  moderate left ventricular hypertrophy. Left ventricular diastolic parameters are consistent with Grade I diastolic  dysfunction (impaired relaxation). Right Ventricle: The right ventricular size is moderately enlarged. No increase in right ventricular wall thickness. Right ventricular systolic function is normal. There is  mildly elevated pulmonary artery systolic pressure. The tricuspid regurgitant velocity is 2.67 m/s, and with an assumed right atrial pressure of 8 mmHg, the estimated right ventricular systolic pressure is 61.9 mmHg. Left Atrium: Left atrial size was mildly dilated. Right Atrium: Right atrial size was moderately dilated. Pericardium: There is no evidence of pericardial effusion. Mitral Valve: The mitral valve is normal in structure. Mild mitral annular calcification. No evidence of mitral valve regurgitation. No evidence of mitral valve stenosis. Tricuspid Valve: The tricuspid valve is normal in structure. Tricuspid valve regurgitation is not demonstrated. No evidence of tricuspid stenosis. Aortic Valve: The aortic valve is normal in structure. There is mild calcification of the aortic valve. Aortic valve regurgitation is not visualized. Aortic valve sclerosis is present, with no evidence of aortic valve stenosis. Pulmonic Valve: The pulmonic valve was normal in structure. Pulmonic valve regurgitation is not visualized. No evidence of pulmonic stenosis. Aorta: The aortic root is normal in size and structure. Venous: The inferior vena cava is normal in size with greater than 50% respiratory variability, suggesting right atrial pressure of 3 mmHg. IAS/Shunts: No atrial level shunt detected by color flow Doppler.  LEFT VENTRICLE PLAX 2D LVIDd:         3.60 cm      Diastology LVIDs:         2.50 cm      LV e' medial:    7.94 cm/s LV PW:         1.80 cm      LV E/e' medial:  10.3 LV IVS:        1.40 cm      LV e' lateral:   9.03 cm/s LVOT diam:     2.60 cm      LV E/e' lateral: 9.1 LV SV:         131 LV SV Index:   61 LVOT Area:     5.31 cm  LV Volumes (MOD) LV vol d, MOD A2C: 109.0 ml LV vol d, MOD A4C: 78.9 ml LV  vol s, MOD A2C: 39.3 ml LV vol s, MOD A4C: 39.0 ml LV SV MOD A2C:     69.7 ml LV SV MOD A4C:     78.9 ml LV SV MOD BP:      52.4 ml RIGHT VENTRICLE             IVC RV S prime:     12.10 cm/s  IVC diam: 2.10 cm TAPSE (M-mode): 2.5 cm LEFT ATRIUM              Index        RIGHT ATRIUM           Index LA diam:        5.15 cm  2.41 cm/m   RA Area:     25.60 cm LA Vol (A2C):   113.0 ml 52.90 ml/m  RA Volume:   92.40 ml  43.25 ml/m LA Vol (A4C):   56.2 ml  26.31 ml/m LA Biplane Vol: 79.8 ml  37.36 ml/m  AORTIC VALVE LVOT Vmax:   105.00 cm/s LVOT Vmean:  69.900 cm/s LVOT VTI:    0.247 m  AORTA Ao Root diam: 3.40 cm Ao Asc diam:  3.75 cm MITRAL VALVE               TRICUSPID VALVE MV Area (PHT): 3.03 cm    TR Peak grad:   28.5 mmHg MV Decel Time: 250 msec  TR Vmax:        267.00 cm/s MV E velocity: 81.80 cm/s MV A velocity: 79.90 cm/s  SHUNTS MV E/A ratio:  1.02        Systemic VTI:  0.25 m                            Systemic Diam: 2.60 cm Candee Furbish MD Electronically signed by Candee Furbish MD Signature Date/Time: 10/22/2022/2:38:04 PM    Final    CT SOFT TISSUE NECK W CONTRAST  Result Date: 10/22/2022 CLINICAL DATA:  Neck mass. EXAM: CT NECK WITH CONTRAST TECHNIQUE: Multidetector CT imaging of the neck was performed using the standard protocol following the bolus administration of intravenous contrast. RADIATION DOSE REDUCTION: This exam was performed according to the departmental dose-optimization program which includes automated exposure control, adjustment of the mA and/or kV according to patient size and/or use of iterative reconstruction technique. CONTRAST:  87m OMNIPAQUE IOHEXOL 350 MG/ML SOLN COMPARISON:  None Available. FINDINGS: Pharynx and larynx: Normal. No mass or swelling. Salivary glands: No inflammation, mass, or stone. Thyroid: The right thyroid lobe asymmetrically more prominent within the left without a discrete thyroid nodule. Lymph nodes: None enlarged or abnormal density. 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 The right vertebral artery is likely occluded at the origin, age indeterminate. Fetal PCA on the left. The distal P2 and P3 segments of the left PCA are asymmetrically small in caliber than the right. Limited intracranial: Negative. Visualized orbits: Bilateral replacement. Orbits are otherwise unremarkable. Mastoids and visualized paranasal sinuses: Unchanged appearance of the mastoid air cells on left. No mastoid or middle ear effusion. Paranasal sinuses are clear. Skeleton: No acute or aggressive process. Upper chest: Please see same-day CT chest for additional findings. Other: None IMPRESSION: 1. No finding to explain right neck fullness. Slightly asymmetric prominence of the right thyroid lobe without a discrete underlying thyroid nodule. 2. 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 3. 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 The distal P2 and P3 segments of the left PCA are asymmetrically small in caliber than the right. 1. Please see same-day CT chest for additional findings. Electronically Signed   By: HMarin RobertsM.D.   On: 10/22/2022 12:09   [place]  Result Date: 10/22/2022 CLINICAL DATA:  Abnormal thyroid function test EXAM: THYROID ULTRASOUND TECHNIQUE: Ultrasound examination of the thyroid gland and adjacent soft tissues was performed. COMPARISON:  None Available. FINDINGS: Parenchymal Echotexture: Mildly heterogenous Isthmus: 4 mm Right lobe: 3.3 x 1.8 x 1.7 cm Left lobe: 3.4 x 1.6 x 1.2 cm _________________________________________________________ Estimated total number of nodules >/= 1 cm: 0 Number of spongiform nodules >/=  2 cm not described below (TR1): 0 Number of mixed cystic and solid nodules >/= 1.5 cm not described below (TR2): 0 _________________________________________________________ No discrete  nodules are seen within the thyroid gland. IMPRESSION: Nonspecific thyroid heterogeneity. Negative for nodule. Overall normal thyroid ultrasound for age. The above is in keeping with the ACR TI-RADS recommendations - J Am Coll Radiol 2017;14:587-595. Electronically Signed   By: MJerilynn Mages  Shick M.D.   On: 10/22/2022 10:32   CT Angio Chest Pulmonary Embolism (PE) W or WO Contrast  Result Date: 10/22/2022 CLINICAL DATA:  Pulmonary embolism (PE) suspected, low to intermediate prob,  neg D-dimer EXAM: CT ANGIOGRAPHY CHEST WITH CONTRAST TECHNIQUE: Multidetector CT imaging of the chest was performed using the standard protocol during bolus administration of intravenous contrast. Multiplanar CT image reconstructions and MIPs were obtained to evaluate the vascular anatomy. RADIATION DOSE REDUCTION: This exam was performed according to the departmental dose-optimization program which includes automated exposure control, adjustment of the mA and/or kV according to patient size and/or use of iterative reconstruction technique. CONTRAST:  69m OMNIPAQUE IOHEXOL 350 MG/ML SOLN COMPARISON:  None Available. FINDINGS: Cardiovascular: There is adequate opacification of the pulmonary arterial tree. No intraluminal filling defect identified to suggest acute pulmonary embolism. The central pulmonary arteries are enlarged in keeping with changes of pulmonary arterial hypertension. No significant coronary artery calcification. Global cardiac size is at the upper limits of normal. No pericardial effusion. Mild atherosclerotic calcification within the thoracic aorta. No aortic aneurysm. Mediastinum/Nodes: The visualized thyroid is unremarkable. No pathologic thoracic adenopathy. Esophagus unremarkable. Moderate hiatal hernia. Surgical staple line noted within the gastric cardia, unchanged. Lungs/Pleura: Bibasilar dependent atelectasis. Lungs are otherwise clear. No pneumothorax or pleural effusion. No central obstructing lesion. Upper Abdomen:  No acute abnormality. Ascending colonic diverticulosis noted. Musculoskeletal: No acute bone abnormality. No lytic or blastic bone lesion. Review of the MIP images confirms the above findings. IMPRESSION: 1. No pulmonary embolism. No acute intrathoracic pathology identified. 2. Morphologic changes in keeping with pulmonary arterial hypertension. 3. Moderate hiatal hernia. Aortic Atherosclerosis (ICD10-I70.0). Electronically Signed   By: AFidela SalisburyM.D.   On: 10/22/2022 03:51   DG Chest 2 View  Result Date: 10/21/2022 CLINICAL DATA:  Dyspnea EXAM: CHEST - 2 VIEW COMPARISON:  None Available. FINDINGS: Lungs are well expanded, symmetric, and clear. No pneumothorax or pleural effusion. Cardiac size is mildly enlarged. Pulmonary vascularity is normal. Osseous structures are age-appropriate. No acute bone abnormality. IMPRESSION: 1. Mild cardiomegaly. Electronically Signed   By: AFidela SalisburyM.D.   On: 10/21/2022 23:52   CT Angio Chest PE W/Cm &/Or Wo Cm  Result Date: 10/04/2022 CLINICAL DATA:  Chest tightness and shortness of breath for 1 day EXAM: CT ANGIOGRAPHY CHEST WITH CONTRAST TECHNIQUE: Multidetector CT imaging of the chest was performed using the standard protocol during bolus administration of intravenous contrast. Multiplanar CT image reconstructions and MIPs were obtained to evaluate the vascular anatomy. RADIATION DOSE REDUCTION: This exam was performed according to the departmental dose-optimization program which includes automated exposure control, adjustment of the mA and/or kV according to patient size and/or use of iterative reconstruction technique. CONTRAST:  830mOMNIPAQUE IOHEXOL 350 MG/ML SOLN COMPARISON:  Same day chest radiograph, CTA chest 05/21/2022 FINDINGS: Cardiovascular: There is adequate opacification of the pulmonary arteries to the subsegmental level. There is no evidence of pulmonary embolism. The heart is enlarged with reflux of contrast into the IVC/hepatic veins. There  is no pericardial effusion. The main pulmonary artery is borderline enlarged measuring up to 3.0 cm. There is minimal calcified plaque in the aortic arch. Mediastinum/Nodes: The thyroid is unremarkable. The esophagus is grossly unremarkable. There is small to moderate-sized hiatal hernia, unchanged there is no mediastinal, hilar, or axillary lymphadenopathy. Lungs/Pleura: The trachea and central airways are patent. Lung volumes are low. There is interlobular septal thickening and mosaic attenuation in the lung bases. There is no focal consolidation. There is no pleural effusion or pneumothorax There are no suspicious nodules. Upper Abdomen: The imaged portions of the upper abdominal viscera are unremarkable. Musculoskeletal: There is no acute osseous abnormality or suspicious osseous lesion. Review of the MIP  images confirms the above findings. IMPRESSION: 1. No evidence of pulmonary embolism. 2. Suspect mild pulmonary interstitial edema. 3. Borderline enlarged main pulmonary artery suggesting pulmonary hypertension. Additionally, there is cardiomegaly with reflux of contrast into the IVC/hepatic veins suggesting right heart dysfunction. 4. Unchanged hiatal hernia. Electronically Signed   By: Valetta Mole M.D.   On: 10/04/2022 14:03   DG Chest 2 View  Result Date: 10/04/2022 CLINICAL DATA:  Chest pain, cough EXAM: CHEST - 2 VIEW COMPARISON:  05/21/2022 FINDINGS: The heart size and mediastinal contours are within normal limits. Both lungs are clear. Dextroscoliosis is seen in thoracic spine. IMPRESSION: No active cardiopulmonary disease. Electronically Signed   By: Elmer Picker M.D.   On: 10/04/2022 11:42   MR HIP RIGHT WO CONTRAST  Result Date: 10/02/2022 CLINICAL DATA:  Right hip pain without improvement from steroid therapy over several months. No known injury. EXAM: MR OF THE RIGHT HIP WITHOUT CONTRAST TECHNIQUE: Multiplanar, multisequence MR imaging was performed. No intravenous contrast was  administered. COMPARISON:  Radiographs 08/29/2022.  Abdominopelvic CT 10/26/2021. FINDINGS: Bones: There is no evidence of acute fracture, dislocation or femoral head osteonecrosis. The visualized bony pelvis appears normal. Mild sacroiliac degenerative changes and mild lower lumbar spondylosis noted. Articular cartilage and labrum Articular cartilage: Mild degenerative changes of both hips with stable subchondral cyst formation in the right acetabulum. Labrum: Right acetabular labral degeneration anteriorly without gross labral tear or paralabral cyst. Joint or bursal effusion Joint effusion: No significant hip joint effusion. Bursae: No focal periarticular fluid collection. Muscles and tendons Muscles and tendons: The visualized gluteus, hamstring and iliopsoas tendons appear normal. Chronic fatty atrophy of the gluteus minimus muscles bilaterally. The piriformis muscles appear symmetric. Other findings Miscellaneous: The visualized internal pelvic contents appear unremarkable. IMPRESSION: 1. No acute findings or explanation for the patient's symptoms. 2. Mild degenerative changes of both hips with stable subchondral cyst formation in the right acetabulum. 3. Chronic fatty atrophy of the gluteus minimus muscles bilaterally. Electronically Signed   By: Richardean Sale M.D.   On: 10/02/2022 11:18    Microbiology: Results for orders placed or performed during the hospital encounter of 10/21/22  Resp panel by RT-PCR (RSV, Flu A&B, Covid) Anterior Nasal Swab     Status: Abnormal   Collection Time: 10/21/22 11:03 PM   Specimen: Anterior Nasal Swab  Result Value Ref Range Status   SARS Coronavirus 2 by RT PCR POSITIVE (A) NEGATIVE Final    Comment: (NOTE) SARS-CoV-2 target nucleic acids are DETECTED.  The SARS-CoV-2 RNA is generally detectable in upper respiratory specimens during the acute phase of infection. Positive results are indicative of the presence of the identified virus, but do not rule out  bacterial infection or co-infection with other pathogens not detected by the test. Clinical correlation with patient history and other diagnostic information is necessary to determine patient infection status. The expected result is Negative.  Fact Sheet for Patients: EntrepreneurPulse.com.au  Fact Sheet for Healthcare Providers: IncredibleEmployment.be  This test is not yet approved or cleared by the [place] FDA and  has been authorized for detection and/or diagnosis of SARS-CoV-2 by FDA under an Emergency Use Authorization (EUA).  This EUA will remain in effect (meaning this test can be used) for the duration of  the COVID-19 declaration under Section 564(b)(1) of the A ct, 21 U.S.C. section 360bbb-3(b)(1), unless the authorization is terminated or revoked sooner.     Influenza A by PCR NEGATIVE NEGATIVE Final   Influenza B by PCR NEGATIVE  NEGATIVE Final    Comment: (NOTE) The Xpert Xpress SARS-CoV-2/FLU/RSV plus assay is intended as an aid in the diagnosis of influenza from Nasopharyngeal swab specimens and should not be used as a sole basis for treatment. Nasal washings and aspirates are unacceptable for Xpert Xpress SARS-CoV-2/FLU/RSV testing.  Fact Sheet for Patients: EntrepreneurPulse.com.au  Fact Sheet for Healthcare Providers: IncredibleEmployment.be  This test is not yet approved or cleared by the [place] FDA and has been authorized for detection and/or diagnosis of SARS-CoV-2 by FDA under an Emergency Use Authorization (EUA). This EUA will remain in effect (meaning this test can be used) for the duration of the COVID-19 declaration under Section 564(b)(1) of the Act, 21 U.S.C. section 360bbb-3(b)(1), unless the authorization is terminated or revoked.     Resp Syncytial Virus by PCR NEGATIVE NEGATIVE Final    Comment: (NOTE) Fact Sheet for  Patients: EntrepreneurPulse.com.au  Fact Sheet for Healthcare Providers: IncredibleEmployment.be  This test is not yet approved or cleared by the [place] FDA and has been authorized for detection and/or diagnosis of SARS-CoV-2 by FDA under an Emergency Use Authorization (EUA). This EUA will remain in effect (meaning this test can be used) for the duration of the COVID-19 declaration under Section 564(b)(1) of the Act, 21 U.S.C. section 360bbb-3(b)(1), unless the authorization is terminated or revoked.  Performed at [facility], [place] [address] [place], [place] [address]     Labs: CBC: Recent Labs  Lab 10/21/22 2147 10/22/22 0344  WBC 5.3 5.3  NEUTROABS 3.6  --   HGB 13.1 12.1  HCT 40.0 38.0  MCV 96.6 96.2  PLT 151 505*   Basic Metabolic Panel: Recent Labs  Lab 10/21/22 2147 10/22/22 0344  NA 135 134*  K 3.9 3.7  CL 102 103  CO2 26 25  GLUCOSE 119* 107*  BUN 17 13  CREATININE 1.22* 0.99  CALCIUM 8.4* 8.1*   Liver Function Tests: Recent Labs  Lab 10/22/22 0344  AST 18  ALT 10  ALKPHOS 61  BILITOT 0.4  PROT 6.2*  ALBUMIN 2.9*   CBG: Recent Labs  Lab 10/22/22 0436  GLUCAP 111*    Discharge time spent: greater than 30 minutes.  This record has been created using Systems analyst. Errors have been sought and corrected,but may not always be located. Such creation errors do not reflect on the standard of care.   Signed: Lorella Nimrod, MD [facility] 10/22/2022

## 2022-10-22 NOTE — ED Provider Notes (Signed)
Patient reports having a fever earlier in the day, feeling lightheaded and had syncopal events at home.  No traumatic injuries.  She has been given a liter of fluid here with mild improvement.  However she still felt symptomatic upon ambulation.  She has been managed as an outpatient by cardiology.  She is supposed to have an echocardiogram later this month.  Patient still feeling symptomatic, will admit for observation.  Patient will be maintained on the monitor.  Discussed with Dr. Marcello Moores with [facility] for admission.  Patient would benefit from echocardiogram in hospital  Of note, patient is positive for COVID-19, but reports she had a case of this last month around Christmas.   Ripley Fraise, MD 10/22/22 [phone]

## 2022-10-22 NOTE — ED Provider Notes (Signed)
[facility] EMERGENCY DEPARTMENT Provider Note   CSN: [id number] Arrival date & time: 10/21/22  2137     History  Chief Complaint  Patient presents with   Loss of Consciousness    [name] is a 82 y.o. female.  HPI   Patient with medical history including GERD, hypertension, orthostatic hypotension presents  with complaints of dizziness and syncope.  Patient states last few weeks she has been having near syncope episodes, states that typically when she goes from a sitting to sitting position she will start to feel lightheaded dizzy and her vision will go dark, and she will come close to passing out.  She states that yesterday the symptoms had gotten worse, happened about 3 separate times, she did not actually syncopize.  She states when this does happen she denies any chest pain, shortness of breath, she states she will feel slightly diaphoretic.  She has no history of PEs or DVTs she is currently not on oral birth control, there is been no recent long immobilization, no recent surgeries.  Patient states that over the last week or so she has not been eating or drinking very much as she was diagnosed with COVID, she states that she has been compliant with her blood pressure medication, she took 1 early in the morning yesterday but did not take her second dose.  She does endorse that she has had some fevers and chills starting yesterday, and as well as some diarrhea, no cough no congestion no stomach pains no nausea or vomiting.  I have reviewed patient's chart she has been seen by cardiology, she had a normal echocardiogram about 6 months ago, she also had a calcium score of 0, she initially was on lisinopril as well as use CTZ, she was taken off HCTZ due to hypotension, and was displaced on lisinopril, recently her lisinopril was cut in half and now she only takes 25 mg twice daily.    Home Medications Prior to Admission medications   Medication Sig Start Date End Date  Taking? Authorizing Provider  acetaminophen (TYLENOL) 500 MG tablet Take 1,000 mg by mouth 2 (two) times daily as needed for headache (back and knee pain).   Yes [provider]  albuterol (VENTOLIN HFA) 108 (90 Base) MCG/ACT inhaler Inhale 1 puff into the lungs as needed for wheezing or shortness of breath. 01/29/22  Yes [provider]  brimonidine (ALPHAGAN) 0.2 % ophthalmic solution 1 drop 2 (two) times daily. 09/26/22  Yes [provider]  cetirizine (ZYRTEC) 10 MG tablet Take 10 mg by mouth at bedtime.   Yes [provider]  Cholecalciferol (VITAMIN D3) 250 MCG (10000 UT) capsule Take 10,000 Units by mouth daily.   Yes [provider]  dexlansoprazole (DEXILANT) 60 MG capsule Take 1 capsule (60 mg total) by mouth daily. 11/13/21  Yes Esterwood, Amy S, PA-C  losartan (COZAAR) 25 MG tablet Take 25 mg by mouth 2 (two) times daily. 08/29/22  Yes [provider]  montelukast (SINGULAIR) 10 MG tablet Take 10 mg by mouth at bedtime.   Yes [provider]  nystatin (MYCOSTATIN/NYSTOP) powder Apply 1 Application topically daily as needed (for rash). 11/20/16  Yes [provider]  nystatin ointment (MYCOSTATIN) Apply 1 application  topically See admin instructions. 1 application 1-2 times a day as needed for rash 11/20/16  Yes [provider]  Polyethyl Glycol-Propyl Glycol (SYSTANE OP) Place 1 drop into both eyes daily as needed (dry/irritated eyes).  Yes [provider]  rosuvastatin (CRESTOR) 40 MG tablet Take 40 mg by mouth at bedtime.   Yes [provider]  sertraline (ZOLOFT) 100 MG tablet Take 100 mg by mouth every morning. 03/29/22  Yes [provider]  sucralfate (CARAFATE) 1 g tablet Take 1 tablet (1 g total) by mouth 4 (four) times daily -  with meals and at bedtime. Patient taking differently: Take 1 g by mouth at bedtime. 11/13/21  Yes Esterwood, Amy S, PA-C  furosemide (LASIX) 40 MG tablet  Take 1 tablet (40 mg total) by mouth daily. Patient not taking: Reported on 10/22/2022 10/11/22   Tobb, Kardie, DO  hydrochlorothiazide (HYDRODIURIL) 25 MG tablet Take 25 mg by mouth every morning. Patient not taking: Reported on 10/22/2022 06/18/22   [provider]  potassium chloride SA (KLOR-CON M) 20 MEQ tablet Take 1 tablet (20 mEq total) by mouth daily. Patient not taking: Reported on 10/22/2022 10/11/22   Tobb, Godfrey Pick, DO      Allergies    Adalat [nifedipine], Hydrocodone, Meperidine hcl, and Naproxen    Review of Systems   Review of Systems  Constitutional:  Negative for chills and fever.  Respiratory:  Negative for shortness of breath.   Cardiovascular:  Negative for chest pain.  Gastrointestinal:  Negative for abdominal pain.  Neurological:  Positive for syncope. Negative for headaches.    Physical Exam Updated Vital Signs BP (!) 127/90   Pulse (!) 57   Temp 99 F (37.2 C) (Oral)   Resp 15   Ht '5\' 10"'$  (1.778 m)   Wt 95.8 kg   SpO2 100%   BMI 30.30 kg/m  Physical Exam Vitals and nursing note reviewed.  Constitutional:      General: She is not in acute distress.    Appearance: She is not ill-appearing.  HENT:     Head: Normocephalic and atraumatic.     Comments: No deformity of the head present no raccoon eyes or Battle sign noted.    Nose: No congestion.     Mouth/Throat:     Mouth: Mucous membranes are moist.     Pharynx: Oropharynx is clear. No oropharyngeal exudate or posterior oropharyngeal erythema.     Comments: No trismus no torticollis no oral trauma present, especially tongue biting. Eyes:     Conjunctiva/sclera: Conjunctivae normal.  Cardiovascular:     Rate and Rhythm: Normal rate and regular rhythm.     Pulses: Normal pulses.     Heart sounds: No murmur heard.    No friction rub. No gallop.  Pulmonary:     Effort: No respiratory distress.     Breath sounds: No wheezing, rhonchi or rales.  Abdominal:     Palpations: Abdomen is soft.      Tenderness: There is no abdominal tenderness. There is no right CVA tenderness or left CVA tenderness.  Musculoskeletal:     Right lower leg: No edema.     Left lower leg: No edema.     Comments: No evidence of peripheral edema no unilateral leg swelling no calf tenderness  Skin:    General: Skin is warm and dry.  Neurological:     Mental Status: She is alert.     GCS: GCS eye subscore is 4. GCS verbal subscore is 5. GCS motor subscore is 6.     Cranial Nerves: No cranial nerve deficit.     Sensory: Sensation is intact.     Motor: No weakness.     Coordination: Romberg  sign negative. Finger-Nose-Finger Test normal.     Comments: Cranial nerves II through XII are grossly intact no difficulty with word finding following two-step commands there is no unilateral weakness present.  Psychiatric:        Mood and Affect: Mood normal.     ED Results / Procedures / Treatments   Labs (all labs ordered are listed, but only abnormal results are displayed) Labs Reviewed  RESP PANEL BY RT-PCR (RSV, FLU A&B, COVID)  [id number] - Abnormal; Notable for the following components:      Result Value   SARS Coronavirus 2 by RT PCR POSITIVE (*)    All other components within normal limits  BASIC METABOLIC PANEL - Abnormal; Notable for the following components:   Glucose, Bld 119 (*)    Creatinine, Ser 1.22 (*)    Calcium 8.4 (*)    GFR, Estimated 45 (*)    All other components within normal limits  D-DIMER, QUANTITATIVE - Abnormal; Notable for the following components:   D-Dimer, Quant 3.82 (*)    All other components within normal limits  CBC - Abnormal; Notable for the following components:   Platelets 143 (*)    All other components within normal limits  TSH - Abnormal; Notable for the following components:   TSH 0.303 (*)    All other components within normal limits  COMPREHENSIVE METABOLIC PANEL - Abnormal; Notable for the following components:   Sodium 134 (*)    Glucose, Bld 107 (*)     Calcium 8.1 (*)    Total Protein 6.2 (*)    Albumin 2.9 (*)    GFR, Estimated 58 (*)    All other components within normal limits  CBG MONITORING, ED - Abnormal; Notable for the following components:   Glucose-Capillary 111 (*)    All other components within normal limits  CBC WITH DIFFERENTIAL/PLATELET  BRAIN NATRIURETIC PEPTIDE  URINALYSIS, ROUTINE W REFLEX MICROSCOPIC  PROTIME-INR  PROCALCITONIN  HEMOGLOBIN A1C  T3  T4, FREE    EKG EKG Interpretation  Date/Time:  Sunday October 21 2022 21:47:22 EST Ventricular Rate:  73 PR Interval:  186 QRS Duration: 99 QT Interval:  379 QTC Calculation: 418 R Axis:   5 Text Interpretation: Sinus rhythm Left ventricular hypertrophy overall similar to Oct 04 2022 Confirmed by Sherwood Gambler [phone]) on 10/21/2022 10:05:58 PM  Radiology CT Angio Chest Pulmonary Embolism (PE) W or WO Contrast  Result Date: 10/22/2022 CLINICAL DATA:  Pulmonary embolism (PE) suspected, low to intermediate prob, neg D-dimer EXAM: CT ANGIOGRAPHY CHEST WITH CONTRAST TECHNIQUE: Multidetector CT imaging of the chest was performed using the standard protocol during bolus administration of intravenous contrast. Multiplanar CT image reconstructions and MIPs were obtained to evaluate the vascular anatomy. RADIATION DOSE REDUCTION: This exam was performed according to the departmental dose-optimization program which includes automated exposure control, adjustment of the mA and/or kV according to patient size and/or use of iterative reconstruction technique. CONTRAST:  26m OMNIPAQUE IOHEXOL 350 MG/ML SOLN COMPARISON:  None Available. FINDINGS: Cardiovascular: There is adequate opacification of the pulmonary arterial tree. No intraluminal filling defect identified to suggest acute pulmonary embolism. The central pulmonary arteries are enlarged in keeping with changes of pulmonary arterial hypertension. No significant coronary artery calcification. Global cardiac size is at the  upper limits of normal. No pericardial effusion. Mild atherosclerotic calcification within the thoracic aorta. No aortic aneurysm. Mediastinum/Nodes: The visualized thyroid is unremarkable. No pathologic thoracic adenopathy. Esophagus unremarkable. Moderate hiatal hernia. Surgical staple line noted within  the gastric cardia, unchanged. Lungs/Pleura: Bibasilar dependent atelectasis. Lungs are otherwise clear. No pneumothorax or pleural effusion. No central obstructing lesion. Upper Abdomen: No acute abnormality. Ascending colonic diverticulosis noted. Musculoskeletal: No acute bone abnormality. No lytic or blastic bone lesion. Review of the MIP images confirms the above findings. IMPRESSION: 1. No pulmonary embolism. No acute intrathoracic pathology identified. 2. Morphologic changes in keeping with pulmonary arterial hypertension. 3. Moderate hiatal hernia. Aortic Atherosclerosis (ICD10-I70.0). Electronically Signed   By: Fidela Salisbury M.D.   On: 10/22/2022 03:51   DG Chest 2 View  Result Date: 10/21/2022 CLINICAL DATA:  Dyspnea EXAM: CHEST - 2 VIEW COMPARISON:  None Available. FINDINGS: Lungs are well expanded, symmetric, and clear. No pneumothorax or pleural effusion. Cardiac size is mildly enlarged. Pulmonary vascularity is normal. Osseous structures are age-appropriate. No acute bone abnormality. IMPRESSION: 1. Mild cardiomegaly. Electronically Signed   By: Fidela Salisbury M.D.   On: 10/21/2022 23:52    Procedures Procedures    Medications Ordered in ED Medications  acetaminophen (TYLENOL) tablet 1,000 mg (1,000 mg Oral Given 10/22/22 0241)  rosuvastatin (CRESTOR) tablet 40 mg (has no administration in time range)  sertraline (ZOLOFT) tablet 100 mg (has no administration in time range)  pantoprazole (PROTONIX) EC tablet 40 mg (has no administration in time range)  sucralfate (CARAFATE) tablet 1 g (has no administration in time range)  cholecalciferol (VITAMIN D3) 25 MCG (1000 UNIT) tablet 10,000  Units (has no administration in time range)  albuterol (PROVENTIL) (2.5 MG/3ML) 0.083% nebulizer solution 3 mL (has no administration in time range)  loratadine (CLARITIN) tablet 10 mg (has no administration in time range)  montelukast (SINGULAIR) tablet 10 mg (has no administration in time range)  polyethylene glycol 0.4% and propylene glycol 0.3% (SYSTANE) ophthalmic gel (has no administration in time range)  sodium chloride flush (NS) 0.9 % injection 3 mL (3 mLs Intravenous Given 10/22/22 0259)  heparin injection 5,000 Units (5,000 Units Subcutaneous Given 10/22/22 0520)  0.9 %  sodium chloride infusion ( Intravenous New Bag/Given 10/22/22 0406)  nirmatrelvir/ritonavir (PAXLOVID) 3 tablet (3 tablets Oral Given 10/22/22 0521)  ascorbic acid (VITAMIN C) tablet 500 mg (has no administration in time range)  zinc sulfate capsule 220 mg (has no administration in time range)  ondansetron (ZOFRAN) tablet 4 mg (has no administration in time range)    Or  ondansetron (ZOFRAN) injection 4 mg (has no administration in time range)  sodium chloride 0.9 % bolus 500 mL (0 mLs Intravenous Stopped 10/22/22 0014)  sodium chloride 0.9 % bolus 500 mL (0 mLs Intravenous Stopped 10/22/22 0052)  iohexol (OMNIPAQUE) 350 MG/ML injection 50 mL (50 mLs Intravenous Contrast Given 10/22/22 2585)    ED Course/ Medical Decision Making/ A&P                           Medical Decision Making Amount and/or Complexity of Data Reviewed Labs: ordered. Radiology: ordered.  Risk Decision regarding hospitalization.   This patient presents to the ED for concern of syncope, this involves an extensive number of treatment options, and is a complaint that carries with it a high risk of complications and morbidity.  The differential diagnosis includes seizures, ACS, PE, arrhythmias, CVA, CHF    Additional history obtained:  Additional history obtained from daughter at bedside External records from outside source obtained and reviewed  including cards notes   Co morbidities that complicate the patient evaluation  Diastolic heart failure,  Social Determinants of  Health:  N/A    Lab Tests:  I Ordered, and personally interpreted labs.  The pertinent results include: CBC is unremarkable, CMP shows glucose of 107, calcium 8.1, total protein 6.2, albumin 2.9, GFR 58,   Imaging Studies ordered:  I ordered imaging studies including chest x-ray I independently visualized and interpreted imaging which showed without signs of acute findings I agree with the radiologist interpretation   Cardiac Monitoring:  The patient was maintained on a cardiac monitor.  I personally viewed and interpreted the cardiac monitored which showed an underlying rhythm of: Without signs of ischemia   Medicines ordered and prescription drug management:  I ordered medication including fluid's I have reviewed the patients home medicines and have made adjustments as needed  Critical Interventions:  N/A   Reevaluation:  Presents with near syncope, on my initial assessment vital signs are reassuring, she is resting comfortably, she had benign physical exam, my suspicion is likely orthostatic hypotension exacerbated by poor oral intake, will provide her with a liter of fluids, obtain orthostatics and reassess  Patient patient did have a slight drop in her BP from going from lying down to sitting, will reassess after liter of fluids.  Consultations Obtained:  N/A    Test Considered:  CT head-this to be deferred my [facility] suspicion for intracranial bleed/cranial mass is low no recent head trauma, not on anticoag's.  Patient has had a CT head 1 year ago which was negative for evidence of a mass.    Rule out  Low suspicion for CVA she has no focal deficit present on my exam.  Low suspicion for dissection of the vertebral or carotid artery as presentation atypical of etiology.  Suspicion for seizures are also low at this time as  presentation is atypical there is no postictal state, there is no tongue biting no urinary incontinence, near syncope is provoked by position which which is inconsistent with seizure-like activity.  I doubt CHF exacerbation as she does not appear to be volume overloaded on my exam, chest x-ray reveals no pleural effusions no rales present on exam.  I doubt PE as she denies pleuritic chest pain shortness of breath, there is no unilateral leg swelling, she is nontachypneic nonhypoxic presentation is atypical.  I doubt ACS patient does not endorsing any chest pain, patient has low risk factors she has no ischemic events, EKG without signs of ischemia.    Dispostion and problem list  Due to change patient be handed off to Dr. Christy Gentles  Recommend following up on remaining lab work, reassess after liter fluids have been given, if patient symptoms have improved likely she will be discharged home with close follow-up with her cardiologist.            Final Clinical Impression(s) / ED Diagnoses Final diagnoses:  Syncope and collapse    Rx / DC Orders ED Discharge Orders     None         Marcello Fennel, PA-C 10/22/22 0542    Ripley Fraise, MD 10/22/22 [phone]

## 2022-10-22 NOTE — Progress Notes (Signed)
  Echocardiogram 2D Echocardiogram has been performed.  [name] 10/22/2022, 12:05 PM

## 2022-10-23 LAB — T3: T3, Total: 86 ng/dL (ref 71–180)

## 2022-10-24 ENCOUNTER — Encounter: Payer: Self-pay | Admitting: Pulmonary Disease

## 2022-10-24 ENCOUNTER — Ambulatory Visit (INDEPENDENT_AMBULATORY_CARE_PROVIDER_SITE_OTHER): Payer: Medicare Other | Admitting: Pulmonary Disease

## 2022-10-24 VITALS — BP 142/68 | HR 55 | Temp 98.2°F | Ht 70.5 in | Wt 210.0 lb

## 2022-10-24 DIAGNOSIS — G4733 Obstructive sleep apnea (adult) (pediatric): Secondary | ICD-10-CM | POA: Diagnosis not present

## 2022-10-24 NOTE — Patient Instructions (Signed)
Will start you on AutoSet APAP 5-15 cm Make sure that you use it regularly every day without fail Follow-up in 3 months with CPAP download.

## 2022-10-24 NOTE — Progress Notes (Addendum)
[name]    [id number]    12-Jun-1941  Primary Care Physician:[name], Mechele Claude, MD  Referring Physician: Fanny Bien, MD [address],  [place] [address]  Chief complaint:   Follow up for Chronic cough OSA GERD  HPI: [name] has PMH of hiatal hernia, GERD, reflux, fibromyalgia.  Follow-up for chronic cough.  Non productive in nature, not associated with dyspnea, wheezing. The cough is worse at night and sometimes she wakes up coughing. She has not been tried on any inhales and tessalon perles helped with cough in past. She has significant issues with allergic rhinitis and post nasal drip. She on Singulair, Claritin and nasonex. She has  GERD and hiatal hernia s/p Nissens fundoplication with subsequent take down in 09. Esophageal manometry in Oct 31, 2017 was normal. Follows with Dr. Silverio Decamp   She has H/O OSA with a positive study in 84. On CPAP at home  2021 [name] is back in clinic after being lost to follow-up since 2017.  Needs an office visit to get CPAP supplies States that she has been using a CPAP regularly with no issues Chronic cough is improved since last visit.  She is on PPI for GERD.  Interim history: Seen in the clinic after a gap of 3 years.  Cough is stable without any issue She recently had an assessment at her cardiologist office that showed mild pulmonary hypertension and has been referred back for reevaluation of sleep apnea  On CPAP of 10 cm for many years.  She is not using it on a regular basis as she sometimes forgets to put the mask on She developed COVID prior to Christmas 2023 with Poor P.O. Intake.  Hospitalized in January 2024 for syncope.  Hospital records reviewed in detail.  She had a CTA during this admission which did not show any pulm embolism but she did have an enlarged pulmonary artery suggestive of pulmonary hypertension.  Outpatient Encounter Medications as of 10/24/2022  Medication Sig   acetaminophen  (TYLENOL) 500 MG tablet Take 1,000 mg by mouth 2 (two) times daily as needed for headache (back and knee pain).   albuterol (VENTOLIN HFA) 108 (90 Base) MCG/ACT inhaler Inhale 1 puff into the lungs as needed for wheezing or shortness of breath.   ascorbic acid (VITAMIN C) 500 MG tablet Take 1 tablet (500 mg total) by mouth daily.   brimonidine (ALPHAGAN) 0.2 % ophthalmic solution 1 drop 2 (two) times daily.   cetirizine (ZYRTEC) 10 MG tablet Take 10 mg by mouth at bedtime.   Cholecalciferol (VITAMIN D3) 250 MCG (10000 UT) capsule Take 10,000 Units by mouth daily.   dexlansoprazole (DEXILANT) 60 MG capsule Take 1 capsule (60 mg total) by mouth daily.   losartan (COZAAR) 25 MG tablet Take 25 mg by mouth 2 (two) times daily.   montelukast (SINGULAIR) 10 MG tablet Take 10 mg by mouth at bedtime.   nystatin ointment (MYCOSTATIN) Apply 1 application  topically See admin instructions. 1 application 1-2 times a day as needed for rash   Polyethyl Glycol-Propyl Glycol (SYSTANE OP) Place 1 drop into both eyes daily as needed (dry/irritated eyes).   rosuvastatin (CRESTOR) 40 MG tablet Take 40 mg by mouth at bedtime.   sertraline (ZOLOFT) 100 MG tablet Take 100 mg by mouth every morning.   sucralfate (CARAFATE) 1 g tablet Take 1 tablet (1 g total) by mouth 4 (four) times daily -  with meals and  at bedtime. (Patient taking differently: Take 1 g by mouth at bedtime.)   zinc sulfate 220 (50 Zn) MG capsule Take 1 capsule (220 mg total) by mouth daily.   No facility-administered encounter medications on file as of 10/24/2022.   Physical Exam: Blood pressure (!) 142/68, pulse (!) 55, temperature 98.2 F (36.8 C), temperature source Oral, height 5' 10.5" (1.791 m), weight 210 lb (95.3 kg), SpO2 99 %. Gen:      No acute distress HEENT:  EOMI, sclera anicteric Neck:     No masses; no thyromegaly Lungs:    Clear to auscultation bilaterally; normal respiratory effort CV:         Regular rate and rhythm; no  murmurs Abd:      + bowel sounds; soft, non-tender; no palpable masses, no distension Ext:    No edema; adequate peripheral perfusion Skin:      Warm and dry; no rash Neuro: alert and oriented x 3 Psych: normal mood and affect   Data Reviewed: Imaging: Barium swallow 08/21/08 Postop changes from a hiatal hernia repair.  No evidence for obstruction or contrast extravasation  Esophageal manometry 11/14/15 Normal relaxation of EG junction, no significant esophageal peristaltic abnormality.  CTA 10/22/2022-no pulmonary embolism or acute intrathoracic pathology.  Dilated PA, moderate.  Hiatal hernia I reviewed the images personally.   PFTs 04/19/16 FVC 2.70 (99%] FEV1 2.8 (809%) F/F 86 TLC 82% DLCO 58%. Moderate reduction in diffusion capacity that corrects for alveolar volume  Sleep: Sleep study 10/13/98 Moderate OSA, RDI 20.   CPAP compliance 10/18/2022 30% compliance Residual AHI 10.8  Cardiac: Echocardiogram 10/22/2022-LVEF 60 to 65%, moderate LVH, grade 1 diastolic dysfunction, mildly elevated PA systolic pressure.  Assessment:  OSA On CPAP of 10 with poor compliance.  She forgets to use her mask on most days.  Even on days that she uses it the residual AHI is 10.1. 30% compliance noted for > 4 hr usage Changed to AutoSet 5-15. Reviewed the need to use her CPAP regularly as she has evidence of mild pulmonary hypertension on echocardiogram. Regular use of CPAP with have benefit her with reduced pulmonary hypertension, stroke, HTN and cardiovascular disease.   Pulmonary hypertension Likely related to poorly treated OSA.  Continue to monitor.  Chronic cough Likely secondary to upper airway syndrome from rhinitis, post nasal drip, hiatal hernia, GERD and acid reflux. This is less likely from asthma and reactive airway disease on PFTs   Her Nissens was taken down in 09 for unclear reasons and she may not be a candidate for a repeat procedure. She has not tolerated chlorpheniramine  due to sleepiness.    She is on Losartan which may on occasion cause cough. She had discussed this with her PCP and elected to continue with it as there are not may other options available to treat hypertension. Besides she was taken of losartan in past but this did not help with her cough.  Her cough is well controlled with consistent use of PPI and medications for seasonal allergies.   Plan/Recommendations: - Change CPAP setting to AutoSet 5-15 - Encourage CPAP compliance - Review in 3 months with CPAP download.

## 2022-10-25 DIAGNOSIS — R001 Bradycardia, unspecified: Secondary | ICD-10-CM | POA: Diagnosis not present

## 2022-10-25 DIAGNOSIS — R55 Syncope and collapse: Secondary | ICD-10-CM | POA: Diagnosis not present

## 2022-10-25 DIAGNOSIS — I10 Essential (primary) hypertension: Secondary | ICD-10-CM | POA: Diagnosis not present

## 2022-10-25 DIAGNOSIS — I517 Cardiomegaly: Secondary | ICD-10-CM | POA: Diagnosis not present

## 2022-10-25 DIAGNOSIS — G4733 Obstructive sleep apnea (adult) (pediatric): Secondary | ICD-10-CM | POA: Diagnosis not present

## 2022-10-25 DIAGNOSIS — R42 Dizziness and giddiness: Secondary | ICD-10-CM | POA: Diagnosis not present

## 2022-10-26 ENCOUNTER — Ambulatory Visit (HOSPITAL_COMMUNITY): Payer: Medicare Other

## 2022-10-29 ENCOUNTER — Encounter: Payer: Self-pay | Admitting: Cardiology

## 2022-10-29 ENCOUNTER — Ambulatory Visit: Payer: Medicare Other | Attending: Cardiology | Admitting: Cardiology

## 2022-10-29 VITALS — BP 158/92 | HR 65 | Ht 70.0 in | Wt 211.2 lb

## 2022-10-29 DIAGNOSIS — E782 Mixed hyperlipidemia: Secondary | ICD-10-CM | POA: Diagnosis not present

## 2022-10-29 DIAGNOSIS — R55 Syncope and collapse: Secondary | ICD-10-CM

## 2022-10-29 MED ORDER — POTASSIUM CHLORIDE ER 10 MEQ PO TBCR: 10.0000 meq | EXTENDED_RELEASE_TABLET | ORAL | 3 refills | Status: DC

## 2022-10-29 MED ORDER — FUROSEMIDE 20 MG PO TABS: 20.0000 mg | ORAL_TABLET | ORAL | 3 refills | Status: DC

## 2022-10-29 NOTE — Progress Notes (Signed)
Cardiology Office Note:    Date:  10/29/2022   ID:  [name], DOB 1941-04-11, MRN [id number]  PCP:  Fanny Bien, MD  Cardiologist:  Berniece Salines, DO  Electrophysiologist:  None   Referring MD: Fanny Bien, MD   " I am having shortness of breath"   History of Present Illness:    [name] is a 82 y.o. female with a hx of hypertension, occasional PVC , asthma, anxiety, GERD, diabetes mellitus, fibromyalgia here today for follow-up visit.  The patient previously followed with Dr. Gwenlyn Found and has requested to transition her care to me. She was seen by Dr. Alvester Chou in September 2023 at that time no medication changes were made.  She has since been hospitalized.  She is here today to follow-up visit because she noted that she was recently in the hospital where she was told that she had dilated heart and she is concerned about that.  She reports that she has been experiencing some shortness of breath on exertion.  This is getting worse.  She also tells me that she is feeling a mass in her neck that mostly is movable.   Chart review she was seen at [place] due to being positive for COVID. She was recently admitted to the hospital for 1 day on 05/21/2022 with fatigue, dizziness and dehydration.  She was also mildly bradycardic.  Since that time her hydrochlorothiazide was discontinued, her losartan uptitrated and she feels clinically improved.  She does have a remote history of normal cardiac catheterization by Dr. Melvern Banker  03/14/1992, coronary CTA performed 12/04/2021 that revealed a coronary calcium score of 0 with normal coronary arteries and a normal 2D echo.  A recent event monitor showed essentially normal rhythm with occasional PVCs, PACs and short runs of SVT with a mean heart rate of 63.   I saw the patient on October 12, 2019.  At that time she had just been discharged from the hospital.  I started her on Lasix given she did have some fluid overload and got her  echocardiogram.  Since her last visit she had a syncope episode and was admitted to [facility] where she ended up getting her echocardiogram.  No evidence of any valvular abnormalities.  Tells me these episodes feels as if she is having blurry vision and then suddenly she passes out.  During that time she noted that she has had a systolic blood pressure in the 80s.  Past Medical History:  Diagnosis Date   Allergy    Anal or rectal pain    sometimes   Anemia    hx of during pregnancy   Anxiety    Arthritis    Asthma    hx of   Bradycardia    " I KNOW I HAVE BRADYCARDIA ESPECIALLY WHEN I SLEEP"    Cataract 2021   bilateral eyes   Chronic back pain    Degenerative joint disease    osteo   Depression    Diabetes mellitus without complication ([place])    DM type II   Diverticulosis 2003   Dysrhythmia    hx of  due to eye drop and also low heart rate 40's per pt. Dr. Gwenlyn Found follows   Elevated total protein    Esophageal dysmotility    Fibromyalgia    GERD (gastroesophageal reflux disease)    subsequent Nissen Fundoplication   Glaucoma    Hearing loss    Heart murmur    "was  told she had a heart murmur"   Hemorrhoids    Hiatal hernia 11/08/09   Hx of adenomatous colonic polyps 07/02/02   Hypercalcemia    Hyperlipidemia    Hyperlipidemia    Hypertension    Nausea    Osteoporosis    PONV (postoperative nausea and vomiting)    Rectal bleeding    from hemorrhoids.     Sleep apnea    DOES USE CPAP    Thrombocytopenia (HCC)    Varicose veins of left lower extremity     Past Surgical History:  Procedure Laterality Date   CARDIAC CATHETERIZATION  03/14/1992   Normal cardiac cath. Normal LV function.   CARDIOVASCULAR STRESS TEST  01/22/2011   No scintigraphic evidence of inducible ischemia.   CAROTID DOPPLER  03/31/2007   Bilateral ICAs - no evidence of significant diameter reduction, dissectin, tortuosity, FMD, or any other vascular abnormality.   CATARACT EXTRACTION,  BILATERAL     ESOPHAGEAL MANOMETRY N/A 11/14/2015   Procedure: ESOPHAGEAL MANOMETRY (EM);  Surgeon: Mauri Pole, MD;  Location: [facility];  Service: Endoscopy;  Laterality: N/A;   ESOPHAGEAL MANOMETRY N/A 01/18/2021   Procedure: ESOPHAGEAL MANOMETRY (EM);  Surgeon: Mauri Pole, MD;  Location: [facility];  Service: Endoscopy;  Laterality: N/A;   ESOPHAGOGASTRODUODENOSCOPY N/A 03/08/2021   Procedure: ESOPHAGOGASTRODUODENOSCOPY (EGD);  Surgeon: Lajuana Matte, MD;  Location: [place];  Service: Thoracic;  Laterality: N/A;   EYE SURGERY     bilateral cataracts; bilateral stents   GASTRIC RESECTION  2009   GLAUCOMA SURGERY     JOINT REPLACEMENT     right knee Dr. Wynelle Link 06-23-18   KNEE SURGERY Bilateral    NISSEN FUNDOPLICATION  8563   with subsequent takedown in 2009   [place] Left 06/10/2017   Procedure: LEFT  TOTAL KNEE ARTHROPLASTY;  Surgeon: Gaynelle Arabian, MD;  Location: [facility];  Service: Orthopedics;  Laterality: Left;  Adductor Block   TOTAL KNEE ARTHROPLASTY Right 06/23/2018   Procedure: RIGHT TOTAL KNEE ARTHROPLASTY;  Surgeon: Gaynelle Arabian, MD;  Location: [facility];  Service: Orthopedics;  Laterality: Right;   TRANSTHORACIC ECHOCARDIOGRAM  12/21/2010   EF 60%, moderate LVH,    TUBAL LIGATION     XI ROBOTIC ASSISTED HIATAL HERNIA REPAIR N/A 03/08/2021   Procedure: XI ROBOTIC ASSISTED LAPAROSCOPY WITH LYSIS OF ADHESIONS;  Surgeon: Lajuana Matte, MD;  Location: [facility];  Service: Thoracic;  Laterality: N/A;    Current Medications: Current Meds  Medication Sig   acetaminophen (TYLENOL) 500 MG tablet Take 1,000 mg by mouth 2 (two) times daily as needed for headache (back and knee pain).   albuterol (VENTOLIN HFA) 108 (90 Base) MCG/ACT inhaler Inhale 1 puff into the lungs as needed for wheezing or shortness of breath.   ascorbic acid (VITAMIN C) 500 MG tablet Take 1 tablet (500 mg total) by mouth daily.   brimonidine (ALPHAGAN) 0.2 % ophthalmic solution  1 drop 2 (two) times daily.   cetirizine (ZYRTEC) 10 MG tablet Take 10 mg by mouth at bedtime.   Cholecalciferol (VITAMIN D3) 250 MCG (10000 UT) capsule Take 10,000 Units by mouth daily.   dexlansoprazole (DEXILANT) 60 MG capsule Take 1 capsule (60 mg total) by mouth daily.   losartan (COZAAR) 25 MG tablet Take 25 mg by mouth 2 (two) times daily.   montelukast (SINGULAIR) 10 MG tablet Take 10 mg by mouth at bedtime.   nystatin ointment (MYCOSTATIN) Apply 1 application  topically See admin instructions. 1  application 1-2 times a day as needed for rash   Polyethyl Glycol-Propyl Glycol (SYSTANE OP) Place 1 drop into both eyes daily as needed (dry/irritated eyes).   rosuvastatin (CRESTOR) 40 MG tablet Take 40 mg by mouth at bedtime.   sertraline (ZOLOFT) 100 MG tablet Take 100 mg by mouth every morning.   sucralfate (CARAFATE) 1 g tablet Take 1 tablet (1 g total) by mouth 4 (four) times daily -  with meals and at bedtime. (Patient taking differently: Take 1 g by mouth at bedtime.)   zinc sulfate 220 (50 Zn) MG capsule Take 1 capsule (220 mg total) by mouth daily.     Allergies:   Adalat [nifedipine], Hydrocodone, Meperidine hcl, and Naproxen   Social History   Socioeconomic History   Marital status: Married    Spouse name: Not on file   Number of children: 8   Years of education: Not on file   Highest education level: Not on file  Occupational History   Occupation: retired    Comment: retired Therapist, art.   Tobacco Use   Smoking status: Never   Smokeless tobacco: Never  Vaping Use   Vaping Use: Never used  Substance and Sexual Activity   Alcohol use: No    Alcohol/week: 0.0 standard drinks of alcohol   Drug use: No   Sexual activity: Yes  Other Topics Concern   Not on file  Social History Narrative   Husband, Brissa Asante is Next of Kin. Cell # [phone]   Social Determinants of Health   Financial Resource Strain: Not on file  Food Insecurity: Not on file  Transportation  Needs: Not on file  Physical Activity: Not on file  Stress: Not on file  Social Connections: Not on file     Family History: The patient's family history includes Breast cancer in her daughter; Cancer in her maternal grandmother; Diabetes in her father and mother; Heart disease in her mother; Hypertension in her mother; Kidney disease in her father and maternal grandfather; Leukemia in her maternal uncle; Prostate cancer in her maternal uncle; Stomach cancer in her maternal aunt; Stroke in her brother; Uterine cancer in her maternal aunt. There is no history of Colon cancer, Rectal cancer, or Esophageal cancer.  ROS:   Review of Systems  Constitution: Negative for decreased appetite, fever and weight gain.  HENT: Negative for congestion, ear discharge, hoarse voice and sore throat.   Eyes: Negative for discharge, redness, vision loss in right eye and visual halos.  Cardiovascular: Reports dyspnea exertion.  Negative for chest pain, leg swelling, orthopnea and palpitations.  Respiratory: Negative for cough, hemoptysis, shortness of breath and snoring.   Endocrine: Negative for heat intolerance and polyphagia.  Hematologic/Lymphatic: Negative for bleeding problem. Does not bruise/bleed easily.  Skin: Negative for flushing, nail changes, rash and suspicious lesions.  Musculoskeletal: Negative for arthritis, joint pain, muscle cramps, myalgias, neck pain and stiffness.  Gastrointestinal: Negative for abdominal pain, bowel incontinence, diarrhea and excessive appetite.  Genitourinary: Negative for decreased libido, genital sores and incomplete emptying.  Neurological: Negative for brief paralysis, focal weakness, headaches and loss of balance.  Psychiatric/Behavioral: Negative for altered mental status, depression and suicidal ideas.  Allergic/Immunologic: Negative for HIV exposure and persistent infections.    EKGs/Labs/Other Studies Reviewed:    The following studies were reviewed  today:   EKG:  The ekg ordered today demonstrates sinus rhythm, heart rate 60 bpm  CTA of the chest December 2023  IMPRESSION: 1. No evidence of pulmonary embolism.  2. Suspect mild pulmonary interstitial edema. 3. Borderline enlarged main pulmonary artery suggesting pulmonary hypertension. Additionally, there is cardiomegaly with reflux of contrast into the IVC/hepatic veins suggesting right heart dysfunction. 4. Unchanged hiatal hernia.     Electronically Signed   By: Valetta Mole M.D.   On: 10/04/2022 14:03    Coronary CTA February 2023 FINDINGS: Non-cardiac: See separate report from [facility]. No significant findings on limited lung and soft tissue windows.   Calcium Score: No calcium noted in coronary arteries   Coronary Arteries: Right dominant with no anomalies   LM: Normal   LAD: Normal   D1: Normal   D2: Normal   Circumflex: Normal   OM1: Normal   OM2: Normal   RCA: Normal   PDA: Normal   PLA: Normal   IMPRESSION: 1. Calcium score 0   2.  Normal right dominant coronary arteries   3.  Normal ascending thoracic aorta 3.6 cm   Jenkins Rouge   Electronically Signed: By: Jenkins Rouge M.D. On: 12/04/2021 12:58    Echocardiogram February 2023 IMPRESSIONS   1. Left ventricular ejection fraction, by estimation, is 60 to 65%. The  left ventricle has normal function. The left ventricle has no regional  wall motion abnormalities. There is mild concentric left ventricular  hypertrophy. Left ventricular diastolic  parameters are consistent with Grade I diastolic dysfunction (impaired  relaxation).   2. Right ventricular systolic function is normal. The right ventricular  size is normal.   3. Left atrial size was mild to moderately dilated.   4. The mitral valve is normal in structure. Trivial mitral valve  regurgitation. No evidence of mitral stenosis.   5. The aortic valve is tricuspid. Aortic valve regurgitation is not  visualized.  No aortic stenosis is present.   6. Aortic dilatation noted. There is borderline dilatation of the  ascending aorta, measuring 36 mm.   7. The inferior vena cava is normal in size with greater than 50%  respiratory variability, suggesting right atrial pressure of 3 mmHg.   Recent Labs: 10/11/2022: Magnesium 2.3; NT-Pro BNP 181 10/21/2022: B Natriuretic Peptide 74.6 10/22/2022: ALT 10; BUN 13; Creatinine, Ser 0.99; Hemoglobin 12.1; Platelets 143; Potassium 3.7; Sodium 134; TSH 0.303  Recent Lipid Panel    Component Value Date/Time   CHOL 200 (H) 10/26/2020 0942   TRIG 46 10/26/2020 0942   HDL 76 10/26/2020 0942   CHOLHDL 2.6 10/26/2020 0942   CHOLHDL 3.2 07/24/2020 2044   VLDL 13 07/24/2020 2044   LDLCALC 115 (H) 10/26/2020 0942    Physical Exam:    VS:  BP (!) 158/92 (BP Location: Left Arm)   Pulse 65   Ht '5\' 10"'$  (1.778 m)   Wt 95.8 kg   SpO2 95%   BMI 30.30 kg/m     Wt Readings from Last 3 Encounters:  10/29/22 95.8 kg  10/24/22 95.3 kg  10/21/22 95.8 kg     GEN: Well nourished, well developed in no acute distress HEENT: Normal NECK: Noted JVD; No carotid bruits, there is also suspicion of a small likely fat pad/cyst on palpation of the right neck LYMPHATICS: No lymphadenopathy CARDIAC: S1S2 noted,RRR, no murmurs, rubs, gallops RESPIRATORY:  Clear to auscultation without rales, wheezing or rhonchi  ABDOMEN: Soft, non-tender, non-distended, +bowel sounds, no guarding. EXTREMITIES: +2 bilateral lower extremity edema edema, No cyanosis, no clubbing MUSCULOSKELETAL:  No deformity  SKIN: Warm and dry NEUROLOGIC:  Alert and oriented x 3, non-focal PSYCHIATRIC:  Normal affect, good  insight  ASSESSMENT:    No diagnosis found.  PLAN:    I reviewed her echocardiogram with her which show evidence of mild pulmonary artery hypertension.  With moderate dilatation of the right ventricle. She has had some improvement with the swelling on the Lasix.  But this was stopped during  her hospitalization.  Due to believe she can benefit from Lasix once weekly.  In terms of her syncope she has had multiple episodes her ambulatory monitors have all been normal.  I think she would benefit from a repeatable loop recorder.  We referred the patient to EP for evaluation for this.  Blood pressure slightly elevated but given episodes of orthostatic hypotension we will hold off on increasing antihypertensive medication.  I have advised the patient to use abdominal binder on a daily basis.  The patient understands the need to lose weight with diet and exercise. We have discussed specific strategies for this.   The patient is in agreement with the above plan. The patient left the office in stable condition.  The patient will follow up in 6 weeks or sooner if needed.   Medication Adjustments/Labs and Tests Ordered: Current medicines are reviewed at length with the patient today.  Concerns regarding medicines are outlined above.  No orders of the defined types were placed in this encounter.  No orders of the defined types were placed in this encounter.   Patient Instructions  Medication Instructions:  Your physician has recommended you make the following change in your medication:  START: Lasix 20 mg once weekly START: Potassium 10 mEq once weekly *If you need a refill on your cardiac medications before your next appointment, please call your pharmacy*   Lab Work: None   Testing/Procedures: None   Follow-Up: At [facility], you and your health needs are our priority.  As part of our continuing mission to provide you with exceptional heart care, we have created designated Provider Care Teams.  These Care Teams include your primary Cardiologist (physician) and Advanced Practice Providers (APPs -  Physician Assistants and Nurse Practitioners) who all work together to provide you with the care you need, when you need it.  We recommend signing up for the patient  portal called "MyChart".  Sign up information is provided on this After Visit Summary.  MyChart is used to connect with patients for Virtual Visits (Telemedicine).  Patients are able to view lab/test results, encounter notes, upcoming appointments, etc.  Non-urgent messages can be sent to your provider as well.   To learn more about what you can do with MyChart, go to [email or web address].    Your next appointment:   12 week(s)  Provider:   Berniece Salines, DO     Other Instructions Please get an abdominal binder and wear it daily.     Adopting a Healthy Lifestyle.  Know what a healthy weight is for you (roughly BMI <25) and aim to maintain this   Aim for 7+ servings of fruits and vegetables daily   65-80+ fluid ounces of water or unsweet tea for healthy kidneys   Limit to max 1 drink of alcohol per day; avoid smoking/tobacco   Limit animal fats in diet for cholesterol and heart health - choose grass fed whenever available   Avoid highly processed foods, and foods high in saturated/trans fats   Aim for low stress - take time to unwind and care for your mental health   Aim for 150 min of moderate intensity exercise weekly for heart  health, and weights twice weekly for bone health   Aim for 7-9 hours of sleep daily   When it comes to diets, agreement about the perfect plan isnt easy to find, even among the experts. Experts at the [employer] developed an idea known as the Healthy Eating Plate. Just imagine a plate divided into logical, healthy portions.   The emphasis is on diet quality:   Load up on vegetables and fruits - one-half of your plate: Aim for color and variety, and remember that potatoes dont count.   Go for whole grains - one-quarter of your plate: Whole wheat, barley, wheat berries, quinoa, oats, brown rice, and foods made with them. If you want pasta, go with whole wheat pasta.   Protein power - one-quarter of your plate: Fish, chicken,  beans, and nuts are all healthy, versatile protein sources. Limit red meat.   The diet, however, does go beyond the plate, offering a few other suggestions.   Use healthy plant oils, such as olive, canola, soy, corn, sunflower and peanut. Check the labels, and avoid partially hydrogenated oil, which have unhealthy trans fats.   If youre thirsty, drink water. Coffee and tea are good in moderation, but skip sugary drinks and limit milk and dairy products to one or two daily servings.   The type of carbohydrate in the diet is more important than the amount. Some sources of carbohydrates, such as vegetables, fruits, whole grains, and beans-are healthier than others.   Finally, stay active  Signed, Berniece Salines, DO  10/29/2022 8:42 AM    [place]

## 2022-10-29 NOTE — Patient Instructions (Signed)
Medication Instructions:  Your physician has recommended you make the following change in your medication:  START: Lasix 20 mg once weekly START: Potassium 10 mEq once weekly *If you need a refill on your cardiac medications before your next appointment, please call your pharmacy*   Lab Work: None   Testing/Procedures: None   Follow-Up: At [facility], you and your health needs are our priority.  As part of our continuing mission to provide you with exceptional heart care, we have created designated Provider Care Teams.  These Care Teams include your primary Cardiologist (physician) and Advanced Practice Providers (APPs -  Physician Assistants and Nurse Practitioners) who all work together to provide you with the care you need, when you need it.  We recommend signing up for the patient portal called "MyChart".  Sign up information is provided on this After Visit Summary.  MyChart is used to connect with patients for Virtual Visits (Telemedicine).  Patients are able to view lab/test results, encounter notes, upcoming appointments, etc.  Non-urgent messages can be sent to your provider as well.   To learn more about what you can do with MyChart, go to [email or web address].    Your next appointment:   12 week(s)  Provider:   Berniece Salines, DO     Other Instructions Please get an abdominal binder and wear it daily.

## 2022-10-30 DIAGNOSIS — G4700 Insomnia, unspecified: Secondary | ICD-10-CM | POA: Diagnosis not present

## 2022-10-30 DIAGNOSIS — F331 Major depressive disorder, recurrent, moderate: Secondary | ICD-10-CM | POA: Diagnosis not present

## 2022-10-30 DIAGNOSIS — I10 Essential (primary) hypertension: Secondary | ICD-10-CM | POA: Diagnosis not present

## 2022-10-30 DIAGNOSIS — F411 Generalized anxiety disorder: Secondary | ICD-10-CM | POA: Diagnosis not present

## 2022-10-30 DIAGNOSIS — M169 Osteoarthritis of hip, unspecified: Secondary | ICD-10-CM | POA: Diagnosis not present

## 2022-11-06 ENCOUNTER — Telehealth: Payer: Self-pay | Admitting: Pulmonary Disease

## 2022-11-06 DIAGNOSIS — G4733 Obstructive sleep apnea (adult) (pediatric): Secondary | ICD-10-CM

## 2022-11-06 NOTE — Telephone Encounter (Signed)
Message from [employer]:   New, Sherie Don, Leanora Ivanoff Chauncy Passy Raven,  "Since this is for a new CPAP, We will need usage and benefit notes to process. The current notes state she has note been using / using part time.  and I don't see any recent PAP notes that talk about benefit and usage. Without usage and benefit notes she will need a new sleep study.  Thank you,  Leroy Sea New "   In need of a sleep study order.

## 2022-11-07 NOTE — Telephone Encounter (Signed)
Good morning sir,  Leroy Sea from [employer] is needing updated notes on patient and cpap machine. He states the current note states she is not using it or is doing it part time. He is needing an updated note talking about using the cpap for the benefits and the usage.   He states without benefits and usage notes she will need new sleep study.  Please advise sir

## 2022-11-08 ENCOUNTER — Other Ambulatory Visit (HOSPITAL_COMMUNITY): Payer: Medicare Other

## 2022-11-09 NOTE — Telephone Encounter (Signed)
Called patient to give her an update that we are waiting on Dr Vaughan Browner to update his note for [place] to sent to [employer]. Will call her back with update once notes are faxed.

## 2022-11-09 NOTE — Telephone Encounter (Signed)
Patient checking on message for CPAP machine. Patient phone number is [phone].

## 2022-11-20 ENCOUNTER — Other Ambulatory Visit: Payer: Self-pay

## 2022-11-20 DIAGNOSIS — G4733 Obstructive sleep apnea (adult) (pediatric): Secondary | ICD-10-CM

## 2022-11-20 NOTE — Telephone Encounter (Signed)
Spoke with patient she states new cpap settings of 5-15 are not on current machine She states the settings still show 10-14.She is wondering if that will be changed or the new settings will start on the new machine?  Dr. Vaughan Browner please advise

## 2022-11-20 NOTE — Telephone Encounter (Signed)
I have addended the note. She is not very compliant and I am not sure if 30% compliance will be enough to get the CPAP ordered. If we need a new sleep study to get a new device then order a home sleep study

## 2022-11-27 NOTE — Telephone Encounter (Signed)
Can you change the order of the current machine for CPAP 5-15

## 2022-11-27 NOTE — Telephone Encounter (Signed)
New DME order placed to [employer] for cpap setting change to 5-15.  Called patient and made her aware of change. Understanding stated. Nothing further at this time.

## 2022-11-28 ENCOUNTER — Ambulatory Visit: Payer: Medicare Other | Admitting: Cardiology

## 2022-11-28 DIAGNOSIS — R0609 Other forms of dyspnea: Secondary | ICD-10-CM | POA: Diagnosis not present

## 2022-11-28 DIAGNOSIS — I10 Essential (primary) hypertension: Secondary | ICD-10-CM | POA: Diagnosis not present

## 2022-11-28 DIAGNOSIS — R55 Syncope and collapse: Secondary | ICD-10-CM | POA: Diagnosis not present

## 2022-11-29 ENCOUNTER — Ambulatory Visit: Payer: Medicare Other | Admitting: Cardiology

## 2022-12-04 ENCOUNTER — Other Ambulatory Visit: Payer: Self-pay | Admitting: Physician Assistant

## 2022-12-06 ENCOUNTER — Ambulatory Visit: Payer: Medicare Other | Attending: Cardiology | Admitting: Cardiology

## 2022-12-06 ENCOUNTER — Encounter: Payer: Self-pay | Admitting: Cardiology

## 2022-12-06 VITALS — BP 128/70 | HR 82 | Ht 70.5 in | Wt 215.4 lb

## 2022-12-06 DIAGNOSIS — R55 Syncope and collapse: Secondary | ICD-10-CM | POA: Diagnosis not present

## 2022-12-06 NOTE — Patient Instructions (Signed)
Medication Instructions:  Your physician recommends that you continue on your current medications as directed. Please refer to the Current Medication list given to you today.  Labwork: None ordered.  Testing/Procedures: None ordered.  Follow-Up:  Your physician wants you to follow-up as needed with Dr. Elzie Rings.  You will receive a reminder letter in the mail two months in advance. If you don't receive a letter, please call our office to schedule the follow-up appointment.    Implantable Loop Recorder Placement, Care After This sheet gives you information about how to care for yourself after your procedure. Your health care provider may also give you more specific instructions. If you have problems or questions, contact your health care provider. What can I expect after the procedure? After the procedure, it is common to have: Soreness or discomfort near the incision. Some swelling or bruising near the incision.  Follow these instructions at home: Incision care  Monitor your cardiac device site for redness, swelling, and drainage. Call the device clinic at [phone] if you experience these symptoms or fever/chills.  Keep the large square bandage on your site for 24 hours and then you may remove it yourself. Keep the steri-strips underneath in place.   You may shower after 72 hours / 3 days from your procedure with the steri-strips in place. They will usually fall off on their own, or may be removed after 10 days. Pat dry.   Avoid lotions, ointments, or perfumes over your incision until it is well-healed.  Please do not submerge in water until your site is completely healed.   Your device is MRI compatible.   Remote monitoring is used to monitor your cardiac device from home. This monitoring is scheduled every month by our office. It allows [place] to keep an eye on the function of your device to ensure it is working properly.  If your wound site starts to bleed apply pressure.     For help with the monitor please call [employer] Monitor Support Specialist directly at [phone].    If you have any questions/concerns please call the device clinic at [phone].  Activity  Return to your normal activities.  General instructions Follow instructions from your health care provider about how to manage your implantable loop recorder and transmit the information. Learn how to activate a recording if this is necessary for your type of device. You may go through a metal detection gate, and you may let someone hold a metal detector over your chest. Show your ID card if needed. Do not have an MRI unless you check with your health care provider first. Take over-the-counter and prescription medicines only as told by your health care provider. Keep all follow-up visits as told by your health care provider. This is important. Contact a health care provider if: You have redness, swelling, or pain around your incision. You have a fever. You have pain that is not relieved by your pain medicine. You have triggered your device because of fainting (syncope) or because of a heartbeat that feels like it is racing, slow, fluttering, or skipping (palpitations). Get help right away if you have: Chest pain. Difficulty breathing. Summary After the procedure, it is common to have soreness or discomfort near the incision. Change your dressing as told by your health care provider. Follow instructions from your health care provider about how to manage your implantable loop recorder and transmit the information. Keep all follow-up visits as told by your health care provider. This is important. This information is  not intended to replace advice given to you by your health care provider. Make sure you discuss any questions you have with your health care provider. Document Released: 09/12/2015 Document Revised: 11/16/2017 Document Reviewed: 11/16/2017 Elsevier Patient Education  2020 [employer].

## 2022-12-06 NOTE — Progress Notes (Signed)
Electrophysiology Office Note   Date:  12/06/2022   ID:  Cheryl, Bates 12-24-1940, MRN [id number]  PCP:  Fanny Bien, MD  Cardiologist:  Tobb Primary Electrophysiologist:  Ulani Degrasse Meredith Leeds, MD    Chief Complaint: syncope   History of Present Illness: [name] is a 82 y.o. female who is being seen today for the evaluation of syncope at the request of Tobb, Kardie, DO. Presenting today for electrophysiology evaluation.  History significant for hypertension, PVCs, asthma, anxiety, GERD, diabetes, fibromyalgia.  She did have an episode of syncope.  She was admitted to the hospital during her episode.  She has frequent episodes of dizziness when she stands up.  Symptoms are going on for approximately a year.  She had a previous episode of syncope in January 2023.  The only time her symptoms occur is when she is standing.  That can happen shortly after she stands or after a few minutes.  She has had up to 4 these episodes.  They have occurred every few months.  She has worn a cardiac monitor that showed no major abnormalities but she did not have an episode while wearing the monitor.  Today, she denies symptoms of palpitations, chest pain, shortness of breath, orthopnea, PND, lower extremity edema, claudication, dizziness, presyncope, syncope, bleeding, or neurologic sequela. The patient is tolerating medications without difficulties.    Past Medical History:  Diagnosis Date   Allergy    Anal or rectal pain    sometimes   Anemia    hx of during pregnancy   Anxiety    Arthritis    Asthma    hx of   Bradycardia    " I KNOW I HAVE BRADYCARDIA ESPECIALLY WHEN I SLEEP"    Cataract 2021   bilateral eyes   Chronic back pain    Degenerative joint disease    osteo   Depression    Diabetes mellitus without complication ([place])    DM type II   Diverticulosis 2003   Dysrhythmia    hx of  due to eye drop and also low heart rate 40's per pt. Dr. Gwenlyn Found follows   Elevated  total protein    Esophageal dysmotility    Fibromyalgia    GERD (gastroesophageal reflux disease)    subsequent Nissen Fundoplication   Glaucoma    Hearing loss    Heart murmur    "was told she had a heart murmur"   Hemorrhoids    Hiatal hernia 11/08/09   Hx of adenomatous colonic polyps 07/02/02   Hypercalcemia    Hyperlipidemia    Hyperlipidemia    Hypertension    Nausea    Osteoporosis    PONV (postoperative nausea and vomiting)    Rectal bleeding    from hemorrhoids.     Sleep apnea    DOES USE CPAP    Thrombocytopenia (HCC)    Varicose veins of left lower extremity    Past Surgical History:  Procedure Laterality Date   CARDIAC CATHETERIZATION  03/14/1992   Normal cardiac cath. Normal LV function.   CARDIOVASCULAR STRESS TEST  01/22/2011   No scintigraphic evidence of inducible ischemia.   CAROTID DOPPLER  03/31/2007   Bilateral ICAs - no evidence of significant diameter reduction, dissectin, tortuosity, FMD, or any other vascular abnormality.   CATARACT EXTRACTION, BILATERAL     ESOPHAGEAL MANOMETRY N/A 11/14/2015   Procedure: ESOPHAGEAL MANOMETRY (EM);  Surgeon: Mauri Pole, MD;  Location: [facility];  Service:  Endoscopy;  Laterality: N/A;   ESOPHAGEAL MANOMETRY N/A 01/18/2021   Procedure: ESOPHAGEAL MANOMETRY (EM);  Surgeon: Mauri Pole, MD;  Location: [facility];  Service: Endoscopy;  Laterality: N/A;   ESOPHAGOGASTRODUODENOSCOPY N/A 03/08/2021   Procedure: ESOPHAGOGASTRODUODENOSCOPY (EGD);  Surgeon: Lajuana Matte, MD;  Location: [facility];  Service: Thoracic;  Laterality: N/A;   EYE SURGERY     bilateral cataracts; bilateral stents   GASTRIC RESECTION  2009   GLAUCOMA SURGERY     JOINT REPLACEMENT     right knee Dr. Wynelle Link 06-23-18   KNEE SURGERY Bilateral    NISSEN FUNDOPLICATION  [id number]   with subsequent takedown in 2009   [place] Left 06/10/2017   Procedure: LEFT  TOTAL KNEE ARTHROPLASTY;  Surgeon: Gaynelle Arabian, MD;  Location:  [facility];  Service: Orthopedics;  Laterality: Left;  Adductor Block   TOTAL KNEE ARTHROPLASTY Right 06/23/2018   Procedure: RIGHT TOTAL KNEE ARTHROPLASTY;  Surgeon: Gaynelle Arabian, MD;  Location: [facility];  Service: Orthopedics;  Laterality: Right;   TRANSTHORACIC ECHOCARDIOGRAM  12/21/2010   EF 60%, moderate LVH,    TUBAL LIGATION     XI ROBOTIC ASSISTED HIATAL HERNIA REPAIR N/A 03/08/2021   Procedure: XI ROBOTIC ASSISTED LAPAROSCOPY WITH LYSIS OF ADHESIONS;  Surgeon: Lajuana Matte, MD;  Location: [facility];  Service: Thoracic;  Laterality: N/A;     Current Outpatient Medications  Medication Sig Dispense Refill   acetaminophen (TYLENOL) 500 MG tablet Take 1,000 mg by mouth 2 (two) times daily as needed for headache (back and knee pain).     albuterol (VENTOLIN HFA) 108 (90 Base) MCG/ACT inhaler Inhale 1 puff into the lungs as needed for wheezing or shortness of breath.     ascorbic acid (VITAMIN C) 500 MG tablet Take 1 tablet (500 mg total) by mouth daily. 90 tablet 0   brimonidine (ALPHAGAN) 0.2 % ophthalmic solution 1 drop 2 (two) times daily.     cetirizine (ZYRTEC) 10 MG tablet Take 10 mg by mouth at bedtime.     Cholecalciferol (VITAMIN D3) 250 MCG (10000 UT) capsule Take 10,000 Units by mouth daily.     dexlansoprazole (DEXILANT) 60 MG capsule Take 1 capsule (60 mg total) by mouth daily. 90 capsule 3   furosemide (LASIX) 20 MG tablet Take 1 tablet (20 mg total) by mouth once a week. 13 tablet 3   losartan (COZAAR) 25 MG tablet Take 25 mg by mouth 2 (two) times daily.     montelukast (SINGULAIR) 10 MG tablet Take 10 mg by mouth at bedtime.     nystatin ointment (MYCOSTATIN) Apply 1 application  topically See admin instructions. 1 application 1-2 times a day as needed for rash  0   Polyethyl Glycol-Propyl Glycol (SYSTANE OP) Place 1 drop into both eyes daily as needed (dry/irritated eyes).     potassium chloride (KLOR-CON) 10 MEQ tablet Take 1 tablet (10 mEq total) by mouth once a week. 13  tablet 3   rosuvastatin (CRESTOR) 40 MG tablet Take 40 mg by mouth at bedtime.     sertraline (ZOLOFT) 100 MG tablet Take 100 mg by mouth every morning.     sucralfate (CARAFATE) 1 g tablet Take 1 tablet (1 g total) by mouth 4 (four) times daily -  with meals and at bedtime. (Patient taking differently: Take 1 g by mouth at bedtime.) 120 tablet 11   zinc sulfate 220 (50 Zn) MG capsule Take 1 capsule (220 mg total) by mouth daily. [place]  capsule 0   No current facility-administered medications for this visit.    Allergies:   Adalat [nifedipine], Hydrocodone, Meperidine hcl, and Naproxen   Social History:  The patient  reports that she has never smoked. She has never used smokeless tobacco. She reports that she does not drink alcohol and does not use drugs.   Family History:  The patient's family history includes Breast cancer in her daughter; Cancer in her maternal grandmother; Diabetes in her father and mother; Heart disease in her mother; Hypertension in her mother; Kidney disease in her father and maternal grandfather; Leukemia in her maternal uncle; Prostate cancer in her maternal uncle; Stomach cancer in her maternal aunt; Stroke in her brother; Uterine cancer in her maternal aunt.    ROS:  Please see the history of present illness.   Otherwise, review of systems is positive for none.   All other systems are reviewed and negative.    PHYSICAL EXAM: VS:  BP 128/70   Pulse 82   Ht 5' 10.5" (1.791 m)   Wt 215 lb 6.4 oz (97.7 kg)   SpO2 99%   BMI 30.47 kg/m  , BMI Body mass index is 30.47 kg/m. GEN: Well nourished, well developed, in no acute distress  HEENT: normal  Neck: no JVD, carotid bruits, or masses Cardiac: RRR; no murmurs, rubs, or gallops,no edema  Respiratory:  clear to auscultation bilaterally, normal work of breathing GI: soft, nontender, nondistended, + BS MS: no deformity or atrophy  Skin: warm and dry Neuro:  Strength and sensation are intact Psych: euthymic mood,  full affect  EKG:  EKG is not ordered today. Personal review of the ekg ordered 10/21/22 shows sinus rhythm, LVH  Recent Labs: 10/11/2022: Magnesium 2.3; NT-Pro BNP 181 10/21/2022: B Natriuretic Peptide 74.6 10/22/2022: ALT 10; BUN 13; Creatinine, Ser 0.99; Hemoglobin 12.1; Platelets 143; Potassium 3.7; Sodium 134; TSH 0.303    Lipid Panel     Component Value Date/Time   CHOL 200 (H) 10/26/2020 0942   TRIG 46 10/26/2020 0942   HDL 76 10/26/2020 0942   CHOLHDL 2.6 10/26/2020 0942   CHOLHDL 3.2 07/24/2020 2044   VLDL 13 07/24/2020 2044   LDLCALC 115 (H) 10/26/2020 0942     Wt Readings from Last 3 Encounters:  12/06/22 215 lb 6.4 oz (97.7 kg)  10/29/22 211 lb 3.2 oz (95.8 kg)  10/24/22 210 lb (95.3 kg)      Other studies Reviewed: Additional studies/ records that were reviewed today include: TTE 10/22/22  Review of the above records today demonstrates:   1. Left ventricular ejection fraction, by estimation, is 60 to 65%. The  left ventricle has normal function. The left ventricle has no regional  wall motion abnormalities. There is moderate left ventricular hypertrophy.  Left ventricular diastolic  parameters are consistent with Grade I diastolic dysfunction (impaired  relaxation).   2. Right ventricular systolic function is normal. The right ventricular  size is moderately enlarged. There is mildly elevated pulmonary artery  systolic pressure.   3. Left atrial size was mildly dilated.   4. Right atrial size was moderately dilated.   5. The mitral valve is normal in structure. No evidence of mitral valve  regurgitation. No evidence of mitral stenosis.   6. The aortic valve is normal in structure. There is mild calcification  of the aortic valve. Aortic valve regurgitation is not visualized. Aortic  valve sclerosis is present, with no evidence of aortic valve stenosis.   7. The inferior vena  cava is normal in size with greater than 50%  respiratory variability, suggesting right  atrial pressure of 3 mmHg.    ASSESSMENT AND PLAN:  1.  Syncope: Presented to the emergency room.  She was found to not be orthostatic.  Her symptoms occur every few months.  She does not feel that they are related to standing up.  She has worn cardiac monitors without evidence of arrhythmia.  Despite this, she would likely benefit from ILR implant.  Risk and benefits of been discussed.  Risk include bleeding and infection.  She understands the risks and is agreed to the procedure.   Current medicines are reviewed at length with the patient today.   The patient does not have concerns regarding her medicines.  The following changes were made today:  none  Labs/ tests ordered today include:  No orders of the defined types were placed in this encounter.    Disposition:   FU with Destany Severns  pending ILR results  Signed, Journii Nierman Meredith Leeds, MD  12/06/2022 10:53 AM     [facility] [address] [place] [address] [phone] (office) [phone] (fax)  SURGEON:  Keesha Pellum Meredith Leeds, MD     PREPROCEDURE DIAGNOSIS:  Syncope    POSTPROCEDURE DIAGNOSIS: Syncope     PROCEDURES:   1. Implantable loop recorder implantation    INTRODUCTION:  SUZANNE ODOHERTY presents with a history of syncope The costs of loop recorder monitoring have been discussed with the patient. Appropriate time out was performed prior to the procedure.    DESCRIPTION OF PROCEDURE:  Informed written consent was obtained.  The patient required no sedation for the procedure today.  Mapping over the patient's chest was performed to identify the area where electrograms were most prominent for ILR recording.  This area was found to be the left parasternal region over the 4th intercostal space. The patients left chest was therefore prepped and draped in the usual sterile fashion. The skin overlying the left parasternal region was infiltrated with lidocaine for local analgesia.  A 0.5-cm incision  was made over the left parasternal region over the 3rd intercostal space.  A subcutaneous ILR pocket was fashioned using a combination of sharp and blunt dissection.  A Medtronic Reveal LINQ (serial # [id number]) implantable loop recorder was then placed into the pocket  R waves were very prominent and measured 51m.  Steri- Strips and a sterile dressing were then applied.  There were no early apparent complications.     CONCLUSIONS:   1. Successful implantation of a implantable loop recorder for a history of syncope  2. No early apparent complications.   Sharice Harriss MMeredith Leeds MD  12/06/2022 10:53 AM

## 2022-12-07 ENCOUNTER — Telehealth: Payer: Self-pay | Admitting: Pulmonary Disease

## 2022-12-07 DIAGNOSIS — G4733 Obstructive sleep apnea (adult) (pediatric): Secondary | ICD-10-CM

## 2022-12-07 NOTE — Telephone Encounter (Signed)
Received community message from [place], [place].  Lenna Sciara is requesting a new order for sleep study for patient to receive a new cpap machine.  Per Lenna Sciara- This patient will need a new sleep study before she can get a replacement cpap through insurance. Patient is aware.   There is some discussion of this possibility in telephone notes.  Thanks!  Melissa    Dr. Vaughan Browner, please advise on new home sleep study

## 2022-12-18 NOTE — Telephone Encounter (Signed)
Please order home sleep study.

## 2022-12-18 NOTE — Telephone Encounter (Signed)
Home sleep study has been ordered.   Nothing further needed.

## 2022-12-31 DIAGNOSIS — K219 Gastro-esophageal reflux disease without esophagitis: Secondary | ICD-10-CM | POA: Diagnosis not present

## 2022-12-31 DIAGNOSIS — M25811 Other specified joint disorders, right shoulder: Secondary | ICD-10-CM | POA: Diagnosis not present

## 2022-12-31 DIAGNOSIS — M7061 Trochanteric bursitis, right hip: Secondary | ICD-10-CM | POA: Diagnosis not present

## 2023-01-08 ENCOUNTER — Ambulatory Visit (INDEPENDENT_AMBULATORY_CARE_PROVIDER_SITE_OTHER): Payer: Medicare Other

## 2023-01-08 DIAGNOSIS — R55 Syncope and collapse: Secondary | ICD-10-CM

## 2023-01-09 ENCOUNTER — Encounter: Payer: Self-pay | Admitting: Nurse Practitioner

## 2023-01-09 ENCOUNTER — Ambulatory Visit (INDEPENDENT_AMBULATORY_CARE_PROVIDER_SITE_OTHER): Payer: Medicare Other | Admitting: Nurse Practitioner

## 2023-01-09 VITALS — BP 112/62 | HR 87 | Ht 71.0 in | Wt 210.0 lb

## 2023-01-09 DIAGNOSIS — K219 Gastro-esophageal reflux disease without esophagitis: Secondary | ICD-10-CM

## 2023-01-09 DIAGNOSIS — K625 Hemorrhage of anus and rectum: Secondary | ICD-10-CM

## 2023-01-09 LAB — CUP PACEART REMOTE DEVICE CHECK
Date Time Interrogation Session: 20240326173956
Implantable Pulse Generator Implant Date: 20240222

## 2023-01-09 MED ORDER — HYDROCORTISONE ACETATE 25 MG RE SUPP: 25.0000 mg | Freq: Every day | RECTAL | 1 refills | Status: DC

## 2023-01-09 MED ORDER — SUCRALFATE 1 G PO TABS: 1.0000 g | ORAL_TABLET | Freq: Two times a day (BID) | ORAL | 3 refills | Status: DC | PRN

## 2023-01-09 MED ORDER — DEXLANSOPRAZOLE 60 MG PO CPDR: 60.0000 mg | DELAYED_RELEASE_CAPSULE | Freq: Every day | ORAL | 3 refills | Status: DC

## 2023-01-09 NOTE — Progress Notes (Unsigned)
     01/09/2023 [name] [id number] [birth year]   Chief Complaint:  History of Present Illness:    She complains of having increased acid reflux despite taking Dexilant.   She is having less heartburn for the past 1 to 2 weeks since she started Sucralfate as needed.She takes every night and PRN. No dysphagia. Burning upper abd pain.   Loop recorder.  Heart issues   Takes crestor at bed time.  She had EGD in March 2022 per Dr. Everlene Farrier again with finding of 2 esophageal ulcers, the largest was 8 mm, noted to have a 5 cm hiatal hernia and a few cratered gastric ulcers the largest 10 mm.  She has been on both Dexilant and Carafate.   Intraoperative EGD in July 2022 negative for any evidence of persistent esophageal ulcer. Last colonoscopy 2019 with one 5 mm polyp removed which was a tubular adenoma, noted to have multiple diverticuli and internal hemorrhoids.  She has internal hemrrhoids, she had blood with bright red blood, stool if she strains.  Last 2 weeks. Goes away for 2 to 3 months. Strains.   EGD   Surgical [P], esophagus, GE junction - GASTRIC MUCOSA WITH SLIGHT CHRONIC INFLAMMATION. - NO INTESTINAL METAPLASIA, DYSPLASIA, OR CARCINOMA.  Colonoscopy 09/30/2018: - One 5 mm polyp in the transverse colon, removed with a cold snare. Resected and retrieved.  - Moderate diverticulosis in the sigmoid colon, in the descending colon, in the transverse colon, in the ascending colon and in the cecum. Surgical [P], transverse, polyp - TUBULAR ADENOMA (ONE). - NO HIGH GRADE DYSPLASIA OR MALIGNANCY      Latest Ref Rng & Units 10/22/2022    3:44 AM 10/21/2022    9:47 PM 10/04/2022   11:11 AM  CBC  WBC 4.0 - 10.5 K/uL 5.3  5.3  4.7   Hemoglobin 12.0 - 15.0 g/dL 12.1  13.1  13.0   Hematocrit 36.0 - 46.0 % 38.0  40.0  40.1   Platelets 150 - 400 K/uL 143  151  156      Current Medications, Allergies, Past Medical History, Past Surgical History, Family History and Social History  were reviewed in [employer] record.   Review of Systems:   Constitutional: Negative for fever, sweats, chills or weight loss.  Respiratory: Negative for shortness of breath.   Cardiovascular: Negative for chest pain, palpitations and leg swelling.  Gastrointestinal: See HPI.  Musculoskeletal: Negative for back pain or muscle aches.  Neurological: Negative for dizziness, headaches or paresthesias.    Physical Exam: BP 112/62   Pulse 87   Ht 5\' 11"  (1.803 m)   Wt 210 lb (95.3 kg)   BMI 29.29 kg/m  General: in no acute distress. Head: Normocephalic and atraumatic. Eyes: No scleral icterus. Conjunctiva pink . Ears: Normal auditory acuity. Mouth: Dentition intact. No ulcers or lesions.  Lungs: Clear throughout to auscultation. Heart: Regular rate and rhythm, no murmur. Abdomen: Soft, nontender and nondistended. No masses or hepatomegaly. Normal bowel sounds x 4 quadrants.  Rectal:  Musculoskeletal: Symmetrical with no gross deformities. Extremities: No edema. Neurological: Alert oriented x 4. No focal deficits.  Psychological: Alert and cooperative. Normal mood and affect  Assessment and Recommendations: ***

## 2023-01-09 NOTE — Patient Instructions (Addendum)
Apply a small amount of Desitin inside the anal opening and to the external anal area three times daily as needed for anal or hemorrhoidal irritation/bleeding.   Benefiber one tablespoon daily   Take Miralax 1 capful mixed in 8 ounces of water at bed time for constipation as tolerated.  Contact our office if your rectal bleeding worsens   Do not take Sucralfate within 2 to 4 hours of any other medication   Due to recent changes in healthcare laws, you may see the results of your imaging and laboratory studies on MyChart before your provider has had a chance to review them.  We understand that in some cases there may be results that are confusing or concerning to you. Not all laboratory results come back in the same time frame and the provider may be waiting for multiple results in order to interpret others.  Please give [place] 48 hours in order for your provider to thoroughly review all the results before contacting the office for clarification of your results.   Thank you for trusting me with your gastrointestinal care!   Carl Best, CRNP

## 2023-01-10 ENCOUNTER — Encounter: Payer: Self-pay | Admitting: Nurse Practitioner

## 2023-01-12 DIAGNOSIS — G4733 Obstructive sleep apnea (adult) (pediatric): Secondary | ICD-10-CM | POA: Diagnosis not present

## 2023-01-21 DIAGNOSIS — I10 Essential (primary) hypertension: Secondary | ICD-10-CM | POA: Diagnosis not present

## 2023-01-21 DIAGNOSIS — K219 Gastro-esophageal reflux disease without esophagitis: Secondary | ICD-10-CM | POA: Diagnosis not present

## 2023-01-21 DIAGNOSIS — H401112 Primary open-angle glaucoma, right eye, moderate stage: Secondary | ICD-10-CM | POA: Diagnosis not present

## 2023-01-21 DIAGNOSIS — E876 Hypokalemia: Secondary | ICD-10-CM | POA: Diagnosis not present

## 2023-01-21 DIAGNOSIS — E113213 Type 2 diabetes mellitus with mild nonproliferative diabetic retinopathy with macular edema, bilateral: Secondary | ICD-10-CM | POA: Diagnosis not present

## 2023-01-24 ENCOUNTER — Ambulatory Visit: Payer: Medicare Other | Attending: Cardiology | Admitting: Cardiology

## 2023-01-24 VITALS — BP 158/82 | HR 62 | Ht 70.5 in | Wt 208.0 lb

## 2023-01-24 DIAGNOSIS — Z79899 Other long term (current) drug therapy: Secondary | ICD-10-CM

## 2023-01-24 DIAGNOSIS — E782 Mixed hyperlipidemia: Secondary | ICD-10-CM

## 2023-01-24 DIAGNOSIS — G4733 Obstructive sleep apnea (adult) (pediatric): Secondary | ICD-10-CM

## 2023-01-24 DIAGNOSIS — R001 Bradycardia, unspecified: Secondary | ICD-10-CM | POA: Diagnosis not present

## 2023-01-24 DIAGNOSIS — R55 Syncope and collapse: Secondary | ICD-10-CM

## 2023-01-24 NOTE — Patient Instructions (Signed)
Medication Instructions:  Please take your blood pressure daily for 2 weeks and send in a MyChart message. Please include heart rates.   HOW TO TAKE YOUR BLOOD PRESSURE: Rest 5 minutes before taking your blood pressure. Don't smoke or drink caffeinated beverages for at least 30 minutes before. Take your blood pressure before (not after) you eat. Sit comfortably with your back supported and both feet on the floor (don't cross your legs). Elevate your arm to heart level on a table or a desk. Use the proper sized cuff. It should fit smoothly and snugly around your bare upper arm. There should be enough room to slip a fingertip under the cuff. The bottom edge of the cuff should be 1 inch above the crease of the elbow. Ideally, take 3 measurements at one sitting and record the average.  *If you need a refill on your cardiac medications before your next appointment, please call your pharmacy*   Lab Work: Your physician recommends that you have labs drawn today: BMET, Mag  If you have labs (blood work) drawn today and your tests are completely normal, you will receive your results only by: MyChart Message (if you have MyChart) OR A paper copy in the mail If you have any lab test that is abnormal or we need to change your treatment, we will call you to review the results.   Testing/Procedures: None   Follow-Up: At [facility], you and your health needs are our priority.  As part of our continuing mission to provide you with exceptional heart care, we have created designated Provider Care Teams.  These Care Teams include your primary Cardiologist (physician) and Advanced Practice Providers (APPs -  Physician Assistants and Nurse Practitioners) who all work together to provide you with the care you need, when you need it.  Your next appointment:   12-16 week(s)  Provider:   Thomasene Ripple, DO    Other instructions: Please purchase an abdominal binder. Wear it while awake, remove  it to sleep.

## 2023-01-24 NOTE — Progress Notes (Unsigned)
Cardiology Office Note:    Date:  01/26/2023   ID:  [name], DOB 1941-01-12, MRN [id number]  PCP:  Lewis Moccasinewey, Elizabeth R, MD  Cardiologist:  Thomasene RippleKardie Sheddrick Lattanzio, DO  Electrophysiologist:  None   Referring MD: Lewis Moccasinewey, Elizabeth R, MD   " I am having shortness of breath"   History of Present Illness:    [name] is a 82 y.o. female with a hx of hypertension, occasional PVC , asthma, anxiety, GERD, diabetes mellitus, fibromyalgia here today for follow-up visit.  The patient previously followed with Dr. Allyson SabalBerry and has requested to transition her care to me. She was seen by Dr. Gery PrayBarry in September 2023 at that time no medication changes were made.  She has since been hospitalized.  She is here today to follow-up visit because she noted that she was recently in the hospital where she was told that she had dilated heart and she is concerned about that.  She reports that she has been experiencing some shortness of breath on exertion.  This is getting worse.  She also tells me that she is feeling a mass in her neck that mostly is movable.   Chart review she was seen at [facility] due to being positive for COVID. She was recently admitted to the hospital for 1 day on 05/21/2022 with fatigue, dizziness and dehydration.  She was also mildly bradycardic.  Since that time her hydrochlorothiazide was discontinued, her losartan uptitrated and she feels clinically improved.  She does have a remote history of normal cardiac catheterization by Dr. Elsie LincolnGamble  03/14/1992, coronary CTA performed 12/04/2021 that revealed a coronary calcium score of 0 with normal coronary arteries and a normal 2D echo.  A recent event monitor showed essentially normal rhythm with occasional PVCs, PACs and short runs of SVT with a mean heart rate of 63.   I saw the patient on October 11, 2022.  At that time she had just been discharged from the hospital.  I started her on Lasix given she did have some fluid overload and got her  echocardiogram.  At her last visit on October 29, 2022 review her echocardiogram as well as she has still had some syncope episode her refer the patient to EP for loop recorder.  She is status post loop recorder.  No syncope since her device placement.  In terms of orthostatic hypotension I advised the patient to get abdominal binder as well as the support stocking.  She had will have difficulty with the supports that given her arthritis and does not have anyone at home to help her with this.  She get the abdominal binder did not actually fit her size.  She is here in office with her daughter.  Past Medical History:  Diagnosis Date   Allergy    Anal or rectal pain    sometimes   Anemia    hx of during pregnancy   Anxiety    Arthritis    Asthma    hx of   Bradycardia    " I KNOW I HAVE BRADYCARDIA ESPECIALLY WHEN I SLEEP"    Cataract 2021   bilateral eyes   Chronic back pain    Degenerative joint disease    osteo   Depression    Diabetes mellitus without complication (HCC)    DM type II   Diverticulosis 2003   Dysrhythmia    hx of  due to eye drop and also low heart rate 40's per pt. Dr. Allyson SabalBerry follows  Elevated total protein    Esophageal dysmotility    Fibromyalgia    GERD (gastroesophageal reflux disease)    subsequent Nissen Fundoplication   Glaucoma    Hearing loss    Heart murmur    "was told she had a heart murmur"   Hemorrhoids    Hiatal hernia 11/08/2009   Hx of adenomatous colonic polyps 07/02/2002   Hypercalcemia    Hyperlipidemia    Hyperlipidemia    Hypertension    Implantable loop recorder present 11/28/2021   Nausea    Osteoporosis    PONV (postoperative nausea and vomiting)    Rectal bleeding    from hemorrhoids.     Sleep apnea    DOES USE CPAP    Thrombocytopenia (HCC)    Varicose veins of left lower extremity     Past Surgical History:  Procedure Laterality Date   CARDIAC CATHETERIZATION  03/14/1992   Normal cardiac cath. Normal LV  function.   CARDIOVASCULAR STRESS TEST  01/22/2011   No scintigraphic evidence of inducible ischemia.   CAROTID DOPPLER  03/31/2007   Bilateral ICAs - no evidence of significant diameter reduction, dissectin, tortuosity, FMD, or any other vascular abnormality.   CATARACT EXTRACTION, BILATERAL     ESOPHAGEAL MANOMETRY N/A 11/14/2015   Procedure: ESOPHAGEAL MANOMETRY (EM);  Surgeon: Napoleon Form, MD;  Location: [facility];  Service: Endoscopy;  Laterality: N/A;   ESOPHAGEAL MANOMETRY N/A 01/18/2021   Procedure: ESOPHAGEAL MANOMETRY (EM);  Surgeon: Napoleon Form, MD;  Location: [facility];  Service: Endoscopy;  Laterality: N/A;   ESOPHAGOGASTRODUODENOSCOPY N/A 03/08/2021   Procedure: ESOPHAGOGASTRODUODENOSCOPY (EGD);  Surgeon: Corliss Skains, MD;  Location[facility] OR;  Service: Thoracic;  Laterality: N/A;   EYE SURGERY     bilateral cataracts; bilateral stents   GASTRIC RESECTION  2009   GLAUCOMA SURGERY     JOINT REPLACEMENT     right knee Dr. Lequita Halt 06-23-18   KNEE SURGERY Bilateral    NISSEN FUNDOPLICATION  2000   with subsequent takedown in 2009   TOTAL KNEE ARTHROPLASTY Left 06/10/2017   Procedure: LEFT  TOTAL KNEE ARTHROPLASTY;  Surgeon: Ollen Gross, MD;  Location: [facility] ORS;  Service: Orthopedics;  Laterality: Left;  Adductor Block   TOTAL KNEE ARTHROPLASTY Right 06/23/2018   Procedure: RIGHT TOTAL KNEE ARTHROPLASTY;  Surgeon: Ollen Gross, MD;  Location: [facility] ORS;  Service: Orthopedics;  Laterality: Right;   TRANSTHORACIC ECHOCARDIOGRAM  12/21/2010   EF 60%, moderate LVH,    TUBAL LIGATION     XI ROBOTIC ASSISTED HIATAL HERNIA REPAIR N/A 03/08/2021   Procedure: XI ROBOTIC ASSISTED LAPAROSCOPY WITH LYSIS OF ADHESIONS;  Surgeon: Corliss Skains, MD;  Location: [facility] OR;  Service: Thoracic;  Laterality: N/A;    Current Medications: Current Meds  Medication Sig   acetaminophen (TYLENOL) 500 MG tablet Take 1,000 mg by mouth 2 (two) times daily as needed for headache (back and  knee pain).   albuterol (VENTOLIN HFA) 108 (90 Base) MCG/ACT inhaler Inhale 1 puff into the lungs as needed for wheezing or shortness of breath.   ascorbic acid (VITAMIN C) 500 MG tablet Take 1 tablet (500 mg total) by mouth daily.   brimonidine (ALPHAGAN) 0.2 % ophthalmic solution 1 drop 2 (two) times daily.   cetirizine (ZYRTEC) 10 MG tablet Take 10 mg by mouth at bedtime.   Cholecalciferol (VITAMIN D3) 250 MCG (10000 UT) capsule Take 10,000 Units by mouth daily.   dexlansoprazole (DEXILANT) 60 MG capsule Take 1 capsule (60  mg total) by mouth daily.   furosemide (LASIX) 20 MG tablet Take 1 tablet (20 mg total) by mouth once a week.   hydrocortisone (ANUSOL-HC) 25 MG suppository Place 1 suppository (25 mg total) rectally at bedtime.   losartan (COZAAR) 25 MG tablet Take 25 mg by mouth 2 (two) times daily.   montelukast (SINGULAIR) 10 MG tablet Take 10 mg by mouth at bedtime.   nystatin ointment (MYCOSTATIN) Apply 1 application  topically See admin instructions. 1 application 1-2 times a day as needed for rash   Polyethyl Glycol-Propyl Glycol (SYSTANE OP) Place 1 drop into both eyes daily as needed (dry/irritated eyes).   potassium chloride (KLOR-CON) 10 MEQ tablet Take 1 tablet (10 mEq total) by mouth once a week.   rosuvastatin (CRESTOR) 40 MG tablet Take 40 mg by mouth at bedtime.   sertraline (ZOLOFT) 100 MG tablet Take 100 mg by mouth every morning.   sucralfate (CARAFATE) 1 g tablet Take 1 tablet (1 g total) by mouth 2 (two) times daily as needed. Do not take within 2 hours of any other medications.   zinc sulfate 220 (50 Zn) MG capsule Take 1 capsule (220 mg total) by mouth daily.     Allergies:   Adalat [nifedipine], Hydrocodone, Meperidine hcl, and Naproxen   Social History   Socioeconomic History   Marital status: Married    Spouse name: Not on file   Number of children: 8   Years of education: Not on file   Highest education level: Not on file  Occupational History    Occupation: retired    Comment: retired Clinical biochemist.   Tobacco Use   Smoking status: Never   Smokeless tobacco: Never  Vaping Use   Vaping Use: Never used  Substance and Sexual Activity   Alcohol use: No    Alcohol/week: 0.0 standard drinks of alcohol   Drug use: No   Sexual activity: Yes  Other Topics Concern   Not on file  Social History Narrative   Husband, Yared Susan is Next of Kin. Cell # [phone]   Social Determinants of Health   Financial Resource Strain: Not on file  Food Insecurity: Not on file  Transportation Needs: Not on file  Physical Activity: Not on file  Stress: Not on file  Social Connections: Not on file     Family History: The patient's family history includes Breast cancer in her daughter; Cancer in her maternal grandmother; Diabetes in her father and mother; Heart disease in her mother; Hypertension in her mother; Kidney disease in her father and maternal grandfather; Leukemia in her maternal uncle; Prostate cancer in her maternal uncle; Stomach cancer in her maternal aunt; Stroke in her brother; Uterine cancer in her maternal aunt. There is no history of Colon cancer, Rectal cancer, or Esophageal cancer.  ROS:   Review of Systems  Constitution: Negative for decreased appetite, fever and weight gain.  HENT: Negative for congestion, ear discharge, hoarse voice and sore throat.   Eyes: Negative for discharge, redness, vision loss in right eye and visual halos.  Cardiovascular: Reports dyspnea exertion.  Negative for chest pain, leg swelling, orthopnea and palpitations.  Respiratory: Negative for cough, hemoptysis, shortness of breath and snoring.   Endocrine: Negative for heat intolerance and polyphagia.  Hematologic/Lymphatic: Negative for bleeding problem. Does not bruise/bleed easily.  Skin: Negative for flushing, nail changes, rash and suspicious lesions.  Musculoskeletal: Negative for arthritis, joint pain, muscle cramps, myalgias, neck pain and  stiffness.  Gastrointestinal: Negative for  abdominal pain, bowel incontinence, diarrhea and excessive appetite.  Genitourinary: Negative for decreased libido, genital sores and incomplete emptying.  Neurological: Negative for brief paralysis, focal weakness, headaches and loss of balance.  Psychiatric/Behavioral: Negative for altered mental status, depression and suicidal ideas.  Allergic/Immunologic: Negative for HIV exposure and persistent infections.    EKGs/Labs/Other Studies Reviewed:    The following studies were reviewed today:   EKG:  The ekg ordered today demonstrates sinus rhythm, heart rate 60 bpm  CTA of the chest December 2023  IMPRESSION: 1. No evidence of pulmonary embolism. 2. Suspect mild pulmonary interstitial edema. 3. Borderline enlarged main pulmonary artery suggesting pulmonary hypertension. Additionally, there is cardiomegaly with reflux of contrast into the IVC/hepatic veins suggesting right heart dysfunction. 4. Unchanged hiatal hernia.     Electronically Signed   By: Lesia Hausen M.D.   On: 10/04/2022 14:03    Coronary CTA February 2023 FINDINGS: Non-cardiac: See separate report from [facility]. No significant findings on limited lung and soft tissue windows.   Calcium Score: No calcium noted in coronary arteries   Coronary Arteries: Right dominant with no anomalies   LM: Normal   LAD: Normal   D1: Normal   D2: Normal   Circumflex: Normal   OM1: Normal   OM2: Normal   RCA: Normal   PDA: Normal   PLA: Normal   IMPRESSION: 1. Calcium score 0   2.  Normal right dominant coronary arteries   3.  Normal ascending thoracic aorta 3.6 cm   Charlton Haws   Electronically Signed: By: Charlton Haws M.D. On: 12/04/2021 12:58    Echocardiogram February 2023 IMPRESSIONS   1. Left ventricular ejection fraction, by estimation, is 60 to 65%. The  left ventricle has normal function. The left ventricle has no regional  wall  motion abnormalities. There is mild concentric left ventricular  hypertrophy. Left ventricular diastolic  parameters are consistent with Grade I diastolic dysfunction (impaired  relaxation).   2. Right ventricular systolic function is normal. The right ventricular  size is normal.   3. Left atrial size was mild to moderately dilated.   4. The mitral valve is normal in structure. Trivial mitral valve  regurgitation. No evidence of mitral stenosis.   5. The aortic valve is tricuspid. Aortic valve regurgitation is not  visualized. No aortic stenosis is present.   6. Aortic dilatation noted. There is borderline dilatation of the  ascending aorta, measuring 36 mm.   7. The inferior vena cava is normal in size with greater than 50%  respiratory variability, suggesting right atrial pressure of 3 mmHg.   Recent Labs: 10/11/2022: NT-Pro BNP 181 10/21/2022: B Natriuretic Peptide 74.6 10/22/2022: ALT 10; Hemoglobin 12.1; Platelets 143; TSH 0.303 01/24/2023: BUN 21; Creatinine, Ser 1.06; Magnesium 2.2; Potassium 4.8; Sodium 141  Recent Lipid Panel    Component Value Date/Time   CHOL 200 (H) 10/26/2020 0942   TRIG 46 10/26/2020 0942   HDL 76 10/26/2020 0942   CHOLHDL 2.6 10/26/2020 0942   CHOLHDL 3.2 07/24/2020 2044   VLDL 13 07/24/2020 2044   LDLCALC 115 (H) 10/26/2020 0942    Physical Exam:    VS:  BP (!) 158/82   Pulse 62   Ht 5' 10.5" (1.791 m)   Wt 208 lb (94.3 kg)   SpO2 96%   BMI 29.42 kg/m     Wt Readings from Last 3 Encounters:  01/24/23 208 lb (94.3 kg)  01/09/23 210 lb (95.3 kg)  12/06/22 215  lb 6.4 oz (97.7 kg)     GEN: Well nourished, well developed in no acute distress HEENT: Normal NECK: Noted JVD; No carotid bruits, there is also suspicion of a small likely fat pad/cyst on palpation of the right neck LYMPHATICS: No lymphadenopathy CARDIAC: S1S2 noted,RRR, no murmurs, rubs, gallops RESPIRATORY:  Clear to auscultation without rales, wheezing or rhonchi  ABDOMEN:  Soft, non-tender, non-distended, +bowel sounds, no guarding. EXTREMITIES: +2 bilateral lower extremity edema edema, No cyanosis, no clubbing MUSCULOSKELETAL:  No deformity  SKIN: Warm and dry NEUROLOGIC:  Alert and oriented x 3, non-focal PSYCHIATRIC:  Normal affect, good insight  ASSESSMENT:    1. Medication management   2. Sinus bradycardia   3. Obstructive sleep apnea syndrome   4. Mixed hyperlipidemia   5. Syncope, unspecified syncope type     PLAN:    She is hypertensive in the office today.  Manually for me blood pressure 158/82 mmHg.  She reports very lower blood pressure at home and also in the setting of her orthostatic hypotension I would prefer not to increase her antihypertensive medication right now to avoid emetogenic hypotension.  Heart recent device implantable loop recorder interrogation did not show any evidence of any arrhythmia.  I shared this information with the patient as well  The patient understands the need to lose weight with diet and exercise. We have discussed specific strategies for this.   The patient is in agreement with the above plan. The patient left the office in stable condition.  The patient will follow up in 6 weeks or sooner if needed.   Medication Adjustments/Labs and Tests Ordered: Current medicines are reviewed at length with the patient today.  Concerns regarding medicines are outlined above.  Orders Placed This Encounter  Procedures   Basic Metabolic Panel (BMET)   Magnesium   No orders of the defined types were placed in this encounter.   Patient Instructions  Medication Instructions:  Please take your blood pressure daily for 2 weeks and send in a MyChart message. Please include heart rates.   HOW TO TAKE YOUR BLOOD PRESSURE: Rest 5 minutes before taking your blood pressure. Don't smoke or drink caffeinated beverages for at least 30 minutes before. Take your blood pressure before (not after) you eat. Sit comfortably with  your back supported and both feet on the floor (don't cross your legs). Elevate your arm to heart level on a table or a desk. Use the proper sized cuff. It should fit smoothly and snugly around your Bates upper arm. There should be enough room to slip a fingertip under the cuff. The bottom edge of the cuff should be 1 inch above the crease of the elbow. Ideally, take 3 measurements at one sitting and record the average.  *If you need a refill on your cardiac medications before your next appointment, please call your pharmacy*   Lab Work: Your physician recommends that you have labs drawn today: BMET, Mag  If you have labs (blood work) drawn today and your tests are completely normal, you will receive your results only by: MyChart Message (if you have MyChart) OR A paper copy in the mail If you have any lab test that is abnormal or we need to change your treatment, we will call you to review the results.   Testing/Procedures: None   Follow-Up: At [facility], you and your health needs are our priority.  As part of our continuing mission to provide you with exceptional heart care, we have  created designated Provider Care Teams.  These Care Teams include your primary Cardiologist (physician) and Advanced Practice Providers (APPs -  Physician Assistants and Nurse Practitioners) who all work together to provide you with the care you need, when you need it.  Your next appointment:   12-16 week(s)  Provider:   Thomasene Ripple, DO    Other instructions: Please purchase an abdominal binder. Wear it while awake, remove it to sleep.    Adopting a Healthy Lifestyle.  Know what a healthy weight is for you (roughly BMI <25) and aim to maintain this   Aim for 7+ servings of fruits and vegetables daily   65-80+ fluid ounces of water or unsweet tea for healthy kidneys   Limit to max 1 drink of alcohol per day; avoid smoking/tobacco   Limit animal fats in diet for cholesterol and heart  health - choose grass fed whenever available   Avoid highly processed foods, and foods high in saturated/trans fats   Aim for low stress - take time to unwind and care for your mental health   Aim for 150 min of moderate intensity exercise weekly for heart health, and weights twice weekly for bone health   Aim for 7-9 hours of sleep daily   When it comes to diets, agreement about the perfect plan isnt easy to find, even among the experts. Experts at the [facility] [employer] developed an idea known as the Healthy Eating Plate. Just imagine a plate divided into logical, healthy portions.   The emphasis is on diet quality:   Load up on vegetables and fruits - one-half of your plate: Aim for color and variety, and remember that potatoes dont count.   Go for whole grains - one-quarter of your plate: Whole wheat, barley, wheat berries, quinoa, oats, brown rice, and foods made with them. If you want pasta, go with whole wheat pasta.   Protein power - one-quarter of your plate: Fish, chicken, beans, and nuts are all healthy, versatile protein sources. Limit red meat.   The diet, however, does go beyond the plate, offering a few other suggestions.   Use healthy plant oils, such as olive, canola, soy, corn, sunflower and peanut. Check the labels, and avoid partially hydrogenated oil, which have unhealthy trans fats.   If youre thirsty, drink water. Coffee and tea are good in moderation, but skip sugary drinks and limit milk and dairy products to one or two daily servings.   The type of carbohydrate in the diet is more important than the amount. Some sources of carbohydrates, such as vegetables, fruits, whole grains, and beans-are healthier than others.   Finally, stay active  Signed, Thomasene Ripple, DO  01/26/2023 4:36 PM    [facility]

## 2023-01-25 LAB — BASIC METABOLIC PANEL
BUN/Creatinine Ratio: 20 (ref 12–28)
BUN: 21 mg/dL (ref 8–27)
CO2: 25 mmol/L (ref 20–29)
Calcium: 9.6 mg/dL (ref 8.7–10.3)
Chloride: 102 mmol/L (ref 96–106)
Creatinine, Ser: 1.06 mg/dL — ABNORMAL HIGH (ref 0.57–1.00)
Glucose: 113 mg/dL — ABNORMAL HIGH (ref 70–99)
Potassium: 4.8 mmol/L (ref 3.5–5.2)
Sodium: 141 mmol/L (ref 134–144)
eGFR: 53 mL/min/{1.73_m2} — ABNORMAL LOW (ref >59)

## 2023-01-25 LAB — MAGNESIUM: Magnesium: 2.2 mg/dL (ref 1.6–2.3)

## 2023-01-28 ENCOUNTER — Ambulatory Visit (INDEPENDENT_AMBULATORY_CARE_PROVIDER_SITE_OTHER): Payer: Medicare Other

## 2023-01-28 ENCOUNTER — Ambulatory Visit (INDEPENDENT_AMBULATORY_CARE_PROVIDER_SITE_OTHER): Payer: Medicare Other | Admitting: Podiatry

## 2023-01-28 ENCOUNTER — Encounter: Payer: Self-pay | Admitting: Podiatry

## 2023-01-28 DIAGNOSIS — I739 Peripheral vascular disease, unspecified: Secondary | ICD-10-CM | POA: Diagnosis not present

## 2023-01-28 DIAGNOSIS — M79674 Pain in right toe(s): Secondary | ICD-10-CM | POA: Diagnosis not present

## 2023-01-28 DIAGNOSIS — M79675 Pain in left toe(s): Secondary | ICD-10-CM

## 2023-01-28 DIAGNOSIS — M722 Plantar fascial fibromatosis: Secondary | ICD-10-CM | POA: Diagnosis not present

## 2023-01-28 DIAGNOSIS — M76821 Posterior tibial tendinitis, right leg: Secondary | ICD-10-CM | POA: Diagnosis not present

## 2023-01-28 DIAGNOSIS — M2141 Flat foot [pes planus] (acquired), right foot: Secondary | ICD-10-CM

## 2023-01-28 DIAGNOSIS — M76822 Posterior tibial tendinitis, left leg: Secondary | ICD-10-CM

## 2023-01-28 DIAGNOSIS — B351 Tinea unguium: Secondary | ICD-10-CM | POA: Diagnosis not present

## 2023-01-28 DIAGNOSIS — M2142 Flat foot [pes planus] (acquired), left foot: Secondary | ICD-10-CM

## 2023-01-28 NOTE — Progress Notes (Signed)
Subjective:  Patient ID: Gilford Silvius, female    DOB: 04-22-1941,   MRN: [id number]  Chief Complaint  Patient presents with   Foot Pain    Bilateral foot pain pt stated that she would like to have her nails trimmed as well     82 y.o. female presents for concern as above. Relates she has continued to have pain in both feet. Does not do a lot of a walking but when she does it is painful. Relates she has had orthotics in the past but has been 20+ years. She has a history of PAD and requesting to have nails trimmed today.  . Denies any other pedal complaints. Denies n/v/f/c.   Past Medical History:  Diagnosis Date   Allergy    Anal or rectal pain    sometimes   Anemia    hx of during pregnancy   Anxiety    Arthritis    Asthma    hx of   Bradycardia    " I KNOW I HAVE BRADYCARDIA ESPECIALLY WHEN I SLEEP"    Cataract 2021   bilateral eyes   Chronic back pain    Degenerative joint disease    osteo   Depression    Diabetes mellitus without complication    DM type II   Diverticulosis 2003   Dysrhythmia    hx of  due to eye drop and also low heart rate 40's per pt. Dr. Allyson Sabal follows   Elevated total protein    Esophageal dysmotility    Fibromyalgia    GERD (gastroesophageal reflux disease)    subsequent Nissen Fundoplication   Glaucoma    Hearing loss    Heart murmur    "was told she had a heart murmur"   Hemorrhoids    Hiatal hernia 11/08/2009   Hx of adenomatous colonic polyps 07/02/2002   Hypercalcemia    Hyperlipidemia    Hyperlipidemia    Hypertension    Implantable loop recorder present 11/28/2021   Nausea    Osteoporosis    PONV (postoperative nausea and vomiting)    Rectal bleeding    from hemorrhoids.     Sleep apnea    DOES USE CPAP    Thrombocytopenia    Varicose veins of left lower extremity     Objective:  Physical Exam: Vascular: DP/PT pulses 1/4 bilateral. CFT <3 seconds. Normal hair growth on digits. No edema.  Skin. No lacerations or  abrasions bilateral feet. Nails 1-5 bilateral are thickened elongated and with subungual debris.  Musculoskeletal: MMT 5/5 bilateral lower extremities in DF, PF, Inversion and Eversion. Deceased ROM in DF of ankle joint. Collapse of medial arch bilateral with eversion of calcaneus. Too many toes sign and unable to perform single limb heel rise. Tender of the insertion and proximally along the PT tendon.  Neurological: Sensation intact to light touch.   Assessment:   1. Posterior tibial tendon dysfunction (PTTD) of both lower extremities   2. Bilateral pes planus   3. Pain due to onychomycosis of toenails of both feet   4. PVD (peripheral vascular disease)      Plan:  Patient was evaluated and treated and all questions answered. -Discussed and educated patient on foot care, especially with  regards to the vascular, neurological and musculoskeletal systems.  -Discussed supportive shoes at all times and checking feet regularly.  -Mechanically debrided all nails 1-5 bilateral using sterile nail nipper and filed with dremel without incident  X-rays reviewed and discussed with patient.  Discussed PTTD diagnosis and treatment options with patient. Stretching exercises discussed and handout dispensed. Discussed CMO and fitted today -Answered all patient questions -Patient to return  in 3 months for at risk foot care -Patient advised to call the office if any problems or questions arise in the meantime.   Louann Sjogren, DPM

## 2023-01-29 NOTE — Progress Notes (Signed)
Reviewed and agree with documentation and assessment and plan. K. Veena Baron Parmelee , MD   

## 2023-01-30 ENCOUNTER — Telehealth: Payer: Self-pay | Admitting: Pulmonary Disease

## 2023-01-30 ENCOUNTER — Ambulatory Visit (INDEPENDENT_AMBULATORY_CARE_PROVIDER_SITE_OTHER): Payer: Medicare Other

## 2023-01-30 DIAGNOSIS — G4733 Obstructive sleep apnea (adult) (pediatric): Secondary | ICD-10-CM | POA: Diagnosis not present

## 2023-01-30 NOTE — Telephone Encounter (Signed)
Call patient  Sleep study result  Date of study: 01/12/2023  Impression: Severe obstructive sleep apnea with moderately severe oxygen desaturations  Recommendation: DME referral  Recommend CPAP therapy for severe obstructive sleep apnea  Auto titrating CPAP with pressure settings of 5-20 will be appropriate  Encourage weight loss measures  Follow-up in the office 4 to 6 weeks following initiation of treatment

## 2023-02-04 ENCOUNTER — Other Ambulatory Visit: Payer: Self-pay

## 2023-02-04 ENCOUNTER — Encounter: Payer: Self-pay | Admitting: Cardiology

## 2023-02-04 DIAGNOSIS — G4733 Obstructive sleep apnea (adult) (pediatric): Secondary | ICD-10-CM

## 2023-02-04 NOTE — Telephone Encounter (Signed)
Spoke with patient regarding sleep study result's per Dr.Olalere   Call patient   Sleep study result   Date of study: 01/12/2023   Impression: Severe obstructive sleep apnea with moderately severe oxygen desaturations   Recommendation: DME referral   Recommend CPAP therapy for severe obstructive sleep apnea   Auto titrating CPAP with pressure settings of 5-20 will be appropriate   Encourage weight loss measures   Follow-up in the office 4 to 6 weeks following initiation of treatment    Patient was set up for a f/u and DME order was placed.  Patient's voice was understanding.Nothing else further needed.

## 2023-02-06 DIAGNOSIS — D2339 Other benign neoplasm of skin of other parts of face: Secondary | ICD-10-CM | POA: Diagnosis not present

## 2023-02-06 DIAGNOSIS — D485 Neoplasm of uncertain behavior of skin: Secondary | ICD-10-CM | POA: Diagnosis not present

## 2023-02-06 DIAGNOSIS — L738 Other specified follicular disorders: Secondary | ICD-10-CM | POA: Diagnosis not present

## 2023-02-11 ENCOUNTER — Ambulatory Visit (INDEPENDENT_AMBULATORY_CARE_PROVIDER_SITE_OTHER): Payer: Medicare Other

## 2023-02-11 DIAGNOSIS — R55 Syncope and collapse: Secondary | ICD-10-CM | POA: Diagnosis not present

## 2023-02-11 LAB — CUP PACEART REMOTE DEVICE CHECK
Date Time Interrogation Session: 20240428173612
Implantable Pulse Generator Implant Date: 20240222

## 2023-02-19 DIAGNOSIS — E559 Vitamin D deficiency, unspecified: Secondary | ICD-10-CM | POA: Diagnosis not present

## 2023-02-19 DIAGNOSIS — H43393 Other vitreous opacities, bilateral: Secondary | ICD-10-CM | POA: Diagnosis not present

## 2023-02-19 DIAGNOSIS — I10 Essential (primary) hypertension: Secondary | ICD-10-CM | POA: Diagnosis not present

## 2023-02-19 DIAGNOSIS — H401133 Primary open-angle glaucoma, bilateral, severe stage: Secondary | ICD-10-CM | POA: Diagnosis not present

## 2023-02-19 DIAGNOSIS — E113213 Type 2 diabetes mellitus with mild nonproliferative diabetic retinopathy with macular edema, bilateral: Secondary | ICD-10-CM | POA: Diagnosis not present

## 2023-02-19 DIAGNOSIS — H43823 Vitreomacular adhesion, bilateral: Secondary | ICD-10-CM | POA: Diagnosis not present

## 2023-02-19 DIAGNOSIS — E782 Mixed hyperlipidemia: Secondary | ICD-10-CM | POA: Diagnosis not present

## 2023-02-19 DIAGNOSIS — E1165 Type 2 diabetes mellitus with hyperglycemia: Secondary | ICD-10-CM | POA: Diagnosis not present

## 2023-02-19 NOTE — Progress Notes (Signed)
[facility] Summary Report / Loop Recorder 

## 2023-02-26 DIAGNOSIS — E11319 Type 2 diabetes mellitus with unspecified diabetic retinopathy without macular edema: Secondary | ICD-10-CM | POA: Diagnosis not present

## 2023-02-26 DIAGNOSIS — E782 Mixed hyperlipidemia: Secondary | ICD-10-CM | POA: Diagnosis not present

## 2023-02-26 DIAGNOSIS — E559 Vitamin D deficiency, unspecified: Secondary | ICD-10-CM | POA: Diagnosis not present

## 2023-02-26 DIAGNOSIS — E1165 Type 2 diabetes mellitus with hyperglycemia: Secondary | ICD-10-CM | POA: Diagnosis not present

## 2023-02-26 DIAGNOSIS — I10 Essential (primary) hypertension: Secondary | ICD-10-CM | POA: Diagnosis not present

## 2023-03-07 ENCOUNTER — Ambulatory Visit (INDEPENDENT_AMBULATORY_CARE_PROVIDER_SITE_OTHER): Payer: Medicare Other | Admitting: Pulmonary Disease

## 2023-03-07 ENCOUNTER — Encounter: Payer: Self-pay | Admitting: Pulmonary Disease

## 2023-03-07 VITALS — BP 130/80 | HR 58 | Ht 70.5 in | Wt 216.8 lb

## 2023-03-07 DIAGNOSIS — G4733 Obstructive sleep apnea (adult) (pediatric): Secondary | ICD-10-CM | POA: Diagnosis not present

## 2023-03-07 NOTE — Patient Instructions (Signed)
I will see you in about 6 months  Continue using your CPAP  Use your CPAP nightly  If you are able to contact the medical supply company, they might be able to walk you through how to change the humidification in the machine  Call us with any other significant concerns

## 2023-03-07 NOTE — Progress Notes (Signed)
[name]    [id number]    [birth year]  Primary Care Physician:[name], [name], MD  Referring Physician: [name], MD [address] [place],  [place] [address]  Chief complaint:   Patient is being seen for obstructive sleep apnea  HPI:  Diagnosed with obstructive sleep apnea and has been using CPAP Continues to benefit from CPAP use Recently had a repeat home sleep study showing she still has severe obstructive sleep apnea  Feels she is sleeping well at night  Has a history of chronic cough, following up with Dr. Isaiah Bates  Has been using CPAP for many years diagnosed in 1999, was using CPAP regularly, was lost to follow-up Has been using CPAP with no issues She is on auto CPAP 5-20 Wakes up feeling rested   Outpatient Encounter Medications as of 03/07/2023  Medication Sig   acetaminophen (TYLENOL) 500 MG tablet Take 1,000 mg by mouth 2 (two) times daily as needed for headache (back and knee pain).   albuterol (VENTOLIN HFA) 108 (90 Base) MCG/ACT inhaler Inhale 1 puff into the lungs as needed for wheezing or shortness of breath.   ascorbic acid (VITAMIN C) 500 MG tablet Take 1 tablet (500 mg total) by mouth daily.   brimonidine (ALPHAGAN) 0.2 % ophthalmic solution 1 drop 2 (two) times daily.   cetirizine (ZYRTEC) 10 MG tablet Take 10 mg by mouth at bedtime.   Cholecalciferol (VITAMIN D3) 250 MCG (10000 UT) capsule Take 10,000 Units by mouth daily.   dexlansoprazole (DEXILANT) 60 MG capsule Take 1 capsule (60 mg total) by mouth daily.   furosemide (LASIX) 20 MG tablet Take 1 tablet (20 mg total) by mouth once a week.   hydrocortisone (ANUSOL-HC) 25 MG suppository Place 1 suppository (25 mg total) rectally at bedtime.   losartan (COZAAR) 25 MG tablet Take 25 mg by mouth 2 (two) times daily.   montelukast (SINGULAIR) 10 MG tablet Take 10 mg by mouth at bedtime.   nystatin ointment (MYCOSTATIN) Apply 1 application  topically See admin instructions. 1  application 1-2 times a day as needed for rash   Polyethyl Glycol-Propyl Glycol (SYSTANE OP) Place 1 drop into both eyes daily as needed (dry/irritated eyes).   potassium chloride (KLOR-CON) 10 MEQ tablet Take 1 tablet (10 mEq total) by mouth once a week.   rosuvastatin (CRESTOR) 40 MG tablet Take 40 mg by mouth at bedtime.   sertraline (ZOLOFT) 100 MG tablet Take 100 mg by mouth every morning.   sucralfate (CARAFATE) 1 g tablet Take 1 tablet (1 g total) by mouth 2 (two) times daily as needed. Do not take within 2 hours of any other medications.   zinc sulfate 220 (50 Zn) MG capsule Take 1 capsule (220 mg total) by mouth daily.   No facility-administered encounter medications on file as of 03/07/2023.    Allergies as of 03/07/2023 - Review Complete 03/07/2023  Allergen Reaction Noted   Adalat [nifedipine] Other (See Comments) 09/17/2013   Hydrocodone Itching and Nausea And Vomiting 09/18/2013   Meperidine hcl Other (See Comments)    Naproxen Nausea And Vomiting     Past Medical History:  Diagnosis Date   Allergy    Anal or rectal pain    sometimes   Anemia    hx of during pregnancy   Anxiety    Arthritis    Asthma    hx of   Bradycardia    "  I KNOW I HAVE BRADYCARDIA ESPECIALLY WHEN I SLEEP"    Cataract 2021   bilateral eyes   Chronic back pain    Degenerative joint disease    osteo   Depression    Diabetes mellitus without complication (HCC)    DM type II   Diverticulosis 2003   Dysrhythmia    hx of  due to eye drop and also low heart rate 40's per pt. Dr. Allyson Sabal follows   Elevated total protein    Esophageal dysmotility    Fibromyalgia    GERD (gastroesophageal reflux disease)    subsequent Nissen Fundoplication   Glaucoma    Hearing loss    Heart murmur    "was told she had a heart murmur"   Hemorrhoids    Hiatal hernia 11/08/2009   Hx of adenomatous colonic polyps 07/02/2002   Hypercalcemia    Hyperlipidemia    Hyperlipidemia    Hypertension    Implantable  loop recorder present 11/28/2021   Nausea    Osteoporosis    PONV (postoperative nausea and vomiting)    Rectal bleeding    from hemorrhoids.     Sleep apnea    DOES USE CPAP    Thrombocytopenia (HCC)    Varicose veins of left lower extremity     Past Surgical History:  Procedure Laterality Date   CARDIAC CATHETERIZATION  03/14/1992   Normal cardiac cath. Normal LV function.   CARDIOVASCULAR STRESS TEST  01/22/2011   No scintigraphic evidence of inducible ischemia.   CAROTID DOPPLER  03/31/2007   Bilateral ICAs - no evidence of significant diameter reduction, dissectin, tortuosity, FMD, or any other vascular abnormality.   CATARACT EXTRACTION, BILATERAL     ESOPHAGEAL MANOMETRY N/A 11/14/2015   Procedure: ESOPHAGEAL MANOMETRY (EM);  Surgeon: Napoleon Form, MD;  Location: [facility];  Service: Endoscopy;  Laterality: N/A;   ESOPHAGEAL MANOMETRY N/A 01/18/2021   Procedure: ESOPHAGEAL MANOMETRY (EM);  Surgeon: Napoleon Form, MD;  Location: [facility];  Service: Endoscopy;  Laterality: N/A;   ESOPHAGOGASTRODUODENOSCOPY N/A 03/08/2021   Procedure: ESOPHAGOGASTRODUODENOSCOPY (EGD);  Surgeon: Corliss Skains, MD;  Location: [facility] OR;  Service: Thoracic;  Laterality: N/A;   EYE SURGERY     bilateral cataracts; bilateral stents   GASTRIC RESECTION  2009   GLAUCOMA SURGERY     JOINT REPLACEMENT     right knee Dr. Lequita Halt 06-23-18   KNEE SURGERY Bilateral    NISSEN FUNDOPLICATION  2000   with subsequent takedown in 2009   TOTAL KNEE ARTHROPLASTY Left 06/10/2017   Procedure: LEFT  TOTAL KNEE ARTHROPLASTY;  Surgeon: Ollen Gross, MD;  Location: [facility];  Service: Orthopedics;  Laterality: Left;  Adductor Block   TOTAL KNEE ARTHROPLASTY Right 06/23/2018   Procedure: RIGHT TOTAL KNEE ARTHROPLASTY;  Surgeon: Ollen Gross, MD;  Location: [facility];  Service: Orthopedics;  Laterality: Right;   TRANSTHORACIC ECHOCARDIOGRAM  12/21/2010   EF 60%, moderate LVH,    TUBAL LIGATION     XI ROBOTIC  ASSISTED HIATAL HERNIA REPAIR N/A 03/08/2021   Procedure: XI ROBOTIC ASSISTED LAPAROSCOPY WITH LYSIS OF ADHESIONS;  Surgeon: Corliss Skains, MD;  Location: [facility] OR;  Service: Thoracic;  Laterality: N/A;    Family History  Problem Relation Age of Onset   Heart disease Mother    Hypertension Mother    Diabetes Mother    Diabetes Father    Kidney disease Father    Stroke Brother    Cancer Maternal Grandmother  Kidney disease Maternal Grandfather    Breast cancer Daughter    Stomach cancer Maternal Aunt    Uterine cancer Maternal Aunt    Leukemia Maternal Uncle    Prostate cancer Maternal Uncle    Colon cancer Neg Hx    Rectal cancer Neg Hx    Esophageal cancer Neg Hx     Social History   Socioeconomic History   Marital status: Married    Spouse name: Not on file   Number of children: 8   Years of education: Not on file   Highest education level: Not on file  Occupational History   Occupation: retired    Comment: retired Clinical biochemist.   Tobacco Use   Smoking status: Never   Smokeless tobacco: Never  Vaping Use   Vaping Use: Never used  Substance and Sexual Activity   Alcohol use: No    Alcohol/week: 0.0 standard drinks of alcohol   Drug use: No   Sexual activity: Yes  Other Topics Concern   Not on file  Social History Narrative   Husband, Zamorah Deno is Next of Kin. Cell # [phone]   Social Determinants of Health   Financial Resource Strain: Not on file  Food Insecurity: Not on file  Transportation Needs: Not on file  Physical Activity: Not on file  Stress: Not on file  Social Connections: Not on file  Intimate Partner Violence: Not on file    Review of Systems  Respiratory:  Positive for apnea. Negative for shortness of breath.   Psychiatric/Behavioral:  Positive for sleep disturbance.     Vitals:   03/07/23 1045  BP: 130/80  Pulse: (!) 58  SpO2: 96%     Physical Exam Constitutional:      Appearance: Normal appearance.  HENT:      Head: Normocephalic.     Mouth/Throat:     Mouth: Mucous membranes are moist.  Cardiovascular:     Heart sounds: No murmur heard.    No friction rub.  Pulmonary:     Effort: No respiratory distress.     Breath sounds: No stridor. No wheezing or rhonchi.  Musculoskeletal:     Cervical back: No rigidity or tenderness.  Neurological:     Mental Status: She is alert.     Data Reviewed: CPAP compliance reviewed showing average use of 8 hours 3 minutes, excellent compliance AutoSet 5-20 95 percentile pressure of 12.3 Residual AHI of 3.4  Recent home sleep study 01/30/2023 shows severe obstructive sleep apnea with AHI of 35.6  Assessment:  Severe obstructive sleep apnea  Compliant with CPAP therapy  Continues to benefit from CPAP -She does have some dryness of the mouth in the mornings -Encouraged to contact DME company to try and walk out to out to adjust CPAP humidification  Plan/Recommendations:  Continue using CPAP nightly  Encouraged to call with any significant concerns  Follow-up in about 6 months     Virl Diamond MD [facility] 03/07/2023, 11:12 AM  CC: [name], MD

## 2023-03-12 DIAGNOSIS — M25811 Other specified joint disorders, right shoulder: Secondary | ICD-10-CM | POA: Diagnosis not present

## 2023-03-12 DIAGNOSIS — G4733 Obstructive sleep apnea (adult) (pediatric): Secondary | ICD-10-CM | POA: Diagnosis not present

## 2023-03-12 DIAGNOSIS — M25812 Other specified joint disorders, left shoulder: Secondary | ICD-10-CM | POA: Diagnosis not present

## 2023-03-12 DIAGNOSIS — I10 Essential (primary) hypertension: Secondary | ICD-10-CM | POA: Diagnosis not present

## 2023-03-14 NOTE — Progress Notes (Signed)
[facility] Summary Report / Loop Recorder 

## 2023-03-18 LAB — CUP PACEART REMOTE DEVICE CHECK
Date Time Interrogation Session: 20240602231035
Implantable Pulse Generator Implant Date: 20240222

## 2023-03-25 ENCOUNTER — Encounter: Payer: Self-pay | Admitting: Cardiology

## 2023-03-27 DIAGNOSIS — M25511 Pain in right shoulder: Secondary | ICD-10-CM | POA: Diagnosis not present

## 2023-03-27 DIAGNOSIS — S46211S Strain of muscle, fascia and tendon of other parts of biceps, right arm, sequela: Secondary | ICD-10-CM | POA: Diagnosis not present

## 2023-04-02 DIAGNOSIS — G8929 Other chronic pain: Secondary | ICD-10-CM | POA: Diagnosis not present

## 2023-04-02 DIAGNOSIS — M25511 Pain in right shoulder: Secondary | ICD-10-CM | POA: Diagnosis not present

## 2023-04-04 ENCOUNTER — Ambulatory Visit
Admission: RE | Admit: 2023-04-04 | Discharge: 2023-04-04 | Disposition: A | Discharge status: Home / Self Care | Payer: Medicare Other | Source: Ambulatory Visit | Attending: Nurse Practitioner | Admitting: Nurse Practitioner

## 2023-04-04 ENCOUNTER — Other Ambulatory Visit: Payer: Self-pay | Admitting: Nurse Practitioner

## 2023-04-04 DIAGNOSIS — M25511 Pain in right shoulder: Secondary | ICD-10-CM | POA: Diagnosis not present

## 2023-04-04 DIAGNOSIS — G8929 Other chronic pain: Secondary | ICD-10-CM

## 2023-04-04 DIAGNOSIS — M19011 Primary osteoarthritis, right shoulder: Secondary | ICD-10-CM | POA: Diagnosis not present

## 2023-04-08 DIAGNOSIS — S46211S Strain of muscle, fascia and tendon of other parts of biceps, right arm, sequela: Secondary | ICD-10-CM | POA: Diagnosis not present

## 2023-04-08 DIAGNOSIS — M25511 Pain in right shoulder: Secondary | ICD-10-CM | POA: Diagnosis not present

## 2023-04-11 DIAGNOSIS — M25511 Pain in right shoulder: Secondary | ICD-10-CM | POA: Diagnosis not present

## 2023-04-11 DIAGNOSIS — S46211S Strain of muscle, fascia and tendon of other parts of biceps, right arm, sequela: Secondary | ICD-10-CM | POA: Diagnosis not present

## 2023-04-15 DIAGNOSIS — S46211S Strain of muscle, fascia and tendon of other parts of biceps, right arm, sequela: Secondary | ICD-10-CM | POA: Diagnosis not present

## 2023-04-15 DIAGNOSIS — M25511 Pain in right shoulder: Secondary | ICD-10-CM | POA: Diagnosis not present

## 2023-04-17 DIAGNOSIS — M25511 Pain in right shoulder: Secondary | ICD-10-CM | POA: Diagnosis not present

## 2023-04-17 DIAGNOSIS — S46211S Strain of muscle, fascia and tendon of other parts of biceps, right arm, sequela: Secondary | ICD-10-CM | POA: Diagnosis not present

## 2023-04-19 ENCOUNTER — Ambulatory Visit: Payer: Medicare Other | Admitting: Pulmonary Disease

## 2023-04-22 ENCOUNTER — Ambulatory Visit (INDEPENDENT_AMBULATORY_CARE_PROVIDER_SITE_OTHER): Payer: Medicare Other

## 2023-04-22 DIAGNOSIS — M25511 Pain in right shoulder: Secondary | ICD-10-CM | POA: Diagnosis not present

## 2023-04-22 DIAGNOSIS — R55 Syncope and collapse: Secondary | ICD-10-CM

## 2023-04-22 DIAGNOSIS — S46211S Strain of muscle, fascia and tendon of other parts of biceps, right arm, sequela: Secondary | ICD-10-CM | POA: Diagnosis not present

## 2023-04-22 LAB — CUP PACEART REMOTE DEVICE CHECK
Date Time Interrogation Session: 20240705230825
Implantable Pulse Generator Implant Date: 20240222

## 2023-04-23 ENCOUNTER — Ambulatory Visit: Payer: Medicare Other | Admitting: Cardiology

## 2023-04-23 ENCOUNTER — Encounter: Payer: Self-pay | Admitting: Cardiology

## 2023-04-23 VITALS — BP 132/78 | HR 64 | Ht 70.0 in | Wt 221.4 lb

## 2023-04-23 DIAGNOSIS — E119 Type 2 diabetes mellitus without complications: Secondary | ICD-10-CM | POA: Diagnosis not present

## 2023-04-23 DIAGNOSIS — Z794 Long term (current) use of insulin: Secondary | ICD-10-CM | POA: Insufficient documentation

## 2023-04-23 DIAGNOSIS — I10 Essential (primary) hypertension: Secondary | ICD-10-CM | POA: Insufficient documentation

## 2023-04-23 DIAGNOSIS — E782 Mixed hyperlipidemia: Secondary | ICD-10-CM | POA: Diagnosis not present

## 2023-04-23 DIAGNOSIS — I493 Ventricular premature depolarization: Secondary | ICD-10-CM | POA: Insufficient documentation

## 2023-04-23 MED ORDER — LOSARTAN POTASSIUM 25 MG PO TABS: 12.5000 mg | ORAL_TABLET | Freq: Every day | ORAL | 3 refills | Status: DC

## 2023-04-23 NOTE — Patient Instructions (Addendum)
Medication Instructions:  Your physician has recommended you make the following change in your medication:  DECREASE: Losartan 12.5 mg (half tablet) depending on your blood pressure.  Take your blood pressure daily, if systolic blood pressure (top number) is higher than 150 mmHg then take your dose (half pill).  *If you need a refill on your cardiac medications before your next appointment, please call your pharmacy*   Lab Work: None   Testing/Procedures: None   Follow-Up: At [facility], you and your health needs are our priority.  As part of our continuing mission to provide you with exceptional heart care, we have created designated Provider Care Teams.  These Care Teams include your primary Cardiologist (physician) and Advanced Practice Providers (APPs -  Physician Assistants and Nurse Practitioners) who all work together to provide you with the care you need, when you need it.    Your next appointment:   4 month(s)  Provider:   Thomasene Ripple, DO

## 2023-04-23 NOTE — Progress Notes (Signed)
Cardiology Office Note:    Date:  04/23/2023   ID:  [name], DOB 02-21-1941, MRN [id number]  PCP:  [name], MD  Cardiologist:  Thomasene Ripple, DO  Electrophysiologist:  None   Referring MD: [name], MD   " I am having shortness of breath"   History of Present Illness:    [name] is a 82 y.o. female with a hx of hypertension, occasional PVC , asthma, anxiety, GERD, diabetes mellitus, fibromyalgia here today for follow-up visit.  At her last visit she was doing well from a cardiovascular standpoint.  Since I saw the patient she has not had any hospitalizations.  She has not had any syncope episodes either.  She is here in office with her daughter and her husband.  Past Medical History:  Diagnosis Date   Allergy    Anal or rectal pain    sometimes   Anemia    hx of during pregnancy   Anxiety    Arthritis    Asthma    hx of   Bradycardia    " I KNOW I HAVE BRADYCARDIA ESPECIALLY WHEN I SLEEP"    Cataract 2021   bilateral eyes   Chronic back pain    Degenerative joint disease    osteo   Depression    Diabetes mellitus without complication (HCC)    DM type II   Diverticulosis 2003   Dysrhythmia    hx of  due to eye drop and also low heart rate 40's per pt. Dr. Allyson Sabal follows   Elevated total protein    Esophageal dysmotility    Fibromyalgia    GERD (gastroesophageal reflux disease)    subsequent Nissen Fundoplication   Glaucoma    Hearing loss    Heart murmur    "was told she had a heart murmur"   Hemorrhoids    Hiatal hernia 11/08/2009   Hx of adenomatous colonic polyps 07/02/2002   Hypercalcemia    Hyperlipidemia    Hyperlipidemia    Hypertension    Implantable loop recorder present 11/28/2021   Nausea    Osteoporosis    PONV (postoperative nausea and vomiting)    Rectal bleeding    from hemorrhoids.     Sleep apnea    DOES USE CPAP    Thrombocytopenia (HCC)    Varicose veins of left lower extremity     Past Surgical  History:  Procedure Laterality Date   CARDIAC CATHETERIZATION  03/14/1992   Normal cardiac cath. Normal LV function.   CARDIOVASCULAR STRESS TEST  01/22/2011   No scintigraphic evidence of inducible ischemia.   CAROTID DOPPLER  03/31/2007   Bilateral ICAs - no evidence of significant diameter reduction, dissectin, tortuosity, FMD, or any other vascular abnormality.   CATARACT EXTRACTION, BILATERAL     ESOPHAGEAL MANOMETRY N/A 11/14/2015   Procedure: ESOPHAGEAL MANOMETRY (EM);  Surgeon: Napoleon Form, MD;  Location: [facility];  Service: Endoscopy;  Laterality: N/A;   ESOPHAGEAL MANOMETRY N/A 01/18/2021   Procedure: ESOPHAGEAL MANOMETRY (EM);  Surgeon: Napoleon Form, MD;  Location: [facility];  Service: Endoscopy;  Laterality: N/A;   ESOPHAGOGASTRODUODENOSCOPY N/A 03/08/2021   Procedure: ESOPHAGOGASTRODUODENOSCOPY (EGD);  Surgeon: Corliss Skains, MD;  Location: [facility] OR;  Service: Thoracic;  Laterality: N/A;   EYE SURGERY     bilateral cataracts; bilateral stents   GASTRIC RESECTION  2009   GLAUCOMA SURGERY     JOINT REPLACEMENT     right knee Dr. Lequita Halt  06-23-18   KNEE SURGERY Bilateral    NISSEN FUNDOPLICATION  2000   with subsequent takedown in 2009   TOTAL KNEE ARTHROPLASTY Left 06/10/2017   Procedure: LEFT  TOTAL KNEE ARTHROPLASTY;  Surgeon: Ollen Gross, MD;  Location: [facility];  Service: Orthopedics;  Laterality: Left;  Adductor Block   TOTAL KNEE ARTHROPLASTY Right 06/23/2018   Procedure: RIGHT TOTAL KNEE ARTHROPLASTY;  Surgeon: Ollen Gross, MD;  Location: [facility];  Service: Orthopedics;  Laterality: Right;   TRANSTHORACIC ECHOCARDIOGRAM  12/21/2010   EF 60%, moderate LVH,    TUBAL LIGATION     XI ROBOTIC ASSISTED HIATAL HERNIA REPAIR N/A 03/08/2021   Procedure: XI ROBOTIC ASSISTED LAPAROSCOPY WITH LYSIS OF ADHESIONS;  Surgeon: Corliss Skains, MD;  Location: [facility] OR;  Service: Thoracic;  Laterality: N/A;    Current Medications: Current Meds  Medication Sig    acetaminophen (TYLENOL) 500 MG tablet Take 1,000 mg by mouth 2 (two) times daily as needed for headache (back and knee pain).   albuterol (VENTOLIN HFA) 108 (90 Base) MCG/ACT inhaler Inhale 1 puff into the lungs as needed for wheezing or shortness of breath.   ascorbic acid (VITAMIN C) 500 MG tablet Take 1 tablet (500 mg total) by mouth daily.   brimonidine (ALPHAGAN) 0.2 % ophthalmic solution 1 drop 2 (two) times daily.   cetirizine (ZYRTEC) 10 MG tablet Take 10 mg by mouth at bedtime.   Cholecalciferol (VITAMIN D3) 250 MCG (10000 UT) capsule Take 10,000 Units by mouth daily.   dexlansoprazole (DEXILANT) 60 MG capsule Take 1 capsule (60 mg total) by mouth daily.   hydrocortisone (ANUSOL-HC) 25 MG suppository Place 1 suppository (25 mg total) rectally at bedtime.   losartan (COZAAR) 25 MG tablet Take 0.5 tablets (12.5 mg total) by mouth daily.   montelukast (SINGULAIR) 10 MG tablet Take 10 mg by mouth at bedtime.   nystatin ointment (MYCOSTATIN) Apply 1 application  topically See admin instructions. 1 application 1-2 times a day as needed for rash   potassium chloride (KLOR-CON) 10 MEQ tablet Take 1 tablet (10 mEq total) by mouth once a week.   rosuvastatin (CRESTOR) 40 MG tablet Take 40 mg by mouth at bedtime.   sertraline (ZOLOFT) 100 MG tablet Take 100 mg by mouth every morning.   sucralfate (CARAFATE) 1 g tablet Take 1 tablet (1 g total) by mouth 2 (two) times daily as needed. Do not take within 2 hours of any other medications.   zinc sulfate 220 (50 Zn) MG capsule Take 1 capsule (220 mg total) by mouth daily.   [DISCONTINUED] losartan (COZAAR) 25 MG tablet Take 25 mg by mouth as needed.     Allergies:   Adalat [nifedipine], Hydrocodone, Meperidine hcl, and Naproxen   Social History   Socioeconomic History   Marital status: Married    Spouse name: Not on file   Number of children: 8   Years of education: Not on file   Highest education level: Not on file  Occupational History    Occupation: retired    Comment: retired Clinical biochemist.   Tobacco Use   Smoking status: Never   Smokeless tobacco: Never  Vaping Use   Vaping Use: Never used  Substance and Sexual Activity   Alcohol use: No    Alcohol/week: 0.0 standard drinks of alcohol   Drug use: No   Sexual activity: Yes  Other Topics Concern   Not on file  Social History Narrative   Husband, Adesire Sooter is Next of  Kin. Cell # [phone]   Social Determinants of Health   Financial Resource Strain: Not on file  Food Insecurity: Not on file  Transportation Needs: Not on file  Physical Activity: Not on file  Stress: Not on file  Social Connections: Not on file     Family History: The patient's family history includes Breast cancer in her daughter; Cancer in her maternal grandmother; Diabetes in her father and mother; Heart disease in her mother; Hypertension in her mother; Kidney disease in her father and maternal grandfather; Leukemia in her maternal uncle; Prostate cancer in her maternal uncle; Stomach cancer in her maternal aunt; Stroke in her brother; Uterine cancer in her maternal aunt. There is no history of Colon cancer, Rectal cancer, or Esophageal cancer.  ROS:   Review of Systems  Constitution: Negative for decreased appetite, fever and weight gain.  HENT: Negative for congestion, ear discharge, hoarse voice and sore throat.   Eyes: Negative for discharge, redness, vision loss in right eye and visual halos.  Cardiovascular: Reports dyspnea exertion.  Negative for chest pain, leg swelling, orthopnea and palpitations.  Respiratory: Negative for cough, hemoptysis, shortness of breath and snoring.   Endocrine: Negative for heat intolerance and polyphagia.  Hematologic/Lymphatic: Negative for bleeding problem. Does not bruise/bleed easily.  Skin: Negative for flushing, nail changes, rash and suspicious lesions.  Musculoskeletal: Negative for arthritis, joint pain, muscle cramps, myalgias, neck pain and  stiffness.  Gastrointestinal: Negative for abdominal pain, bowel incontinence, diarrhea and excessive appetite.  Genitourinary: Negative for decreased libido, genital sores and incomplete emptying.  Neurological: Negative for brief paralysis, focal weakness, headaches and loss of balance.  Psychiatric/Behavioral: Negative for altered mental status, depression and suicidal ideas.  Allergic/Immunologic: Negative for HIV exposure and persistent infections.    EKGs/Labs/Other Studies Reviewed:    The following studies were reviewed today:   EKG:  The ekg ordered today demonstrates sinus rhythm, heart rate 60 bpm  CTA of the chest December 2023  IMPRESSION: 1. No evidence of pulmonary embolism. 2. Suspect mild pulmonary interstitial edema. 3. Borderline enlarged main pulmonary artery suggesting pulmonary hypertension. Additionally, there is cardiomegaly with reflux of contrast into the IVC/hepatic veins suggesting right heart dysfunction. 4. Unchanged hiatal hernia.     Electronically Signed   By: Lesia Hausen M.D.   On: 10/04/2022 14:03    Coronary CTA February 2023 FINDINGS: Non-cardiac: See separate report from [facility]. No significant findings on limited lung and soft tissue windows.   Calcium Score: No calcium noted in coronary arteries   Coronary Arteries: Right dominant with no anomalies   LM: Normal   LAD: Normal   D1: Normal   D2: Normal   Circumflex: Normal   OM1: Normal   OM2: Normal   RCA: Normal   PDA: Normal   PLA: Normal   IMPRESSION: 1. Calcium score 0   2.  Normal right dominant coronary arteries   3.  Normal ascending thoracic aorta 3.6 cm   Charlton Haws   Electronically Signed: By: Charlton Haws M.D. On: 12/04/2021 12:58    Echocardiogram February 2023 IMPRESSIONS   1. Left ventricular ejection fraction, by estimation, is 60 to 65%. The  left ventricle has normal function. The left ventricle has no regional  wall  motion abnormalities. There is mild concentric left ventricular  hypertrophy. Left ventricular diastolic  parameters are consistent with Grade I diastolic dysfunction (impaired  relaxation).   2. Right ventricular systolic function is normal. The right ventricular  size  is normal.   3. Left atrial size was mild to moderately dilated.   4. The mitral valve is normal in structure. Trivial mitral valve  regurgitation. No evidence of mitral stenosis.   5. The aortic valve is tricuspid. Aortic valve regurgitation is not  visualized. No aortic stenosis is present.   6. Aortic dilatation noted. There is borderline dilatation of the  ascending aorta, measuring 36 mm.   7. The inferior vena cava is normal in size with greater than 50%  respiratory variability, suggesting right atrial pressure of 3 mmHg.   Recent Labs: 10/11/2022: NT-Pro BNP 181 10/21/2022: B Natriuretic Peptide 74.6 10/22/2022: ALT 10; Hemoglobin 12.1; Platelets 143; TSH 0.303 01/24/2023: BUN 21; Creatinine, Ser 1.06; Magnesium 2.2; Potassium 4.8; Sodium 141  Recent Lipid Panel    Component Value Date/Time   CHOL 200 (H) 10/26/2020 0942   TRIG 46 10/26/2020 0942   HDL 76 10/26/2020 0942   CHOLHDL 2.6 10/26/2020 0942   CHOLHDL 3.2 07/24/2020 2044   VLDL 13 07/24/2020 2044   LDLCALC 115 (H) 10/26/2020 0942    Physical Exam:    VS:  BP 132/78 (BP Location: Right Arm, Patient Position: Sitting, Cuff Size: Normal)   Pulse 64   Ht 5\' 10"  (1.778 m)   Wt 221 lb 6.4 oz (100.4 kg)   SpO2 96%   BMI 31.77 kg/m     Wt Readings from Last 3 Encounters:  04/23/23 221 lb 6.4 oz (100.4 kg)  03/07/23 216 lb 12.8 oz (98.3 kg)  01/24/23 208 lb (94.3 kg)     GEN: Well nourished, well developed in no acute distress HEENT: Normal NECK: Noted JVD; No carotid bruits, there is also suspicion of a small likely fat pad/cyst on palpation of the right neck LYMPHATICS: No lymphadenopathy CARDIAC: S1S2 noted,RRR, no murmurs, rubs,  gallops RESPIRATORY:  Clear to auscultation without rales, wheezing or rhonchi  ABDOMEN: Soft, non-tender, non-distended, +bowel sounds, no guarding. EXTREMITIES: +2 bilateral lower extremity edema edema, No cyanosis, no clubbing MUSCULOSKELETAL:  No deformity  SKIN: Warm and dry NEUROLOGIC:  Alert and oriented x 3, non-focal PSYCHIATRIC:  Normal affect, good insight  ASSESSMENT:    1. Essential hypertension   2. Mixed hyperlipidemia   3. PVC (premature ventricular contraction)   4. Insulin-requiring or dependent type II diabetes mellitus (HCC)      PLAN:    She is experiencing some lightheadedness with intermittent hypotension.  With this elderly patient will proceed with a lenient blood pressure control for less than 150/90 mmHg.  I have asked this patient to take her blood pressure on a daily basis.  Should her blood pressure be greater than 150/90 mmHg she will take half of her losartan.  If it is less we will keep off the medication for that day.  I will forward my recommendations as well to her PCP.  Heart recent device implantable loop recorder interrogation did not show any evidence of any arrhythmia.  I shared this information with the patient as well  The patient understands the need to lose weight with diet and exercise. We have discussed specific strategies for this.   The patient is in agreement with the above plan. The patient left the office in stable condition.  The patient will follow up in 4 months or sooner if needed.   Medication Adjustments/Labs and Tests Ordered: Current medicines are reviewed at length with the patient today.  Concerns regarding medicines are outlined above.  No orders of the defined types  were placed in this encounter.  Meds ordered this encounter  Medications   losartan (COZAAR) 25 MG tablet    Sig: Take 0.5 tablets (12.5 mg total) by mouth daily.    Dispense:  45 tablet    Refill:  3    Patient Instructions  Medication  Instructions:  Your physician has recommended you make the following change in your medication:  DECREASE: Losartan 12.5 mg (half tablet) depending on your blood pressure.  Take your blood pressure daily, if systolic blood pressure (top number) is higher than 150 mmHg then take your dose (half pill).  *If you need a refill on your cardiac medications before your next appointment, please call your pharmacy*   Lab Work: None   Testing/Procedures: None   Follow-Up: At [facility], you and your health needs are our priority.  As part of our continuing mission to provide you with exceptional heart care, we have created designated Provider Care Teams.  These Care Teams include your primary Cardiologist (physician) and Advanced Practice Providers (APPs -  Physician Assistants and Nurse Practitioners) who all work together to provide you with the care you need, when you need it.    Your next appointment:   4 month(s)  Provider:   Thomasene Ripple, DO       Adopting a Healthy Lifestyle.  Know what a healthy weight is for you (roughly BMI <25) and aim to maintain this   Aim for 7+ servings of fruits and vegetables daily   65-80+ fluid ounces of water or unsweet tea for healthy kidneys   Limit to max 1 drink of alcohol per day; avoid smoking/tobacco   Limit animal fats in diet for cholesterol and heart health - choose grass fed whenever available   Avoid highly processed foods, and foods high in saturated/trans fats   Aim for low stress - take time to unwind and care for your mental health   Aim for 150 min of moderate intensity exercise weekly for heart health, and weights twice weekly for bone health   Aim for 7-9 hours of sleep daily   When it comes to diets, agreement about the perfect plan isnt easy to find, even among the experts. Experts at the [employer] [facility] [employer] developed an idea known as the Healthy Eating Plate. Just imagine a plate divided into  logical, healthy portions.   The emphasis is on diet quality:   Load up on vegetables and fruits - one-half of your plate: Aim for color and variety, and remember that potatoes dont count.   Go for whole grains - one-quarter of your plate: Whole wheat, barley, wheat berries, quinoa, oats, brown rice, and foods made with them. If you want pasta, go with whole wheat pasta.   Protein power - one-quarter of your plate: Fish, chicken, beans, and nuts are all healthy, versatile protein sources. Limit red meat.   The diet, however, does go beyond the plate, offering a few other suggestions.   Use healthy plant oils, such as olive, canola, soy, corn, sunflower and peanut. Check the labels, and avoid partially hydrogenated oil, which have unhealthy trans fats.   If youre thirsty, drink water. Coffee and tea are good in moderation, but skip sugary drinks and limit milk and dairy products to one or two daily servings.   The type of carbohydrate in the diet is more important than the amount. Some sources of carbohydrates, such as vegetables, fruits, whole grains, and beans-are healthier than others.  Finally, stay active  Signed, Thomasene Ripple, DO  04/23/2023 11:42 AM    [facility]

## 2023-04-24 DIAGNOSIS — S46211S Strain of muscle, fascia and tendon of other parts of biceps, right arm, sequela: Secondary | ICD-10-CM | POA: Diagnosis not present

## 2023-04-24 DIAGNOSIS — M25511 Pain in right shoulder: Secondary | ICD-10-CM | POA: Diagnosis not present

## 2023-04-29 ENCOUNTER — Encounter: Payer: Self-pay | Admitting: Podiatry

## 2023-04-29 ENCOUNTER — Ambulatory Visit (INDEPENDENT_AMBULATORY_CARE_PROVIDER_SITE_OTHER): Payer: Medicare Other | Admitting: Podiatry

## 2023-04-29 DIAGNOSIS — G8929 Other chronic pain: Secondary | ICD-10-CM | POA: Diagnosis not present

## 2023-04-29 DIAGNOSIS — B351 Tinea unguium: Secondary | ICD-10-CM | POA: Diagnosis not present

## 2023-04-29 DIAGNOSIS — M79674 Pain in right toe(s): Secondary | ICD-10-CM

## 2023-04-29 DIAGNOSIS — M25811 Other specified joint disorders, right shoulder: Secondary | ICD-10-CM | POA: Diagnosis not present

## 2023-04-29 DIAGNOSIS — I10 Essential (primary) hypertension: Secondary | ICD-10-CM | POA: Diagnosis not present

## 2023-04-29 DIAGNOSIS — M25812 Other specified joint disorders, left shoulder: Secondary | ICD-10-CM | POA: Diagnosis not present

## 2023-04-29 DIAGNOSIS — M79675 Pain in left toe(s): Secondary | ICD-10-CM

## 2023-04-29 DIAGNOSIS — M25511 Pain in right shoulder: Secondary | ICD-10-CM | POA: Diagnosis not present

## 2023-04-29 DIAGNOSIS — I739 Peripheral vascular disease, unspecified: Secondary | ICD-10-CM

## 2023-04-29 DIAGNOSIS — J309 Allergic rhinitis, unspecified: Secondary | ICD-10-CM | POA: Diagnosis not present

## 2023-04-29 DIAGNOSIS — B353 Tinea pedis: Secondary | ICD-10-CM

## 2023-04-29 MED ORDER — KETOCONAZOLE 2 % EX CREA: 1.0000 | TOPICAL_CREAM | Freq: Every day | CUTANEOUS | 2 refills | Status: DC

## 2023-04-29 NOTE — Progress Notes (Signed)
  Subjective:  Patient ID: [name], female    DOB: October 28, 1940,   MRN: [id number]  Chief Complaint  Patient presents with   Nail Problem    DFC-left foot is peeling,sore and redness on the bottom since 1 month    82 y.o. female presents for concern of thickened elongated and painful nails that are difficult to trim. Requesting to have them trimmed today. History of PAD and at risk for foot care. Relates some peeling and redness on the bottom of her feet for one month.   PCP:  [name], MD    . Denies any other pedal complaints. Denies n/v/f/c.   Past Medical History:  Diagnosis Date   Allergy    Anal or rectal pain    sometimes   Anemia    hx of during pregnancy   Anxiety    Arthritis    Asthma    hx of   Bradycardia    " I KNOW I HAVE BRADYCARDIA ESPECIALLY WHEN I SLEEP"    Cataract 2021   bilateral eyes   Chronic back pain    Degenerative joint disease    osteo   Depression    Diabetes mellitus without complication (HCC)    DM type II   Diverticulosis 2003   Dysrhythmia    hx of  due to eye drop and also low heart rate 40's per pt. Dr. Allyson Sabal follows   Elevated total protein    Esophageal dysmotility    Fibromyalgia    GERD (gastroesophageal reflux disease)    subsequent Nissen Fundoplication   Glaucoma    Hearing loss    Heart murmur    "was told she had a heart murmur"   Hemorrhoids    Hiatal hernia 11/08/2009   Hx of adenomatous colonic polyps 07/02/2002   Hypercalcemia    Hyperlipidemia    Hyperlipidemia    Hypertension    Implantable loop recorder present 11/28/2021   Nausea    Osteoporosis    PONV (postoperative nausea and vomiting)    Rectal bleeding    from hemorrhoids.     Sleep apnea    DOES USE CPAP    Thrombocytopenia (HCC)    Varicose veins of left lower extremity     Objective:  Physical Exam: Vascular: DP/PT pulses 1/4 bilateral. CFT <3 seconds. Normal hair growth on digits. No edema.  Skin. No lacerations or abrasions  bilateral feet. Nails 1-5 bilateral are thickened elongated and with subungual debris. Scaling and erythema noted to mostly left plantar feet.  Musculoskeletal: MMT 5/5 bilateral lower extremities in DF, PF, Inversion and Eversion. Deceased ROM in DF of ankle joint. Collapse of medial arch bilateral with eversion of calcaneus. Too many toes sign and unable to perform single limb heel rise.  Neurological: Sensation intact to light touch.   Assessment:   1. Pain due to onychomycosis of toenails of both feet   2. PVD (peripheral vascular disease) (HCC)   3. Tinea pedis of both feet      Plan:  Patient was evaluated and treated and all questions answered. -Mechanically debrided all nails 1-5 bilateral using sterile nail nipper and filed with dremel without incident  -Answered all patient questions -Awaiting orthotics.  -Patient to return  in 3 months for at risk foot care -Patient advised to call the office if any problems or questions arise in the meantime.   Louann Sjogren, DPM

## 2023-05-01 DIAGNOSIS — M25511 Pain in right shoulder: Secondary | ICD-10-CM | POA: Diagnosis not present

## 2023-05-01 DIAGNOSIS — S46211S Strain of muscle, fascia and tendon of other parts of biceps, right arm, sequela: Secondary | ICD-10-CM | POA: Diagnosis not present

## 2023-05-03 DIAGNOSIS — M25511 Pain in right shoulder: Secondary | ICD-10-CM | POA: Diagnosis not present

## 2023-05-03 DIAGNOSIS — S46211S Strain of muscle, fascia and tendon of other parts of biceps, right arm, sequela: Secondary | ICD-10-CM | POA: Diagnosis not present

## 2023-05-07 DIAGNOSIS — M25511 Pain in right shoulder: Secondary | ICD-10-CM | POA: Diagnosis not present

## 2023-05-07 DIAGNOSIS — S46211S Strain of muscle, fascia and tendon of other parts of biceps, right arm, sequela: Secondary | ICD-10-CM | POA: Diagnosis not present

## 2023-05-08 NOTE — Progress Notes (Signed)
[facility] Summary Report / Loop Recorder 

## 2023-05-09 DIAGNOSIS — M25511 Pain in right shoulder: Secondary | ICD-10-CM | POA: Diagnosis not present

## 2023-05-09 DIAGNOSIS — S46211S Strain of muscle, fascia and tendon of other parts of biceps, right arm, sequela: Secondary | ICD-10-CM | POA: Diagnosis not present

## 2023-05-13 DIAGNOSIS — M25811 Other specified joint disorders, right shoulder: Secondary | ICD-10-CM | POA: Diagnosis not present

## 2023-05-13 DIAGNOSIS — I10 Essential (primary) hypertension: Secondary | ICD-10-CM | POA: Diagnosis not present

## 2023-05-13 DIAGNOSIS — R635 Abnormal weight gain: Secondary | ICD-10-CM | POA: Diagnosis not present

## 2023-05-13 DIAGNOSIS — R5383 Other fatigue: Secondary | ICD-10-CM | POA: Diagnosis not present

## 2023-05-13 DIAGNOSIS — E559 Vitamin D deficiency, unspecified: Secondary | ICD-10-CM | POA: Diagnosis not present

## 2023-05-13 DIAGNOSIS — M25812 Other specified joint disorders, left shoulder: Secondary | ICD-10-CM | POA: Diagnosis not present

## 2023-05-14 ENCOUNTER — Other Ambulatory Visit: Payer: Self-pay | Admitting: Family Medicine

## 2023-05-14 ENCOUNTER — Telehealth: Payer: Self-pay | Admitting: Podiatry

## 2023-05-14 DIAGNOSIS — Z0000 Encounter for general adult medical examination without abnormal findings: Secondary | ICD-10-CM | POA: Diagnosis not present

## 2023-05-14 DIAGNOSIS — M25811 Other specified joint disorders, right shoulder: Secondary | ICD-10-CM

## 2023-05-14 DIAGNOSIS — Z1339 Encounter for screening examination for other mental health and behavioral disorders: Secondary | ICD-10-CM | POA: Diagnosis not present

## 2023-05-14 DIAGNOSIS — Z1331 Encounter for screening for depression: Secondary | ICD-10-CM | POA: Diagnosis not present

## 2023-05-14 NOTE — Telephone Encounter (Signed)
Pt called checking on status of orthotics that she was cast for in April 2024 at her appt.   Talked with the new pedorthist and she said that [facility] did call her end of last week and told her the impression they received was not good and she needed to be rescanned.  I called pt and she is scheduled to see [place] on 8.1 to be rescanned. I did apologize and she said thank you for getting to the bottom of the issue.

## 2023-05-15 DIAGNOSIS — S46211S Strain of muscle, fascia and tendon of other parts of biceps, right arm, sequela: Secondary | ICD-10-CM | POA: Diagnosis not present

## 2023-05-15 DIAGNOSIS — M25511 Pain in right shoulder: Secondary | ICD-10-CM | POA: Diagnosis not present

## 2023-05-16 ENCOUNTER — Other Ambulatory Visit: Payer: Medicare Other

## 2023-05-16 DIAGNOSIS — M2141 Flat foot [pes planus] (acquired), right foot: Secondary | ICD-10-CM

## 2023-05-16 DIAGNOSIS — M722 Plantar fascial fibromatosis: Secondary | ICD-10-CM

## 2023-05-16 DIAGNOSIS — M76821 Posterior tibial tendinitis, right leg: Secondary | ICD-10-CM

## 2023-05-16 NOTE — Progress Notes (Signed)
Patient was present for re-scan for orthotics as [facility] could not read last scans  Orthotics ordered today   [name] Cped, CFo, CFm

## 2023-05-17 ENCOUNTER — Telehealth: Payer: Self-pay | Admitting: Podiatry

## 2023-05-17 ENCOUNTER — Other Ambulatory Visit: Payer: Self-pay | Admitting: Podiatry

## 2023-05-17 DIAGNOSIS — B353 Tinea pedis: Secondary | ICD-10-CM

## 2023-05-17 MED ORDER — KETOCONAZOLE 2 % EX CREA: 1.0000 | TOPICAL_CREAM | Freq: Every day | CUTANEOUS | 2 refills | Status: DC

## 2023-05-17 NOTE — Telephone Encounter (Signed)
Pt asking for a Rx to help with her pain of burning & itching of her feet.   Please Advise

## 2023-05-17 NOTE — Telephone Encounter (Signed)
I can sent in a topical for her to see if this helps for now. If this does not help in the next couple weeks will need to come in to be seen.

## 2023-05-20 DIAGNOSIS — M25511 Pain in right shoulder: Secondary | ICD-10-CM | POA: Diagnosis not present

## 2023-05-20 DIAGNOSIS — S46211S Strain of muscle, fascia and tendon of other parts of biceps, right arm, sequela: Secondary | ICD-10-CM | POA: Diagnosis not present

## 2023-05-21 DIAGNOSIS — H40051 Ocular hypertension, right eye: Secondary | ICD-10-CM | POA: Diagnosis not present

## 2023-05-21 DIAGNOSIS — H5340 Unspecified visual field defects: Secondary | ICD-10-CM | POA: Diagnosis not present

## 2023-05-21 DIAGNOSIS — H401132 Primary open-angle glaucoma, bilateral, moderate stage: Secondary | ICD-10-CM | POA: Diagnosis not present

## 2023-05-21 DIAGNOSIS — E113212 Type 2 diabetes mellitus with mild nonproliferative diabetic retinopathy with macular edema, left eye: Secondary | ICD-10-CM | POA: Diagnosis not present

## 2023-05-24 NOTE — Telephone Encounter (Signed)
 Lmom for pt to call back to schedule picking up orthotics    Balance is 490.00

## 2023-05-27 ENCOUNTER — Ambulatory Visit: Payer: Medicare Other

## 2023-05-27 DIAGNOSIS — R55 Syncope and collapse: Secondary | ICD-10-CM | POA: Diagnosis not present

## 2023-05-27 DIAGNOSIS — M25511 Pain in right shoulder: Secondary | ICD-10-CM | POA: Diagnosis not present

## 2023-05-27 DIAGNOSIS — S46211S Strain of muscle, fascia and tendon of other parts of biceps, right arm, sequela: Secondary | ICD-10-CM | POA: Diagnosis not present

## 2023-05-29 DIAGNOSIS — E782 Mixed hyperlipidemia: Secondary | ICD-10-CM | POA: Diagnosis not present

## 2023-05-29 DIAGNOSIS — E876 Hypokalemia: Secondary | ICD-10-CM | POA: Diagnosis not present

## 2023-05-29 DIAGNOSIS — I10 Essential (primary) hypertension: Secondary | ICD-10-CM | POA: Diagnosis not present

## 2023-05-31 ENCOUNTER — Ambulatory Visit: Payer: Medicare Other

## 2023-05-31 NOTE — Progress Notes (Signed)
Patient presents today to pick up custom molded foot orthotics, diagnosed with plantar fasciitis and PTTD by Dr. Ralene Cork.   Orthotics were dispensed and fit was satisfactory. Reviewed instructions for break-in and wear. Written instructions given to patient.  Patient will follow up as needed.   [name] Cped, CFo, CFm

## 2023-06-03 DIAGNOSIS — M25512 Pain in left shoulder: Secondary | ICD-10-CM | POA: Diagnosis not present

## 2023-06-03 DIAGNOSIS — M25511 Pain in right shoulder: Secondary | ICD-10-CM | POA: Diagnosis not present

## 2023-06-04 ENCOUNTER — Ambulatory Visit
Admission: RE | Admit: 2023-06-04 | Discharge: 2023-06-04 | Disposition: A | Discharge status: Home / Self Care | Payer: Medicare Other | Source: Ambulatory Visit | Attending: Family Medicine | Admitting: Family Medicine

## 2023-06-04 DIAGNOSIS — S46011A Strain of muscle(s) and tendon(s) of the rotator cuff of right shoulder, initial encounter: Secondary | ICD-10-CM | POA: Diagnosis not present

## 2023-06-04 DIAGNOSIS — M19011 Primary osteoarthritis, right shoulder: Secondary | ICD-10-CM | POA: Diagnosis not present

## 2023-06-04 DIAGNOSIS — M25811 Other specified joint disorders, right shoulder: Secondary | ICD-10-CM

## 2023-06-04 DIAGNOSIS — M25511 Pain in right shoulder: Secondary | ICD-10-CM | POA: Diagnosis not present

## 2023-06-05 DIAGNOSIS — I10 Essential (primary) hypertension: Secondary | ICD-10-CM | POA: Diagnosis not present

## 2023-06-05 DIAGNOSIS — M25812 Other specified joint disorders, left shoulder: Secondary | ICD-10-CM | POA: Diagnosis not present

## 2023-06-05 DIAGNOSIS — M25811 Other specified joint disorders, right shoulder: Secondary | ICD-10-CM | POA: Diagnosis not present

## 2023-06-05 DIAGNOSIS — Z789 Other specified health status: Secondary | ICD-10-CM | POA: Diagnosis not present

## 2023-06-05 DIAGNOSIS — E782 Mixed hyperlipidemia: Secondary | ICD-10-CM | POA: Diagnosis not present

## 2023-06-07 DIAGNOSIS — Z1231 Encounter for screening mammogram for malignant neoplasm of breast: Secondary | ICD-10-CM | POA: Diagnosis not present

## 2023-06-10 DIAGNOSIS — M25512 Pain in left shoulder: Secondary | ICD-10-CM | POA: Diagnosis not present

## 2023-06-10 DIAGNOSIS — H401112 Primary open-angle glaucoma, right eye, moderate stage: Secondary | ICD-10-CM | POA: Diagnosis not present

## 2023-06-10 DIAGNOSIS — H5340 Unspecified visual field defects: Secondary | ICD-10-CM | POA: Diagnosis not present

## 2023-06-10 DIAGNOSIS — M75121 Complete rotator cuff tear or rupture of right shoulder, not specified as traumatic: Secondary | ICD-10-CM | POA: Diagnosis not present

## 2023-06-10 NOTE — Progress Notes (Signed)
[facility] Summary Report / Loop Recorder 

## 2023-06-11 DIAGNOSIS — Z23 Encounter for immunization: Secondary | ICD-10-CM | POA: Diagnosis not present

## 2023-06-26 DIAGNOSIS — M75121 Complete rotator cuff tear or rupture of right shoulder, not specified as traumatic: Secondary | ICD-10-CM | POA: Diagnosis not present

## 2023-06-26 DIAGNOSIS — M19111 Post-traumatic osteoarthritis, right shoulder: Secondary | ICD-10-CM | POA: Diagnosis not present

## 2023-06-28 DIAGNOSIS — H43823 Vitreomacular adhesion, bilateral: Secondary | ICD-10-CM | POA: Diagnosis not present

## 2023-06-28 DIAGNOSIS — H401133 Primary open-angle glaucoma, bilateral, severe stage: Secondary | ICD-10-CM | POA: Diagnosis not present

## 2023-06-28 DIAGNOSIS — H43393 Other vitreous opacities, bilateral: Secondary | ICD-10-CM | POA: Diagnosis not present

## 2023-06-28 DIAGNOSIS — E113213 Type 2 diabetes mellitus with mild nonproliferative diabetic retinopathy with macular edema, bilateral: Secondary | ICD-10-CM | POA: Diagnosis not present

## 2023-06-30 LAB — CUP PACEART REMOTE DEVICE CHECK
Date Time Interrogation Session: 20240911013505
Implantable Pulse Generator Implant Date: 20240222

## 2023-07-01 ENCOUNTER — Telehealth: Payer: Self-pay | Admitting: *Deleted

## 2023-07-01 ENCOUNTER — Ambulatory Visit (INDEPENDENT_AMBULATORY_CARE_PROVIDER_SITE_OTHER): Payer: Medicare Other

## 2023-07-01 DIAGNOSIS — R55 Syncope and collapse: Secondary | ICD-10-CM | POA: Diagnosis not present

## 2023-07-01 NOTE — Telephone Encounter (Signed)
Pre-operative Risk Assessment    Patient Name: [name]  DOB: 1941/02/02 MRN: [id number]      Request for Surgical Clearance    Procedure:   RIGHT REVERSE TOTAL SHOULDER ARTHROPLASTY  Date of Surgery:  Clearance TBD                                 Surgeon:  DR. Malon Kindle Surgeon's Group or Practice Name:  [facility] Phone number:  [phone] Fax number:  [phone]   KERRI MAZE    Type of Clearance Requested:   - Medical    Type of Anesthesia:   CHOICE   Additional requests/questions:    [name]   07/01/2023, 9:28 AM

## 2023-07-01 NOTE — Telephone Encounter (Signed)
Name: MALU POLIVKA  DOB: 19-Jul-1941  MRN: [id number]  Primary Cardiologist: Thomasene Ripple, DO  Chart reviewed as part of pre-operative protocol coverage. Because of Adrienne M Gewirtz's past medical history and time since last visit, she will require a follow-up in-office visit in order to better assess preoperative cardiovascular risk.  Pre-op covering staff: - Please schedule appointment and call patient to inform them. If patient already had an upcoming appointment within acceptable timeframe, please add "pre-op clearance" to the appointment notes so provider is aware. - Please contact requesting surgeon's office via preferred method (i.e, phone, fax) to inform them of need for appointment prior to surgery.  No medications need held.  Sharlene Dory, PA-C  07/01/2023, 11:36 AM

## 2023-07-01 NOTE — Telephone Encounter (Signed)
Pt is scheduled for in-office visit for clearance 07/09/23 with Joni Reining, NP at 8:50am.

## 2023-07-05 DIAGNOSIS — Z01818 Encounter for other preprocedural examination: Secondary | ICD-10-CM | POA: Diagnosis not present

## 2023-07-08 NOTE — Progress Notes (Unsigned)
Cardiology Clinic Note   Patient Name: [name] Date of Encounter: 07/09/2023  Primary Care Provider:  [name], MD Primary Cardiologist:  Thomasene Ripple, DO  Patient Profile    82 year old female with history of hypertension, occasional PVCs, with other history include asthma anxiety GERD diabetes and fibromyalgia.  Last seen by Dr. Servando Salina on 04/23/2023.  Coronary CTA February 2023 calcium score of 0.  Echocardiogram 2023 revealed normal LV systolic function with grade 1 diastolic dysfunction no valvular abnormalities.  ILR did not show any evidence of arrhythmia on recent interrogation.  On that last office visit the patient losartan was decreased to 12.5 mg for blood pressure less then 150/90.  Past Medical History    Past Medical History:  Diagnosis Date   Allergy    Anal or rectal pain    sometimes   Anemia    hx of during pregnancy   Anxiety    Arthritis    Asthma    hx of   Bradycardia    " I KNOW I HAVE BRADYCARDIA ESPECIALLY WHEN I SLEEP"    Cataract 2021   bilateral eyes   Chronic back pain    Degenerative joint disease    osteo   Depression    Diabetes mellitus without complication (HCC)    DM type II   Diverticulosis 2003   Dysrhythmia    hx of  due to eye drop and also low heart rate 40's per pt. Dr. Allyson Sabal follows   Elevated total protein    Esophageal dysmotility    Fibromyalgia    GERD (gastroesophageal reflux disease)    subsequent Nissen Fundoplication   Glaucoma    Hearing loss    Heart murmur    "was told she had a heart murmur"   Hemorrhoids    Hiatal hernia 11/08/2009   Hx of adenomatous colonic polyps 07/02/2002   Hypercalcemia    Hyperlipidemia    Hyperlipidemia    Hypertension    Implantable loop recorder present 11/28/2021   Nausea    Osteoporosis    PONV (postoperative nausea and vomiting)    Rectal bleeding    from hemorrhoids.     Sleep apnea    DOES USE CPAP    Thrombocytopenia (HCC)    Varicose veins of left lower  extremity    Past Surgical History:  Procedure Laterality Date   CARDIAC CATHETERIZATION  03/14/1992   Normal cardiac cath. Normal LV function.   CARDIOVASCULAR STRESS TEST  01/22/2011   No scintigraphic evidence of inducible ischemia.   CAROTID DOPPLER  03/31/2007   Bilateral ICAs - no evidence of significant diameter reduction, dissectin, tortuosity, FMD, or any other vascular abnormality.   CATARACT EXTRACTION, BILATERAL     ESOPHAGEAL MANOMETRY N/A 11/14/2015   Procedure: ESOPHAGEAL MANOMETRY (EM);  Surgeon: Napoleon Form, MD;  Location: [facility];  Service: Endoscopy;  Laterality: N/A;   ESOPHAGEAL MANOMETRY N/A 01/18/2021   Procedure: ESOPHAGEAL MANOMETRY (EM);  Surgeon: Napoleon Form, MD;  Location: [facility];  Service: Endoscopy;  Laterality: N/A;   ESOPHAGOGASTRODUODENOSCOPY N/A 03/08/2021   Procedure: ESOPHAGOGASTRODUODENOSCOPY (EGD);  Surgeon: Corliss Skains, MD;  [facility]: [facility] OR;  Service: Thoracic;  Laterality: N/A;   EYE SURGERY     bilateral cataracts; bilateral stents   GASTRIC RESECTION  2009   GLAUCOMA SURGERY     JOINT REPLACEMENT     right knee Dr. Lequita Halt 06-23-18   KNEE SURGERY Bilateral    NISSEN FUNDOPLICATION  2000  with subsequent takedown in 2009   TOTAL KNEE ARTHROPLASTY Left 06/10/2017   Procedure: LEFT  TOTAL KNEE ARTHROPLASTY;  Surgeon: Ollen Gross, MD;  Location: [facility];  Service: Orthopedics;  Laterality: Left;  Adductor Block   TOTAL KNEE ARTHROPLASTY Right 06/23/2018   Procedure: RIGHT TOTAL KNEE ARTHROPLASTY;  Surgeon: Ollen Gross, MD;  Location: [facility];  Service: Orthopedics;  Laterality: Right;   TRANSTHORACIC ECHOCARDIOGRAM  12/21/2010   EF 60%, moderate LVH,    TUBAL LIGATION     XI ROBOTIC ASSISTED HIATAL HERNIA REPAIR N/A 03/08/2021   Procedure: XI ROBOTIC ASSISTED LAPAROSCOPY WITH LYSIS OF ADHESIONS;  Surgeon: Corliss Skains, MD;  Location: [facility] OR;  Service: Thoracic;  Laterality: N/A;    Allergies  Allergies   Allergen Reactions   Adalat [Nifedipine] Other (See Comments)    Tired, Made pt "feel crazy in the head"   Hydrocodone Itching and Nausea And Vomiting   Meperidine Hcl Other (See Comments)    Demerol causes hallucinations   Naproxen Nausea And Vomiting    History of Present Illness    Mrs. Barrilleaux comes today for ongoing assessment and management of hypertension, and preoperative cardiac evaluation for right reverse total shoulder arthroplasty by Dr. Patsy Lager on date to be determined.   She complains today of overall fatigue.  She is not able to go to the grocery store, clean her home, does very little walking and is very sedentary due to chronic musculoskeletal pain, knee pain, and right shoulder pain.  Her husband is her main caretaker does the grocery shopping housecleaning assist her with dressing and bathing.  She is no longer taking Lasix, and only takes losartan for elevated blood pressure greater than 150 systolic.  She has not had to do that very often possibly once a week.  She is to have intermittent chest pressure which is more musculoskeletal in etiology.  She denies significant dyspnea however she is not very exertional.  No dizziness, lightheadedness, or palpitations.  Home Medications    Current Outpatient Medications  Medication Sig Dispense Refill   acetaminophen (TYLENOL) 500 MG tablet Take 1,000 mg by mouth 2 (two) times daily as needed for headache (back and knee pain).     albuterol (VENTOLIN HFA) 108 (90 Base) MCG/ACT inhaler Inhale 1 puff into the lungs as needed for wheezing or shortness of breath.     ascorbic acid (VITAMIN C) 500 MG tablet Take 1 tablet (500 mg total) by mouth daily. 90 tablet 0   brimonidine (ALPHAGAN) 0.2 % ophthalmic solution 1 drop 2 (two) times daily.     cetirizine (ZYRTEC) 10 MG tablet Take 10 mg by mouth at bedtime.     Cholecalciferol (VITAMIN D3) 250 MCG (10000 UT) capsule Take 10,000 Units by mouth daily.     dexlansoprazole  (DEXILANT) 60 MG capsule Take 1 capsule (60 mg total) by mouth daily. 90 capsule 3   hydrocortisone (ANUSOL-HC) 25 MG suppository Place 1 suppository (25 mg total) rectally at bedtime. 5 suppository 1   ketoconazole (NIZORAL) 2 % cream Apply 1 Application topically daily. 60 g 2   latanoprost (XALATAN) 0.005 % ophthalmic solution Place 1 drop into the right eye at bedtime.     losartan (COZAAR) 25 MG tablet Take 0.5 tablets (12.5 mg total) by mouth daily. 45 tablet 3   montelukast (SINGULAIR) 10 MG tablet Take 10 mg by mouth at bedtime.     nystatin ointment (MYCOSTATIN) Apply 1 application  topically See admin instructions. 1  application 1-2 times a day as needed for rash  0   potassium chloride (KLOR-CON) 10 MEQ tablet Take 1 tablet (10 mEq total) by mouth once a week. 13 tablet 3   rosuvastatin (CRESTOR) 40 MG tablet Take 40 mg by mouth at bedtime.     sertraline (ZOLOFT) 100 MG tablet Take 100 mg by mouth every morning.     sucralfate (CARAFATE) 1 g tablet Take 1 tablet (1 g total) by mouth 2 (two) times daily as needed. Do not take within 2 hours of any other medications. 60 tablet 3   zinc sulfate 220 (50 Zn) MG capsule Take 1 capsule (220 mg total) by mouth daily. 90 capsule 0   furosemide (LASIX) 20 MG tablet Take 1 tablet (20 mg total) by mouth once a week. (Patient not taking: Reported on 04/23/2023) 13 tablet 3   Polyethyl Glycol-Propyl Glycol (SYSTANE OP) Place 1 drop into both eyes daily as needed (dry/irritated eyes). (Patient not taking: Reported on 04/23/2023)     No current facility-administered medications for this visit.     Family History    Family History  Problem Relation Age of Onset   Heart disease Mother    Hypertension Mother    Diabetes Mother    Diabetes Father    Kidney disease Father    Stroke Brother    Cancer Maternal Grandmother    Kidney disease Maternal Grandfather    Breast cancer Daughter    Stomach cancer Maternal Aunt    Uterine cancer Maternal Aunt     Leukemia Maternal Uncle    Prostate cancer Maternal Uncle    Colon cancer Neg Hx    Rectal cancer Neg Hx    Esophageal cancer Neg Hx    She indicated that her mother is deceased. She indicated that her father is deceased. She indicated that two of her three brothers are alive. She indicated that her maternal grandmother is deceased. She indicated that her maternal grandfather is deceased. She indicated that her paternal grandmother is deceased. She indicated that her paternal grandfather is deceased. She indicated that only one of her two daughters is alive. She indicated that only one of her two maternal aunts is alive. She indicated that both of her maternal uncles are alive. She indicated that the status of her neg hx is unknown.  Social History    Social History   Socioeconomic History   Marital status: Married    Spouse name: Not on file   Number of children: 8   Years of education: Not on file   Highest education level: Not on file  Occupational History   Occupation: retired    Comment: retired Clinical biochemist.   Tobacco Use   Smoking status: Never   Smokeless tobacco: Never  Vaping Use   Vaping status: Never Used  Substance and Sexual Activity   Alcohol use: No    Alcohol/week: 0.0 standard drinks of alcohol   Drug use: No   Sexual activity: Yes  Other Topics Concern   Not on file  Social History Narrative   Husband, Cathalene Sinclair is Next of Kin. Cell # [phone]   Social Determinants of Health   Financial Resource Strain: Not on file  Food Insecurity: Not on file  Transportation Needs: Not on file  Physical Activity: Not on file  Stress: Not on file  Social Connections: Not on file  Intimate Partner Violence: Not on file     Review of Systems    General:  No chills, fever, night sweats or weight changes.  Profound fatigue and weakness. Cardiovascular:  No chest pain, dyspnea on exertion, edema, orthopnea, palpitations, paroxysmal nocturnal  dyspnea. Dermatological: No rash, lesions/masses Respiratory: No cough, dyspnea Urologic: No hematuria, dysuria Abdominal:   No nausea, vomiting, diarrhea, bright red blood per rectum, melena, or hematemesis Neurologic:  No visual changes, wkns, changes in mental status. All other systems reviewed and are otherwise negative except as noted above.   Physical Exam    VS:  BP 136/86 (BP Location: Left Arm, Patient Position: Sitting, Cuff Size: Normal)   Pulse (!) 56   Ht 5\' 11"  (1.803 m)   Wt 220 lb 6.4 oz (100 kg)   SpO2 98%   BMI 30.74 kg/m  , BMI Body mass index is 30.74 kg/m.     GEN: Well nourished, well developed, in no acute distress. HEENT: normal. Sclera reddened bilaterally.  Neck: Supple, no JVD, carotid bruits, or masses. Cardiac: RRR, bradycardic, no murmurs, rubs, or gallops. No clubbing, cyanosis, edema.  Radials/DP/PT 2+ and equal bilaterally.  Respiratory:  Respirations regular and unlabored, clear to auscultation bilaterally. GI: Soft, nontender, nondistended, BS + x 4. MS: no deformity or atrophy.  Right shoulder pain with minimal movement. Skin: warm and dry, no rash. Neuro:  Strength is diminished uses cane for ambulation, and sensation is intact. Psych: Normal affect.      Lab Results  Component Value Date   WBC 5.3 10/22/2022   HGB 12.1 10/22/2022   HCT 38.0 10/22/2022   MCV 96.2 10/22/2022   PLT 143 (L) 10/22/2022   Lab Results  Component Value Date   CREATININE 1.06 (H) 01/24/2023   BUN 21 01/24/2023   NA 141 01/24/2023   K 4.8 01/24/2023   CL 102 01/24/2023   CO2 25 01/24/2023   Lab Results  Component Value Date   ALT 10 10/22/2022   AST 18 10/22/2022   ALKPHOS 61 10/22/2022   BILITOT 0.4 10/22/2022   Lab Results  Component Value Date   CHOL 200 (H) 10/26/2020   HDL 76 10/26/2020   LDLCALC 115 (H) 10/26/2020   TRIG 46 10/26/2020   CHOLHDL 2.6 10/26/2020    Lab Results  Component Value Date   HGBA1C 6.8 (H) 10/22/2022      Review of Prior Studies    Echocardiogram 10/22/2022  1. Left ventricular ejection fraction, by estimation, is 60 to 65%. The  left ventricle has normal function. The left ventricle has no regional  wall motion abnormalities. There is moderate left ventricular hypertrophy.  Left ventricular diastolic  parameters are consistent with Grade I diastolic dysfunction (impaired  relaxation).   2. Right ventricular systolic function is normal. The right ventricular  size is moderately enlarged. There is mildly elevated pulmonary artery  systolic pressure.   3. Left atrial size was mildly dilated.   4. Right atrial size was moderately dilated.   5. The mitral valve is normal in structure. No evidence of mitral valve  regurgitation. No evidence of mitral stenosis.   6. The aortic valve is normal in structure. There is mild calcification  of the aortic valve. Aortic valve regurgitation is not visualized. Aortic  valve sclerosis is present, with no evidence of aortic valve stenosis.   7. The inferior vena cava is normal in size with greater than 50%  respiratory variability, suggesting right atrial pressure of 3 mmHg.    Assessment & Plan   1.  Pre-Operative Clearance  According to the Revised  Cardiac Risk Index (RCRI), her Perioperative Risk of Major Cardiac Event is (%): 0.9  Her Functional Capacity in METs is: 3.63 according to the Duke Activity Status Index (DASI).  Functional capacity is not related to cardiovascular issues more related to fibromyalgia, generalized deconditioning, and chronic pain.  Will likely have a long postoperative recovery due to significant deconditioning.  I have asked her to ride a recumbent bike 5 minutes every day for a week and then increase it to 10 minutes every day, and increase it by 5-minute intervals to increase her overall stamina to assist with better recovery.  She did be considered for physical therapy once she is postoperative of shoulder surgery for  overall strengthening after physical therapy for shoulder during recovery.  Therefore, based on ACC/AHA guidelines, patient would be at acceptable risk for the planned procedure without further cardiovascular testing. I will route this recommendation to the requesting party via Epic fax function.   2.  Hypertension: She only takes losartan as needed for blood pressure greater than 150/90.  She has not had to do that except for an occasional once a week dose.  No changes in her medication regimen at this time.  She is no longer taking Lasix.  3.  Hypercholesterolemia: Remains on rosuvastatin 40 mg daily.  Doubt muscle aches and pains related to statin induced myalgias.  Labs are followed by PCP.  4.  Sinus bradycardia: Heart rate today 54 bpm.  Patient has ILR in situ.  No episodes of atrial fibrillation, no new symptoms of tachycardia bradycardia or pauses were noted.  She is not on any AV nodal blocking agents.   Signed, Bettey Mare. Liborio Nixon, ANP, AACC   07/09/2023 9:32 AM      Office [phone] Fax [phone]  Notice: This dictation was prepared with Dragon dictation along with smaller phrase technology. Any transcriptional errors that result from this process are unintentional and may not be corrected upon review.

## 2023-07-09 ENCOUNTER — Encounter: Payer: Self-pay | Admitting: Adult Health

## 2023-07-09 ENCOUNTER — Ambulatory Visit: Payer: Medicare Other | Attending: Adult Health | Admitting: Adult Health

## 2023-07-09 VITALS — BP 136/86 | HR 56 | Ht 71.0 in | Wt 220.4 lb

## 2023-07-09 DIAGNOSIS — I10 Essential (primary) hypertension: Secondary | ICD-10-CM | POA: Diagnosis not present

## 2023-07-09 DIAGNOSIS — R001 Bradycardia, unspecified: Secondary | ICD-10-CM | POA: Diagnosis not present

## 2023-07-09 DIAGNOSIS — Z01818 Encounter for other preprocedural examination: Secondary | ICD-10-CM | POA: Insufficient documentation

## 2023-07-09 NOTE — Patient Instructions (Signed)
Medication Instructions:  NO CHANGES    Lab Work: NONE   Testing/Procedures: NONE   Follow-Up: At [facility], you and your health needs are our priority.  As part of our continuing mission to provide you with exceptional heart care, we have created designated Provider Care Teams.  These Care Teams include your primary Cardiologist (physician) and Advanced Practice Providers (APPs -  Physician Assistants and Nurse Practitioners) who all work together to provide you with the care you need, when you need it.  We recommend signing up for the patient portal called "MyChart".  Sign up information is provided on this After Visit Summary.  MyChart is used to connect with patients for Virtual Visits (Telemedicine).  Patients are able to view lab/test results, encounter notes, upcoming appointments, etc.  Non-urgent messages can be sent to your provider as well.   To learn more about what you can do with MyChart, go to [email or web address].    Your next appointment:   KEEP UPCOMING APPOINTMENT   Provider:   Thomasene Ripple, DO   Other Instructions YOU MAY RIDE YOUR BIKE STARTING AT 5 MINUTES EACH DAY FOR ONE WEEK THEN GOING UP TO 10 MINUTES EACH DAY FOR ONE WEEK.

## 2023-07-15 NOTE — Progress Notes (Signed)
[facility] Summary Report / Loop Recorder 

## 2023-07-29 DIAGNOSIS — I10 Essential (primary) hypertension: Secondary | ICD-10-CM | POA: Diagnosis not present

## 2023-07-29 DIAGNOSIS — E119 Type 2 diabetes mellitus without complications: Secondary | ICD-10-CM | POA: Diagnosis not present

## 2023-07-29 DIAGNOSIS — E785 Hyperlipidemia, unspecified: Secondary | ICD-10-CM | POA: Diagnosis not present

## 2023-07-30 ENCOUNTER — Ambulatory Visit (INDEPENDENT_AMBULATORY_CARE_PROVIDER_SITE_OTHER): Payer: Medicare Other

## 2023-07-30 ENCOUNTER — Ambulatory Visit (INDEPENDENT_AMBULATORY_CARE_PROVIDER_SITE_OTHER): Payer: Medicare Other | Admitting: Podiatry

## 2023-07-30 ENCOUNTER — Encounter: Payer: Self-pay | Admitting: Podiatry

## 2023-07-30 DIAGNOSIS — M79672 Pain in left foot: Secondary | ICD-10-CM | POA: Diagnosis not present

## 2023-07-30 DIAGNOSIS — M79674 Pain in right toe(s): Secondary | ICD-10-CM

## 2023-07-30 DIAGNOSIS — S92355A Nondisplaced fracture of fifth metatarsal bone, left foot, initial encounter for closed fracture: Secondary | ICD-10-CM

## 2023-07-30 DIAGNOSIS — B351 Tinea unguium: Secondary | ICD-10-CM | POA: Diagnosis not present

## 2023-07-30 DIAGNOSIS — I739 Peripheral vascular disease, unspecified: Secondary | ICD-10-CM | POA: Diagnosis not present

## 2023-07-30 DIAGNOSIS — M79675 Pain in left toe(s): Secondary | ICD-10-CM | POA: Diagnosis not present

## 2023-07-30 MED ORDER — OXYCODONE-ACETAMINOPHEN 5-325 MG PO TABS: 1.0000 | ORAL_TABLET | ORAL | 0 refills | Status: AC | PRN

## 2023-07-30 NOTE — Progress Notes (Signed)
Subjective:  Patient ID: [name], female    DOB: 22-Apr-1941,   MRN: [id number]  Chief Complaint  Patient presents with   Nail Problem    RFC.    82 y.o. female presents for concern of thickened elongated and painful nails that are difficult to trim. Requesting to have them trimmed today. History of PAD and at risk for foot care.  Relates she did have a fall this morning and landed on her foot. Relates the foot went numb and now been painful on the outside.   PCP:  [name], MD    . Denies any other pedal complaints. Denies n/v/f/c.   Past Medical History:  Diagnosis Date   Allergy    Anal or rectal pain    sometimes   Anemia    hx of during pregnancy   Anxiety    Arthritis    Asthma    hx of   Bradycardia    " I KNOW I HAVE BRADYCARDIA ESPECIALLY WHEN I SLEEP"    Cataract 2021   bilateral eyes   Chronic back pain    Degenerative joint disease    osteo   Depression    Diabetes mellitus without complication (HCC)    DM type II   Diverticulosis 2003   Dysrhythmia    hx of  due to eye drop and also low heart rate 40's per pt. Dr. Allyson Sabal follows   Elevated total protein    Esophageal dysmotility    Fibromyalgia    GERD (gastroesophageal reflux disease)    subsequent Nissen Fundoplication   Glaucoma    Hearing loss    Heart murmur    "was told she had a heart murmur"   Hemorrhoids    Hiatal hernia 11/08/2009   Hx of adenomatous colonic polyps 07/02/2002   Hypercalcemia    Hyperlipidemia    Hyperlipidemia    Hypertension    Implantable loop recorder present 11/28/2021   Nausea    Osteoporosis    PONV (postoperative nausea and vomiting)    Rectal bleeding    from hemorrhoids.     Sleep apnea    DOES USE CPAP    Thrombocytopenia (HCC)    Varicose veins of left lower extremity     Objective:  Physical Exam: Vascular: DP/PT pulses 1/4 bilateral. CFT <3 seconds. Normal hair growth on digits. No edema.  Skin. No lacerations or abrasions  bilateral feet. Nails 1-5 bilateral are thickened elongated and with subungual debris. Scaling and erythema noted to mostly left plantar feet.  Musculoskeletal: MMT 5/5 bilateral lower extremities in DF, PF, Inversion and Eversion. Deceased ROM in DF of ankle joint. Collapse of medial arch bilateral with eversion of calcaneus. Tender to the latearl aspect of the foot around the fifth metatarsal diaphysis and proximally along the peroneal tendon insertion. No pain with ROM of the fifth digit. Most pain over proximal process of fifth metatarsal.  Neurological: Sensation intact to light touch.   Assessment:   1. Closed nondisplaced fracture of fifth metatarsal bone of left foot, initial encounter   2. Pain due to onychomycosis of toenails of both feet   3. PVD (peripheral vascular disease) (HCC)       Plan:  Patient was evaluated and treated and all questions answered. -Mechanically debrided all nails 1-5 bilateral using sterile nail nipper and filed with dremel without incident  -Answered all patient questions -Xrays reviewed. Acute fracture noted to proximal fifth metatarsal noted through lateral half of bone  just distal to tarsometatarsal joint.  -Discussed treatement options for fifth metatarsal fracture; risks, alternatives, and benefits explained. -Discussed this type of fracture is typically surgical corrected but discussed given history of PVD would be risky to try and correct and issues with healing. Discussed conservative treatments for now.  -Dispensed CAM boot. Patient to wear at all times and instructed on use -Recommend protection, rest, ice, elevation daily until symptoms improve -Tylenol as needed. Prescription Precocet provided x 20.  -Patient to return to office in 4 weeks for serial x-rays to assess healing  or sooner if condition worsens.   Louann Sjogren, DPM

## 2023-08-01 DIAGNOSIS — E782 Mixed hyperlipidemia: Secondary | ICD-10-CM | POA: Diagnosis not present

## 2023-08-01 DIAGNOSIS — R55 Syncope and collapse: Secondary | ICD-10-CM | POA: Diagnosis not present

## 2023-08-01 DIAGNOSIS — I10 Essential (primary) hypertension: Secondary | ICD-10-CM | POA: Diagnosis not present

## 2023-08-01 DIAGNOSIS — Z789 Other specified health status: Secondary | ICD-10-CM | POA: Diagnosis not present

## 2023-08-01 DIAGNOSIS — S99199A Other physeal fracture of unspecified metatarsal, initial encounter for closed fracture: Secondary | ICD-10-CM | POA: Diagnosis not present

## 2023-08-01 DIAGNOSIS — E1165 Type 2 diabetes mellitus with hyperglycemia: Secondary | ICD-10-CM | POA: Diagnosis not present

## 2023-08-02 ENCOUNTER — Telehealth: Payer: Self-pay

## 2023-08-02 NOTE — Progress Notes (Unsigned)
Cardiology Office Note:  .   Date:  08/05/2023  ID:  [name], DOB [birth year], MRN [id number] PCP: [name], MD  [facility] Providers Cardiologist:  Dr. Servando Salina  }   History of Present Illness: .   [name] is a 82 y.o. female with history of hypertension, occasional PVCs, with other history include asthma anxiety GERD diabetes and fibromyalgia. Coronary CTA February 2023 calcium score of 0. Echocardiogram 2023 revealed normal LV systolic function with grade 1 diastolic dysfunction no valvular abnormalities.  Last seen on 07/09/2023 for preoperative evaluation and was cleared from a cardiac standpoint.  Apparently the patient fell and called her PCP who referred her back to cardiology for syncopal episode.  Patient states that she was in her usual state of health, was walking to the front door as her son had left the door open, and the next thing she remembers she had fallen, had her feet tangled up in a lamp cord, and had severe pain in her left ankle.  She denies any palpitations, heart racing, chest pain, or aura prior to this episode.  Her husband who is with her states that she staggers a lot.  She is fractured her right ankle and is to be followed by orthopedic surgeon.  She is still planning to have surgery on her right shoulder from November 2022.  She had been cleared by cardiology to have this completed.  She is not taking furosemide any longer nor she on any losartan daily.  She is being followed by PCP for diabetes and has not had any significant drops in her blood sugar.  ROS: As above otherwise negative.  Studies Reviewed: .       Echocardiogram 10/22/2022  1. Left ventricular ejection fraction, by estimation, is 60 to 65%. The  left ventricle has normal function. The left ventricle has no regional  wall motion abnormalities. There is moderate left ventricular hypertrophy.  Left ventricular diastolic  parameters are consistent with Grade I diastolic  dysfunction (impaired  relaxation).   2. Right ventricular systolic function is normal. The right ventricular  size is moderately enlarged. There is mildly elevated pulmonary artery  systolic pressure.   3. Left atrial size was mildly dilated.   4. Right atrial size was moderately dilated.   5. The mitral valve is normal in structure. No evidence of mitral valve  regurgitation. No evidence of mitral stenosis.   6. The aortic valve is normal in structure. There is mild calcification  of the aortic valve. Aortic valve regurgitation is not visualized. Aortic  valve sclerosis is present, with no evidence of aortic valve stenosis.   7. The inferior vena cava is normal in size with greater than 50%  respiratory variability, suggesting right atrial pressure of 3 mmHg.    Physical Exam:   VS:  BP (!) 142/76   Pulse 60   Ht 5\' 11"  (1.803 m)   SpO2 99%   BMI 30.74 kg/m    Wt Readings from Last 3 Encounters:  07/09/23 220 lb 6.4 oz (100 kg)  04/23/23 221 lb 6.4 oz (100.4 kg)  03/07/23 216 lb 12.8 oz (98.3 kg)    GEN: Well nourished, well developed in no acute distress NECK: No JVD; No carotid bruits CARDIAC: RRR, no murmurs, rubs, gallops RESPIRATORY:  Clear to auscultation without rales, wheezing or rhonchi  ABDOMEN: Soft, non-tender, non-distended EXTREMITIES: Boot to her left foot and ankle.  ASSESSMENT AND PLAN: .    Syncopal  episode: ILR device check has occurred this a.m. but results are not available.  Will review her results when available to evaluate for pauses, ventricular tachycardia, or significant bradycardia leading to syncopal episode.  In the interim I will check an MRI of the brain to evaluate for cerebrovascular disease and possible TIAs associated with her syncopal episodes.  2.  Hypertension: Patient currently is not on any antihypertension medication unless blood pressures greater than 160/90.  She has as needed losartan 25 mg only.  She is no longer taking Lasix.   Blood pressure is controlled today.  3.  Non-insulin-dependent diabetes: Followed by PCP.  Last hemoglobin A1c was 7.  She was recently started on metformin.  Syncopal episode occurred prior to metformin being instituted.  4.  Hypercholesterolemia: Currently on rosuvastatin 40 mg daily.  Labs are followed by PCP.  Goal of LDL less than 100.    Signed, Bettey Mare. Liborio Nixon, ANP, AACC

## 2023-08-02 NOTE — Telephone Encounter (Signed)
Called patient regarding appointment on 10/21 . Calling to advise patient appointment can be canceled or rescyeduled since patient had been in office on 07/09/23 for preoperative clearance.

## 2023-08-05 ENCOUNTER — Ambulatory Visit: Payer: Medicare Other

## 2023-08-05 ENCOUNTER — Telehealth: Payer: Self-pay

## 2023-08-05 ENCOUNTER — Ambulatory Visit (INDEPENDENT_AMBULATORY_CARE_PROVIDER_SITE_OTHER): Payer: Medicare Other | Admitting: Adult Health

## 2023-08-05 ENCOUNTER — Encounter: Payer: Self-pay | Admitting: Adult Health

## 2023-08-05 VITALS — BP 142/76 | HR 60 | Ht 71.0 in

## 2023-08-05 DIAGNOSIS — R55 Syncope and collapse: Secondary | ICD-10-CM

## 2023-08-05 DIAGNOSIS — G4730 Sleep apnea, unspecified: Secondary | ICD-10-CM | POA: Diagnosis not present

## 2023-08-05 DIAGNOSIS — I63131 Cerebral infarction due to embolism of right carotid artery: Secondary | ICD-10-CM | POA: Diagnosis not present

## 2023-08-05 DIAGNOSIS — I671 Cerebral aneurysm, nonruptured: Secondary | ICD-10-CM | POA: Diagnosis not present

## 2023-08-05 DIAGNOSIS — G459 Transient cerebral ischemic attack, unspecified: Secondary | ICD-10-CM | POA: Insufficient documentation

## 2023-08-05 DIAGNOSIS — K219 Gastro-esophageal reflux disease without esophagitis: Secondary | ICD-10-CM | POA: Diagnosis not present

## 2023-08-05 DIAGNOSIS — I6523 Occlusion and stenosis of bilateral carotid arteries: Secondary | ICD-10-CM | POA: Diagnosis not present

## 2023-08-05 DIAGNOSIS — I10 Essential (primary) hypertension: Secondary | ICD-10-CM | POA: Diagnosis not present

## 2023-08-05 DIAGNOSIS — I6389 Other cerebral infarction: Secondary | ICD-10-CM | POA: Diagnosis not present

## 2023-08-05 DIAGNOSIS — E7800 Pure hypercholesterolemia, unspecified: Secondary | ICD-10-CM | POA: Insufficient documentation

## 2023-08-05 DIAGNOSIS — Z79899 Other long term (current) drug therapy: Secondary | ICD-10-CM | POA: Diagnosis not present

## 2023-08-05 DIAGNOSIS — Z8049 Family history of malignant neoplasm of other genital organs: Secondary | ICD-10-CM | POA: Diagnosis not present

## 2023-08-05 DIAGNOSIS — I6503 Occlusion and stenosis of bilateral vertebral arteries: Secondary | ICD-10-CM | POA: Diagnosis not present

## 2023-08-05 DIAGNOSIS — Z823 Family history of stroke: Secondary | ICD-10-CM | POA: Diagnosis not present

## 2023-08-05 DIAGNOSIS — I6521 Occlusion and stenosis of right carotid artery: Secondary | ICD-10-CM | POA: Diagnosis not present

## 2023-08-05 DIAGNOSIS — G8194 Hemiplegia, unspecified affecting left nondominant side: Secondary | ICD-10-CM | POA: Diagnosis present

## 2023-08-05 DIAGNOSIS — I639 Cerebral infarction, unspecified: Secondary | ICD-10-CM | POA: Diagnosis not present

## 2023-08-05 DIAGNOSIS — Z806 Family history of leukemia: Secondary | ICD-10-CM | POA: Diagnosis not present

## 2023-08-05 DIAGNOSIS — H9192 Unspecified hearing loss, left ear: Secondary | ICD-10-CM | POA: Diagnosis present

## 2023-08-05 DIAGNOSIS — I6300 Cerebral infarction due to thrombosis of unspecified precerebral artery: Secondary | ICD-10-CM | POA: Diagnosis not present

## 2023-08-05 DIAGNOSIS — M797 Fibromyalgia: Secondary | ICD-10-CM | POA: Diagnosis present

## 2023-08-05 DIAGNOSIS — I6522 Occlusion and stenosis of left carotid artery: Secondary | ICD-10-CM | POA: Diagnosis not present

## 2023-08-05 DIAGNOSIS — N1831 Chronic kidney disease, stage 3a: Secondary | ICD-10-CM | POA: Diagnosis not present

## 2023-08-05 DIAGNOSIS — I6359 Cerebral infarction due to unspecified occlusion or stenosis of other cerebral artery: Secondary | ICD-10-CM | POA: Diagnosis present

## 2023-08-05 DIAGNOSIS — Z6832 Body mass index (BMI) 32.0-32.9, adult: Secondary | ICD-10-CM | POA: Diagnosis not present

## 2023-08-05 DIAGNOSIS — M4802 Spinal stenosis, cervical region: Secondary | ICD-10-CM | POA: Diagnosis not present

## 2023-08-05 DIAGNOSIS — I63511 Cerebral infarction due to unspecified occlusion or stenosis of right middle cerebral artery: Secondary | ICD-10-CM | POA: Diagnosis not present

## 2023-08-05 DIAGNOSIS — J45909 Unspecified asthma, uncomplicated: Secondary | ICD-10-CM | POA: Diagnosis present

## 2023-08-05 DIAGNOSIS — M4803 Spinal stenosis, cervicothoracic region: Secondary | ICD-10-CM | POA: Diagnosis not present

## 2023-08-05 DIAGNOSIS — F32A Depression, unspecified: Secondary | ICD-10-CM | POA: Diagnosis present

## 2023-08-05 DIAGNOSIS — Z841 Family history of disorders of kidney and ureter: Secondary | ICD-10-CM | POA: Diagnosis not present

## 2023-08-05 DIAGNOSIS — E119 Type 2 diabetes mellitus without complications: Secondary | ICD-10-CM | POA: Diagnosis present

## 2023-08-05 DIAGNOSIS — R531 Weakness: Secondary | ICD-10-CM | POA: Diagnosis not present

## 2023-08-05 DIAGNOSIS — Z96653 Presence of artificial knee joint, bilateral: Secondary | ICD-10-CM | POA: Diagnosis present

## 2023-08-05 DIAGNOSIS — M50223 Other cervical disc displacement at C6-C7 level: Secondary | ICD-10-CM | POA: Diagnosis not present

## 2023-08-05 DIAGNOSIS — F320 Major depressive disorder, single episode, mild: Secondary | ICD-10-CM | POA: Diagnosis not present

## 2023-08-05 DIAGNOSIS — S92355D Nondisplaced fracture of fifth metatarsal bone, left foot, subsequent encounter for fracture with routine healing: Secondary | ICD-10-CM | POA: Diagnosis not present

## 2023-08-05 DIAGNOSIS — I63421 Cerebral infarction due to embolism of right anterior cerebral artery: Secondary | ICD-10-CM | POA: Diagnosis not present

## 2023-08-05 DIAGNOSIS — I129 Hypertensive chronic kidney disease with stage 1 through stage 4 chronic kidney disease, or unspecified chronic kidney disease: Secondary | ICD-10-CM | POA: Diagnosis not present

## 2023-08-05 DIAGNOSIS — Z8249 Family history of ischemic heart disease and other diseases of the circulatory system: Secondary | ICD-10-CM | POA: Diagnosis not present

## 2023-08-05 DIAGNOSIS — Z803 Family history of malignant neoplasm of breast: Secondary | ICD-10-CM | POA: Diagnosis not present

## 2023-08-05 DIAGNOSIS — R29703 NIHSS score 3: Secondary | ICD-10-CM | POA: Diagnosis present

## 2023-08-05 DIAGNOSIS — M810 Age-related osteoporosis without current pathological fracture: Secondary | ICD-10-CM | POA: Diagnosis present

## 2023-08-05 DIAGNOSIS — Z7984 Long term (current) use of oral hypoglycemic drugs: Secondary | ICD-10-CM | POA: Diagnosis not present

## 2023-08-05 LAB — CUP PACEART REMOTE DEVICE CHECK
Date Time Interrogation Session: 20241020231815
Implantable Pulse Generator Implant Date: 20240222

## 2023-08-05 NOTE — Telephone Encounter (Signed)
Called patient to advise would not have to come for appointment on 08/05/23 due patient was seen 1 month prior and was cleared for surgery. Per provider patient did not need to be seen.

## 2023-08-05 NOTE — Patient Instructions (Signed)
Medication Instructions:  No Changes *If you need a refill on your cardiac medications before your next appointment, please call your pharmacy*   Lab Work: BMET If you have labs (blood work) drawn today and your tests are completely normal, you will receive your results only by: MyChart Message (if you have MyChart) OR A paper copy in the mail If you have any lab test that is abnormal or we need to change your treatment, we will call you to review the results.   Testing/Procedures: [facility]. Your physician has requested that you have a  MRI.    Follow-Up: At [facility], you and your health needs are our priority.  As part of our continuing mission to provide you with exceptional heart care, we have created designated Provider Care Teams.  These Care Teams include your primary Cardiologist (physician) and Advanced Practice Providers (APPs -  Physician Assistants and Nurse Practitioners) who all work together to provide you with the care you need, when you need it.  We recommend signing up for the patient portal called "MyChart".  Sign up information is provided on this After Visit Summary.  MyChart is used to connect with patients for Virtual Visits (Telemedicine).  Patients are able to view lab/test results, encounter notes, upcoming appointments, etc.  Non-urgent messages can be sent to your provider as well.   To learn more about what you can do with MyChart, go to [email or web address].    Your next appointment:   Keep Scheduled Appointment  Provider:   Thomasene Ripple, DO

## 2023-08-06 ENCOUNTER — Emergency Department (HOSPITAL_COMMUNITY): Payer: Medicare Other

## 2023-08-06 ENCOUNTER — Other Ambulatory Visit: Payer: Self-pay

## 2023-08-06 ENCOUNTER — Telehealth: Payer: Self-pay | Admitting: Cardiology

## 2023-08-06 ENCOUNTER — Inpatient Hospital Stay (HOSPITAL_COMMUNITY)
Admission: EM | Admit: 2023-08-06 | Discharge: 2023-08-12 | DRG: 035 | Disposition: A | Discharge status: Home / Self Care | Payer: Medicare Other | Attending: Internal Medicine | Admitting: Internal Medicine

## 2023-08-06 DIAGNOSIS — I63421 Cerebral infarction due to embolism of right anterior cerebral artery: Secondary | ICD-10-CM | POA: Diagnosis not present

## 2023-08-06 DIAGNOSIS — S92355D Nondisplaced fracture of fifth metatarsal bone, left foot, subsequent encounter for fracture with routine healing: Secondary | ICD-10-CM

## 2023-08-06 DIAGNOSIS — Z8249 Family history of ischemic heart disease and other diseases of the circulatory system: Secondary | ICD-10-CM

## 2023-08-06 DIAGNOSIS — K219 Gastro-esophageal reflux disease without esophagitis: Secondary | ICD-10-CM | POA: Diagnosis present

## 2023-08-06 DIAGNOSIS — Z833 Family history of diabetes mellitus: Secondary | ICD-10-CM

## 2023-08-06 DIAGNOSIS — M4802 Spinal stenosis, cervical region: Secondary | ICD-10-CM | POA: Diagnosis not present

## 2023-08-06 DIAGNOSIS — Z803 Family history of malignant neoplasm of breast: Secondary | ICD-10-CM

## 2023-08-06 DIAGNOSIS — M50223 Other cervical disc displacement at C6-C7 level: Secondary | ICD-10-CM | POA: Diagnosis not present

## 2023-08-06 DIAGNOSIS — I6501 Occlusion and stenosis of right vertebral artery: Secondary | ICD-10-CM | POA: Diagnosis present

## 2023-08-06 DIAGNOSIS — I63131 Cerebral infarction due to embolism of right carotid artery: Secondary | ICD-10-CM | POA: Diagnosis not present

## 2023-08-06 DIAGNOSIS — J45909 Unspecified asthma, uncomplicated: Secondary | ICD-10-CM | POA: Diagnosis present

## 2023-08-06 DIAGNOSIS — E66811 Obesity, class 1: Secondary | ICD-10-CM | POA: Diagnosis present

## 2023-08-06 DIAGNOSIS — Z806 Family history of leukemia: Secondary | ICD-10-CM

## 2023-08-06 DIAGNOSIS — F320 Major depressive disorder, single episode, mild: Secondary | ICD-10-CM

## 2023-08-06 DIAGNOSIS — E669 Obesity, unspecified: Secondary | ICD-10-CM | POA: Diagnosis present

## 2023-08-06 DIAGNOSIS — I639 Cerebral infarction, unspecified: Principal | ICD-10-CM | POA: Diagnosis present

## 2023-08-06 DIAGNOSIS — Z800 Family history of malignant neoplasm of digestive organs: Secondary | ICD-10-CM

## 2023-08-06 DIAGNOSIS — I10 Essential (primary) hypertension: Secondary | ICD-10-CM | POA: Diagnosis not present

## 2023-08-06 DIAGNOSIS — F32A Depression, unspecified: Secondary | ICD-10-CM | POA: Diagnosis present

## 2023-08-06 DIAGNOSIS — G8194 Hemiplegia, unspecified affecting left nondominant side: Secondary | ICD-10-CM | POA: Diagnosis present

## 2023-08-06 DIAGNOSIS — Z823 Family history of stroke: Secondary | ICD-10-CM

## 2023-08-06 DIAGNOSIS — Z6832 Body mass index (BMI) 32.0-32.9, adult: Secondary | ICD-10-CM

## 2023-08-06 DIAGNOSIS — I6359 Cerebral infarction due to unspecified occlusion or stenosis of other cerebral artery: Principal | ICD-10-CM | POA: Diagnosis present

## 2023-08-06 DIAGNOSIS — Z8049 Family history of malignant neoplasm of other genital organs: Secondary | ICD-10-CM

## 2023-08-06 DIAGNOSIS — M4803 Spinal stenosis, cervicothoracic region: Secondary | ICD-10-CM | POA: Diagnosis not present

## 2023-08-06 DIAGNOSIS — Z7984 Long term (current) use of oral hypoglycemic drugs: Secondary | ICD-10-CM

## 2023-08-06 DIAGNOSIS — E119 Type 2 diabetes mellitus without complications: Secondary | ICD-10-CM | POA: Diagnosis present

## 2023-08-06 DIAGNOSIS — Z841 Family history of disorders of kidney and ureter: Secondary | ICD-10-CM

## 2023-08-06 DIAGNOSIS — Z885 Allergy status to narcotic agent status: Secondary | ICD-10-CM

## 2023-08-06 DIAGNOSIS — R531 Weakness: Principal | ICD-10-CM

## 2023-08-06 DIAGNOSIS — I6521 Occlusion and stenosis of right carotid artery: Secondary | ICD-10-CM

## 2023-08-06 DIAGNOSIS — Z96653 Presence of artificial knee joint, bilateral: Secondary | ICD-10-CM | POA: Diagnosis present

## 2023-08-06 DIAGNOSIS — S92355A Nondisplaced fracture of fifth metatarsal bone, left foot, initial encounter for closed fracture: Secondary | ICD-10-CM

## 2023-08-06 DIAGNOSIS — Z79899 Other long term (current) drug therapy: Secondary | ICD-10-CM

## 2023-08-06 DIAGNOSIS — M797 Fibromyalgia: Secondary | ICD-10-CM | POA: Diagnosis present

## 2023-08-06 DIAGNOSIS — I671 Cerebral aneurysm, nonruptured: Secondary | ICD-10-CM | POA: Diagnosis present

## 2023-08-06 DIAGNOSIS — M810 Age-related osteoporosis without current pathological fracture: Secondary | ICD-10-CM | POA: Diagnosis present

## 2023-08-06 DIAGNOSIS — E7800 Pure hypercholesterolemia, unspecified: Secondary | ICD-10-CM | POA: Diagnosis present

## 2023-08-06 DIAGNOSIS — R29703 NIHSS score 3: Secondary | ICD-10-CM | POA: Diagnosis present

## 2023-08-06 DIAGNOSIS — H9192 Unspecified hearing loss, left ear: Secondary | ICD-10-CM | POA: Diagnosis present

## 2023-08-06 LAB — CBC
HCT: 42.9 % (ref 36.0–46.0)
Hemoglobin: 14.1 g/dL (ref 12.0–15.0)
MCH: 31.6 pg (ref 26.0–34.0)
MCHC: 32.9 g/dL (ref 30.0–36.0)
MCV: 96.2 fL (ref 80.0–100.0)
Platelets: 179 10*3/uL (ref 150–400)
RBC: 4.46 MIL/uL (ref 3.87–5.11)
RDW: 13.2 % (ref 11.5–15.5)
WBC: 5.1 10*3/uL (ref 4.0–10.5)
nRBC: 0 % (ref 0.0–0.2)

## 2023-08-06 LAB — URINALYSIS, ROUTINE W REFLEX MICROSCOPIC
Bilirubin Urine: NEGATIVE
Glucose, UA: NEGATIVE mg/dL
Hgb urine dipstick: NEGATIVE
Ketones, ur: NEGATIVE mg/dL
Leukocytes,Ua: NEGATIVE
Nitrite: NEGATIVE
Protein, ur: NEGATIVE mg/dL
Specific Gravity, Urine: 1.012 (ref 1.005–1.030)
pH: 6 (ref 5.0–8.0)

## 2023-08-06 LAB — COMPREHENSIVE METABOLIC PANEL
ALT: 20 U/L (ref 0–44)
AST: 33 U/L (ref 15–41)
Albumin: 3.6 g/dL (ref 3.5–5.0)
Alkaline Phosphatase: 69 U/L (ref 38–126)
Anion gap: 7 (ref 5–15)
BUN: 15 mg/dL (ref 8–23)
CO2: 26 mmol/L (ref 22–32)
Calcium: 9.3 mg/dL (ref 8.9–10.3)
Chloride: 105 mmol/L (ref 98–111)
Creatinine, Ser: 0.99 mg/dL (ref 0.44–1.00)
GFR, Estimated: 58 mL/min — ABNORMAL LOW (ref >60)
Glucose, Bld: 122 mg/dL — ABNORMAL HIGH (ref 70–99)
Potassium: 4.4 mmol/L (ref 3.5–5.1)
Sodium: 138 mmol/L (ref 135–145)
Total Bilirubin: 0.7 mg/dL (ref 0.3–1.2)
Total Protein: 6.7 g/dL (ref 6.5–8.1)

## 2023-08-06 LAB — DIFFERENTIAL
Abs Immature Granulocytes: 0.01 10*3/uL (ref 0.00–0.07)
Basophils Absolute: 0 10*3/uL (ref 0.0–0.1)
Basophils Relative: 0 %
Eosinophils Absolute: 0.4 10*3/uL (ref 0.0–0.5)
Eosinophils Relative: 7 %
Immature Granulocytes: 0 %
Lymphocytes Relative: 39 %
Lymphs Abs: 2 10*3/uL (ref 0.7–4.0)
Monocytes Absolute: 0.4 10*3/uL (ref 0.1–1.0)
Monocytes Relative: 9 %
Neutro Abs: 2.3 10*3/uL (ref 1.7–7.7)
Neutrophils Relative %: 45 %

## 2023-08-06 LAB — RAPID URINE DRUG SCREEN, HOSP PERFORMED
Amphetamines: NOT DETECTED
Barbiturates: NOT DETECTED
Benzodiazepines: NOT DETECTED
Cocaine: NOT DETECTED
Opiates: NOT DETECTED
Tetrahydrocannabinol: NOT DETECTED

## 2023-08-06 LAB — ETHANOL: Alcohol, Ethyl (B): <10 mg/dL (ref <10)

## 2023-08-06 LAB — BASIC METABOLIC PANEL
BUN/Creatinine Ratio: 20 (ref 12–28)
BUN: 18 mg/dL (ref 8–27)
CO2: 27 mmol/L (ref 20–29)
Calcium: 10.1 mg/dL (ref 8.7–10.3)
Chloride: 103 mmol/L (ref 96–106)
Creatinine, Ser: 0.91 mg/dL (ref 0.57–1.00)
Glucose: 116 mg/dL — ABNORMAL HIGH (ref 70–99)
Potassium: 4.4 mmol/L (ref 3.5–5.2)
Sodium: 141 mmol/L (ref 134–144)
eGFR: 64 mL/min/{1.73_m2} (ref >59)

## 2023-08-06 LAB — I-STAT CHEM 8, ED
BUN: 17 mg/dL (ref 8–23)
Calcium, Ion: 1.18 mmol/L (ref 1.15–1.40)
Chloride: 104 mmol/L (ref 98–111)
Creatinine, Ser: 1.1 mg/dL — ABNORMAL HIGH (ref 0.44–1.00)
Glucose, Bld: 127 mg/dL — ABNORMAL HIGH (ref 70–99)
HCT: 44 % (ref 36.0–46.0)
Hemoglobin: 15 g/dL (ref 12.0–15.0)
Potassium: 4.3 mmol/L (ref 3.5–5.1)
Sodium: 140 mmol/L (ref 135–145)
TCO2: 26 mmol/L (ref 22–32)

## 2023-08-06 LAB — PROTIME-INR
INR: 1 (ref 0.8–1.2)
Prothrombin Time: 13.7 s (ref 11.4–15.2)

## 2023-08-06 LAB — APTT: aPTT: 24 s (ref 24–36)

## 2023-08-06 MED ORDER — STROKE: EARLY STAGES OF RECOVERY BOOK
Freq: Once | Status: AC
Start: 2023-08-07 — End: 2023-08-07
  Administered 2023-08-07: 1
  Filled 2023-08-06: qty 1

## 2023-08-06 MED ORDER — ACETAMINOPHEN 325 MG PO TABS
650.0000 mg | ORAL_TABLET | ORAL | Status: DC | PRN
  Administered 2023-08-07 – 2023-08-12 (×11): 650 mg via ORAL
  Filled 2023-08-06 (×11): qty 2

## 2023-08-06 MED ORDER — SENNOSIDES-DOCUSATE SODIUM 8.6-50 MG PO TABS
1.0000 | ORAL_TABLET | Freq: Every evening | ORAL | Status: DC | PRN
Start: 2023-08-06 — End: 2023-08-12
  Administered 2023-08-11: 1 via ORAL
  Filled 2023-08-06: qty 1

## 2023-08-06 MED ORDER — ACETAMINOPHEN 160 MG/5ML PO SOLN
650.0000 mg | ORAL | Status: DC | PRN
  Filled 2023-08-06: qty 20.3

## 2023-08-06 MED ORDER — ENOXAPARIN SODIUM 40 MG/0.4ML IJ SOSY
40.0000 mg | PREFILLED_SYRINGE | INTRAMUSCULAR | Status: DC
  Administered 2023-08-07 – 2023-08-08 (×2): 40 mg via SUBCUTANEOUS
  Filled 2023-08-06 (×3): qty 0.4

## 2023-08-06 MED ORDER — ACETAMINOPHEN 650 MG RE SUPP: 650.0000 mg | RECTAL | Status: DC | PRN

## 2023-08-06 MED ORDER — ASPIRIN 81 MG PO CHEW: 81.0000 mg | CHEWABLE_TABLET | Freq: Every day | ORAL | Status: DC

## 2023-08-06 MED ORDER — CLOPIDOGREL BISULFATE 75 MG PO TABS: 75.0000 mg | ORAL_TABLET | Freq: Every day | ORAL | Status: DC

## 2023-08-06 MED ORDER — ASPIRIN 81 MG PO TBEC
81.0000 mg | DELAYED_RELEASE_TABLET | Freq: Every day | ORAL | Status: DC
  Administered 2023-08-07 – 2023-08-12 (×6): 81 mg via ORAL
  Filled 2023-08-06 (×6): qty 1

## 2023-08-06 MED ORDER — ASPIRIN 300 MG RE SUPP: 300.0000 mg | Freq: Every day | RECTAL | Status: DC

## 2023-08-06 NOTE — H&P (Signed)
History and Physical    Patient: [name] VHQ:[id number] DOB: November 24, 1940 DOA: 08/06/2023 DOS: the patient was seen and examined on 08/07/2023 PCP: [name], MD  Patient coming from: Home  Chief Complaint:  Chief Complaint  Patient presents with   Weakness   HPI: LIVI AIME is a 82 y.o. female with medical history significant of HTN, T2DM, HLD, anxiety, GERD, recurrent syncope s/p loop recorder who presents with intermittent LUE weakness and paresthesia.   For the past 3-4 months she has noticed intermittent weakness and numbness of her left arm going from shoulder to her wrist. This has increased in frequency and occurred 3 times this week prompting her to come to ED. She denies any headache, blurred vision, facial asymmetry or numbness. Reports intermittent chest pain although points to her epigastric region and states she has been working with cardiology regarding this. She has a loop record for recurrent syncope but her syncope resolved after they removed her antihypertensives.   On arrival to ED, she was afebrile, BP up to 197/62.   CBC unremarkable. BMP with mildly elevated creatinine of 1.10  UA and UDS negative. ETOH <10.   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 She reports a remote hx of GI bleed following taking DVT prophylaxis for knee replacement surgery. Believes it was aspirin.   Neurology consulted and recommends full workup.  Review of Systems: As mentioned in the history of present illness. All other systems reviewed and are negative. Past Medical History:  Diagnosis Date   Allergy    Anal or rectal pain    sometimes   Anemia    hx of during pregnancy   Anxiety    Arthritis    Asthma    hx of   Bradycardia    " I KNOW I HAVE BRADYCARDIA ESPECIALLY WHEN I SLEEP"    Cataract 2021   bilateral eyes   Chronic back pain    Degenerative joint disease    osteo   Depression    Diabetes mellitus without  complication (HCC)    DM type II   Diverticulosis 2003   Dysrhythmia    hx of  due to eye drop and also low heart rate 40's per pt. Dr. Allyson Sabal follows   Elevated total protein    Esophageal dysmotility    Fibromyalgia    GERD (gastroesophageal reflux disease)    subsequent Nissen Fundoplication   Glaucoma    Hearing loss    Heart murmur    "was told she had a heart murmur"   Hemorrhoids    Hiatal hernia 11/08/2009   Hx of adenomatous colonic polyps 07/02/2002   Hypercalcemia    Hyperlipidemia    Hyperlipidemia    Hypertension    Implantable loop recorder present 11/28/2021   Nausea    Osteoporosis    PONV (postoperative nausea and vomiting)    Rectal bleeding    from hemorrhoids.     Sleep apnea    DOES USE CPAP    Thrombocytopenia (HCC)    Varicose veins of left lower extremity    Past Surgical History:  Procedure Laterality Date   CARDIAC CATHETERIZATION  03/14/1992   Normal cardiac cath. Normal LV function.   CARDIOVASCULAR STRESS TEST  01/22/2011   No scintigraphic evidence of inducible ischemia.   CAROTID DOPPLER  03/31/2007   Bilateral ICAs - no evidence of significant diameter reduction, dissectin, tortuosity, FMD, or any other vascular abnormality.  CATARACT EXTRACTION, BILATERAL     ESOPHAGEAL MANOMETRY N/A 11/14/2015   Procedure: ESOPHAGEAL MANOMETRY (EM);  Surgeon: Napoleon Form, MD;  Location: [facility];  Service: Endoscopy;  Laterality: N/A;   ESOPHAGEAL MANOMETRY N/A 01/18/2021   Procedure: ESOPHAGEAL MANOMETRY (EM);  Surgeon: Napoleon Form, MD;  Location: [facility];  Service: Endoscopy;  Laterality: N/A;   ESOPHAGOGASTRODUODENOSCOPY N/A 03/08/2021   Procedure: ESOPHAGOGASTRODUODENOSCOPY (EGD);  Surgeon: Corliss Skains, MD;  Location: [facility] OR;  Service: Thoracic;  Laterality: N/A;   EYE SURGERY     bilateral cataracts; bilateral stents   GASTRIC RESECTION  2009   GLAUCOMA SURGERY     JOINT REPLACEMENT     right knee Dr. Lequita Halt 06-23-18    KNEE SURGERY Bilateral    NISSEN FUNDOPLICATION  2000   with subsequent takedown in 2009   TOTAL KNEE ARTHROPLASTY Left 06/10/2017   Procedure: LEFT  TOTAL KNEE ARTHROPLASTY;  Surgeon: Ollen Gross, MD;  Location: [facility] ORS;  Service: Orthopedics;  Laterality: Left;  Adductor Block   TOTAL KNEE ARTHROPLASTY Right 06/23/2018   Procedure: RIGHT TOTAL KNEE ARTHROPLASTY;  Surgeon: Ollen Gross, MD;  Location: [facility];  Service: Orthopedics;  Laterality: Right;   TRANSTHORACIC ECHOCARDIOGRAM  12/21/2010   EF 60%, moderate LVH,    TUBAL LIGATION     XI ROBOTIC ASSISTED HIATAL HERNIA REPAIR N/A 03/08/2021   Procedure: XI ROBOTIC ASSISTED LAPAROSCOPY WITH LYSIS OF ADHESIONS;  Surgeon: Corliss Skains, MD;  Location: [facility] OR;  Service: Thoracic;  Laterality: N/A;   Social History:  reports that she has never smoked. She has never used smokeless tobacco. She reports that she does not drink alcohol and does not use drugs.  Allergies  Allergen Reactions   Adalat [Nifedipine] Other (See Comments)    Tired, Made pt "feel crazy in the head"   Hydrocodone Itching and Nausea And Vomiting   Meperidine Hcl Other (See Comments)    Demerol causes hallucinations   Naproxen Nausea And Vomiting    Family History  Problem Relation Age of Onset   Heart disease Mother    Hypertension Mother    Diabetes Mother    Diabetes Father    Kidney disease Father    Stroke Brother    Cancer Maternal Grandmother    Kidney disease Maternal Grandfather    Breast cancer Daughter    Stomach cancer Maternal Aunt    Uterine cancer Maternal Aunt    Leukemia Maternal Uncle    Prostate cancer Maternal Uncle    Colon cancer Neg Hx    Rectal cancer Neg Hx    Esophageal cancer Neg Hx     Prior to Admission medications   Medication Sig Start Date End Date Taking? Authorizing Provider  acetaminophen (TYLENOL) 500 MG tablet Take 1,000 mg by mouth 2 (two) times daily as needed for headache (back and knee pain).    [provider]  albuterol (VENTOLIN HFA) 108 (90 Base) MCG/ACT inhaler Inhale 1 puff into the lungs as needed for wheezing or shortness of breath. 01/29/22   [provider]  ascorbic acid (VITAMIN C) 500 MG tablet Take 1 tablet (500 mg total) by mouth daily. 10/23/22   Arnetha Courser, MD  brimonidine (ALPHAGAN) 0.2 % ophthalmic solution 1 drop 2 (two) times daily. 09/26/22   [provider]  cetirizine (ZYRTEC) 10 MG tablet Take 10 mg by mouth at bedtime.    [provider]  Cholecalciferol (VITAMIN D3) 250 MCG (10000 UT) capsule Take  10,000 Units by mouth daily.    [provider]  dexlansoprazole (DEXILANT) 60 MG capsule Take 1 capsule (60 mg total) by mouth daily. 01/09/23   Arnaldo Natal, NP  furosemide (LASIX) 20 MG tablet Take 1 tablet (20 mg total) by mouth once a week. 10/29/22   Tobb, Kardie, DO  hydrocortisone (ANUSOL-HC) 25 MG suppository Place 1 suppository (25 mg total) rectally at bedtime. 01/09/23   Arnaldo Natal, NP  ketoconazole (NIZORAL) 2 % cream Apply 1 Application topically daily. 05/17/23   Louann Sjogren, DPM  latanoprost (XALATAN) 0.005 % ophthalmic solution Place 1 drop into the right eye at bedtime. 06/10/23   [provider]  losartan (COZAAR) 25 MG tablet Take 0.5 tablets (12.5 mg total) by mouth daily. 04/23/23 07/22/23  Tobb, Kardie, DO  metFORMIN (GLUCOPHAGE) 500 MG tablet Take 500 mg by mouth every morning. 08/01/23   [provider]  montelukast (SINGULAIR) 10 MG tablet Take 10 mg by mouth at bedtime.    [provider]  nystatin ointment (MYCOSTATIN) Apply 1 application  topically See admin instructions. 1 application 1-2 times a day as needed for rash 11/20/16   [provider]  Polyethyl Glycol-Propyl Glycol (SYSTANE OP) Place 1 drop into both eyes daily as needed (dry/irritated eyes).    [provider]  potassium chloride (KLOR-CON) 10 MEQ tablet Take 1 tablet (10 mEq total)  by mouth once a week. 10/29/22   Tobb, Kardie, DO  rosuvastatin (CRESTOR) 40 MG tablet Take 40 mg by mouth at bedtime.    [provider]  sertraline (ZOLOFT) 100 MG tablet Take 100 mg by mouth every morning. 03/29/22   [provider]  sucralfate (CARAFATE) 1 g tablet Take 1 tablet (1 g total) by mouth 2 (two) times daily as needed. Do not take within 2 hours of any other medications. 01/09/23   Arnaldo Natal, NP  zinc sulfate 220 (50 Zn) MG capsule Take 1 capsule (220 mg total) by mouth daily. 10/23/22   Arnetha Courser, MD    Physical Exam: Vitals:   08/06/23 1700 08/06/23 1921  BP: (!) 189/81 (!) 197/62  Pulse: 61 (!) 51  Resp: 16 18  Temp: 98.6 F (37 C) 98.2 F (36.8 C)  TempSrc: Oral Oral  SpO2: 96% 99%   Constitutional: NAD, calm, comfortable, elderly female appearing younger than stated age sitting upright in bed Eyes: lids and conjunctivae normal ENMT: Mucous membranes are moist.  Neck: normal, supple Respiratory: clear to auscultation bilaterally, no wheezing, no crackles. Normal respiratory effort. No accessory muscle use.  Cardiovascular: Regular rate and rhythm, no murmurs / rubs / gallops. No extremity edema. 2+  Abdomen: no tenderness, soft Musculoskeletal: no clubbing / cyanosis. No joint deformity upper and lower extremities.  Normal muscle tone. Left LE in CAM boot.  Skin: no rashes, lesions, ulcers. No induration Neurologic: CN 2-12 grossly intact. No facial asymmetry. No pronator drift although has limited extension of right UE due to right should needing arthroplasty. Right UE able to lift against resistance. Not able to perform heel to shin due to left LE being in CAM boot. Intact finger to nose.  Psychiatric: Normal judgment and insight. Alert and oriented x 3. Normal mood.   Data Reviewed:  See hpi  Assessment and Plan: * CVA (cerebral vascular accident) (HCC) - MRI brain multiple small acute infarcts of the right cerebral  hemisphere. - CTA head and neck pending - obtain echocardiogram  - continue home rosuvastatin -neurology  recommends starting on monotherapy with aspirin  rather than with the addition of Plavix due to remote hx of GI bleed  on antiplatelet -Obtain A1c and lipids -PT/OT/SLT -Frequent neuro checks and keep on telemetry -Allow for permissive hypertension with blood pressure treatment as needed only if systolic goes above 220  Essential hypertension -elevated but allowing for permissive HTN -she only takes Losartan PRN at home due to hx of recurrent syncope from hypotension   Depression -continue sertraline  Closed nondisplaced fracture of fifth metatarsal bone of left foot -follow a fall on 07/30/2023 -remains immobilized in CAM boot and follows with Podiatry outpatient  GERD -continue PPI      Advance Care Planning:   Code Status: Full Code   Consults: neurology  Family Communication: husband at bedside  Severity of Illness: The appropriate patient status for this patient is OBSERVATION. Observation status is judged to be reasonable and necessary in order to provide the required intensity of service to ensure the patient's safety. The patient's presenting symptoms, physical exam findings, and initial radiographic and laboratory data in the context of their medical condition is felt to place them at decreased risk for further clinical deterioration. Furthermore, it is anticipated that the patient will be medically stable for discharge from the hospital within 2 midnights of admission.   Author: Anselm Jungling, DO 08/07/2023 12:35 AM  For on call review [email or web address].

## 2023-08-06 NOTE — ED Provider Notes (Signed)
EMERGENCY DEPARTMENT AT [facility] Provider Note   CSN: [id number] Arrival date & time: 08/06/23  1624     History  Chief Complaint  Patient presents with   Weakness    [name] is a 82 y.o. female.  82 year old female here today with intermittent episodes of left arm weakness that have been ongoing for months.  Patient had been scheduled to have an outpatient MRI by her PCP, but since the symptoms were a bit worse today, the PCP recommend that she go to the emergency department for an expedited workup.  No trauma, no fever no chills.   Weakness      Home Medications Prior to Admission medications   Medication Sig Start Date End Date Taking? Authorizing Provider  acetaminophen (TYLENOL) 500 MG tablet Take 1,000 mg by mouth 2 (two) times daily as needed for headache (back and knee pain).    [provider]  albuterol (VENTOLIN HFA) 108 (90 Base) MCG/ACT inhaler Inhale 1 puff into the lungs as needed for wheezing or shortness of breath. 01/29/22   [provider]  ascorbic acid (VITAMIN C) 500 MG tablet Take 1 tablet (500 mg total) by mouth daily. 10/23/22   Arnetha Courser, MD  brimonidine (ALPHAGAN) 0.2 % ophthalmic solution 1 drop 2 (two) times daily. 09/26/22   [provider]  cetirizine (ZYRTEC) 10 MG tablet Take 10 mg by mouth at bedtime.    [provider]  Cholecalciferol (VITAMIN D3) 250 MCG (10000 UT) capsule Take 10,000 Units by mouth daily.    [provider]  dexlansoprazole (DEXILANT) 60 MG capsule Take 1 capsule (60 mg total) by mouth daily. 01/09/23   Arnaldo Natal, NP  furosemide (LASIX) 20 MG tablet Take 1 tablet (20 mg total) by mouth once a week. 10/29/22   Tobb, Kardie, DO  hydrocortisone (ANUSOL-HC) 25 MG suppository Place 1 suppository (25 mg total) rectally at bedtime. 01/09/23   Arnaldo Natal, NP  ketoconazole (NIZORAL) 2 % cream Apply 1 Application topically daily.  05/17/23   Louann Sjogren, DPM  latanoprost (XALATAN) 0.005 % ophthalmic solution Place 1 drop into the right eye at bedtime. 06/10/23   [provider]  losartan (COZAAR) 25 MG tablet Take 0.5 tablets (12.5 mg total) by mouth daily. 04/23/23 07/22/23  Tobb, Kardie, DO  metFORMIN (GLUCOPHAGE) 500 MG tablet Take 500 mg by mouth every morning. 08/01/23   [provider]  montelukast (SINGULAIR) 10 MG tablet Take 10 mg by mouth at bedtime.    [provider]  nystatin ointment (MYCOSTATIN) Apply 1 application  topically See admin instructions. 1 application 1-2 times a day as needed for rash 11/20/16   [provider]  Polyethyl Glycol-Propyl Glycol (SYSTANE OP) Place 1 drop into both eyes daily as needed (dry/irritated eyes).    [provider]  potassium chloride (KLOR-CON) 10 MEQ tablet Take 1 tablet (10 mEq total) by mouth once a week. 10/29/22   Tobb, Kardie, DO  rosuvastatin (CRESTOR) 40 MG tablet Take 40 mg by mouth at bedtime.    [provider]  sertraline (ZOLOFT) 100 MG tablet Take 100 mg by mouth every morning. 03/29/22   [provider]  sucralfate (CARAFATE) 1 g tablet Take 1 tablet (1 g total) by mouth 2 (two) times daily as needed. Do not take within 2 hours of any other medications. 01/09/23   Arnaldo Natal, NP  zinc sulfate 220 (50 Zn) MG capsule Take 1 capsule (  220 mg total) by mouth daily. 10/23/22   Arnetha Courser, MD      Allergies    Adalat [nifedipine], Hydrocodone, Meperidine hcl, and Naproxen    Review of Systems   Review of Systems  Neurological:  Positive for weakness.    Physical Exam Updated Vital Signs BP (!) 197/62 (BP Location: Right Arm)   Pulse (!) 51   Temp 98.2 F (36.8 C) (Oral)   Resp 18   SpO2 99%  Physical Exam Vitals reviewed.  Constitutional:      Appearance: Normal appearance.  Cardiovascular:     Rate and Rhythm: Normal rate.  Pulmonary:     Effort: Pulmonary effort is normal.   Musculoskeletal:        General: No swelling, tenderness, deformity or signs of injury. Normal range of motion.  Skin:    General: Skin is warm and dry.  Neurological:     General: No focal deficit present.     Mental Status: She is alert.     Comments: No pronator drift, 5 out of 5 grip strength in the left arm, 5 out of 5 arm extension, arm flexion.  5/5 abduction and adduction.  Normal sensation.     ED Results / Procedures / Treatments   Labs (all labs ordered are listed, but only abnormal results are displayed) Labs Reviewed  COMPREHENSIVE METABOLIC PANEL - Abnormal; Notable for the following components:      Result Value   Glucose, Bld 122 (*)    GFR, Estimated 58 (*)    All other components within normal limits  I-STAT CHEM 8, ED - Abnormal; Notable for the following components:   Creatinine, Ser 1.10 (*)    Glucose, Bld 127 (*)    All other components within normal limits  ETHANOL  PROTIME-INR  APTT  CBC  DIFFERENTIAL  RAPID URINE DRUG SCREEN, HOSP PERFORMED  URINALYSIS, ROUTINE W REFLEX MICROSCOPIC    EKG None  Radiology CUP PACEART REMOTE DEVICE CHECK  Result Date: 08/05/2023 ILR summary report received. Battery status OK. Normal device function. No new symptom, tachy, brady, or pause episodes. No new AF episodes. Monthly summary reports and ROV/PRN - CS, CVRS   Procedures Procedures    Medications Ordered in ED Medications - No data to display  ED Course/ Medical Decision Making/ A&P                                 Medical Decision Making 82 year old female here today with intermittent episodes of arm weakness.  Plan-I did not appreciate any focal deficits on my exam.  Patient had imaging ordered at triage.  Labs were ordered at triage.    Reassessment-9 PM.  Patient's MRI showing multiple small acute infarcts.  Have placed a neurology consultation.  Spoke with neurology.  They recommend admission, CTA and CTA of the neck.  Concern is for  cervical arterial showering of clots.  Risk Decision regarding hospitalization.           Final Clinical Impression(s) / ED Diagnoses Final diagnoses:  None    Rx / DC Orders ED Discharge Orders     None         Arletha Pili, DO 08/09/23 1629

## 2023-08-06 NOTE — Telephone Encounter (Signed)
  Pt's daughter calling, she said, pt is having another episode of sudden weakness in her left arm today, she said, it was so numb pt can't move it. She tried to convince pt to bring to ED but pt refused. Pt said, that this pain might be from her ILR, since this episode started when she got her ILR. She would like to know if that can be from the loop recorder

## 2023-08-06 NOTE — Consult Note (Signed)
NEUROLOGY CONSULT NOTE   Date of service: August 06, 2023 Patient Name: [name] MRN:  [id number] DOB:  1941/10/10 Chief Complaint: "intermittent LUE and LLE weakness and numbness" Requesting Provider: No att. providers found  History of Present Illness  [name] is a 82 y.o. female with hx of HTN, DM2, HLD, who presents with intermittent LUE weakness and numbness. Episodes are occurring randomly and have been becoming longer and more frequent. She was having about one every couple weeks and now had 2 in the last 2 days. PCP was unable to get STAT outpatient MRI and recommended that she come to the ED for further imaging and workup. 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  She also reports a fall a couple weeks ago when she fractured her L foot and has been in a brace.  No prior hx of stroke, endorses family hx of strokes. Does not smoke or use any recreational substance. Endorses hx of DM2, HTN, HLD. Does no drink EtOH.  Of note, she endorses hx of bleeding from her rectum with aspirin. This was within the last couple of years. She reports that the bleeding was profuse almost like she was having a period.  LKW: several weeks ago Modified rankin score: 1-No significant post stroke disability and can perform usual duties with stroke symptoms IV Thrombolysis: not offered, too mild to treat and outside window. EVT: not offered, too mild to treat.  NIHSS components Score: Comment  1a Level of Conscious 0[x]  1[]  2[]  3[]      1b LOC Questions 0[x]  1[]  2[]       1c LOC Commands 0[x]  1[]  2[]       2 Best Gaze 0[x]  1[]  2[]       3 Visual 0[x]  1[]  2[]  3[]      4 Facial Palsy 0[x]  1[]  2[]  3[]      5a Motor Arm - left 0[]  1[x]  2[]  3[]  4[]  UN[]    5b Motor Arm - Right 0[x]  1[]  2[]  3[]  4[]  UN[]    6a Motor Leg - Left 0[]  1[x]  2[]  3[]  4[]  UN[]    6b Motor Leg - Right 0[x]  1[]  2[]  3[]  4[]  UN[]    7 Limb Ataxia 0[x]  1[]  2[]  3[]  UN[]     8 Sensory 0[]  1[x]  2[]  UN[]      9  Best Language 0[x]  1[]  2[]  3[]      10 Dysarthria 0[x]  1[]  2[]  UN[]      11 Extinct. and Inattention 0[x]  1[]  2[]       TOTAL: 3       ROS  Comprehensive ROS performed and pertinent positives documented in HPI  Past History   Past Medical History:  Diagnosis Date   Allergy    Anal or rectal pain    sometimes   Anemia    hx of during pregnancy   Anxiety    Arthritis    Asthma    hx of   Bradycardia    " I KNOW I HAVE BRADYCARDIA ESPECIALLY WHEN I SLEEP"    Cataract 2021   bilateral eyes   Chronic back pain    Degenerative joint disease    osteo   Depression    Diabetes mellitus without complication (HCC)    DM type II   Diverticulosis 2003   Dysrhythmia    hx of  due to eye drop and also low heart rate 40's per pt. Dr. Allyson Sabal follows   Elevated total protein    Esophageal dysmotility    Fibromyalgia  GERD (gastroesophageal reflux disease)    subsequent Nissen Fundoplication   Glaucoma    Hearing loss    Heart murmur    "was told she had a heart murmur"   Hemorrhoids    Hiatal hernia 11/08/2009   Hx of adenomatous colonic polyps 07/02/2002   Hypercalcemia    Hyperlipidemia    Hyperlipidemia    Hypertension    Implantable loop recorder present 11/28/2021   Nausea    Osteoporosis    PONV (postoperative nausea and vomiting)    Rectal bleeding    from hemorrhoids.     Sleep apnea    DOES USE CPAP    Thrombocytopenia (HCC)    Varicose veins of left lower extremity     Past Surgical History:  Procedure Laterality Date   CARDIAC CATHETERIZATION  03/14/1992   Normal cardiac cath. Normal LV function.   CARDIOVASCULAR STRESS TEST  01/22/2011   No scintigraphic evidence of inducible ischemia.   CAROTID DOPPLER  03/31/2007   Bilateral ICAs - no evidence of significant diameter reduction, dissectin, tortuosity, FMD, or any other vascular abnormality.   CATARACT EXTRACTION, BILATERAL     ESOPHAGEAL MANOMETRY N/A 11/14/2015   Procedure: ESOPHAGEAL MANOMETRY (EM);   Surgeon: Napoleon Form, MD;  Location: [facility];  Service: Endoscopy;  Laterality: N/A;   ESOPHAGEAL MANOMETRY N/A 01/18/2021   Procedure: ESOPHAGEAL MANOMETRY (EM);  Surgeon: Napoleon Form, MD;  Location: [facility];  Service: Endoscopy;  Laterality: N/A;   ESOPHAGOGASTRODUODENOSCOPY N/A 03/08/2021   Procedure: ESOPHAGOGASTRODUODENOSCOPY (EGD);  Surgeon: Corliss Skains, MD;  Location: [facility] OR;  Service: Thoracic;  Laterality: N/A;   EYE SURGERY     bilateral cataracts; bilateral stents   GASTRIC RESECTION  2009   GLAUCOMA SURGERY     JOINT REPLACEMENT     right knee Dr. Lequita Halt 06-23-18   KNEE SURGERY Bilateral    NISSEN FUNDOPLICATION  2000   with subsequent takedown in 2009   TOTAL KNEE ARTHROPLASTY Left 06/10/2017   Procedure: LEFT  TOTAL KNEE ARTHROPLASTY;  Surgeon: Ollen Gross, MD;  Location: [facility];  Service: Orthopedics;  Laterality: Left;  Adductor Block   TOTAL KNEE ARTHROPLASTY Right 06/23/2018   Procedure: RIGHT TOTAL KNEE ARTHROPLASTY;  Surgeon: Ollen Gross, MD;  Location: [facility];  Service: Orthopedics;  Laterality: Right;   TRANSTHORACIC ECHOCARDIOGRAM  12/21/2010   EF 60%, moderate LVH,    TUBAL LIGATION     XI ROBOTIC ASSISTED HIATAL HERNIA REPAIR N/A 03/08/2021   Procedure: XI ROBOTIC ASSISTED LAPAROSCOPY WITH LYSIS OF ADHESIONS;  Surgeon: Corliss Skains, MD;  Location: [facility] OR;  Service: Thoracic;  Laterality: N/A;    Family History: Family History  Problem Relation Age of Onset   Heart disease Mother    Hypertension Mother    Diabetes Mother    Diabetes Father    Kidney disease Father    Stroke Brother    Cancer Maternal Grandmother    Kidney disease Maternal Grandfather    Breast cancer Daughter    Stomach cancer Maternal Aunt    Uterine cancer Maternal Aunt    Leukemia Maternal Uncle    Prostate cancer Maternal Uncle    Colon cancer Neg Hx    Rectal cancer Neg Hx    Esophageal cancer Neg Hx     Social History  reports that she  has never smoked. She has never used smokeless tobacco. She reports that she does not drink alcohol and does not use drugs.  Allergies  Allergen Reactions   Adalat [Nifedipine] Other (See Comments)    Tired, Made pt "feel crazy in the head"   Hydrocodone Itching and Nausea And Vomiting   Meperidine Hcl Other (See Comments)    Demerol causes hallucinations   Naproxen Nausea And Vomiting    Medications   Current Facility-Administered Medications:    [START ON 08/07/2023] aspirin EC tablet 81 mg, 81 mg, Oral, Daily, Erick Blinks, MD   clopidogrel (PLAVIX) tablet 75 mg, 75 mg, Oral, Daily, Erick Blinks, MD  Current Outpatient Medications:    acetaminophen (TYLENOL) 500 MG tablet, Take 1,000 mg by mouth 2 (two) times daily as needed for headache (back and knee pain)., Disp: , Rfl:    albuterol (VENTOLIN HFA) 108 (90 Base) MCG/ACT inhaler, Inhale 1 puff into the lungs as needed for wheezing or shortness of breath., Disp: , Rfl:    ascorbic acid (VITAMIN C) 500 MG tablet, Take 1 tablet (500 mg total) by mouth daily., Disp: 90 tablet, Rfl: 0   brimonidine (ALPHAGAN) 0.2 % ophthalmic solution, 1 drop 2 (two) times daily., Disp: , Rfl:    cetirizine (ZYRTEC) 10 MG tablet, Take 10 mg by mouth at bedtime., Disp: , Rfl:    Cholecalciferol (VITAMIN D3) 250 MCG (10000 UT) capsule, Take 10,000 Units by mouth daily., Disp: , Rfl:    dexlansoprazole (DEXILANT) 60 MG capsule, Take 1 capsule (60 mg total) by mouth daily., Disp: 90 capsule, Rfl: 3   furosemide (LASIX) 20 MG tablet, Take 1 tablet (20 mg total) by mouth once a week., Disp: 13 tablet, Rfl: 3   hydrocortisone (ANUSOL-HC) 25 MG suppository, Place 1 suppository (25 mg total) rectally at bedtime., Disp: 5 suppository, Rfl: 1   ketoconazole (NIZORAL) 2 % cream, Apply 1 Application topically daily., Disp: 60 g, Rfl: 2   latanoprost (XALATAN) 0.005 % ophthalmic solution, Place 1 drop into the right eye at bedtime., Disp: , Rfl:     losartan (COZAAR) 25 MG tablet, Take 0.5 tablets (12.5 mg total) by mouth daily., Disp: 45 tablet, Rfl: 3   metFORMIN (GLUCOPHAGE) 500 MG tablet, Take 500 mg by mouth every morning., Disp: , Rfl:    montelukast (SINGULAIR) 10 MG tablet, Take 10 mg by mouth at bedtime., Disp: , Rfl:    nystatin ointment (MYCOSTATIN), Apply 1 application  topically See admin instructions. 1 application 1-2 times a day as needed for rash, Disp: , Rfl: 0   Polyethyl Glycol-Propyl Glycol (SYSTANE OP), Place 1 drop into both eyes daily as needed (dry/irritated eyes)., Disp: , Rfl:    potassium chloride (KLOR-CON) 10 MEQ tablet, Take 1 tablet (10 mEq total) by mouth once a week., Disp: 13 tablet, Rfl: 3   rosuvastatin (CRESTOR) 40 MG tablet, Take 40 mg by mouth at bedtime., Disp: , Rfl:    sertraline (ZOLOFT) 100 MG tablet, Take 100 mg by mouth every morning., Disp: , Rfl:    sucralfate (CARAFATE) 1 g tablet, Take 1 tablet (1 g total) by mouth 2 (two) times daily as needed. Do not take within 2 hours of any other medications., Disp: 60 tablet, Rfl: 3   zinc sulfate 220 (50 Zn) MG capsule, Take 1 capsule (220 mg total) by mouth daily., Disp: 90 capsule, Rfl: 0  Vitals   Vitals:   08/06/23 1700 08/06/23 1921  BP: (!) 189/81 (!) 197/62  Pulse: 61 (!) 51  Resp: 16 18  Temp: 98.6 F (37 C) 98.2 F (36.8 C)  TempSrc: Oral Oral  SpO2: 96%  99%    There is no height or weight on file to calculate BMI.  Physical Exam   Constitutional: Appears well-developed and well-nourished.  Psych: Affect appropriate to situation.  Eyes: No scleral injection.  HENT: No OP obstruction.  Head: Normocephalic.  Cardiovascular: Normal rate and regular rhythm.  Respiratory: Effort normal, non-labored breathing.  GI: Soft.  No distension. There is no tenderness.  Skin: WDI.   Neurologic Examination  Mental status/Cognition: Alert, oriented to self, place, month and year, good attention.  Speech/language: Fluent, comprehension  intact, object naming intact, repetition intact.  Cranial nerves:   CN II Pupils equal and reactive to light, no VF deficits    CN III,IV,VI EOM intact, no gaze preference or deviation, no nystagmus    CN V Numbness to light touch on the left.   CN VII no asymmetry, no nasolabial fold flattening    CN VIII normal hearing to speech    CN IX & X normal palatal elevation, no uvular deviation    CN XI 5/5 head turn and 5/5 shoulder shrug bilaterally    CN XII midline tongue protrusion    Motor:  Muscle bulk: intact BL, tone intact, pronator drift noted in LUE Mvmt Root Nerve  Muscle Right Left Comments  SA C5/6 Ax Deltoid 5 4   EF C5/6 Mc Biceps 5 4   EE C6/7/8 Rad Triceps 5 4   WF C6/7 Med FCR     WE C7/8 PIN ECU     F Ab C8/T1 U ADM/FDI 4+ 4   HF L1/2/3 Fem Illopsoas 4+ 4   KE L2/3/4 Fem Quad 5 4   DF L4/5 D Peron Tib Ant 5  Left foot in a brace.  PF S1/2 Tibial Grc/Sol 5     Sensation:  Light touch Decreased in the LUE and L face to touch. Intact in BL lower ext to touch   Pin prick    Temperature    Vibration   Proprioception    Coordination/Complex Motor:  - Finger to Nose intact BL - Heel to shin unable to do due to L leg weakness. - Rapid alternating movement slowed on the left - Gait: deferred.   Labs   CBC:  Recent Labs  Lab 08/06/23 1726 08/06/23 1738  WBC 5.1  --   NEUTROABS 2.3  --   HGB 14.1 15.0  HCT 42.9 44.0  MCV 96.2  --   PLT 179  --     Basic Metabolic Panel:  Lab Results  Component Value Date   NA 140 08/06/2023   K 4.3 08/06/2023   CO2 26 08/06/2023   GLUCOSE 127 (H) 08/06/2023   BUN 17 08/06/2023   CREATININE 1.10 (H) 08/06/2023   CALCIUM 9.3 08/06/2023   GFRNONAA 58 (L) 08/06/2023   GFRAA >60 07/04/2018   Lipid Panel:  Lab Results  Component Value Date   LDLCALC 115 (H) 10/26/2020   HgbA1c:  Lab Results  Component Value Date   HGBA1C 6.8 (H) 10/22/2022   Urine Drug Screen:     Component Value Date/Time   LABOPIA NONE  DETECTED 08/06/2023 1802   COCAINSCRNUR NONE DETECTED 08/06/2023 1802   LABBENZ NONE DETECTED 08/06/2023 1802   AMPHETMU NONE DETECTED 08/06/2023 1802   THCU NONE DETECTED 08/06/2023 1802   LABBARB NONE DETECTED 08/06/2023 1802    Alcohol Level     Component Value Date/Time   ETH <10 08/06/2023 1726   INR  Lab Results  Component Value Date  INR 1.0 08/06/2023   APTT  Lab Results  Component Value Date   APTT 24 08/06/2023   AED levels: No results found for: "PHENYTOIN", "ZONISAMIDE", "LAMOTRIGINE", "LEVETIRACETA"  CT angio Head and Neck with contrast: pending  MRI Brain(Personally reviewed): 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  Impression   [name] is a 82 y.o. female with hx of HTN, DM2, HLD, who presents with intermittent LUE weakness and numbness. Episodes are occurring randomly and have been becoming longer and more frequent. 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 Recommendations  - Frequent Neuro checks per stroke unit protocol - Recommend Vascular imaging with CTA head and neck - Recommend obtaining TTE - Recommend obtaining Lipid panel with LDL - Please start statin if LDL > 70 - Recommend HbA1c to evaluate for diabetes and how well it is controlled. - Antithrombotic - Aspirin 81mg  EC tablet. Hold off on second antiplatelet due to reproted hx of significant BRBPR from antiplatelet agents in the past. - Recommend DVT ppx - SBP goal - permissive hypertension first 24 h < 220/110. Held home meds.  - Recommend Telemetry monitoring for arrythmia - Recommend bedside swallow screen prior to PO intake. - Stroke education booklet - Recommend PT/OT/SLP consult  ______________________________________________________________________  Plan discussed with patient and with her husband at bedside and with Dr. Andria Meuse with the ED team.  Signed,  Erick Blinks

## 2023-08-06 NOTE — ED Provider Triage Note (Signed)
Emergency Medicine Provider Triage Evaluation Note  [name] , a 82 y.o. female  was evaluated in triage.  Pt complains of left arm weakness. Patient fell last Tuesday, +LOC, woke up and noted foot pain. Patient was scheduled to see her podiatrist the following day anyway for nail trim and mentioned her fall and foot pain, was found to have a broken foot and placed in a boot.  Patient followed up with her cardiologist yesterday and mentioned having some left arm weakness off and on, cardiology recommended patient have an MRI for possible TIA.  Patient woke up today and when she was sitting on her bed she noticed diminished strength and sensation in her left arm.  She is brought in by her daughter.  She continues to have some weakness although not as noticeable today in the left arm.  No speech changes or other asymmetry or gait changes noted.  Review of Systems  Positive:  Negative:   Physical Exam  BP (!) 189/81 (BP Location: Right Arm)   Pulse 61   Temp 98.6 F (37 C) (Oral)   Resp 16   SpO2 96%  Gen:   Awake, no distress   Resp:  Normal effort  MSK:   Slight left arm weakness. Other:    Medical Decision Making  Medically screening exam initiated at 5:10 PM.  Appropriate orders placed.  [name] was informed that the remainder of the evaluation will be completed by another provider, this initial triage assessment does not replace that evaluation, and the importance of remaining in the ED until their evaluation is complete.     Cheryl Fend, PA-C 08/06/23 1711

## 2023-08-06 NOTE — ED Triage Notes (Signed)
Patient with intermittent LUE weakness, ongoing for the last few months. Family states the episode of weakness has lasted longer than normal today. She already had outpatient MRI planned but she called her PCP who advised her to come to the ED to expedite the process.

## 2023-08-06 NOTE — Telephone Encounter (Signed)
Spoke to patient who reported that she had left sided weakness lasting about 45 min. The numbness was from her shoulder down to her wrist. Stated she has had this before always on the left side but it does go away. She deny headache, blurred vision, dizziness or chest pain. BP 134/72 HR 58. Advised patient and her daughter per Dr Servando Salina that she should be seen in the ED. They both verbalized understanding and agree.

## 2023-08-07 ENCOUNTER — Observation Stay (HOSPITAL_BASED_OUTPATIENT_CLINIC_OR_DEPARTMENT_OTHER): Payer: Medicare Other

## 2023-08-07 ENCOUNTER — Observation Stay (HOSPITAL_COMMUNITY): Payer: Medicare Other

## 2023-08-07 ENCOUNTER — Encounter (HOSPITAL_COMMUNITY): Payer: Self-pay | Admitting: Family Medicine

## 2023-08-07 DIAGNOSIS — I6503 Occlusion and stenosis of bilateral vertebral arteries: Secondary | ICD-10-CM | POA: Diagnosis not present

## 2023-08-07 DIAGNOSIS — Z6832 Body mass index (BMI) 32.0-32.9, adult: Secondary | ICD-10-CM | POA: Diagnosis not present

## 2023-08-07 DIAGNOSIS — K219 Gastro-esophageal reflux disease without esophagitis: Secondary | ICD-10-CM | POA: Diagnosis present

## 2023-08-07 DIAGNOSIS — M797 Fibromyalgia: Secondary | ICD-10-CM | POA: Diagnosis present

## 2023-08-07 DIAGNOSIS — I63511 Cerebral infarction due to unspecified occlusion or stenosis of right middle cerebral artery: Secondary | ICD-10-CM | POA: Diagnosis not present

## 2023-08-07 DIAGNOSIS — Z8249 Family history of ischemic heart disease and other diseases of the circulatory system: Secondary | ICD-10-CM | POA: Diagnosis not present

## 2023-08-07 DIAGNOSIS — I63421 Cerebral infarction due to embolism of right anterior cerebral artery: Secondary | ICD-10-CM | POA: Diagnosis not present

## 2023-08-07 DIAGNOSIS — I6389 Other cerebral infarction: Secondary | ICD-10-CM | POA: Diagnosis not present

## 2023-08-07 DIAGNOSIS — Z8049 Family history of malignant neoplasm of other genital organs: Secondary | ICD-10-CM | POA: Diagnosis not present

## 2023-08-07 DIAGNOSIS — I6521 Occlusion and stenosis of right carotid artery: Secondary | ICD-10-CM | POA: Diagnosis present

## 2023-08-07 DIAGNOSIS — Z823 Family history of stroke: Secondary | ICD-10-CM | POA: Diagnosis not present

## 2023-08-07 DIAGNOSIS — R29703 NIHSS score 3: Secondary | ICD-10-CM | POA: Diagnosis present

## 2023-08-07 DIAGNOSIS — F320 Major depressive disorder, single episode, mild: Secondary | ICD-10-CM | POA: Diagnosis not present

## 2023-08-07 DIAGNOSIS — Z7984 Long term (current) use of oral hypoglycemic drugs: Secondary | ICD-10-CM | POA: Diagnosis not present

## 2023-08-07 DIAGNOSIS — Z803 Family history of malignant neoplasm of breast: Secondary | ICD-10-CM | POA: Diagnosis not present

## 2023-08-07 DIAGNOSIS — G8194 Hemiplegia, unspecified affecting left nondominant side: Secondary | ICD-10-CM | POA: Diagnosis present

## 2023-08-07 DIAGNOSIS — I6522 Occlusion and stenosis of left carotid artery: Secondary | ICD-10-CM | POA: Diagnosis not present

## 2023-08-07 DIAGNOSIS — I639 Cerebral infarction, unspecified: Secondary | ICD-10-CM | POA: Diagnosis present

## 2023-08-07 DIAGNOSIS — Z96653 Presence of artificial knee joint, bilateral: Secondary | ICD-10-CM | POA: Diagnosis present

## 2023-08-07 DIAGNOSIS — Z806 Family history of leukemia: Secondary | ICD-10-CM | POA: Diagnosis not present

## 2023-08-07 DIAGNOSIS — I10 Essential (primary) hypertension: Secondary | ICD-10-CM | POA: Diagnosis present

## 2023-08-07 DIAGNOSIS — S92355A Nondisplaced fracture of fifth metatarsal bone, left foot, initial encounter for closed fracture: Secondary | ICD-10-CM

## 2023-08-07 DIAGNOSIS — S92355D Nondisplaced fracture of fifth metatarsal bone, left foot, subsequent encounter for fracture with routine healing: Secondary | ICD-10-CM | POA: Diagnosis not present

## 2023-08-07 DIAGNOSIS — I6300 Cerebral infarction due to thrombosis of unspecified precerebral artery: Secondary | ICD-10-CM | POA: Diagnosis not present

## 2023-08-07 DIAGNOSIS — G4730 Sleep apnea, unspecified: Secondary | ICD-10-CM | POA: Diagnosis not present

## 2023-08-07 DIAGNOSIS — I129 Hypertensive chronic kidney disease with stage 1 through stage 4 chronic kidney disease, or unspecified chronic kidney disease: Secondary | ICD-10-CM | POA: Diagnosis not present

## 2023-08-07 DIAGNOSIS — F32A Depression, unspecified: Secondary | ICD-10-CM | POA: Diagnosis present

## 2023-08-07 DIAGNOSIS — R531 Weakness: Secondary | ICD-10-CM | POA: Diagnosis present

## 2023-08-07 DIAGNOSIS — I6523 Occlusion and stenosis of bilateral carotid arteries: Secondary | ICD-10-CM | POA: Diagnosis not present

## 2023-08-07 DIAGNOSIS — I6359 Cerebral infarction due to unspecified occlusion or stenosis of other cerebral artery: Secondary | ICD-10-CM | POA: Diagnosis present

## 2023-08-07 DIAGNOSIS — H9192 Unspecified hearing loss, left ear: Secondary | ICD-10-CM | POA: Diagnosis present

## 2023-08-07 DIAGNOSIS — Z841 Family history of disorders of kidney and ureter: Secondary | ICD-10-CM | POA: Diagnosis not present

## 2023-08-07 DIAGNOSIS — N1831 Chronic kidney disease, stage 3a: Secondary | ICD-10-CM | POA: Diagnosis not present

## 2023-08-07 DIAGNOSIS — I671 Cerebral aneurysm, nonruptured: Secondary | ICD-10-CM | POA: Diagnosis present

## 2023-08-07 DIAGNOSIS — J45909 Unspecified asthma, uncomplicated: Secondary | ICD-10-CM | POA: Diagnosis present

## 2023-08-07 DIAGNOSIS — M810 Age-related osteoporosis without current pathological fracture: Secondary | ICD-10-CM | POA: Diagnosis present

## 2023-08-07 DIAGNOSIS — E7800 Pure hypercholesterolemia, unspecified: Secondary | ICD-10-CM | POA: Diagnosis present

## 2023-08-07 DIAGNOSIS — Z79899 Other long term (current) drug therapy: Secondary | ICD-10-CM | POA: Diagnosis not present

## 2023-08-07 DIAGNOSIS — E119 Type 2 diabetes mellitus without complications: Secondary | ICD-10-CM | POA: Diagnosis present

## 2023-08-07 LAB — ECHOCARDIOGRAM COMPLETE
Area-P 1/2: 2.56 cm2
Calc EF: 60.6 %
Height: 71 in
S' Lateral: 2.7 cm
Single Plane A2C EF: 59.3 %
Single Plane A4C EF: 60.7 %
Weight: 3440 [oz_av]

## 2023-08-07 LAB — LIPID PANEL
Cholesterol: 192 mg/dL (ref 0–200)
HDL: 61 mg/dL (ref >40)
LDL Cholesterol: 112 mg/dL — ABNORMAL HIGH (ref 0–99)
Total CHOL/HDL Ratio: 3.1 {ratio}
Triglycerides: 96 mg/dL (ref <150)
VLDL: 19 mg/dL (ref 0–40)

## 2023-08-07 LAB — HEMOGLOBIN A1C
Hgb A1c MFr Bld: 6.8 % — ABNORMAL HIGH (ref 4.8–5.6)
Mean Plasma Glucose: 148.46 mg/dL

## 2023-08-07 MED ORDER — IOHEXOL 350 MG/ML SOLN
75.0000 mL | Freq: Once | INTRAVENOUS | Status: AC | PRN
  Administered 2023-08-07: 60 mL via INTRAVENOUS

## 2023-08-07 MED ORDER — PANTOPRAZOLE SODIUM 40 MG PO TBEC
40.0000 mg | DELAYED_RELEASE_TABLET | Freq: Every day | ORAL | Status: DC
  Administered 2023-08-07 – 2023-08-12 (×6): 40 mg via ORAL
  Filled 2023-08-07 (×6): qty 1

## 2023-08-07 MED ORDER — ZINC SULFATE 220 (50 ZN) MG PO CAPS
220.0000 mg | ORAL_CAPSULE | Freq: Every day | ORAL | Status: DC
  Administered 2023-08-07 – 2023-08-12 (×6): 220 mg via ORAL
  Filled 2023-08-07 (×6): qty 1

## 2023-08-07 MED ORDER — LATANOPROST 0.005 % OP SOLN
1.0000 [drp] | Freq: Every day | OPHTHALMIC | Status: DC
  Administered 2023-08-07 – 2023-08-11 (×5): 1 [drp] via OPHTHALMIC
  Filled 2023-08-07 (×2): qty 2.5

## 2023-08-07 MED ORDER — VITAMIN C 500 MG PO TABS
500.0000 mg | ORAL_TABLET | Freq: Every day | ORAL | Status: DC
  Administered 2023-08-07 – 2023-08-12 (×6): 500 mg via ORAL
  Filled 2023-08-07 (×6): qty 1

## 2023-08-07 MED ORDER — ORAL CARE MOUTH RINSE: 15.0000 mL | OROMUCOSAL | Status: DC | PRN

## 2023-08-07 MED ORDER — LORATADINE 10 MG PO TABS
10.0000 mg | ORAL_TABLET | Freq: Every day | ORAL | Status: DC
  Administered 2023-08-07 – 2023-08-12 (×6): 10 mg via ORAL
  Filled 2023-08-07 (×6): qty 1

## 2023-08-07 MED ORDER — MONTELUKAST SODIUM 10 MG PO TABS
10.0000 mg | ORAL_TABLET | Freq: Every day | ORAL | Status: DC
  Administered 2023-08-07 – 2023-08-11 (×5): 10 mg via ORAL
  Filled 2023-08-07 (×5): qty 1

## 2023-08-07 MED ORDER — SUCRALFATE 1 G PO TABS
1.0000 g | ORAL_TABLET | Freq: Two times a day (BID) | ORAL | Status: DC | PRN
  Administered 2023-08-09: 1 g via ORAL
  Filled 2023-08-07: qty 1

## 2023-08-07 MED ORDER — SERTRALINE HCL 100 MG PO TABS
100.0000 mg | ORAL_TABLET | Freq: Every morning | ORAL | Status: DC
  Administered 2023-08-07 – 2023-08-12 (×6): 100 mg via ORAL
  Filled 2023-08-07 (×6): qty 1

## 2023-08-07 MED ORDER — ROSUVASTATIN CALCIUM 20 MG PO TABS
40.0000 mg | ORAL_TABLET | Freq: Every day | ORAL | Status: DC
  Administered 2023-08-07: 40 mg via ORAL
  Filled 2023-08-07: qty 2

## 2023-08-07 MED ORDER — BRIMONIDINE TARTRATE 0.2 % OP SOLN
1.0000 [drp] | Freq: Two times a day (BID) | OPHTHALMIC | Status: DC
  Administered 2023-08-07 – 2023-08-12 (×11): 1 [drp] via OPHTHALMIC
  Filled 2023-08-07 (×2): qty 5

## 2023-08-07 MED ORDER — HYDRALAZINE HCL 20 MG/ML IJ SOLN: 5.0000 mg | INTRAMUSCULAR | Status: DC | PRN

## 2023-08-07 MED ORDER — VITAMIN D 25 MCG (1000 UNIT) PO TABS
10000.0000 [IU] | ORAL_TABLET | Freq: Every day | ORAL | Status: DC
  Administered 2023-08-07 – 2023-08-12 (×6): 10000 [IU] via ORAL
  Filled 2023-08-07 (×6): qty 10

## 2023-08-07 NOTE — Assessment & Plan Note (Signed)
-  follow a fall on 07/30/2023 -remains immobilized in CAM boot and follows with Podiatry outpatient

## 2023-08-07 NOTE — Assessment & Plan Note (Signed)
-   MRI brain multiple small acute infarcts of the right cerebral hemisphere. - CTA head and neck pending - obtain echocardiogram  - continue home rosuvastatin -neurology recommends starting on monotherapy with aspirin  rather than with the addition of Plavix due to remote hx of GI bleed  on antiplatelet -Obtain A1c and lipids -PT/OT/SLT -Frequent neuro checks and keep on telemetry -Allow for permissive hypertension with blood pressure treatment as needed only if systolic goes above 220

## 2023-08-07 NOTE — Assessment & Plan Note (Signed)
continue PPI

## 2023-08-07 NOTE — Evaluation (Signed)
Physical Therapy Evaluation Patient Details Name: [name] MRN: [id number] DOB: [birth year] Today's Date: 08/07/2023  History of Present Illness  [name] is a 82 y.o. female admitted 10/22 who presents with intermittent LUE weakness and numbness. Episodes are occurring randomly and have been becoming longer and more frequent. 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 Pt with recent left ankle fracture with boot for ambulation.Supposed to have right TSA on 09/06/23.  PMH: HTN, DM2, HLD  Clinical Impression  Pt admitted with above diagnosis. Pt limited by pain left ankle and didn't want to stand up today therefore performed sitting activities and exercises. Pt and husband feel that they can manage at home and some of pts symptoms have resolved. Will follow acutely.  Pt currently with functional limitations due to the deficits listed below (see PT Problem List). Pt will benefit from acute skilled PT to increase their independence and safety with mobility to allow discharge.           If plan is discharge home, recommend the following: A little help with walking and/or transfers;A little help with bathing/dressing/bathroom;Assistance with cooking/housework;Help with stairs or ramp for entrance;Assist for transportation   Can travel by private vehicle        Equipment Recommendations None recommended by PT  Recommendations for Other Services       Functional Status Assessment Patient has had a recent decline in their functional status and demonstrates the ability to make significant improvements in function in a reasonable and predictable amount of time.     Precautions / Restrictions Precautions Precautions: Fall Required Braces or Orthoses: Other Brace Other Brace: fracture boot left foot Restrictions Weight Bearing Restrictions: Yes LLE Weight Bearing: Weight bearing as tolerated      Mobility  Bed Mobility Overal bed mobility: Independent              General bed mobility comments: did not need assist to come to eOB    Transfers                   General transfer comment: Did not want to stand due to left ankle pain and high stretcher    Ambulation/Gait                  Stairs            Wheelchair Mobility     Tilt Bed    Modified Rankin (Stroke Patients Only)       Balance Overall balance assessment: Independent, Needs assistance Sitting-balance support: No upper extremity supported, Feet supported Sitting balance-Leahy Scale: Fair Sitting balance - Comments: No balance trouble in sitting                                     Pertinent Vitals/Pain Pain Assessment Pain Assessment: Faces Faces Pain Scale: Hurts little more Pain Location: left ankle Pain Descriptors / Indicators: Aching, Grimacing, Guarding Pain Intervention(s): Limited activity within patient's tolerance, Monitored during session, Repositioned    Home Living Family/patient expects to be discharged to:: Private residence Living Arrangements: Spouse/significant other Available Help at Discharge: Family;Available 24 hours/day Type of Home: House Home Access: Stairs to enter Entrance Stairs-Rails: None Entrance Stairs-Number of Steps: 2   Home Layout: One level;Other (Comment) (1 step to the dining room) Home Equipment: Rolling Walker (2 wheels);Cane - single point;Shower seat - built in;Wheelchair - manual  Prior Function Prior Level of Function : Independent/Modified Independent;Driving             Mobility Comments: rollator since fracture, furniture surfed in the home prior to fracture ADLs Comments: generally mod I     Extremity/Trunk Assessment   Upper Extremity Assessment Upper Extremity Assessment: Defer to OT evaluation    Lower Extremity Assessment Lower Extremity Assessment: LLE deficits/detail LLE Deficits / Details: grossly 2-/5    Cervical / Trunk  Assessment Cervical / Trunk Assessment: Normal  Communication   Communication Communication: No apparent difficulties Cueing Techniques: Verbal cues;Tactile cues;Visual cues  Cognition Arousal: Alert Behavior During Therapy: WFL for tasks assessed/performed Overall Cognitive Status: Within Functional Limits for tasks assessed                                          General Comments General comments (skin integrity, edema, etc.): VSS    Exercises     Assessment/Plan    PT Assessment Patient needs continued PT services  PT Problem List Decreased activity tolerance;Decreased balance;Decreased mobility;Decreased knowledge of use of DME;Decreased safety awareness;Decreased knowledge of precautions;Cardiopulmonary status limiting activity;Pain       PT Treatment Interventions DME instruction;Gait training;Functional mobility training;Therapeutic exercise;Therapeutic activities;Balance training;Patient/family education;Wheelchair mobility training    PT Goals (Current goals can be found in the Care Plan section)  Acute Rehab PT Goals Patient Stated Goal: to go home PT Goal Formulation: With patient Time For Goal Achievement: 08/21/23 Potential to Achieve Goals: Good    Frequency Min 1X/week     Co-evaluation               AM-PAC PT "6 Clicks" Mobility  Outcome Measure Help needed turning from your back to your side while in a flat bed without using bedrails?: None Help needed moving from lying on your back to sitting on the side of a flat bed without using bedrails?: None Help needed moving to and from a bed to a chair (including a wheelchair)?: Total Help needed standing up from a chair using your arms (e.g., wheelchair or bedside chair)?: Total Help needed to walk in hospital room?: Total Help needed climbing 3-5 steps with a railing? : Total 6 Click Score: 12    End of Session   Activity Tolerance: Patient limited by fatigue Patient left: with  call bell/phone within reach;with family/visitor present (on stretcher) Nurse Communication: Mobility status PT Visit Diagnosis: Unsteadiness on feet (R26.81);Muscle weakness (generalized) (M62.81)    Time: 1206-1224 PT Time Calculation (min) (ACUTE ONLY): 18 min   Charges:   PT Evaluation $PT Eval Moderate Complexity: 1 Mod   PT General Charges $$ ACUTE PT VISIT: 1 Visit         Airlie Blumenberg M,PT Acute Rehab Services [phone]   Bevelyn Buckles 08/07/2023, 2:08 PM

## 2023-08-07 NOTE — Progress Notes (Addendum)
STROKE TEAM PROGRESS NOTE   BRIEF HPI [name] is a 82 y.o. female with history of HTN, DM2, HLD presenting with recurrent transient LUE weakness and numbness. She reported that she had a syncopal episode on 10/15 in which she was going to the bathroom and woke up minutes later with extreme foot pain.  She reported going to her PCP who recommended she had an x-ray which revealed a break in one of the metatarsal bones of her left foot.  Yesterday on 10/22, she stood up abruptly felt left upper extremity weakness and noticed hemianopsia along with left-sided facial numbness and numbness/tingling in her left arm.  After talking with her family she decided come to the hospital.  She had MRI brain without contrast and MR cervical spine which showed acute small infarcts in the right cerebral hemisphere including the frontal and parietal lobes.  CT angiogram showed 70% stenosis of the right ICA consistent with right MCA territory infarcts.  Severe stenosis right vertebral artery V1 segment and occlusion of the V4.  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  SIGNIFICANT HOSPITAL EVENTS 10/22: admitted to the ED MRI brain, MR cervical spine 10/23: CT angio head neck  INTERIM HISTORY/SUBJECTIVE Met patient in ED. Patient's daughter was present, husband later joined. Pt reports history largely as above with the following changes: Pt reports that she does experience intermittent heart palpitations that she finds most noticeable when she is going room to room. Pt has not had any new symptoms. No nausea/vomiting, no vision changes, no chest pain. No new motor deficits, though she does mention pain from the broken foot.    OBJECTIVE  CBC    Component Value Date/Time   WBC 5.1 08/06/2023 1726   RBC 4.46 08/06/2023 1726   HGB 15.0 08/06/2023 1738   HGB 12.9 11/16/2021 0930   HGB 12.7 01/08/2017 1224   HCT 44.0 08/06/2023 1738   HCT 38.2 11/16/2021 0930   HCT 39.3 01/08/2017 1224   PLT  179 08/06/2023 1726   PLT 148 (L) 11/16/2021 0930   MCV 96.2 08/06/2023 1726   MCV 94 11/16/2021 0930   MCV 95.2 01/08/2017 1224   MCH 31.6 08/06/2023 1726   MCHC 32.9 08/06/2023 1726   RDW 13.2 08/06/2023 1726   RDW 13.1 11/16/2021 0930   RDW 12.9 01/08/2017 1224   LYMPHSABS 2.0 08/06/2023 1726   LYMPHSABS 2.3 01/08/2017 1224   MONOABS 0.4 08/06/2023 1726   MONOABS 0.8 01/08/2017 1224   EOSABS 0.4 08/06/2023 1726   EOSABS 0.2 01/08/2017 1224   BASOSABS 0.0 08/06/2023 1726   BASOSABS 0.0 01/08/2017 1224    BMET    Component Value Date/Time   NA 140 08/06/2023 1738   NA 141 08/05/2023 1157   NA 141 01/08/2017 1224   K 4.3 08/06/2023 1738   K 5.3 (H) 01/08/2017 1224   CL 104 08/06/2023 1738   CL 105 10/24/2012 1407   CO2 26 08/06/2023 1726   CO2 32 (H) 01/08/2017 1224   GLUCOSE 127 (H) 08/06/2023 1738   GLUCOSE 81 01/08/2017 1224   GLUCOSE 87 10/24/2012 1407   BUN 17 08/06/2023 1738   BUN 18 08/05/2023 1157   BUN 16.6 01/08/2017 1224   CREATININE 1.10 (H) 08/06/2023 1738   CREATININE 1.0 01/08/2017 1224   CALCIUM 9.3 08/06/2023 1726   CALCIUM 10.4 01/08/2017 1224   EGFR 64 08/05/2023 1157   GFRNONAA 58 (L) 08/06/2023 1726    IMAGING past  24 hours CT ANGIO HEAD NECK W WO CM  Result Date: 08/07/2023 CLINICAL DATA:  Weakness.  Acute neurologic deficit. EXAM: CT ANGIOGRAPHY HEAD AND NECK WITH AND WITHOUT CONTRAST TECHNIQUE: Multidetector CT imaging of the head and neck was performed using the standard protocol during bolus administration of intravenous contrast. Multiplanar CT image reconstructions and MIPs were obtained to evaluate the vascular anatomy. Carotid stenosis measurements (when applicable) are obtained utilizing NASCET criteria, using the distal internal carotid diameter as the denominator. RADIATION DOSE REDUCTION: This exam was performed according to the departmental dose-optimization program which includes automated exposure control, adjustment of the mA  and/or kV according to patient size and/or use of iterative reconstruction technique. CONTRAST:  60mL OMNIPAQUE IOHEXOL 350 MG/ML SOLN COMPARISON:  Brain MRI 08/06/2023 FINDINGS: CT HEAD FINDINGS Brain: There is no mass, hemorrhage or extra-axial collection. The size and configuration of the ventricles and extra-axial CSF spaces are normal. There is hypoattenuation of the white matter, most commonly indicating chronic small vessel disease. Known infarcts in the right hemisphere are not good the visible on this study. Skull: The visualized skull base, calvarium and extracranial soft tissues are normal. Sinuses/Orbits: No fluid levels or advanced mucosal thickening of the visualized paranasal sinuses. No mastoid or middle ear effusion. Normal orbits. CTA NECK FINDINGS SKELETON: No acute abnormality or high grade bony spinal canal stenosis. OTHER NECK: Normal pharynx, larynx and major salivary glands. No cervical lymphadenopathy. Unremarkable thyroid gland. UPPER CHEST: No pneumothorax or pleural effusion. No nodules or masses. AORTIC ARCH: There is calcific atherosclerosis of the aortic arch. Conventional 3 vessel aortic branching pattern. RIGHT CAROTID SYSTEM: No dissection, occlusion or aneurysm. There is heterogeneous mixed density atherosclerosis extending into the proximal ICA, resulting in 70% stenosis. LEFT CAROTID SYSTEM: Calcific atherosclerosis at the carotid bifurcation causing less than 50% stenosis proximal ICA. VERTEBRAL ARTERIES: Left dominant configuration. Mild narrowing of the left vertebral artery origin. Severe stenosis of the right V1 segment. Both vertebral arteries are patent to the skull base. CTA HEAD FINDINGS POSTERIOR CIRCULATION: --Vertebral arteries: The right V4 segment is occluded just proximal to the vertebrobasilar confluence. Normal left V4 segment. --Inferior cerebellar arteries: Normal. --Basilar artery: Normal. --Superior cerebellar arteries: Normal. --Posterior cerebral arteries  (PCA): Normal. ANTERIOR CIRCULATION: --Intracranial internal carotid arteries: Atherosclerotic calcification of both internal carotid arteries at the skull base without high-grade stenosis. 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 --Anterior cerebral arteries (ACA): Normal. --Middle cerebral arteries (MCA): Normal. VENOUS SINUSES: As permitted by contrast timing, patent. ANATOMIC VARIANTS: Fetal origin of the left posterior cerebral artery. Review of the MIP images confirms the above findings. IMPRESSION: 1. A 70% stenosis of the proximal right ICA due to mixed density atherosclerosis. This is a likely source of the previously demonstrated right MCA territory embolic infarcts. 2. Severe stenosis of the right vertebral artery V1 segment and occlusion of the V4 segment, just proximal to the vertebrobasilar confluence. Right PICA remains patent. 3. 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 Aortic Atherosclerosis (ICD10-I70.0). Electronically Signed   By: Deatra Robinson M.D.   On: 08/07/2023 02:54   MR BRAIN WO CONTRAST  Result Date: 08/06/2023 CLINICAL DATA:  Left arm weakness EXAM: MRI HEAD WITHOUT CONTRAST MRI CERVICAL SPINE WITHOUT CONTRAST TECHNIQUE: Multiplanar, multiecho pulse sequences of the brain and surrounding structures, and cervical spine, to include the craniocervical junction and cervicothoracic junction, were obtained without intravenous contrast. COMPARISON:  None Available.  FINDINGS: MRI HEAD FINDINGS Brain: There are multiple small foci of abnormal diffusion restriction in the right cerebral hemisphere, affecting the subcortical and deep white matter of the right frontal and parietal lobes. No acute or chronic hemorrhage. There is multifocal hyperintense T2-weighted signal within the white matter. Parenchymal volume and CSF spaces are normal. The midline structures are normal. Vascular: Normal flow voids. Skull and  upper cervical spine: Normal calvarium and skull base. Visualized upper cervical spine and soft tissues are normal. Sinuses/Orbits:Left mastoid fluid.  Ocular lens replacements. MRI CERVICAL SPINE FINDINGS Alignment: Reversal of normal cervical lordosis. No static subluxation. Vertebrae: Mild multilevel degenerative height loss. No acute fracture or discitis-osteomyelitis. Cord: Normal signal and morphology. Posterior Fossa, vertebral arteries, paraspinal tissues: Negative. Disc levels: C1-2: Unremarkable. C2-3: Normal disc space and facet joints. There is no spinal canal stenosis. No neural foraminal stenosis. C3-4: Disc space narrowing with small bulge and bilateral uncovertebral hypertrophy. There is no spinal canal stenosis. Mild bilateral neural foraminal stenosis. C4-5: Disc space narrowing with small bulge and uncovertebral hypertrophy. There is no spinal canal stenosis. No neural foraminal stenosis. C5-6: Disc space narrowing with small bulge and left-greater-than-right uncovertebral spurring. There is no spinal canal stenosis. Mild left neural foraminal stenosis. C6-7: Disc desiccation with minimal bulge. There is no spinal canal stenosis. No neural foraminal stenosis. C7-T1: Right subarticular disc osteophyte complex. There is no spinal canal stenosis. Moderate right neural foraminal stenosis. IMPRESSION: 1. Multiple small acute infarcts in the right cerebral hemisphere, affecting the subcortical and deep white matter of the right frontal and parietal lobes. No hemorrhage or mass effect. 2. Moderate right C7-T1 neural foraminal stenosis due to right subarticular disc osteophyte complex. 3. Mild bilateral C3-4 and left C5-6 neural foraminal stenosis. 4. No spinal canal stenosis. Electronically Signed   By: Deatra Robinson M.D.   On: 08/06/2023 20:59   MR Cervical Spine Wo Contrast  Result Date: 08/06/2023 CLINICAL DATA:  Left arm weakness EXAM: MRI HEAD WITHOUT CONTRAST MRI CERVICAL SPINE WITHOUT  CONTRAST TECHNIQUE: Multiplanar, multiecho pulse sequences of the brain and surrounding structures, and cervical spine, to include the craniocervical junction and cervicothoracic junction, were obtained without intravenous contrast. COMPARISON:  None Available. FINDINGS: MRI HEAD FINDINGS Brain: There are multiple small foci of abnormal diffusion restriction in the right cerebral hemisphere, affecting the subcortical and deep white matter of the right frontal and parietal lobes. No acute or chronic hemorrhage. There is multifocal hyperintense T2-weighted signal within the white matter. Parenchymal volume and CSF spaces are normal. The midline structures are normal. Vascular: Normal flow voids. Skull and upper cervical spine: Normal calvarium and skull base. Visualized upper cervical spine and soft tissues are normal. Sinuses/Orbits:Left mastoid fluid.  Ocular lens replacements. MRI CERVICAL SPINE FINDINGS Alignment: Reversal of normal cervical lordosis. No static subluxation. Vertebrae: Mild multilevel degenerative height loss. No acute fracture or discitis-osteomyelitis. Cord: Normal signal and morphology. Posterior Fossa, vertebral arteries, paraspinal tissues: Negative. Disc levels: C1-2: Unremarkable. C2-3: Normal disc space and facet joints. There is no spinal canal stenosis. No neural foraminal stenosis. C3-4: Disc space narrowing with small bulge and bilateral uncovertebral hypertrophy. There is no spinal canal stenosis. Mild bilateral neural foraminal stenosis. C4-5: Disc space narrowing with small bulge and uncovertebral hypertrophy. There is no spinal canal stenosis. No neural foraminal stenosis. C5-6: Disc space narrowing with small bulge and left-greater-than-right uncovertebral spurring. There is no spinal canal stenosis. Mild left neural foraminal stenosis. C6-7: Disc desiccation with minimal bulge. There is no spinal canal stenosis.  No neural foraminal stenosis. C7-T1: Right subarticular disc  osteophyte complex. There is no spinal canal stenosis. Moderate right neural foraminal stenosis. IMPRESSION: 1. Multiple small acute infarcts in the right cerebral hemisphere, affecting the subcortical and deep white matter of the right frontal and parietal lobes. No hemorrhage or mass effect. 2. Moderate right C7-T1 neural foraminal stenosis due to right subarticular disc osteophyte complex. 3. Mild bilateral C3-4 and left C5-6 neural foraminal stenosis. 4. No spinal canal stenosis. Electronically Signed   By: Deatra Robinson M.D.   On: 08/06/2023 20:59    Vitals:   08/07/23 0430 08/07/23 0445 08/07/23 0723 08/07/23 0900  BP: (!) 199/86 (!) 197/77 (!) 172/57 (!) 168/68  Pulse: (!) 59 (!) 59 (!) 58 (!) 57  Resp: 17  18 17   Temp:   98.3 F (36.8 C)   TempSrc:   Oral   SpO2: 95% 94% 96% 96%  Weight:   97.5 kg   Height:   5\' 11"  (1.803 m)      PHYSICAL EXAM General: Alert, well-nourished, well-developed patient in no acute distress Psych:  Mood and affect appropriate for situation CV: Regular rate and rhythm on monitor Respiratory:  Regular, unlabored respirations on room air GI: Abdomen soft and nontender  NEURO:  Mental Status: AA&Ox3, patient is able to give clear and coherent history Speech/Language: speech is without dysarthria or aphasia.  Naming, repetition, fluency, and comprehension intact.  Cranial Nerves:  II: PERRL. L heminopsia. Total visual field deficits beyond 10 of nasal bridge on L side.   III, IV, VI: EOMI. Eyelids elevate symmetrically.  V: Sensation is reduced in V1-V3 on left side of face. VII: Mild left nasolabial fold flattening. VIII: Hearing diminished on left side.  IX, X: Palate elevates symmetrically. Phonation is normal.  ZO:[id number] shrug on right limited by R shoulder injury. 4/5 on L XII: tongue is midline without fasciculations. Motor: 5/5 strength to all muscle groups tested on R. Left side deficits of 4/5 on LUE. Mild drift present on LUE. LLE 4/5.   Tone: is normal and bulk is normal Sensation- Intact to light touch on R. Left diminished sensation in LUE, no sensory change in LLE. Extinction absent to light touch to DSS.   Coordination: Left-sided difficulties in FTN. LUE drift.  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  CTA head & neck A 70% stenosis of the proximal right ICA due to mixed density atherosclerosis. This is a likely source of the previously demonstrated right MCA territory embolic infarcts. Severe stenosis of the right vertebral artery V1 segment and occlusion of the V4 segment, just proximal to the vertebrobasilar confluence. Right PICA remains patent.  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 MRI  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  2D Echo EF 65-70%, LA moderately dilated; RA mildly dilated LDL 112 HgbA1c 6.8 VTE prophylaxis - Lovenox 40 mg subcutaneous  No antithrombotic prior to admission, now on aspirin 81 mg daily . Patient has hx of GI bleeds with daily aspirin, consider plavix as an alternative if she cannot tolerate aspirin. Therapy recommendations:  Pending Disposition: TBD Pt has a cardiac arrhythmia and experiences sensations of heart palpitations on a regular basis.  Cardiac Loop recorder last transmitted data on 10/20, and showed no abnormal rhythms consistent with her syncopal episode  on 10/15.  Hx of Stroke/TIA NA  Hypertension Home meds:  losartan Stable, hold for permissive htn Blood Pressure Goal: BP less than 180/105   Hyperlipidemia Home meds:  rosuvastatin 40, resumed in hospital LDL 112, goal < 70 Continue statin at discharge  Diabetes type II Controlled Home meds:  metformin HgbA1c 6.8, goal < 7.0 CBGs SSI  Other Stroke Risk Factors Family hx heart disease (mother),  Fam hx stroke (brother)   Other Active Problems, managed per primary Fracture, Left 5th metatarsal Depression - continue sertraline GERD - Continue pantoprazole    Hospital day # 0  I have personally obtained history,examined this patient, reviewed notes, independently viewed imaging studies, participated in medical decision making and plan of care.ROS completed by me personally and pertinent positives fully documented  I have made any additions or clarifications directly to the above note. Agree with note above.  Patient presented with left hemiparesis secondary to symptomatic moderate right carotid stenosis and right brain watershed infarcts.  Recommend aspirin and Plavix and referred to vascular surgery for elective right carotid revascularization.  Continue ongoing stroke workup.  Aggressive risk factor modification.  Long discussion with patient and answered questions.  Discussed with Dr. Sharl Ma.  Greater than 50% time during this 50-minute visit was spent on counseling and coordination of care about his symptomatic carotid stenosis and discussion about treatment options and need for revascularization and answering questions.  Delia Heady, MD Medical Director [facility] Pager: [phone] 08/07/2023 5:45 PM   To contact Stroke Continuity provider, please refer to [email or web address]. After hours, contact General Neurology

## 2023-08-07 NOTE — Evaluation (Signed)
Occupational Therapy Evaluation Patient Details Name: [name] MRN: [id number] DOB: [birth year] Today's Date: 08/07/2023   History of Present Illness [name] is a 82 y.o. female admitted 10/22 who presents with intermittent LUE weakness and numbness. Episodes are occurring randomly and have been becoming longer and more frequent. 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 Pt with recent left ankle fracture with boot for ambulation.Supposed to have right TSA on 09/06/23.  PMH: HTN, DM2, HLD   Clinical Impression   Pt with c/o L ankle pain from a week PTA and has a hx of R rotator injury. Pt demonstrating a need for setup assistance with grooming, eating and UBD but would benefit from increased assessment of ability to complete ADLs with functional limitations of LUE and RUE. She needs Max A for LBD due to challenges with reaching BLEs by bending at the waist. Pt would benefit from continued acute skilled OT services to address deficits and help transition to next level of care. Patient would benefit from post acute Home OT services to help maximize functional independence in natural environment        If plan is discharge home, recommend the following: A little help with bathing/dressing/bathroom;Assistance with cooking/housework    Functional Status Assessment  Patient has had a recent decline in their functional status and demonstrates the ability to make significant improvements in function in a reasonable and predictable amount of time.  Equipment Recommendations  None recommended by OT (pt has rec DME)    Recommendations for Other Services       Precautions / Restrictions Precautions Precautions: Fall Required Braces or Orthoses: Other Brace Other Brace: fracture boot left foot Restrictions Weight Bearing Restrictions: Yes LLE Weight Bearing: Weight bearing as tolerated      Mobility Bed Mobility Overal bed mobility: Independent                   Transfers Overall transfer level: Needs assistance Equipment used: None Transfers: Sit to/from Stand Sit to Stand: Contact guard assist           General transfer comment: slow to rise, deferred gait due to L ankle pain.      Balance Overall balance assessment: Independent, Needs assistance Sitting-balance support: No upper extremity supported, Feet supported Sitting balance-Leahy Scale: Fair Sitting balance - Comments: sitting EOB   Standing balance support: No upper extremity supported Standing balance-Leahy Scale: Fair Standing balance comment: static standing no AD                           ADL either performed or assessed with clinical judgement   ADL Overall ADL's : Needs assistance/impaired Eating/Feeding: Set up;Sitting   Grooming: Sitting;Set up   Upper Body Bathing: Sitting;Set up;Supervision/ safety Upper Body Bathing Details (indicate cue type and reason): sponge bathing since ankle injury Lower Body Bathing: Minimal assistance;Sitting/lateral leans Lower Body Bathing Details (indicate cue type and reason): increased challenge with bending at waist   Upper Body Dressing Details (indicate cue type and reason): not fully assessed, pt reports she can perform this with increased time. Demonstrated compensatory strategy to use a tabletop to help with dressing due to R rotator injury Lower Body Dressing: Maximal assistance;Sitting/lateral leans Lower Body Dressing Details (indicate cue type and reason): to don bilat socks and LLE boot Toilet Transfer: Contact guard assist;Stand-pivot Toilet Transfer Details (indicate cue type and reason): sim at bedside using ED stretcher Toileting- Clothing  Manipulation and Hygiene: Contact guard assist;Sit to/from stand         General ADL Comments: Educated pt spouse on the use of gait belt to provided level of assist at home if needed. Pt spouse left shortly after gait belt training,was not able to  fully discuss the pt need for assist     Vision         Perception         Praxis         Pertinent Vitals/Pain Pain Assessment Pain Assessment: Faces Faces Pain Scale: Hurts little more Pain Location: left ankle Pain Descriptors / Indicators: Aching, Grimacing, Guarding Pain Intervention(s): Monitored during session, Repositioned     Extremity/Trunk Assessment Upper Extremity Assessment Upper Extremity Assessment: Right hand dominant;RUE deficits/detail;Generalized weakness;LUE deficits/detail RUE Deficits / Details: limited shoulder flexion due to prior rotator injury LUE Deficits / Details: generally weak, ROM WFL, sensation to touch not as strong as on R LUE Sensation: decreased light touch LUE Coordination: decreased fine motor   Lower Extremity Assessment Lower Extremity Assessment: LLE deficits/detail LLE Deficits / Details: grossly 2-/5   Cervical / Trunk Assessment Cervical / Trunk Assessment: Normal   Communication Communication Communication: No apparent difficulties Cueing Techniques: Verbal cues   Cognition Arousal: Alert Behavior During Therapy: WFL for tasks assessed/performed Overall Cognitive Status: Within Functional Limits for tasks assessed                                       General Comments  VSS. Pt worried about getting shoulder replacement in future, she even mentioned going to rehab to have more support-advised pt to bring up concerns with her physician when that time comes if she is still considering shoulder replacement    Exercises     Shoulder Instructions      Home Living Family/patient expects to be discharged to:: Private residence Living Arrangements: Spouse/significant other Available Help at Discharge: Family;Available PRN/intermittently Type of Home: House Home Access: Stairs to enter [employer] of Steps: 2 Entrance Stairs-Rails: None Home Layout: One level;Other (Comment) (1 step to the  dining room)     Bathroom Shower/Tub: Producer, television/film/video: Standard     Home Equipment: Agricultural consultant (2 wheels);Cane - single point;Shower seat - built in;Wheelchair - manual;BSC/3in1   Additional Comments: pt reports the husband is in/out and not typically home      Prior Functioning/Environment Prior Level of Function : Independent/Modified Independent;Driving             Mobility Comments: rollator since fracture, furniture surfed in the home prior to fracture ADLs Comments: generally mod I per pt report, takes increased time she states        OT Problem List: Decreased strength;Impaired balance (sitting and/or standing);Pain      OT Treatment/Interventions: Self-care/ADL training;Therapeutic activities;Therapeutic exercise;Patient/family education;Balance training    OT Goals(Current goals can be found in the care plan section) Acute Rehab OT Goals Patient Stated Goal: To get ankle feeling better OT Goal Formulation: With patient Time For Goal Achievement: 08/21/23 Potential to Achieve Goals: Good ADL Goals Pt Will Perform Grooming: sitting;with modified independence Pt Will Perform Lower Body Bathing: with caregiver independent in assisting;with set-up;sitting/lateral leans Pt Will Perform Lower Body Dressing: with set-up;sitting/lateral leans;with caregiver independent in assisting Pt Will Transfer to Toilet: stand pivot transfer;with supervision  OT Frequency: Min 1X/week    Co-evaluation  AM-PAC OT "6 Clicks" Daily Activity     Outcome Measure Help from another person eating meals?: A Little Help from another person taking care of personal grooming?: A Little Help from another person toileting, which includes using toliet, bedpan, or urinal?: A Little Help from another person bathing (including washing, rinsing, drying)?: A Little Help from another person to put on and taking off regular upper body clothing?: A Little Help  from another person to put on and taking off regular lower body clothing?: A Lot 6 Click Score: 17   End of Session Equipment Utilized During Treatment: Other (comment) (L boot) Nurse Communication: Mobility status  Activity Tolerance: Patient tolerated treatment well Patient left: in bed;with call bell/phone within reach  OT Visit Diagnosis: Other abnormalities of gait and mobility (R26.89);Muscle weakness (generalized) (M62.81);Pain Pain - Right/Left: Left Pain - part of body: Ankle and joints of foot                Time: 1610-9604 OT Time Calculation (min): 31 min Charges:  OT General Charges $OT Visit: 1 Visit OT Evaluation $OT Eval Moderate Complexity: 1 Mod OT Treatments $Therapeutic Activity: 8-22 mins  08/07/2023  AB, OTR/L  Acute Rehabilitation Services  Office: [phone]   Tristan Schroeder 08/07/2023, 4:15 PM

## 2023-08-07 NOTE — ED Notes (Signed)
ED TO INPATIENT HANDOFF REPORT  ED Nurse Name and Phone #: Dahlia Client [phone]  S Name/Age/Gender Gilford Silvius 82 y.o. female Room/Bed: 011C/011C  Code Status   Code Status: Full Code  Home/SNF/Other Home Patient oriented to: self, place, time, and situation Is this baseline? Yes   Triage Complete: Triage complete  Chief Complaint CVA (cerebral vascular accident) [facility]) [I63.9]  Triage Note Patient with intermittent LUE weakness, ongoing for the last few months. Family states the episode of weakness has lasted longer than normal today. She already had outpatient MRI planned but she called her PCP who advised her to come to the ED to expedite the process.    Allergies Allergies  Allergen Reactions   Adalat [Nifedipine] Other (See Comments)    Tired, Made pt "feel crazy in the head"   Hydrocodone Itching and Nausea And Vomiting   Meperidine Hcl Other (See Comments)    Demerol causes hallucinations   Naproxen Nausea And Vomiting    Level of Care/Admitting Diagnosis ED Disposition     ED Disposition  Admit   Condition  --   Comment  Hospital Area: [facility] [[id number]]  Level of Care: Telemetry Medical [104]  May place patient in observation at [facility] or [facility] if equivalent level of care is available:: No  Covid Evaluation: Asymptomatic - no recent exposure (last 10 days) testing not required  Diagnosis: CVA (cerebral vascular accident) [facility]) [[id number]]  Admitting Physician: Anselm Jungling [[id number]]  Attending Physician: Anselm Jungling [[id number]]          B Medical/Surgery History Past Medical History:  Diagnosis Date   Allergy    Anal or rectal pain    sometimes   Anemia    hx of during pregnancy   Anxiety    Arthritis    Asthma    hx of   Bradycardia    " I KNOW I HAVE BRADYCARDIA ESPECIALLY WHEN I SLEEP"    Cataract 2021   bilateral eyes   Chronic back pain    Degenerative joint disease    osteo   Depression    Diabetes mellitus  without complication (HCC)    DM type II   Diverticulosis 2003   Dysrhythmia    hx of  due to eye drop and also low heart rate 40's per pt. Dr. Allyson Sabal follows   Elevated total protein    Esophageal dysmotility    Fibromyalgia    GERD (gastroesophageal reflux disease)    subsequent Nissen Fundoplication   Glaucoma    Hearing loss    Heart murmur    "was told she had a heart murmur"   Hemorrhoids    Hiatal hernia 11/08/2009   Hx of adenomatous colonic polyps 07/02/2002   Hypercalcemia    Hyperlipidemia    Hyperlipidemia    Hypertension    Implantable loop recorder present 11/28/2021   Nausea    Osteoporosis    PONV (postoperative nausea and vomiting)    Rectal bleeding    from hemorrhoids.     Sleep apnea    DOES USE CPAP    Thrombocytopenia (HCC)    Varicose veins of left lower extremity    Past Surgical History:  Procedure Laterality Date   CARDIAC CATHETERIZATION  03/14/1992   Normal cardiac cath. Normal LV function.   CARDIOVASCULAR STRESS TEST  01/22/2011   No scintigraphic evidence of inducible ischemia.   CAROTID DOPPLER  03/31/2007   Bilateral ICAs - no evidence of  significant diameter reduction, dissectin, tortuosity, FMD, or any other vascular abnormality.   CATARACT EXTRACTION, BILATERAL     ESOPHAGEAL MANOMETRY N/A 11/14/2015   Procedure: ESOPHAGEAL MANOMETRY (EM);  Surgeon: Napoleon Form, MD;  Location: [facility]COPY;  Service: Endoscopy;  Laterality: N/A;   ESOPHAGEAL MANOMETRY N/A 01/18/2021   Procedure: ESOPHAGEAL MANOMETRY (EM);  Surgeon: Napoleon Form, MD;  Location: [facility];  Service: Endoscopy;  Laterality: N/A;   ESOPHAGOGASTRODUODENOSCOPY N/A 03/08/2021   Procedure: ESOPHAGOGASTRODUODENOSCOPY (EGD);  Surgeon: Corliss Skains, MD;  Location: [facility] OR;  Service: Thoracic;  Laterality: N/A;   EYE SURGERY     bilateral cataracts; bilateral stents   GASTRIC RESECTION  2009   GLAUCOMA SURGERY     JOINT REPLACEMENT     right knee Dr. Lequita Halt  06-23-18   KNEE SURGERY Bilateral    NISSEN FUNDOPLICATION  2000   with subsequent takedown in 2009   TOTAL KNEE ARTHROPLASTY Left 06/10/2017   Procedure: LEFT  TOTAL KNEE ARTHROPLASTY;  Surgeon: Ollen Gross, MD;  Location: [facility] ORS;  Service: Orthopedics;  Laterality: Left;  Adductor Block   TOTAL KNEE ARTHROPLASTY Right 06/23/2018   Procedure: RIGHT TOTAL KNEE ARTHROPLASTY;  Surgeon: Ollen Gross, MD;  Location: [facility] ORS;  Service: Orthopedics;  Laterality: Right;   TRANSTHORACIC ECHOCARDIOGRAM  12/21/2010   EF 60%, moderate LVH,    TUBAL LIGATION     XI ROBOTIC ASSISTED HIATAL HERNIA REPAIR N/A 03/08/2021   Procedure: XI ROBOTIC ASSISTED LAPAROSCOPY WITH LYSIS OF ADHESIONS;  Surgeon: Corliss Skains, MD;  Location: [facility] OR;  Service: Thoracic;  Laterality: N/A;     A IV Location/Drains/Wounds Patient Lines/Drains/Airways Status     Active Line/Drains/Airways     Name Placement date Placement time Site Days   Peripheral IV 08/07/23 20 G Left Antecubital 08/07/23  0149  Antecubital  less than 1            Intake/Output Last 24 hours No intake or output data in the 24 hours ending 08/07/23 0740  Labs/Imaging Results for orders placed or performed during the hospital encounter of 08/06/23 (from the past 48 hour(s))  Ethanol     Status: None   Collection Time: 08/06/23  5:26 PM  Result Value Ref Range   Alcohol, Ethyl (B) <10 <10 mg/dL    Comment: (NOTE) Lowest detectable limit for serum alcohol is 10 mg/dL.  For medical purposes only. Performed at [facility], [address], [place], [place] [address]   Protime-INR     Status: None   Collection Time: 08/06/23  5:26 PM  Result Value Ref Range   Prothrombin Time 13.7 11.4 - 15.2 seconds   INR 1.0 0.8 - 1.2    Comment: (NOTE) INR goal varies based on device and disease states. Performed at [facility], [address], [place], [place] [address]   APTT     Status: None   Collection Time: 08/06/23  5:26  PM  Result Value Ref Range   aPTT 24 24 - 36 seconds    Comment: Performed at [facility], [address], [place], [place] [address]  CBC     Status: None   Collection Time: 08/06/23  5:26 PM  Result Value Ref Range   WBC 5.1 4.0 - 10.5 K/uL   RBC 4.46 3.87 - 5.11 MIL/uL   Hemoglobin 14.1 12.0 - 15.0 g/dL   HCT 41.3 24.4 - 01.0 %   MCV 96.2 80.0 - 100.0  fL   MCH 31.6 26.0 - 34.0 pg   MCHC 32.9 30.0 - 36.0 g/dL   RDW 69.6 29.5 - 28.4 %   Platelets 179 150 - 400 K/uL   nRBC 0.0 0.0 - 0.2 %    Comment: Performed at [facility], [address], [place], [place] [address]  Differential     Status: None   Collection Time: 08/06/23  5:26 PM  Result Value Ref Range   Neutrophils Relative % 45 %   Neutro Abs 2.3 1.7 - 7.7 K/uL   Lymphocytes Relative 39 %   Lymphs Abs 2.0 0.7 - 4.0 K/uL   Monocytes Relative 9 %   Monocytes Absolute 0.4 0.1 - 1.0 K/uL   Eosinophils Relative 7 %   Eosinophils Absolute 0.4 0.0 - 0.5 K/uL   Basophils Relative 0 %   Basophils Absolute 0.0 0.0 - 0.1 K/uL   Immature Granulocytes 0 %   Abs Immature Granulocytes 0.01 0.00 - 0.07 K/uL    Comment: Performed at [facility], [address], [place], [place] [address]  Comprehensive metabolic panel     Status: Abnormal   Collection Time: 08/06/23  5:26 PM  Result Value Ref Range   Sodium 138 135 - 145 mmol/L   Potassium 4.4 3.5 - 5.1 mmol/L   Chloride 105 98 - 111 mmol/L   CO2 26 22 - 32 mmol/L   Glucose, Bld 122 (H) 70 - 99 mg/dL    Comment: Glucose reference range applies only to samples taken after fasting for at least 8 hours.   BUN 15 8 - 23 mg/dL   Creatinine, Ser 2.53 0.44 - 1.00 mg/dL   Calcium 9.3 8.9 - 66.4 mg/dL   Total Protein 6.7 6.5 - 8.1 g/dL   Albumin 3.6 3.5 - 5.0 g/dL   AST 33 15 - 41 U/L   ALT 20 0 - 44 U/L   Alkaline Phosphatase 69 38 - 126 U/L   Total Bilirubin 0.7 0.3 - 1.2 mg/dL   GFR, Estimated 58 (L) >60 mL/min    Comment: (NOTE) Calculated using the  CKD-EPI Creatinine Equation (2021)    Anion gap 7 5 - 15    Comment: Performed at [facility], [address] [place], [place] [address]  I-stat chem 8, ED     Status: Abnormal   Collection Time: 08/06/23  5:38 PM  Result Value Ref Range   Sodium 140 135 - 145 mmol/L   Potassium 4.3 3.5 - 5.1 mmol/L   Chloride 104 98 - 111 mmol/L   BUN 17 8 - 23 mg/dL   Creatinine, Ser 4.25 (H) 0.44 - 1.00 mg/dL   Glucose, Bld 956 (H) 70 - 99 mg/dL    Comment: Glucose reference range applies only to samples taken after fasting for at least 8 hours.   Calcium, Ion 1.18 1.15 - 1.40 mmol/L   TCO2 26 22 - 32 mmol/L   Hemoglobin 15.0 12.0 - 15.0 g/dL   HCT 38.7 56.4 - 33.2 %  Urine rapid drug screen (hosp performed)     Status: None   Collection Time: 08/06/23  6:02 PM  Result Value Ref Range   Opiates NONE DETECTED NONE DETECTED   Cocaine NONE DETECTED NONE DETECTED   Benzodiazepines NONE DETECTED NONE DETECTED   Amphetamines NONE DETECTED NONE DETECTED   Tetrahydrocannabinol NONE DETECTED NONE DETECTED   Barbiturates NONE DETECTED NONE DETECTED    Comment: (NOTE) DRUG SCREEN FOR MEDICAL  PURPOSES ONLY.  IF CONFIRMATION IS NEEDED FOR ANY PURPOSE, NOTIFY LAB WITHIN 5 DAYS.  LOWEST DETECTABLE LIMITS FOR URINE DRUG SCREEN Drug Class                     Cutoff (ng/mL) Amphetamine and metabolites    1000 Barbiturate and metabolites    200 Benzodiazepine                 200 Opiates and metabolites        300 Cocaine and metabolites        300 THC                            50 Performed at [facility], [address], [place], [place] [address]   Urinalysis, Routine w reflex microscopic -Urine, Clean Catch     Status: None   Collection Time: 08/06/23  6:02 PM  Result Value Ref Range   Color, Urine YELLOW YELLOW   APPearance CLEAR CLEAR   Specific Gravity, Urine 1.012 1.005 - 1.030   pH 6.0 5.0 - 8.0   Glucose, UA NEGATIVE NEGATIVE mg/dL   Hgb urine dipstick NEGATIVE NEGATIVE    Bilirubin Urine NEGATIVE NEGATIVE   Ketones, ur NEGATIVE NEGATIVE mg/dL   Protein, ur NEGATIVE NEGATIVE mg/dL   Nitrite NEGATIVE NEGATIVE   Leukocytes,Ua NEGATIVE NEGATIVE    Comment: Performed at [facility], [address] [place], [place] [address]  Lipid panel     Status: Abnormal   Collection Time: 08/07/23  3:28 AM  Result Value Ref Range   Cholesterol 192 0 - 200 mg/dL   Triglycerides 96 <865 mg/dL   HDL 61 >78 mg/dL   Total CHOL/HDL Ratio 3.1 RATIO   VLDL 19 0 - 40 mg/dL   LDL Cholesterol 469 (H) 0 - 99 mg/dL    Comment:        Total Cholesterol/HDL:CHD Risk Coronary Heart Disease Risk Table                     Men   Women  1/2 Average Risk   3.4   3.3  Average Risk       5.0   4.4  2 X Average Risk   9.6   7.1  3 X Average Risk  23.4   11.0        Use the calculated Patient Ratio above and the CHD Risk Table to determine the patient's CHD Risk.        ATP III CLASSIFICATION (LDL):  <100     mg/dL   Optimal  629-528  mg/dL   Near or Above                    Optimal  130-159  mg/dL   Borderline  413-244  mg/dL   High  >010     mg/dL   Very High Performed at [facility], [address], [place], [place] [address]   Hemoglobin A1c     Status: Abnormal   Collection Time: 08/07/23  3:28 AM  Result Value Ref Range   Hgb A1c MFr Bld 6.8 (H) 4.8 - 5.6 %    Comment: (NOTE) Pre diabetes:          5.7%-6.4%  Diabetes:              >6.4%  Glycemic control for   <  7.0% adults with diabetes    Mean Plasma Glucose 148.46 mg/dL    Comment: Performed at [facility], [address], [place], [place] [address]   CT [facility] HEAD NECK W WO CM  Result Date: 08/07/2023 CLINICAL DATA:  Weakness.  Acute neurologic deficit. EXAM: CT ANGIOGRAPHY HEAD AND NECK WITH AND WITHOUT CONTRAST TECHNIQUE: Multidetector CT imaging of the head and neck was performed using the standard protocol during bolus administration of intravenous contrast. Multiplanar CT image  reconstructions and MIPs were obtained to evaluate the vascular anatomy. Carotid stenosis measurements (when applicable) are obtained utilizing NASCET criteria, using the distal internal carotid diameter as the denominator. RADIATION DOSE REDUCTION: This exam was performed according to the departmental dose-optimization program which includes automated exposure control, adjustment of the mA and/or kV according to patient size and/or use of iterative reconstruction technique. CONTRAST:  60mL OMNIPAQUE IOHEXOL 350 MG/ML SOLN COMPARISON:  Brain MRI 08/06/2023 FINDINGS: CT HEAD FINDINGS Brain: There is no mass, hemorrhage or extra-axial collection. The size and configuration of the ventricles and extra-axial CSF spaces are normal. There is hypoattenuation of the white matter, most commonly indicating chronic small vessel disease. Known infarcts in the right hemisphere are not good the visible on this study. Skull: The visualized skull base, calvarium and extracranial soft tissues are normal. Sinuses/Orbits: No fluid levels or advanced mucosal thickening of the visualized paranasal sinuses. No mastoid or middle ear effusion. Normal orbits. CTA NECK FINDINGS SKELETON: No acute abnormality or high grade bony spinal canal stenosis. OTHER NECK: Normal pharynx, larynx and major salivary glands. No cervical lymphadenopathy. Unremarkable thyroid gland. UPPER CHEST: No pneumothorax or pleural effusion. No nodules or masses. AORTIC ARCH: There is calcific atherosclerosis of the aortic arch. Conventional 3 vessel aortic branching pattern. RIGHT CAROTID SYSTEM: No dissection, occlusion or aneurysm. There is heterogeneous mixed density atherosclerosis extending into the proximal ICA, resulting in 70% stenosis. LEFT CAROTID SYSTEM: Calcific atherosclerosis at the carotid bifurcation causing less than 50% stenosis proximal ICA. VERTEBRAL ARTERIES: Left dominant configuration. Mild narrowing of the left vertebral artery origin. Severe  stenosis of the right V1 segment. Both vertebral arteries are patent to the skull base. CTA HEAD FINDINGS POSTERIOR CIRCULATION: --Vertebral arteries: The right V4 segment is occluded just proximal to the vertebrobasilar confluence. Normal left V4 segment. --Inferior cerebellar arteries: Normal. --Basilar artery: Normal. --Superior cerebellar arteries: Normal. --Posterior cerebral arteries (PCA): Normal. ANTERIOR CIRCULATION: --Intracranial internal carotid arteries: Atherosclerotic calcification of both internal carotid arteries at the skull base without high-grade stenosis. 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 --Anterior cerebral arteries (ACA): Normal. --Middle cerebral arteries (MCA): Normal. VENOUS SINUSES: As permitted by contrast timing, patent. ANATOMIC VARIANTS: Fetal origin of the left posterior cerebral artery. Review of the MIP images confirms the above findings. IMPRESSION: 1. A 70% stenosis of the proximal right ICA due to mixed density atherosclerosis. This is a likely source of the previously demonstrated right MCA territory embolic infarcts. 2. Severe stenosis of the right vertebral artery V1 segment and occlusion of the V4 segment, just proximal to the vertebrobasilar confluence. Right PICA remains patent. 3. 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 Aortic Atherosclerosis (ICD10-I70.0). Electronically Signed   By: Deatra Robinson M.D.   On: 08/07/2023 02:54   MR BRAIN WO CONTRAST  Result Date: 08/06/2023 CLINICAL DATA:  Left arm weakness EXAM: MRI HEAD WITHOUT CONTRAST MRI CERVICAL SPINE WITHOUT CONTRAST TECHNIQUE: Multiplanar,  multiecho pulse sequences of the brain and surrounding structures, and cervical spine, to include the craniocervical junction and cervicothoracic junction, were obtained without intravenous contrast. COMPARISON:  None Available. FINDINGS: MRI HEAD FINDINGS Brain: There are multiple  small foci of abnormal diffusion restriction in the right cerebral hemisphere, affecting the subcortical and deep white matter of the right frontal and parietal lobes. No acute or chronic hemorrhage. There is multifocal hyperintense T2-weighted signal within the white matter. Parenchymal volume and CSF spaces are normal. The midline structures are normal. Vascular: Normal flow voids. Skull and upper cervical spine: Normal calvarium and skull base. Visualized upper cervical spine and soft tissues are normal. Sinuses/Orbits:Left mastoid fluid.  Ocular lens replacements. MRI CERVICAL SPINE FINDINGS Alignment: Reversal of normal cervical lordosis. No static subluxation. Vertebrae: Mild multilevel degenerative height loss. No acute fracture or discitis-osteomyelitis. Cord: Normal signal and morphology. Posterior Fossa, vertebral arteries, paraspinal tissues: Negative. Disc levels: C1-2: Unremarkable. C2-3: Normal disc space and facet joints. There is no spinal canal stenosis. No neural foraminal stenosis. C3-4: Disc space narrowing with small bulge and bilateral uncovertebral hypertrophy. There is no spinal canal stenosis. Mild bilateral neural foraminal stenosis. C4-5: Disc space narrowing with small bulge and uncovertebral hypertrophy. There is no spinal canal stenosis. No neural foraminal stenosis. C5-6: Disc space narrowing with small bulge and left-greater-than-right uncovertebral spurring. There is no spinal canal stenosis. Mild left neural foraminal stenosis. C6-7: Disc desiccation with minimal bulge. There is no spinal canal stenosis. No neural foraminal stenosis. C7-T1: Right subarticular disc osteophyte complex. There is no spinal canal stenosis. Moderate right neural foraminal stenosis. IMPRESSION: 1. Multiple small acute infarcts in the right cerebral hemisphere, affecting the subcortical and deep white matter of the right frontal and parietal lobes. No hemorrhage or mass effect. 2. Moderate right C7-T1  neural foraminal stenosis due to right subarticular disc osteophyte complex. 3. Mild bilateral C3-4 and left C5-6 neural foraminal stenosis. 4. No spinal canal stenosis. Electronically Signed   By: Deatra Robinson M.D.   On: 08/06/2023 20:59   MR Cervical Spine Wo Contrast  Result Date: 08/06/2023 CLINICAL DATA:  Left arm weakness EXAM: MRI HEAD WITHOUT CONTRAST MRI CERVICAL SPINE WITHOUT CONTRAST TECHNIQUE: Multiplanar, multiecho pulse sequences of the brain and surrounding structures, and cervical spine, to include the craniocervical junction and cervicothoracic junction, were obtained without intravenous contrast. COMPARISON:  None Available. FINDINGS: MRI HEAD FINDINGS Brain: There are multiple small foci of abnormal diffusion restriction in the right cerebral hemisphere, affecting the subcortical and deep white matter of the right frontal and parietal lobes. No acute or chronic hemorrhage. There is multifocal hyperintense T2-weighted signal within the white matter. Parenchymal volume and CSF spaces are normal. The midline structures are normal. Vascular: Normal flow voids. Skull and upper cervical spine: Normal calvarium and skull base. Visualized upper cervical spine and soft tissues are normal. Sinuses/Orbits:Left mastoid fluid.  Ocular lens replacements. MRI CERVICAL SPINE FINDINGS Alignment: Reversal of normal cervical lordosis. No static subluxation. Vertebrae: Mild multilevel degenerative height loss. No acute fracture or discitis-osteomyelitis. Cord: Normal signal and morphology. Posterior Fossa, vertebral arteries, paraspinal tissues: Negative. Disc levels: C1-2: Unremarkable. C2-3: Normal disc space and facet joints. There is no spinal canal stenosis. No neural foraminal stenosis. C3-4: Disc space narrowing with small bulge and bilateral uncovertebral hypertrophy. There is no spinal canal stenosis. Mild bilateral neural foraminal stenosis. C4-5: Disc space narrowing with small bulge and  uncovertebral hypertrophy. There is no spinal canal stenosis. No neural foraminal stenosis. C5-6: Disc space narrowing  with small bulge and left-greater-than-right uncovertebral spurring. There is no spinal canal stenosis. Mild left neural foraminal stenosis. C6-7: Disc desiccation with minimal bulge. There is no spinal canal stenosis. No neural foraminal stenosis. C7-T1: Right subarticular disc osteophyte complex. There is no spinal canal stenosis. Moderate right neural foraminal stenosis. IMPRESSION: 1. Multiple small acute infarcts in the right cerebral hemisphere, affecting the subcortical and deep white matter of the right frontal and parietal lobes. No hemorrhage or mass effect. 2. Moderate right C7-T1 neural foraminal stenosis due to right subarticular disc osteophyte complex. 3. Mild bilateral C3-4 and left C5-6 neural foraminal stenosis. 4. No spinal canal stenosis. Electronically Signed   By: Deatra Robinson M.D.   On: 08/06/2023 20:59    Pending Labs Unresulted Labs (From admission, onward)    None       Vitals/Pain Today's Vitals   08/07/23 0412 08/07/23 0430 08/07/23 0445 08/07/23 0723  BP:  (!) 199/86 (!) 197/77 (!) 172/57  Pulse: 77 (!) 59 (!) 59 (!) 58  Resp: 17 17  18   Temp:    98.3 F (36.8 C)  TempSrc:    Oral  SpO2: 93% 95% 94% 96%  Weight:    97.5 kg  Height:    5\' 11"  (1.803 m)  PainSc:    6     Isolation Precautions No active isolations  Medications Medications  aspirin EC tablet 81 mg (has no administration in time range)   stroke: early stages of recovery book (has no administration in time range)  acetaminophen (TYLENOL) tablet 650 mg (650 mg Oral Given 08/07/23 0726)    Or  acetaminophen (TYLENOL) 160 MG/5ML solution 650 mg ( Per Tube See Alternative 08/07/23 0726)    Or  acetaminophen (TYLENOL) suppository 650 mg ( Rectal See Alternative 08/07/23 0726)  senna-docusate (Senokot-S) tablet 1 tablet (has no administration in time range)  enoxaparin  (LOVENOX) injection 40 mg (has no administration in time range)  rosuvastatin (CRESTOR) tablet 40 mg (has no administration in time range)  sertraline (ZOLOFT) tablet 100 mg (has no administration in time range)  sucralfate (CARAFATE) tablet 1 g (has no administration in time range)  pantoprazole (PROTONIX) EC tablet 40 mg (has no administration in time range)  ascorbic acid (VITAMIN C) tablet 500 mg (has no administration in time range)  cholecalciferol (VITAMIN D3) 25 MCG (1000 UNIT) tablet 10,000 Units (has no administration in time range)  zinc sulfate capsule 220 mg (has no administration in time range)  loratadine (CLARITIN) tablet 10 mg (has no administration in time range)  montelukast (SINGULAIR) tablet 10 mg (has no administration in time range)  brimonidine (ALPHAGAN) 0.2 % ophthalmic solution 1 drop (has no administration in time range)  latanoprost (XALATAN) 0.005 % ophthalmic solution 1 drop (has no administration in time range)  hydrALAZINE (APRESOLINE) injection 5 mg (has no administration in time range)  iohexol (OMNIPAQUE) 350 MG/ML injection 75 mL (60 mLs Intravenous Contrast Given 08/07/23 0216)    Mobility walks     Focused Assessments Neuro Assessment Handoff:  Swallow screen pass? Yes    NIH Stroke Scale  Dizziness Present: No Headache Present: No Interval: Shift assessment Level of Consciousness (1a.)   : Alert, keenly responsive LOC Questions (1b. )   : Answers both questions correctly LOC Commands (1c. )   : Performs both tasks correctly Best Gaze (2. )  : Normal Visual (3. )  : No visual loss Facial Palsy (4. )    : Normal symmetrical movements  Motor Arm, Left (5a. )   : No drift Motor Arm, Right (5b. ) : No drift Motor Leg, Left (6a. )  : No drift Motor Leg, Right (6b. ) : No drift Limb Ataxia (7. ): Absent Sensory (8. )  : Mild-to-moderate sensory loss, patient feels pinprick is less sharp or is dull on the affected side, or there is a loss of  superficial pain with pinprick, but patient is aware of being touched (mild decreased sensation to LUE and LLE) Best Language (9. )  : No aphasia Dysarthria (10. ): Normal Extinction/Inattention (11.)   : No Abnormality Complete NIHSS TOTAL: 1     Neuro Assessment: Within Defined Limits Neuro Checks:   Initial (08/06/23 1711)  Has TPA been given? No If patient is a Neuro Trauma and patient is going to OR before floor call report to 4N Charge nurse: [phone] or [phone]   R Recommendations: See Admitting Provider Note  Report given to:   Additional Notes:

## 2023-08-07 NOTE — Assessment & Plan Note (Signed)
- 

## 2023-08-07 NOTE — Progress Notes (Signed)
  Echocardiogram 2D Echocardiogram has been performed.  Augustine Radar 08/07/2023, 9:05 AM

## 2023-08-07 NOTE — Progress Notes (Signed)
[facility]  PROGRESS NOTE  LENI RABUN JWJ:[id number] DOB: 1941/05/09 DOA: 08/06/2023 PCP: [name], MD   Brief HPI:   82 y.o. female with medical history significant of HTN, T2DM, HLD, anxiety, GERD, recurrent syncope s/p loop recorder who presented with intermittent LUE weakness and paresthesia.  As per patient for past 3 to 4 months she noticed intermittent weakness and numbness of left arm from shoulder to wrist.  On arrival to ED blood pressure was high at 197/62.  MRI brain showed multiple small acute infarcts of the right cerebral hemisphere.  She reports remote history of GI bleed following taking DVT prophylaxis for knee replacement.    Assessment/Plan:   CVA -MRI brain showed multiple small acute infarcts in the right cerebral hemisphere -CT head and neck showed 70% stenosis of the proximal right ICA with mixed density atherosclerosis, 6.4 mm.  Projecting aneurysm of the left ICA.  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 -Echocardiogram showed EF of 65 to 70%, mild LVH, grade 1 diastolic dysfunction -Started on aspirin, no Plavix due to previous history of GI bleed -Continue permissive hypertension -Neurology following  Hypertension -Blood pressure is mild elevated for permissive hypertension -Losartan on hold  Depression -Continue sertraline  Closed nondisplaced fracture of the fifth metatarsal bone of the left foot -Remains immobilized in cam boot -Follows podiatry as outpatient  GERD -Continue pantoprazole    Medications      stroke: early stages of recovery book   Does not apply Once   ascorbic acid  500 mg Oral Daily   aspirin EC  81 mg Oral Daily   brimonidine  1 drop Both Eyes BID   cholecalciferol  10,000 Units Oral Daily   enoxaparin (LOVENOX) injection  40 mg Subcutaneous Q24H   latanoprost  1 drop Right Eye QHS   loratadine  10 mg Oral Daily   montelukast  10 mg Oral QHS   pantoprazole   40 mg Oral Daily   rosuvastatin  40 mg Oral QHS   sertraline  100 mg Oral q morning   zinc sulfate  220 mg Oral Daily     Data Reviewed:   CBG:  No results for input(s): "GLUCAP" in the last 168 hours.  SpO2: 96 %    Vitals:   08/07/23 0412 08/07/23 0430 08/07/23 0445 08/07/23 0723  BP:  (!) 199/86 (!) 197/77 (!) 172/57  Pulse: 77 (!) 59 (!) 59 (!) 58  Resp: 17 17  18   Temp:    98.3 F (36.8 C)  TempSrc:    Oral  SpO2: 93% 95% 94% 96%  Weight:    97.5 kg  Height:    5\' 11"  (1.803 m)      Data Reviewed:  Basic Metabolic Panel: Recent Labs  Lab 08/05/23 1157 08/06/23 1726 08/06/23 1738  NA 141 138 140  K 4.4 4.4 4.3  CL 103 105 104  CO2 27 26  --   GLUCOSE 116* 122* 127*  BUN 18 15 17   CREATININE 0.91 0.99 1.10*  CALCIUM 10.1 9.3  --     CBC: Recent Labs  Lab 08/06/23 1726 08/06/23 1738  WBC 5.1  --   NEUTROABS 2.3  --   HGB 14.1 15.0  HCT 42.9 44.0  MCV 96.2  --   PLT 179  --     LFT Recent Labs  Lab 08/06/23 1726  AST 33  ALT 20  ALKPHOS 69  BILITOT  0.7  PROT 6.7  ALBUMIN 3.6     Antibiotics: Anti-infectives (From admission, onward)    None        DVT prophylaxis: Lovenox  Code Status: Full code  Family Communication: Discussed with patient's husband at bedside   CONSULTS neurology   Subjective   Continues to have mild weakness of left upper and lower extremity.  Denies difficulty swallowing.  Passed swallow evaluation test.   Objective    Physical Examination:   General-appears in no acute distress Heart-S1-S2, regular, no murmur auscultated Lungs-clear to auscultation bilaterally, no wheezing or crackles auscultated Abdomen-soft, nontender, no organomegaly Extremities-no edema in the lower extremities Neuro-alert, oriented x3, motor strength 4/5 in left upper and lower extremity  Status is: Inpatient:             Meredeth Ide   [facility] If 7PM-7AM, please contact night-coverage at  [email or web address], Office  [phone]   08/07/2023, 8:31 AM  LOS: 0 days

## 2023-08-07 NOTE — Assessment & Plan Note (Signed)
-  elevated but allowing for permissive HTN -she only takes Losartan PRN at home due to hx of recurrent syncope from hypotension

## 2023-08-08 ENCOUNTER — Telehealth: Payer: Self-pay

## 2023-08-08 ENCOUNTER — Other Ambulatory Visit (HOSPITAL_COMMUNITY): Payer: Self-pay

## 2023-08-08 DIAGNOSIS — I6300 Cerebral infarction due to thrombosis of unspecified precerebral artery: Secondary | ICD-10-CM

## 2023-08-08 DIAGNOSIS — I6522 Occlusion and stenosis of left carotid artery: Secondary | ICD-10-CM | POA: Diagnosis not present

## 2023-08-08 DIAGNOSIS — I63421 Cerebral infarction due to embolism of right anterior cerebral artery: Secondary | ICD-10-CM | POA: Diagnosis not present

## 2023-08-08 LAB — CBC
HCT: 38.3 % (ref 36.0–46.0)
Hemoglobin: 12.7 g/dL (ref 12.0–15.0)
MCH: 30.6 pg (ref 26.0–34.0)
MCHC: 33.2 g/dL (ref 30.0–36.0)
MCV: 92.3 fL (ref 80.0–100.0)
Platelets: 166 10*3/uL (ref 150–400)
RBC: 4.15 MIL/uL (ref 3.87–5.11)
RDW: 12.9 % (ref 11.5–15.5)
WBC: 4.8 10*3/uL (ref 4.0–10.5)
nRBC: 0 % (ref 0.0–0.2)

## 2023-08-08 LAB — BASIC METABOLIC PANEL
Anion gap: 9 (ref 5–15)
BUN: 16 mg/dL (ref 8–23)
CO2: 23 mmol/L (ref 22–32)
Calcium: 9.1 mg/dL (ref 8.9–10.3)
Chloride: 105 mmol/L (ref 98–111)
Creatinine, Ser: 1.02 mg/dL — ABNORMAL HIGH (ref 0.44–1.00)
GFR, Estimated: 56 mL/min — ABNORMAL LOW (ref >60)
Glucose, Bld: 114 mg/dL — ABNORMAL HIGH (ref 70–99)
Potassium: 3.9 mmol/L (ref 3.5–5.1)
Sodium: 137 mmol/L (ref 135–145)

## 2023-08-08 LAB — TYPE AND SCREEN
ABO/RH(D): B POS
Antibody Screen: NEGATIVE

## 2023-08-08 LAB — SURGICAL PCR SCREEN
MRSA, PCR: NEGATIVE
Staphylococcus aureus: NEGATIVE

## 2023-08-08 MED ORDER — CLOPIDOGREL BISULFATE 75 MG PO TABS
75.0000 mg | ORAL_TABLET | Freq: Every day | ORAL | 1 refills | Status: AC
  Filled 2023-08-08 – 2023-08-12 (×2): qty 30, 30d supply, fill #0

## 2023-08-08 MED ORDER — CLOPIDOGREL BISULFATE 75 MG PO TABS
300.0000 mg | ORAL_TABLET | Freq: Once | ORAL | Status: AC
  Administered 2023-08-08: 300 mg via ORAL
  Filled 2023-08-08: qty 4

## 2023-08-08 MED ORDER — CLOPIDOGREL BISULFATE 75 MG PO TABS
75.0000 mg | ORAL_TABLET | Freq: Every day | ORAL | Status: DC
  Administered 2023-08-08 – 2023-08-12 (×5): 75 mg via ORAL
  Filled 2023-08-08 (×5): qty 1

## 2023-08-08 MED ORDER — EZETIMIBE 10 MG PO TABS
10.0000 mg | ORAL_TABLET | Freq: Every day | ORAL | Status: DC
  Administered 2023-08-08 – 2023-08-12 (×5): 10 mg via ORAL
  Filled 2023-08-08 (×5): qty 1

## 2023-08-08 MED ORDER — EZETIMIBE 10 MG PO TABS
10.0000 mg | ORAL_TABLET | Freq: Every day | ORAL | 0 refills | Status: DC
  Filled 2023-08-08: qty 30, 30d supply, fill #0

## 2023-08-08 MED ORDER — ROSUVASTATIN CALCIUM 20 MG PO TABS: 20.0000 mg | ORAL_TABLET | Freq: Every day | ORAL | Status: DC

## 2023-08-08 MED ORDER — LOSARTAN POTASSIUM 25 MG PO TABS: 12.5000 mg | ORAL_TABLET | Freq: Every day | ORAL | Status: DC

## 2023-08-08 MED ORDER — ASPIRIN 81 MG PO TBEC
81.0000 mg | DELAYED_RELEASE_TABLET | Freq: Every day | ORAL | 2 refills | Status: DC
  Filled 2023-08-08: qty 30, 30d supply, fill #0

## 2023-08-08 MED ORDER — ROSUVASTATIN CALCIUM 20 MG PO TABS
40.0000 mg | ORAL_TABLET | Freq: Every day | ORAL | Status: DC
  Administered 2023-08-08 – 2023-08-11 (×4): 40 mg via ORAL
  Filled 2023-08-08: qty 2
  Filled 2023-08-08: qty 8
  Filled 2023-08-08 (×2): qty 2

## 2023-08-08 MED ORDER — CLOPIDOGREL BISULFATE 75 MG PO TABS
75.0000 mg | ORAL_TABLET | Freq: Every day | ORAL | 0 refills | Status: DC
  Filled 2023-08-08: qty 30, 30d supply, fill #0

## 2023-08-08 NOTE — Discharge Summary (Addendum)
[name] ZOX:[id number] DOB: 1941/09/05 DOA: 08/06/2023  PCP: [name], MD  Admit date: 08/06/2023  Discharge date: 08/08/2023  Admitted From: Home   Disposition:  Home   Recommendations for Outpatient Follow-up:   Follow up with PCP in 1-2 weeks  PCP Please obtain BMP/CBC, 2 view CXR in 1week,  (see Discharge instructions)   PCP Please follow up on the following pending results:    Home Health: PT, OT   Equipment/Devices: walker  Consultations: Neuro Discharge Condition: Stable    CODE STATUS: Full    Diet Recommendation: Heart Healthy     Chief Complaint  Patient presents with   Weakness     Brief history of present illness from the day of admission and additional interim summary    82 y.o. female with medical history significant of HTN, T2DM, HLD, anxiety, GERD, recurrent syncope s/p loop recorder who presented with intermittent LUE weakness and paresthesia.  As per patient for past 3 to 4 months she noticed intermittent weakness and numbness of left arm from shoulder to wrist.  On arrival to ED blood pressure was high at 197/62.  MRI brain showed multiple small acute infarcts of the right cerebral hemisphere.  She reports remote history of GI bleed following taking DVT prophylaxis for knee replacement.                                                                  Hospital Course   CVA -MRI brain showed multiple small acute infarcts in the right cerebral hemisphere -CT head and neck showed 70% stenosis of the proximal right ICA with mixed density atherosclerosis, 6.4 mm.  Projecting aneurysm of the left ICA.  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 Her left sided deficits improving, home health PT, OT and walk with close outpatient follow-up with PCP, neurology and vascular surgery.     Hypertension -Blood pressure is mild elevated for permissive hypertension -Losartan resumed 2 days from now to allow for permissive hypertension   Depression -Continue sertraline   Closed nondisplaced fracture of the fifth metatarsal bone of the left foot -Remains immobilized in cam boot, HHPT, Walker -Follows podiatry as outpatient   GERD remote history of GI bleed related to NSAID use. -Continue pantoprazole, monitor CBC intermittently by PCP.    Discharge diagnosis     Principal Problem:   CVA (cerebral vascular accident) [facility]) Active Problems:   Essential hypertension   Depression   GERD   Closed nondisplaced fracture of fifth metatarsal bone of left foot   Stroke (HCC)  [name] ZOX:[id number] DOB: 1941/09/05 DOA: 08/06/2023  PCP: [name], MD  Admit date: 08/06/2023  Discharge date: 08/08/2023  Admitted From: Home   Disposition:  Home   Recommendations for Outpatient Follow-up:   Follow up with PCP in 1-2 weeks  PCP Please obtain BMP/CBC, 2 view CXR in 1week,  (see Discharge instructions)   PCP Please follow up on the following pending results:    Home Health: PT, OT   Equipment/Devices: walker  Consultations: Neuro Discharge Condition: Stable    CODE STATUS: Full    Diet Recommendation: Heart Healthy     Chief Complaint  Patient presents with   Weakness     Brief history of present illness from the day of admission and additional interim summary    82 y.o. female with medical history significant of HTN, T2DM, HLD, anxiety, GERD, recurrent syncope s/p loop recorder who presented with intermittent LUE weakness and paresthesia.  As per patient for past 3 to 4 months she noticed intermittent weakness and numbness of left arm from shoulder to wrist.  On arrival to ED blood pressure was high at 197/62.  MRI brain showed multiple small acute infarcts of the right cerebral hemisphere.  She reports remote history of GI bleed following taking DVT prophylaxis for knee replacement.                                                                  Hospital Course   CVA -MRI brain showed multiple small acute infarcts in the right cerebral hemisphere -CT head and neck showed 70% stenosis of the proximal right ICA with mixed density atherosclerosis, 6.4 mm.  Projecting aneurysm of the left ICA.  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 Her left sided deficits improving, home health PT, OT and walk with close outpatient follow-up with PCP, neurology and vascular surgery.     Hypertension -Blood pressure is mild elevated for permissive hypertension -Losartan resumed 2 days from now to allow for permissive hypertension   Depression -Continue sertraline   Closed nondisplaced fracture of the fifth metatarsal bone of the left foot -Remains immobilized in cam boot, HHPT, Walker -Follows podiatry as outpatient   GERD remote history of GI bleed related to NSAID use. -Continue pantoprazole, monitor CBC intermittently by PCP.    Discharge diagnosis     Principal Problem:   CVA (cerebral vascular accident) [facility]) Active Problems:   Essential hypertension   Depression   GERD   Closed nondisplaced fracture of fifth metatarsal bone of left foot   Stroke (HCC)  [name] [id number] DOB: 1941/09/05 DOA: 08/06/2023  PCP: [name], MD  Admit date: 08/06/2023  Discharge date: 08/08/2023  Admitted From: Home   Disposition:  Home   Recommendations for Outpatient Follow-up:   Follow up with PCP in 1-2 weeks  PCP Please obtain BMP/CBC, 2 view CXR in 1week,  (see Discharge instructions)   PCP Please follow up on the following pending results:    Home Health: PT, OT   Equipment/Devices: walker  Consultations: Neuro Discharge Condition: Stable    CODE STATUS: Full    Diet Recommendation: Heart Healthy     Chief Complaint  Patient presents with   Weakness     Brief history of present illness from the day of admission and additional interim summary    82 y.o. female with medical history significant of HTN, T2DM, HLD, anxiety, GERD, recurrent syncope s/p loop recorder who presented with intermittent LUE weakness and paresthesia.  As per patient for past 3 to 4 months she noticed intermittent weakness and numbness of left arm from shoulder to wrist.  On arrival to ED blood pressure was high at 197/62.  MRI brain showed multiple small acute infarcts of the right cerebral hemisphere.  She reports remote history of GI bleed following taking DVT prophylaxis for knee replacement.                                                                  Hospital Course   CVA -MRI brain showed multiple small acute infarcts in the right cerebral hemisphere -CT head and neck showed 70% stenosis of the proximal right ICA with mixed density atherosclerosis, 6.4 mm.  Projecting aneurysm of the left ICA.  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 Her left sided deficits improving, home health PT, OT and walk with close outpatient follow-up with PCP, neurology and vascular surgery.     Hypertension -Blood pressure is mild elevated for permissive hypertension -Losartan resumed 2 days from now to allow for permissive hypertension   Depression -Continue sertraline   Closed nondisplaced fracture of the fifth metatarsal bone of the left foot -Remains immobilized in cam boot, HHPT, Walker -Follows podiatry as outpatient   GERD remote history of GI bleed related to NSAID use. -Continue pantoprazole, monitor CBC intermittently by PCP.    Discharge diagnosis     Principal Problem:   CVA (cerebral vascular accident) [facility]) Active Problems:   Essential hypertension   Depression   GERD   Closed nondisplaced fracture of fifth metatarsal bone of left foot   Stroke (HCC)  hydrocortisone 25 MG suppository Commonly known as: ANUSOL-HC Place 1 suppository (25 mg total) rectally at bedtime.   ketoconazole 2 % cream Commonly known as: NIZORAL Apply 1 Application topically daily.   latanoprost 0.005 % ophthalmic solution Commonly known as: XALATAN Place 1 drop into the right eye at bedtime.   losartan 25 MG tablet Commonly known as: COZAAR Take 0.5 tablets (12.5 mg total) by mouth daily. Start taking on: August 10, 2023 What changed: These instructions start on August 10, 2023. If you are unsure what to do until then, ask your doctor or other care provider.   metFORMIN 500 MG tablet Commonly known as: GLUCOPHAGE Take 500 mg by mouth every morning.   montelukast 10 MG tablet Commonly known as: SINGULAIR Take 10 mg by mouth at bedtime.   nystatin ointment Commonly known as: MYCOSTATIN Apply 1 application  topically See admin instructions. 1 application 1-2 times a day as needed for rash   rosuvastatin 40 MG tablet Commonly known as: CRESTOR Take 40 mg by mouth at bedtime.   sertraline 100 MG tablet Commonly known as: ZOLOFT Take 100 mg by mouth every morning.   sucralfate 1 g tablet Commonly known as: Carafate Take 1 tablet (1 g total) by mouth 2 (two) times daily as needed. Do not take within 2 hours of any other medications.   SYSTANE OP Place 1 drop into both eyes daily as needed (dry/irritated eyes).   Vitamin D3 250 MCG (10000 UT) capsule Take 10,000 Units by mouth daily.   zinc sulfate 220 (50 Zn) MG capsule Take 1 capsule (220 mg total) by mouth daily.               Durable Medical  Equipment  (From admission, onward)           Start     Ordered   08/08/23 0936  DME Dan Humphreys  Once       Question Answer Comment  Walker: With 5 Inch Wheels   Patient needs a walker to treat with the following condition CVA (cerebral vascular accident) (HCC)      08/08/23 0936             Follow-up Information     [name], MD. Schedule an appointment as soon as possible for a visit in 1 week(s).   Specialty: Family Medicine Contact information: [address] [place] [address] [phone]         Micki Riley, MD. Schedule an appointment as soon as possible for a visit in 1 month(s).   Specialties: Neurology, Radiology Contact information: [address] [place] [address] [phone]         Victorino Sparrow, MD. Schedule an appointment as soon as possible for a visit in 2 week(s).   Specialty: Vascular Surgery Why: Carotid artery stenosis Contact information: [address] [place] [address] [phone]                 Major procedures and Radiology Reports - PLEASE review detailed and final reports thoroughly  -     ECHOCARDIOGRAM COMPLETE  Result Date: 08/07/2023    ECHOCARDIOGRAM REPORT   Patient Name:   [name] Date of Exam: 08/07/2023 Medical Rec #:  [id number]   Height:       71.0 in Accession #:    [id number]  Weight:       215.0 lb Date of Birth:  Jan 27, 1941  BSA:  [name] ZOX:[id number] DOB: 1941/09/05 DOA: 08/06/2023  PCP: [name], MD  Admit date: 08/06/2023  Discharge date: 08/08/2023  Admitted From: Home   Disposition:  Home   Recommendations for Outpatient Follow-up:   Follow up with PCP in 1-2 weeks  PCP Please obtain BMP/CBC, 2 view CXR in 1week,  (see Discharge instructions)   PCP Please follow up on the following pending results:    Home Health: PT, OT   Equipment/Devices: walker  Consultations: Neuro Discharge Condition: Stable    CODE STATUS: Full    Diet Recommendation: Heart Healthy     Chief Complaint  Patient presents with   Weakness     Brief history of present illness from the day of admission and additional interim summary    82 y.o. female with medical history significant of HTN, T2DM, HLD, anxiety, GERD, recurrent syncope s/p loop recorder who presented with intermittent LUE weakness and paresthesia.  As per patient for past 3 to 4 months she noticed intermittent weakness and numbness of left arm from shoulder to wrist.  On arrival to ED blood pressure was high at 197/62.  MRI brain showed multiple small acute infarcts of the right cerebral hemisphere.  She reports remote history of GI bleed following taking DVT prophylaxis for knee replacement.                                                                  Hospital Course   CVA -MRI brain showed multiple small acute infarcts in the right cerebral hemisphere -CT head and neck showed 70% stenosis of the proximal right ICA with mixed density atherosclerosis, 6.4 mm.  Projecting aneurysm of the left ICA.  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 Her left sided deficits improving, home health PT, OT and walk with close outpatient follow-up with PCP, neurology and vascular surgery.     Hypertension -Blood pressure is mild elevated for permissive hypertension -Losartan resumed 2 days from now to allow for permissive hypertension   Depression -Continue sertraline   Closed nondisplaced fracture of the fifth metatarsal bone of the left foot -Remains immobilized in cam boot, HHPT, Walker -Follows podiatry as outpatient   GERD remote history of GI bleed related to NSAID use. -Continue pantoprazole, monitor CBC intermittently by PCP.    Discharge diagnosis     Principal Problem:   CVA (cerebral vascular accident) [facility]) Active Problems:   Essential hypertension   Depression   GERD   Closed nondisplaced fracture of fifth metatarsal bone of left foot   Stroke (HCC)  [name] [id number] DOB: 1941/09/05 DOA: 08/06/2023  PCP: [name], MD  Admit date: 08/06/2023  Discharge date: 08/08/2023  Admitted From: Home   Disposition:  Home   Recommendations for Outpatient Follow-up:   Follow up with PCP in 1-2 weeks  PCP Please obtain BMP/CBC, 2 view CXR in 1week,  (see Discharge instructions)   PCP Please follow up on the following pending results:    Home Health: PT, OT   Equipment/Devices: walker  Consultations: Neuro Discharge Condition: Stable    CODE STATUS: Full    Diet Recommendation: Heart Healthy     Chief Complaint  Patient presents with   Weakness     Brief history of present illness from the day of admission and additional interim summary    82 y.o. female with medical history significant of HTN, T2DM, HLD, anxiety, GERD, recurrent syncope s/p loop recorder who presented with intermittent LUE weakness and paresthesia.  As per patient for past 3 to 4 months she noticed intermittent weakness and numbness of left arm from shoulder to wrist.  On arrival to ED blood pressure was high at 197/62.  MRI brain showed multiple small acute infarcts of the right cerebral hemisphere.  She reports remote history of GI bleed following taking DVT prophylaxis for knee replacement.                                                                  Hospital Course   CVA -MRI brain showed multiple small acute infarcts in the right cerebral hemisphere -CT head and neck showed 70% stenosis of the proximal right ICA with mixed density atherosclerosis, 6.4 mm.  Projecting aneurysm of the left ICA.  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 Her left sided deficits improving, home health PT, OT and walk with close outpatient follow-up with PCP, neurology and vascular surgery.     Hypertension -Blood pressure is mild elevated for permissive hypertension -Losartan resumed 2 days from now to allow for permissive hypertension   Depression -Continue sertraline   Closed nondisplaced fracture of the fifth metatarsal bone of the left foot -Remains immobilized in cam boot, HHPT, Walker -Follows podiatry as outpatient   GERD remote history of GI bleed related to NSAID use. -Continue pantoprazole, monitor CBC intermittently by PCP.    Discharge diagnosis     Principal Problem:   CVA (cerebral vascular accident) [facility]) Active Problems:   Essential hypertension   Depression   GERD   Closed nondisplaced fracture of fifth metatarsal bone of left foot   Stroke (HCC)  [name] [id number] DOB: 1941/09/05 DOA: 08/06/2023  PCP: [name], MD  Admit date: 08/06/2023  Discharge date: 08/08/2023  Admitted From: Home   Disposition:  Home   Recommendations for Outpatient Follow-up:   Follow up with PCP in 1-2 weeks  PCP Please obtain BMP/CBC, 2 view CXR in 1week,  (see Discharge instructions)   PCP Please follow up on the following pending results:    Home Health: PT, OT   Equipment/Devices: walker  Consultations: Neuro Discharge Condition: Stable    CODE STATUS: Full    Diet Recommendation: Heart Healthy     Chief Complaint  Patient presents with   Weakness     Brief history of present illness from the day of admission and additional interim summary    82 y.o. female with medical history significant of HTN, T2DM, HLD, anxiety, GERD, recurrent syncope s/p loop recorder who presented with intermittent LUE weakness and paresthesia.  As per patient for past 3 to 4 months she noticed intermittent weakness and numbness of left arm from shoulder to wrist.  On arrival to ED blood pressure was high at 197/62.  MRI brain showed multiple small acute infarcts of the right cerebral hemisphere.  She reports remote history of GI bleed following taking DVT prophylaxis for knee replacement.                                                                  Hospital Course   CVA -MRI brain showed multiple small acute infarcts in the right cerebral hemisphere -CT head and neck showed 70% stenosis of the proximal right ICA with mixed density atherosclerosis, 6.4 mm.  Projecting aneurysm of the left ICA.  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 Her left sided deficits improving, home health PT, OT and walk with close outpatient follow-up with PCP, neurology and vascular surgery.     Hypertension -Blood pressure is mild elevated for permissive hypertension -Losartan resumed 2 days from now to allow for permissive hypertension   Depression -Continue sertraline   Closed nondisplaced fracture of the fifth metatarsal bone of the left foot -Remains immobilized in cam boot, HHPT, Walker -Follows podiatry as outpatient   GERD remote history of GI bleed related to NSAID use. -Continue pantoprazole, monitor CBC intermittently by PCP.    Discharge diagnosis     Principal Problem:   CVA (cerebral vascular accident) [facility]) Active Problems:   Essential hypertension   Depression   GERD   Closed nondisplaced fracture of fifth metatarsal bone of left foot   Stroke (HCC)  [name] [id number] DOB: 1941/09/05 DOA: 08/06/2023  PCP: [name], MD  Admit date: 08/06/2023  Discharge date: 08/08/2023  Admitted From: Home   Disposition:  Home   Recommendations for Outpatient Follow-up:   Follow up with PCP in 1-2 weeks  PCP Please obtain BMP/CBC, 2 view CXR in 1week,  (see Discharge instructions)   PCP Please follow up on the following pending results:    Home Health: PT, OT   Equipment/Devices: walker  Consultations: Neuro Discharge Condition: Stable    CODE STATUS: Full    Diet Recommendation: Heart Healthy     Chief Complaint  Patient presents with   Weakness     Brief history of present illness from the day of admission and additional interim summary    82 y.o. female with medical history significant of HTN, T2DM, HLD, anxiety, GERD, recurrent syncope s/p loop recorder who presented with intermittent LUE weakness and paresthesia.  As per patient for past 3 to 4 months she noticed intermittent weakness and numbness of left arm from shoulder to wrist.  On arrival to ED blood pressure was high at 197/62.  MRI brain showed multiple small acute infarcts of the right cerebral hemisphere.  She reports remote history of GI bleed following taking DVT prophylaxis for knee replacement.                                                                  Hospital Course   CVA -MRI brain showed multiple small acute infarcts in the right cerebral hemisphere -CT head and neck showed 70% stenosis of the proximal right ICA with mixed density atherosclerosis, 6.4 mm.  Projecting aneurysm of the left ICA.  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 Her left sided deficits improving, home health PT, OT and walk with close outpatient follow-up with PCP, neurology and vascular surgery.     Hypertension -Blood pressure is mild elevated for permissive hypertension -Losartan resumed 2 days from now to allow for permissive hypertension   Depression -Continue sertraline   Closed nondisplaced fracture of the fifth metatarsal bone of the left foot -Remains immobilized in cam boot, HHPT, Walker -Follows podiatry as outpatient   GERD remote history of GI bleed related to NSAID use. -Continue pantoprazole, monitor CBC intermittently by PCP.    Discharge diagnosis     Principal Problem:   CVA (cerebral vascular accident) [facility]) Active Problems:   Essential hypertension   Depression   GERD   Closed nondisplaced fracture of fifth metatarsal bone of left foot   Stroke (HCC)

## 2023-08-08 NOTE — Discharge Instructions (Addendum)
Follow with Primary MD [name], MD in 7 days   Get CBC, CMP, 2 view Chest X ray -  checked next visit with your primary MD    Activity: As tolerated with Full fall precautions use walker/cane & assistance as needed, wear the cam boot provided for your fractured foot as before follow-up with your orthopedic surgeon as before.  Disposition Home    Diet: Heart Healthy   Special Instructions: If you have smoked or chewed Tobacco  in the last 2 yrs please stop smoking, stop any regular Alcohol  and or any Recreational drug use.  On your next visit with your primary care physician please Get Medicines reviewed and adjusted.  Please request your Prim.MD to go over all Hospital Tests and Procedure/Radiological results at the follow up, please get all Hospital records sent to your Prim MD by signing hospital release before you go home.  If you experience worsening of your admission symptoms, develop shortness of breath, life threatening emergency, suicidal or homicidal thoughts you must seek medical attention immediately by calling 911 or calling your MD immediately  if symptoms less severe.  You Must read complete instructions/literature along with all the possible adverse reactions/side effects for all the Medicines you take and that have been prescribed to you. Take any new Medicines after you have completely understood and accpet all the possible adverse reactions/side effects.   Do not drive when taking Pain medications.  Do not take more than prescribed Pain, Sleep and Anxiety Medications         [facility]  Discharge Instructions   Carotid Surgery  Please refer to the following instructions for your post-procedure care. Your surgeon or physician assistant will discuss any changes with you.  Activity  You are encouraged to walk as much as you can. You can slowly return to normal activities but must avoid strenuous activity and heavy  lifting until your doctor tell you it's okay. Avoid activities such as vacuuming or swinging a golf club. You can drive after one week if you are comfortable and you are no longer taking prescription pain medications. It is normal to feel tired for serval weeks after your surgery. It is also normal to have difficulty with sleep habits, eating, and bowel movements after surgery. These will go away with time.  Bathing/Showering  Shower daily after you go home. Do not soak in a bathtub, hot tub, or swim until the incision heals completely.  Incision Care  Remove the bandages from your incisions on 08/11/2023. Shower every day. Clean your incision with mild soap and water. Pat the area dry with a clean towel. You do not need a bandage unless otherwise instructed. Do not apply any ointments or creams to your incision. You may have skin glue or steri strips on your incision. Do not peel it off. It will come off on its own in about one week. Your incision may feel thickened and raised for several weeks after your surgery. This is normal and the skin will soften over time.   Diet  Resume your normal diet. There are no special food restrictions following this procedure. A low fat/low cholesterol diet is recommended for all patients with vascular disease. In order to heal from your surgery, it is CRITICAL to get adequate nutrition. Your body requires vitamins, minerals, and protein. Vegetables are the best source of vitamins and minerals. Vegetables also provide the perfect balance of protein. Processed food has little nutritional value,  so try to avoid this.  Medications  Resume taking all of your medications unless your doctor or physician assistant tells you not to. If your incision is causing pain, you may take over-the- counter pain relievers such as acetaminophen (Tylenol). If you were prescribed a stronger pain medication, please be aware these medications can cause nausea and constipation. Prevent  nausea by taking the medication with a snack or meal. Avoid constipation by drinking plenty of fluids and eating foods with a high amount of fiber, such as fruits, vegetables, and grains.  Do not take Tylenol if you are taking prescription pain medications.  Follow Up  Our office will schedule a follow up appointment 4 weeks following discharge.  Please call us immediately for any of the following conditions  Increased pain, redness, drainage (pus) from your incision site. Fever of 101 degrees or higher. If you should develop stroke (slurred speech, difficulty swallowing, weakness on one side of your body, loss of vision) you should call 911 and go to the nearest emergency room.  Reduce your risk of vascular disease:  Stop smoking. If you would like help call [facility] at 1-800-QUIT-NOW (812-209-4303) or [place] at [phone]. Manage your cholesterol Maintain a desired weight Control your diabetes Keep your blood pressure down  If you have any questions, please call the office at [phone].

## 2023-08-08 NOTE — Progress Notes (Signed)
Physical Therapy Treatment Patient Details Name: [name] MRN: [id number] DOB: April 30, 1941 Today's Date: 08/08/2023   History of Present Illness [name] is a 82 y.o. female admitted 10/22 who presents with intermittent LUE weakness and numbness. Pt with recent L foot fx after a syncopal episode on 10/15 at home.  Yesterday on 10/22, she stood up abruptly felt left upper extremity weakness and noticed hemianopsia along with left-sided facial numbness and numbness/tingling in her left arm. She had MRI brain without contrast and MR cervical spine which showed acute small infarcts in the right cerebral hemisphere including the frontal and parietal lobes.  CT angiogram showed 70% stenosis of the right ICA consistent with right MCA territory infarcts.  Severe stenosis right vertebral artery V1 segment and occlusion of the V4. 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 Vascular surgery consulted and scheduled R carotid revascularization for 10/25. Plan for right TSA on 09/06/23. PMH: HTN, DM2, HLD    PT Comments  Pt received in supine, agreeable to therapy session and with good participation and tolerance for transfer and gait training with RW. Pt with improved standing and activity tolerance this date, and reports only mild pain with LLE CAM boot donned for gait trial, pain arguably worse at her R shoulder than her foot. Pt needing cues for step sequencing for decreased pain/improved stability and demos fair balance when using RW. Plan to bring rollator next session and assess whether she is safer with RW or 4WW for home DME as well as trial stairs. Pt continues to benefit from PT services to progress toward functional mobility goals.     If plan is discharge home, recommend the following: A little help with walking and/or transfers;A little help with bathing/dressing/bathroom;Assistance with cooking/housework;Help with stairs or ramp for entrance;Assist for transportation   Can travel by  private vehicle        Equipment Recommendations  None recommended by PT (she has rollator but may need two wheeled walker due to L foot fx, TBD)    Recommendations for Other Services       Precautions / Restrictions Precautions Precautions: Fall Precaution Comments: Goal BP less than 180/105 per neuro MD Restrictions Weight Bearing Restrictions: Yes LLE Weight Bearing: Weight bearing as tolerated Other Position/Activity Restrictions: with CAM boot donned     Mobility  Bed Mobility Overal bed mobility: Independent                  Transfers Overall transfer level: Needs assistance Equipment used: Rolling walker (2 wheels) Transfers: Sit to/from Stand Sit to Stand: Contact guard assist           General transfer comment: cues for safe UE placement when using RW. Pt with R shoulder injury so recommended she keep RUE on RW handle and push from bed with LUE    Ambulation/Gait Ambulation/Gait assistance: Contact guard assist, Min assist Gait Distance (Feet): 20 Feet (x2 in room) Assistive device: Rolling walker (2 wheels) Gait Pattern/deviations: Step-to pattern, Decreased weight shift to left       General Gait Details: Intermittent cues for RW management with fair carryover, occasional physical assist for RW placement while turning/in narrow spaces of room as pt tends to get hips outside RW when turning. Distance limited due to need for toileting then lunch arriving, pt agreeable to sit up in chair to eat.   Stairs Stairs:  (not yet attempted, pt wanting to eat lunch which just arrived)  Wheelchair Mobility     Tilt Bed    Modified Rankin (Stroke Patients Only) Modified Rankin (Stroke Patients Only) Pre-Morbid Rankin Score: No significant disability Modified Rankin: Moderately severe disability     Balance Overall balance assessment: Needs assistance Sitting-balance support: No upper extremity supported, Feet supported Sitting  balance-Leahy Scale: Good     Standing balance support: Bilateral upper extremity supported Standing balance-Leahy Scale: Fair Standing balance comment: Fair with RW for static/dynamic tasks, CGA for unsupported standing during peri care due to L foot fx                            Cognition Arousal: Alert Behavior During Therapy: WFL for tasks assessed/performed Overall Cognitive Status: Within Functional Limits for tasks assessed                                          Exercises Other Exercises Other Exercises: reviewed supine/seated BLE AROM: hip/knee flex/ext and seated LAQ/hip flexion as pain allows TID.    General Comments General comments (skin integrity, edema, etc.): HR 60's bpm resting, SBP 150's sitting EOB and no c/o dizziness with transfers/prolonged standing      Pertinent Vitals/Pain Pain Assessment Pain Assessment: 0-10 Pain Score: 3  Pain Location: left foot worse at dorsal surface Pain Descriptors / Indicators: Discomfort, Sore Pain Intervention(s): Monitored during session, Repositioned, Ice applied    Home Living                          Prior Function            PT Goals (current goals can now be found in the care plan section) Acute Rehab PT Goals Patient Stated Goal: to go home PT Goal Formulation: With patient Time For Goal Achievement: 08/21/23 Progress towards PT goals: Progressing toward goals    Frequency    Min 1X/week      PT Plan      Co-evaluation              AM-PAC PT "6 Clicks" Mobility   Outcome Measure  Help needed turning from your back to your side while in a flat bed without using bedrails?: None Help needed moving from lying on your back to sitting on the side of a flat bed without using bedrails?: None Help needed moving to and from a bed to a chair (including a wheelchair)?: A Little Help needed standing up from a chair using your arms (e.g., wheelchair or bedside  chair)?: A Little Help needed to walk in hospital room?: A Little Help needed climbing 3-5 steps with a railing? : A Lot 6 Click Score: 19    End of Session Equipment Utilized During Treatment: Gait belt Activity Tolerance: Patient tolerated treatment well Patient left: in chair;with call bell/phone within reach;Other (comment) (LLE elevated, ice for pain mgmt, pt aware to remove ice pack after ~20 mins or if numb; no chair alarm in room, RN notified, pt able to notify staff with call bell when ready to get up, A&O) Nurse Communication: Mobility status PT Visit Diagnosis: Unsteadiness on feet (R26.81);Muscle weakness (generalized) (M62.81)     Time: 0960-4540 PT Time Calculation (min) (ACUTE ONLY): 36 min  Charges:    $Gait Training: 8-22 mins $Therapeutic Activity: 8-22 mins PT General Charges $$ ACUTE PT VISIT:  1 Visit                     Florina Ou., PTA Acute Rehabilitation Services Secure Chat Preferred 9a-5:30pm Office: [phone]    Dorathy Kinsman [facility] 08/08/2023, 4:28 PM

## 2023-08-08 NOTE — Progress Notes (Addendum)
STROKE TEAM PROGRESS NOTE   BRIEF HPI [name] is a 82 y.o. female with history of HTN, DM2, HLD presenting with recurrent transient LUE weakness and numbness. She reported that she had a syncopal episode on 10/15 in which she was going to the bathroom and woke up minutes later with extreme foot pain.  She reported going to her PCP who recommended she had an x-ray which revealed a break in one of the metatarsal bones of her left foot.  Yesterday on 10/22, she stood up abruptly felt left upper extremity weakness and noticed hemianopsia along with left-sided facial numbness and numbness/tingling in her left arm.  After talking with her family she decided come to the hospital.  She had MRI brain without contrast and MR cervical spine which showed acute small infarcts in the right cerebral hemisphere including the frontal and parietal lobes.  CT angiogram showed 70% stenosis of the right ICA consistent with right MCA territory infarcts.  Severe stenosis right vertebral artery V1 segment and occlusion of the V4.  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  SIGNIFICANT HOSPITAL EVENTS 10/22: admitted to the ED MRI brain, MR cervical spine 10/23: CT angio head neck 10/24: Vascular surgery consult.  INTERIM HISTORY/SUBJECTIVE Met patient in her room on 5W with Dr Pearlean Brownie and NP [name]. Pt was alert and said that she was still having some left-sided weakness and blurriness of vision on the left (which is an improvement since yesterday). Pt has not had any new symptoms. No nausea/vomiting, no vision changes, no chest pain. No new motor deficits, improvements in motor symptoms.   OBJECTIVE  CBC    Component Value Date/Time   WBC 4.8 08/08/2023 0619   RBC 4.15 08/08/2023 0619   HGB 12.7 08/08/2023 0619   HGB 12.9 11/16/2021 0930   HGB 12.7 01/08/2017 1224   HCT 38.3 08/08/2023 0619   HCT 38.2 11/16/2021 0930   HCT 39.3 01/08/2017 1224   PLT 166 08/08/2023 0619   PLT 148 (L)  11/16/2021 0930   MCV 92.3 08/08/2023 0619   MCV 94 11/16/2021 0930   MCV 95.2 01/08/2017 1224   MCH 30.6 08/08/2023 0619   MCHC 33.2 08/08/2023 0619   RDW 12.9 08/08/2023 0619   RDW 13.1 11/16/2021 0930   RDW 12.9 01/08/2017 1224   LYMPHSABS 2.0 08/06/2023 1726   LYMPHSABS 2.3 01/08/2017 1224   MONOABS 0.4 08/06/2023 1726   MONOABS 0.8 01/08/2017 1224   EOSABS 0.4 08/06/2023 1726   EOSABS 0.2 01/08/2017 1224   BASOSABS 0.0 08/06/2023 1726   BASOSABS 0.0 01/08/2017 1224    BMET    Component Value Date/Time   NA 137 08/08/2023 0619   NA 141 08/05/2023 1157   NA 141 01/08/2017 1224   K 3.9 08/08/2023 0619   K 5.3 (H) 01/08/2017 1224   CL 105 08/08/2023 0619   CL 105 10/24/2012 1407   CO2 23 08/08/2023 0619   CO2 32 (H) 01/08/2017 1224   GLUCOSE 114 (H) 08/08/2023 0619   GLUCOSE 81 01/08/2017 1224   GLUCOSE 87 10/24/2012 1407   BUN 16 08/08/2023 0619   BUN 18 08/05/2023 1157   BUN 16.6 01/08/2017 1224   CREATININE 1.02 (H) 08/08/2023 0619   CREATININE 1.0 01/08/2017 1224   CALCIUM 9.1 08/08/2023 0619   CALCIUM 10.4 01/08/2017 1224   EGFR 64 08/05/2023 1157   GFRNONAA 56 (L) 08/08/2023 0619    IMAGING past 24 hours No results found.  Vitals:   08/07/23 2200 08/07/23 2326 08/08/23 0513 08/08/23 0930  BP:  (!) 166/64 (!) 164/57 (!) 168/73  Pulse:  67 64 (!) 58  Resp:  14 16 16   Temp:  97.6 F (36.4 C) 98.1 F (36.7 C) 98.3 F (36.8 C)  TempSrc:  Oral Oral Oral  SpO2:  95% 96% 96%  Weight: 102.9 kg     Height:       PHYSICAL EXAM General: Alert, well-nourished, well-developed patient in no acute distress Psych: Mood and affect appropriate for situation CV: Regular rate and rhythm on monitor Respiratory:  Regular, unlabored respirations on room air GI: Abdomen soft and nontender  NEURO:  Mental Status: AA&Ox3, patient is able to give clear and coherent history Speech/Language: speech is without dysarthria or aphasia.  Naming, repetition, fluency, and  comprehension intact.  Cranial Nerves:  II: PERRL. Visual field deficits no longer present on L side. Pt reports blurry vision, but was able to accurately report digits. III, IV, VI: EOMI. Eyelids elevate symmetrically.  V: Sensory deficit on L has reduced. Closer to symmetric. VII: Mild left nasolabial fold flattening. VIII: Hearing diminished on left side.  IX, X: Palate elevates symmetrically. Phonation is normal.  MW:NUUVOZDG shrug on right limited by R shoulder injury. 5/5 on L XII: tongue is midline without fasciculations. Motor: 5/5 strength to all muscle groups tested bilaterally. Tone: is normal and bulk is normal Sensation- Intact to light touch bilaterally. Extinction absent to light touch to DSS.   Coordination: Left-sided difficulties in FTN still present. Mild LUE drift.  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  CTA head & neck A 70% stenosis of the proximal right ICA due to mixed density atherosclerosis. This is a likely source of the previously demonstrated right MCA territory embolic infarcts.  Vascular surgery plans to schedule R carotid revascularization 10/25 MRI  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  2D Echo EF 65-70%, LA moderately dilated; RA mildly dilated LDL 112 HgbA1c 6.8 VTE prophylaxis - Lovenox 40 mg subcutaneous  No antithrombotic prior to admission, now on aspirin 81 mg daily and clopidogrel 75 mg daily for 3 weeks, then plavix alone due to patient intolerance of long-term aspirin therapy.  (Patient has hx of GI bleeds with daily aspirin) Therapy recommendations:  Pending Disposition: TBD Pt has a cardiac arrhythmia and experiences sensations of heart palpitations on a regular basis.  Cardiac Loop recorder last transmitted data on 10/20, and showed no abnormal rhythms  consistent with her syncopal episode on 10/15.  Hx of Stroke/TIA NA  Hypertension Home meds:  losartan Stable, hold for permissive htn (hydralazine as emergent antihypertensive) Blood Pressure Goal: BP less than 180/105   Hyperlipidemia Home meds:  rosuvastatin 40, resumed in hospital LDL 112, goal < 70 Add ezetimibe 10 mg Continue statin and ezetimibe at discharge  Diabetes type II Controlled Home meds:  metformin HgbA1c 6.8, goal < 7.0 CBGs SSI  Other Stroke Risk Factors Family hx heart disease (mother), Fam hx stroke (brother)   Other Active Problems, managed per primary Fracture, Left 5th metatarsal Depression - continue sertraline GERD - Continue pantoprazole    Hospital day # 2  Patient presented with left hemiparesis secondary to symptomatic moderate right carotid stenosis and right brain watershed infarcts.  Recommend aspirin and Plavix and referred to vascular surgery for elective right  carotid revascularization which is planned for tomorrow..  Continue  aggressive risk factor modification.  Long discussion with patient and answered questions.  Discussed with Dr. Sharl Ma.  Greater than 50% time during this 35-minute visit was spent on counseling and coordination of care about his symptomatic carotid stenosis and discussion about treatment options and need for revascularization and answering questions.  Delia Heady, MD Medical Director [facility] Pager: [phone] 08/08/2023 2:03 PM   To contact Stroke Continuity provider, please refer to [email or web address]. After hours, contact General Neurology

## 2023-08-08 NOTE — Telephone Encounter (Addendum)
Called patient regarding results. Patient had understanding of results. Patient currently admitted to hospital.----- Message from Joni Reining sent at 08/07/2023 11:57 AM EDT ----- I have reviewed the labs. No significant abnormalities. I also reviewed the ILR, there were no pauses or arrhythmias which may have caused her to pass out.  Awaiting MRI results. If negative will refer to neurology.

## 2023-08-08 NOTE — Consult Note (Addendum)
[facility]   ASSESSMENT / PLAN: [name] is a 82 y.o. 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   The patient's carotid artery stenosis merits consideration of revascularization to reduce the risk of future stroke because of carotid stenosis >50% with symptoms of TIA/CVA attributable to carotid lesion. I quoted the patient risk reduction from intervention of ~17% for symptomatic stenosis based on data from the NASCET trial.  Recommend:  Abstinence from all tobacco products. Blood glucose control with goal A1c < 7%. Blood pressure control with goal blood pressure < 140/90 mmHg. Lipid reduction therapy with goal LDL-C <100 mg/dL  Aspirin 81mg  PO QD.  Clopidogrel 75mg  PO QD. Atorvastatin 40-80mg  PO QD (or other "high intensity" statin therapy).  I have completed Share Decision Making with [name] prior to surgery.  Conversations included: -Discussion of all treatment options including carotid endarterectomy (CEA), CAS (which includes transcarotid artery revascularization (TCAR)), and optimal medical therapy (OMT)). -Explanation of risks and benefits for each option specific to [name]'s clinical situation. -Integration of clinical guidelines as it relates to the patient's history and co-morbidities -Discussion and incorporation of [name] and their personal preferences and priorities in choosing a treatment plan.  CT angiogram shows anatomy favorable for TCAR. Will arrange for R TCAR tomorrow.  CHIEF COMPLAINT: left sided weakness  HISTORY OF PRESENT ILLNESS: [name] is a 82 y.o. female admitted to the hospital with left-sided weakness.  Stroke workup was initiated.  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  She reports crescendo events prior to her presenting weakness.  I reviewed her angiographic findings with her in detail.  We reviewed  the 2 main modes of carotid artery revascularization.  I ultimately recommended TCAR given the anatomic challenges with her carotid artery stenosis.   Past Medical History:  Diagnosis Date   Allergy    Anal or rectal pain    sometimes   Anemia    hx of during pregnancy   Anxiety    Arthritis    Asthma    hx of   Bradycardia    " I KNOW I HAVE BRADYCARDIA ESPECIALLY WHEN I SLEEP"    Cataract 2021   bilateral eyes   Chronic back pain    Degenerative joint disease    osteo   Depression    Diabetes mellitus without complication (HCC)    DM type II   Diverticulosis 2003   Dysrhythmia    hx of  due to eye drop and also low heart rate 40's per pt. Dr. Allyson Sabal follows   Elevated total protein    Esophageal dysmotility    Fibromyalgia    GERD (gastroesophageal reflux disease)    subsequent Nissen Fundoplication   Glaucoma    Hearing loss    Heart murmur    "was told she had a heart murmur"   Hemorrhoids    Hiatal hernia 11/08/2009   Hx of adenomatous colonic polyps 07/02/2002   Hypercalcemia    Hyperlipidemia    Hyperlipidemia    Hypertension    Implantable loop recorder present 11/28/2021   Nausea    Osteoporosis    PONV (postoperative nausea and vomiting)    Rectal bleeding    from hemorrhoids.     Sleep apnea    DOES USE CPAP    Thrombocytopenia (HCC)    Varicose veins of left lower extremity  Past Surgical History:  Procedure Laterality Date   CARDIAC CATHETERIZATION  03/14/1992   Normal cardiac cath. Normal LV function.   CARDIOVASCULAR STRESS TEST  01/22/2011   No scintigraphic evidence of inducible ischemia.   CAROTID DOPPLER  03/31/2007   Bilateral ICAs - no evidence of significant diameter reduction, dissectin, tortuosity, FMD, or any other vascular abnormality.   CATARACT EXTRACTION, BILATERAL     ESOPHAGEAL MANOMETRY N/A 11/14/2015   Procedure: ESOPHAGEAL MANOMETRY (EM);  Surgeon: Napoleon Form, MD;  Location: [facility];  Service: Endoscopy;   Laterality: N/A;   ESOPHAGEAL MANOMETRY N/A 01/18/2021   Procedure: ESOPHAGEAL MANOMETRY (EM);  Surgeon: Napoleon Form, MD;  Location: [facility];  Service: Endoscopy;  Laterality: N/A;   ESOPHAGOGASTRODUODENOSCOPY N/A 03/08/2021   Procedure: ESOPHAGOGASTRODUODENOSCOPY (EGD);  Surgeon: Corliss Skains, MD;  Location[facility] OR;  Service: Thoracic;  Laterality: N/A;   EYE SURGERY     bilateral cataracts; bilateral stents   GASTRIC RESECTION  2009   GLAUCOMA SURGERY     JOINT REPLACEMENT     right knee Dr. Lequita Halt 06-23-18   KNEE SURGERY Bilateral    NISSEN FUNDOPLICATION  2000   with subsequent takedown in 2009   TOTAL KNEE ARTHROPLASTY Left 06/10/2017   Procedure: LEFT  TOTAL KNEE ARTHROPLASTY;  Surgeon: Ollen Gross, MD;  Location: [facility];  Service: Orthopedics;  Laterality: Left;  Adductor Block   TOTAL KNEE ARTHROPLASTY Right 06/23/2018   Procedure: RIGHT TOTAL KNEE ARTHROPLASTY;  Surgeon: Ollen Gross, MD;  Location: [facility];  Service: Orthopedics;  Laterality: Right;   TRANSTHORACIC ECHOCARDIOGRAM  12/21/2010   EF 60%, moderate LVH,    TUBAL LIGATION     XI ROBOTIC ASSISTED HIATAL HERNIA REPAIR N/A 03/08/2021   Procedure: XI ROBOTIC ASSISTED LAPAROSCOPY WITH LYSIS OF ADHESIONS;  Surgeon: Corliss Skains, MD;  Location: [facility] OR;  Service: Thoracic;  Laterality: N/A;    Family History  Problem Relation Age of Onset   Heart disease Mother    Hypertension Mother    Diabetes Mother    Diabetes Father    Kidney disease Father    Stroke Brother    Cancer Maternal Grandmother    Kidney disease Maternal Grandfather    Breast cancer Daughter    Stomach cancer Maternal Aunt    Uterine cancer Maternal Aunt    Leukemia Maternal Uncle    Prostate cancer Maternal Uncle    Colon cancer Neg Hx    Rectal cancer Neg Hx    Esophageal cancer Neg Hx     Social History   Socioeconomic History   Marital status: Married    Spouse name: Not on file   Number of children: 8   Years of  education: Not on file   Highest education level: Not on file  Occupational History   Occupation: retired    Comment: retired Clinical biochemist.   Tobacco Use   Smoking status: Never   Smokeless tobacco: Never  Vaping Use   Vaping status: Never Used  Substance and Sexual Activity   Alcohol use: No    Alcohol/week: 0.0 standard drinks of alcohol   Drug use: No   Sexual activity: Yes  Other Topics Concern   Not on file  Social History Narrative   Husband, Makeshia Lyness is Next of Kin. Cell # [phone]   Social Determinants of Health   Financial Resource Strain: Not on file  Food Insecurity: No Food Insecurity (08/07/2023)   Hunger Vital Sign  Worried About Programme researcher, broadcasting/film/video in the Last Year: Never true    Ran Out of Food in the Last Year: Never true  Transportation Needs: No Transportation Needs (08/07/2023)   PRAPARE - Administrator, Civil Service (Medical): No    Lack of Transportation (Non-Medical): No  Physical Activity: Not on file  Stress: Not on file  Social Connections: Not on file  Intimate Partner Violence: Not At Risk (08/07/2023)   Humiliation, Afraid, Rape, and Kick questionnaire    Fear of Current or Ex-Partner: No    Emotionally Abused: No    Physically Abused: No    Sexually Abused: No    Allergies  Allergen Reactions   Adalat [Nifedipine] Other (See Comments)    Tired, Made pt "feel crazy in the head"   Hydrocodone Itching and Nausea And Vomiting   Meperidine Hcl Other (See Comments)    Demerol causes hallucinations   Naproxen Nausea And Vomiting    Current Facility-Administered Medications  Medication Dose Route Frequency Provider Last Rate Last Admin   acetaminophen (TYLENOL) tablet 650 mg  650 mg Oral Q4H PRN Tu, Ching T, DO   650 mg at 08/08/23 2130   Or   acetaminophen (TYLENOL) 160 MG/5ML solution 650 mg  650 mg Per Tube Q4H PRN Tu, Ching T, DO       Or   acetaminophen (TYLENOL) suppository 650 mg  650 mg Rectal Q4H PRN Tu,  Ching T, DO       ascorbic acid (VITAMIN C) tablet 500 mg  500 mg Oral Daily Tu, Ching T, DO   500 mg at 08/08/23 0932   aspirin EC tablet 81 mg  81 mg Oral Daily Erick Blinks, MD   81 mg at 08/08/23 0932   brimonidine (ALPHAGAN) 0.2 % ophthalmic solution 1 drop  1 drop Both Eyes BID Tu, Ching T, DO   1 drop at 08/08/23 0933   cholecalciferol (VITAMIN D3) 25 MCG (1000 UNIT) tablet 10,000 Units  10,000 Units Oral Daily Tu, Ching T, DO   10,000 Units at 08/08/23 0932   clopidogrel (PLAVIX) tablet 75 mg  75 mg Oral Daily Leroy Sea, MD       enoxaparin (LOVENOX) injection 40 mg  40 mg Subcutaneous Q24H Tu, Ching T, DO   40 mg at 08/08/23 0932   ezetimibe (ZETIA) tablet 10 mg  10 mg Oral Daily Leroy Sea, MD   10 mg at 08/08/23 0932   hydrALAZINE (APRESOLINE) injection 5 mg  5 mg Intravenous Q4H PRN Tu, Ching T, DO       latanoprost (XALATAN) 0.005 % ophthalmic solution 1 drop  1 drop Right Eye QHS Tu, Ching T, DO   1 drop at 08/07/23 2323   loratadine (CLARITIN) tablet 10 mg  10 mg Oral Daily Tu, Ching T, DO   10 mg at 08/08/23 0932   montelukast (SINGULAIR) tablet 10 mg  10 mg Oral QHS Tu, Ching T, DO   10 mg at 08/07/23 2322   Oral care mouth rinse  15 mL Mouth Rinse PRN Elgergawy, Leana Roe, MD       pantoprazole (PROTONIX) EC tablet 40 mg  40 mg Oral Daily Tu, Ching T, DO   40 mg at 08/08/23 0932   rosuvastatin (CRESTOR) tablet 40 mg  40 mg Oral QHS Leroy Sea, MD       senna-docusate (Senokot-S) tablet 1 tablet  1 tablet Oral QHS PRN Benita Gutter T, DO  sertraline (ZOLOFT) tablet 100 mg  100 mg Oral q morning Tu, Ching T, DO   100 mg at 08/08/23 0932   sucralfate (CARAFATE) tablet 1 g  1 g Oral BID PRN Tu, Ching T, DO       zinc sulfate capsule 220 mg  220 mg Oral Daily Tu, Ching T, DO   220 mg at 08/08/23 0932    PHYSICAL EXAM Vitals:   08/07/23 2200 08/07/23 2326 08/08/23 0513 08/08/23 0930  BP:  (!) 166/64 (!) 164/57 (!) 168/73  Pulse:  67 64 (!) 58  Resp:   14 16 16   Temp:  97.6 F (36.4 C) 98.1 F (36.7 C) 98.3 F (36.8 C)  TempSrc:  Oral Oral Oral  SpO2:  95% 96% 96%  Weight: 102.9 kg     Height:       Elderly woman.  Appears younger than stated age. Palpable radial pulses bilaterally Mild left-sided weakness especially about the hand No cranial nerve abnormalities   PERTINENT LABORATORY AND RADIOLOGIC DATA  Most recent CBC    Latest Ref Rng & Units 08/08/2023    6:19 AM 08/06/2023    5:38 PM 08/06/2023    5:26 PM  CBC  WBC 4.0 - 10.5 K/uL 4.8   5.1   Hemoglobin 12.0 - 15.0 g/dL 30.8  65.7  84.6   Hematocrit 36.0 - 46.0 % 38.3  44.0  42.9   Platelets 150 - 400 K/uL 166   179      Most recent CMP    Latest Ref Rng & Units 08/08/2023    6:19 AM 08/06/2023    5:38 PM 08/06/2023    5:26 PM  CMP  Glucose 70 - 99 mg/dL 962  952  841   BUN 8 - 23 mg/dL 16  17  15    Creatinine 0.44 - 1.00 mg/dL 3.24  4.01  0.27   Sodium 135 - 145 mmol/L 137  140  138   Potassium 3.5 - 5.1 mmol/L 3.9  4.3  4.4   Chloride 98 - 111 mmol/L 105  104  105   CO2 22 - 32 mmol/L 23   26   Calcium 8.9 - 10.3 mg/dL 9.1   9.3   Total Protein 6.5 - 8.1 g/dL   6.7   Total Bilirubin 0.3 - 1.2 mg/dL   0.7   Alkaline Phos 38 - 126 U/L   69   AST 15 - 41 U/L   33   ALT 0 - 44 U/L   20     Renal function Estimated Creatinine Clearance: 57.6 mL/min (A) (by C-G formula based on SCr of 1.02 mg/dL (H)).  Hgb A1c MFr Bld (%)  Date Value  08/07/2023 6.8 (H)    LDL Chol Calc (NIH)  Date Value Ref Range Status  10/26/2020 115 (H) 0 - 99 mg/dL Final   LDL Cholesterol  Date Value Ref Range Status  08/07/2023 112 (H) 0 - 99 mg/dL Final    Comment:           Total Cholesterol/HDL:CHD Risk Coronary Heart Disease Risk Table                     Men   Women  1/2 Average Risk   3.4   3.3  Average Risk       5.0   4.4  2 X Average Risk   9.6   7.1  3 X Average Risk  23.4  11.0        Use the calculated Patient Ratio above and the CHD Risk Table to  determine the patient's CHD Risk.        ATP III CLASSIFICATION (LDL):  <100     mg/dL   Optimal  981-191  mg/dL   Near or Above                    Optimal  130-159  mg/dL   Borderline  478-295  mg/dL   High  >621     mg/dL   Very High Performed at [facility], [address], [place], [place] [address]     MRI brain shows multifocal right cerebral hemispheric acute infarcts  CT angiogram shows significant right internal carotid artery stenosis.  Plaque extends to the level of C2.  No common carotid or external carotid artery stenosis.  Anatomy most favorable for [employer] Lenell Antu, MD FACS [facility] Phone Number: [phone] 08/08/2023 10:59 AM   Total time spent on preparing this encounter including chart review, data review, collecting history, examining the patient, coordinating care for this new patient, 60 minutes.  Portions of this report may have been transcribed using voice recognition software.  Every effort has been made to ensure accuracy; however, inadvertent computerized transcription errors may still be present.

## 2023-08-08 NOTE — Plan of Care (Signed)
  Problem: Education: Goal: Knowledge of disease or condition will improve Outcome: Progressing   Problem: Coping: Goal: Will verbalize positive feelings about self Outcome: Progressing   Problem: Education: Goal: Knowledge of General Education information will improve Description: Including pain rating scale, medication(s)/side effects and non-pharmacologic comfort measures Outcome: Progressing   Problem: Clinical Measurements: Goal: Ability to maintain clinical measurements within normal limits will improve Outcome: Progressing   Problem: Activity: Goal: Risk for activity intolerance will decrease Outcome: Progressing   Problem: Pain Management: Goal: General experience of comfort will improve Outcome: Progressing   Problem: Safety: Goal: Ability to remain free from injury will improve Outcome: Progressing

## 2023-08-08 NOTE — Telephone Encounter (Addendum)
Called patient regarding results. Left message for patient to call office.----- Message from Joni Reining sent at 08/07/2023 11:57 AM EDT ----- I have reviewed the labs. No significant abnormalities. I also reviewed the ILR, there were no pauses or arrhythmias which may have caused her to pass out.  Awaiting MRI results. If negative will refer to neurology.

## 2023-08-08 NOTE — Evaluation (Signed)
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) 05/22/2022   AKI (acute kidney injury) (HCC) 05/21/2022   Hiatal hernia 03/08/2021   Heartburn    Ineffective esophageal motility    Dyspnea on exertion 07/24/2020   Change in bowel habits 08/22/2018   Knee stiff 07/23/2018   Degeneration of lumbar intervertebral disc 02/04/2018   Scoliosis of lumbar spine 02/04/2018   Sinus bradycardia 07/27/2016   Cough    Essential hypertension 11/24/2014   Gonalgia 05/13/2013   OA (osteoarthritis) of knee 05/13/2013   Degenerative joint disease involving multiple joints 01/19/2013   Rheumatoid arthritis (HCC) 01/19/2013   Thyroid nodule 06/11/2012   Elevated total protein    Cyst of thyroid 10/29/2011   Hemorrhoids, internal 05/28/2011   Acid reflux 02/14/2011   Avitaminosis D 02/13/2011   Glaucoma 11/28/2010   HLD (hyperlipidemia) 12/14/2009   Internal and external bleeding hemorrhoids 12/14/2009   DIVERTICULOSIS, COLON 12/14/2009   Joint pain 12/14/2009   BACK PAIN, CHRONIC 12/14/2009   FIBROMYALGIA 12/14/2009   OSTEOPOROSIS 12/14/2009   Sleep apnea 12/14/2009   History of colonic polyps 12/14/2009   GASTRIC POLYP, HX OF 12/14/2009   Diaphragmatic hernia 10/04/2009   Depression 09/29/2009   REFLUX ESOPHAGITIS 09/29/2009   GERD 09/29/2009   Constipation 09/29/2009   DYSPHAGIA 09/29/2009   Past Medical History:  Past Medical History:  Diagnosis Date   Allergy     Anal or rectal pain    sometimes   Anemia    hx of during pregnancy   Anxiety    Arthritis    Asthma    hx of   Bradycardia    " I KNOW I HAVE BRADYCARDIA ESPECIALLY WHEN I SLEEP"    Cataract 2021   bilateral eyes   Chronic back pain    Degenerative joint disease    osteo   Depression    Diabetes mellitus without complication (HCC)    DM type II   Diverticulosis 2003   Dysrhythmia    hx of  due to eye drop and also low heart rate 40's per pt. Dr. Allyson Sabal follows   Elevated total protein    Esophageal dysmotility    Fibromyalgia    GERD (gastroesophageal reflux disease)    subsequent Nissen Fundoplication   Glaucoma    Hearing loss    Heart murmur    "was told she had a heart murmur"   Hemorrhoids    Hiatal hernia 11/08/2009   Hx of adenomatous colonic polyps 07/02/2002   Hypercalcemia    Hyperlipidemia    Hyperlipidemia    Hypertension    Implantable loop recorder present 11/28/2021   Nausea    Osteoporosis    PONV (postoperative nausea and vomiting)    Rectal bleeding    from hemorrhoids.     Sleep apnea    DOES USE CPAP    Thrombocytopenia (HCC)    Varicose veins of left lower extremity    Past Surgical History:  Past Surgical History:  Procedure Laterality Date  CARDIAC CATHETERIZATION  03/14/1992   Normal cardiac cath. Normal LV function.   CARDIOVASCULAR STRESS TEST  01/22/2011   No scintigraphic evidence of inducible ischemia.   CAROTID DOPPLER  03/31/2007   Bilateral ICAs - no evidence of significant diameter reduction, dissectin, tortuosity, FMD, or any other vascular abnormality.   CATARACT EXTRACTION, BILATERAL     ESOPHAGEAL MANOMETRY N/A 11/14/2015   Procedure: ESOPHAGEAL MANOMETRY (EM);  Surgeon: Napoleon Form, MD;  Location: [facility];  Service: Endoscopy;  Laterality: N/A;   ESOPHAGEAL MANOMETRY N/A 01/18/2021   Procedure: ESOPHAGEAL MANOMETRY (EM);  Surgeon: Napoleon Form, MD;  Location: [facility];  Service: Endoscopy;  Laterality:  N/A;   ESOPHAGOGASTRODUODENOSCOPY N/A 03/08/2021   Procedure: ESOPHAGOGASTRODUODENOSCOPY (EGD);  Surgeon: Corliss Skains, MD;  Location: [facility] OR;  Service: Thoracic;  Laterality: N/A;   EYE SURGERY     bilateral cataracts; bilateral stents   GASTRIC RESECTION  2009   GLAUCOMA SURGERY     JOINT REPLACEMENT     right knee Dr. Lequita Halt 06-23-18   KNEE SURGERY Bilateral    NISSEN FUNDOPLICATION  2000   with subsequent takedown in 2009   TOTAL KNEE ARTHROPLASTY Left 06/10/2017   Procedure: LEFT  TOTAL KNEE ARTHROPLASTY;  Surgeon: Ollen Gross, MD;  Location: [facility];  Service: Orthopedics;  Laterality: Left;  Adductor Block   TOTAL KNEE ARTHROPLASTY Right 06/23/2018   Procedure: RIGHT TOTAL KNEE ARTHROPLASTY;  Surgeon: Ollen Gross, MD;  Location: [facility];  Service: Orthopedics;  Laterality: Right;   TRANSTHORACIC ECHOCARDIOGRAM  12/21/2010   EF 60%, moderate LVH,    TUBAL LIGATION     XI ROBOTIC ASSISTED HIATAL HERNIA REPAIR N/A 03/08/2021   Procedure: XI ROBOTIC ASSISTED LAPAROSCOPY WITH LYSIS OF ADHESIONS;  Surgeon: Corliss Skains, MD;  Location: [facility] OR;  Service: Thoracic;  Laterality: N/A;   HPI:  Patient is an 82 y.o. female with PMH: HTN, DM-2, HLD. She presented to the hospital on 08/06/23 with interittent LUE weakness and numbness. MRI brain showed scattered watershed infarct in the right cerebral hemisphere. UA and UDS were both negative.   Assessment / Plan / Recommendation Clinical Impression  Patient seen by SLP for cognitive-linguistic evaluation. Patient was pleasant and alert throughout the evaluation. Her son and daughter-in-law reported no overt changes in speech or cognition. Patient reported history of mild age-related memory deficits prior to this hospitalization. SLP administered the [facility] Mental Status examination (SLUMS) to measure cognitive-linguistic function. Patient presents with no overt changes in cognitive status and preformed WFL, scoring a  27/30. Memory presented with minor deficit, congruent with reported baseline deficit. On the spaced retrival task, patient recalled 3/5 objects. Patient is functioning at baseline. No further acute ST is indicated at this time.    SLP Assessment  SLP Recommendation/Assessment: Patient does not need any further Speech Lanaguage Pathology Services SLP Visit Diagnosis: Cognitive communication deficit (R41.841)    Recommendations for follow up therapy are one component of a multi-disciplinary discharge planning process, led by the attending physician.  Recommendations may be updated based on patient status, additional functional criteria and insurance authorization.    Follow Up Recommendations  No SLP follow up    Assistance Recommended at Discharge  None  Functional Status Assessment Patient has not had a recent decline in their functional status  Frequency and Duration           SLP Evaluation Cognition  Overall Cognitive Status: Within Functional Limits for tasks assessed Arousal/Alertness: Awake/alert Orientation  Level: Oriented X4 Memory: Appears intact Awareness: Appears intact Problem Solving: Appears intact       Comprehension  Auditory Comprehension Overall Auditory Comprehension: Appears within functional limits for tasks assessed Yes/No Questions: Within Functional Limits Commands: Not tested Conversation: Complex Visual Recognition/Discrimination Discrimination: Within Function Limits Reading Comprehension Reading Status: Not tested    Expression Expression Primary Mode of Expression: Verbal Verbal Expression Overall Verbal Expression: Appears within functional limits for tasks assessed Initiation: No impairment Level of Generative/Spontaneous Verbalization: Conversation Naming: Not tested Pragmatics: No impairment   Oral / Motor  Oral Motor/Sensory Function Overall Oral Motor/Sensory Function: Within functional limits Motor Speech Overall Motor Speech:  Appears within functional limits for tasks assessed Respiration: Within functional limits Phonation: Normal Resonance: Within functional limits Articulation: Within functional limitis Intelligibility: Intelligible Motor Planning: Witnin functional limits Motor Speech Errors: Not applicable            Marline Backbone, Senaida Lange., Speech Therapy Student   08/08/2023, 4:41 PM

## 2023-08-08 NOTE — H&P (View-Only) (Signed)
[facility]   ASSESSMENT / PLAN: [name] is a 82 y.o. 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   The patient's carotid artery stenosis merits consideration of revascularization to reduce the risk of future stroke because of carotid stenosis >50% with symptoms of TIA/CVA attributable to carotid lesion. I quoted the patient risk reduction from intervention of ~17% for symptomatic stenosis based on data from the NASCET trial.  Recommend:  Abstinence from all tobacco products. Blood glucose control with goal A1c < 7%. Blood pressure control with goal blood pressure < 140/90 mmHg. Lipid reduction therapy with goal LDL-C <100 mg/dL  Aspirin 81mg  PO QD.  Clopidogrel 75mg  PO QD. Atorvastatin 40-80mg  PO QD (or other "high intensity" statin therapy).  I have completed Share Decision Making with [name] prior to surgery.  Conversations included: -Discussion of all treatment options including carotid endarterectomy (CEA), CAS (which includes transcarotid artery revascularization (TCAR)), and optimal medical therapy (OMT)). -Explanation of risks and benefits for each option specific to [name]'s clinical situation. -Integration of clinical guidelines as it relates to the patient's history and co-morbidities -Discussion and incorporation of [name] and their personal preferences and priorities in choosing a treatment plan.  CT angiogram shows anatomy favorable for TCAR. Will arrange for R TCAR tomorrow.  CHIEF COMPLAINT: left sided weakness  HISTORY OF PRESENT ILLNESS: [name] is a 82 y.o. female admitted to the hospital with left-sided weakness.  Stroke workup was initiated.  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  She reports crescendo events prior to her presenting weakness.  I reviewed her angiographic findings with her in detail.  We reviewed  the 2 main modes of carotid artery revascularization.  I ultimately recommended TCAR given the anatomic challenges with her carotid artery stenosis.   Past Medical History:  Diagnosis Date   Allergy    Anal or rectal pain    sometimes   Anemia    hx of during pregnancy   Anxiety    Arthritis    Asthma    hx of   Bradycardia    " I KNOW I HAVE BRADYCARDIA ESPECIALLY WHEN I SLEEP"    Cataract 2021   bilateral eyes   Chronic back pain    Degenerative joint disease    osteo   Depression    Diabetes mellitus without complication (HCC)    DM type II   Diverticulosis 2003   Dysrhythmia    hx of  due to eye drop and also low heart rate 40's per pt. Dr. Allyson Sabal follows   Elevated total protein    Esophageal dysmotility    Fibromyalgia    GERD (gastroesophageal reflux disease)    subsequent Nissen Fundoplication   Glaucoma    Hearing loss    Heart murmur    "was told she had a heart murmur"   Hemorrhoids    Hiatal hernia 11/08/2009   Hx of adenomatous colonic polyps 07/02/2002   Hypercalcemia    Hyperlipidemia    Hyperlipidemia    Hypertension    Implantable loop recorder present 11/28/2021   Nausea    Osteoporosis    PONV (postoperative nausea and vomiting)    Rectal bleeding    from hemorrhoids.     Sleep apnea    DOES USE CPAP    Thrombocytopenia (HCC)    Varicose veins of left lower extremity  Past Surgical History:  Procedure Laterality Date   CARDIAC CATHETERIZATION  03/14/1992   Normal cardiac cath. Normal LV function.   CARDIOVASCULAR STRESS TEST  01/22/2011   No scintigraphic evidence of inducible ischemia.   CAROTID DOPPLER  03/31/2007   Bilateral ICAs - no evidence of significant diameter reduction, dissectin, tortuosity, FMD, or any other vascular abnormality.   CATARACT EXTRACTION, BILATERAL     ESOPHAGEAL MANOMETRY N/A 11/14/2015   Procedure: ESOPHAGEAL MANOMETRY (EM);  Surgeon: Napoleon Form, MD;  Location: [facility];  Service: Endoscopy;   Laterality: N/A;   ESOPHAGEAL MANOMETRY N/A 01/18/2021   Procedure: ESOPHAGEAL MANOMETRY (EM);  Surgeon: Napoleon Form, MD;  Location: [facility];  Service: Endoscopy;  Laterality: N/A;   ESOPHAGOGASTRODUODENOSCOPY N/A 03/08/2021   Procedure: ESOPHAGOGASTRODUODENOSCOPY (EGD);  Surgeon: Corliss Skains, MD;  Location[facility] OR;  Service: Thoracic;  Laterality: N/A;   EYE SURGERY     bilateral cataracts; bilateral stents   GASTRIC RESECTION  2009   GLAUCOMA SURGERY     JOINT REPLACEMENT     right knee Dr. Lequita Halt 06-23-18   KNEE SURGERY Bilateral    NISSEN FUNDOPLICATION  2000   with subsequent takedown in 2009   TOTAL KNEE ARTHROPLASTY Left 06/10/2017   Procedure: LEFT  TOTAL KNEE ARTHROPLASTY;  Surgeon: Ollen Gross, MD;  Location: [facility];  Service: Orthopedics;  Laterality: Left;  Adductor Block   TOTAL KNEE ARTHROPLASTY Right 06/23/2018   Procedure: RIGHT TOTAL KNEE ARTHROPLASTY;  Surgeon: Ollen Gross, MD;  Location: [facility];  Service: Orthopedics;  Laterality: Right;   TRANSTHORACIC ECHOCARDIOGRAM  12/21/2010   EF 60%, moderate LVH,    TUBAL LIGATION     XI ROBOTIC ASSISTED HIATAL HERNIA REPAIR N/A 03/08/2021   Procedure: XI ROBOTIC ASSISTED LAPAROSCOPY WITH LYSIS OF ADHESIONS;  Surgeon: Corliss Skains, MD;  Location: [facility] OR;  Service: Thoracic;  Laterality: N/A;    Family History  Problem Relation Age of Onset   Heart disease Mother    Hypertension Mother    Diabetes Mother    Diabetes Father    Kidney disease Father    Stroke Brother    Cancer Maternal Grandmother    Kidney disease Maternal Grandfather    Breast cancer Daughter    Stomach cancer Maternal Aunt    Uterine cancer Maternal Aunt    Leukemia Maternal Uncle    Prostate cancer Maternal Uncle    Colon cancer Neg Hx    Rectal cancer Neg Hx    Esophageal cancer Neg Hx     Social History   Socioeconomic History   Marital status: Married    Spouse name: Not on file   Number of children: 8   Years of  education: Not on file   Highest education level: Not on file  Occupational History   Occupation: retired    Comment: retired Clinical biochemist.   Tobacco Use   Smoking status: Never   Smokeless tobacco: Never  Vaping Use   Vaping status: Never Used  Substance and Sexual Activity   Alcohol use: No    Alcohol/week: 0.0 standard drinks of alcohol   Drug use: No   Sexual activity: Yes  Other Topics Concern   Not on file  Social History Narrative   Husband, Makeshia Lyness is Next of Kin. Cell # [phone]   Social Determinants of Health   Financial Resource Strain: Not on file  Food Insecurity: No Food Insecurity (08/07/2023)   Hunger Vital Sign  Worried About Programme researcher, broadcasting/film/video in the Last Year: Never true    Ran Out of Food in the Last Year: Never true  Transportation Needs: No Transportation Needs (08/07/2023)   PRAPARE - Administrator, Civil Service (Medical): No    Lack of Transportation (Non-Medical): No  Physical Activity: Not on file  Stress: Not on file  Social Connections: Not on file  Intimate Partner Violence: Not At Risk (08/07/2023)   Humiliation, Afraid, Rape, and Kick questionnaire    Fear of Current or Ex-Partner: No    Emotionally Abused: No    Physically Abused: No    Sexually Abused: No    Allergies  Allergen Reactions   Adalat [Nifedipine] Other (See Comments)    Tired, Made pt "feel crazy in the head"   Hydrocodone Itching and Nausea And Vomiting   Meperidine Hcl Other (See Comments)    Demerol causes hallucinations   Naproxen Nausea And Vomiting    Current Facility-Administered Medications  Medication Dose Route Frequency Provider Last Rate Last Admin   acetaminophen (TYLENOL) tablet 650 mg  650 mg Oral Q4H PRN Tu, Ching T, DO   650 mg at 08/08/23 2130   Or   acetaminophen (TYLENOL) 160 MG/5ML solution 650 mg  650 mg Per Tube Q4H PRN Tu, Ching T, DO       Or   acetaminophen (TYLENOL) suppository 650 mg  650 mg Rectal Q4H PRN Tu,  Ching T, DO       ascorbic acid (VITAMIN C) tablet 500 mg  500 mg Oral Daily Tu, Ching T, DO   500 mg at 08/08/23 0932   aspirin EC tablet 81 mg  81 mg Oral Daily Erick Blinks, MD   81 mg at 08/08/23 0932   brimonidine (ALPHAGAN) 0.2 % ophthalmic solution 1 drop  1 drop Both Eyes BID Tu, Ching T, DO   1 drop at 08/08/23 0933   cholecalciferol (VITAMIN D3) 25 MCG (1000 UNIT) tablet 10,000 Units  10,000 Units Oral Daily Tu, Ching T, DO   10,000 Units at 08/08/23 0932   clopidogrel (PLAVIX) tablet 75 mg  75 mg Oral Daily Leroy Sea, MD       enoxaparin (LOVENOX) injection 40 mg  40 mg Subcutaneous Q24H Tu, Ching T, DO   40 mg at 08/08/23 0932   ezetimibe (ZETIA) tablet 10 mg  10 mg Oral Daily Leroy Sea, MD   10 mg at 08/08/23 0932   hydrALAZINE (APRESOLINE) injection 5 mg  5 mg Intravenous Q4H PRN Tu, Ching T, DO       latanoprost (XALATAN) 0.005 % ophthalmic solution 1 drop  1 drop Right Eye QHS Tu, Ching T, DO   1 drop at 08/07/23 2323   loratadine (CLARITIN) tablet 10 mg  10 mg Oral Daily Tu, Ching T, DO   10 mg at 08/08/23 0932   montelukast (SINGULAIR) tablet 10 mg  10 mg Oral QHS Tu, Ching T, DO   10 mg at 08/07/23 2322   Oral care mouth rinse  15 mL Mouth Rinse PRN Elgergawy, Leana Roe, MD       pantoprazole (PROTONIX) EC tablet 40 mg  40 mg Oral Daily Tu, Ching T, DO   40 mg at 08/08/23 0932   rosuvastatin (CRESTOR) tablet 40 mg  40 mg Oral QHS Leroy Sea, MD       senna-docusate (Senokot-S) tablet 1 tablet  1 tablet Oral QHS PRN Benita Gutter T, DO  sertraline (ZOLOFT) tablet 100 mg  100 mg Oral q morning Tu, Ching T, DO   100 mg at 08/08/23 0932   sucralfate (CARAFATE) tablet 1 g  1 g Oral BID PRN Tu, Ching T, DO       zinc sulfate capsule 220 mg  220 mg Oral Daily Tu, Ching T, DO   220 mg at 08/08/23 0932    PHYSICAL EXAM Vitals:   08/07/23 2200 08/07/23 2326 08/08/23 0513 08/08/23 0930  BP:  (!) 166/64 (!) 164/57 (!) 168/73  Pulse:  67 64 (!) 58  Resp:   14 16 16   Temp:  97.6 F (36.4 C) 98.1 F (36.7 C) 98.3 F (36.8 C)  TempSrc:  Oral Oral Oral  SpO2:  95% 96% 96%  Weight: 102.9 kg     Height:       Elderly woman.  Appears younger than stated age. Palpable radial pulses bilaterally Mild left-sided weakness especially about the hand No cranial nerve abnormalities   PERTINENT LABORATORY AND RADIOLOGIC DATA  Most recent CBC    Latest Ref Rng & Units 08/08/2023    6:19 AM 08/06/2023    5:38 PM 08/06/2023    5:26 PM  CBC  WBC 4.0 - 10.5 K/uL 4.8   5.1   Hemoglobin 12.0 - 15.0 g/dL 30.8  65.7  84.6   Hematocrit 36.0 - 46.0 % 38.3  44.0  42.9   Platelets 150 - 400 K/uL 166   179      Most recent CMP    Latest Ref Rng & Units 08/08/2023    6:19 AM 08/06/2023    5:38 PM 08/06/2023    5:26 PM  CMP  Glucose 70 - 99 mg/dL 962  952  841   BUN 8 - 23 mg/dL 16  17  15    Creatinine 0.44 - 1.00 mg/dL 3.24  4.01  0.27   Sodium 135 - 145 mmol/L 137  140  138   Potassium 3.5 - 5.1 mmol/L 3.9  4.3  4.4   Chloride 98 - 111 mmol/L 105  104  105   CO2 22 - 32 mmol/L 23   26   Calcium 8.9 - 10.3 mg/dL 9.1   9.3   Total Protein 6.5 - 8.1 g/dL   6.7   Total Bilirubin 0.3 - 1.2 mg/dL   0.7   Alkaline Phos 38 - 126 U/L   69   AST 15 - 41 U/L   33   ALT 0 - 44 U/L   20     Renal function Estimated Creatinine Clearance: 57.6 mL/min (A) (by C-G formula based on SCr of 1.02 mg/dL (H)).  Hgb A1c MFr Bld (%)  Date Value  08/07/2023 6.8 (H)    LDL Chol Calc (NIH)  Date Value Ref Range Status  10/26/2020 115 (H) 0 - 99 mg/dL Final   LDL Cholesterol  Date Value Ref Range Status  08/07/2023 112 (H) 0 - 99 mg/dL Final    Comment:           Total Cholesterol/HDL:CHD Risk Coronary Heart Disease Risk Table                     Men   Women  1/2 Average Risk   3.4   3.3  Average Risk       5.0   4.4  2 X Average Risk   9.6   7.1  3 X Average Risk  23.4  11.0        Use the calculated Patient Ratio above and the CHD Risk Table to  determine the patient's CHD Risk.        ATP III CLASSIFICATION (LDL):  <100     mg/dL   Optimal  981-191  mg/dL   Near or Above                    Optimal  130-159  mg/dL   Borderline  478-295  mg/dL   High  >621     mg/dL   Very High Performed at [facility], [address], [place], [place] [address]     MRI brain shows multifocal right cerebral hemispheric acute infarcts  CT angiogram shows significant right internal carotid artery stenosis.  Plaque extends to the level of C2.  No common carotid or external carotid artery stenosis.  Anatomy most favorable for [employer] Lenell Antu, MD FACS [facility] Phone Number: [phone] 08/08/2023 10:59 AM   Total time spent on preparing this encounter including chart review, data review, collecting history, examining the patient, coordinating care for this new patient, 60 minutes.  Portions of this report may have been transcribed using voice recognition software.  Every effort has been made to ensure accuracy; however, inadvertent computerized transcription errors may still be present.

## 2023-08-08 NOTE — TOC Transition Note (Signed)
Transition of Care [facility]) - CM/SW Discharge Note   Patient Details  Name: [name] MRN: [id number] Date of Birth: 04-17-1941  [facility] CM/SW Contact:  Gordy Clement, RN Phone Number: 08/08/2023, 10:17 AM   Clinical Narrative:     Patient will DC to home today.  No DME needed [facility] PT[facility]OT will be provided by [facility].  No DME is needed- Patient has BSC and RW.  Husband or Daughter to transport     No additional TOC needs           Patient Goals and CMS Choice      Discharge Placement                         Discharge Plan and Services Additional resources added to the After Visit Summary for                                       Social Determinants of Health (SDOH) Interventions SDOH Screenings   Food Insecurity: No Food Insecurity (08/07/2023)  Housing: Low Risk  (08/07/2023)  Transportation Needs: No Transportation Needs (08/07/2023)  Utilities: Not At Risk (08/07/2023)  Tobacco Use: Low Risk  (08/07/2023)     Readmission Risk Interventions     No data to display

## 2023-08-09 ENCOUNTER — Encounter (HOSPITAL_COMMUNITY)
Admission: EM | Disposition: A | Discharge status: Home / Self Care | Payer: Self-pay | Source: Home / Self Care | Attending: Internal Medicine

## 2023-08-09 ENCOUNTER — Inpatient Hospital Stay (HOSPITAL_COMMUNITY): Payer: Medicare Other | Admitting: Registered Nurse

## 2023-08-09 ENCOUNTER — Other Ambulatory Visit: Payer: Self-pay

## 2023-08-09 ENCOUNTER — Encounter (HOSPITAL_COMMUNITY): Payer: Self-pay | Admitting: Family Medicine

## 2023-08-09 ENCOUNTER — Inpatient Hospital Stay (HOSPITAL_COMMUNITY): Payer: Medicare Other

## 2023-08-09 DIAGNOSIS — I63421 Cerebral infarction due to embolism of right anterior cerebral artery: Secondary | ICD-10-CM | POA: Diagnosis not present

## 2023-08-09 DIAGNOSIS — I6521 Occlusion and stenosis of right carotid artery: Secondary | ICD-10-CM | POA: Diagnosis not present

## 2023-08-09 DIAGNOSIS — I6522 Occlusion and stenosis of left carotid artery: Secondary | ICD-10-CM | POA: Diagnosis not present

## 2023-08-09 DIAGNOSIS — I6300 Cerebral infarction due to thrombosis of unspecified precerebral artery: Secondary | ICD-10-CM | POA: Diagnosis not present

## 2023-08-09 HISTORY — PX: TRANSCAROTID ARTERY REVASCULARIZATIONÂ: SHX6778

## 2023-08-09 LAB — POCT ACTIVATED CLOTTING TIME: Activated Clotting Time: 317 s

## 2023-08-09 LAB — GLUCOSE, CAPILLARY
Glucose-Capillary: 127 mg/dL — ABNORMAL HIGH (ref 70–99)
Glucose-Capillary: 111 mg/dL — ABNORMAL HIGH (ref 70–99)
Glucose-Capillary: 197 mg/dL — ABNORMAL HIGH (ref 70–99)

## 2023-08-09 SURGERY — TRANSCAROTID ARTERY REVASCULARIZATION (TCAR)
Anesthesia: General | Laterality: Right

## 2023-08-09 MED ORDER — 0.9 % SODIUM CHLORIDE (POUR BTL) OPTIME
TOPICAL | Status: DC | PRN
  Administered 2023-08-09: 1000 mL

## 2023-08-09 MED ORDER — CEFAZOLIN SODIUM-DEXTROSE 2-4 GM/100ML-% IV SOLN
2.0000 g | Freq: Three times a day (TID) | INTRAVENOUS | Status: AC
  Administered 2023-08-09 – 2023-08-10 (×2): 2 g via INTRAVENOUS
  Filled 2023-08-09 (×2): qty 100

## 2023-08-09 MED ORDER — ORAL CARE MOUTH RINSE: 15.0000 mL | Freq: Once | OROMUCOSAL | Status: AC

## 2023-08-09 MED ORDER — LABETALOL HCL 5 MG/ML IV SOLN
10.0000 mg | INTRAVENOUS | Status: DC | PRN
  Filled 2023-08-09: qty 4

## 2023-08-09 MED ORDER — AMISULPRIDE (ANTIEMETIC) 5 MG/2ML IV SOLN: 10.0000 mg | Freq: Once | INTRAVENOUS | Status: DC | PRN

## 2023-08-09 MED ORDER — IODIXANOL 320 MG/ML IV SOLN
INTRAVENOUS | Status: DC | PRN
  Administered 2023-08-09: 12 mL via INTRA_ARTERIAL

## 2023-08-09 MED ORDER — HEPARIN 6000 UNIT IRRIGATION SOLUTION
Status: DC | PRN
  Administered 2023-08-09: 1

## 2023-08-09 MED ORDER — GLYCOPYRROLATE PF 0.2 MG/ML IJ SOSY
PREFILLED_SYRINGE | INTRAMUSCULAR | Status: AC
  Filled 2023-08-09: qty 2

## 2023-08-09 MED ORDER — ACETAMINOPHEN 10 MG/ML IV SOLN
INTRAVENOUS | Status: AC
  Filled 2023-08-09: qty 100

## 2023-08-09 MED ORDER — GLYCOPYRROLATE PF 0.2 MG/ML IJ SOSY
PREFILLED_SYRINGE | INTRAMUSCULAR | Status: DC | PRN
Start: 2023-08-09 — End: 2023-08-09
  Administered 2023-08-09: .2 mg via INTRAVENOUS

## 2023-08-09 MED ORDER — CHLORHEXIDINE GLUCONATE 0.12 % MT SOLN
15.0000 mL | Freq: Once | OROMUCOSAL | Status: AC
Start: 2023-08-09 — End: 2023-08-09
  Administered 2023-08-09: 15 mL via OROMUCOSAL

## 2023-08-09 MED ORDER — PROPOFOL 10 MG/ML IV BOLUS
INTRAVENOUS | Status: AC
  Filled 2023-08-09: qty 20

## 2023-08-09 MED ORDER — PROPOFOL 10 MG/ML IV BOLUS
INTRAVENOUS | Status: DC | PRN
  Administered 2023-08-09: 150 mg via INTRAVENOUS

## 2023-08-09 MED ORDER — ACETAMINOPHEN 10 MG/ML IV SOLN
INTRAVENOUS | Status: DC | PRN
  Administered 2023-08-09: 1000 mg via INTRAVENOUS

## 2023-08-09 MED ORDER — SUGAMMADEX SODIUM 200 MG/2ML IV SOLN
INTRAVENOUS | Status: DC | PRN
  Administered 2023-08-09: 200 mg via INTRAVENOUS

## 2023-08-09 MED ORDER — LIDOCAINE 2% (20 MG/ML) 5 ML SYRINGE
INTRAMUSCULAR | Status: DC | PRN
  Administered 2023-08-09: 60 mg via INTRAVENOUS

## 2023-08-09 MED ORDER — POTASSIUM CHLORIDE CRYS ER 20 MEQ PO TBCR: 20.0000 meq | EXTENDED_RELEASE_TABLET | Freq: Every day | ORAL | Status: DC | PRN

## 2023-08-09 MED ORDER — FENTANYL CITRATE (PF) 250 MCG/5ML IJ SOLN
INTRAMUSCULAR | Status: DC | PRN
  Administered 2023-08-09 (×3): 50 ug via INTRAVENOUS

## 2023-08-09 MED ORDER — HEPARIN SODIUM (PORCINE) 1000 UNIT/ML IJ SOLN
INTRAMUSCULAR | Status: AC
  Filled 2023-08-09: qty 10

## 2023-08-09 MED ORDER — SODIUM CHLORIDE 0.9 % IV SOLN: INTRAVENOUS | Status: AC

## 2023-08-09 MED ORDER — GUAIFENESIN-DM 100-10 MG/5ML PO SYRP
15.0000 mL | ORAL_SOLUTION | ORAL | Status: DC | PRN
Start: 2023-08-09 — End: 2023-08-12

## 2023-08-09 MED ORDER — SODIUM CHLORIDE 0.9 % IV SOLN
500.0000 mL | Freq: Once | INTRAVENOUS | Status: AC | PRN
  Administered 2023-08-09: 500 mL via INTRAVENOUS

## 2023-08-09 MED ORDER — ALBUMIN HUMAN 5 % IV SOLN: INTRAVENOUS | Status: DC | PRN

## 2023-08-09 MED ORDER — PHENYLEPHRINE HCL-NACL 20-0.9 MG/250ML-% IV SOLN
INTRAVENOUS | Status: DC | PRN
  Administered 2023-08-09: 50 ug/min via INTRAVENOUS

## 2023-08-09 MED ORDER — MORPHINE SULFATE (PF) 2 MG/ML IV SOLN: 2.0000 mg | INTRAVENOUS | Status: DC | PRN

## 2023-08-09 MED ORDER — DEXAMETHASONE SODIUM PHOSPHATE 10 MG/ML IJ SOLN
INTRAMUSCULAR | Status: DC | PRN
  Administered 2023-08-09: 5 mg via INTRAVENOUS

## 2023-08-09 MED ORDER — HEPARIN SODIUM (PORCINE) 1000 UNIT/ML IJ SOLN
INTRAMUSCULAR | Status: DC | PRN
  Administered 2023-08-09: 10000 [IU] via INTRAVENOUS

## 2023-08-09 MED ORDER — MAGNESIUM SULFATE 2 GM/50ML IV SOLN: 2.0000 g | Freq: Every day | INTRAVENOUS | Status: DC | PRN

## 2023-08-09 MED ORDER — ALUM & MAG HYDROXIDE-SIMETH 200-200-20 MG/5ML PO SUSP
15.0000 mL | ORAL | Status: DC | PRN
Start: 2023-08-09 — End: 2023-08-12
  Filled 2023-08-09: qty 30

## 2023-08-09 MED ORDER — AMISULPRIDE (ANTIEMETIC) 5 MG/2ML IV SOLN
INTRAVENOUS | Status: AC
  Filled 2023-08-09: qty 4

## 2023-08-09 MED ORDER — ONDANSETRON HCL 4 MG/2ML IJ SOLN: 4.0000 mg | Freq: Four times a day (QID) | INTRAMUSCULAR | Status: DC | PRN

## 2023-08-09 MED ORDER — LABETALOL HCL 5 MG/ML IV SOLN
INTRAVENOUS | Status: DC | PRN
  Administered 2023-08-09: 5 mg via INTRAVENOUS

## 2023-08-09 MED ORDER — HYDRALAZINE HCL 20 MG/ML IJ SOLN
10.0000 mg | Freq: Four times a day (QID) | INTRAMUSCULAR | Status: DC | PRN
  Administered 2023-08-09 – 2023-08-11 (×2): 10 mg via INTRAVENOUS
  Filled 2023-08-09 (×2): qty 1

## 2023-08-09 MED ORDER — PROTAMINE SULFATE 10 MG/ML IV SOLN
INTRAVENOUS | Status: DC | PRN
Start: 2023-08-09 — End: 2023-08-09
  Administered 2023-08-09: 45 mg via INTRAVENOUS
  Administered 2023-08-09: 5 mg via INTRAVENOUS

## 2023-08-09 MED ORDER — OXYCODONE HCL 5 MG PO TABS: 5.0000 mg | ORAL_TABLET | ORAL | Status: DC | PRN

## 2023-08-09 MED ORDER — ROCURONIUM BROMIDE 10 MG/ML (PF) SYRINGE
PREFILLED_SYRINGE | INTRAVENOUS | Status: DC | PRN
  Administered 2023-08-09: 60 mg via INTRAVENOUS

## 2023-08-09 MED ORDER — METOPROLOL TARTRATE 5 MG/5ML IV SOLN: 2.0000 mg | INTRAVENOUS | Status: DC | PRN

## 2023-08-09 MED ORDER — ENOXAPARIN SODIUM 40 MG/0.4ML IJ SOSY
40.0000 mg | PREFILLED_SYRINGE | INTRAMUSCULAR | Status: DC
  Administered 2023-08-10 – 2023-08-12 (×3): 40 mg via SUBCUTANEOUS
  Filled 2023-08-09 (×3): qty 0.4

## 2023-08-09 MED ORDER — ONDANSETRON HCL 4 MG/2ML IJ SOLN
INTRAMUSCULAR | Status: DC | PRN
  Administered 2023-08-09: 4 mg via INTRAVENOUS

## 2023-08-09 MED ORDER — LIDOCAINE HCL (PF) 1 % IJ SOLN
INTRAMUSCULAR | Status: AC
  Filled 2023-08-09: qty 5

## 2023-08-09 MED ORDER — FENTANYL CITRATE (PF) 100 MCG/2ML IJ SOLN: 25.0000 ug | INTRAMUSCULAR | Status: DC | PRN

## 2023-08-09 MED ORDER — FENTANYL CITRATE (PF) 250 MCG/5ML IJ SOLN
INTRAMUSCULAR | Status: AC
  Filled 2023-08-09: qty 5

## 2023-08-09 MED ORDER — LACTATED RINGERS IV SOLN: INTRAVENOUS | Status: DC

## 2023-08-09 MED ORDER — EPHEDRINE SULFATE-NACL 50-0.9 MG/10ML-% IV SOSY
PREFILLED_SYRINGE | INTRAVENOUS | Status: DC | PRN
  Administered 2023-08-09 (×2): 5 mg via INTRAVENOUS
  Administered 2023-08-09: 10 mg via INTRAVENOUS

## 2023-08-09 MED ORDER — LACTATED RINGERS IV SOLN: INTRAVENOUS | Status: AC

## 2023-08-09 MED ORDER — CEFAZOLIN SODIUM-DEXTROSE 2-4 GM/100ML-% IV SOLN
INTRAVENOUS | Status: AC
  Filled 2023-08-09: qty 100

## 2023-08-09 MED ORDER — CEFAZOLIN SODIUM-DEXTROSE 2-3 GM-%(50ML) IV SOLR
INTRAVENOUS | Status: DC | PRN
  Administered 2023-08-09: 2 g via INTRAVENOUS

## 2023-08-09 MED ORDER — INSULIN ASPART 100 UNIT/ML IJ SOLN: 0.0000 [IU] | INTRAMUSCULAR | Status: DC | PRN

## 2023-08-09 MED ORDER — DOCUSATE SODIUM 100 MG PO CAPS
100.0000 mg | ORAL_CAPSULE | Freq: Every day | ORAL | Status: DC
  Administered 2023-08-10 – 2023-08-12 (×3): 100 mg via ORAL
  Filled 2023-08-09 (×3): qty 1

## 2023-08-09 SURGICAL SUPPLY — 55 items
ADH SKN CLS LQ APL DERMABOND (GAUZE/BANDAGES/DRESSINGS) ×1
APL PRP STRL LF DISP 70% ISPRP (MISCELLANEOUS) ×2
APL SKNCLS STERI-STRIP NONHPOA (GAUZE/BANDAGES/DRESSINGS) ×3
BAG BANDED W/RUBBER/TAPE 36X54 (MISCELLANEOUS) ×2 IMPLANT
BAG EQP BAND 135X91 W/RBR TAPE (MISCELLANEOUS) ×1
BENZOIN TINCTURE PRP APPL 2/3 (GAUZE/BANDAGES/DRESSINGS) IMPLANT
CANISTER SUCT 3000ML PPV (MISCELLANEOUS) ×2 IMPLANT
CATH BALLN ENROUTE 5X35 (CATHETERS) IMPLANT
CHLORAPREP W/TINT 26 (MISCELLANEOUS) ×4 IMPLANT
CLIP LIGATING EXTRA MED SLVR (CLIP) IMPLANT
CLIP LIGATING EXTRA SM BLUE (MISCELLANEOUS) IMPLANT
CLSR STERI-STRIP ANTIMIC 1/2X4 (GAUZE/BANDAGES/DRESSINGS) IMPLANT
COVER DOME SNAP 22 D (MISCELLANEOUS) ×2 IMPLANT
COVER PROBE W GEL 5X96 (DRAPES) ×2 IMPLANT
DERMABOND ADVANCED .7 DNX6 (GAUZE/BANDAGES/DRESSINGS) ×2 IMPLANT
DRAPE FEMORAL ANGIO 80X135IN (DRAPES) ×2 IMPLANT
DRSG TEGADERM 2-3/8X2-3/4 SM (GAUZE/BANDAGES/DRESSINGS) IMPLANT
DRSG TEGADERM 4X4.75 (GAUZE/BANDAGES/DRESSINGS) IMPLANT
ELECT REM PT RETURN 9FT ADLT (ELECTROSURGICAL) ×1
ELECTRODE REM PT RTRN 9FT ADLT (ELECTROSURGICAL) ×2 IMPLANT
GAUZE SPONGE 2X2 STRL 8-PLY (GAUZE/BANDAGES/DRESSINGS) IMPLANT
GAUZE SPONGE 4X4 12PLY STRL (GAUZE/BANDAGES/DRESSINGS) ×2 IMPLANT
GAUZE SPONGE 4X4 12PLY STRL LF (GAUZE/BANDAGES/DRESSINGS) IMPLANT
GLOVE BIO SURGEON STRL SZ8 (GLOVE) ×2 IMPLANT
GOWN STRL REUS W/ TWL LRG LVL3 (GOWN DISPOSABLE) ×4 IMPLANT
GOWN STRL REUS W/ TWL XL LVL3 (GOWN DISPOSABLE) ×2 IMPLANT
GOWN STRL REUS W/TWL LRG LVL3 (GOWN DISPOSABLE) ×2
GOWN STRL REUS W/TWL XL LVL3 (GOWN DISPOSABLE) ×1
GUIDEWIRE ENROUTE 0.014 (WIRE) ×2 IMPLANT
KIT BASIN OR (CUSTOM PROCEDURE TRAY) ×2 IMPLANT
KIT ENCORE 26 ADVANTAGE (KITS) ×2 IMPLANT
KIT INTRODUCER GALT 7 (INTRODUCER) ×2 IMPLANT
KIT TURNOVER KIT B (KITS) ×2 IMPLANT
NDL HYPO 25GX1X1/2 BEV (NEEDLE) IMPLANT
NEEDLE HYPO 25GX1X1/2 BEV (NEEDLE) IMPLANT
NS IRRIG 1000ML POUR BTL (IV SOLUTION) ×2 IMPLANT
PACK CAROTID (CUSTOM PROCEDURE TRAY) ×2 IMPLANT
POSITIONER HEAD DONUT 9IN (MISCELLANEOUS) ×2 IMPLANT
SET MICROPUNCTURE 5F STIFF (MISCELLANEOUS) ×2 IMPLANT
SHEATH AVANTI 11CM 5FR (SHEATH) IMPLANT
SHUNT CAROTID BYPASS 10 (VASCULAR PRODUCTS) IMPLANT
STENT TRANSCAROTID SYSTEM 9X40 (Permanent Stent) IMPLANT
SUT MNCRL AB 4-0 PS2 18 (SUTURE) ×2 IMPLANT
SUT PROLENE 5 0 C 1 24 (SUTURE) ×2 IMPLANT
SUT SILK 2 0 SH (SUTURE) ×2 IMPLANT
SUT VIC AB 3-0 SH 27 (SUTURE) ×1
SUT VIC AB 3-0 SH 27X BRD (SUTURE) ×2 IMPLANT
SYR 10ML LL (SYRINGE) ×6 IMPLANT
SYR 20ML LL LF (SYRINGE) ×2 IMPLANT
SYR CONTROL 10ML LL (SYRINGE) IMPLANT
SYSTEM TRANSCAROTID NEUROPRTCT (MISCELLANEOUS) ×2 IMPLANT
TOWEL GREEN STERILE (TOWEL DISPOSABLE) ×2 IMPLANT
TRANSCAROTID NEUROPROTECT SYS (MISCELLANEOUS) ×1
WATER STERILE IRR 1000ML POUR (IV SOLUTION) ×2 IMPLANT
WIRE BENTSON .035X145CM (WIRE) ×2 IMPLANT

## 2023-08-09 NOTE — Anesthesia Preprocedure Evaluation (Signed)
Anesthesia Evaluation  Patient identified by MRN, date of birth, ID band Patient awake    Reviewed: Allergy & Precautions, NPO status , Patient's Chart, lab work & pertinent test results  History of Anesthesia Complications (+) PONV and history of anesthetic complications  Airway Mallampati: II  TM Distance: >3 FB Neck ROM: Full    Dental   Pulmonary asthma , sleep apnea and Continuous Positive Airway Pressure Ventilation    breath sounds clear to auscultation       Cardiovascular hypertension, Pt. on medications  Rhythm:Regular Rate:Normal     Neuro/Psych  Neuromuscular disease CVA    GI/Hepatic Neg liver ROS, hiatal hernia,GERD  ,,  Endo/Other  diabetes    Renal/GU Renal InsufficiencyRenal disease     Musculoskeletal  (+) Arthritis ,  Fibromyalgia -  Abdominal   Peds  Hematology negative hematology ROS (+)   Anesthesia Other Findings   Reproductive/Obstetrics                             Anesthesia Physical Anesthesia Plan  ASA: 3  Anesthesia Plan: General   Post-op Pain Management: Tylenol PO (pre-op)*   Induction: Intravenous  PONV Risk Score and Plan: 4 or greater and Ondansetron, Dexamethasone and Treatment may vary due to age or medical condition  Airway Management Planned: Oral ETT  Additional Equipment: Arterial line  Intra-op Plan:   Post-operative Plan: Extubation in OR  Informed Consent: I have reviewed the patients History and Physical, chart, labs and discussed the procedure including the risks, benefits and alternatives for the proposed anesthesia with the patient or authorized representative who has indicated his/her understanding and acceptance.     Dental advisory given  Plan Discussed with: CRNA  Anesthesia Plan Comments:        Anesthesia Quick Evaluation

## 2023-08-09 NOTE — Progress Notes (Signed)
Pt complaining of headache all over head, Dr. Karin Lieu notified and prn hydralazine given for BP of 152/70 per Dr. Karin Lieu, report given to on coming nurse.

## 2023-08-09 NOTE — Plan of Care (Signed)
  Problem: Education: Goal: Knowledge of disease or condition will improve Outcome: Progressing Goal: Knowledge of secondary prevention will improve (MUST DOCUMENT ALL) Outcome: Progressing Goal: Knowledge of patient specific risk factors will improve Loraine Leriche N/A or DELETE if not current risk factor) Outcome: Progressing   Problem: Ischemic Stroke/TIA Tissue Perfusion: Goal: Complications of ischemic stroke/TIA will be minimized Outcome: Progressing   Problem: Coping: Goal: Will verbalize positive feelings about self Outcome: Progressing Goal: Will identify appropriate support needs Outcome: Progressing   Problem: Education: Goal: Knowledge of General Education information will improve Description: Including pain rating scale, medication(s)/side effects and non-pharmacologic comfort measures Outcome: Progressing   Problem: Clinical Measurements: Goal: Ability to maintain clinical measurements within normal limits will improve Outcome: Progressing Goal: Will remain free from infection Outcome: Progressing Goal: Diagnostic test results will improve Outcome: Progressing

## 2023-08-09 NOTE — Progress Notes (Signed)
Gave patient incentive spirometer and instructed her to remove all jewelry and dentures for procedure today.

## 2023-08-09 NOTE — Op Note (Signed)
DATE OF SERVICE: 08/09/2023  PATIENT:  [name]  82 y.o. female  PRE-OPERATIVE DIAGNOSIS:  symptomatic right carotid artery stenosis  POST-OPERATIVE DIAGNOSIS:  Same  PROCEDURE:   Right transcarotid artery revascularization (TCAR)  SURGEON:  Surgeons and Role:    * Leonie Douglas, MD - Primary  ASSISTANT: Loel Dubonnet, PA-C  An experienced assistant was required given the complexity of this procedure and the standard of surgical care. My assistant helped with exposure through counter tension, suctioning, ligation and retraction to better visualize the surgical field.  My assistant expedited sewing during the case by following my sutures. Wherever I use the term "we" in the report, my assistant actively helped me with that portion of the procedure.  ANESTHESIA:   general  EBL: 50mL  BLOOD ADMINISTERED:none  DRAINS: none   LOCAL MEDICATIONS USED:  NONE  SPECIMEN:  none  COUNTS: confirmed correct.  TOURNIQUET:  none  PATIENT DISPOSITION:  PACU - hemodynamically stable.   Delay start of Pharmacological VTE agent (>24hrs) due to surgical blood loss or risk of bleeding: no  INDICATION FOR PROCEDURE: [name] is a 82 y.o. female with symptomatic right carotid artery stenosis. After careful discussion of risks, benefits, and alternatives the patient was offered TCAR. We specifically discussed risk of stroke, cranial nerve injury, and hematoma. The patient understood and wished to proceed.  OPERATIVE FINDINGS: successful placement of internal carotid artery stent with good angiographic result. Patient awoke with mild weakness in left upper and lower extremity which improved during observation in the operating room.   DESCRIPTION OF PROCEDURE: After identification of the patient in the pre-operative holding area, the patient was transferred to the operating room. The patient was positioned supine on the operating room table. Anesthesia was induced. The neck and groins were  prepped and draped in standard fashion. A surgical pause was performed confirming correct patient, procedure, and operative location.  Using intraoperative ultrasound the course of the right common carotid artery was mapped on the skin.  A longitudinal incision was made between the sternal and clavicular heads of the sternocleidomastoid muscle, below the omohyoid. Following longitudinal division of the carotid sheath the jugular vein was partially skeletonized and retracted medially. Once 3 cm of common carotid artery (CCA) were isolated, umbilical tape was placed around the proximal 1/3 of the CCA under direct vision. A 5-O Prolene suture was pre-placed in the anterior wall of the CCA, in a "U stitch" configuration, close to the clavicle to facilitate hemostasis upon removal of the arterial sheath at completion of the TCAR procedure.   The contralateral common femoral vein (CFV) was accessed under ultrasound guidance, using standard Seldinger and micropuncture access technique. The venous return sheath was advanced into the CFV over the 0.035" wire provided. Blood was aspirated from the flow line followed by flushing of the Venous Sheath with heparinized saline. The Venous Sheath was secured to the patient's skin with suture to maintain optimal position in the vessel. Heparin was given to obtain a therapeutic activated clotting time >250 seconds prior to arterial access.   A 4-French non-stiffened ENHANCE Transcarotid / Peripheral Access set was used, puncturing the artery with the 21G needle through the pre-placed "U" stitch while holding gentle traction on the umbilical tape to stabilize and centralize the CCA within the incision. Careful attention was paid to the change in CCA shape when using the umbilical tape to control or lift the artery. The micropuncture wire was then advanced 3-4 cm into the CCA  and, the 21G needle was removed. The micropuncture sheath was advanced 2-3 cm into the CCA and the wire  and dilator were removed. Pulsatile backflow indicated correct positioning. The provided 0.035" J-tipped guidewire was inserted as close as possible to the bifurcation without engaging the lesion. After micropuncture sheath removal, the Transcarotid Arterial Sheath was advanced to the 2.5cm marker and the 0.035" wire and dilator were then removed. Arterial Sheath position was assessed under fluoroscopy in two projections to ensure that the sheath tip was oriented coaxially in the CCA. The Arterial Sheath was sutured to the patient with gentle forward tension. Blood was slowly aspirated followed by flushing with heparinized saline. No ingress of air bubbles through the passive hemostatic valve was observed. The stopcocks were closed. Traction applied to the CCA previously to facilitate access was gently released.  The Flow Controller was connected to the Transcarotid Arterial Sheath, prepared by passively allowing a column of arterial blood to fill the line and connected to the Venous Return Sheath. CCA inflow was occluded proximal to the arteriotomy with a vascular clamp to achieve active flow reversal. To confirm flow reversal, a saline bolus was delivered into the venous flow line on both "High" and "Low" flow settings of the Flow Controller. Angiograms were performed with slow injections of a small amount of contrast filling just past the lesion to minimize antegrade transmission of micro-bubbles.   Prior to lesion manipulation, heart rate (70bpm) and systolic BP (140-121mmHg) were managed upwards to optimize flow reversal and procedural neuroprotection. The lesion was crossed with an 0.014" ENROUTE guidewire and pre-dilation of the lesion was performed with a 5x69mm rapid exchange 0.014" compatible balloon catheter to 8 atmospheres for 10 seconds. Stenting was performed with an 9x72mm ENROUTE Transcarotid stent, sized appropriately to the right CCA.  A total of 9 minutes of flow reversal was used.  AP  angiogram (gentle contrast injections) were performed to confirm stent placement and arterial wall stent apposition. At [facility] case completion, antegrade flow was restored by releasing the clamp on the CCA then closing the NPS stopcocks to the flow lines. The Transcarotid Arterial Sheath was removed and the pre-closure suture was tied. Heparin reversal was employed. The Venous Return Sheath was removed and hemostasis was achieved with brief manual compression.   Doppler flow was confirmed to be normal across the access site. The neck incision was closed in layers using 3-O vicryl and 4-O monocryl. Clean bandage was applied to the neck.   Upon completion of the case instrument and sharps counts were confirmed correct. The patient was transferred to the PACU in good condition. I was present for all portions of the procedure.  FOLLOW UP PLAN: Assuming a normal postoperative course, I will see the patient in 4 weeks with carotid duplex.   Rande Brunt. Lenell Antu, MD [facility] Phone Number: [phone] 08/09/2023 2:04 PM

## 2023-08-09 NOTE — Anesthesia Procedure Notes (Addendum)
Procedure Name: Intubation Date/Time: 08/09/2023 12:32 PM  Performed by: Loleta Casia Corti, CRNAPre-anesthesia Checklist: Patient identified, Patient being monitored, Timeout performed, Emergency Drugs available and Suction available Patient Re-evaluated:Patient Re-evaluated prior to induction Oxygen Delivery Method: Circle system utilized Preoxygenation: Pre-oxygenation with 100% oxygen Induction Type: IV induction Ventilation: Mask ventilation without difficulty Laryngoscope Size: Mac and 4 Grade View: Grade I Tube type: Oral Tube size: 7.0 mm Number of attempts: 1 Airway Equipment and Method: Stylet Placement Confirmation: ETT inserted through vocal cords under direct vision, positive ETCO2 and breath sounds checked- equal and bilateral Secured at: 21 cm Tube secured with: Tape Dental Injury: Teeth and Oropharynx as per pre-operative assessment

## 2023-08-09 NOTE — Progress Notes (Signed)
PT Cancellation Note  Patient Details Name: [name] MRN: [id number] DOB: 11-Jun-1941   Cancelled Treatment:    Reason Eval/Treat Not Completed: (P) Patient at procedure or test/unavailable (Pt off unit for R TCAR procedure.) Will continue efforts per PT plan of care as schedule permits.   Dorathy Kinsman Catrinia Racicot 08/09/2023, 10:53 AM

## 2023-08-09 NOTE — Progress Notes (Signed)
PROGRESS NOTE                                                                                                                                                                                                             Patient Demographics:    [name], is a 82 y.o. female, DOB - 1940/10/23, ZOX:[id number]  Outpatient Primary MD for the patient is [name], MD    LOS - 2  Admit date - 08/06/2023    Chief Complaint  Patient presents with   Weakness       Brief Narrative (HPI from H&P)   82 y.o. female with medical history significant of HTN, T2DM, HLD, anxiety, GERD, recurrent syncope s/p loop recorder who presented with intermittent LUE weakness and paresthesia.  As per patient for past 3 to 4 months she noticed intermittent weakness and numbness of left arm from shoulder to wrist.  On arrival to ED blood pressure was high at 197/62.  MRI brain showed multiple small acute infarcts of the right cerebral hemisphere.      Subjective:   Patient in bed, appears comfortable, denies any headache, no fever, no chest pain or pressure, no shortness of breath , no abdominal pain. No new focal weakness.  Left sided weakness improving, eager to go for surgery.   Assessment  & Plan :    CVA -MRI brain showed multiple small acute infarcts in the right cerebral hemisphere -CT head and neck showed 70% stenosis of the proximal right ICA with mixed density atherosclerosis, 6.4 mm.  Projecting aneurysm of the left ICA.  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 -Discussed with neurology Dr Pearlean Brownie 08/08/23 - trial of aspirin and Plavix 3 months with PPI on board and monitor.  After 3 months Plavix alone. Her left sided deficits improving.  [facility]  saw the patient initially planned outpatient procedure however patient wanted to consider inpatient procedure which now has been arranged.   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  Seen by [facility].  DAPT and statin for now, going for carotid endarterectomy on 08/09/2023.  She is agreeable for transfusions if needed during  PROGRESS NOTE                                                                                                                                                                                                             Patient Demographics:    [name], is a 82 y.o. female, DOB - 1940/10/23, ZOX:[id number]  Outpatient Primary MD for the patient is [name], MD    LOS - 2  Admit date - 08/06/2023    Chief Complaint  Patient presents with   Weakness       Brief Narrative (HPI from H&P)   82 y.o. female with medical history significant of HTN, T2DM, HLD, anxiety, GERD, recurrent syncope s/p loop recorder who presented with intermittent LUE weakness and paresthesia.  As per patient for past 3 to 4 months she noticed intermittent weakness and numbness of left arm from shoulder to wrist.  On arrival to ED blood pressure was high at 197/62.  MRI brain showed multiple small acute infarcts of the right cerebral hemisphere.      Subjective:   Patient in bed, appears comfortable, denies any headache, no fever, no chest pain or pressure, no shortness of breath , no abdominal pain. No new focal weakness.  Left sided weakness improving, eager to go for surgery.   Assessment  & Plan :    CVA -MRI brain showed multiple small acute infarcts in the right cerebral hemisphere -CT head and neck showed 70% stenosis of the proximal right ICA with mixed density atherosclerosis, 6.4 mm.  Projecting aneurysm of the left ICA.  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 -Discussed with neurology Dr Pearlean Brownie 08/08/23 - trial of aspirin and Plavix 3 months with PPI on board and monitor.  After 3 months Plavix alone. Her left sided deficits improving.  [facility]  saw the patient initially planned outpatient procedure however patient wanted to consider inpatient procedure which now has been arranged.   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  Seen by [facility].  DAPT and statin for now, going for carotid endarterectomy on 08/09/2023.  She is agreeable for transfusions if needed during  PROGRESS NOTE                                                                                                                                                                                                             Patient Demographics:    [name], is a 82 y.o. female, DOB - 1940/10/23, ZOX:[id number]  Outpatient Primary MD for the patient is [name], MD    LOS - 2  Admit date - 08/06/2023    Chief Complaint  Patient presents with   Weakness       Brief Narrative (HPI from H&P)   82 y.o. female with medical history significant of HTN, T2DM, HLD, anxiety, GERD, recurrent syncope s/p loop recorder who presented with intermittent LUE weakness and paresthesia.  As per patient for past 3 to 4 months she noticed intermittent weakness and numbness of left arm from shoulder to wrist.  On arrival to ED blood pressure was high at 197/62.  MRI brain showed multiple small acute infarcts of the right cerebral hemisphere.      Subjective:   Patient in bed, appears comfortable, denies any headache, no fever, no chest pain or pressure, no shortness of breath , no abdominal pain. No new focal weakness.  Left sided weakness improving, eager to go for surgery.   Assessment  & Plan :    CVA -MRI brain showed multiple small acute infarcts in the right cerebral hemisphere -CT head and neck showed 70% stenosis of the proximal right ICA with mixed density atherosclerosis, 6.4 mm.  Projecting aneurysm of the left ICA.  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 -Discussed with neurology Dr Pearlean Brownie 08/08/23 - trial of aspirin and Plavix 3 months with PPI on board and monitor.  After 3 months Plavix alone. Her left sided deficits improving.  [facility]  saw the patient initially planned outpatient procedure however patient wanted to consider inpatient procedure which now has been arranged.   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  Seen by [facility].  DAPT and statin for now, going for carotid endarterectomy on 08/09/2023.  She is agreeable for transfusions if needed during  Accession #:    [id number]  Weight:       215.0 lb  Date of Birth:  Jul 19, 1941  BSA:          2.174 m Patient Age:    81 years    BP:           172/57 mmHg Patient Gender: F           HR:           63 bpm. Exam Location:  Inpatient Procedure: 2D Echo, Cardiac Doppler and Color Doppler Indications:    Stroke I63.9  History:        Patient has prior history of Echocardiogram examinations, most                 recent 10/22/2022. Arrythmias:Bradycardia; Risk Factors:Diabetes,                 Dyslipidemia, Hypertension and Sleep Apnea.  Sonographer:    Eulah Pont RDCS Referring Phys: [id number] CHING T TU IMPRESSIONS  1. Left ventricular ejection fraction, by estimation, is 65 to 70%. The left ventricle has normal function. The left ventricle has no regional wall motion abnormalities. There is mild concentric left ventricular hypertrophy. Left ventricular diastolic parameters are consistent with Grade I diastolic dysfunction (impaired relaxation).  2. Right ventricular systolic function is normal. The right ventricular size is normal.  3. Left atrial size was moderately dilated.  4. Right atrial size was mildly dilated.  5. The mitral valve is normal in structure. Trivial mitral valve regurgitation. No evidence of mitral stenosis.  6. The aortic valve is tricuspid. There is mild calcification of the aortic valve. Aortic valve regurgitation is trivial. Aortic valve sclerosis/calcification is present, without any evidence of aortic stenosis.  7. Aortic dilatation noted. There is borderline dilatation of the ascending aorta, measuring 38 mm.  8. The inferior vena cava is normal in size with greater than 50% respiratory variability, suggesting right atrial pressure of 3 mmHg. FINDINGS  Left Ventricle: Left ventricular ejection fraction, by estimation, is 65 to 70%. The left ventricle has normal function. The left ventricle has no regional wall motion abnormalities. The left ventricular internal cavity size was normal in size. There is  mild concentric left ventricular  hypertrophy. Left ventricular diastolic parameters are consistent with Grade I diastolic dysfunction (impaired relaxation). Right Ventricle: The right ventricular size is normal. No increase in right ventricular wall thickness. Right ventricular systolic function is normal. Left Atrium: Left atrial size was moderately dilated. Right Atrium: Right atrial size was mildly dilated. Pericardium: There is no evidence of pericardial effusion. Mitral Valve: The mitral valve is normal in structure. Mild mitral annular calcification. Trivial mitral valve regurgitation. No evidence of mitral valve stenosis. Tricuspid Valve: The tricuspid valve is normal in structure. Tricuspid valve regurgitation is trivial. No evidence of tricuspid stenosis. Aortic Valve: The aortic valve is tricuspid. There is mild calcification of the aortic valve. Aortic valve regurgitation is trivial. Aortic valve sclerosis/calcification is present, without any evidence of aortic stenosis. Pulmonic Valve: The pulmonic valve was normal in structure. Pulmonic valve regurgitation is not visualized. No evidence of pulmonic stenosis. Aorta: Aortic dilatation noted. There is borderline dilatation of the ascending aorta, measuring 38 mm. Venous: The inferior vena cava is normal in size with greater than 50% respiratory variability, suggesting right atrial pressure of 3 mmHg. IAS/Shunts: No atrial level shunt detected by color flow Doppler.  LEFT VENTRICLE PLAX 2D LVIDd:  Accession #:    [id number]  Weight:       215.0 lb  Date of Birth:  Jul 19, 1941  BSA:          2.174 m Patient Age:    81 years    BP:           172/57 mmHg Patient Gender: F           HR:           63 bpm. Exam Location:  Inpatient Procedure: 2D Echo, Cardiac Doppler and Color Doppler Indications:    Stroke I63.9  History:        Patient has prior history of Echocardiogram examinations, most                 recent 10/22/2022. Arrythmias:Bradycardia; Risk Factors:Diabetes,                 Dyslipidemia, Hypertension and Sleep Apnea.  Sonographer:    Eulah Pont RDCS Referring Phys: [id number] CHING T TU IMPRESSIONS  1. Left ventricular ejection fraction, by estimation, is 65 to 70%. The left ventricle has normal function. The left ventricle has no regional wall motion abnormalities. There is mild concentric left ventricular hypertrophy. Left ventricular diastolic parameters are consistent with Grade I diastolic dysfunction (impaired relaxation).  2. Right ventricular systolic function is normal. The right ventricular size is normal.  3. Left atrial size was moderately dilated.  4. Right atrial size was mildly dilated.  5. The mitral valve is normal in structure. Trivial mitral valve regurgitation. No evidence of mitral stenosis.  6. The aortic valve is tricuspid. There is mild calcification of the aortic valve. Aortic valve regurgitation is trivial. Aortic valve sclerosis/calcification is present, without any evidence of aortic stenosis.  7. Aortic dilatation noted. There is borderline dilatation of the ascending aorta, measuring 38 mm.  8. The inferior vena cava is normal in size with greater than 50% respiratory variability, suggesting right atrial pressure of 3 mmHg. FINDINGS  Left Ventricle: Left ventricular ejection fraction, by estimation, is 65 to 70%. The left ventricle has normal function. The left ventricle has no regional wall motion abnormalities. The left ventricular internal cavity size was normal in size. There is  mild concentric left ventricular  hypertrophy. Left ventricular diastolic parameters are consistent with Grade I diastolic dysfunction (impaired relaxation). Right Ventricle: The right ventricular size is normal. No increase in right ventricular wall thickness. Right ventricular systolic function is normal. Left Atrium: Left atrial size was moderately dilated. Right Atrium: Right atrial size was mildly dilated. Pericardium: There is no evidence of pericardial effusion. Mitral Valve: The mitral valve is normal in structure. Mild mitral annular calcification. Trivial mitral valve regurgitation. No evidence of mitral valve stenosis. Tricuspid Valve: The tricuspid valve is normal in structure. Tricuspid valve regurgitation is trivial. No evidence of tricuspid stenosis. Aortic Valve: The aortic valve is tricuspid. There is mild calcification of the aortic valve. Aortic valve regurgitation is trivial. Aortic valve sclerosis/calcification is present, without any evidence of aortic stenosis. Pulmonic Valve: The pulmonic valve was normal in structure. Pulmonic valve regurgitation is not visualized. No evidence of pulmonic stenosis. Aorta: Aortic dilatation noted. There is borderline dilatation of the ascending aorta, measuring 38 mm. Venous: The inferior vena cava is normal in size with greater than 50% respiratory variability, suggesting right atrial pressure of 3 mmHg. IAS/Shunts: No atrial level shunt detected by color flow Doppler.  LEFT VENTRICLE PLAX 2D LVIDd:  Accession #:    [id number]  Weight:       215.0 lb  Date of Birth:  Jul 19, 1941  BSA:          2.174 m Patient Age:    81 years    BP:           172/57 mmHg Patient Gender: F           HR:           63 bpm. Exam Location:  Inpatient Procedure: 2D Echo, Cardiac Doppler and Color Doppler Indications:    Stroke I63.9  History:        Patient has prior history of Echocardiogram examinations, most                 recent 10/22/2022. Arrythmias:Bradycardia; Risk Factors:Diabetes,                 Dyslipidemia, Hypertension and Sleep Apnea.  Sonographer:    Eulah Pont RDCS Referring Phys: [id number] CHING T TU IMPRESSIONS  1. Left ventricular ejection fraction, by estimation, is 65 to 70%. The left ventricle has normal function. The left ventricle has no regional wall motion abnormalities. There is mild concentric left ventricular hypertrophy. Left ventricular diastolic parameters are consistent with Grade I diastolic dysfunction (impaired relaxation).  2. Right ventricular systolic function is normal. The right ventricular size is normal.  3. Left atrial size was moderately dilated.  4. Right atrial size was mildly dilated.  5. The mitral valve is normal in structure. Trivial mitral valve regurgitation. No evidence of mitral stenosis.  6. The aortic valve is tricuspid. There is mild calcification of the aortic valve. Aortic valve regurgitation is trivial. Aortic valve sclerosis/calcification is present, without any evidence of aortic stenosis.  7. Aortic dilatation noted. There is borderline dilatation of the ascending aorta, measuring 38 mm.  8. The inferior vena cava is normal in size with greater than 50% respiratory variability, suggesting right atrial pressure of 3 mmHg. FINDINGS  Left Ventricle: Left ventricular ejection fraction, by estimation, is 65 to 70%. The left ventricle has normal function. The left ventricle has no regional wall motion abnormalities. The left ventricular internal cavity size was normal in size. There is  mild concentric left ventricular  hypertrophy. Left ventricular diastolic parameters are consistent with Grade I diastolic dysfunction (impaired relaxation). Right Ventricle: The right ventricular size is normal. No increase in right ventricular wall thickness. Right ventricular systolic function is normal. Left Atrium: Left atrial size was moderately dilated. Right Atrium: Right atrial size was mildly dilated. Pericardium: There is no evidence of pericardial effusion. Mitral Valve: The mitral valve is normal in structure. Mild mitral annular calcification. Trivial mitral valve regurgitation. No evidence of mitral valve stenosis. Tricuspid Valve: The tricuspid valve is normal in structure. Tricuspid valve regurgitation is trivial. No evidence of tricuspid stenosis. Aortic Valve: The aortic valve is tricuspid. There is mild calcification of the aortic valve. Aortic valve regurgitation is trivial. Aortic valve sclerosis/calcification is present, without any evidence of aortic stenosis. Pulmonic Valve: The pulmonic valve was normal in structure. Pulmonic valve regurgitation is not visualized. No evidence of pulmonic stenosis. Aorta: Aortic dilatation noted. There is borderline dilatation of the ascending aorta, measuring 38 mm. Venous: The inferior vena cava is normal in size with greater than 50% respiratory variability, suggesting right atrial pressure of 3 mmHg. IAS/Shunts: No atrial level shunt detected by color flow Doppler.  LEFT VENTRICLE PLAX 2D LVIDd:  PROGRESS NOTE                                                                                                                                                                                                             Patient Demographics:    [name], is a 82 y.o. female, DOB - 1940/10/23, ZOX:[id number]  Outpatient Primary MD for the patient is [name], MD    LOS - 2  Admit date - 08/06/2023    Chief Complaint  Patient presents with   Weakness       Brief Narrative (HPI from H&P)   82 y.o. female with medical history significant of HTN, T2DM, HLD, anxiety, GERD, recurrent syncope s/p loop recorder who presented with intermittent LUE weakness and paresthesia.  As per patient for past 3 to 4 months she noticed intermittent weakness and numbness of left arm from shoulder to wrist.  On arrival to ED blood pressure was high at 197/62.  MRI brain showed multiple small acute infarcts of the right cerebral hemisphere.      Subjective:   Patient in bed, appears comfortable, denies any headache, no fever, no chest pain or pressure, no shortness of breath , no abdominal pain. No new focal weakness.  Left sided weakness improving, eager to go for surgery.   Assessment  & Plan :    CVA -MRI brain showed multiple small acute infarcts in the right cerebral hemisphere -CT head and neck showed 70% stenosis of the proximal right ICA with mixed density atherosclerosis, 6.4 mm.  Projecting aneurysm of the left ICA.  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 -Discussed with neurology Dr Pearlean Brownie 08/08/23 - trial of aspirin and Plavix 3 months with PPI on board and monitor.  After 3 months Plavix alone. Her left sided deficits improving.  [facility]  saw the patient initially planned outpatient procedure however patient wanted to consider inpatient procedure which now has been arranged.   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  Seen by [facility].  DAPT and statin for now, going for carotid endarterectomy on 08/09/2023.  She is agreeable for transfusions if needed during

## 2023-08-09 NOTE — Progress Notes (Signed)
OT Cancellation Note  Patient Details Name: [name] MRN: [id number] DOB: [birth year]   Cancelled Treatment:    Reason Eval/Treat Not Completed: Patient at procedure or test/ unavailable (Pt still at vascular procedure. OT to follow-up with pt as able.)  08/09/2023  AB, OTR/L  [facility] Services  Office: [phone]  Tristan Schroeder 08/09/2023, 4:08 PM

## 2023-08-09 NOTE — Anesthesia Procedure Notes (Signed)
Arterial Line Insertion Performed by: Jakwon Gayton B, CRNA, CRNA  Patient location: Pre-op. Preanesthetic checklist: patient identified, IV checked, site marked, risks and benefits discussed, surgical consent, monitors and equipment checked, pre-op evaluation, timeout performed and anesthesia consent Lidocaine 1% used for infiltration Left, radial was placed Catheter size: 20 G Hand hygiene performed , maximum sterile barriers used  and Seldinger technique used Allen's test indicative of satisfactory collateral circulation Attempts: 1 Procedure performed without using ultrasound guided technique. Following insertion, dressing applied and Biopatch. Post procedure assessment: normal  Patient tolerated the procedure well with no immediate complications.    

## 2023-08-09 NOTE — Plan of Care (Signed)
  Problem: Education: Goal: Knowledge of disease or condition will improve Outcome: Progressing Goal: Knowledge of secondary prevention will improve (MUST DOCUMENT ALL) Outcome: Progressing Goal: Knowledge of patient specific risk factors will improve Loraine Leriche N/A or DELETE if not current risk factor) Outcome: Progressing   Problem: Ischemic Stroke/TIA Tissue Perfusion: Goal: Complications of ischemic stroke/TIA will be minimized Outcome: Progressing   Problem: Coping: Goal: Will verbalize positive feelings about self Outcome: Progressing Goal: Will identify appropriate support needs Outcome: Progressing   Problem: Health Behavior/Discharge Planning: Goal: Ability to manage health-related needs will improve Outcome: Progressing Goal: Goals will be collaboratively established with patient/family Outcome: Progressing   Problem: Self-Care: Goal: Ability to participate in self-care as condition permits will improve Outcome: Progressing Goal: Verbalization of feelings and concerns over difficulty with self-care will improve Outcome: Progressing Goal: Ability to communicate needs accurately will improve Outcome: Progressing   Problem: Nutrition: Goal: Risk of aspiration will decrease Outcome: Progressing Goal: Dietary intake will improve Outcome: Progressing   Problem: Education: Goal: Knowledge of General Education information will improve Description: Including pain rating scale, medication(s)/side effects and non-pharmacologic comfort measures Outcome: Progressing   Problem: Health Behavior/Discharge Planning: Goal: Ability to manage health-related needs will improve Outcome: Progressing   Problem: Clinical Measurements: Goal: Ability to maintain clinical measurements within normal limits will improve Outcome: Progressing Goal: Will remain free from infection Outcome: Progressing Goal: Diagnostic test results will improve Outcome: Progressing

## 2023-08-09 NOTE — Interval H&P Note (Signed)
History and Physical Interval Note:  08/09/2023 11:21 AM  [name]  has presented today for surgery, with the diagnosis of carotid artery stenosis.  The various methods of treatment have been discussed with the patient and family. After consideration of risks, benefits and other options for treatment, the patient has consented to  Procedure(s): TRANSCAROTID ARTERY REVASCULARIZATION (Right) as a surgical intervention.  The patient's history has been reviewed, patient examined, no change in status, stable for surgery.  I have reviewed the patient's chart and labs.  Questions were answered to the patient's satisfaction.     Leonie Douglas

## 2023-08-09 NOTE — Progress Notes (Signed)
  Progress Note    08/09/2023 7:20 AM * No surgery date entered *  Symptomatic right ICA stenosis  Scheduled for right TCAR with Dr. Lenell Antu today in the OR Reviewed surgery with patient and she did not have any questions Keep NPO Consent order placed  [facility] [phone] 08/09/2023 7:20 AM

## 2023-08-09 NOTE — Plan of Care (Signed)
  Problem: Education: Goal: Knowledge of disease or condition will improve 08/09/2023 0227 by Karolee Ohs, RN Outcome: Progressing 08/09/2023 0225 by Karolee Ohs, RN Outcome: Progressing Goal: Knowledge of secondary prevention will improve (MUST DOCUMENT ALL) 08/09/2023 0227 by Karolee Ohs, RN Outcome: Progressing 08/09/2023 0225 by Karolee Ohs, RN Outcome: Progressing Goal: Knowledge of patient specific risk factors will improve Loraine Leriche N/A or DELETE if not current risk factor) 08/09/2023 0227 by Karolee Ohs, RN Outcome: Progressing 08/09/2023 0225 by Karolee Ohs, RN Outcome: Progressing   Problem: Ischemic Stroke/TIA Tissue Perfusion: Goal: Complications of ischemic stroke/TIA will be minimized 08/09/2023 0227 by Karolee Ohs, RN Outcome: Progressing 08/09/2023 0225 by Karolee Ohs, RN Outcome: Progressing   Problem: Coping: Goal: Will verbalize positive feelings about self 08/09/2023 0227 by Karolee Ohs, RN Outcome: Progressing 08/09/2023 0225 by Karolee Ohs, RN Outcome: Progressing Goal: Will identify appropriate support needs 08/09/2023 0227 by Karolee Ohs, RN Outcome: Progressing 08/09/2023 0225 by Karolee Ohs, RN Outcome: Progressing   Problem: Health Behavior/Discharge Planning: Goal: Ability to manage health-related needs will improve 08/09/2023 0227 by Karolee Ohs, RN Outcome: Progressing 08/09/2023 0225 by Karolee Ohs, RN Outcome: Progressing Goal: Goals will be collaboratively established with patient/family 08/09/2023 0227 by Karolee Ohs, RN Outcome: Progressing 08/09/2023 0225 by Karolee Ohs, RN Outcome: Progressing   Problem: Self-Care: Goal: Ability to participate in self-care as condition permits will improve 08/09/2023 0227 by Karolee Ohs, RN Outcome: Progressing 08/09/2023 0225 by Karolee Ohs, RN Outcome: Progressing Goal: Verbalization of feelings and concerns over difficulty with self-care  will improve 08/09/2023 0227 by Karolee Ohs, RN Outcome: Progressing 08/09/2023 0225 by Karolee Ohs, RN Outcome: Progressing Goal: Ability to communicate needs accurately will improve 08/09/2023 0227 by Karolee Ohs, RN Outcome: Progressing 08/09/2023 0225 by Karolee Ohs, RN Outcome: Progressing   Problem: Nutrition: Goal: Risk of aspiration will decrease 08/09/2023 0227 by Karolee Ohs, RN Outcome: Progressing 08/09/2023 0225 by Karolee Ohs, RN Outcome: Progressing Goal: Dietary intake will improve Outcome: Progressing   Problem: Education: Goal: Knowledge of General Education information will improve Description: Including pain rating scale, medication(s)/side effects and non-pharmacologic comfort measures Outcome: Progressing   Problem: Health Behavior/Discharge Planning: Goal: Ability to manage health-related needs will improve Outcome: Progressing   Problem: Clinical Measurements: Goal: Ability to maintain clinical measurements within normal limits will improve Outcome: Progressing Goal: Will remain free from infection Outcome: Progressing Goal: Diagnostic test results will improve Outcome: Progressing

## 2023-08-09 NOTE — Transfer of Care (Signed)
Immediate Anesthesia Transfer of Care Note  Patient: [name]  Procedure(s) Performed: Zada Finders ARTERY REVASCULARIZATION (Right)  Patient Location: PACU  Anesthesia Type:General  Level of Consciousness: drowsy, patient cooperative, and responds to stimulation  Airway & Oxygen Therapy: Patient Spontanous Breathing and Patient connected to face mask oxygen  Post-op Assessment: Report given to RN and Post -op Vital signs reviewed and stable  Post vital signs: Reviewed and stable  Last Vitals:  Vitals Value Taken Time  BP 116/48 08/09/23 1415  Temp    Pulse 67 08/09/23 1415  Resp 19 08/09/23 1415  SpO2 92 % 08/09/23 1415  Vitals shown include unfiled device data.  Last Pain:  Vitals:   08/09/23 1024  TempSrc: Oral  PainSc: 0-No pain      Patients Stated Pain Goal: 0 (08/08/23 0936)  Complications: No notable events documented.

## 2023-08-09 NOTE — Progress Notes (Signed)
STROKE TEAM PROGRESS NOTE   BRIEF HPI [name] is a 82 y.o. female with history of HTN, DM2, HLD presenting with recurrent transient LUE weakness and numbness. She reported that she had a syncopal episode on 10/15 in which she was going to the bathroom and woke up minutes later with extreme foot pain.  She reported going to her PCP who recommended she had an x-ray which revealed a break in one of the metatarsal bones of her left foot.  Yesterday on 10/22, she stood up abruptly felt left upper extremity weakness and noticed hemianopsia along with left-sided facial numbness and numbness/tingling in her left arm.  After talking with her family she decided come to the hospital.  She had MRI brain without contrast and MR cervical spine which showed acute small infarcts in the right cerebral hemisphere including the frontal and parietal lobes.  CT angiogram showed 70% stenosis of the right ICA consistent with right MCA territory infarcts.  Severe stenosis right vertebral artery V1 segment and occlusion of the V4.  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  SIGNIFICANT HOSPITAL EVENTS 10/22: admitted to the ED MRI brain, MR cervical spine 10/23: CT angio head neck 10/24: Vascular surgery consult.  INTERIM HISTORY/SUBJECTIVE Met patient in in PACU.  She had TCAR procedure done today by Dr. Lenell Antu and surgery went well.  She is awake alert interactive and moves all 4 extremities well without weakness.  Tongue is midline.  No facial weakness.      OBJECTIVE  CBC    Component Value Date/Time   WBC 4.8 08/08/2023 0619   RBC 4.15 08/08/2023 0619   HGB 12.7 08/08/2023 0619   HGB 12.9 11/16/2021 0930   HGB 12.7 01/08/2017 1224   HCT 38.3 08/08/2023 0619   HCT 38.2 11/16/2021 0930   HCT 39.3 01/08/2017 1224   PLT 166 08/08/2023 0619   PLT 148 (L) 11/16/2021 0930   MCV 92.3 08/08/2023 0619   MCV 94 11/16/2021 0930   MCV 95.2 01/08/2017 1224   MCH 30.6 08/08/2023 0619   MCHC 33.2  08/08/2023 0619   RDW 12.9 08/08/2023 0619   RDW 13.1 11/16/2021 0930   RDW 12.9 01/08/2017 1224   LYMPHSABS 2.0 08/06/2023 1726   LYMPHSABS 2.3 01/08/2017 1224   MONOABS 0.4 08/06/2023 1726   MONOABS 0.8 01/08/2017 1224   EOSABS 0.4 08/06/2023 1726   EOSABS 0.2 01/08/2017 1224   BASOSABS 0.0 08/06/2023 1726   BASOSABS 0.0 01/08/2017 1224    BMET    Component Value Date/Time   NA 137 08/08/2023 0619   NA 141 08/05/2023 1157   NA 141 01/08/2017 1224   K 3.9 08/08/2023 0619   K 5.3 (H) 01/08/2017 1224   CL 105 08/08/2023 0619   CL 105 10/24/2012 1407   CO2 23 08/08/2023 0619   CO2 32 (H) 01/08/2017 1224   GLUCOSE 114 (H) 08/08/2023 0619   GLUCOSE 81 01/08/2017 1224   GLUCOSE 87 10/24/2012 1407   BUN 16 08/08/2023 0619   BUN 18 08/05/2023 1157   BUN 16.6 01/08/2017 1224   CREATININE 1.02 (H) 08/08/2023 0619   CREATININE 1.0 01/08/2017 1224   CALCIUM 9.1 08/08/2023 0619   CALCIUM 10.4 01/08/2017 1224   EGFR 64 08/05/2023 1157   GFRNONAA 56 (L) 08/08/2023 0619    IMAGING past 24 hours No results found.  Vitals:   08/09/23 1430 08/09/23 1445 08/09/23 1500 08/09/23 1600  BP: (!) 105/48 (!) 123/55  125/66 133/64  Pulse: 65 66 67 73  Resp: 17 17 19 20   Temp:      TempSrc:      SpO2: 94% 96% 95% 94%  Weight:      Height:       PHYSICAL EXAM General: Alert, well-nourished, well-developed patient in no acute distress Psych: Mood and affect appropriate for situation CV: Regular rate and rhythm on monitor Respiratory:  Regular, unlabored respirations on room air GI: Abdomen soft and nontender  NEURO:  Mental Status: AA&Ox3, patient is able to give clear and coherent history Speech/Language: speech is without dysarthria or aphasia.  Naming, repetition, fluency, and comprehension intact.  Cranial Nerves:  II: PERRL. Visual field deficits no longer present on L side. Pt reports blurry vision, but was able to accurately report digits. III, IV, VI: EOMI. Eyelids elevate  symmetrically.  V: Sensory deficit on L has reduced. Closer to symmetric. VII: Mild left nasolabial fold flattening. VIII: Hearing diminished on left side.  IX, X: Palate elevates symmetrically. Phonation is normal.  XB:JYNWGNFA shrug on right limited by R shoulder injury. 5/5 on L XII: tongue is midline without fasciculations. Motor: 5/5 strength to all muscle groups tested bilaterally. Tone: is normal and bulk is normal Sensation- Intact to light touch bilaterally. Extinction absent to light touch to DSS.   Coordination: Left-sided difficulties in FTN still present. Mild LUE drift.  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  CTA head & neck A 70% stenosis of the proximal right ICA due to mixed density atherosclerosis. This is a likely source of the previously demonstrated right MCA territory embolic infarcts.  Vascular surgery plans to schedule R carotid revascularization 10/25 MRI  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  2D Echo EF 65-70%, LA moderately dilated; RA mildly dilated LDL 112 HgbA1c 6.8 VTE prophylaxis - Lovenox 40 mg subcutaneous  No antithrombotic prior to admission, now on aspirin 81 mg daily and clopidogrel 75 mg daily for 3 weeks, then plavix alone due to patient intolerance of long-term aspirin therapy.  (Patient has hx of GI bleeds with daily aspirin) Therapy recommendations:  Pending Disposition: TBD Pt has a cardiac arrhythmia and experiences sensations of heart palpitations on a regular basis.  Cardiac Loop recorder last transmitted data on 10/20, and showed no abnormal rhythms consistent with her syncopal episode on 10/15.  Hx of Stroke/TIA NA  Hypertension Home meds:  losartan Stable, hold for permissive htn (hydralazine as emergent antihypertensive) Blood Pressure Goal: BP less  than 180/105   Hyperlipidemia Home meds:  rosuvastatin 40, resumed in hospital LDL 112, goal < 70 Add ezetimibe 10 mg Continue statin and ezetimibe at discharge  Diabetes type II Controlled Home meds:  metformin HgbA1c 6.8, goal < 7.0 CBGs SSI  Other Stroke Risk Factors Family hx heart disease (mother), Fam hx stroke (brother)   Other Active Problems, managed per primary Fracture, Left 5th metatarsal Depression - continue sertraline GERD - Continue pantoprazole    Hospital day # 2 Patient underwent carotid revascularization procedure and is doing well.  Continue close observation and strict blood pressure control as per.  Post TCAR protocol..  Continue  aggressive risk factor modification.  Long discussion with patient and answered questions.  Stroke team will sign off.  Kindly call for questions.  Discussed with Dr. Thedore Mins greater than 50% time during  this 35-minute visit was spent on counseling and coordination of care about his symptomatic carotid stenosis and discussion about treatment options and need for revascularization and answering questions.  Delia Heady, MD Medical Director [facility] Pager: [phone] 08/09/2023 4:18 PM   To contact Stroke Continuity provider, please refer to [email or web address]. After hours, contact General Neurology

## 2023-08-10 ENCOUNTER — Encounter (HOSPITAL_COMMUNITY): Payer: Self-pay | Admitting: Vascular Surgery

## 2023-08-10 DIAGNOSIS — I6521 Occlusion and stenosis of right carotid artery: Secondary | ICD-10-CM | POA: Diagnosis not present

## 2023-08-10 DIAGNOSIS — I63421 Cerebral infarction due to embolism of right anterior cerebral artery: Secondary | ICD-10-CM | POA: Diagnosis not present

## 2023-08-10 DIAGNOSIS — I10 Essential (primary) hypertension: Secondary | ICD-10-CM | POA: Diagnosis not present

## 2023-08-10 LAB — BASIC METABOLIC PANEL
Anion gap: 10 (ref 5–15)
BUN: 13 mg/dL (ref 8–23)
CO2: 22 mmol/L (ref 22–32)
Calcium: 8.7 mg/dL — ABNORMAL LOW (ref 8.9–10.3)
Chloride: 105 mmol/L (ref 98–111)
Creatinine, Ser: 1.06 mg/dL — ABNORMAL HIGH (ref 0.44–1.00)
GFR, Estimated: 53 mL/min — ABNORMAL LOW (ref >60)
Glucose, Bld: 173 mg/dL — ABNORMAL HIGH (ref 70–99)
Potassium: 4 mmol/L (ref 3.5–5.1)
Sodium: 137 mmol/L (ref 135–145)

## 2023-08-10 LAB — PROCALCITONIN: Procalcitonin: <0.1 ng/mL

## 2023-08-10 LAB — CBC WITH DIFFERENTIAL/PLATELET
Abs Immature Granulocytes: 0.01 10*3/uL (ref 0.00–0.07)
Basophils Absolute: 0 10*3/uL (ref 0.0–0.1)
Basophils Relative: 0 %
Eosinophils Absolute: 0 10*3/uL (ref 0.0–0.5)
Eosinophils Relative: 0 %
HCT: 34.9 % — ABNORMAL LOW (ref 36.0–46.0)
Hemoglobin: 11.6 g/dL — ABNORMAL LOW (ref 12.0–15.0)
Immature Granulocytes: 0 %
Lymphocytes Relative: 24 %
Lymphs Abs: 1.7 10*3/uL (ref 0.7–4.0)
MCH: 31.4 pg (ref 26.0–34.0)
MCHC: 33.2 g/dL (ref 30.0–36.0)
MCV: 94.3 fL (ref 80.0–100.0)
Monocytes Absolute: 0.6 10*3/uL (ref 0.1–1.0)
Monocytes Relative: 9 %
Neutro Abs: 4.9 10*3/uL (ref 1.7–7.7)
Neutrophils Relative %: 67 %
Platelets: 167 10*3/uL (ref 150–400)
RBC: 3.7 MIL/uL — ABNORMAL LOW (ref 3.87–5.11)
RDW: 12.9 % (ref 11.5–15.5)
WBC: 7.3 10*3/uL (ref 4.0–10.5)
nRBC: 0 % (ref 0.0–0.2)

## 2023-08-10 LAB — GLUCOSE, CAPILLARY
Glucose-Capillary: 162 mg/dL — ABNORMAL HIGH (ref 70–99)
Glucose-Capillary: 122 mg/dL — ABNORMAL HIGH (ref 70–99)
Glucose-Capillary: 166 mg/dL — ABNORMAL HIGH (ref 70–99)
Glucose-Capillary: 200 mg/dL — ABNORMAL HIGH (ref 70–99)

## 2023-08-10 LAB — LIPID PANEL
Cholesterol: 135 mg/dL (ref 0–200)
HDL: 58 mg/dL (ref >40)
LDL Cholesterol: 71 mg/dL (ref 0–99)
Total CHOL/HDL Ratio: 2.3 {ratio}
Triglycerides: 30 mg/dL (ref <150)
VLDL: 6 mg/dL (ref 0–40)

## 2023-08-10 LAB — MAGNESIUM: Magnesium: 2.1 mg/dL (ref 1.7–2.4)

## 2023-08-10 LAB — BRAIN NATRIURETIC PEPTIDE: B Natriuretic Peptide: 482.1 pg/mL — ABNORMAL HIGH (ref 0.0–100.0)

## 2023-08-10 NOTE — Progress Notes (Addendum)
Progress Note    08/10/2023 9:05 AM 1 Day Post-Op  Subjective: Doing well this morning eating breakfast    Vitals:   08/10/23 0407 08/10/23 0822  BP: (!) 122/47 (!) 129/45  Pulse: 65   Resp: 17 (!) 22  Temp: 98.2 F (36.8 C) 98.9 F (37.2 C)  SpO2: 95% 95%    Physical Exam: General: Eating breakfast, A&O x 4 Cardiac:  regular Lungs:  nonlabored Incisions:  right neck incision dressed and dry. Left groin cath site soft with dry dressing Extremities:  palpable radial pulses bilaterally. LUE grip 4/5, RUE grip 5/5  CBC    Component Value Date/Time   WBC 7.3 08/10/2023 0545   RBC 3.70 (L) 08/10/2023 0545   HGB 11.6 (L) 08/10/2023 0545   HGB 12.9 11/16/2021 0930   HGB 12.7 01/08/2017 1224   HCT 34.9 (L) 08/10/2023 0545   HCT 38.2 11/16/2021 0930   HCT 39.3 01/08/2017 1224   PLT 167 08/10/2023 0545   PLT 148 (L) 11/16/2021 0930   MCV 94.3 08/10/2023 0545   MCV 94 11/16/2021 0930   MCV 95.2 01/08/2017 1224   MCH 31.4 08/10/2023 0545   MCHC 33.2 08/10/2023 0545   RDW 12.9 08/10/2023 0545   RDW 13.1 11/16/2021 0930   RDW 12.9 01/08/2017 1224   LYMPHSABS 1.7 08/10/2023 0545   LYMPHSABS 2.3 01/08/2017 1224   MONOABS 0.6 08/10/2023 0545   MONOABS 0.8 01/08/2017 1224   EOSABS 0.0 08/10/2023 0545   EOSABS 0.2 01/08/2017 1224   BASOSABS 0.0 08/10/2023 0545   BASOSABS 0.0 01/08/2017 1224    BMET    Component Value Date/Time   NA 137 08/10/2023 0545   NA 141 08/05/2023 1157   NA 141 01/08/2017 1224   K 4.0 08/10/2023 0545   K 5.3 (H) 01/08/2017 1224   CL 105 08/10/2023 0545   CL 105 10/24/2012 1407   CO2 22 08/10/2023 0545   CO2 32 (H) 01/08/2017 1224   GLUCOSE 173 (H) 08/10/2023 0545   GLUCOSE 81 01/08/2017 1224   GLUCOSE 87 10/24/2012 1407   BUN 13 08/10/2023 0545   BUN 18 08/05/2023 1157   BUN 16.6 01/08/2017 1224   CREATININE 1.06 (H) 08/10/2023 0545   CREATININE 1.0 01/08/2017 1224   CALCIUM 8.7 (L) 08/10/2023 0545   CALCIUM 10.4 01/08/2017 1224    GFRNONAA 53 (L) 08/10/2023 0545   GFRAA >60 07/04/2018 1357    INR    Component Value Date/Time   INR 1.0 08/06/2023 1726     Intake/Output Summary (Last 24 hours) at 08/10/2023 0905 Last data filed at 08/09/2023 2002 Gross per 24 hour  Intake 1924.83 ml  Output 520 ml  Net 1404.83 ml      Assessment/Plan:  82 y.o. female is 1 day post op, s/p: right TCAR    -Patient is feeling good this morning.  Minimal soreness at right neck incision site overnight. Swallowing without difficulty -Right sided neck incision dressed and dry.  Dressing can come down tomorrow -Left groin cath site dressed and dry without hematoma -No further neurological events.  Minimal residual left upper extremity weakness -Hemodynamically stable.  Minimal blood loss from surgery yesterday, hemoglobin this morning at 11.6 -Continue ASA, Plavix, and statin.  Okay for discharge today from vascular standpoint.  Will arrange follow-up with our office in 4 weeks   Loel Dubonnet, [facility] [phone] 08/10/2023 9:05 AM  VASCULAR STAFF ADDENDUM: I have independently interviewed and examined the patient. I agree  with the above.  No headache. Left-sided deficit stable from preop  Victorino Sparrow MD [facility] Phone Number: [phone] 08/10/2023 9:25 AM

## 2023-08-10 NOTE — Progress Notes (Signed)
PT Cancellation Note  Patient Details Name: [name] MRN: [id number] DOB: 1941-07-07   Cancelled Treatment:    Reason Eval/Treat Not Completed: Other (comment) (got to the room just after pt back in bed from waiting for therapy.)  Will see tomorrow. 08/10/2023  Jacinto Halim., PT Acute Rehabilitation Services [phone]  (office)   Eliseo Gum Jhonathan Desroches 08/10/2023, 5:31 PM

## 2023-08-10 NOTE — Progress Notes (Signed)
PHARMACIST LIPID MONITORING   [name] is a 82 y.o. female admitted on 08/06/2023 with CVA.  Pharmacy has been consulted to optimize lipid-lowering therapy with the indication of secondary prevention for clinical ASCVD.  Recent Labs:  Lipid Panel (last 6 months):   Lab Results  Component Value Date   CHOL 135 08/10/2023   TRIG 30 08/10/2023   HDL 58 08/10/2023   CHOLHDL 2.3 08/10/2023   VLDL 6 08/10/2023   LDLCALC 71 08/10/2023    Hepatic function panel (last 6 months):   Lab Results  Component Value Date   AST 33 08/06/2023   ALT 20 08/06/2023   ALKPHOS 69 08/06/2023   BILITOT 0.7 08/06/2023    SCr (since admission):   Serum creatinine: 1.06 mg/dL (H) 40/98/11 9147 Estimated creatinine clearance: 55.5 mL/min (A)  Current therapy and lipid therapy tolerance Current lipid-lowering therapy: rosuvastatin 40mg  daily and ezetimibe 10mg  daily Previous lipid-lowering therapies (if applicable): atorvastatin Documented or reported allergies or intolerances to lipid-lowering therapies (if applicable): previous chart notes state muscle cramping on atorvastatin  Assessment:   On rosuvastatin 40mg  daily prior to admission. Ezetimibe initiated this admission due to LDL 112 on 10/23. Lipid panel this AM resulted with LDL 71. Unclear which is the true value. Either way, LDL is > 70 therefore addition of ezetimibe to high-intensity statin is appropriate. No other changes to therapy at this time.  Plan:    1.Statin intensity (high intensity recommended for all patients regardless of the LDL):  No statin changes. The patient is already on a high intensity statin.  2.Add ezetimibe (if any one of the following):   On a high intensity statin with LDL > 70.  3.Refer to lipid clinic:   No  4.Follow-up with:  Primary care provider - [name], MD  5.Follow-up labs after discharge:  Changes in lipid therapy were made. Check a lipid panel in 8-12 weeks then annually.     Nicole Kindred, PharmD PGY1 Pharmacy Resident 08/10/2023 6:50 AM

## 2023-08-10 NOTE — Progress Notes (Signed)
PROGRESS NOTE                                                                                                                                                                                                             Patient Demographics:    [name], is a 82 y.o. female, DOB - [birth year], DGU:[id number]  Outpatient Primary MD for the patient is [name], MD    LOS - 3  Admit date - 08/06/2023    Brief Narrative (HPI from H&P)   82 y.o. female with medical history significant of HTN, T2DM, HLD, anxiety, GERD, recurrent syncope s/p loop recorder who presented with intermittent LUE weakness and paresthesia.  As per patient for past 3 to 4 months she noticed intermittent weakness and numbness of left arm from shoulder to wrist.  On arrival to ED blood pressure was high at 197/62.  MRI brain showed multiple small acute infarcts of the right cerebral hemisphere.      Subjective:   Patient denies any complaints this morning except for some pain in the left foot where she has a known fracture.  The left-sided weakness appears to have improved.  Husband is at the bedside.     Assessment  & Plan :    Acute CVA -MRI brain showed multiple small acute infarcts in the right cerebral hemisphere -CT head and neck showed 70% stenosis of the proximal right ICA with mixed density atherosclerosis, 6.4 mm.  Projecting aneurysm of the left ICA.  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 Seen by neurology.  Plan is for aspirin and Plavix for 3 weeks followed by Plavix alone.  Patient has intolerant to long-term aspirin use and has had a history of GI bleeds with daily aspirin. PT and OT is following.  Plan is for home health when medically stable  for discharge.  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  See below. LDL 112. Continue statin.  Ezetimibe was added. HbA1c 6.8.  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   Seen by vascular surgery.  Underwent TCAR on 10/24.     Essential Hypertension Permissive hypertension was allowed.  9.1 8.7*    --------------------------------------------------------------------------------------------------------------- Lab Results  Component Value Date   CHOL 135 08/10/2023   HDL 58 08/10/2023   LDLCALC 71 08/10/2023   TRIG 30 08/10/2023   CHOLHDL 2.3 08/10/2023    Lab Results  Component Value Date   HGBA1C 6.8 (H) 08/07/2023   Micro Results Recent Results (from the past 240 hour(s))  Surgical pcr screen     Status: None   Collection Time: 08/08/23 10:00 PM   Specimen: Nasal Mucosa; Nasal Swab  Result Value Ref Range Status   MRSA, PCR NEGATIVE NEGATIVE Final   Staphylococcus aureus NEGATIVE NEGATIVE Final    Comment: (NOTE) The Xpert SA Assay (FDA approved  for NASAL specimens in patients 95 years of age and older), is one component of a comprehensive surveillance program. It is not intended to diagnose infection nor to guide or monitor treatment. Performed at [facility], [address], [place], [place] [address]     Radiology Reports DG C-Arm [phone] Min  Result Date: 08/09/2023 CLINICAL DATA:  Transcarotid artery revascularization EXAM: DG C-ARM 1-60 MIN CONTRAST:  Not reported. FLUOROSCOPY: Fluoroscopy Time:  4:09 Radiation Exposure Index (if provided by the fluoroscopic device): 27.59 mGy air kerma COMPARISON:  None Available. FINDINGS: Intraoperative fluoroscopic assistance is provided for transcarotid arterial revascularization. Please see operative report for procedural details. IMPRESSION: Intraoperative fluoroscopic assistance is provided for transcarotid arterial revascularization. Please see operative report for procedural details. Electronically Signed   By: Jearld Lesch M.D.   On: 08/09/2023 17:32   ECHOCARDIOGRAM COMPLETE  Result Date: 08/07/2023    ECHOCARDIOGRAM REPORT   Patient Name:   [name] Date of Exam: 08/07/2023 Medical Rec #:  [id number]   Height:       71.0 in Accession #:    [id number]  Weight:       215.0 lb Date of Birth:  [birth year]  BSA:          2.174 m Patient Age:    81 years    BP:           172/57 mmHg Patient Gender: F           HR:           63 bpm. Exam Location:  Inpatient Procedure: 2D Echo, Cardiac Doppler and Color Doppler Indications:    Stroke I63.9  History:        Patient has prior history of Echocardiogram examinations, most                 recent 10/22/2022. Arrythmias:Bradycardia; Risk Factors:Diabetes,                 Dyslipidemia, Hypertension and Sleep Apnea.  Sonographer:    Eulah Pont RDCS Referring Phys: [id number] CHING T TU IMPRESSIONS  1. Left ventricular ejection fraction, by estimation, is 65 to 70%. The left ventricle has normal function. The left ventricle has no regional wall  motion abnormalities. There is mild concentric left ventricular hypertrophy. Left ventricular diastolic parameters are consistent with Grade I diastolic dysfunction (impaired relaxation).  2. Right ventricular systolic function is normal. The right ventricular size is normal.  3. Left atrial size was moderately dilated.  4. Right atrial size was mildly dilated.  5. The mitral valve is normal in structure. Trivial mitral valve regurgitation. No evidence of mitral stenosis.  6. The aortic valve is tricuspid. There is mild calcification of the aortic valve. Aortic valve regurgitation is trivial. Aortic valve sclerosis/calcification is  9.1 8.7*    --------------------------------------------------------------------------------------------------------------- Lab Results  Component Value Date   CHOL 135 08/10/2023   HDL 58 08/10/2023   LDLCALC 71 08/10/2023   TRIG 30 08/10/2023   CHOLHDL 2.3 08/10/2023    Lab Results  Component Value Date   HGBA1C 6.8 (H) 08/07/2023   Micro Results Recent Results (from the past 240 hour(s))  Surgical pcr screen     Status: None   Collection Time: 08/08/23 10:00 PM   Specimen: Nasal Mucosa; Nasal Swab  Result Value Ref Range Status   MRSA, PCR NEGATIVE NEGATIVE Final   Staphylococcus aureus NEGATIVE NEGATIVE Final    Comment: (NOTE) The Xpert SA Assay (FDA approved  for NASAL specimens in patients 95 years of age and older), is one component of a comprehensive surveillance program. It is not intended to diagnose infection nor to guide or monitor treatment. Performed at [facility], [address], [place], [place] [address]     Radiology Reports DG C-Arm [phone] Min  Result Date: 08/09/2023 CLINICAL DATA:  Transcarotid artery revascularization EXAM: DG C-ARM 1-60 MIN CONTRAST:  Not reported. FLUOROSCOPY: Fluoroscopy Time:  4:09 Radiation Exposure Index (if provided by the fluoroscopic device): 27.59 mGy air kerma COMPARISON:  None Available. FINDINGS: Intraoperative fluoroscopic assistance is provided for transcarotid arterial revascularization. Please see operative report for procedural details. IMPRESSION: Intraoperative fluoroscopic assistance is provided for transcarotid arterial revascularization. Please see operative report for procedural details. Electronically Signed   By: Jearld Lesch M.D.   On: 08/09/2023 17:32   ECHOCARDIOGRAM COMPLETE  Result Date: 08/07/2023    ECHOCARDIOGRAM REPORT   Patient Name:   [name] Date of Exam: 08/07/2023 Medical Rec #:  [id number]   Height:       71.0 in Accession #:    [id number]  Weight:       215.0 lb Date of Birth:  [birth year]  BSA:          2.174 m Patient Age:    81 years    BP:           172/57 mmHg Patient Gender: F           HR:           63 bpm. Exam Location:  Inpatient Procedure: 2D Echo, Cardiac Doppler and Color Doppler Indications:    Stroke I63.9  History:        Patient has prior history of Echocardiogram examinations, most                 recent 10/22/2022. Arrythmias:Bradycardia; Risk Factors:Diabetes,                 Dyslipidemia, Hypertension and Sleep Apnea.  Sonographer:    Eulah Pont RDCS Referring Phys: [id number] CHING T TU IMPRESSIONS  1. Left ventricular ejection fraction, by estimation, is 65 to 70%. The left ventricle has normal function. The left ventricle has no regional wall  motion abnormalities. There is mild concentric left ventricular hypertrophy. Left ventricular diastolic parameters are consistent with Grade I diastolic dysfunction (impaired relaxation).  2. Right ventricular systolic function is normal. The right ventricular size is normal.  3. Left atrial size was moderately dilated.  4. Right atrial size was mildly dilated.  5. The mitral valve is normal in structure. Trivial mitral valve regurgitation. No evidence of mitral stenosis.  6. The aortic valve is tricuspid. There is mild calcification of the aortic valve. Aortic valve regurgitation is trivial. Aortic valve sclerosis/calcification is  9.1 8.7*    --------------------------------------------------------------------------------------------------------------- Lab Results  Component Value Date   CHOL 135 08/10/2023   HDL 58 08/10/2023   LDLCALC 71 08/10/2023   TRIG 30 08/10/2023   CHOLHDL 2.3 08/10/2023    Lab Results  Component Value Date   HGBA1C 6.8 (H) 08/07/2023   Micro Results Recent Results (from the past 240 hour(s))  Surgical pcr screen     Status: None   Collection Time: 08/08/23 10:00 PM   Specimen: Nasal Mucosa; Nasal Swab  Result Value Ref Range Status   MRSA, PCR NEGATIVE NEGATIVE Final   Staphylococcus aureus NEGATIVE NEGATIVE Final    Comment: (NOTE) The Xpert SA Assay (FDA approved  for NASAL specimens in patients 95 years of age and older), is one component of a comprehensive surveillance program. It is not intended to diagnose infection nor to guide or monitor treatment. Performed at [facility], [address], [place], [place] [address]     Radiology Reports DG C-Arm [phone] Min  Result Date: 08/09/2023 CLINICAL DATA:  Transcarotid artery revascularization EXAM: DG C-ARM 1-60 MIN CONTRAST:  Not reported. FLUOROSCOPY: Fluoroscopy Time:  4:09 Radiation Exposure Index (if provided by the fluoroscopic device): 27.59 mGy air kerma COMPARISON:  None Available. FINDINGS: Intraoperative fluoroscopic assistance is provided for transcarotid arterial revascularization. Please see operative report for procedural details. IMPRESSION: Intraoperative fluoroscopic assistance is provided for transcarotid arterial revascularization. Please see operative report for procedural details. Electronically Signed   By: Jearld Lesch M.D.   On: 08/09/2023 17:32   ECHOCARDIOGRAM COMPLETE  Result Date: 08/07/2023    ECHOCARDIOGRAM REPORT   Patient Name:   [name] Date of Exam: 08/07/2023 Medical Rec #:  [id number]   Height:       71.0 in Accession #:    [id number]  Weight:       215.0 lb Date of Birth:  [birth year]  BSA:          2.174 m Patient Age:    81 years    BP:           172/57 mmHg Patient Gender: F           HR:           63 bpm. Exam Location:  Inpatient Procedure: 2D Echo, Cardiac Doppler and Color Doppler Indications:    Stroke I63.9  History:        Patient has prior history of Echocardiogram examinations, most                 recent 10/22/2022. Arrythmias:Bradycardia; Risk Factors:Diabetes,                 Dyslipidemia, Hypertension and Sleep Apnea.  Sonographer:    Eulah Pont RDCS Referring Phys: [id number] CHING T TU IMPRESSIONS  1. Left ventricular ejection fraction, by estimation, is 65 to 70%. The left ventricle has normal function. The left ventricle has no regional wall  motion abnormalities. There is mild concentric left ventricular hypertrophy. Left ventricular diastolic parameters are consistent with Grade I diastolic dysfunction (impaired relaxation).  2. Right ventricular systolic function is normal. The right ventricular size is normal.  3. Left atrial size was moderately dilated.  4. Right atrial size was mildly dilated.  5. The mitral valve is normal in structure. Trivial mitral valve regurgitation. No evidence of mitral stenosis.  6. The aortic valve is tricuspid. There is mild calcification of the aortic valve. Aortic valve regurgitation is trivial. Aortic valve sclerosis/calcification is  9.1 8.7*    --------------------------------------------------------------------------------------------------------------- Lab Results  Component Value Date   CHOL 135 08/10/2023   HDL 58 08/10/2023   LDLCALC 71 08/10/2023   TRIG 30 08/10/2023   CHOLHDL 2.3 08/10/2023    Lab Results  Component Value Date   HGBA1C 6.8 (H) 08/07/2023   Micro Results Recent Results (from the past 240 hour(s))  Surgical pcr screen     Status: None   Collection Time: 08/08/23 10:00 PM   Specimen: Nasal Mucosa; Nasal Swab  Result Value Ref Range Status   MRSA, PCR NEGATIVE NEGATIVE Final   Staphylococcus aureus NEGATIVE NEGATIVE Final    Comment: (NOTE) The Xpert SA Assay (FDA approved  for NASAL specimens in patients 95 years of age and older), is one component of a comprehensive surveillance program. It is not intended to diagnose infection nor to guide or monitor treatment. Performed at [facility], [address], [place], [place] [address]     Radiology Reports DG C-Arm [phone] Min  Result Date: 08/09/2023 CLINICAL DATA:  Transcarotid artery revascularization EXAM: DG C-ARM 1-60 MIN CONTRAST:  Not reported. FLUOROSCOPY: Fluoroscopy Time:  4:09 Radiation Exposure Index (if provided by the fluoroscopic device): 27.59 mGy air kerma COMPARISON:  None Available. FINDINGS: Intraoperative fluoroscopic assistance is provided for transcarotid arterial revascularization. Please see operative report for procedural details. IMPRESSION: Intraoperative fluoroscopic assistance is provided for transcarotid arterial revascularization. Please see operative report for procedural details. Electronically Signed   By: Jearld Lesch M.D.   On: 08/09/2023 17:32   ECHOCARDIOGRAM COMPLETE  Result Date: 08/07/2023    ECHOCARDIOGRAM REPORT   Patient Name:   [name] Date of Exam: 08/07/2023 Medical Rec #:  [id number]   Height:       71.0 in Accession #:    [id number]  Weight:       215.0 lb Date of Birth:  [birth year]  BSA:          2.174 m Patient Age:    81 years    BP:           172/57 mmHg Patient Gender: F           HR:           63 bpm. Exam Location:  Inpatient Procedure: 2D Echo, Cardiac Doppler and Color Doppler Indications:    Stroke I63.9  History:        Patient has prior history of Echocardiogram examinations, most                 recent 10/22/2022. Arrythmias:Bradycardia; Risk Factors:Diabetes,                 Dyslipidemia, Hypertension and Sleep Apnea.  Sonographer:    Eulah Pont RDCS Referring Phys: [id number] CHING T TU IMPRESSIONS  1. Left ventricular ejection fraction, by estimation, is 65 to 70%. The left ventricle has normal function. The left ventricle has no regional wall  motion abnormalities. There is mild concentric left ventricular hypertrophy. Left ventricular diastolic parameters are consistent with Grade I diastolic dysfunction (impaired relaxation).  2. Right ventricular systolic function is normal. The right ventricular size is normal.  3. Left atrial size was moderately dilated.  4. Right atrial size was mildly dilated.  5. The mitral valve is normal in structure. Trivial mitral valve regurgitation. No evidence of mitral stenosis.  6. The aortic valve is tricuspid. There is mild calcification of the aortic valve. Aortic valve regurgitation is trivial. Aortic valve sclerosis/calcification is  9.1 8.7*    --------------------------------------------------------------------------------------------------------------- Lab Results  Component Value Date   CHOL 135 08/10/2023   HDL 58 08/10/2023   LDLCALC 71 08/10/2023   TRIG 30 08/10/2023   CHOLHDL 2.3 08/10/2023    Lab Results  Component Value Date   HGBA1C 6.8 (H) 08/07/2023   Micro Results Recent Results (from the past 240 hour(s))  Surgical pcr screen     Status: None   Collection Time: 08/08/23 10:00 PM   Specimen: Nasal Mucosa; Nasal Swab  Result Value Ref Range Status   MRSA, PCR NEGATIVE NEGATIVE Final   Staphylococcus aureus NEGATIVE NEGATIVE Final    Comment: (NOTE) The Xpert SA Assay (FDA approved  for NASAL specimens in patients 95 years of age and older), is one component of a comprehensive surveillance program. It is not intended to diagnose infection nor to guide or monitor treatment. Performed at [facility], [address], [place], [place] [address]     Radiology Reports DG C-Arm [phone] Min  Result Date: 08/09/2023 CLINICAL DATA:  Transcarotid artery revascularization EXAM: DG C-ARM 1-60 MIN CONTRAST:  Not reported. FLUOROSCOPY: Fluoroscopy Time:  4:09 Radiation Exposure Index (if provided by the fluoroscopic device): 27.59 mGy air kerma COMPARISON:  None Available. FINDINGS: Intraoperative fluoroscopic assistance is provided for transcarotid arterial revascularization. Please see operative report for procedural details. IMPRESSION: Intraoperative fluoroscopic assistance is provided for transcarotid arterial revascularization. Please see operative report for procedural details. Electronically Signed   By: Jearld Lesch M.D.   On: 08/09/2023 17:32   ECHOCARDIOGRAM COMPLETE  Result Date: 08/07/2023    ECHOCARDIOGRAM REPORT   Patient Name:   [name] Date of Exam: 08/07/2023 Medical Rec #:  [id number]   Height:       71.0 in Accession #:    [id number]  Weight:       215.0 lb Date of Birth:  [birth year]  BSA:          2.174 m Patient Age:    81 years    BP:           172/57 mmHg Patient Gender: F           HR:           63 bpm. Exam Location:  Inpatient Procedure: 2D Echo, Cardiac Doppler and Color Doppler Indications:    Stroke I63.9  History:        Patient has prior history of Echocardiogram examinations, most                 recent 10/22/2022. Arrythmias:Bradycardia; Risk Factors:Diabetes,                 Dyslipidemia, Hypertension and Sleep Apnea.  Sonographer:    Eulah Pont RDCS Referring Phys: [id number] CHING T TU IMPRESSIONS  1. Left ventricular ejection fraction, by estimation, is 65 to 70%. The left ventricle has normal function. The left ventricle has no regional wall  motion abnormalities. There is mild concentric left ventricular hypertrophy. Left ventricular diastolic parameters are consistent with Grade I diastolic dysfunction (impaired relaxation).  2. Right ventricular systolic function is normal. The right ventricular size is normal.  3. Left atrial size was moderately dilated.  4. Right atrial size was mildly dilated.  5. The mitral valve is normal in structure. Trivial mitral valve regurgitation. No evidence of mitral stenosis.  6. The aortic valve is tricuspid. There is mild calcification of the aortic valve. Aortic valve regurgitation is trivial. Aortic valve sclerosis/calcification is  9.1 8.7*    --------------------------------------------------------------------------------------------------------------- Lab Results  Component Value Date   CHOL 135 08/10/2023   HDL 58 08/10/2023   LDLCALC 71 08/10/2023   TRIG 30 08/10/2023   CHOLHDL 2.3 08/10/2023    Lab Results  Component Value Date   HGBA1C 6.8 (H) 08/07/2023   Micro Results Recent Results (from the past 240 hour(s))  Surgical pcr screen     Status: None   Collection Time: 08/08/23 10:00 PM   Specimen: Nasal Mucosa; Nasal Swab  Result Value Ref Range Status   MRSA, PCR NEGATIVE NEGATIVE Final   Staphylococcus aureus NEGATIVE NEGATIVE Final    Comment: (NOTE) The Xpert SA Assay (FDA approved  for NASAL specimens in patients 95 years of age and older), is one component of a comprehensive surveillance program. It is not intended to diagnose infection nor to guide or monitor treatment. Performed at [facility], [address], [place], [place] [address]     Radiology Reports DG C-Arm [phone] Min  Result Date: 08/09/2023 CLINICAL DATA:  Transcarotid artery revascularization EXAM: DG C-ARM 1-60 MIN CONTRAST:  Not reported. FLUOROSCOPY: Fluoroscopy Time:  4:09 Radiation Exposure Index (if provided by the fluoroscopic device): 27.59 mGy air kerma COMPARISON:  None Available. FINDINGS: Intraoperative fluoroscopic assistance is provided for transcarotid arterial revascularization. Please see operative report for procedural details. IMPRESSION: Intraoperative fluoroscopic assistance is provided for transcarotid arterial revascularization. Please see operative report for procedural details. Electronically Signed   By: Jearld Lesch M.D.   On: 08/09/2023 17:32   ECHOCARDIOGRAM COMPLETE  Result Date: 08/07/2023    ECHOCARDIOGRAM REPORT   Patient Name:   [name] Date of Exam: 08/07/2023 Medical Rec #:  [id number]   Height:       71.0 in Accession #:    [id number]  Weight:       215.0 lb Date of Birth:  [birth year]  BSA:          2.174 m Patient Age:    81 years    BP:           172/57 mmHg Patient Gender: F           HR:           63 bpm. Exam Location:  Inpatient Procedure: 2D Echo, Cardiac Doppler and Color Doppler Indications:    Stroke I63.9  History:        Patient has prior history of Echocardiogram examinations, most                 recent 10/22/2022. Arrythmias:Bradycardia; Risk Factors:Diabetes,                 Dyslipidemia, Hypertension and Sleep Apnea.  Sonographer:    Eulah Pont RDCS Referring Phys: [id number] CHING T TU IMPRESSIONS  1. Left ventricular ejection fraction, by estimation, is 65 to 70%. The left ventricle has normal function. The left ventricle has no regional wall  motion abnormalities. There is mild concentric left ventricular hypertrophy. Left ventricular diastolic parameters are consistent with Grade I diastolic dysfunction (impaired relaxation).  2. Right ventricular systolic function is normal. The right ventricular size is normal.  3. Left atrial size was moderately dilated.  4. Right atrial size was mildly dilated.  5. The mitral valve is normal in structure. Trivial mitral valve regurgitation. No evidence of mitral stenosis.  6. The aortic valve is tricuspid. There is mild calcification of the aortic valve. Aortic valve regurgitation is trivial. Aortic valve sclerosis/calcification is  PROGRESS NOTE                                                                                                                                                                                                             Patient Demographics:    [name], is a 82 y.o. female, DOB - [birth year], DGU:[id number]  Outpatient Primary MD for the patient is [name], MD    LOS - 3  Admit date - 08/06/2023    Brief Narrative (HPI from H&P)   82 y.o. female with medical history significant of HTN, T2DM, HLD, anxiety, GERD, recurrent syncope s/p loop recorder who presented with intermittent LUE weakness and paresthesia.  As per patient for past 3 to 4 months she noticed intermittent weakness and numbness of left arm from shoulder to wrist.  On arrival to ED blood pressure was high at 197/62.  MRI brain showed multiple small acute infarcts of the right cerebral hemisphere.      Subjective:   Patient denies any complaints this morning except for some pain in the left foot where she has a known fracture.  The left-sided weakness appears to have improved.  Husband is at the bedside.     Assessment  & Plan :    Acute CVA -MRI brain showed multiple small acute infarcts in the right cerebral hemisphere -CT head and neck showed 70% stenosis of the proximal right ICA with mixed density atherosclerosis, 6.4 mm.  Projecting aneurysm of the left ICA.  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 Seen by neurology.  Plan is for aspirin and Plavix for 3 weeks followed by Plavix alone.  Patient has intolerant to long-term aspirin use and has had a history of GI bleeds with daily aspirin. PT and OT is following.  Plan is for home health when medically stable  for discharge.  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  See below. LDL 112. Continue statin.  Ezetimibe was added. HbA1c 6.8.  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   Seen by vascular surgery.  Underwent TCAR on 10/24.     Essential Hypertension Permissive hypertension was allowed.

## 2023-08-10 NOTE — Progress Notes (Signed)
Occupational Therapy Treatment Patient Details Name: [name] MRN: [id number] DOB: [birth year] Today's Date: 08/10/2023   History of present illness [name] is a 82 y.o. female admitted 10/22 who presents with intermittent LUE weakness and numbness. Pt with recent L foot fx after a syncopal episode on 10/15 at home.  10/22, she stood up abruptly felt left upper extremity weakness and noticed hemianopsia along with left-sided facial numbness and numbness/tingling in her left arm. She had MRI brain without contrast and MR cervical spine which showed acute small infarcts in the right cerebral hemisphere including the frontal and parietal lobes.  CT angiogram showed 70% stenosis of the right ICA consistent with right MCA territory infarcts.  Severe stenosis right vertebral artery V1 segment and occlusion of the V4. 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 Now s/p R TCAR 10/25. Plan for right TSA on 09/06/23. PMH: HTN, DM2, HLD   OT comments  Pt progressing toward established OT goals. S/p TCAR yesterday. Pt needing up to min A for STS this session with encouragement not to put weight through R wrist s/p procedure and A-line removal. Pt with VSS throughout. Needing mod A for LB ADL this session and min A for rise from EOB; CGA for steps toward chair. Will continue to follow. Current discharge disposition remains appropriate pending family ability to asisst with LB ADL at home.       If plan is discharge home, recommend the following:  A little help with bathing/dressing/bathroom;Assistance with cooking/housework   Equipment Recommendations  None recommended by OT    Recommendations for Other Services      Precautions / Restrictions Precautions Precautions: Fall Precaution Comments: Goal BP less than 180/105 per neuro MD Required Braces or Orthoses: Other Brace Other Brace: CAM boot left Restrictions Weight Bearing Restrictions: Yes LLE Weight Bearing: Weight bearing as  tolerated Other Position/Activity Restrictions: with CAM boot donned       Mobility Bed Mobility Overal bed mobility: Independent             General bed mobility comments: 1-2 cues to avoid weight through RUE s/p A-line removal    Transfers Overall transfer level: Needs assistance Equipment used: Rolling walker (2 wheels) Transfers: Sit to/from Stand Sit to Stand: Min assist           General transfer comment: very light lifting assist as pt encouraged not to push up from bed with RUE at this time.     Balance Overall balance assessment: Needs assistance Sitting-balance support: No upper extremity supported, Feet supported Sitting balance-Leahy Scale: Good Sitting balance - Comments: sitting EOB   Standing balance support: Bilateral upper extremity supported Standing balance-Leahy Scale: Fair Standing balance comment: Fair with RW for static/dynamic tasks                           ADL either performed or assessed with clinical judgement   ADL Overall ADL's : Needs assistance/impaired Eating/Feeding: Set up;Sitting                   Lower Body Dressing: Moderate assistance Lower Body Dressing Details (indicate cue type and reason): able to don sock and shoe on the R but assist on L due to unable to tolerate figure 4 or reach far enough forward to thread sock or appropriately tighten CAM boot Toilet Transfer: Minimal assistance;Stand-pivot;Rolling walker (2 wheels)  Functional mobility during ADLs: Contact guard assist;Rolling walker (2 wheels)      Extremity/Trunk Assessment Upper Extremity Assessment Upper Extremity Assessment: Right hand dominant;RUE deficits/detail;LUE deficits/detail RUE Deficits / Details: limited shoulder flexion due to prior rotator injury LUE Deficits / Details: generally weak, ROM WFL, sensation to touch not as strong as on R LUE Sensation: decreased light touch LUE Coordination: decreased fine motor    Lower Extremity Assessment Lower Extremity Assessment: Defer to PT evaluation        Vision   Additional Comments: Per RN, questionable visual deficits after CVA   Perception     Praxis      Cognition Arousal: Alert Behavior During Therapy: WFL for tasks assessed/performed Overall Cognitive Status: Within Functional Limits for tasks assessed                                          Exercises      Shoulder Instructions       General Comments VSS    Pertinent Vitals/ Pain       Pain Assessment Pain Assessment: Faces Faces Pain Scale: Hurts a little bit Pain Location: L foot, neck R Pain Descriptors / Indicators: Discomfort, Sore Pain Intervention(s): Limited activity within patient's tolerance, Monitored during session  Home Living                                          Prior Functioning/Environment              Frequency  Min 1X/week        Progress Toward Goals  OT Goals(current goals can now be found in the care plan section)  Progress towards OT goals: Progressing toward goals  Acute Rehab OT Goals Patient Stated Goal: get better OT Goal Formulation: With patient Time For Goal Achievement: 08/21/23 Potential to Achieve Goals: Good ADL Goals Pt Will Perform Grooming: sitting;with modified independence Pt Will Perform Lower Body Bathing: with caregiver independent in assisting;with set-up;sitting/lateral leans Pt Will Perform Lower Body Dressing: with set-up;sitting/lateral leans;with caregiver independent in assisting Pt Will Transfer to Toilet: stand pivot transfer;with supervision  Plan      Co-evaluation                 AM-PAC OT "6 Clicks" Daily Activity     Outcome Measure   Help from another person eating meals?: A Little Help from another person taking care of personal grooming?: A Little Help from another person toileting, which includes using toliet, bedpan, or urinal?: A Little Help  from another person bathing (including washing, rinsing, drying)?: A Little Help from another person to put on and taking off regular upper body clothing?: A Little Help from another person to put on and taking off regular lower body clothing?: A Lot 6 Click Score: 17    End of Session Equipment Utilized During Treatment: Gait belt;Rolling walker (2 wheels);Other (comment) (L CAM boot)  OT Visit Diagnosis: Other abnormalities of gait and mobility (R26.89);Muscle weakness (generalized) (M62.81);Pain Pain - Right/Left: Left Pain - part of body: Ankle and joints of foot   Activity Tolerance Patient tolerated treatment well   Patient Left with call bell/phone within reach;in chair   Nurse Communication Mobility status        Time: 6160-7371 OT Time Calculation (  min): 33 min  Charges: OT General Charges $OT Visit: 1 Visit OT Treatments $Self Care/Home Management : 8-22 mins $Therapeutic Activity: 8-22 mins  Tyler Deis, OTR/L [facility] Acute Rehabilitation Office: [phone]  Myrla Halsted 08/10/2023, 4:12 PM

## 2023-08-11 DIAGNOSIS — I6521 Occlusion and stenosis of right carotid artery: Secondary | ICD-10-CM | POA: Diagnosis not present

## 2023-08-11 DIAGNOSIS — I10 Essential (primary) hypertension: Secondary | ICD-10-CM | POA: Diagnosis not present

## 2023-08-11 DIAGNOSIS — I63421 Cerebral infarction due to embolism of right anterior cerebral artery: Secondary | ICD-10-CM | POA: Diagnosis not present

## 2023-08-11 LAB — CBC WITH DIFFERENTIAL/PLATELET
Abs Immature Granulocytes: 0.01 10*3/uL (ref 0.00–0.07)
Basophils Absolute: 0 10*3/uL (ref 0.0–0.1)
Basophils Relative: 1 %
Eosinophils Absolute: 0.4 10*3/uL (ref 0.0–0.5)
Eosinophils Relative: 6 %
HCT: 34 % — ABNORMAL LOW (ref 36.0–46.0)
Hemoglobin: 11.2 g/dL — ABNORMAL LOW (ref 12.0–15.0)
Immature Granulocytes: 0 %
Lymphocytes Relative: 41 %
Lymphs Abs: 2.5 10*3/uL (ref 0.7–4.0)
MCH: 30.9 pg (ref 26.0–34.0)
MCHC: 32.9 g/dL (ref 30.0–36.0)
MCV: 93.9 fL (ref 80.0–100.0)
Monocytes Absolute: 0.5 10*3/uL (ref 0.1–1.0)
Monocytes Relative: 9 %
Neutro Abs: 2.7 10*3/uL (ref 1.7–7.7)
Neutrophils Relative %: 43 %
Platelets: 149 10*3/uL — ABNORMAL LOW (ref 150–400)
RBC: 3.62 MIL/uL — ABNORMAL LOW (ref 3.87–5.11)
RDW: 13.1 % (ref 11.5–15.5)
WBC: 6.1 10*3/uL (ref 4.0–10.5)
nRBC: 0 % (ref 0.0–0.2)

## 2023-08-11 LAB — BASIC METABOLIC PANEL
Anion gap: 8 (ref 5–15)
BUN: 13 mg/dL (ref 8–23)
CO2: 24 mmol/L (ref 22–32)
Calcium: 8.7 mg/dL — ABNORMAL LOW (ref 8.9–10.3)
Chloride: 107 mmol/L (ref 98–111)
Creatinine, Ser: 1.06 mg/dL — ABNORMAL HIGH (ref 0.44–1.00)
GFR, Estimated: 53 mL/min — ABNORMAL LOW (ref >60)
Glucose, Bld: 117 mg/dL — ABNORMAL HIGH (ref 70–99)
Potassium: 3.9 mmol/L (ref 3.5–5.1)
Sodium: 139 mmol/L (ref 135–145)

## 2023-08-11 LAB — GLUCOSE, CAPILLARY
Glucose-Capillary: 113 mg/dL — ABNORMAL HIGH (ref 70–99)
Glucose-Capillary: 125 mg/dL — ABNORMAL HIGH (ref 70–99)
Glucose-Capillary: 129 mg/dL — ABNORMAL HIGH (ref 70–99)

## 2023-08-11 LAB — PROCALCITONIN: Procalcitonin: <0.1 ng/mL

## 2023-08-11 LAB — MAGNESIUM: Magnesium: 2 mg/dL (ref 1.7–2.4)

## 2023-08-11 LAB — BRAIN NATRIURETIC PEPTIDE: B Natriuretic Peptide: 202 pg/mL — ABNORMAL HIGH (ref 0.0–100.0)

## 2023-08-11 MED ORDER — LOSARTAN POTASSIUM 25 MG PO TABS
25.0000 mg | ORAL_TABLET | Freq: Every day | ORAL | Status: DC
  Administered 2023-08-11 – 2023-08-12 (×2): 25 mg via ORAL
  Filled 2023-08-11 (×2): qty 1

## 2023-08-11 NOTE — Progress Notes (Signed)
Physical Therapy Treatment Patient Details Name: [name] MRN: [id number] DOB: 05/17/1941 Today's Date: 08/11/2023   History of Present Illness [name] is a 82 y.o. female admitted 10/22 who presents with intermittent LUE weakness and numbness. Pt with recent L foot fx after a syncopal episode on 10/15 at home.  10/22, she stood up abruptly felt left upper extremity weakness and noticed hemianopsia along with left-sided facial numbness and numbness/tingling in her left arm. She had MRI brain without contrast and MR cervical spine which showed acute small infarcts in the right cerebral hemisphere including the frontal and parietal lobes.  CT angiogram showed 70% stenosis of the right ICA consistent with right MCA territory infarcts.  Severe stenosis right vertebral artery V1 segment and occlusion of the V4. 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 Now s/p R TCAR 10/25. Plan for right TSA on 09/06/23. PMH: HTN, DM2, HLD    PT Comments  Pt making good progress with mobility. Able to amb in hallway with supervision and negotiate stair with min assist. Should be able to return home when medically stable.     If plan is discharge home, recommend the following: A little help with walking and/or transfers;A little help with bathing/dressing/bathroom;Assistance with cooking/housework;Help with stairs or ramp for entrance;Assist for transportation   Can travel by private vehicle        Equipment Recommendations  None recommended by PT    Recommendations for Other Services       Precautions / Restrictions Precautions Precautions: Fall Precaution Comments: Goal BP less than 180/105 per neuro MD Required Braces or Orthoses: Other Brace Other Brace: CAM boot left Restrictions Weight Bearing Restrictions: Yes LLE Weight Bearing: Weight bearing as tolerated Other Position/Activity Restrictions: with CAM boot donned     Mobility  Bed Mobility Overal bed mobility: Modified  Independent                  Transfers Overall transfer level: Needs assistance Equipment used: Rolling walker (2 wheels) Transfers: Sit to/from Stand Sit to Stand: Supervision, From elevated surface           General transfer comment: Incr time and effort    Ambulation/Gait Ambulation/Gait assistance: Supervision Gait Distance (Feet): 140 Feet Assistive device: Rollator (4 wheels) Gait Pattern/deviations: Step-to pattern, Decreased weight shift to left   Gait velocity interpretation: <1.31 ft/sec, indicative of household ambulator   General Gait Details: Supervision for safety   Stairs Stairs: Yes Stairs assistance: Min assist Stair Management: Forwards, Step to pattern Number of Stairs: 1 General stair comments: One step with hand held assist   Wheelchair Mobility     Tilt Bed    Modified Rankin (Stroke Patients Only) Modified Rankin (Stroke Patients Only) Pre-Morbid Rankin Score: No significant disability Modified Rankin: Moderately severe disability     Balance Overall balance assessment: Needs assistance Sitting-balance support: No upper extremity supported, Feet supported Sitting balance-Leahy Scale: Good     Standing balance support: No upper extremity supported, During functional activity Standing balance-Leahy Scale: Fair                              Cognition Arousal: Alert Behavior During Therapy: WFL for tasks assessed/performed Overall Cognitive Status: Within Functional Limits for tasks assessed  Exercises      General Comments        Pertinent Vitals/Pain Pain Assessment Pain Assessment: Faces Faces Pain Scale: Hurts a little bit Pain Location: L foot, Rt shoulder Pain Descriptors / Indicators: Discomfort, Sore Pain Intervention(s): Limited activity within patient's tolerance    Home Living                          Prior Function             PT Goals (current goals can now be found in the care plan section) Acute Rehab PT Goals Patient Stated Goal: to go home Progress towards PT goals: Progressing toward goals    Frequency    Min 1X/week      PT Plan      Co-evaluation              AM-PAC PT "6 Clicks" Mobility   Outcome Measure  Help needed turning from your back to your side while in a flat bed without using bedrails?: None Help needed moving from lying on your back to sitting on the side of a flat bed without using bedrails?: None Help needed moving to and from a bed to a chair (including a wheelchair)?: A Little Help needed standing up from a chair using your arms (e.g., wheelchair or bedside chair)?: A Little Help needed to walk in hospital room?: A Little Help needed climbing 3-5 steps with a railing? : A Little 6 Click Score: 20    End of Session   Activity Tolerance: Patient tolerated treatment well Patient left: in bed;with call bell/phone within reach;with family/visitor present   PT Visit Diagnosis: Unsteadiness on feet (R26.81);Muscle weakness (generalized) (M62.81)     Time: 1610-9604 PT Time Calculation (min) (ACUTE ONLY): 30 min  Charges:    $Gait Training: 23-37 mins PT General Charges $$ ACUTE PT VISIT: 1 Visit                     [facility] PT Acute Rehabilitation Services Office [phone]    Angelina Ok [facility]ina [facility] 08/11/2023, 11:25 AM

## 2023-08-11 NOTE — Progress Notes (Signed)
PROGRESS NOTE                                                                                                                                                                                                             Patient Demographics:    [name], is a 82 y.o. female, DOB - 11-28-1940, EXB:[id number]  Outpatient Primary MD for the patient is [name], MD    LOS - 4  Admit date - 08/06/2023    Brief Narrative (HPI from H&P)   82 y.o. female with medical history significant of HTN, T2DM, HLD, anxiety, GERD, recurrent syncope s/p loop recorder who presented with intermittent LUE weakness and paresthesia.  As per patient for past 3 to 4 months she noticed intermittent weakness and numbness of left arm from shoulder to wrist.  On arrival to ED blood pressure was high at 197/62.  MRI brain showed multiple small acute infarcts of the right cerebral hemisphere.      Subjective:   Complains of pain around the neck area improved with Tylenol.  No other complaints offered.  Feels fatigue still.  Husband is at the bedside.   Assessment  & Plan :    Acute CVA -MRI brain showed multiple small acute infarcts in the right cerebral hemisphere -CT head and neck showed 70% stenosis of the proximal right ICA with mixed density atherosclerosis, 6.4 mm.  Projecting aneurysm of the left ICA.  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 Seen by neurology.  Plan is for aspirin and Plavix for 3 weeks followed by Plavix alone.  Patient has intolerant to long-term aspirin use and has had a history of GI bleeds with daily aspirin. PT and OT is following.  Plan is for home health when medically stable for discharge.  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   See below. LDL 112. Continue statin.  Ezetimibe was added. HbA1c 6.8. Stable from a neurological standpoint.  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   Seen by vascular surgery.  Underwent TCAR on 10/24.  Seems to be stable.   Essential Hypertension Permissive hypertension was allowed.   Now  she is several days out from her stroke.  We can reintroduce her antihypertensives gradually.   Depression -Continue sertraline   Closed nondisplaced fracture of the fifth metatarsal bone of the left foot -Remains immobilized in cam boot, HHPT, Walker -Follows podiatry as outpatient   GERD remote history of GI bleed related to NSAID use. -Continue pantoprazole, monitor CBC intermittently by PCP.          Family Communication  : Discussed with husband who was at the bedside  Code Status :  Full  Consults  :  VVS, Neuro       Disposition Plan  : Home with home health   DVT Prophylaxis  :  enoxaparin (LOVENOX) injection 40 mg Start: 08/10/23 1000 SCD's Start: 08/09/23 1654     Inpatient Medications  Scheduled Meds:  ascorbic acid  500 mg Oral Daily   aspirin EC  81 mg Oral Daily   brimonidine  1 drop Both Eyes BID   cholecalciferol  10,000 Units Oral Daily   clopidogrel  75 mg Oral Daily   docusate sodium  100 mg Oral Daily   enoxaparin (LOVENOX) injection  40 mg Subcutaneous Q24H   ezetimibe  10 mg Oral Daily   latanoprost  1 drop Right Eye QHS   loratadine  10 mg Oral Daily   montelukast  10 mg Oral QHS   pantoprazole  40 mg Oral Daily   rosuvastatin  40 mg Oral QHS   sertraline  100 mg Oral q morning   zinc sulfate  220 mg Oral Daily   Continuous Infusions:  magnesium sulfate bolus IVPB     PRN Meds:.acetaminophen **OR** acetaminophen (TYLENOL) oral liquid 160 mg/5 mL **OR** acetaminophen, alum & mag hydroxide-simeth, guaiFENesin-dextromethorphan, hydrALAZINE, labetalol, magnesium sulfate bolus IVPB, metoprolol tartrate, morphine  injection, ondansetron, mouth rinse, oxyCODONE, potassium chloride, senna-docusate, sucralfate  Antibiotics  :    Anti-infectives (From admission, onward)    Start     Dose/Rate Route Frequency Ordered Stop   08/09/23 2045  ceFAZolin (ANCEF) IVPB 2g/100 mL premix        2 g 200 mL/hr over 30 Minutes Intravenous Every 8 hours 08/09/23 1653 08/10/23 0554   08/09/23 1212  ceFAZolin (ANCEF) 2-4 GM/100ML-% IVPB       Note to Pharmacy: Loleta Rose: cabinet override      08/09/23 1212 08/10/23 0029         Objective:   Vitals:   08/10/23 1929 08/10/23 2257 08/11/23 0311 08/11/23 0851  BP: (!) 149/53 (!) 130/55 (!) 161/65 (!) 176/64  Pulse: 73 60 60   Resp: 16 18 17    Temp: 98.8 F (37.1 C) 98.6 F (37 C) 98 F (36.7 C) 98.5 F (36.9 C)  TempSrc: Oral Oral Oral Oral  SpO2: 95% 96% 98%   Weight:      Height:        Wt Readings from Last 3 Encounters:  08/07/23 102.9 kg  07/09/23 100 kg  04/23/23 100.4 kg     Intake/Output Summary (Last 24 hours) at 08/11/2023 1105 Last data filed at 08/11/2023 0855 Gross per 24 hour  Intake 200 ml  Output 2950 ml  Net -2750 ml     Physical Exam  General appearance: Awake alert.  In no distress Resp: Clear to auscultation bilaterally.  Normal effort Cardio: S1-S2 is normal regular.  No S3-S4.  No rubs murmurs or bruit GI: Abdomen is soft.  Nontender nondistended.  Bowel sounds are present normal.  No masses  organomegaly Extremities: No edema.        Data Review:    Recent Labs  Lab 08/06/23 1726 08/06/23 1738 08/08/23 0619 08/10/23 0545 08/11/23 0329  WBC 5.1  --  4.8 7.3 6.1  HGB 14.1 15.0 12.7 11.6* 11.2*  HCT 42.9 44.0 38.3 34.9* 34.0*  PLT 179  --  166 167 149*  MCV 96.2  --  92.3 94.3 93.9  MCH 31.6  --  30.6 31.4 30.9  MCHC 32.9  --  33.2 33.2 32.9  RDW 13.2  --  12.9 12.9 13.1  LYMPHSABS 2.0  --   --  1.7 2.5  MONOABS 0.4  --   --  0.6 0.5  EOSABS 0.4  --   --  0.0 0.4  BASOSABS 0.0  --   --  0.0 0.0     Recent Labs  Lab 08/05/23 1157 08/06/23 1726 08/06/23 1738 08/07/23 0328 08/08/23 0619 08/10/23 0545 08/11/23 0329  NA 141 138 140  --  137 137 139  K 4.4 4.4 4.3  --  3.9 4.0 3.9  CL 103 105 104  --  105 105 107  CO2 27 26  --   --  23 22 24   ANIONGAP  --  7  --   --  9 10 8   GLUCOSE 116* 122* 127*  --  114* 173* 117*  BUN 18 15 17   --  16 13 13   CREATININE 0.91 0.99 1.10*  --  1.02* 1.06* 1.06*  AST  --  33  --   --   --   --   --   ALT  --  20  --   --   --   --   --   ALKPHOS  --  69  --   --   --   --   --   BILITOT  --  0.7  --   --   --   --   --   ALBUMIN  --  3.6  --   --   --   --   --   PROCALCITON  --   --   --   --   --  <0.10 <0.10  INR  --  1.0  --   --   --   --   --   HGBA1C  --   --   --  6.8*  --   --   --   BNP  --   --   --   --   --  482.1* 202.0*  MG  --   --   --   --   --  2.1 2.0  CALCIUM 10.1 9.3  --   --  9.1 8.7* 8.7*      Recent Labs  Lab 08/05/23 1157 08/06/23 1726 08/07/23 0328 08/08/23 0619 08/10/23 0545 08/11/23 0329  PROCALCITON  --   --   --   --  <0.10 <0.10  INR  --  1.0  --   --   --   --   HGBA1C  --   --  6.8*  --   --   --   BNP  --   --   --   --  482.1* 202.0*  MG  --   --   --   --  2.1 2.0  CALCIUM 10.1 9.3  --  9.1 8.7* 8.7*    --------------------------------------------------------------------------------------------------------------- Lab Results  Component Value Date  CHOL 135 08/10/2023   HDL 58 08/10/2023   LDLCALC 71 08/10/2023   TRIG 30 08/10/2023   CHOLHDL 2.3 08/10/2023    Lab Results  Component Value Date   HGBA1C 6.8 (H) 08/07/2023   Micro Results Recent Results (from the past 240 hour(s))  Surgical pcr screen     Status: None   Collection Time: 08/08/23 10:00 PM   Specimen: Nasal Mucosa; Nasal Swab  Result Value Ref Range Status   MRSA, PCR NEGATIVE NEGATIVE Final   Staphylococcus aureus NEGATIVE NEGATIVE Final    Comment: (NOTE) The Xpert SA Assay (FDA approved for NASAL specimens  in patients 48 years of age and older), is one component of a comprehensive surveillance program. It is not intended to diagnose infection nor to guide or monitor treatment. Performed at [facility], [address], [place], [place] [address]     Radiology Reports DG C-Arm [phone] Min  Result Date: 08/09/2023 CLINICAL DATA:  Transcarotid artery revascularization EXAM: DG C-ARM 1-60 MIN CONTRAST:  Not reported. FLUOROSCOPY: Fluoroscopy Time:  4:09 Radiation Exposure Index (if provided by the fluoroscopic device): 27.59 mGy air kerma COMPARISON:  None Available. FINDINGS: Intraoperative fluoroscopic assistance is provided for transcarotid arterial revascularization. Please see operative report for procedural details. IMPRESSION: Intraoperative fluoroscopic assistance is provided for transcarotid arterial revascularization. Please see operative report for procedural details. Electronically Signed   By: Jearld Lesch M.D.   On: 08/09/2023 17:32      Signature  -   Osvaldo Shipper M.D on 08/11/2023 at 11:05 AM   -  To page go to [email or web address]

## 2023-08-11 NOTE — Plan of Care (Signed)
  Problem: Education: Goal: Knowledge of disease or condition will improve Outcome: Progressing Goal: Knowledge of secondary prevention will improve (MUST DOCUMENT ALL) Outcome: Progressing Goal: Knowledge of patient specific risk factors will improve [place]ta [place] N/A or DELETE if not current risk factor) Outcome: Progressing   Problem: Ischemic Stroke/TIA Tissue Perfusion: Goal: Complications of ischemic stroke/TIA will be minimized Outcome: Progressing   Problem: Coping: Goal: Will verbalize positive feelings about self Outcome: Progressing Goal: Will identify appropriate support needs Outcome: Progressing

## 2023-08-12 ENCOUNTER — Other Ambulatory Visit (HOSPITAL_COMMUNITY): Payer: Self-pay

## 2023-08-12 DIAGNOSIS — I63421 Cerebral infarction due to embolism of right anterior cerebral artery: Secondary | ICD-10-CM | POA: Diagnosis not present

## 2023-08-12 LAB — GLUCOSE, CAPILLARY: Glucose-Capillary: 125 mg/dL — ABNORMAL HIGH (ref 70–99)

## 2023-08-12 MED ORDER — POLYETHYLENE GLYCOL 3350 17 GM/SCOOP PO POWD
17.0000 g | Freq: Every day | ORAL | 0 refills | Status: DC
  Filled 2023-08-12: qty 476, 28d supply, fill #0

## 2023-08-12 MED ORDER — ASPIRIN 81 MG PO TBEC
81.0000 mg | DELAYED_RELEASE_TABLET | Freq: Every day | ORAL | 0 refills | Status: AC
  Filled 2023-08-12: qty 21, 21d supply, fill #0

## 2023-08-12 MED ORDER — SENNOSIDES-DOCUSATE SODIUM 8.6-50 MG PO TABS
2.0000 | ORAL_TABLET | Freq: Two times a day (BID) | ORAL | 0 refills | Status: DC
  Filled 2023-08-12: qty 60, 15d supply, fill #0

## 2023-08-12 MED ORDER — LOSARTAN POTASSIUM 25 MG PO TABS: 25.0000 mg | ORAL_TABLET | Freq: Every day | ORAL | Status: DC

## 2023-08-12 MED ORDER — TRAMADOL HCL 50 MG PO TABS
25.0000 mg | ORAL_TABLET | Freq: Three times a day (TID) | ORAL | 0 refills | Status: DC | PRN
  Filled 2023-08-12: qty 15, 10d supply, fill #0

## 2023-08-12 NOTE — TOC Transition Note (Signed)
Transition of Care (TOC) - CM/SW Discharge Note Donn Pierini RN, BSN Transitions of Care Unit 4E- RN Case Manager See Treatment Team for direct phone #   Patient Details  Name: [name] MRN: [id number] Date of Birth: 11-Jan-1941  Transition of Care [facility]) CM/SW Contact:  Darrold Span, RN Phone Number: 08/12/2023, 11:41 AM   Clinical Narrative:    Pt stable for transition home today, HHPT/OT has been set up w/ [facility] per previous CM note.  [facility] liaison notified for [facility] start of care and will contact pt for scheduling.   Pt has needed DME- no new DME needs noted.  Family to transport home.   No further TOC needs noted.    Final next level of care: Home w Home Health Services Barriers to Discharge: Barriers Resolved   Patient Goals and CMS Choice CMS Medicare.gov Compare Post Acute Care list provided to:: Patient Choice offered to / list presented to : Patient  Discharge Placement                 Home w/ [facility]        Discharge Plan and Services Additional resources added to the After Visit Summary for     Discharge Planning Services: CM Consult Post Acute Care Choice: Home Health, Durable Medical Equipment          DME Arranged: Walker rolling, Patient refused services (pt voiced she has RW at home) DME Agency: NA       HH Arranged: PT, OT HH Agency: [facility] Date [facility] Agency Contacted: 08/12/23 Time HH Agency Contacted: 1141 Representative spoke with at [facility] Agency: Kandee Keen  Social Determinants of Health (SDOH) Interventions SDOH Screenings   Food Insecurity: No Food Insecurity (08/07/2023)  Housing: Low Risk  (08/07/2023)  Transportation Needs: No Transportation Needs (08/07/2023)  Utilities: Not At Risk (08/07/2023)  Tobacco Use: Low Risk  (08/09/2023)     Readmission Risk Interventions    08/12/2023   11:41 AM  Readmission Risk Prevention Plan  Transportation Screening Complete  Home Care Screening Complete  Medication  Review (RN CM) Complete

## 2023-08-12 NOTE — Anesthesia Postprocedure Evaluation (Signed)
Anesthesia Post Note  Patient: [name]  Procedure(s) Performed: Zada Finders ARTERY REVASCULARIZATION (Right)     Patient location during evaluation: PACU Anesthesia Type: General Level of consciousness: awake and alert Pain management: pain level controlled Vital Signs Assessment: post-procedure vital signs reviewed and stable Respiratory status: spontaneous breathing, nonlabored ventilation, respiratory function stable and patient connected to nasal cannula oxygen Cardiovascular status: blood pressure returned to baseline and stable Postop Assessment: no apparent nausea or vomiting Anesthetic complications: no   No notable events documented.  Last Vitals:  Vitals:   08/12/23 0344 08/12/23 0938  BP: (!) 158/64 (!) 128/52  Pulse: 60 75  Resp:  18  Temp: 37.2 C 37.1 C  SpO2:  96%    Last Pain:  Vitals:   08/12/23 0938  TempSrc: Oral  PainSc:                  [name]

## 2023-08-12 NOTE — Care Management Important Message (Signed)
Important Message  Patient Details  Name: [name] MRN: [id number] Date of Birth: 25-Dec-1940   Important Message Given:  Yes - Medicare IM     Sherilyn Banker 08/12/2023, 4:30 PM

## 2023-08-12 NOTE — Progress Notes (Signed)
[facility] [place] PROGRESS NOTE  ASSESSMENT / PLAN: [name] is a 82 y.o. female status post R TCAR (08/09/23) for symptomatic carotid stenosis.  Recommend:  Abstinence from all tobacco products. Blood glucose control with goal A1c < 7%. Blood pressure control with goal blood pressure < 140/90 mmHg. Lipid reduction therapy with goal LDL-C <100 mg/dL  Aspirin 81mg  PO QD.  Clopidogrel 75mg  PO QD.  Follow up with me in 4 weeks with carotid duplex.   SUBJECTIVE: A little sore at incision. No other complaints. Left hand and leg felt to be improving.  OBJECTIVE: BP (!) 128/52 (BP Location: Left Arm)   Pulse 75   Temp 98.7 F (37.1 C) (Oral)   Resp 18   Ht 5' 10.5" (1.791 m)   Wt 102.9 kg   SpO2 96%   BMI 32.09 kg/m   Well appearing Similar LUE weakness to preop R neck incision clean and dry     Latest Ref Rng & Units 08/11/2023    3:29 AM 08/10/2023    5:45 AM 08/08/2023    6:19 AM  CBC  WBC 4.0 - 10.5 K/uL 6.1  7.3  4.8   Hemoglobin 12.0 - 15.0 g/dL 16.1  09.6  04.5   Hematocrit 36.0 - 46.0 % 34.0  34.9  38.3   Platelets 150 - 400 K/uL 149  167  166         Latest Ref Rng & Units 08/11/2023    3:29 AM 08/10/2023    5:45 AM 08/08/2023    6:19 AM  CMP  Glucose 70 - 99 mg/dL 409  811  914   BUN 8 - 23 mg/dL 13  13  16    Creatinine 0.44 - 1.00 mg/dL 7.82  9.56  2.13   Sodium 135 - 145 mmol/L 139  137  137   Potassium 3.5 - 5.1 mmol/L 3.9  4.0  3.9   Chloride 98 - 111 mmol/L 107  105  105   CO2 22 - 32 mmol/L 24  22  23    Calcium 8.9 - 10.3 mg/dL 8.7  8.7  9.1     Estimated Creatinine Clearance: 55.5 mL/min (A) (by C-G formula based on SCr of 1.06 mg/dL (H)).  Rande Brunt. Lenell Antu, MD [facility] Phone Number: [phone] 08/12/2023 9:50 AM

## 2023-08-12 NOTE — Discharge Summary (Signed)
[facility]  Physician Discharge Summary   Patient ID: [name] MRN: [id number] DOB/AGE: 1941/04/22 82 y.o.  Admit date: 08/06/2023 Discharge date: 08/12/2023    PCP: [name], MD  DISCHARGE DIAGNOSES:    CVA (cerebral vascular accident) [facility])   Essential hypertension   Depression   GERD   Closed nondisplaced fracture of fifth metatarsal bone of left foot   Carotid stenosis, right   RECOMMENDATIONS FOR OUTPATIENT FOLLOW UP: Vascular surgery to schedule outpatient appointment Patient to follow-up with neurology as well   Home Health: PT OT Equipment/Devices: None  CODE STATUS: Full code  DISCHARGE CONDITION: fair  Diet recommendation: Heart healthy  INITIAL HISTORY:  82 y.o. female with medical history significant of HTN, T2DM, HLD, anxiety, GERD, recurrent syncope s/p loop recorder who presented with intermittent LUE weakness and paresthesia.  As per patient for past 3 to 4 months she noticed intermittent weakness and numbness of left arm from shoulder to wrist.  On arrival to ED blood pressure was high at 197/62.  MRI brain showed multiple small acute infarcts of the right cerebral hemisphere.       HOSPITAL COURSE:   Acute CVA -MRI brain showed multiple small acute infarcts in the right cerebral hemisphere -CT head and neck showed 70% stenosis of the proximal right ICA with mixed density atherosclerosis, 6.4 mm.  Projecting aneurysm of the left ICA.  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 Seen by neurology.  Plan is for aspirin and Plavix for 3 weeks followed by Plavix alone.  Patient has intolerant to long-term aspirin use and has had a history of GI bleeds with daily aspirin. PT and OT is following.  Plan is for home health.  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  See below. LDL 112. Continue statin.  Ezetimibe was added. HbA1c 6.8. Stable from a neurological standpoint.   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   Seen by vascular surgery.  Underwent TCAR on 10/24.  Seems to be stable.   Essential Hypertension Permissive hypertension was allowed.   Now she is several days out from her stroke we can reintroduce her antihypertensives gradually.  She was started back on her ARB.   Depression -Continue sertraline   Closed nondisplaced fracture of the fifth metatarsal bone of the left foot -Remains immobilized in cam boot, HHPT, Walker -Follow-up with podiatry in the outpatient setting.   GERD remote history of GI bleed related to NSAID use. -Continue pantoprazole, monitor CBC intermittently by PCP.   Obesity Estimated body mass index is 32.09 kg/m as calculated from the following:   Height as of this encounter: 5' 10.5" (1.791 m).   Weight as of this encounter: 102.9 kg.  Patient is stable.  Okay for discharge home today.  PERTINENT LABS:  The results of significant diagnostics from this hospitalization (including imaging, microbiology, ancillary and laboratory) are listed below for reference.    Microbiology: Recent Results (from the past 240 hour(s))  Surgical pcr screen     Status: None   Collection Time: 08/08/23 10:00 PM   Specimen: Nasal Mucosa; Nasal Swab  Result Value Ref Range Status   MRSA, PCR NEGATIVE NEGATIVE Final   Staphylococcus aureus NEGATIVE NEGATIVE Final    Comment: (NOTE) The Xpert SA Assay (FDA approved  for NASAL specimens in patients 33 years of age and older), is one component of a comprehensive surveillance program. It is not intended to diagnose infection nor to guide or monitor treatment. Performed at [facility], [address] [place], [place] [address]      Labs:   Basic Metabolic Panel: Recent Labs  Lab 08/06/23 1726 08/06/23 1738  08/08/23 0619 08/10/23 0545 08/11/23 0329  NA 138 140 137 137 139  K 4.4 4.3 3.9 4.0 3.9  CL 105 104 105 105 107  CO2 26  --  23 22 24   GLUCOSE 122* 127* 114* 173* 117*  BUN 15 17 16 13 13   CREATININE 0.99 1.10* 1.02* 1.06* 1.06*  CALCIUM 9.3  --  9.1 8.7* 8.7*  MG  --   --   --  2.1 2.0   Liver Function Tests: Recent Labs  Lab 08/06/23 1726  AST 33  ALT 20  ALKPHOS 69  BILITOT 0.7  PROT 6.7  ALBUMIN 3.6    CBC: Recent Labs  Lab 08/06/23 1726 08/06/23 1738 08/08/23 0619 08/10/23 0545 08/11/23 0329  WBC 5.1  --  4.8 7.3 6.1  NEUTROABS 2.3  --   --  4.9 2.7  HGB 14.1 15.0 12.7 11.6* 11.2*  HCT 42.9 44.0 38.3 34.9* 34.0*  MCV 96.2  --  92.3 94.3 93.9  PLT 179  --  166 167 149*    BNP: BNP (last 3 results) Recent Labs    10/21/22 2147 08/10/23 0545 08/11/23 0329  BNP 74.6 482.1* 202.0*      CBG: Recent Labs  Lab 08/10/23 2051 08/11/23 0609 08/11/23 1154 08/11/23 2032 08/12/23 0605  GLUCAP 200* 113* 125* 129* 125*     IMAGING STUDIES DG C-Arm 1-60 Min  Result Date: 08/09/2023 CLINICAL DATA:  Transcarotid artery revascularization EXAM: DG C-ARM 1-60 MIN CONTRAST:  Not reported. FLUOROSCOPY: Fluoroscopy Time:  4:09 Radiation Exposure Index (if provided by the fluoroscopic device): 27.59 mGy air kerma COMPARISON:  None Available. FINDINGS: Intraoperative fluoroscopic assistance is provided for transcarotid arterial revascularization. Please see operative report for procedural details. IMPRESSION: Intraoperative fluoroscopic assistance is provided for transcarotid arterial revascularization. Please see operative report for procedural details. Electronically Signed   By: Jearld Lesch M.D.   On: 08/09/2023 17:32   ECHOCARDIOGRAM COMPLETE  Result Date: 08/07/2023    ECHOCARDIOGRAM REPORT   Patient Name:   [name] Date of Exam: 08/07/2023 Medical Rec #:  [id number]   Height:       71.0 in Accession #:    [id number]  Weight:       215.0 lb Date of  Birth:  03/02/1941  BSA:          2.174 m Patient Age:    81 years    BP:           172/57 mmHg Patient Gender: F           HR:           63 bpm. Exam Location:  Inpatient Procedure: 2D Echo, Cardiac Doppler and Color Doppler Indications:    Stroke I63.9  History:        Patient has prior history of Echocardiogram examinations, most                 recent 10/22/2022. Arrythmias:Bradycardia; Risk Factors:Diabetes,                 Dyslipidemia, Hypertension and Sleep Apnea.  Sonographer:  for NASAL specimens in patients 33 years of age and older), is one component of a comprehensive surveillance program. It is not intended to diagnose infection nor to guide or monitor treatment. Performed at [facility], [address] [place], [place] [address]      Labs:   Basic Metabolic Panel: Recent Labs  Lab 08/06/23 1726 08/06/23 1738  08/08/23 0619 08/10/23 0545 08/11/23 0329  NA 138 140 137 137 139  K 4.4 4.3 3.9 4.0 3.9  CL 105 104 105 105 107  CO2 26  --  23 22 24   GLUCOSE 122* 127* 114* 173* 117*  BUN 15 17 16 13 13   CREATININE 0.99 1.10* 1.02* 1.06* 1.06*  CALCIUM 9.3  --  9.1 8.7* 8.7*  MG  --   --   --  2.1 2.0   Liver Function Tests: Recent Labs  Lab 08/06/23 1726  AST 33  ALT 20  ALKPHOS 69  BILITOT 0.7  PROT 6.7  ALBUMIN 3.6    CBC: Recent Labs  Lab 08/06/23 1726 08/06/23 1738 08/08/23 0619 08/10/23 0545 08/11/23 0329  WBC 5.1  --  4.8 7.3 6.1  NEUTROABS 2.3  --   --  4.9 2.7  HGB 14.1 15.0 12.7 11.6* 11.2*  HCT 42.9 44.0 38.3 34.9* 34.0*  MCV 96.2  --  92.3 94.3 93.9  PLT 179  --  166 167 149*    BNP: BNP (last 3 results) Recent Labs    10/21/22 2147 08/10/23 0545 08/11/23 0329  BNP 74.6 482.1* 202.0*      CBG: Recent Labs  Lab 08/10/23 2051 08/11/23 0609 08/11/23 1154 08/11/23 2032 08/12/23 0605  GLUCAP 200* 113* 125* 129* 125*     IMAGING STUDIES DG C-Arm 1-60 Min  Result Date: 08/09/2023 CLINICAL DATA:  Transcarotid artery revascularization EXAM: DG C-ARM 1-60 MIN CONTRAST:  Not reported. FLUOROSCOPY: Fluoroscopy Time:  4:09 Radiation Exposure Index (if provided by the fluoroscopic device): 27.59 mGy air kerma COMPARISON:  None Available. FINDINGS: Intraoperative fluoroscopic assistance is provided for transcarotid arterial revascularization. Please see operative report for procedural details. IMPRESSION: Intraoperative fluoroscopic assistance is provided for transcarotid arterial revascularization. Please see operative report for procedural details. Electronically Signed   By: Jearld Lesch M.D.   On: 08/09/2023 17:32   ECHOCARDIOGRAM COMPLETE  Result Date: 08/07/2023    ECHOCARDIOGRAM REPORT   Patient Name:   [name] Date of Exam: 08/07/2023 Medical Rec #:  [id number]   Height:       71.0 in Accession #:    [id number]  Weight:       215.0 lb Date of  Birth:  03/02/1941  BSA:          2.174 m Patient Age:    81 years    BP:           172/57 mmHg Patient Gender: F           HR:           63 bpm. Exam Location:  Inpatient Procedure: 2D Echo, Cardiac Doppler and Color Doppler Indications:    Stroke I63.9  History:        Patient has prior history of Echocardiogram examinations, most                 recent 10/22/2022. Arrythmias:Bradycardia; Risk Factors:Diabetes,                 Dyslipidemia, Hypertension and Sleep Apnea.  Sonographer:  for NASAL specimens in patients 33 years of age and older), is one component of a comprehensive surveillance program. It is not intended to diagnose infection nor to guide or monitor treatment. Performed at [facility], [address] [place], [place] [address]      Labs:   Basic Metabolic Panel: Recent Labs  Lab 08/06/23 1726 08/06/23 1738  08/08/23 0619 08/10/23 0545 08/11/23 0329  NA 138 140 137 137 139  K 4.4 4.3 3.9 4.0 3.9  CL 105 104 105 105 107  CO2 26  --  23 22 24   GLUCOSE 122* 127* 114* 173* 117*  BUN 15 17 16 13 13   CREATININE 0.99 1.10* 1.02* 1.06* 1.06*  CALCIUM 9.3  --  9.1 8.7* 8.7*  MG  --   --   --  2.1 2.0   Liver Function Tests: Recent Labs  Lab 08/06/23 1726  AST 33  ALT 20  ALKPHOS 69  BILITOT 0.7  PROT 6.7  ALBUMIN 3.6    CBC: Recent Labs  Lab 08/06/23 1726 08/06/23 1738 08/08/23 0619 08/10/23 0545 08/11/23 0329  WBC 5.1  --  4.8 7.3 6.1  NEUTROABS 2.3  --   --  4.9 2.7  HGB 14.1 15.0 12.7 11.6* 11.2*  HCT 42.9 44.0 38.3 34.9* 34.0*  MCV 96.2  --  92.3 94.3 93.9  PLT 179  --  166 167 149*    BNP: BNP (last 3 results) Recent Labs    10/21/22 2147 08/10/23 0545 08/11/23 0329  BNP 74.6 482.1* 202.0*      CBG: Recent Labs  Lab 08/10/23 2051 08/11/23 0609 08/11/23 1154 08/11/23 2032 08/12/23 0605  GLUCAP 200* 113* 125* 129* 125*     IMAGING STUDIES DG C-Arm 1-60 Min  Result Date: 08/09/2023 CLINICAL DATA:  Transcarotid artery revascularization EXAM: DG C-ARM 1-60 MIN CONTRAST:  Not reported. FLUOROSCOPY: Fluoroscopy Time:  4:09 Radiation Exposure Index (if provided by the fluoroscopic device): 27.59 mGy air kerma COMPARISON:  None Available. FINDINGS: Intraoperative fluoroscopic assistance is provided for transcarotid arterial revascularization. Please see operative report for procedural details. IMPRESSION: Intraoperative fluoroscopic assistance is provided for transcarotid arterial revascularization. Please see operative report for procedural details. Electronically Signed   By: Jearld Lesch M.D.   On: 08/09/2023 17:32   ECHOCARDIOGRAM COMPLETE  Result Date: 08/07/2023    ECHOCARDIOGRAM REPORT   Patient Name:   [name] Date of Exam: 08/07/2023 Medical Rec #:  [id number]   Height:       71.0 in Accession #:    [id number]  Weight:       215.0 lb Date of  Birth:  03/02/1941  BSA:          2.174 m Patient Age:    81 years    BP:           172/57 mmHg Patient Gender: F           HR:           63 bpm. Exam Location:  Inpatient Procedure: 2D Echo, Cardiac Doppler and Color Doppler Indications:    Stroke I63.9  History:        Patient has prior history of Echocardiogram examinations, most                 recent 10/22/2022. Arrythmias:Bradycardia; Risk Factors:Diabetes,                 Dyslipidemia, Hypertension and Sleep Apnea.  Sonographer:  for NASAL specimens in patients 33 years of age and older), is one component of a comprehensive surveillance program. It is not intended to diagnose infection nor to guide or monitor treatment. Performed at [facility], [address] [place], [place] [address]      Labs:   Basic Metabolic Panel: Recent Labs  Lab 08/06/23 1726 08/06/23 1738  08/08/23 0619 08/10/23 0545 08/11/23 0329  NA 138 140 137 137 139  K 4.4 4.3 3.9 4.0 3.9  CL 105 104 105 105 107  CO2 26  --  23 22 24   GLUCOSE 122* 127* 114* 173* 117*  BUN 15 17 16 13 13   CREATININE 0.99 1.10* 1.02* 1.06* 1.06*  CALCIUM 9.3  --  9.1 8.7* 8.7*  MG  --   --   --  2.1 2.0   Liver Function Tests: Recent Labs  Lab 08/06/23 1726  AST 33  ALT 20  ALKPHOS 69  BILITOT 0.7  PROT 6.7  ALBUMIN 3.6    CBC: Recent Labs  Lab 08/06/23 1726 08/06/23 1738 08/08/23 0619 08/10/23 0545 08/11/23 0329  WBC 5.1  --  4.8 7.3 6.1  NEUTROABS 2.3  --   --  4.9 2.7  HGB 14.1 15.0 12.7 11.6* 11.2*  HCT 42.9 44.0 38.3 34.9* 34.0*  MCV 96.2  --  92.3 94.3 93.9  PLT 179  --  166 167 149*    BNP: BNP (last 3 results) Recent Labs    10/21/22 2147 08/10/23 0545 08/11/23 0329  BNP 74.6 482.1* 202.0*      CBG: Recent Labs  Lab 08/10/23 2051 08/11/23 0609 08/11/23 1154 08/11/23 2032 08/12/23 0605  GLUCAP 200* 113* 125* 129* 125*     IMAGING STUDIES DG C-Arm 1-60 Min  Result Date: 08/09/2023 CLINICAL DATA:  Transcarotid artery revascularization EXAM: DG C-ARM 1-60 MIN CONTRAST:  Not reported. FLUOROSCOPY: Fluoroscopy Time:  4:09 Radiation Exposure Index (if provided by the fluoroscopic device): 27.59 mGy air kerma COMPARISON:  None Available. FINDINGS: Intraoperative fluoroscopic assistance is provided for transcarotid arterial revascularization. Please see operative report for procedural details. IMPRESSION: Intraoperative fluoroscopic assistance is provided for transcarotid arterial revascularization. Please see operative report for procedural details. Electronically Signed   By: Jearld Lesch M.D.   On: 08/09/2023 17:32   ECHOCARDIOGRAM COMPLETE  Result Date: 08/07/2023    ECHOCARDIOGRAM REPORT   Patient Name:   [name] Date of Exam: 08/07/2023 Medical Rec #:  [id number]   Height:       71.0 in Accession #:    [id number]  Weight:       215.0 lb Date of  Birth:  03/02/1941  BSA:          2.174 m Patient Age:    81 years    BP:           172/57 mmHg Patient Gender: F           HR:           63 bpm. Exam Location:  Inpatient Procedure: 2D Echo, Cardiac Doppler and Color Doppler Indications:    Stroke I63.9  History:        Patient has prior history of Echocardiogram examinations, most                 recent 10/22/2022. Arrythmias:Bradycardia; Risk Factors:Diabetes,                 Dyslipidemia, Hypertension and Sleep Apnea.  Sonographer:  [facility]  Physician Discharge Summary   Patient ID: [name] MRN: [id number] DOB/AGE: 1941/04/22 82 y.o.  Admit date: 08/06/2023 Discharge date: 08/12/2023    PCP: [name], MD  DISCHARGE DIAGNOSES:    CVA (cerebral vascular accident) [facility])   Essential hypertension   Depression   GERD   Closed nondisplaced fracture of fifth metatarsal bone of left foot   Carotid stenosis, right   RECOMMENDATIONS FOR OUTPATIENT FOLLOW UP: Vascular surgery to schedule outpatient appointment Patient to follow-up with neurology as well   Home Health: PT OT Equipment/Devices: None  CODE STATUS: Full code  DISCHARGE CONDITION: fair  Diet recommendation: Heart healthy  INITIAL HISTORY:  82 y.o. female with medical history significant of HTN, T2DM, HLD, anxiety, GERD, recurrent syncope s/p loop recorder who presented with intermittent LUE weakness and paresthesia.  As per patient for past 3 to 4 months she noticed intermittent weakness and numbness of left arm from shoulder to wrist.  On arrival to ED blood pressure was high at 197/62.  MRI brain showed multiple small acute infarcts of the right cerebral hemisphere.       HOSPITAL COURSE:   Acute CVA -MRI brain showed multiple small acute infarcts in the right cerebral hemisphere -CT head and neck showed 70% stenosis of the proximal right ICA with mixed density atherosclerosis, 6.4 mm.  Projecting aneurysm of the left ICA.  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 Seen by neurology.  Plan is for aspirin and Plavix for 3 weeks followed by Plavix alone.  Patient has intolerant to long-term aspirin use and has had a history of GI bleeds with daily aspirin. PT and OT is following.  Plan is for home health.  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  See below. LDL 112. Continue statin.  Ezetimibe was added. HbA1c 6.8. Stable from a neurological standpoint.   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   Seen by vascular surgery.  Underwent TCAR on 10/24.  Seems to be stable.   Essential Hypertension Permissive hypertension was allowed.   Now she is several days out from her stroke we can reintroduce her antihypertensives gradually.  She was started back on her ARB.   Depression -Continue sertraline   Closed nondisplaced fracture of the fifth metatarsal bone of the left foot -Remains immobilized in cam boot, HHPT, Walker -Follow-up with podiatry in the outpatient setting.   GERD remote history of GI bleed related to NSAID use. -Continue pantoprazole, monitor CBC intermittently by PCP.   Obesity Estimated body mass index is 32.09 kg/m as calculated from the following:   Height as of this encounter: 5' 10.5" (1.791 m).   Weight as of this encounter: 102.9 kg.  Patient is stable.  Okay for discharge home today.  PERTINENT LABS:  The results of significant diagnostics from this hospitalization (including imaging, microbiology, ancillary and laboratory) are listed below for reference.    Microbiology: Recent Results (from the past 240 hour(s))  Surgical pcr screen     Status: None   Collection Time: 08/08/23 10:00 PM   Specimen: Nasal Mucosa; Nasal Swab  Result Value Ref Range Status   MRSA, PCR NEGATIVE NEGATIVE Final   Staphylococcus aureus NEGATIVE NEGATIVE Final    Comment: (NOTE) The Xpert SA Assay (FDA approved  for NASAL specimens in patients 33 years of age and older), is one component of a comprehensive surveillance program. It is not intended to diagnose infection nor to guide or monitor treatment. Performed at [facility], [address] [place], [place] [address]      Labs:   Basic Metabolic Panel: Recent Labs  Lab 08/06/23 1726 08/06/23 1738  08/08/23 0619 08/10/23 0545 08/11/23 0329  NA 138 140 137 137 139  K 4.4 4.3 3.9 4.0 3.9  CL 105 104 105 105 107  CO2 26  --  23 22 24   GLUCOSE 122* 127* 114* 173* 117*  BUN 15 17 16 13 13   CREATININE 0.99 1.10* 1.02* 1.06* 1.06*  CALCIUM 9.3  --  9.1 8.7* 8.7*  MG  --   --   --  2.1 2.0   Liver Function Tests: Recent Labs  Lab 08/06/23 1726  AST 33  ALT 20  ALKPHOS 69  BILITOT 0.7  PROT 6.7  ALBUMIN 3.6    CBC: Recent Labs  Lab 08/06/23 1726 08/06/23 1738 08/08/23 0619 08/10/23 0545 08/11/23 0329  WBC 5.1  --  4.8 7.3 6.1  NEUTROABS 2.3  --   --  4.9 2.7  HGB 14.1 15.0 12.7 11.6* 11.2*  HCT 42.9 44.0 38.3 34.9* 34.0*  MCV 96.2  --  92.3 94.3 93.9  PLT 179  --  166 167 149*    BNP: BNP (last 3 results) Recent Labs    10/21/22 2147 08/10/23 0545 08/11/23 0329  BNP 74.6 482.1* 202.0*      CBG: Recent Labs  Lab 08/10/23 2051 08/11/23 0609 08/11/23 1154 08/11/23 2032 08/12/23 0605  GLUCAP 200* 113* 125* 129* 125*     IMAGING STUDIES DG C-Arm 1-60 Min  Result Date: 08/09/2023 CLINICAL DATA:  Transcarotid artery revascularization EXAM: DG C-ARM 1-60 MIN CONTRAST:  Not reported. FLUOROSCOPY: Fluoroscopy Time:  4:09 Radiation Exposure Index (if provided by the fluoroscopic device): 27.59 mGy air kerma COMPARISON:  None Available. FINDINGS: Intraoperative fluoroscopic assistance is provided for transcarotid arterial revascularization. Please see operative report for procedural details. IMPRESSION: Intraoperative fluoroscopic assistance is provided for transcarotid arterial revascularization. Please see operative report for procedural details. Electronically Signed   By: Jearld Lesch M.D.   On: 08/09/2023 17:32   ECHOCARDIOGRAM COMPLETE  Result Date: 08/07/2023    ECHOCARDIOGRAM REPORT   Patient Name:   [name] Date of Exam: 08/07/2023 Medical Rec #:  [id number]   Height:       71.0 in Accession #:    [id number]  Weight:       215.0 lb Date of  Birth:  03/02/1941  BSA:          2.174 m Patient Age:    81 years    BP:           172/57 mmHg Patient Gender: F           HR:           63 bpm. Exam Location:  Inpatient Procedure: 2D Echo, Cardiac Doppler and Color Doppler Indications:    Stroke I63.9  History:        Patient has prior history of Echocardiogram examinations, most                 recent 10/22/2022. Arrythmias:Bradycardia; Risk Factors:Diabetes,                 Dyslipidemia, Hypertension and Sleep Apnea.  Sonographer:  [facility]  Physician Discharge Summary   Patient ID: [name] MRN: [id number] DOB/AGE: 1941/04/22 82 y.o.  Admit date: 08/06/2023 Discharge date: 08/12/2023    PCP: [name], MD  DISCHARGE DIAGNOSES:    CVA (cerebral vascular accident) [facility])   Essential hypertension   Depression   GERD   Closed nondisplaced fracture of fifth metatarsal bone of left foot   Carotid stenosis, right   RECOMMENDATIONS FOR OUTPATIENT FOLLOW UP: Vascular surgery to schedule outpatient appointment Patient to follow-up with neurology as well   Home Health: PT OT Equipment/Devices: None  CODE STATUS: Full code  DISCHARGE CONDITION: fair  Diet recommendation: Heart healthy  INITIAL HISTORY:  82 y.o. female with medical history significant of HTN, T2DM, HLD, anxiety, GERD, recurrent syncope s/p loop recorder who presented with intermittent LUE weakness and paresthesia.  As per patient for past 3 to 4 months she noticed intermittent weakness and numbness of left arm from shoulder to wrist.  On arrival to ED blood pressure was high at 197/62.  MRI brain showed multiple small acute infarcts of the right cerebral hemisphere.       HOSPITAL COURSE:   Acute CVA -MRI brain showed multiple small acute infarcts in the right cerebral hemisphere -CT head and neck showed 70% stenosis of the proximal right ICA with mixed density atherosclerosis, 6.4 mm.  Projecting aneurysm of the left ICA.  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 Seen by neurology.  Plan is for aspirin and Plavix for 3 weeks followed by Plavix alone.  Patient has intolerant to long-term aspirin use and has had a history of GI bleeds with daily aspirin. PT and OT is following.  Plan is for home health.  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  See below. LDL 112. Continue statin.  Ezetimibe was added. HbA1c 6.8. Stable from a neurological standpoint.   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   Seen by vascular surgery.  Underwent TCAR on 10/24.  Seems to be stable.   Essential Hypertension Permissive hypertension was allowed.   Now she is several days out from her stroke we can reintroduce her antihypertensives gradually.  She was started back on her ARB.   Depression -Continue sertraline   Closed nondisplaced fracture of the fifth metatarsal bone of the left foot -Remains immobilized in cam boot, HHPT, Walker -Follow-up with podiatry in the outpatient setting.   GERD remote history of GI bleed related to NSAID use. -Continue pantoprazole, monitor CBC intermittently by PCP.   Obesity Estimated body mass index is 32.09 kg/m as calculated from the following:   Height as of this encounter: 5' 10.5" (1.791 m).   Weight as of this encounter: 102.9 kg.  Patient is stable.  Okay for discharge home today.  PERTINENT LABS:  The results of significant diagnostics from this hospitalization (including imaging, microbiology, ancillary and laboratory) are listed below for reference.    Microbiology: Recent Results (from the past 240 hour(s))  Surgical pcr screen     Status: None   Collection Time: 08/08/23 10:00 PM   Specimen: Nasal Mucosa; Nasal Swab  Result Value Ref Range Status   MRSA, PCR NEGATIVE NEGATIVE Final   Staphylococcus aureus NEGATIVE NEGATIVE Final    Comment: (NOTE) The Xpert SA Assay (FDA approved

## 2023-08-12 NOTE — Plan of Care (Signed)
  Problem: Education: Goal: Knowledge of disease or condition will improve Outcome: Progressing Goal: Knowledge of secondary prevention will improve (MUST DOCUMENT ALL) Outcome: Progressing Goal: Knowledge of patient specific risk factors will improve Loraine Leriche N/A or DELETE if not current risk factor) Outcome: Progressing   Problem: Ischemic Stroke/TIA Tissue Perfusion: Goal: Complications of ischemic stroke/TIA will be minimized Outcome: Progressing   Problem: Coping: Goal: Will verbalize positive feelings about self Outcome: Progressing Goal: Will identify appropriate support needs Outcome: Progressing   Problem: Health Behavior/Discharge Planning: Goal: Ability to manage health-related needs will improve Outcome: Progressing   Problem: Self-Care: Goal: Ability to participate in self-care as condition permits will improve Outcome: Progressing Goal: Verbalization of feelings and concerns over difficulty with self-care will improve Outcome: Progressing Goal: Ability to communicate needs accurately will improve Outcome: Progressing

## 2023-08-14 NOTE — H&P (Signed)
Patient's anticipated LOS is less than 2 midnights, meeting these requirements: - Younger than 83 - Lives within 1 hour of care - Has a competent adult at home to recover with post-op recover - NO history of  - Chronic pain requiring opiods  - Diabetes  - Coronary Artery Disease  - Heart failure  - Heart attack  - Stroke  - DVT/VTE  - Cardiac arrhythmia  - Respiratory Failure/COPD  - Renal failure  - Anemia  - Advanced Liver disease     [name] is an 82 y.o. female.    Chief Complaint: right shoulder pain  HPI: Pt is a 82 y.o. female complaining of right shoulder pain for multiple years. Pain had continually increased since the beginning. X-rays in the clinic show end-stage arthritic changes of the right shoulder. Pt has tried various conservative treatments which have failed to alleviate their symptoms, including injections and therapy. Various options are discussed with the patient. Risks, benefits and expectations were discussed with the patient. Patient understand the risks, benefits and expectations and wishes to proceed with surgery.   PCP:  [name], MD  D/C Plans: Home  PMH: Past Medical History:  Diagnosis Date   Allergy    Anal or rectal pain    sometimes   Anemia    hx of during pregnancy   Anxiety    Arthritis    Asthma    hx of   Bradycardia    " I KNOW I HAVE BRADYCARDIA ESPECIALLY WHEN I SLEEP"    Cataract 2021   bilateral eyes   Chronic back pain    Degenerative joint disease    osteo   Depression    Diabetes mellitus without complication (HCC)    DM type II   Diverticulosis 2003   Dysrhythmia    hx of  due to eye drop and also low heart rate 40's per pt. Dr. Allyson Sabal follows   Elevated total protein    Esophageal dysmotility    Fibromyalgia    GERD (gastroesophageal reflux disease)    subsequent Nissen Fundoplication   Glaucoma    Hearing loss    Heart murmur    "was told she had a heart murmur"   Hemorrhoids    Hiatal  hernia 11/08/2009   Hx of adenomatous colonic polyps 07/02/2002   Hypercalcemia    Hyperlipidemia    Hyperlipidemia    Hypertension    Implantable loop recorder present 11/28/2021   Nausea    Osteoporosis    PONV (postoperative nausea and vomiting)    Rectal bleeding    from hemorrhoids.     Sleep apnea    DOES USE CPAP    Thrombocytopenia (HCC)    Varicose veins of left lower extremity     PSH: Past Surgical History:  Procedure Laterality Date   CARDIAC CATHETERIZATION  03/14/1992   Normal cardiac cath. Normal LV function.   CARDIOVASCULAR STRESS TEST  01/22/2011   No scintigraphic evidence of inducible ischemia.   CAROTID DOPPLER  03/31/2007   Bilateral ICAs - no evidence of significant diameter reduction, dissectin, tortuosity, FMD, or any other vascular abnormality.   CATARACT EXTRACTION, BILATERAL     ESOPHAGEAL MANOMETRY N/A 11/14/2015   Procedure: ESOPHAGEAL MANOMETRY (EM);  Surgeon: Napoleon Form, MD;  Location: [facility];  Service: Endoscopy;  Laterality: N/A;   ESOPHAGEAL MANOMETRY N/A 01/18/2021   Procedure: ESOPHAGEAL MANOMETRY (EM);  Surgeon: Napoleon Form, MD;  Location: [facility];  Service: Endoscopy;  Laterality: N/A;   ESOPHAGOGASTRODUODENOSCOPY N/A 03/08/2021   Procedure: ESOPHAGOGASTRODUODENOSCOPY (EGD);  Surgeon: Corliss Skains, MD;  Location: [facility] OR;  Service: Thoracic;  Laterality: N/A;   EYE SURGERY     bilateral cataracts; bilateral stents   GASTRIC RESECTION  2009   GLAUCOMA SURGERY     JOINT REPLACEMENT     right knee Dr. Lequita Halt 06-23-18   KNEE SURGERY Bilateral    NISSEN FUNDOPLICATION  2000   with subsequent takedown in 2009   TOTAL KNEE ARTHROPLASTY Left 06/10/2017   Procedure: LEFT  TOTAL KNEE ARTHROPLASTY;  Surgeon: Ollen Gross, MD;  Location: [facility] OR[facility];  Service: Orthopedics;  Laterality: Left;  Adductor Block   TOTAL KNEE ARTHROPLASTY Right 06/23/2018   Procedure: RIGHT TOTAL KNEE ARTHROPLASTY;  Surgeon: Ollen Gross, MD;   Location: [facility];  Service: Orthopedics;  Laterality: Right;   TRANSCAROTID ARTERY REVASCULARIZATION  Right 08/09/2023   Procedure: TRANSCAROTID ARTERY REVASCULARIZATION;  Surgeon: Leonie Douglas, MD;  Location[facility] OR;  Service: Vascular;  Laterality: Right;   TRANSTHORACIC ECHOCARDIOGRAM  12/21/2010   EF 60%, moderate LVH,    TUBAL LIGATION     XI ROBOTIC ASSISTED HIATAL HERNIA REPAIR N/A 03/08/2021   Procedure: XI ROBOTIC ASSISTED LAPAROSCOPY WITH LYSIS OF ADHESIONS;  Surgeon: Corliss Skains, MD;  Location: [facility] OR;  Service: Thoracic;  Laterality: N/A;    Social History:  reports that she has never smoked. She has never used smokeless tobacco. She reports that she does not drink alcohol and does not use drugs. BMI: Estimated body mass index is 32.09 kg/m as calculated from the following:   Height as of 08/07/23: 5' 10.5" (1.791 m).   Weight as of 08/07/23: 102.9 kg.  Lab Results  Component Value Date   ALBUMIN 3.6 08/06/2023   Diabetes: Patient does not have a diagnosis of diabetes. Lab Results  Component Value Date   HGBA1C 6.8 (H) 08/07/2023     Smoking Status:   reports that she has never smoked. She has never used smokeless tobacco.    Allergies:  Allergies  Allergen Reactions   Adalat [Nifedipine] Other (See Comments)    Tired, Made pt "feel crazy in the head"   Hydrocodone Itching and Nausea And Vomiting   Meperidine Hcl Other (See Comments)    Demerol causes hallucinations   Naproxen Nausea And Vomiting    Medications: No current facility-administered medications for this encounter.   Current Outpatient Medications  Medication Sig Dispense Refill   acetaminophen (TYLENOL) 500 MG tablet Take 1,000 mg by mouth 2 (two) times daily as needed for headache (back and knee pain).     albuterol (VENTOLIN HFA) 108 (90 Base) MCG/ACT inhaler Inhale 1 puff into the lungs as needed for wheezing or shortness of breath.     ascorbic acid (VITAMIN C) 500 MG tablet Take  1 tablet (500 mg total) by mouth daily. 90 tablet 0   aspirin EC 81 MG tablet Take 1 tablet (81 mg total) by mouth daily for 21 days. For 3 weeks only. 21 tablet 0   brimonidine (ALPHAGAN) 0.2 % ophthalmic solution Place 1 drop into both eyes 2 (two) times daily.     cetirizine (ZYRTEC) 10 MG tablet Take 10 mg by mouth at bedtime.     Cholecalciferol (VITAMIN D3) 250 MCG (10000 UT) capsule Take 10,000 Units by mouth daily.     clopidogrel (PLAVIX) 75 MG tablet Take 1 tablet (75 mg total) by mouth daily. 30 tablet 1  dexlansoprazole (DEXILANT) 60 MG capsule Take 1 capsule (60 mg total) by mouth daily. 90 capsule 3   ezetimibe (ZETIA) 10 MG tablet Take 1 tablet (10 mg total) by mouth daily. 30 tablet 0   hydrocortisone (ANUSOL-HC) 25 MG suppository Place 1 suppository (25 mg total) rectally at bedtime. 5 suppository 1   ketoconazole (NIZORAL) 2 % cream Apply 1 Application topically daily. 60 g 2   latanoprost (XALATAN) 0.005 % ophthalmic solution Place 1 drop into the right eye at bedtime.     losartan (COZAAR) 25 MG tablet Take 1 tablet (25 mg total) by mouth daily.     metFORMIN (GLUCOPHAGE) 500 MG tablet Take 500 mg by mouth every morning.     montelukast (SINGULAIR) 10 MG tablet Take 10 mg by mouth at bedtime.     nystatin ointment (MYCOSTATIN) Apply 1 application  topically See admin instructions. 1 application 1-2 times a day as needed for rash  0   Polyethyl Glycol-Propyl Glycol (SYSTANE OP) Place 1 drop into both eyes daily as needed (dry/irritated eyes).     polyethylene glycol powder (GLYCOLAX/MIRALAX) 17 GM/SCOOP powder Take 17 g by mouth daily. 476 g 0   rosuvastatin (CRESTOR) 40 MG tablet Take 40 mg by mouth at bedtime.     senna-docusate (SENOKOT-S) 8.6-50 MG tablet Take 2 tablets by mouth 2 (two) times daily. 60 tablet 0   sertraline (ZOLOFT) 100 MG tablet Take 100 mg by mouth every morning.     sucralfate (CARAFATE) 1 g tablet Take 1 tablet (1 g total) by mouth 2 (two) times daily  as needed. Do not take within 2 hours of any other medications. 60 tablet 3   traMADol (ULTRAM) 50 MG tablet Take 0.5 tablets (25 mg total) by mouth 3 (three) times daily as needed. 15 tablet 0   zinc sulfate 220 (50 Zn) MG capsule Take 1 capsule (220 mg total) by mouth daily. 90 capsule 0    No results found for this or any previous visit (from the past 48 hour(s)). No results found.  ROS: Pain with rom of the right upper extremity  Physical Exam: Alert and oriented 82 y.o. female in no acute distress Cranial nerves 2-12 intact Cervical spine: full rom with no tenderness, nv intact distally Chest: active breath sounds bilaterally, no wheeze rhonchi or rales Heart: regular rate and rhythm, no murmur Abd: non tender non distended with active bowel sounds Hip is stable with rom  Right shoulder painful rom with weakness Nv intact distally No rashes or edema distally   Assessment/Plan Assessment: right shoulder cuff arthropathy  Plan:  Patient will undergo a right reverse total shoulder by Dr. Ranell Patrick at [place] Risks benefits and expectations were discussed with the patient. Patient understand risks, benefits and expectations and wishes to proceed. Preoperative templating of the joint replacement has been completed, documented, and submitted to the Operating Room personnel in order to optimize intra-operative equipment management.   Alphonsa Overall PA-C, MPAS [facility] is now [employer] [address], [place], [place] [address] Phone: [phone] [email or web address] Facebook  [employer]

## 2023-08-15 ENCOUNTER — Other Ambulatory Visit (HOSPITAL_BASED_OUTPATIENT_CLINIC_OR_DEPARTMENT_OTHER): Payer: Self-pay | Admitting: Internal Medicine

## 2023-08-15 ENCOUNTER — Telehealth: Payer: Self-pay

## 2023-08-15 DIAGNOSIS — E1165 Type 2 diabetes mellitus with hyperglycemia: Secondary | ICD-10-CM | POA: Diagnosis not present

## 2023-08-15 DIAGNOSIS — E7800 Pure hypercholesterolemia, unspecified: Secondary | ICD-10-CM | POA: Diagnosis not present

## 2023-08-15 DIAGNOSIS — R600 Localized edema: Secondary | ICD-10-CM | POA: Diagnosis not present

## 2023-08-15 DIAGNOSIS — L308 Other specified dermatitis: Secondary | ICD-10-CM | POA: Diagnosis not present

## 2023-08-15 DIAGNOSIS — K219 Gastro-esophageal reflux disease without esophagitis: Secondary | ICD-10-CM | POA: Diagnosis not present

## 2023-08-15 DIAGNOSIS — R55 Syncope and collapse: Secondary | ICD-10-CM | POA: Diagnosis not present

## 2023-08-15 DIAGNOSIS — F321 Major depressive disorder, single episode, moderate: Secondary | ICD-10-CM | POA: Diagnosis not present

## 2023-08-15 DIAGNOSIS — Z789 Other specified health status: Secondary | ICD-10-CM | POA: Diagnosis not present

## 2023-08-15 DIAGNOSIS — S92355D Nondisplaced fracture of fifth metatarsal bone, left foot, subsequent encounter for fracture with routine healing: Secondary | ICD-10-CM | POA: Diagnosis not present

## 2023-08-15 DIAGNOSIS — G4733 Obstructive sleep apnea (adult) (pediatric): Secondary | ICD-10-CM | POA: Diagnosis not present

## 2023-08-15 DIAGNOSIS — Z9862 Peripheral vascular angioplasty status: Secondary | ICD-10-CM | POA: Diagnosis not present

## 2023-08-15 DIAGNOSIS — I63131 Cerebral infarction due to embolism of right carotid artery: Secondary | ICD-10-CM | POA: Diagnosis not present

## 2023-08-15 NOTE — Patient Outreach (Signed)
Emmi Stroke Care Coordination Follow Up  08/15/2023 Name:  [name] MRN:  [id number] DOB:  12-30-1940  Subjective: [name] is a 82 y.o. year old female who is a primary care patient of [name], MD An Emmi alert was received on 08/14/23 indicating patient responded to questions: Scheduled a follow-up appointment?. I reached out by phone to follow up on the alert. HIPAA compliant voicemail message left along with contact info.  Care Coordination Interventions:  No, not indicated   Follow up plan:  RN CM will make outreach to patient if no return call.    Encounter Outcome:  No Answer   Cheryl Fairy, RN,BSN,CCM RN Care Manager Transitions of Care  -VBCI/Population Health  Direct Phone: [phone] Toll Free: [phone] Fax: [phone]'

## 2023-08-15 NOTE — Patient Outreach (Signed)
Received a red flag Emmi stroke notification. I have assigned Roshanda Florance, RN to call for follow up and determine if there are any Case Management needs.    Laura Greeson, CBCS, CMAA [facility] Care Management Assistant [facility] Care Management [phone]  

## 2023-08-16 ENCOUNTER — Telehealth: Payer: Self-pay

## 2023-08-16 DIAGNOSIS — E119 Type 2 diabetes mellitus without complications: Secondary | ICD-10-CM | POA: Diagnosis not present

## 2023-08-16 DIAGNOSIS — W19XXXD Unspecified fall, subsequent encounter: Secondary | ICD-10-CM | POA: Diagnosis not present

## 2023-08-16 DIAGNOSIS — Z7982 Long term (current) use of aspirin: Secondary | ICD-10-CM | POA: Diagnosis not present

## 2023-08-16 DIAGNOSIS — I700 Atherosclerosis of aorta: Secondary | ICD-10-CM | POA: Diagnosis not present

## 2023-08-16 DIAGNOSIS — F419 Anxiety disorder, unspecified: Secondary | ICD-10-CM | POA: Diagnosis not present

## 2023-08-16 DIAGNOSIS — Z48812 Encounter for surgical aftercare following surgery on the circulatory system: Secondary | ICD-10-CM | POA: Diagnosis not present

## 2023-08-16 DIAGNOSIS — M9981 Other biomechanical lesions of cervical region: Secondary | ICD-10-CM | POA: Diagnosis not present

## 2023-08-16 DIAGNOSIS — R200 Anesthesia of skin: Secondary | ICD-10-CM | POA: Diagnosis not present

## 2023-08-16 DIAGNOSIS — Z95818 Presence of other cardiac implants and grafts: Secondary | ICD-10-CM | POA: Diagnosis not present

## 2023-08-16 DIAGNOSIS — E785 Hyperlipidemia, unspecified: Secondary | ICD-10-CM | POA: Diagnosis not present

## 2023-08-16 DIAGNOSIS — I69398 Other sequelae of cerebral infarction: Secondary | ICD-10-CM | POA: Diagnosis not present

## 2023-08-16 DIAGNOSIS — F32A Depression, unspecified: Secondary | ICD-10-CM | POA: Diagnosis not present

## 2023-08-16 DIAGNOSIS — S92352D Displaced fracture of fifth metatarsal bone, left foot, subsequent encounter for fracture with routine healing: Secondary | ICD-10-CM | POA: Diagnosis not present

## 2023-08-16 DIAGNOSIS — I083 Combined rheumatic disorders of mitral, aortic and tricuspid valves: Secondary | ICD-10-CM | POA: Diagnosis not present

## 2023-08-16 DIAGNOSIS — R29898 Other symptoms and signs involving the musculoskeletal system: Secondary | ICD-10-CM | POA: Diagnosis not present

## 2023-08-16 DIAGNOSIS — Z7984 Long term (current) use of oral hypoglycemic drugs: Secondary | ICD-10-CM | POA: Diagnosis not present

## 2023-08-16 DIAGNOSIS — I10 Essential (primary) hypertension: Secondary | ICD-10-CM | POA: Diagnosis not present

## 2023-08-16 DIAGNOSIS — Z8719 Personal history of other diseases of the digestive system: Secondary | ICD-10-CM | POA: Diagnosis not present

## 2023-08-16 DIAGNOSIS — Z96659 Presence of unspecified artificial knee joint: Secondary | ICD-10-CM | POA: Diagnosis not present

## 2023-08-16 DIAGNOSIS — K219 Gastro-esophageal reflux disease without esophagitis: Secondary | ICD-10-CM | POA: Diagnosis not present

## 2023-08-16 DIAGNOSIS — Z7902 Long term (current) use of antithrombotics/antiplatelets: Secondary | ICD-10-CM | POA: Diagnosis not present

## 2023-08-16 DIAGNOSIS — Z9181 History of falling: Secondary | ICD-10-CM | POA: Diagnosis not present

## 2023-08-16 NOTE — Patient Outreach (Signed)
  Emmi Stroke Care Coordination Follow Up  08/16/2023 Name:  [name] MRN:  [id number] DOB:  [birth year]  Subjective: [name] is a 82 y.o. year old female who is a primary care patient of Bakare, Mobolaji B, MD An Emmi alert was received indicating patient responded to questions: Scheduled a follow-up appointment?. I reached out by phone to follow up on the alert and spoke to Patient. Patient states she is still resting in bed. She voices some ongoing chronic shoulder pain-took Tylenol earlier this AM. Reviewed and addressed red alert. Pt confirms she has new PCP-Dr. Corky Downs and had first visit with him on yesterday. She has neuro appt on 11/06/23 and sees Dr. Lenell Antu on 09/10/23. Pt confirms she has spouse who is able to take her to appts. She confirms she has all her meds-no issues or concerns regarding them. She voices that therapist is coming today for initial visit. Appetite is fair. She had some nausea yest but sxs have bene relieved. No issues with elimination. She denies any RN CM needs or concerns at this time.   Care Coordination Interventions:  Yes, provided   Follow up plan: Advised patient that they would continue to get automated EMMI-Stroke post discharge calls to assess how they are doing following recent hospitalization and will receive a call from a nurse if any of their responses were abnormal. Patient voiced understanding and was appreciative of f/u call.   Encounter Outcome:  Patient Visit Completed   Antionette Fairy, RN,BSN,CCM RN Care Manager Transitions of Care  [place]-VBCI/Population Health  Direct Phone: [phone] Toll Free: [phone] Fax: [phone]

## 2023-08-17 ENCOUNTER — Other Ambulatory Visit: Payer: Self-pay

## 2023-08-17 ENCOUNTER — Emergency Department (HOSPITAL_COMMUNITY): Payer: Medicare Other

## 2023-08-17 ENCOUNTER — Emergency Department (HOSPITAL_COMMUNITY)
Admission: EM | Admit: 2023-08-17 | Discharge: 2023-08-18 | Disposition: A | Discharge status: Home / Self Care | Payer: Medicare Other | Attending: Emergency Medicine | Admitting: Emergency Medicine

## 2023-08-17 ENCOUNTER — Encounter (HOSPITAL_COMMUNITY): Payer: Self-pay

## 2023-08-17 DIAGNOSIS — Z7982 Long term (current) use of aspirin: Secondary | ICD-10-CM | POA: Insufficient documentation

## 2023-08-17 DIAGNOSIS — I4891 Unspecified atrial fibrillation: Secondary | ICD-10-CM | POA: Diagnosis not present

## 2023-08-17 DIAGNOSIS — Z8673 Personal history of transient ischemic attack (TIA), and cerebral infarction without residual deficits: Secondary | ICD-10-CM | POA: Diagnosis not present

## 2023-08-17 DIAGNOSIS — R002 Palpitations: Secondary | ICD-10-CM | POA: Diagnosis not present

## 2023-08-17 DIAGNOSIS — Z7984 Long term (current) use of oral hypoglycemic drugs: Secondary | ICD-10-CM | POA: Insufficient documentation

## 2023-08-17 DIAGNOSIS — I10 Essential (primary) hypertension: Secondary | ICD-10-CM | POA: Diagnosis not present

## 2023-08-17 DIAGNOSIS — I517 Cardiomegaly: Secondary | ICD-10-CM | POA: Diagnosis not present

## 2023-08-17 DIAGNOSIS — Z79899 Other long term (current) drug therapy: Secondary | ICD-10-CM | POA: Diagnosis not present

## 2023-08-17 DIAGNOSIS — I700 Atherosclerosis of aorta: Secondary | ICD-10-CM | POA: Diagnosis not present

## 2023-08-17 DIAGNOSIS — R5383 Other fatigue: Secondary | ICD-10-CM | POA: Diagnosis present

## 2023-08-17 DIAGNOSIS — E119 Type 2 diabetes mellitus without complications: Secondary | ICD-10-CM | POA: Diagnosis not present

## 2023-08-17 LAB — CBC
HCT: 38.6 % (ref 36.0–46.0)
Hemoglobin: 12.7 g/dL (ref 12.0–15.0)
MCH: 30.8 pg (ref 26.0–34.0)
MCHC: 32.9 g/dL (ref 30.0–36.0)
MCV: 93.7 fL (ref 80.0–100.0)
Platelets: 201 10*3/uL (ref 150–400)
RBC: 4.12 MIL/uL (ref 3.87–5.11)
RDW: 12.8 % (ref 11.5–15.5)
WBC: 6.4 10*3/uL (ref 4.0–10.5)
nRBC: 0 % (ref 0.0–0.2)

## 2023-08-17 LAB — BASIC METABOLIC PANEL
Anion gap: 9 (ref 5–15)
BUN: 16 mg/dL (ref 8–23)
CO2: 24 mmol/L (ref 22–32)
Calcium: 9.6 mg/dL (ref 8.9–10.3)
Chloride: 104 mmol/L (ref 98–111)
Creatinine, Ser: 1.07 mg/dL — ABNORMAL HIGH (ref 0.44–1.00)
GFR, Estimated: 53 mL/min — ABNORMAL LOW (ref >60)
Glucose, Bld: 136 mg/dL — ABNORMAL HIGH (ref 70–99)
Potassium: 4.2 mmol/L (ref 3.5–5.1)
Sodium: 137 mmol/L (ref 135–145)

## 2023-08-17 MED ORDER — LABETALOL HCL 5 MG/ML IV SOLN
20.0000 mg | Freq: Once | INTRAVENOUS | Status: DC
  Filled 2023-08-17: qty 4

## 2023-08-17 MED ORDER — OXYCODONE-ACETAMINOPHEN 5-325 MG PO TABS
1.0000 | ORAL_TABLET | Freq: Once | ORAL | Status: DC
  Filled 2023-08-17: qty 1

## 2023-08-17 MED ORDER — LABETALOL HCL 5 MG/ML IV SOLN: 10.0000 mg | Freq: Once | INTRAVENOUS | Status: DC

## 2023-08-17 NOTE — ED Triage Notes (Signed)
Pt arrived POV from home c/o heart palpitations and HTN today. Pt states she was just in the hospital last week but her BP has been up and down today.

## 2023-08-17 NOTE — ED Provider Notes (Incomplete)
[place] EMERGENCY DEPARTMENT AT [facility] Provider Note   CSN: [id number] Arrival date & time: 08/17/23  2142     History {Add pertinent medical, surgical, social history, OB history to HPI:1} Chief Complaint  Patient presents with   Hypertension   Palpitations    [name] is a 82 y.o. female, history of hypertension, CVA, diabetes, who presents to the ED secondary to increased fatigue for the last few days.  She states her blood pressure yesterday was fine, but today it has been very high.  States that she was started on losartan 2 days ago, by her cardiologist, but is not on any other blood pressure medications.    Notes that she is also having feelings of palpitations, that her heart is quivering, about 60% of the time, today in the about 30% of the time yesterday.  Notes that she has a loop recorder for syncopal episodes, and is generally anticoagulants.  Has been going compliant with Plavix and aspirin.     Waiting on a repeat just dx'd with afib in hospital per patient  Home Medications Prior to Admission medications   Medication Sig Start Date End Date Taking? Authorizing Provider  acetaminophen (TYLENOL) 500 MG tablet Take 1,000 mg by mouth 2 (two) times daily as needed for headache (back and knee pain).    [provider]  albuterol (VENTOLIN HFA) 108 (90 Base) MCG/ACT inhaler Inhale 1 puff into the lungs as needed for wheezing or shortness of breath. 01/29/22   [provider]  ascorbic acid (VITAMIN C) 500 MG tablet Take 1 tablet (500 mg total) by mouth daily. 10/23/22   Arnetha Courser, MD  aspirin EC 81 MG tablet Take 1 tablet (81 mg total) by mouth daily for 21 days. For 3 weeks only. 08/12/23 09/02/23  Osvaldo Shipper, MD  brimonidine (ALPHAGAN) 0.2 % ophthalmic solution Place 1 drop into both eyes 2 (two) times daily. 09/26/22   [provider]  cetirizine (ZYRTEC) 10 MG tablet Take 10 mg by mouth at bedtime.    [provider]  Cholecalciferol (VITAMIN D3) 250 MCG (10000 UT) capsule Take 10,000 Units by mouth daily.    [provider]  clopidogrel (PLAVIX) 75 MG tablet Take 1 tablet (75 mg total) by mouth daily. 08/08/23   Leroy Sea, MD  dexlansoprazole (DEXILANT) 60 MG capsule Take 1 capsule (60 mg total) by mouth daily. 01/09/23   Arnaldo Natal, NP  ezetimibe (ZETIA) 10 MG tablet Take 1 tablet (10 mg total) by mouth daily. 08/09/23   Leroy Sea, MD  hydrocortisone (ANUSOL-HC) 25 MG suppository Place 1 suppository (25 mg total) rectally at bedtime. 01/09/23   Arnaldo Natal, NP  ketoconazole (NIZORAL) 2 % cream Apply 1 Application topically daily. 05/17/23   Louann Sjogren, DPM  latanoprost (XALATAN) 0.005 % ophthalmic solution Place 1 drop into the right eye at bedtime. 06/10/23   [provider]  losartan (COZAAR) 25 MG tablet Take 1 tablet (25 mg total) by mouth daily. 08/12/23 11/10/23  Osvaldo Shipper, MD  metFORMIN (GLUCOPHAGE) 500 MG tablet Take 500 mg by mouth every morning. 08/01/23   [provider]  montelukast (SINGULAIR) 10 MG tablet Take 10 mg by mouth at bedtime.    [provider]  nystatin ointment (MYCOSTATIN) Apply 1 application  topically See admin instructions. 1 application 1-2 times a day as needed for rash 11/20/16   [provider]  Polyethyl Glycol-Propyl Glycol (SYSTANE OP) Place  1 drop into both eyes daily as needed (dry/irritated eyes).    [provider]  polyethylene glycol powder (GLYCOLAX/MIRALAX) 17 GM/SCOOP powder Take 17 g by mouth daily. 08/12/23   Osvaldo Shipper, MD  rosuvastatin (CRESTOR) 40 MG tablet Take 40 mg by mouth at bedtime.    [provider]  senna-docusate (SENOKOT-S) 8.6-50 MG tablet Take 2 tablets by mouth 2 (two) times daily. 08/12/23   Osvaldo Shipper, MD  sertraline (ZOLOFT) 100 MG tablet Take 100 mg by mouth every morning. 03/29/22   [provider]   sucralfate (CARAFATE) 1 g tablet Take 1 tablet (1 g total) by mouth 2 (two) times daily as needed. Do not take within 2 hours of any other medications. 01/09/23   Arnaldo Natal, NP  traMADol (ULTRAM) 50 MG tablet Take 0.5 tablets (25 mg total) by mouth 3 (three) times daily as needed. 08/12/23   Osvaldo Shipper, MD  zinc sulfate 220 (50 Zn) MG capsule Take 1 capsule (220 mg total) by mouth daily. 10/23/22   Arnetha Courser, MD      Allergies    Adalat [nifedipine], Hydrocodone, Meperidine hcl, and Naproxen    Review of Systems   Review of Systems  Respiratory:  Negative for shortness of breath.   Cardiovascular:  Positive for palpitations. Negative for chest pain.    Physical Exam Updated Vital Signs BP (!) 220/104   Pulse 73   Temp 98.5 F (36.9 C) (Oral)   Resp 18   Ht 5' 10.5" (1.791 m)   Wt 97.1 kg   SpO2 98%   BMI 30.27 kg/m  Physical Exam Vitals and nursing note reviewed.  Constitutional:      General: She is not in acute distress.    Appearance: She is well-developed.  HENT:     Head: Normocephalic and atraumatic.  Eyes:     Conjunctiva/sclera: Conjunctivae normal.  Cardiovascular:     Rate and Rhythm: Normal rate and regular rhythm.     Heart sounds: No murmur heard. Pulmonary:     Effort: Pulmonary effort is normal. No respiratory distress.     Breath sounds: Normal breath sounds.  Abdominal:     Palpations: Abdomen is soft.     Tenderness: There is no abdominal tenderness.  Musculoskeletal:        General: No swelling.     Cervical back: Neck supple.  Skin:    General: Skin is warm and dry.     Capillary Refill: Capillary refill takes less than 2 seconds.  Neurological:     Mental Status: She is alert.  Psychiatric:        Mood and Affect: Mood normal.     ED Results / Procedures / Treatments   Labs (all labs ordered are listed, but only abnormal results are displayed) Labs Reviewed  BASIC METABOLIC PANEL  CBC     EKG None  Radiology No results found.  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 Butch Penny on 10/25.  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 Labs: ordered. Radiology: ordered.  Risk Prescription drug management.   ***  {Document critical care time when appropriate:1} {Document review of labs and clinical decision tools ie heart score, Chads2Vasc2 etc:1}  {Document your independent review of radiology images, and any outside records:1} {Document your discussion with family members, caretakers, and with consultants:1} {Document social determinants of health affecting pt's care:1} {Document your decision making why or why not admission, treatments were needed:1} Final Clinical Impression(s) / ED Diagnoses Final diagnoses:  None    Rx / DC Orders ED Discharge Orders     None

## 2023-08-18 DIAGNOSIS — I10 Essential (primary) hypertension: Secondary | ICD-10-CM | POA: Diagnosis not present

## 2023-08-18 MED ORDER — LABETALOL HCL 5 MG/ML IV SOLN
10.0000 mg | Freq: Once | INTRAVENOUS | Status: AC
  Administered 2023-08-18: 10 mg via INTRAVENOUS

## 2023-08-18 MED ORDER — ACETAMINOPHEN 500 MG PO TABS
1000.0000 mg | ORAL_TABLET | Freq: Once | ORAL | Status: AC
  Administered 2023-08-18: 1000 mg via ORAL
  Filled 2023-08-18: qty 2

## 2023-08-18 NOTE — ED Notes (Signed)
PACEMAKER INTERROGATED AND SENT

## 2023-08-18 NOTE — Discharge Instructions (Addendum)
Please follow-up with your primary care doctor and your cardiologist.  Take your blood pressure daily, and make sure you keep your pain under control for your left foot, as this may cause your blood pressure to be higher.  Return to the ER if you have any weakness, difficulty speech, confusion, chest pain, or shortness of breath.  We interrogated your loop recorder and all we had was PVCs.  These are not harmful abnormal beats.

## 2023-08-19 ENCOUNTER — Encounter: Payer: Self-pay | Admitting: Cardiology

## 2023-08-19 DIAGNOSIS — I69398 Other sequelae of cerebral infarction: Secondary | ICD-10-CM | POA: Diagnosis not present

## 2023-08-19 DIAGNOSIS — R200 Anesthesia of skin: Secondary | ICD-10-CM | POA: Diagnosis not present

## 2023-08-19 DIAGNOSIS — Z95818 Presence of other cardiac implants and grafts: Secondary | ICD-10-CM | POA: Diagnosis not present

## 2023-08-19 DIAGNOSIS — Z48812 Encounter for surgical aftercare following surgery on the circulatory system: Secondary | ICD-10-CM | POA: Diagnosis not present

## 2023-08-19 DIAGNOSIS — S92352D Displaced fracture of fifth metatarsal bone, left foot, subsequent encounter for fracture with routine healing: Secondary | ICD-10-CM | POA: Diagnosis not present

## 2023-08-19 DIAGNOSIS — R29898 Other symptoms and signs involving the musculoskeletal system: Secondary | ICD-10-CM | POA: Diagnosis not present

## 2023-08-20 ENCOUNTER — Ambulatory Visit: Payer: Medicare Other | Attending: Cardiology | Admitting: Cardiology

## 2023-08-20 ENCOUNTER — Encounter: Payer: Self-pay | Admitting: Cardiology

## 2023-08-20 VITALS — BP 148/76 | HR 62 | Ht 70.0 in | Wt 214.0 lb

## 2023-08-20 DIAGNOSIS — E782 Mixed hyperlipidemia: Secondary | ICD-10-CM | POA: Insufficient documentation

## 2023-08-20 DIAGNOSIS — Z8673 Personal history of transient ischemic attack (TIA), and cerebral infarction without residual deficits: Secondary | ICD-10-CM | POA: Diagnosis not present

## 2023-08-20 DIAGNOSIS — I10 Essential (primary) hypertension: Secondary | ICD-10-CM | POA: Insufficient documentation

## 2023-08-20 MED ORDER — LOSARTAN POTASSIUM 50 MG PO TABS: 50.0000 mg | ORAL_TABLET | Freq: Two times a day (BID) | ORAL | 3 refills | Status: DC

## 2023-08-20 NOTE — Patient Instructions (Signed)
Medication Instructions:  Your physician has recommended you make the following change in your medication:  START: Losartan 50 mg twice daily *If you need a refill on your cardiac medications before your next appointment, please call your pharmacy*   Lab Work: None   Testing/Procedures: None   Follow-Up: At [facility], you and your health needs are our priority.  As part of our continuing mission to provide you with exceptional heart care, we have created designated Provider Care Teams.  These Care Teams include your primary Cardiologist (physician) and Advanced Practice Providers (APPs -  Physician Assistants and Nurse Practitioners) who all work together to provide you with the care you need, when you need it.   Your next appointment:   4 week(s)  Provider:   Thomasene Ripple, DO

## 2023-08-21 DIAGNOSIS — I10 Essential (primary) hypertension: Secondary | ICD-10-CM | POA: Diagnosis not present

## 2023-08-21 DIAGNOSIS — R600 Localized edema: Secondary | ICD-10-CM | POA: Diagnosis not present

## 2023-08-21 DIAGNOSIS — F321 Major depressive disorder, single episode, moderate: Secondary | ICD-10-CM | POA: Diagnosis not present

## 2023-08-21 DIAGNOSIS — E1165 Type 2 diabetes mellitus with hyperglycemia: Secondary | ICD-10-CM | POA: Diagnosis not present

## 2023-08-21 DIAGNOSIS — R630 Anorexia: Secondary | ICD-10-CM | POA: Diagnosis not present

## 2023-08-21 NOTE — Progress Notes (Signed)
[facility] Summary Report / Loop Recorder 

## 2023-08-22 DIAGNOSIS — R29898 Other symptoms and signs involving the musculoskeletal system: Secondary | ICD-10-CM | POA: Diagnosis not present

## 2023-08-22 DIAGNOSIS — R200 Anesthesia of skin: Secondary | ICD-10-CM | POA: Diagnosis not present

## 2023-08-22 DIAGNOSIS — Z48812 Encounter for surgical aftercare following surgery on the circulatory system: Secondary | ICD-10-CM | POA: Diagnosis not present

## 2023-08-22 DIAGNOSIS — S92352D Displaced fracture of fifth metatarsal bone, left foot, subsequent encounter for fracture with routine healing: Secondary | ICD-10-CM | POA: Diagnosis not present

## 2023-08-22 DIAGNOSIS — I69398 Other sequelae of cerebral infarction: Secondary | ICD-10-CM | POA: Diagnosis not present

## 2023-08-22 DIAGNOSIS — Z95818 Presence of other cardiac implants and grafts: Secondary | ICD-10-CM | POA: Diagnosis not present

## 2023-08-23 ENCOUNTER — Other Ambulatory Visit: Payer: Self-pay

## 2023-08-23 DIAGNOSIS — I6521 Occlusion and stenosis of right carotid artery: Secondary | ICD-10-CM

## 2023-08-24 NOTE — Progress Notes (Signed)
Cardiology Office Note:    Date:  08/24/2023   ID:  [name], DOB [birth year], MRN [id number]  PCP:  Harvest Forest, MD  Cardiologist:  Thomasene Ripple, DO  Electrophysiologist:  None   Referring MD: Harvest Forest, MD   " I am having shortness of breath"   History of Present Illness:    [name] is a 82 y.o. female with a hx of hypertension, occasional PVC , asthma, anxiety, GERD, diabetes mellitus, fibromyalgia here today for follow-up visit.  Since her last visit with me she has followed in our office. She saw Ida Rogue on 08/05/2023 at that time she has just had a syncope episode. A MRI was done - ILR interrogation did not show any arrhythmia.  Her blood pressure was elevated and discussion on medication was held.   She was admitted the next day for a CVA - MRI while inpatient showed mutiple infarct. Her medications were adjusted. No arrhythmia noted whille inpatient.   She tells me that she was seen in the ED on 08/17/2023 for palpitations - she tells me that they told her she was in afib but I do not see any evidence of this.  She her with her husband.   She has been experiencing fluctuating blood pressure despite being on Losartan. The patient reports feeling weak and having a poor appetite, consuming mostly water and Boost nutritional drinks. The patient also has a broken foot, which is causing some swelling. The patient has started physical and occupational therapy at home since being discharged from the hospital a week ago. The patient also mentions a recent diagnosis of atrial fibrillation (AFib) during a hospital visit, which has been causing some discomfort and unusual sensations.  Past Medical History:  Diagnosis Date   Allergy    Anal or rectal pain    sometimes   Anemia    hx of during pregnancy   Anxiety    Arthritis    Asthma    hx of   Bradycardia    " I KNOW I HAVE BRADYCARDIA ESPECIALLY WHEN I SLEEP"    Cataract 2021   bilateral eyes    Chronic back pain    Degenerative joint disease    osteo   Depression    Diabetes mellitus without complication (HCC)    DM type II   Diverticulosis 2003   Dysrhythmia    hx of  due to eye drop and also low heart rate 40's per pt. Dr. Allyson Sabal follows   Elevated total protein    Esophageal dysmotility    Fibromyalgia    GERD (gastroesophageal reflux disease)    subsequent Nissen Fundoplication   Glaucoma    Hearing loss    Heart murmur    "was told she had a heart murmur"   Hemorrhoids    Hiatal hernia 11/08/2009   Hx of adenomatous colonic polyps 07/02/2002   Hypercalcemia    Hyperlipidemia    Hyperlipidemia    Hypertension    Implantable loop recorder present 11/28/2021   Nausea    Osteoporosis    PONV (postoperative nausea and vomiting)    Rectal bleeding    from hemorrhoids.     Sleep apnea    DOES USE CPAP    Thrombocytopenia (HCC)    Varicose veins of left lower extremity     Past Surgical History:  Procedure Laterality Date   CARDIAC CATHETERIZATION  03/14/1992   Normal cardiac cath. Normal LV function.   CARDIOVASCULAR  STRESS TEST  01/22/2011   No scintigraphic evidence of inducible ischemia.   CAROTID DOPPLER  03/31/2007   Bilateral ICAs - no evidence of significant diameter reduction, dissectin, tortuosity, FMD, or any other vascular abnormality.   CATARACT EXTRACTION, BILATERAL     ESOPHAGEAL MANOMETRY N/A 11/14/2015   Procedure: ESOPHAGEAL MANOMETRY (EM);  Surgeon: Napoleon Form, MD;  Location: [facility];  Service: Endoscopy;  Laterality: N/A;   ESOPHAGEAL MANOMETRY N/A 01/18/2021   Procedure: ESOPHAGEAL MANOMETRY (EM);  Surgeon: Napoleon Form, MD;  Location: [facility];  Service: Endoscopy;  Laterality: N/A;   ESOPHAGOGASTRODUODENOSCOPY N/A 03/08/2021   Procedure: ESOPHAGOGASTRODUODENOSCOPY (EGD);  Surgeon: Corliss Skains, MD;  Location[facility];  Service: Thoracic;  Laterality: N/A;   EYE SURGERY     bilateral cataracts; bilateral stents    GASTRIC RESECTION  2009   GLAUCOMA SURGERY     JOINT REPLACEMENT     right knee Dr. Lequita Halt 06-23-18   KNEE SURGERY Bilateral    NISSEN FUNDOPLICATION  2000   with subsequent takedown in 2009   TOTAL KNEE ARTHROPLASTY Left 06/10/2017   Procedure: LEFT  TOTAL KNEE ARTHROPLASTY;  Surgeon: Ollen Gross, MD;  Location: [facility];  Service: Orthopedics;  Laterality: Left;  Adductor Block   TOTAL KNEE ARTHROPLASTY Right 06/23/2018   Procedure: RIGHT TOTAL KNEE ARTHROPLASTY;  Surgeon: Ollen Gross, MD;  Location: [facility];  Service: Orthopedics;  Laterality: Right;   TRANSCAROTID ARTERY REVASCULARIZATION  Right 08/09/2023   Procedure: TRANSCAROTID ARTERY REVASCULARIZATION;  Surgeon: Leonie Douglas, MD;  Location: [facility] OR;  Service: Vascular;  Laterality: Right;   TRANSTHORACIC ECHOCARDIOGRAM  12/21/2010   EF 60%, moderate LVH,    TUBAL LIGATION     XI ROBOTIC ASSISTED HIATAL HERNIA REPAIR N/A 03/08/2021   Procedure: XI ROBOTIC ASSISTED LAPAROSCOPY WITH LYSIS OF ADHESIONS;  Surgeon: Corliss Skains, MD;  Location: [facility] OR;  Service: Thoracic;  Laterality: N/A;    Current Medications: Current Meds  Medication Sig   acetaminophen (TYLENOL) 500 MG tablet Take 1,000 mg by mouth 2 (two) times daily as needed for headache (back and knee pain).   albuterol (VENTOLIN HFA) 108 (90 Base) MCG/ACT inhaler Inhale 1 puff into the lungs as needed for wheezing or shortness of breath.   ascorbic acid (VITAMIN C) 500 MG tablet Take 1 tablet (500 mg total) by mouth daily.   aspirin EC 81 MG tablet Take 1 tablet (81 mg total) by mouth daily for 21 days. For 3 weeks only.   brimonidine (ALPHAGAN) 0.2 % ophthalmic solution Place 1 drop into both eyes 2 (two) times daily.   cetirizine (ZYRTEC) 10 MG tablet Take 10 mg by mouth at bedtime.   Cholecalciferol (VITAMIN D3) 250 MCG (10000 UT) capsule Take 10,000 Units by mouth daily.   clopidogrel (PLAVIX) 75 MG tablet Take 1 tablet (75 mg total) by mouth daily.    dexlansoprazole (DEXILANT) 60 MG capsule Take 1 capsule (60 mg total) by mouth daily.   ezetimibe (ZETIA) 10 MG tablet Take 1 tablet (10 mg total) by mouth daily.   hydrocortisone (ANUSOL-HC) 25 MG suppository Place 1 suppository (25 mg total) rectally at bedtime.   ketoconazole (NIZORAL) 2 % cream Apply 1 Application topically daily.   latanoprost (XALATAN) 0.005 % ophthalmic solution Place 1 drop into the right eye at bedtime.   losartan (COZAAR) 50 MG tablet Take 1 tablet (50 mg total) by mouth in the morning and at bedtime.   metFORMIN (GLUCOPHAGE) 500 MG tablet  Take 500 mg by mouth every morning.   montelukast (SINGULAIR) 10 MG tablet Take 10 mg by mouth at bedtime.   nystatin ointment (MYCOSTATIN) Apply 1 application  topically See admin instructions. 1 application 1-2 times a day as needed for rash   Polyethyl Glycol-Propyl Glycol (SYSTANE OP) Place 1 drop into both eyes daily as needed (dry/irritated eyes).   polyethylene glycol powder (GLYCOLAX/MIRALAX) 17 GM/SCOOP powder Take 17 g by mouth daily.   rosuvastatin (CRESTOR) 40 MG tablet Take 40 mg by mouth at bedtime.   senna-docusate (SENOKOT-S) 8.6-50 MG tablet Take 2 tablets by mouth 2 (two) times daily.   sertraline (ZOLOFT) 100 MG tablet Take 100 mg by mouth every morning.   sucralfate (CARAFATE) 1 g tablet Take 1 tablet (1 g total) by mouth 2 (two) times daily as needed. Do not take within 2 hours of any other medications.   traMADol (ULTRAM) 50 MG tablet Take 0.5 tablets (25 mg total) by mouth 3 (three) times daily as needed.   zinc sulfate 220 (50 Zn) MG capsule Take 1 capsule (220 mg total) by mouth daily.   [DISCONTINUED] losartan (COZAAR) 25 MG tablet Take 1 tablet (25 mg total) by mouth daily.     Allergies:   Adalat [nifedipine], Hydrocodone, Meperidine hcl, and Naproxen   Social History   Socioeconomic History   Marital status: Married    Spouse name: Not on file   Number of children: 8   Years of education: Not on  file   Highest education level: Not on file  Occupational History   Occupation: retired    Comment: retired Clinical biochemist.   Tobacco Use   Smoking status: Never   Smokeless tobacco: Never  Vaping Use   Vaping status: Never Used  Substance and Sexual Activity   Alcohol use: No    Alcohol/week: 0.0 standard drinks of alcohol   Drug use: No   Sexual activity: Yes  Other Topics Concern   Not on file  Social History Narrative   Husband, Dama Boggan is Next of Kin. Cell # [phone]   Social Determinants of Health   Financial Resource Strain: Not on file  Food Insecurity: No Food Insecurity (08/07/2023)   Hunger Vital Sign    Worried About Running Out of Food in the Last Year: Never true    Ran Out of Food in the Last Year: Never true  Transportation Needs: No Transportation Needs (08/07/2023)   PRAPARE - Administrator, Civil Service (Medical): No    Lack of Transportation (Non-Medical): No  Physical Activity: Not on file  Stress: Not on file  Social Connections: Not on file     Family History: The patient's family history includes Breast cancer in her daughter; Cancer in her maternal grandmother; Diabetes in her father and mother; Heart disease in her mother; Hypertension in her mother; Kidney disease in her father and maternal grandfather; Leukemia in her maternal uncle; Prostate cancer in her maternal uncle; Stomach cancer in her maternal aunt; Stroke in her brother; Uterine cancer in her maternal aunt. There is no history of Colon cancer, Rectal cancer, or Esophageal cancer.  ROS:   Review of Systems  Constitution: Negative for decreased appetite, fever and weight gain.  HENT: Negative for congestion, ear discharge, hoarse voice and sore throat.   Eyes: Negative for discharge, redness, vision loss in right eye and visual halos.  Cardiovascular: Reports dyspnea exertion.  Negative for chest pain, leg swelling, orthopnea and palpitations.  Respiratory:  Negative  for cough, hemoptysis, shortness of breath and snoring.   Endocrine: Negative for heat intolerance and polyphagia.  Hematologic/Lymphatic: Negative for bleeding problem. Does not bruise/bleed easily.  Skin: Negative for flushing, nail changes, rash and suspicious lesions.  Musculoskeletal: Negative for arthritis, joint pain, muscle cramps, myalgias, neck pain and stiffness.  Gastrointestinal: Negative for abdominal pain, bowel incontinence, diarrhea and excessive appetite.  Genitourinary: Negative for decreased libido, genital sores and incomplete emptying.  Neurological: Negative for brief paralysis, focal weakness, headaches and loss of balance.  Psychiatric/Behavioral: Negative for altered mental status, depression and suicidal ideas.  Allergic/Immunologic: Negative for HIV exposure and persistent infections.    EKGs/Labs/Other Studies Reviewed:    The following studies were reviewed today:   EKG:  The ekg ordered today demonstrates sinus rhythm, heart rate 60 bpm  CTA of the chest December 2023  IMPRESSION: 1. No evidence of pulmonary embolism. 2. Suspect mild pulmonary interstitial edema. 3. Borderline enlarged main pulmonary artery suggesting pulmonary hypertension. Additionally, there is cardiomegaly with reflux of contrast into the IVC/hepatic veins suggesting right heart dysfunction. 4. Unchanged hiatal hernia.     Electronically Signed   By: Lesia Hausen M.D.   On: 10/04/2022 14:03    Coronary CTA February 2023 FINDINGS: Non-cardiac: See separate report from [facility]. No significant findings on limited lung and soft tissue windows.   Calcium Score: No calcium noted in coronary arteries   Coronary Arteries: Right dominant with no anomalies   LM: Normal   LAD: Normal   D1: Normal   D2: Normal   Circumflex: Normal   OM1: Normal   OM2: Normal   RCA: Normal   PDA: Normal   PLA: Normal   IMPRESSION: 1. Calcium score 0   2.  Normal  right dominant coronary arteries   3.  Normal ascending thoracic aorta 3.6 cm   Charlton Haws   Electronically Signed: By: Charlton Haws M.D. On: 12/04/2021 12:58    Echocardiogram February 2023 IMPRESSIONS   1. Left ventricular ejection fraction, by estimation, is 60 to 65%. The  left ventricle has normal function. The left ventricle has no regional  wall motion abnormalities. There is mild concentric left ventricular  hypertrophy. Left ventricular diastolic  parameters are consistent with Grade I diastolic dysfunction (impaired  relaxation).   2. Right ventricular systolic function is normal. The right ventricular  size is normal.   3. Left atrial size was mild to moderately dilated.   4. The mitral valve is normal in structure. Trivial mitral valve  regurgitation. No evidence of mitral stenosis.   5. The aortic valve is tricuspid. Aortic valve regurgitation is not  visualized. No aortic stenosis is present.   6. Aortic dilatation noted. There is borderline dilatation of the  ascending aorta, measuring 36 mm.   7. The inferior vena cava is normal in size with greater than 50%  respiratory variability, suggesting right atrial pressure of 3 mmHg.   Recent Labs: 10/11/2022: NT-Pro BNP 181 10/22/2022: TSH 0.303 08/06/2023: ALT 20 08/11/2023: B Natriuretic Peptide 202.0; Magnesium 2.0 08/17/2023: BUN 16; Creatinine, Ser 1.07; Hemoglobin 12.7; Platelets 201; Potassium 4.2; Sodium 137  Recent Lipid Panel    Component Value Date/Time   CHOL 135 08/10/2023 0545   CHOL 200 (H) 10/26/2020 0942   TRIG 30 08/10/2023 0545   HDL 58 08/10/2023 0545   HDL 76 10/26/2020 0942   CHOLHDL 2.3 08/10/2023 0545   VLDL 6 08/10/2023 0545   LDLCALC 71 08/10/2023 0545  LDLCALC 115 (H) 10/26/2020 1610    Physical Exam:    VS:  BP (!) 148/76 (BP Location: Left Arm, Patient Position: Sitting, Cuff Size: Normal)   Pulse 62   Ht 5\' 10"  (1.778 m)   Wt 214 lb (97.1 kg)   SpO2 98%   BMI 30.71  kg/m     Wt Readings from Last 3 Encounters:  08/20/23 214 lb (97.1 kg)  08/17/23 214 lb (97.1 kg)  08/07/23 226 lb 13.7 oz (102.9 kg)     GEN: Well nourished, well developed in no acute distress HEENT: Normal NECK: Noted JVD; No carotid bruits, there is also suspicion of a small likely fat pad/cyst on palpation of the right neck LYMPHATICS: No lymphadenopathy CARDIAC: S1S2 noted,RRR, no murmurs, rubs, gallops RESPIRATORY:  Clear to auscultation without rales, wheezing or rhonchi  ABDOMEN: Soft, non-tender, non-distended, +bowel sounds, no guarding. EXTREMITIES: +2 bilateral lower extremity edema edema, No cyanosis, no clubbing MUSCULOSKELETAL:  No deformity  SKIN: Warm and dry NEUROLOGIC:  Alert and oriented x 3, non-focal PSYCHIATRIC:  Normal affect, good insight  ASSESSMENT:    1. Essential hypertension   2. Mixed hyperlipidemia   3. History of CVA (cerebrovascular accident)       PLAN:   Hypertension Blood pressure has been more elevated than controlled recently. Currently on Losartan 50mg . Discussed adding Hydrochlorothiazide 12.5mg , but decided against it due to patient's low fluid intake and weakness. -Increase Losartan to 50mg  twice daily. -Check blood pressure in 4 weeks.  Palpitaions  Patient reported being told she has AFib during a recent ED visit. She has a loop recorder implanted. -Interrogate loop recorder to confirm AFib diagnosis. -If AFib confirmed, will start anticoagulation with Eliquis.  Acute CVA - on antiplatelets and statin  Poor Appetite/Weight Loss Patient has poor appetite and is consuming minimal food and fluids. She is drinking 1-2 Boost drinks per day. -Encourage patient to eat anything she can tolerate, even if considered unhealthy. -Continue Boost drinks as tolerated.  Post-Hospitalization Rehabilitation Patient was recently discharged from the hospital and is receiving home physical and occupational therapy. -Continue with home  therapy as scheduled.  The patient understands the need to lose weight with diet and exercise. We have discussed specific strategies for this.   The patient is in agreement with the above plan. The patient left the office in stable condition.  The patient will follow up in 4 months or sooner if needed.   Medication Adjustments/Labs and Tests Ordered: Current medicines are reviewed at length with the patient today.  Concerns regarding medicines are outlined above.  No orders of the defined types were placed in this encounter.  Meds ordered this encounter  Medications   losartan (COZAAR) 50 MG tablet    Sig: Take 1 tablet (50 mg total) by mouth in the morning and at bedtime.    Dispense:  180 tablet    Refill:  3    Patient Instructions  Medication Instructions:  Your physician has recommended you make the following change in your medication:  START: Losartan 50 mg twice daily *If you need a refill on your cardiac medications before your next appointment, please call your pharmacy*   Lab Work: None   Testing/Procedures: None   Follow-Up: At [facility], you and your health needs are our priority.  As part of our continuing mission to provide you with exceptional heart care, we have created designated Provider Care Teams.  These Care Teams include your primary Cardiologist (physician) and  Advanced Practice Providers (APPs -  Physician Assistants and Nurse Practitioners) who all work together to provide you with the care you need, when you need it.   Your next appointment:   4 week(s)  Provider:   Thomasene Ripple, DO    Adopting a Healthy Lifestyle.  Know what a healthy weight is for you (roughly BMI <25) and aim to maintain this   Aim for 7+ servings of fruits and vegetables daily   65-80+ fluid ounces of water or unsweet tea for healthy kidneys   Limit to max 1 drink of alcohol per day; avoid smoking/tobacco   Limit animal fats in diet for cholesterol and  heart health - choose grass fed whenever available   Avoid highly processed foods, and foods high in saturated/trans fats   Aim for low stress - take time to unwind and care for your mental health   Aim for 150 min of moderate intensity exercise weekly for heart health, and weights twice weekly for bone health   Aim for 7-9 hours of sleep daily   When it comes to diets, agreement about the perfect plan isnt easy to find, even among the experts. Experts at the [facility] of [employer] developed an idea known as the Healthy Eating Plate. Just imagine a plate divided into logical, healthy portions.   The emphasis is on diet quality:   Load up on vegetables and fruits - one-half of your plate: Aim for color and variety, and remember that potatoes dont count.   Go for whole grains - one-quarter of your plate: Whole wheat, barley, wheat berries, quinoa, oats, brown rice, and foods made with them. If you want pasta, go with whole wheat pasta.   Protein power - one-quarter of your plate: Fish, chicken, beans, and nuts are all healthy, versatile protein sources. Limit red meat.   The diet, however, does go beyond the plate, offering a few other suggestions.   Use healthy plant oils, such as olive, canola, soy, corn, sunflower and peanut. Check the labels, and avoid partially hydrogenated oil, which have unhealthy trans fats.   If youre thirsty, drink water. Coffee and tea are good in moderation, but skip sugary drinks and limit milk and dairy products to one or two daily servings.   The type of carbohydrate in the diet is more important than the amount. Some sources of carbohydrates, such as vegetables, fruits, whole grains, and beans-are healthier than others.   Finally, stay active  Signed, Thomasene Ripple, DO  08/24/2023 12:28 PM    [facility]

## 2023-08-26 DIAGNOSIS — R200 Anesthesia of skin: Secondary | ICD-10-CM | POA: Diagnosis not present

## 2023-08-26 DIAGNOSIS — F321 Major depressive disorder, single episode, moderate: Secondary | ICD-10-CM | POA: Diagnosis not present

## 2023-08-26 DIAGNOSIS — Z95818 Presence of other cardiac implants and grafts: Secondary | ICD-10-CM | POA: Diagnosis not present

## 2023-08-26 DIAGNOSIS — R29898 Other symptoms and signs involving the musculoskeletal system: Secondary | ICD-10-CM | POA: Diagnosis not present

## 2023-08-26 DIAGNOSIS — S92352D Displaced fracture of fifth metatarsal bone, left foot, subsequent encounter for fracture with routine healing: Secondary | ICD-10-CM | POA: Diagnosis not present

## 2023-08-26 DIAGNOSIS — I10 Essential (primary) hypertension: Secondary | ICD-10-CM | POA: Diagnosis not present

## 2023-08-26 DIAGNOSIS — I69398 Other sequelae of cerebral infarction: Secondary | ICD-10-CM | POA: Diagnosis not present

## 2023-08-26 DIAGNOSIS — Z48812 Encounter for surgical aftercare following surgery on the circulatory system: Secondary | ICD-10-CM | POA: Diagnosis not present

## 2023-08-27 DIAGNOSIS — Z95818 Presence of other cardiac implants and grafts: Secondary | ICD-10-CM | POA: Diagnosis not present

## 2023-08-27 DIAGNOSIS — R29898 Other symptoms and signs involving the musculoskeletal system: Secondary | ICD-10-CM | POA: Diagnosis not present

## 2023-08-27 DIAGNOSIS — I69398 Other sequelae of cerebral infarction: Secondary | ICD-10-CM | POA: Diagnosis not present

## 2023-08-27 DIAGNOSIS — S92352D Displaced fracture of fifth metatarsal bone, left foot, subsequent encounter for fracture with routine healing: Secondary | ICD-10-CM | POA: Diagnosis not present

## 2023-08-27 DIAGNOSIS — R200 Anesthesia of skin: Secondary | ICD-10-CM | POA: Diagnosis not present

## 2023-08-27 DIAGNOSIS — Z48812 Encounter for surgical aftercare following surgery on the circulatory system: Secondary | ICD-10-CM | POA: Diagnosis not present

## 2023-08-28 ENCOUNTER — Ambulatory Visit (INDEPENDENT_AMBULATORY_CARE_PROVIDER_SITE_OTHER): Payer: Medicare Other | Admitting: Podiatry

## 2023-08-28 ENCOUNTER — Ambulatory Visit (INDEPENDENT_AMBULATORY_CARE_PROVIDER_SITE_OTHER): Payer: Medicare Other

## 2023-08-28 ENCOUNTER — Ambulatory Visit: Payer: Medicare Other | Admitting: Cardiology

## 2023-08-28 DIAGNOSIS — S92355A Nondisplaced fracture of fifth metatarsal bone, left foot, initial encounter for closed fracture: Secondary | ICD-10-CM | POA: Diagnosis not present

## 2023-08-28 DIAGNOSIS — M2142 Flat foot [pes planus] (acquired), left foot: Secondary | ICD-10-CM

## 2023-08-28 DIAGNOSIS — E1142 Type 2 diabetes mellitus with diabetic polyneuropathy: Secondary | ICD-10-CM

## 2023-08-28 DIAGNOSIS — I739 Peripheral vascular disease, unspecified: Secondary | ICD-10-CM

## 2023-08-28 DIAGNOSIS — M2141 Flat foot [pes planus] (acquired), right foot: Secondary | ICD-10-CM | POA: Diagnosis not present

## 2023-08-28 NOTE — Progress Notes (Signed)
Subjective:  Patient ID: [name], female    DOB: [birth year],   MRN: [id number]  No chief complaint on file.   82 y.o. female presents for follow-up of fifth metatarsal fracture. Relates she is doing ok. Still getting pain. She did have a stroke since I last saw her. Relates she has been in the boot. Hoping to discuss DM shoes today. Relates burning and tingling in their feet. Patient is diabetic and last A1c was  Lab Results  Component Value Date   HGBA1C 6.8 (H) 08/07/2023   .   PCP:  Harvest Forest, MD     PCP:  Harvest Forest, MD    . Denies any other pedal complaints. Denies n/v/f/c.   Past Medical History:  Diagnosis Date   Allergy    Anal or rectal pain    sometimes   Anemia    hx of during pregnancy   Anxiety    Arthritis    Asthma    hx of   Bradycardia    " I KNOW I HAVE BRADYCARDIA ESPECIALLY WHEN I SLEEP"    Cataract 2021   bilateral eyes   Chronic back pain    Degenerative joint disease    osteo   Depression    Diabetes mellitus without complication (HCC)    DM type II   Diverticulosis 2003   Dysrhythmia    hx of  due to eye drop and also low heart rate 40's per pt. Dr. Allyson Sabal follows   Elevated total protein    Esophageal dysmotility    Fibromyalgia    GERD (gastroesophageal reflux disease)    subsequent Nissen Fundoplication   Glaucoma    Hearing loss    Heart murmur    "was told she had a heart murmur"   Hemorrhoids    Hiatal hernia 11/08/2009   Hx of adenomatous colonic polyps 07/02/2002   Hypercalcemia    Hyperlipidemia    Hyperlipidemia    Hypertension    Implantable loop recorder present 11/28/2021   Nausea    Osteoporosis    PONV (postoperative nausea and vomiting)    Rectal bleeding    from hemorrhoids.     Sleep apnea    DOES USE CPAP    Thrombocytopenia (HCC)    Varicose veins of left lower extremity     Objective:  Physical Exam: Vascular: DP/PT pulses 1/4 bilateral. CFT <3 seconds. Normal hair growth on  digits. No edema.  Skin. No lacerations or abrasions bilateral feet. Nails 1-5 bilateral are thickened elongated and with subungual debris. Scaling and erythema noted to mostly left plantar feet.  Musculoskeletal: MMT 5/5 bilateral lower extremities in DF, PF, Inversion and Eversion. Deceased ROM in DF of ankle joint. Collapse of medial arch bilateral with eversion of calcaneus. Tender to the latearl aspect of the foot around the fifth metatarsal diaphysis and proximally along the peroneal tendon insertion. No pain with ROM of the fifth digit. Most pain over proximal process of fifth metatarsal.  Neurological: Sensation intact to light touch.   Assessment:   1. Closed nondisplaced fracture of fifth metatarsal bone of left foot, initial encounter   2. Type 2 diabetes mellitus with diabetic polyneuropathy, without long-term current use of insulin (HCC)   3. PVD (peripheral vascular disease) (HCC)   4. Bilateral pes planus       Plan:  Patient was evaluated and treated and all questions answered. -Mechanically debrided all nails 1-5 bilateral using sterile nail nipper and filed  with dremel without incident  -Answered all patient questions -Xrays reviewed. Acute fracture noted to proximal fifth metatarsal noted through lateral half of bone just distal to tarsometatarsal joint. Some gapping noted but mild healing callus formation noted.  -Discussed treatement options for fifth metatarsal fracture; risks, alternatives, and benefits explained. -Discussed this type of fracture is typically surgical corrected but discussed given history of PVD would be risky to try and correct and issues with healing. Discussed conservative treatments for now.  -Continue CAM boot.  -Recommend protection, rest, ice, elevation daily until symptoms improve -Tylenol as needed.  --Discussed and educated patient on diabetic foot care, especially with  regards to the vascular, neurological and musculoskeletal systems.   -Stressed the importance of good glycemic control and the detriment of not  controlling glucose levels in relation to the foot. -Discussed supportive shoes at all times and checking feet regularly.  -DM shoe fitting to be shcedueld.  -Patient to return to office in 4 weeks for serial x-rays to assess healing  or sooner if condition worsens.   Louann Sjogren, DPM

## 2023-08-30 ENCOUNTER — Telehealth: Payer: Self-pay

## 2023-08-30 DIAGNOSIS — I69398 Other sequelae of cerebral infarction: Secondary | ICD-10-CM | POA: Diagnosis not present

## 2023-08-30 DIAGNOSIS — R29898 Other symptoms and signs involving the musculoskeletal system: Secondary | ICD-10-CM | POA: Diagnosis not present

## 2023-08-30 DIAGNOSIS — R200 Anesthesia of skin: Secondary | ICD-10-CM | POA: Diagnosis not present

## 2023-08-30 DIAGNOSIS — Z95818 Presence of other cardiac implants and grafts: Secondary | ICD-10-CM | POA: Diagnosis not present

## 2023-08-30 DIAGNOSIS — S92352D Displaced fracture of fifth metatarsal bone, left foot, subsequent encounter for fracture with routine healing: Secondary | ICD-10-CM | POA: Diagnosis not present

## 2023-08-30 DIAGNOSIS — Z48812 Encounter for surgical aftercare following surgery on the circulatory system: Secondary | ICD-10-CM | POA: Diagnosis not present

## 2023-08-30 NOTE — Telephone Encounter (Signed)
Cheryl Endo, RN with [facility] called and left a message - Patient reported that she was to stay off her foot as much as possible (fracture isn't healing). [name] wanted to know if there are any WB restrictions they need to be aware of - please advise - thanks

## 2023-09-02 NOTE — Telephone Encounter (Signed)
She is allowed to weight bear as needed but minimally so to get food and go to the bathroom.

## 2023-09-03 ENCOUNTER — Telehealth: Payer: Self-pay

## 2023-09-03 DIAGNOSIS — M19011 Primary osteoarthritis, right shoulder: Secondary | ICD-10-CM | POA: Diagnosis not present

## 2023-09-03 DIAGNOSIS — I10 Essential (primary) hypertension: Secondary | ICD-10-CM | POA: Diagnosis not present

## 2023-09-03 DIAGNOSIS — Z48812 Encounter for surgical aftercare following surgery on the circulatory system: Secondary | ICD-10-CM | POA: Diagnosis not present

## 2023-09-03 DIAGNOSIS — R630 Anorexia: Secondary | ICD-10-CM | POA: Diagnosis not present

## 2023-09-03 DIAGNOSIS — Z95818 Presence of other cardiac implants and grafts: Secondary | ICD-10-CM | POA: Diagnosis not present

## 2023-09-03 DIAGNOSIS — R200 Anesthesia of skin: Secondary | ICD-10-CM | POA: Diagnosis not present

## 2023-09-03 DIAGNOSIS — I69398 Other sequelae of cerebral infarction: Secondary | ICD-10-CM | POA: Diagnosis not present

## 2023-09-03 DIAGNOSIS — S92352D Displaced fracture of fifth metatarsal bone, left foot, subsequent encounter for fracture with routine healing: Secondary | ICD-10-CM | POA: Diagnosis not present

## 2023-09-03 DIAGNOSIS — E7849 Other hyperlipidemia: Secondary | ICD-10-CM | POA: Diagnosis not present

## 2023-09-03 DIAGNOSIS — I63131 Cerebral infarction due to embolism of right carotid artery: Secondary | ICD-10-CM | POA: Diagnosis not present

## 2023-09-03 DIAGNOSIS — R29898 Other symptoms and signs involving the musculoskeletal system: Secondary | ICD-10-CM | POA: Diagnosis not present

## 2023-09-03 NOTE — Telephone Encounter (Signed)
Transition Care Management Unsuccessful Follow-up Telephone Call  Date of discharge and from where:  08/18/2023 [facility]  Attempts:  2nd Attempt  Reason for unsuccessful TCM follow-up call:  No answer/busy unable to leave voicemail.  Sherrine Salberg [facility]  [facility] Guide Direct Dial: [phone]  Website: [email or web address]

## 2023-09-03 NOTE — Telephone Encounter (Signed)
Transition Care Management Unsuccessful Follow-up Telephone Call  Date of discharge and from where:  08/18/2023 [facility]  Attempts:  1st Attempt  Reason for unsuccessful TCM follow-up call:  No answer/busy  Kaisley Stiverson [facility]  [facility], [facility] Resource Care Guide Direct Dial: [phone]  Website: [email or web address]

## 2023-09-05 ENCOUNTER — Telehealth: Payer: Self-pay | Admitting: Podiatry

## 2023-09-05 DIAGNOSIS — R200 Anesthesia of skin: Secondary | ICD-10-CM | POA: Diagnosis not present

## 2023-09-05 DIAGNOSIS — Z95818 Presence of other cardiac implants and grafts: Secondary | ICD-10-CM | POA: Diagnosis not present

## 2023-09-05 DIAGNOSIS — R29898 Other symptoms and signs involving the musculoskeletal system: Secondary | ICD-10-CM | POA: Diagnosis not present

## 2023-09-05 DIAGNOSIS — Z48812 Encounter for surgical aftercare following surgery on the circulatory system: Secondary | ICD-10-CM | POA: Diagnosis not present

## 2023-09-05 DIAGNOSIS — S92352D Displaced fracture of fifth metatarsal bone, left foot, subsequent encounter for fracture with routine healing: Secondary | ICD-10-CM | POA: Diagnosis not present

## 2023-09-05 DIAGNOSIS — I69398 Other sequelae of cerebral infarction: Secondary | ICD-10-CM | POA: Diagnosis not present

## 2023-09-05 NOTE — Telephone Encounter (Signed)
Patient called stating she stepped in the wrong direction accidentally and ended up causing further pain in her foot, there is a constant burning sensation and she would like to know what it is she should do. She was in on the 13th for xrays and stated her foot is fractured. Please call and advise thank you so much

## 2023-09-06 ENCOUNTER — Ambulatory Visit (HOSPITAL_COMMUNITY): Admit: 2023-09-06 | Payer: Medicare Other | Admitting: Orthopedic Surgery

## 2023-09-06 ENCOUNTER — Ambulatory Visit (HOSPITAL_COMMUNITY)
Admission: RE | Admit: 2023-09-06 | Discharge: 2023-09-06 | Disposition: A | Discharge status: Home / Self Care | Payer: Medicare Other | Source: Ambulatory Visit | Attending: Vascular Surgery | Admitting: Vascular Surgery

## 2023-09-06 DIAGNOSIS — I6521 Occlusion and stenosis of right carotid artery: Secondary | ICD-10-CM | POA: Insufficient documentation

## 2023-09-06 SURGERY — ARTHROPLASTY, SHOULDER, TOTAL, REVERSE
Anesthesia: Choice | Site: Shoulder | Laterality: Right

## 2023-09-09 ENCOUNTER — Ambulatory Visit: Payer: Medicare Other

## 2023-09-09 DIAGNOSIS — R55 Syncope and collapse: Secondary | ICD-10-CM

## 2023-09-09 LAB — CUP PACEART REMOTE DEVICE CHECK
Date Time Interrogation Session: 20241122230343
Implantable Pulse Generator Implant Date: 20240222

## 2023-09-09 NOTE — Progress Notes (Unsigned)
[facility] [place]  ASSESSMENT / PLAN: [name] is a 82 y.o. female status post right TCAR 08/09/23    Recommend:  Abstinence from all tobacco products. Blood glucose control with goal A1c < 7%. Blood pressure control with goal blood pressure < 140/90 mmHg. Lipid reduction therapy with goal LDL-C <100 mg/dL  Aspirin 81mg  PO QD.  Clopidogrel 75mg  PO QD. Atorvastatin 40-80mg  PO QD (or other "high intensity" statin therapy).  Good technical result on duplex and clinically. Follow up with me in 9 months with repeat duplex.  CHIEF COMPLAINT: follow up surgery  HISTORY OF PRESENT ILLNESS: [name] is a 82 y.o. female admitted to the hospital with left-sided weakness. Stroke workup was initiated. 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 She reports crescendo events prior to her presenting weakness. I reviewed her angiographic findings with her in detail. We reviewed the 2 main modes of carotid artery revascularization. I ultimately recommended TCAR given the anatomic challenges with her carotid artery stenosis.   09/10/23:.  She has done quite well.  Her left foot is bothering her, but this is a chronic issue from fracture.  She has no new neurologic deficits.  We reviewed her duplex in detail.  Past Medical History:  Diagnosis Date   Allergy    Anal or rectal pain    sometimes   Anemia    hx of during pregnancy   Anxiety    Arthritis    Asthma    hx of   Bradycardia    " I KNOW I HAVE BRADYCARDIA ESPECIALLY WHEN I SLEEP"    Cataract 2021   bilateral eyes   Chronic back pain    Degenerative joint disease    osteo   Depression    Diabetes mellitus without complication (HCC)    DM type II   Diverticulosis 2003   Dysrhythmia    hx of  due to eye drop and also low heart rate 40's per pt. Dr. Allyson Sabal follows   Elevated total protein    Esophageal dysmotility     Fibromyalgia    GERD (gastroesophageal reflux disease)    subsequent Nissen Fundoplication   Glaucoma    Hearing loss    Heart murmur    "was told she had a heart murmur"   Hemorrhoids    Hiatal hernia 11/08/2009   Hx of adenomatous colonic polyps 07/02/2002   Hypercalcemia    Hyperlipidemia    Hyperlipidemia    Hypertension    Implantable loop recorder present 11/28/2021   Nausea    Osteoporosis    PONV (postoperative nausea and vomiting)    Rectal bleeding    from hemorrhoids.     Sleep apnea    DOES USE CPAP    Thrombocytopenia (HCC)    Varicose veins of left lower extremity     Past Surgical History:  Procedure Laterality Date   CARDIAC CATHETERIZATION  03/14/1992   Normal cardiac cath. Normal LV function.   CARDIOVASCULAR STRESS TEST  01/22/2011   No scintigraphic evidence of inducible ischemia.   CAROTID DOPPLER  03/31/2007   Bilateral ICAs - no evidence of significant diameter reduction, dissectin, tortuosity, FMD, or any other vascular abnormality.   CATARACT EXTRACTION, BILATERAL     ESOPHAGEAL MANOMETRY N/A 11/14/2015   Procedure: ESOPHAGEAL MANOMETRY (EM);  Surgeon: Napoleon Form, MD;  Location: [facility];  Service: Endoscopy;  Laterality: N/A;  ESOPHAGEAL MANOMETRY N/A 01/18/2021   Procedure: ESOPHAGEAL MANOMETRY (EM);  Surgeon: Napoleon Form, MD;  Location: [facility];  Service: Endoscopy;  Laterality: N/A;   ESOPHAGOGASTRODUODENOSCOPY N/A 03/08/2021   Procedure: ESOPHAGOGASTRODUODENOSCOPY (EGD);  Surgeon: Corliss Skains, MD;  Location: [facility] OR;  Service: Thoracic;  Laterality: N/A;   EYE SURGERY     bilateral cataracts; bilateral stents   GASTRIC RESECTION  2009   GLAUCOMA SURGERY     JOINT REPLACEMENT     right knee Dr. Lequita Halt 06-23-18   KNEE SURGERY Bilateral    NISSEN FUNDOPLICATION  2000   with subsequent takedown in 2009   TOTAL KNEE ARTHROPLASTY Left 06/10/2017   Procedure: LEFT  TOTAL KNEE ARTHROPLASTY;  Surgeon: Ollen Gross, MD;   Location: [facility] ORS;  Service: Orthopedics;  Laterality: Left;  Adductor Block   TOTAL KNEE ARTHROPLASTY Right 06/23/2018   Procedure: RIGHT TOTAL KNEE ARTHROPLASTY;  Surgeon: Ollen Gross, MD;  Location: [facility];  Service: Orthopedics;  Laterality: Right;   TRANSCAROTID ARTERY REVASCULARIZATION  Right 08/09/2023   Procedure: TRANSCAROTID ARTERY REVASCULARIZATION;  Surgeon: Leonie Douglas, MD;  Location: [facility] OR;  Service: Vascular;  Laterality: Right;   TRANSTHORACIC ECHOCARDIOGRAM  12/21/2010   EF 60%, moderate LVH,    TUBAL LIGATION     XI ROBOTIC ASSISTED HIATAL HERNIA REPAIR N/A 03/08/2021   Procedure: XI ROBOTIC ASSISTED LAPAROSCOPY WITH LYSIS OF ADHESIONS;  Surgeon: Corliss Skains, MD;  Location: [facility] OR;  Service: Thoracic;  Laterality: N/A;    Family History  Problem Relation Age of Onset   Heart disease Mother    Hypertension Mother    Diabetes Mother    Diabetes Father    Kidney disease Father    Stroke Brother    Cancer Maternal Grandmother    Kidney disease Maternal Grandfather    Breast cancer Daughter    Stomach cancer Maternal Aunt    Uterine cancer Maternal Aunt    Leukemia Maternal Uncle    Prostate cancer Maternal Uncle    Colon cancer Neg Hx    Rectal cancer Neg Hx    Esophageal cancer Neg Hx     Social History   Socioeconomic History   Marital status: Married    Spouse name: Not on file   Number of children: 8   Years of education: Not on file   Highest education level: Not on file  Occupational History   Occupation: retired    Comment: retired Clinical biochemist.   Tobacco Use   Smoking status: Never   Smokeless tobacco: Never  Vaping Use   Vaping status: Never Used  Substance and Sexual Activity   Alcohol use: No    Alcohol/week: 0.0 standard drinks of alcohol   Drug use: No   Sexual activity: Yes  Other Topics Concern   Not on file  Social History Narrative   Husband, [name] is Next of Kin. Cell # [phone]   Social Determinants of  Health   Financial Resource Strain: Not on file  Food Insecurity: No Food Insecurity (08/07/2023)   Hunger Vital Sign    Worried About Running Out of Food in the Last Year: Never true    Ran Out of Food in the Last Year: Never true  Transportation Needs: No Transportation Needs (08/07/2023)   PRAPARE - Administrator, Civil Service (Medical): No    Lack of Transportation (Non-Medical): No  Physical Activity: Not on file  Stress: Not on file  Social Connections: Not  on file  Intimate Partner Violence: Not At Risk (08/07/2023)   Humiliation, Afraid, Rape, and Kick questionnaire    Fear of Current or Ex-Partner: No    Emotionally Abused: No    Physically Abused: No    Sexually Abused: No    Allergies  Allergen Reactions   Adalat [Nifedipine] Other (See Comments)    Tired, Made pt "feel crazy in the head"   Hydrocodone Itching and Nausea And Vomiting   Meperidine Hcl Other (See Comments)    Demerol causes hallucinations   Naproxen Nausea And Vomiting    Current Outpatient Medications  Medication Sig Dispense Refill   acetaminophen (TYLENOL) 500 MG tablet Take 1,000 mg by mouth 2 (two) times daily as needed for headache (back and knee pain).     albuterol (VENTOLIN HFA) 108 (90 Base) MCG/ACT inhaler Inhale 1 puff into the lungs as needed for wheezing or shortness of breath.     ascorbic acid (VITAMIN C) 500 MG tablet Take 1 tablet (500 mg total) by mouth daily. 90 tablet 0   brimonidine (ALPHAGAN) 0.2 % ophthalmic solution Place 1 drop into both eyes 2 (two) times daily.     cetirizine (ZYRTEC) 10 MG tablet Take 10 mg by mouth at bedtime.     Cholecalciferol (VITAMIN D3) 250 MCG (10000 UT) capsule Take 10,000 Units by mouth daily.     clopidogrel (PLAVIX) 75 MG tablet Take 1 tablet (75 mg total) by mouth daily. 30 tablet 1   dexlansoprazole (DEXILANT) 60 MG capsule Take 1 capsule (60 mg total) by mouth daily. 90 capsule 3   ezetimibe (ZETIA) 10 MG tablet Take 1 tablet  (10 mg total) by mouth daily. 30 tablet 0   hydrocortisone (ANUSOL-HC) 25 MG suppository Place 1 suppository (25 mg total) rectally at bedtime. 5 suppository 1   ketoconazole (NIZORAL) 2 % cream Apply 1 Application topically daily. 60 g 2   latanoprost (XALATAN) 0.005 % ophthalmic solution Place 1 drop into the right eye at bedtime.     losartan (COZAAR) 50 MG tablet Take 1 tablet (50 mg total) by mouth in the morning and at bedtime. 180 tablet 3   metFORMIN (GLUCOPHAGE) 500 MG tablet Take 500 mg by mouth every morning.     montelukast (SINGULAIR) 10 MG tablet Take 10 mg by mouth at bedtime.     nystatin ointment (MYCOSTATIN) Apply 1 application  topically See admin instructions. 1 application 1-2 times a day as needed for rash  0   Polyethyl Glycol-Propyl Glycol (SYSTANE OP) Place 1 drop into both eyes daily as needed (dry/irritated eyes).     polyethylene glycol powder (GLYCOLAX/MIRALAX) 17 GM/SCOOP powder Take 17 g by mouth daily. 476 g 0   rosuvastatin (CRESTOR) 40 MG tablet Take 40 mg by mouth at bedtime.     senna-docusate (SENOKOT-S) 8.6-50 MG tablet Take 2 tablets by mouth 2 (two) times daily. 60 tablet 0   sertraline (ZOLOFT) 100 MG tablet Take 100 mg by mouth every morning.     sucralfate (CARAFATE) 1 g tablet Take 1 tablet (1 g total) by mouth 2 (two) times daily as needed. Do not take within 2 hours of any other medications. 60 tablet 3   traMADol (ULTRAM) 50 MG tablet Take 0.5 tablets (25 mg total) by mouth 3 (three) times daily as needed. 15 tablet 0   zinc sulfate 220 (50 Zn) MG capsule Take 1 capsule (220 mg total) by mouth daily. 90 capsule 0   No current facility-administered  medications for this visit.    PHYSICAL EXAM There were no vitals filed for this visit.  Elderly woman.  Appears younger than stated age. Palpable radial pulses bilaterally Mild left-sided weakness especially about the hand No cranial nerve abnormalities    PERTINENT LABORATORY AND RADIOLOGIC  DATA  Most recent CBC    Latest Ref Rng & Units 08/17/2023    9:58 PM 08/11/2023    3:29 AM 08/10/2023    5:45 AM  CBC  WBC 4.0 - 10.5 K/uL 6.4  6.1  7.3   Hemoglobin 12.0 - 15.0 g/dL 38.7  56.4  33.2   Hematocrit 36.0 - 46.0 % 38.6  34.0  34.9   Platelets 150 - 400 K/uL 201  149  167      Most recent CMP    Latest Ref Rng & Units 08/17/2023    9:58 PM 08/11/2023    3:29 AM 08/10/2023    5:45 AM  CMP  Glucose 70 - 99 mg/dL 951  884  166   BUN 8 - 23 mg/dL 16  13  13    Creatinine 0.44 - 1.00 mg/dL 0.63  0.16  0.10   Sodium 135 - 145 mmol/L 137  139  137   Potassium 3.5 - 5.1 mmol/L 4.2  3.9  4.0   Chloride 98 - 111 mmol/L 104  107  105   CO2 22 - 32 mmol/L 24  24  22    Calcium 8.9 - 10.3 mg/dL 9.6  8.7  8.7     Renal function CrCl cannot be calculated (Patient's most recent lab result is older than the maximum 21 days allowed.).  Hgb A1c MFr Bld (%)  Date Value  08/07/2023 6.8 (H)    LDL Chol Calc (NIH)  Date Value Ref Range Status  10/26/2020 115 (H) 0 - 99 mg/dL Final   LDL Cholesterol  Date Value Ref Range Status  08/10/2023 71 0 - 99 mg/dL Final    Comment:           Total Cholesterol/HDL:CHD Risk Coronary Heart Disease Risk Table                     Men   Women  1/2 Average Risk   3.4   3.3  Average Risk       5.0   4.4  2 X Average Risk   9.6   7.1  3 X Average Risk  23.4   11.0        Use the calculated Patient Ratio above and the CHD Risk Table to determine the patient's CHD Risk.        ATP III CLASSIFICATION (LDL):  <100     mg/dL   Optimal  932-355  mg/dL   Near or Above                    Optimal  130-159  mg/dL   Borderline  732-202  mg/dL   High  >542     mg/dL   Very High Performed at [facility], [address], [place], [place] [address]     Carotid duplex 09/06/2023  Summary:  Right Carotid: Patent stent with no evidence for restenosis.   Left Carotid: Velocities in the left ICA are consistent with a 1-39%  stenosis.    Vertebrals: Bilateral vertebral arteries demonstrate antegrade flow.  Subclavians: Normal flow hemodynamics were seen in bilateral subclavian  arteries.   Rande Brunt. Lenell Antu, MD [facility] Phone Number: [phone] 09/09/2023 12:13 PM   Total time spent on preparing this encounter including chart review, data review, collecting history, examining the patient, coordinating care for this established patient, 30 minutes.  Portions of this report may have been transcribed using voice recognition software.  Every effort has been made to ensure accuracy; however, inadvertent computerized transcription errors may still be present.

## 2023-09-10 ENCOUNTER — Ambulatory Visit (INDEPENDENT_AMBULATORY_CARE_PROVIDER_SITE_OTHER): Payer: Medicare Other | Admitting: Vascular Surgery

## 2023-09-10 ENCOUNTER — Encounter: Payer: Self-pay | Admitting: Vascular Surgery

## 2023-09-10 VITALS — BP 147/68 | HR 63 | Temp 98.2°F | Resp 20 | Ht 70.0 in | Wt 214.0 lb

## 2023-09-10 DIAGNOSIS — Z95828 Presence of other vascular implants and grafts: Secondary | ICD-10-CM

## 2023-09-11 DIAGNOSIS — R200 Anesthesia of skin: Secondary | ICD-10-CM | POA: Diagnosis not present

## 2023-09-11 DIAGNOSIS — Z48812 Encounter for surgical aftercare following surgery on the circulatory system: Secondary | ICD-10-CM | POA: Diagnosis not present

## 2023-09-11 DIAGNOSIS — Z95818 Presence of other cardiac implants and grafts: Secondary | ICD-10-CM | POA: Diagnosis not present

## 2023-09-11 DIAGNOSIS — I69398 Other sequelae of cerebral infarction: Secondary | ICD-10-CM | POA: Diagnosis not present

## 2023-09-11 DIAGNOSIS — S92352D Displaced fracture of fifth metatarsal bone, left foot, subsequent encounter for fracture with routine healing: Secondary | ICD-10-CM | POA: Diagnosis not present

## 2023-09-11 DIAGNOSIS — R29898 Other symptoms and signs involving the musculoskeletal system: Secondary | ICD-10-CM | POA: Diagnosis not present

## 2023-09-15 DIAGNOSIS — E785 Hyperlipidemia, unspecified: Secondary | ICD-10-CM | POA: Diagnosis not present

## 2023-09-15 DIAGNOSIS — Z7984 Long term (current) use of oral hypoglycemic drugs: Secondary | ICD-10-CM | POA: Diagnosis not present

## 2023-09-15 DIAGNOSIS — Z96659 Presence of unspecified artificial knee joint: Secondary | ICD-10-CM | POA: Diagnosis not present

## 2023-09-15 DIAGNOSIS — F32A Depression, unspecified: Secondary | ICD-10-CM | POA: Diagnosis not present

## 2023-09-15 DIAGNOSIS — I083 Combined rheumatic disorders of mitral, aortic and tricuspid valves: Secondary | ICD-10-CM | POA: Diagnosis not present

## 2023-09-15 DIAGNOSIS — Z7982 Long term (current) use of aspirin: Secondary | ICD-10-CM | POA: Diagnosis not present

## 2023-09-15 DIAGNOSIS — E119 Type 2 diabetes mellitus without complications: Secondary | ICD-10-CM | POA: Diagnosis not present

## 2023-09-15 DIAGNOSIS — F419 Anxiety disorder, unspecified: Secondary | ICD-10-CM | POA: Diagnosis not present

## 2023-09-15 DIAGNOSIS — I10 Essential (primary) hypertension: Secondary | ICD-10-CM | POA: Diagnosis not present

## 2023-09-15 DIAGNOSIS — Z9181 History of falling: Secondary | ICD-10-CM | POA: Diagnosis not present

## 2023-09-15 DIAGNOSIS — I69398 Other sequelae of cerebral infarction: Secondary | ICD-10-CM | POA: Diagnosis not present

## 2023-09-15 DIAGNOSIS — R200 Anesthesia of skin: Secondary | ICD-10-CM | POA: Diagnosis not present

## 2023-09-15 DIAGNOSIS — W19XXXD Unspecified fall, subsequent encounter: Secondary | ICD-10-CM | POA: Diagnosis not present

## 2023-09-15 DIAGNOSIS — S92352D Displaced fracture of fifth metatarsal bone, left foot, subsequent encounter for fracture with routine healing: Secondary | ICD-10-CM | POA: Diagnosis not present

## 2023-09-15 DIAGNOSIS — Z48812 Encounter for surgical aftercare following surgery on the circulatory system: Secondary | ICD-10-CM | POA: Diagnosis not present

## 2023-09-15 DIAGNOSIS — R29898 Other symptoms and signs involving the musculoskeletal system: Secondary | ICD-10-CM | POA: Diagnosis not present

## 2023-09-15 DIAGNOSIS — Z7902 Long term (current) use of antithrombotics/antiplatelets: Secondary | ICD-10-CM | POA: Diagnosis not present

## 2023-09-15 DIAGNOSIS — Z8719 Personal history of other diseases of the digestive system: Secondary | ICD-10-CM | POA: Diagnosis not present

## 2023-09-15 DIAGNOSIS — M9981 Other biomechanical lesions of cervical region: Secondary | ICD-10-CM | POA: Diagnosis not present

## 2023-09-15 DIAGNOSIS — I700 Atherosclerosis of aorta: Secondary | ICD-10-CM | POA: Diagnosis not present

## 2023-09-15 DIAGNOSIS — K219 Gastro-esophageal reflux disease without esophagitis: Secondary | ICD-10-CM | POA: Diagnosis not present

## 2023-09-15 DIAGNOSIS — Z95818 Presence of other cardiac implants and grafts: Secondary | ICD-10-CM | POA: Diagnosis not present

## 2023-09-16 ENCOUNTER — Encounter: Payer: Self-pay | Admitting: Cardiology

## 2023-09-16 ENCOUNTER — Other Ambulatory Visit: Payer: Self-pay | Admitting: Podiatry

## 2023-09-16 ENCOUNTER — Telehealth: Payer: Self-pay | Admitting: Podiatry

## 2023-09-16 ENCOUNTER — Ambulatory Visit: Payer: Medicare Other | Attending: Cardiology | Admitting: Cardiology

## 2023-09-16 VITALS — BP 118/63 | HR 67 | Ht 71.0 in | Wt 223.8 lb

## 2023-09-16 DIAGNOSIS — E782 Mixed hyperlipidemia: Secondary | ICD-10-CM

## 2023-09-16 DIAGNOSIS — Z8673 Personal history of transient ischemic attack (TIA), and cerebral infarction without residual deficits: Secondary | ICD-10-CM

## 2023-09-16 DIAGNOSIS — I10 Essential (primary) hypertension: Secondary | ICD-10-CM | POA: Diagnosis not present

## 2023-09-16 DIAGNOSIS — S92355A Nondisplaced fracture of fifth metatarsal bone, left foot, initial encounter for closed fracture: Secondary | ICD-10-CM

## 2023-09-16 NOTE — Telephone Encounter (Signed)
Hi pt husband is he needing a referral for a wheelchair. Need the referral faxed to [facility] store and the fax number is [phone]

## 2023-09-16 NOTE — Telephone Encounter (Deleted)
Hi pt husband is he needing a referral for a wheelchair. Need the referral faxed to [facility] medical supply store and the fax number is [phone]

## 2023-09-16 NOTE — Patient Instructions (Signed)
 Medication Instructions:  Your physician recommends that you continue on your current medications as directed. Please refer to the Current Medication list given to you today.  *If you need a refill on your cardiac medications before your next appointment, please call your pharmacy*   Follow-Up: At [facility], you and your health needs are our priority.  As part of our continuing mission to provide you with exceptional heart care, we have created designated Provider Care Teams.  These Care Teams include your primary Cardiologist (physician) and Advanced Practice Providers (APPs -  Physician Assistants and Nurse Practitioners) who all work together to provide you with the care you need, when you need it.   Your next appointment:   4 month(s)  Provider:   Thomasene Ripple, DO

## 2023-09-16 NOTE — Progress Notes (Signed)
Cardiology Office Note:    Date:  09/16/2023   ID:  [name], DOB Oct 25, 1940, MRN [id number]  PCP:  Harvest Forest, MD  Cardiologist:  Thomasene Ripple, DO  Electrophysiologist:  None   Referring MD: Harvest Forest, MD   " I am having shortness of breath"   History of Present Illness:    [name] is a 82 y.o. female with a hx of hypertension, occasional PVC , asthma, anxiety, GERD, history of CVA, diabetes mellitus, fibromyalgia here today for follow-up visit.  Her last visit we discussed her elevated blood pressure, I increase her losartan to 50 mg twice daily.  She had reported that she was told it Emergency Department that she was in atrial fibrillation, we interrogated her loop recorder we did not show any evidence of atrial fibrillation.  She has seen her PCP since her last visit with me and he added amlodipine 5 mg to her regimen.  Today she over no complaints.  Since last saw she has not been hospitalized.  She is doing well from a cardiovascular standpoint.  Past Medical History:  Diagnosis Date   Allergy    Anal or rectal pain    sometimes   Anemia    hx of during pregnancy   Anxiety    Arthritis    Asthma    hx of   Bradycardia    " I KNOW I HAVE BRADYCARDIA ESPECIALLY WHEN I SLEEP"    Cataract 2021   bilateral eyes   Chronic back pain    Degenerative joint disease    osteo   Depression    Diabetes mellitus without complication (HCC)    DM type II   Diverticulosis 2003   Dysrhythmia    hx of  due to eye drop and also low heart rate 40's per pt. Dr. Allyson Sabal follows   Elevated total protein    Esophageal dysmotility    Fibromyalgia    GERD (gastroesophageal reflux disease)    subsequent Nissen Fundoplication   Glaucoma    Hearing loss    Heart murmur    "was told she had a heart murmur"   Hemorrhoids    Hiatal hernia 11/08/2009   Hx of adenomatous colonic polyps 07/02/2002   Hypercalcemia    Hyperlipidemia    Hyperlipidemia    Hypertension     Implantable loop recorder present 11/28/2021   Nausea    Osteoporosis    PONV (postoperative nausea and vomiting)    Rectal bleeding    from hemorrhoids.     Sleep apnea    DOES USE CPAP    Thrombocytopenia (HCC)    Varicose veins of left lower extremity     Past Surgical History:  Procedure Laterality Date   CARDIAC CATHETERIZATION  03/14/1992   Normal cardiac cath. Normal LV function.   CARDIOVASCULAR STRESS TEST  01/22/2011   No scintigraphic evidence of inducible ischemia.   CAROTID DOPPLER  03/31/2007   Bilateral ICAs - no evidence of significant diameter reduction, dissectin, tortuosity, FMD, or any other vascular abnormality.   CATARACT EXTRACTION, BILATERAL     ESOPHAGEAL MANOMETRY N/A 11/14/2015   Procedure: ESOPHAGEAL MANOMETRY (EM);  Surgeon: Napoleon Form, MD;  Location: [facility];  Service: Endoscopy;  Laterality: N/A;   ESOPHAGEAL MANOMETRY N/A 01/18/2021   Procedure: ESOPHAGEAL MANOMETRY (EM);  Surgeon: Napoleon Form, MD;  Location: [facility];  Service: Endoscopy;  Laterality: N/A;   ESOPHAGOGASTRODUODENOSCOPY N/A 03/08/2021   Procedure: ESOPHAGOGASTRODUODENOSCOPY (EGD);  Surgeon: Corliss Skains, MD;  Location: [facility] OR;  Service: Thoracic;  Laterality: N/A;   EYE SURGERY     bilateral cataracts; bilateral stents   GASTRIC RESECTION  2009   GLAUCOMA SURGERY     JOINT REPLACEMENT     right knee Dr. Lequita Halt 06-23-18   KNEE SURGERY Bilateral    NISSEN FUNDOPLICATION  2000   with subsequent takedown in 2009   TOTAL KNEE ARTHROPLASTY Left 06/10/2017   Procedure: LEFT  TOTAL KNEE ARTHROPLASTY;  Surgeon: Ollen Gross, MD;  Location: [facility];  Service: Orthopedics;  Laterality: Left;  Adductor Block   TOTAL KNEE ARTHROPLASTY Right 06/23/2018   Procedure: RIGHT TOTAL KNEE ARTHROPLASTY;  Surgeon: Ollen Gross, MD;  Location: [facility];  Service: Orthopedics;  Laterality: Right;   TRANSCAROTID ARTERY REVASCULARIZATION  Right 08/09/2023   Procedure: TRANSCAROTID  ARTERY REVASCULARIZATION;  Surgeon: Leonie Douglas, MD;  Location: [facility] OR;  Service: Vascular;  Laterality: Right;   TRANSTHORACIC ECHOCARDIOGRAM  12/21/2010   EF 60%, moderate LVH,    TUBAL LIGATION     XI ROBOTIC ASSISTED HIATAL HERNIA REPAIR N/A 03/08/2021   Procedure: XI ROBOTIC ASSISTED LAPAROSCOPY WITH LYSIS OF ADHESIONS;  Surgeon: Corliss Skains, MD;  Location: [facility] OR;  Service: Thoracic;  Laterality: N/A;    Current Medications: Current Meds  Medication Sig   acetaminophen (TYLENOL) 500 MG tablet Take 1,000 mg by mouth 2 (two) times daily as needed for headache (back and knee pain).   albuterol (VENTOLIN HFA) 108 (90 Base) MCG/ACT inhaler Inhale 1 puff into the lungs as needed for wheezing or shortness of breath.   amLODipine (NORVASC) 5 MG tablet Take 5 mg by mouth daily.   ascorbic acid (VITAMIN C) 500 MG tablet Take 1 tablet (500 mg total) by mouth daily.   brimonidine (ALPHAGAN) 0.2 % ophthalmic solution Place 1 drop into both eyes 2 (two) times daily.   cetirizine (ZYRTEC) 10 MG tablet Take 10 mg by mouth at bedtime.   Cholecalciferol (VITAMIN D3) 250 MCG (10000 UT) capsule Take 10,000 Units by mouth daily.   clopidogrel (PLAVIX) 75 MG tablet Take 1 tablet (75 mg total) by mouth daily.   dexlansoprazole (DEXILANT) 60 MG capsule Take 1 capsule (60 mg total) by mouth daily.   ezetimibe (ZETIA) 10 MG tablet Take 1 tablet (10 mg total) by mouth daily.   hydrocortisone (ANUSOL-HC) 25 MG suppository Place 1 suppository (25 mg total) rectally at bedtime.   ketoconazole (NIZORAL) 2 % cream Apply 1 Application topically daily.   latanoprost (XALATAN) 0.005 % ophthalmic solution Place 1 drop into the right eye at bedtime.   losartan (COZAAR) 50 MG tablet Take 1 tablet (50 mg total) by mouth in the morning and at bedtime.   metFORMIN (GLUCOPHAGE) 500 MG tablet Take 500 mg by mouth every morning.   montelukast (SINGULAIR) 10 MG tablet Take 10 mg by mouth at bedtime.   nystatin ointment  (MYCOSTATIN) Apply 1 application  topically See admin instructions. 1 application 1-2 times a day as needed for rash   Polyethyl Glycol-Propyl Glycol (SYSTANE OP) Place 1 drop into both eyes daily as needed (dry/irritated eyes).   rosuvastatin (CRESTOR) 40 MG tablet Take 40 mg by mouth at bedtime.   senna-docusate (SENOKOT-S) 8.6-50 MG tablet Take 2 tablets by mouth 2 (two) times daily.   sertraline (ZOLOFT) 100 MG tablet Take 100 mg by mouth every morning.   sucralfate (CARAFATE) 1 g tablet Take 1 tablet (1 g total) by mouth  2 (two) times daily as needed. Do not take within 2 hours of any other medications.   traMADol (ULTRAM) 50 MG tablet Take 0.5 tablets (25 mg total) by mouth 3 (three) times daily as needed.   zinc sulfate 220 (50 Zn) MG capsule Take 1 capsule (220 mg total) by mouth daily.     Allergies:   Adalat [nifedipine], Hydrocodone, Meperidine hcl, and Naproxen   Social History   Socioeconomic History   Marital status: Married    Spouse name: Not on file   Number of children: 8   Years of education: Not on file   Highest education level: Not on file  Occupational History   Occupation: retired    Comment: retired Clinical biochemist.   Tobacco Use   Smoking status: Never   Smokeless tobacco: Never  Vaping Use   Vaping status: Never Used  Substance and Sexual Activity   Alcohol use: No    Alcohol/week: 0.0 standard drinks of alcohol   Drug use: No   Sexual activity: Yes  Other Topics Concern   Not on file  Social History Narrative   Husband, Myranda Osias is Next of Kin. Cell # [phone]   Social Determinants of Health   Financial Resource Strain: Not on file  Food Insecurity: No Food Insecurity (08/07/2023)   Hunger Vital Sign    Worried About Running Out of Food in the Last Year: Never true    Ran Out of Food in the Last Year: Never true  Transportation Needs: No Transportation Needs (08/07/2023)   PRAPARE - Administrator, Civil Service (Medical): No     Lack of Transportation (Non-Medical): No  Physical Activity: Not on file  Stress: Not on file  Social Connections: Not on file     Family History: The patient's family history includes Breast cancer in her daughter; Cancer in her maternal grandmother; Diabetes in her father and mother; Heart disease in her mother; Hypertension in her mother; Kidney disease in her father and maternal grandfather; Leukemia in her maternal uncle; Prostate cancer in her maternal uncle; Stomach cancer in her maternal aunt; Stroke in her brother; Uterine cancer in her maternal aunt. There is no history of Colon cancer, Rectal cancer, or Esophageal cancer.  ROS:   Review of Systems  Constitution: Negative for decreased appetite, fever and weight gain.  HENT: Negative for congestion, ear discharge, hoarse voice and sore throat.   Eyes: Negative for discharge, redness, vision loss in right eye and visual halos.  Cardiovascular: Reports dyspnea exertion.  Negative for chest pain, leg swelling, orthopnea and palpitations.  Respiratory: Negative for cough, hemoptysis, shortness of breath and snoring.   Endocrine: Negative for heat intolerance and polyphagia.  Hematologic/Lymphatic: Negative for bleeding problem. Does not bruise/bleed easily.  Skin: Negative for flushing, nail changes, rash and suspicious lesions.  Musculoskeletal: Negative for arthritis, joint pain, muscle cramps, myalgias, neck pain and stiffness.  Gastrointestinal: Negative for abdominal pain, bowel incontinence, diarrhea and excessive appetite.  Genitourinary: Negative for decreased libido, genital sores and incomplete emptying.  Neurological: Negative for brief paralysis, focal weakness, headaches and loss of balance.  Psychiatric/Behavioral: Negative for altered mental status, depression and suicidal ideas.  Allergic/Immunologic: Negative for HIV exposure and persistent infections.    EKGs/Labs/Other Studies Reviewed:    The following  studies were reviewed today:   EKG:  The ekg ordered today demonstrates sinus rhythm, heart rate 60 bpm  CTA of the chest December 2023  IMPRESSION: 1. No evidence of  pulmonary embolism. 2. Suspect mild pulmonary interstitial edema. 3. Borderline enlarged main pulmonary artery suggesting pulmonary hypertension. Additionally, there is cardiomegaly with reflux of contrast into the IVC/hepatic veins suggesting right heart dysfunction. 4. Unchanged hiatal hernia.     Electronically Signed   By: Lesia Hausen M.D.   On: 10/04/2022 14:03    Coronary CTA February 2023 FINDINGS: Non-cardiac: See separate report from [facility]. No significant findings on limited lung and soft tissue windows.   Calcium Score: No calcium noted in coronary arteries   Coronary Arteries: Right dominant with no anomalies   LM: Normal   LAD: Normal   D1: Normal   D2: Normal   Circumflex: Normal   OM1: Normal   OM2: Normal   RCA: Normal   PDA: Normal   PLA: Normal   IMPRESSION: 1. Calcium score 0   2.  Normal right dominant coronary arteries   3.  Normal ascending thoracic aorta 3.6 cm   Charlton Haws   Electronically Signed: By: Charlton Haws M.D. On: 12/04/2021 12:58    Echocardiogram February 2023 IMPRESSIONS   1. Left ventricular ejection fraction, by estimation, is 60 to 65%. The  left ventricle has normal function. The left ventricle has no regional  wall motion abnormalities. There is mild concentric left ventricular  hypertrophy. Left ventricular diastolic  parameters are consistent with Grade I diastolic dysfunction (impaired  relaxation).   2. Right ventricular systolic function is normal. The right ventricular  size is normal.   3. Left atrial size was mild to moderately dilated.   4. The mitral valve is normal in structure. Trivial mitral valve  regurgitation. No evidence of mitral stenosis.   5. The aortic valve is tricuspid. Aortic valve  regurgitation is not  visualized. No aortic stenosis is present.   6. Aortic dilatation noted. There is borderline dilatation of the  ascending aorta, measuring 36 mm.   7. The inferior vena cava is normal in size with greater than 50%  respiratory variability, suggesting right atrial pressure of 3 mmHg.   Recent Labs: 10/11/2022: NT-Pro BNP 181 10/22/2022: TSH 0.303 08/06/2023: ALT 20 08/11/2023: B Natriuretic Peptide 202.0; Magnesium 2.0 08/17/2023: BUN 16; Creatinine, Ser 1.07; Hemoglobin 12.7; Platelets 201; Potassium 4.2; Sodium 137  Recent Lipid Panel    Component Value Date/Time   CHOL 135 08/10/2023 0545   CHOL 200 (H) 10/26/2020 0942   TRIG 30 08/10/2023 0545   HDL 58 08/10/2023 0545   HDL 76 10/26/2020 0942   CHOLHDL 2.3 08/10/2023 0545   VLDL 6 08/10/2023 0545   LDLCALC 71 08/10/2023 0545   LDLCALC 115 (H) 10/26/2020 0942    Physical Exam:    VS:  BP 118/63 (BP Location: Right Arm, Patient Position: Sitting, Cuff Size: Normal)   Pulse 67   Ht 5\' 11"  (1.803 m)   Wt 223 lb 12.8 oz (101.5 kg) Comment: PATIENT WEARING A BOOT  SpO2 94%   BMI 31.21 kg/m     Wt Readings from Last 3 Encounters:  09/16/23 223 lb 12.8 oz (101.5 kg)  09/10/23 214 lb (97.1 kg)  08/20/23 214 lb (97.1 kg)     GEN: Well nourished, well developed in no acute distress HEENT: Normal NECK: Noted JVD; No carotid bruits, there is also suspicion of a small likely fat pad/cyst on palpation of the right neck LYMPHATICS: No lymphadenopathy CARDIAC: S1S2 noted,RRR, no murmurs, rubs, gallops RESPIRATORY:  Clear to auscultation without rales, wheezing or rhonchi  ABDOMEN: Soft, non-tender, non-distended, +  bowel sounds, no guarding. EXTREMITIES: +2 bilateral lower extremity edema edema, No cyanosis, no clubbing MUSCULOSKELETAL:  No deformity  SKIN: Warm and dry NEUROLOGIC:  Alert and oriented x 3, non-focal PSYCHIATRIC:  Normal affect, good insight  ASSESSMENT:    1. Essential hypertension   2.  History of CVA (cerebrovascular accident)   3. Mixed hyperlipidemia        PLAN:    She looks great today and I am happy. Blood pressure is at target.  Continue current antihypertensive medications losartan 50 mg twice a day, she has been started on amlodipine 5 by her PCP will continue this as well.  The patient understands the need to lose weight with diet and exercise. We have discussed specific strategies for this.   The patient is in agreement with the above plan. The patient left the office in stable condition.  The patient will follow up in 4 months or sooner if needed.   Medication Adjustments/Labs and Tests Ordered: Current medicines are reviewed at length with the patient today.  Concerns regarding medicines are outlined above.  No orders of the defined types were placed in this encounter.  No orders of the defined types were placed in this encounter.   Patient Instructions  Medication Instructions:  Your physician recommends that you continue on your current medications as directed. Please refer to the Current Medication list given to you today.  *If you need a refill on your cardiac medications before your next appointment, please call your pharmacy*    Follow-Up: At [facility], you and your health needs are our priority.  As part of our continuing mission to provide you with exceptional heart care, we have created designated Provider Care Teams.  These Care Teams include your primary Cardiologist (physician) and Advanced Practice Providers (APPs -  Physician Assistants and Nurse Practitioners) who all work together to provide you with the care you need, when you need it.  Your next appointment:   4 month(s)  Provider:   Thomasene Ripple, DO     Adopting a Healthy Lifestyle.  Know what a healthy weight is for you (roughly BMI <25) and aim to maintain this   Aim for 7+ servings of fruits and vegetables daily   65-80+ fluid ounces of water or unsweet  tea for healthy kidneys   Limit to max 1 drink of alcohol per day; avoid smoking/tobacco   Limit animal fats in diet for cholesterol and heart health - choose grass fed whenever available   Avoid highly processed foods, and foods high in saturated/trans fats   Aim for low stress - take time to unwind and care for your mental health   Aim for 150 min of moderate intensity exercise weekly for heart health, and weights twice weekly for bone health   Aim for 7-9 hours of sleep daily   When it comes to diets, agreement about the perfect plan isnt easy to find, even among the experts. Experts at the [facility] [employer] developed an idea known as the Healthy Eating Plate. Just imagine a plate divided into logical, healthy portions.   The emphasis is on diet quality:   Load up on vegetables and fruits - one-half of your plate: Aim for color and variety, and remember that potatoes dont count.   Go for whole grains - one-quarter of your plate: Whole wheat, barley, wheat berries, quinoa, oats, brown rice, and foods made with them. If you want pasta, go with whole wheat pasta.  Protein power - one-quarter of your plate: Fish, chicken, beans, and nuts are all healthy, versatile protein sources. Limit red meat.   The diet, however, does go beyond the plate, offering a few other suggestions.   Use healthy plant oils, such as olive, canola, soy, corn, sunflower and peanut. Check the labels, and avoid partially hydrogenated oil, which have unhealthy trans fats.   If youre thirsty, drink water. Coffee and tea are good in moderation, but skip sugary drinks and limit milk and dairy products to one or two daily servings.   The type of carbohydrate in the diet is more important than the amount. Some sources of carbohydrates, such as vegetables, fruits, whole grains, and beans-are healthier than others.   Finally, stay active  Signed, Thomasene Ripple, DO  09/16/2023 10:02 AM    [facility]

## 2023-09-17 ENCOUNTER — Other Ambulatory Visit: Payer: Self-pay

## 2023-09-17 DIAGNOSIS — I6521 Occlusion and stenosis of right carotid artery: Secondary | ICD-10-CM

## 2023-09-17 DIAGNOSIS — I10 Essential (primary) hypertension: Secondary | ICD-10-CM | POA: Diagnosis not present

## 2023-09-17 DIAGNOSIS — Z0000 Encounter for general adult medical examination without abnormal findings: Secondary | ICD-10-CM | POA: Diagnosis not present

## 2023-09-18 ENCOUNTER — Telehealth: Payer: Self-pay

## 2023-09-18 DIAGNOSIS — Z0000 Encounter for general adult medical examination without abnormal findings: Secondary | ICD-10-CM | POA: Diagnosis not present

## 2023-09-18 DIAGNOSIS — Z95818 Presence of other cardiac implants and grafts: Secondary | ICD-10-CM | POA: Diagnosis not present

## 2023-09-18 DIAGNOSIS — I10 Essential (primary) hypertension: Secondary | ICD-10-CM | POA: Diagnosis not present

## 2023-09-18 DIAGNOSIS — R29898 Other symptoms and signs involving the musculoskeletal system: Secondary | ICD-10-CM | POA: Diagnosis not present

## 2023-09-18 DIAGNOSIS — R200 Anesthesia of skin: Secondary | ICD-10-CM | POA: Diagnosis not present

## 2023-09-18 DIAGNOSIS — Z48812 Encounter for surgical aftercare following surgery on the circulatory system: Secondary | ICD-10-CM | POA: Diagnosis not present

## 2023-09-18 DIAGNOSIS — I69398 Other sequelae of cerebral infarction: Secondary | ICD-10-CM | POA: Diagnosis not present

## 2023-09-18 DIAGNOSIS — S92352D Displaced fracture of fifth metatarsal bone, left foot, subsequent encounter for fracture with routine healing: Secondary | ICD-10-CM | POA: Diagnosis not present

## 2023-09-18 NOTE — Telephone Encounter (Signed)
Order and demographics faxed to [facility] @ [phone] Confirmation received

## 2023-09-19 ENCOUNTER — Ambulatory Visit: Payer: Medicare Other

## 2023-09-19 DIAGNOSIS — I739 Peripheral vascular disease, unspecified: Secondary | ICD-10-CM

## 2023-09-19 DIAGNOSIS — M2141 Flat foot [pes planus] (acquired), right foot: Secondary | ICD-10-CM

## 2023-09-19 DIAGNOSIS — E1142 Type 2 diabetes mellitus with diabetic polyneuropathy: Secondary | ICD-10-CM

## 2023-09-19 NOTE — Progress Notes (Signed)
Patient presents to the office today for diabetic shoe and insole measuring.  Patient was measured with brannock device to determine size and width for 1 pair of extra depth shoes and foam casted for 3 pair of insoles.   Documentation of medical necessity will be sent to patient's treating diabetic doctor to verify and sign.   Patient's diabetic provider:  Harvest Forest, MD   Shoes and insoles will be ordered at that time and patient will be notified for an appointment for fitting when they arrive.   Shoe size (per patient): 11 Brannock measurement: 11 Patient shoe selection- Shoe choice:   A330W / A300W Shoe size ordered: 11WD [employer]ancials signed

## 2023-09-25 ENCOUNTER — Encounter: Payer: Self-pay | Admitting: Podiatry

## 2023-09-25 ENCOUNTER — Ambulatory Visit (INDEPENDENT_AMBULATORY_CARE_PROVIDER_SITE_OTHER): Payer: Medicare Other

## 2023-09-25 ENCOUNTER — Ambulatory Visit (INDEPENDENT_AMBULATORY_CARE_PROVIDER_SITE_OTHER): Payer: Medicare Other | Admitting: Podiatry

## 2023-09-25 DIAGNOSIS — S92355A Nondisplaced fracture of fifth metatarsal bone, left foot, initial encounter for closed fracture: Secondary | ICD-10-CM | POA: Diagnosis not present

## 2023-09-25 NOTE — Progress Notes (Signed)
Subjective:  Patient ID: [name], female    DOB: 29-Sep-1941,   MRN: [id number]  No chief complaint on file.   82 y.o. female presents for follow-up of fifth metatarsal fracture. Relates she is doing ok. Still getting pain.  Relates burning and tingling in their feet. Patient is diabetic and last A1c was  Lab Results  Component Value Date   HGBA1C 6.8 (H) 08/07/2023   .   PCP:  Harvest Forest, MD     PCP:  Harvest Forest, MD    . Denies any other pedal complaints. Denies n/v/f/c.   Past Medical History:  Diagnosis Date   Allergy    Anal or rectal pain    sometimes   Anemia    hx of during pregnancy   Anxiety    Arthritis    Asthma    hx of   Bradycardia    " I KNOW I HAVE BRADYCARDIA ESPECIALLY WHEN I SLEEP"    Cataract 2021   bilateral eyes   Chronic back pain    Degenerative joint disease    osteo   Depression    Diabetes mellitus without complication (HCC)    DM type II   Diverticulosis 2003   Dysrhythmia    hx of  due to eye drop and also low heart rate 40's per pt. Dr. Allyson Sabal follows   Elevated total protein    Esophageal dysmotility    Fibromyalgia    GERD (gastroesophageal reflux disease)    subsequent Nissen Fundoplication   Glaucoma    Hearing loss    Heart murmur    "was told she had a heart murmur"   Hemorrhoids    Hiatal hernia 11/08/2009   Hx of adenomatous colonic polyps 07/02/2002   Hypercalcemia    Hyperlipidemia    Hyperlipidemia    Hypertension    Implantable loop recorder present 11/28/2021   Nausea    Osteoporosis    PONV (postoperative nausea and vomiting)    Rectal bleeding    from hemorrhoids.     Sleep apnea    DOES USE CPAP    Thrombocytopenia (HCC)    Varicose veins of left lower extremity     Objective:  Physical Exam: Vascular: DP/PT pulses 1/4 bilateral. CFT <3 seconds. Normal hair growth on digits. No edema.  Skin. No lacerations or abrasions bilateral feet. Nails 1-5 bilateral are thickened elongated  and with subungual debris. Scaling and erythema noted to mostly left plantar feet.  Musculoskeletal: MMT 5/5 bilateral lower extremities in DF, PF, Inversion and Eversion. Deceased ROM in DF of ankle joint. Collapse of medial arch bilateral with eversion of calcaneus. Tender to the latearl aspect of the foot around the fifth metatarsal diaphysis and proximally along the peroneal tendon insertion. No pain with ROM of the fifth digit. Most pain over proximal process of fifth metatarsal.  Neurological: Sensation intact to light touch.   Assessment:   1. Closed nondisplaced fracture of fifth metatarsal bone of left foot, initial encounter       Plan:  Patient was evaluated and treated and all questions answered. -Mechanically debrided all nails 1-5 bilateral using sterile nail nipper and filed with dremel without incident  -Answered all patient questions -Xrays reviewed. Acute fracture noted to proximal fifth metatarsal noted through lateral half of bone just distal to tarsometatarsal joint. Some gapping noted but mild healing callus formation noted. Improving.  -Discussed treatement options for fifth metatarsal fracture; risks, alternatives, and benefits explained. -Discussed  this type of fracture is typically surgical corrected but discussed given history of PVD would be risky to try and correct and issues with healing. Discussed conservative treatments for now.  -Continue CAM boot.  -Recommend protection, rest, ice, elevation daily until symptoms improve -Tylenol as needed.    -Patient to return to office in 4 weeks for serial x-rays to assess healing  or sooner if condition worsens.   Louann Sjogren, DPM

## 2023-09-27 ENCOUNTER — Telehealth: Payer: Self-pay

## 2023-09-27 DIAGNOSIS — S92355D Nondisplaced fracture of fifth metatarsal bone, left foot, subsequent encounter for fracture with routine healing: Secondary | ICD-10-CM | POA: Diagnosis not present

## 2023-09-27 DIAGNOSIS — E7800 Pure hypercholesterolemia, unspecified: Secondary | ICD-10-CM | POA: Diagnosis not present

## 2023-09-27 DIAGNOSIS — R519 Headache, unspecified: Secondary | ICD-10-CM | POA: Diagnosis not present

## 2023-09-27 DIAGNOSIS — Z9862 Peripheral vascular angioplasty status: Secondary | ICD-10-CM | POA: Diagnosis not present

## 2023-09-27 DIAGNOSIS — G4733 Obstructive sleep apnea (adult) (pediatric): Secondary | ICD-10-CM | POA: Diagnosis not present

## 2023-09-27 DIAGNOSIS — K219 Gastro-esophageal reflux disease without esophagitis: Secondary | ICD-10-CM | POA: Diagnosis not present

## 2023-09-27 DIAGNOSIS — I10 Essential (primary) hypertension: Secondary | ICD-10-CM | POA: Diagnosis not present

## 2023-09-27 DIAGNOSIS — B372 Candidiasis of skin and nail: Secondary | ICD-10-CM | POA: Diagnosis not present

## 2023-09-27 DIAGNOSIS — Z23 Encounter for immunization: Secondary | ICD-10-CM | POA: Diagnosis not present

## 2023-09-27 DIAGNOSIS — E1165 Type 2 diabetes mellitus with hyperglycemia: Secondary | ICD-10-CM | POA: Diagnosis not present

## 2023-09-27 DIAGNOSIS — F321 Major depressive disorder, single episode, moderate: Secondary | ICD-10-CM | POA: Diagnosis not present

## 2023-09-27 DIAGNOSIS — R55 Syncope and collapse: Secondary | ICD-10-CM | POA: Diagnosis not present

## 2023-09-27 NOTE — Telephone Encounter (Signed)
Call back received from Pt.  Per Pt she does not think she was asleep yesterday during time of pause.  She denies syncope, dizziness or any noticeable symptoms during time of event.  Advised Pt would forward to Dr. Elberta Fortis for review and call back if any recommendations.

## 2023-09-27 NOTE — Telephone Encounter (Addendum)
Attempted to contact Pt.  Left message requesting call back.  Outreach made to daughter Nabilah per [employer].  Spoke with Caera and advised of alert.  She will call Pt and have her call device clinic.  Alert received from [employer]:  ILR alert for pause Event occurred 12/12 @ 13:45, duration 6sec per device EGM c/w heart block  Loop implanted for history of syncope.

## 2023-10-01 DIAGNOSIS — H401133 Primary open-angle glaucoma, bilateral, severe stage: Secondary | ICD-10-CM | POA: Diagnosis not present

## 2023-10-01 DIAGNOSIS — E113213 Type 2 diabetes mellitus with mild nonproliferative diabetic retinopathy with macular edema, bilateral: Secondary | ICD-10-CM | POA: Diagnosis not present

## 2023-10-01 DIAGNOSIS — H43393 Other vitreous opacities, bilateral: Secondary | ICD-10-CM | POA: Diagnosis not present

## 2023-10-01 DIAGNOSIS — H43823 Vitreomacular adhesion, bilateral: Secondary | ICD-10-CM | POA: Diagnosis not present

## 2023-10-01 NOTE — Telephone Encounter (Signed)
Pt would like a call back from [place]

## 2023-10-01 NOTE — Telephone Encounter (Signed)
Called patient who was wanting to know if Dr. Elberta Fortis had responded yet. Advised pt Dr. Elberta Fortis has not been able to get back to the message yet. Once we hear back, someone will follow up with pt. Voiced understanding.

## 2023-10-03 DIAGNOSIS — I69398 Other sequelae of cerebral infarction: Secondary | ICD-10-CM | POA: Diagnosis not present

## 2023-10-03 DIAGNOSIS — R200 Anesthesia of skin: Secondary | ICD-10-CM | POA: Diagnosis not present

## 2023-10-03 DIAGNOSIS — R29898 Other symptoms and signs involving the musculoskeletal system: Secondary | ICD-10-CM | POA: Diagnosis not present

## 2023-10-03 DIAGNOSIS — Z48812 Encounter for surgical aftercare following surgery on the circulatory system: Secondary | ICD-10-CM | POA: Diagnosis not present

## 2023-10-03 DIAGNOSIS — S92352D Displaced fracture of fifth metatarsal bone, left foot, subsequent encounter for fracture with routine healing: Secondary | ICD-10-CM | POA: Diagnosis not present

## 2023-10-03 DIAGNOSIS — Z95818 Presence of other cardiac implants and grafts: Secondary | ICD-10-CM | POA: Diagnosis not present

## 2023-10-03 NOTE — Telephone Encounter (Signed)
Pt called to advise Dr. Elberta Fortis recommendations and advised someone will call from scheduling for apt. Pt voiced understanding.

## 2023-10-04 NOTE — Progress Notes (Signed)
[facility] Summary Report / Loop Recorder 

## 2023-10-07 NOTE — Progress Notes (Unsigned)
  Electrophysiology Office Note:   Date:  10/08/2023  ID:  [name], DOB [birth year], MRN [id number]  Primary Cardiologist: Thomasene Ripple, DO Primary Heart Failure: None Electrophysiologist: Cheryl Jorja Loa, MD      History of Present Illness:   [name] is a 82 y.o. female with h/o bradycardia, syncope, HTN, HLD, CVA, OSA, CKD 3a, GERD, DM, & fibromyalgia seen today for routine electrophysiology followup.   MDT ILR in place due to syncope. On 09/26/23 the patient had a 6 second pause around 1345 in the afternoon. She was asymptomatic with the event.   Since last being seen in our clinic the patient reports she has a non-healing Jones fracture in her foot that she has been dealing with, remains in a boot. Continues to have days where she does not feel well, has lightheadedness.  No further episodes of syncope.   She denies chest pain, palpitations, dyspnea, PND, orthopnea, nausea, vomiting, dizziness, syncope, edema, weight gain, or early satiety.   Review of systems complete and found to be negative unless listed in HPI.   EP Information / Studies Reviewed:    EKG is ordered today. Personal review as below.  EKG Interpretation Date/Time:  Tuesday October 08 2023 08:09:43 EST Ventricular Rate:  61 PR Interval:  166 QRS Duration:  92 QT Interval:  412 QTC Calculation: 414 R Axis:   34  Text Interpretation: Normal sinus rhythm Normal ECG Confirmed by Canary Brim ([id number]) on 10/08/2023 8:51:50 AM   Studies:  Cardiac CT 11/2021 > calcium score 0 ETT 05/2022 > negative for ischemia, normal chronotropic respnose ECHO 07/2023 > LVEF 65-70%, G1DD, LA mod dilated, RA mildly dilated   Arrhythmia / AAD Bradycardia > s/p ILR   Device MDT ILR 12/06/22          Physical Exam:   VS:  BP 126/60   Pulse 61   Ht 5\' 10"  (1.778 m)   Wt 223 lb 3.2 oz (101.2 kg)   SpO2 97%   BMI 32.03 kg/m    Wt Readings from Last 3 Encounters:  10/08/23 223 lb 3.2 oz (101.2 kg)  09/16/23  223 lb 12.8 oz (101.5 kg)  09/10/23 214 lb (97.1 kg)     GEN: Well nourished, well developed in no acute distress NECK: No JVD; No carotid bruits CARDIAC: Regular rate and rhythm, no murmurs, rubs, gallops RESPIRATORY:  Clear to auscultation without rales, wheezing or rhonchi  ABDOMEN: Soft, non-tender, non-distended EXTREMITIES:  No edema; No deformity   ASSESSMENT AND PLAN:    Bradycardia Episode of suspected Vasovagal / CHB during daytime sleeping Hx Syncope  OSA  -pt reports she does nap during day without her sleep mask in place -no further known episodes  -EKG with SB 59 bpm -reviewed episode with Dr. Elberta Fortis  -no further pause episodes by MDT CareLink review  -given event occurred possibly during sleep and no further episodes, Cheryl continue watchful waiting for now -medications reviewed, no AV nodal blocking agents > avoid with baseline bradycardia  -reviewed indications for PPM with patient and husband   Follow up with Dr. Elberta Fortis or EP APP  in 3 months  Signed, Canary Brim, MSN, APRN, NP-C, AGACNP-BC [facility] - Electrophysiology  10/08/2023, 12:24 PM

## 2023-10-08 ENCOUNTER — Ambulatory Visit: Payer: Medicare Other | Attending: Pulmonary Disease | Admitting: Pulmonary Disease

## 2023-10-08 ENCOUNTER — Encounter: Payer: Self-pay | Admitting: Pulmonary Disease

## 2023-10-08 VITALS — BP 126/60 | HR 61 | Ht 70.0 in | Wt 223.2 lb

## 2023-10-08 DIAGNOSIS — R001 Bradycardia, unspecified: Secondary | ICD-10-CM | POA: Insufficient documentation

## 2023-10-08 DIAGNOSIS — R55 Syncope and collapse: Secondary | ICD-10-CM | POA: Insufficient documentation

## 2023-10-08 NOTE — Patient Instructions (Signed)
Medication Instructions:  Your physician recommends that you continue on your current medications as directed. Please refer to the Current Medication list given to you today.  *If you need a refill on your cardiac medications before your next appointment, please call your pharmacy*  Lab Work: None ordered If you have labs (blood work) drawn today and your tests are completely normal, you will receive your results only by: MyChart Message (if you have MyChart) OR A paper copy in the mail If you have any lab test that is abnormal or we need to change your treatment, we will call you to review the results.  Follow-Up: At [facility], you and your health needs are our priority.  As part of our continuing mission to provide you with exceptional heart care, we have created designated Provider Care Teams.  These Care Teams include your primary Cardiologist (physician) and Advanced Practice Providers (APPs -  Physician Assistants and Nurse Practitioners) who all work together to provide you with the care you need, when you need it.  Your next appointment:   3 month(s)  Provider:   Canary Brim, NP

## 2023-10-10 DIAGNOSIS — Z48812 Encounter for surgical aftercare following surgery on the circulatory system: Secondary | ICD-10-CM | POA: Diagnosis not present

## 2023-10-10 DIAGNOSIS — R200 Anesthesia of skin: Secondary | ICD-10-CM | POA: Diagnosis not present

## 2023-10-10 DIAGNOSIS — I69398 Other sequelae of cerebral infarction: Secondary | ICD-10-CM | POA: Diagnosis not present

## 2023-10-10 DIAGNOSIS — Z95818 Presence of other cardiac implants and grafts: Secondary | ICD-10-CM | POA: Diagnosis not present

## 2023-10-10 DIAGNOSIS — R29898 Other symptoms and signs involving the musculoskeletal system: Secondary | ICD-10-CM | POA: Diagnosis not present

## 2023-10-10 DIAGNOSIS — S92352D Displaced fracture of fifth metatarsal bone, left foot, subsequent encounter for fracture with routine healing: Secondary | ICD-10-CM | POA: Diagnosis not present

## 2023-10-14 ENCOUNTER — Ambulatory Visit: Payer: Medicare Other

## 2023-10-14 DIAGNOSIS — H04123 Dry eye syndrome of bilateral lacrimal glands: Secondary | ICD-10-CM | POA: Diagnosis not present

## 2023-10-14 DIAGNOSIS — H401132 Primary open-angle glaucoma, bilateral, moderate stage: Secondary | ICD-10-CM | POA: Diagnosis not present

## 2023-10-14 DIAGNOSIS — R55 Syncope and collapse: Secondary | ICD-10-CM | POA: Diagnosis not present

## 2023-10-15 DIAGNOSIS — I083 Combined rheumatic disorders of mitral, aortic and tricuspid valves: Secondary | ICD-10-CM | POA: Diagnosis not present

## 2023-10-15 DIAGNOSIS — Z8719 Personal history of other diseases of the digestive system: Secondary | ICD-10-CM | POA: Diagnosis not present

## 2023-10-15 DIAGNOSIS — E785 Hyperlipidemia, unspecified: Secondary | ICD-10-CM | POA: Diagnosis not present

## 2023-10-15 DIAGNOSIS — Z7902 Long term (current) use of antithrombotics/antiplatelets: Secondary | ICD-10-CM | POA: Diagnosis not present

## 2023-10-15 DIAGNOSIS — Z96659 Presence of unspecified artificial knee joint: Secondary | ICD-10-CM | POA: Diagnosis not present

## 2023-10-15 DIAGNOSIS — K219 Gastro-esophageal reflux disease without esophagitis: Secondary | ICD-10-CM | POA: Diagnosis not present

## 2023-10-15 DIAGNOSIS — R200 Anesthesia of skin: Secondary | ICD-10-CM | POA: Diagnosis not present

## 2023-10-15 DIAGNOSIS — I700 Atherosclerosis of aorta: Secondary | ICD-10-CM | POA: Diagnosis not present

## 2023-10-15 DIAGNOSIS — Z95818 Presence of other cardiac implants and grafts: Secondary | ICD-10-CM | POA: Diagnosis not present

## 2023-10-15 DIAGNOSIS — F419 Anxiety disorder, unspecified: Secondary | ICD-10-CM | POA: Diagnosis not present

## 2023-10-15 DIAGNOSIS — S92352D Displaced fracture of fifth metatarsal bone, left foot, subsequent encounter for fracture with routine healing: Secondary | ICD-10-CM | POA: Diagnosis not present

## 2023-10-15 DIAGNOSIS — Z9181 History of falling: Secondary | ICD-10-CM | POA: Diagnosis not present

## 2023-10-15 DIAGNOSIS — Z7984 Long term (current) use of oral hypoglycemic drugs: Secondary | ICD-10-CM | POA: Diagnosis not present

## 2023-10-15 DIAGNOSIS — Z7982 Long term (current) use of aspirin: Secondary | ICD-10-CM | POA: Diagnosis not present

## 2023-10-15 DIAGNOSIS — I10 Essential (primary) hypertension: Secondary | ICD-10-CM | POA: Diagnosis not present

## 2023-10-15 DIAGNOSIS — E119 Type 2 diabetes mellitus without complications: Secondary | ICD-10-CM | POA: Diagnosis not present

## 2023-10-15 DIAGNOSIS — R29898 Other symptoms and signs involving the musculoskeletal system: Secondary | ICD-10-CM | POA: Diagnosis not present

## 2023-10-15 DIAGNOSIS — I69398 Other sequelae of cerebral infarction: Secondary | ICD-10-CM | POA: Diagnosis not present

## 2023-10-15 DIAGNOSIS — M9981 Other biomechanical lesions of cervical region: Secondary | ICD-10-CM | POA: Diagnosis not present

## 2023-10-15 DIAGNOSIS — F32A Depression, unspecified: Secondary | ICD-10-CM | POA: Diagnosis not present

## 2023-10-15 LAB — CUP PACEART REMOTE DEVICE CHECK
Date Time Interrogation Session: 20241229231538
Implantable Pulse Generator Implant Date: 20240222

## 2023-10-17 ENCOUNTER — Encounter: Payer: Self-pay | Admitting: Cardiology

## 2023-10-17 ENCOUNTER — Other Ambulatory Visit: Payer: Self-pay

## 2023-10-17 DIAGNOSIS — S92352D Displaced fracture of fifth metatarsal bone, left foot, subsequent encounter for fracture with routine healing: Secondary | ICD-10-CM | POA: Diagnosis not present

## 2023-10-17 DIAGNOSIS — R29898 Other symptoms and signs involving the musculoskeletal system: Secondary | ICD-10-CM | POA: Diagnosis not present

## 2023-10-17 DIAGNOSIS — I69398 Other sequelae of cerebral infarction: Secondary | ICD-10-CM | POA: Diagnosis not present

## 2023-10-17 DIAGNOSIS — I700 Atherosclerosis of aorta: Secondary | ICD-10-CM | POA: Diagnosis not present

## 2023-10-17 DIAGNOSIS — R001 Bradycardia, unspecified: Secondary | ICD-10-CM

## 2023-10-17 DIAGNOSIS — I10 Essential (primary) hypertension: Secondary | ICD-10-CM | POA: Diagnosis not present

## 2023-10-17 DIAGNOSIS — R200 Anesthesia of skin: Secondary | ICD-10-CM | POA: Diagnosis not present

## 2023-10-17 NOTE — Telephone Encounter (Signed)
 Referral made

## 2023-10-17 NOTE — Telephone Encounter (Signed)
 Multiple encounters regarding this matter. Please see additional mychart encounter for more details. Closing this encounter.

## 2023-10-17 NOTE — Progress Notes (Signed)
Referral for EP placed.

## 2023-10-23 ENCOUNTER — Encounter: Payer: Self-pay | Admitting: Podiatry

## 2023-10-23 ENCOUNTER — Ambulatory Visit (INDEPENDENT_AMBULATORY_CARE_PROVIDER_SITE_OTHER): Payer: Medicare Other | Admitting: Podiatry

## 2023-10-23 ENCOUNTER — Telehealth: Payer: Self-pay | Admitting: Cardiology

## 2023-10-23 DIAGNOSIS — M2142 Flat foot [pes planus] (acquired), left foot: Secondary | ICD-10-CM

## 2023-10-23 DIAGNOSIS — M2141 Flat foot [pes planus] (acquired), right foot: Secondary | ICD-10-CM | POA: Diagnosis not present

## 2023-10-23 DIAGNOSIS — S92355K Nondisplaced fracture of fifth metatarsal bone, left foot, subsequent encounter for fracture with nonunion: Secondary | ICD-10-CM

## 2023-10-23 DIAGNOSIS — E1142 Type 2 diabetes mellitus with diabetic polyneuropathy: Secondary | ICD-10-CM | POA: Diagnosis not present

## 2023-10-23 DIAGNOSIS — I739 Peripheral vascular disease, unspecified: Secondary | ICD-10-CM

## 2023-10-23 MED ORDER — OXYCODONE-ACETAMINOPHEN 5-325 MG PO TABS: 1.0000 | ORAL_TABLET | ORAL | 0 refills | Status: AC | PRN

## 2023-10-23 NOTE — Telephone Encounter (Signed)
  Returned call to discussed with pts daughter.   PCP concern was raised 12/13 when initial alert was received.   Pt was seen 12/24 and discussed pause had potential vagal component and pt was asymptomatic, thought she may have been sleeping.   Review of Carelink reveals no further pauses as of this am.   Discussed that previous discussion regarding pacemaker back in 2023 were related to intermittent HRs in to 40s at rest, which improved with activity, and was not an indication for pacing.   Daughter feels reassured that Dr. Deno has reviewed the pause as per note 12/24 and plans to continue watchful waiting.   Will cancel appointment for 1/9 and keep appointment for March.  Can move up if she has any significant pauses or symptomatic bradycardia. Continue to monitor loop recorder.    Ozell Jodie Passey, PA-C  10/23/2023 10:19 AM

## 2023-10-23 NOTE — Progress Notes (Signed)
 Patient presents today for routine Diabetic foot care with Dr. Joya also to pick up diabetic shoes and insoles.  Patient was dispensed 1 pair of diabetic shoes and 3 pairs of foam casted diabetic insoles. Fit was satisfactory. Instructions for break-in and wear was reviewed and a copy was given to the patient.   Re-appointment for regularly scheduled diabetic foot care visits or if they should experience any trouble with the shoes or insoles.  Lolita Schultze CPed, CFo, CFm

## 2023-10-23 NOTE — Telephone Encounter (Signed)
 Daughter calling with concern from the patient's pcp had discuss with them. Please advise

## 2023-10-23 NOTE — Progress Notes (Signed)
 Subjective:  Patient ID: [name], female    DOB: 1941/08/18,   MRN: [id number]  No chief complaint on file.   83 y.o. female presents for follow-up of fifth metatarsal fracture. Relates she is doing ok but the pain has been getting worse.   Relates burning and tingling in their feet. Patient is diabetic and last A1c was  Lab Results  Component Value Date   HGBA1C 6.8 (H) 08/07/2023   .   PCP:  Roanna Ezekiel [name], MD     PCP:  Roanna Ezekiel [name], MD    . Denies any other pedal complaints. Denies n/v/f/c.   Past Medical History:  Diagnosis Date   Allergy    Anal or rectal pain    sometimes   Anemia    hx of during pregnancy   Anxiety    Arthritis    Asthma    hx of   Bradycardia     I KNOW I HAVE BRADYCARDIA ESPECIALLY WHEN I SLEEP    Cataract 2021   bilateral eyes   Chronic back pain    Degenerative joint disease    osteo   Depression    Diabetes mellitus without complication (HCC)    DM type II   Diverticulosis 2003   Dysrhythmia    hx of  due to eye drop and also low heart rate 40's per pt. Dr. Court follows   Elevated total protein    Esophageal dysmotility    Fibromyalgia    GERD (gastroesophageal reflux disease)    subsequent Nissen Fundoplication   Glaucoma    Hearing loss    Heart murmur    was told she had a heart murmur   Hemorrhoids    Hiatal hernia 11/08/2009   Hx of adenomatous colonic polyps 07/02/2002   Hypercalcemia    Hyperlipidemia    Hyperlipidemia    Hypertension    Implantable loop recorder present 11/28/2021   Nausea    Osteoporosis    PONV (postoperative nausea and vomiting)    Rectal bleeding    from hemorrhoids.     Sleep apnea    DOES USE CPAP    Thrombocytopenia (HCC)    Varicose veins of left lower extremity     Objective:  Physical Exam: Vascular: DP/PT pulses 1/4 bilateral. CFT <3 seconds. Normal hair growth on digits. No edema.  Skin. No lacerations or abrasions bilateral feet. Nails 1-5 bilateral are  thickened elongated and with subungual debris. Scaling and erythema noted to mostly left plantar feet.  Musculoskeletal: MMT 5/5 bilateral lower extremities in DF, PF, Inversion and Eversion. Deceased ROM in DF of ankle joint. Collapse of medial arch bilateral with eversion of calcaneus. Tender to the latearl aspect of the foot around the fifth metatarsal diaphysis and proximally along the peroneal tendon insertion. No pain with ROM of the fifth digit. Most pain over proximal process of fifth metatarsal.  Neurological: Sensation intact to light touch.   Assessment:   1. 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 -Answered all patient questions -Xrays reviewed. Acute fracture noted to proximal fifth metatarsal noted through lateral half of bone just distal to tarsometatarsal joint. Some gapping noted but not healing noted from previous.  -Discussed treatement options for fifth metatarsal fracture; risks, alternatives, and benefits explained. -Discussed this type of fracture is typically surgical corrected but discussed given  history of PVD would be risky to try and correct and issues with healing. Discussed conservative treatments for now.  -Continue CAM boot.  -Recommend protection, rest, ice, elevation daily until symptoms improve -Will work on getting bone stimulator as no healing noted and not a surgical candidate will see if this will improve healing time.  -Oxycodon 5/325 sent to pharmacy for increased pain from fracture.  -Patient to return to office in 4 weeks for serial x-rays to assess healing  or sooner if condition worsens.   Asberry Failing, DPM

## 2023-10-24 ENCOUNTER — Ambulatory Visit: Payer: Medicare Other | Admitting: Student

## 2023-10-25 DIAGNOSIS — I700 Atherosclerosis of aorta: Secondary | ICD-10-CM | POA: Diagnosis not present

## 2023-10-25 DIAGNOSIS — I69398 Other sequelae of cerebral infarction: Secondary | ICD-10-CM | POA: Diagnosis not present

## 2023-10-25 DIAGNOSIS — R200 Anesthesia of skin: Secondary | ICD-10-CM | POA: Diagnosis not present

## 2023-10-25 DIAGNOSIS — I10 Essential (primary) hypertension: Secondary | ICD-10-CM | POA: Diagnosis not present

## 2023-10-25 DIAGNOSIS — R29898 Other symptoms and signs involving the musculoskeletal system: Secondary | ICD-10-CM | POA: Diagnosis not present

## 2023-10-25 DIAGNOSIS — S92352D Displaced fracture of fifth metatarsal bone, left foot, subsequent encounter for fracture with routine healing: Secondary | ICD-10-CM | POA: Diagnosis not present

## 2023-10-30 ENCOUNTER — Ambulatory Visit (INDEPENDENT_AMBULATORY_CARE_PROVIDER_SITE_OTHER): Payer: Medicare Other | Admitting: Podiatry

## 2023-10-30 ENCOUNTER — Encounter: Payer: Self-pay | Admitting: Podiatry

## 2023-10-30 DIAGNOSIS — S92355K Nondisplaced fracture of fifth metatarsal bone, left foot, subsequent encounter for fracture with nonunion: Secondary | ICD-10-CM

## 2023-10-30 MED ORDER — TRAMADOL HCL 50 MG PO TABS: 25.0000 mg | ORAL_TABLET | Freq: Three times a day (TID) | ORAL | 0 refills | Status: AC | PRN

## 2023-10-30 NOTE — Progress Notes (Signed)
 Subjective:  Patient ID: [name], female    DOB: [birth year],   MRN: [id number]  No chief complaint on file.   83 y.o. female presents for follow-up of fifth metatarsal fracture. Relates she is doing ok but the pain has been getting worse.  Relates the oxycodone  caused her to itch so stopped taking.  Relates burning and tingling in their feet. Patient is diabetic and last A1c was  Lab Results  Component Value Date   HGBA1C 6.8 (H) 08/07/2023   .   PCP:  Nohemi Batters, MD     PCP:  Nohemi Batters, MD    . Denies any other pedal complaints. Denies n/v/f/c.   Past Medical History:  Diagnosis Date   Allergy    Anal or rectal pain    sometimes   Anemia    hx of during pregnancy   Anxiety    Arthritis    Asthma    hx of   Bradycardia    " I KNOW I HAVE BRADYCARDIA ESPECIALLY WHEN I SLEEP"    Cataract 2021   bilateral eyes   Chronic back pain    Degenerative joint disease    osteo   Depression    Diabetes mellitus without complication (HCC)    DM type II   Diverticulosis 2003   Dysrhythmia    hx of  due to eye drop and also low heart rate 40's per pt. Dr. Katheryne Pane follows   Elevated total protein    Esophageal dysmotility    Fibromyalgia    GERD (gastroesophageal reflux disease)    subsequent Nissen Fundoplication   Glaucoma    Hearing loss    Heart murmur    "was told she had a heart murmur"   Hemorrhoids    Hiatal hernia 11/08/2009   Hx of adenomatous colonic polyps 07/02/2002   Hypercalcemia    Hyperlipidemia    Hyperlipidemia    Hypertension    Implantable loop recorder present 11/28/2021   Nausea    Osteoporosis    PONV (postoperative nausea and vomiting)    Rectal bleeding    from hemorrhoids.     Sleep apnea    DOES USE CPAP    Thrombocytopenia (HCC)    Varicose veins of left lower extremity     Objective:  Physical Exam: Vascular: DP/PT pulses 1/4 bilateral. CFT <3 seconds. Normal hair growth on digits. No edema.  Skin. No  lacerations or abrasions bilateral feet. Nails 1-5 bilateral are thickened elongated and with subungual debris. Scaling and erythema noted to mostly left plantar feet.  Musculoskeletal: MMT 5/5 bilateral lower extremities in DF, PF, Inversion and Eversion. Deceased ROM in DF of ankle joint. Collapse of medial arch bilateral with eversion of calcaneus. Tender to the latearl aspect of the foot around the fifth metatarsal diaphysis and proximally along the peroneal tendon insertion. No pain with ROM of the fifth digit. Most pain over proximal process of fifth metatarsal.  Neurological: Sensation intact to light touch.   Assessment:   1. 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 -Answered all patient questions -Xrays reviewed. Acute fracture noted to proximal fifth metatarsal noted through lateral half of bone just distal to tarsometatarsal joint. Some gapping noted. No signs of healing from previous. There is a <1mm fracture gap.  Non union present.  -Discussed treatement options for  fifth metatarsal fracture non union; risks, alternatives, and benefits explained. -Discussed this type of fracture is typically surgical corrected but discussed given history of PVD would be risky to try and correct and issues with healing. Discussed conservative treatments for now.  -Continue CAM boot.  -Recommend protection, rest, ice, elevation daily until symptoms improve -Will work on getting bone stimulator as no healing noted and not a surgical candidate will see if this will with non union. Bone stimulator ordered.  -Tramadol  sent to pharmacy as oxycodone  made her itch.  -Patient to return to office in 4 weeks for serial x-rays to assess healing  or sooner if condition worsens.   Jennefer Moats, DPM

## 2023-11-06 ENCOUNTER — Ambulatory Visit (INDEPENDENT_AMBULATORY_CARE_PROVIDER_SITE_OTHER): Payer: Medicare Other | Admitting: Neurology

## 2023-11-06 ENCOUNTER — Encounter: Payer: Self-pay | Admitting: Neurology

## 2023-11-06 VITALS — BP 125/72 | HR 63 | Ht 70.0 in | Wt 219.8 lb

## 2023-11-06 DIAGNOSIS — R519 Headache, unspecified: Secondary | ICD-10-CM | POA: Diagnosis not present

## 2023-11-06 DIAGNOSIS — I6521 Occlusion and stenosis of right carotid artery: Secondary | ICD-10-CM

## 2023-11-06 DIAGNOSIS — R29898 Other symptoms and signs involving the musculoskeletal system: Secondary | ICD-10-CM

## 2023-11-06 MED ORDER — INDOMETHACIN 50 MG PO CAPS
50.0000 mg | ORAL_CAPSULE | Freq: Three times a day (TID) | ORAL | 0 refills | Status: DC
Start: 2023-11-06 — End: 2023-12-04

## 2023-11-06 NOTE — Patient Instructions (Signed)
I had a long d/w patient about  her recent stroke, symptomatic carotid stenosis and carotid revascularization procedure, new temporal headaches, risk for recurrent stroke/TIAs, personally independently reviewed imaging studies and stroke evaluation results and answered questions.Continue Plavix 75 mg daily for secondary stroke prevention and maintain strict control of hypertension with blood pressure goal below 130/90, diabetes with hemoglobin A1c goal below 6.5% and lipids with LDL cholesterol goal below 70 mg/dL. I also advised the patient to eat a healthy diet with plenty of whole grains, cereals, fruits and vegetables, exercise regularly and maintain ideal body weight.  Check ESR, C-reactive protein and temporal artery ultrasound for temporal arteritis.  Trial of indomethacin 50 mg twice daily for a week to be increased to 3 times daily to help with her temporal shooting headaches.  Follow-up with Dr. Lenell Antu for pain at the carotid TCAR procedure surgical site followup in the future with me in 6 months or call earlier if necessary.  Stroke Prevention Some medical conditions and behaviors can lead to a higher chance of having a stroke. You can help prevent a stroke by eating healthy, exercising, not smoking, and managing any medical conditions you have. Stroke is a leading cause of functional impairment. Primary prevention is particularly important because a majority of strokes are first-time events. Stroke changes the lives of not only those who experience a stroke but also their family and other caregivers. How can this condition affect me? A stroke is a medical emergency and should be treated right away. A stroke can lead to brain damage and can sometimes be life-threatening. If a person gets medical treatment right away, there is a better chance of surviving and recovering from a stroke. What can increase my risk? The following medical conditions may increase your risk of a stroke: Cardiovascular  disease. High blood pressure (hypertension). Diabetes. High cholesterol. Sickle cell disease. Blood clotting disorders (hypercoagulable state). Obesity. Sleep disorders (obstructive sleep apnea). Other risk factors include: Being older than age 74. Having a history of blood clots, stroke, or mini-stroke (transient ischemic attack, TIA). Genetic factors, such as race, ethnicity, or a family history of stroke. Smoking cigarettes or using other tobacco products. Taking birth control pills, especially if you also use tobacco. Heavy use of alcohol or drugs, especially cocaine and methamphetamine. Physical inactivity. What actions can I take to prevent this? Manage your health conditions High cholesterol levels. Eating a healthy diet is important for preventing high cholesterol. If cholesterol cannot be managed through diet alone, you may need to take medicines. Take any prescribed medicines to control your cholesterol as told by your health care provider. Hypertension. To reduce your risk of stroke, try to keep your blood pressure below 130/80. Eating a healthy diet and exercising regularly are important for controlling blood pressure. If these steps are not enough to manage your blood pressure, you may need to take medicines. Take any prescribed medicines to control hypertension as told by your health care provider. Ask your health care provider if you should monitor your blood pressure at home. Have your blood pressure checked every year, even if your blood pressure is normal. Blood pressure increases with age and some medical conditions. Diabetes. Eating a healthy diet and exercising regularly are important parts of managing your blood sugar (glucose). If your blood sugar cannot be managed through diet and exercise, you may need to take medicines. Take any prescribed medicines to control your diabetes as told by your health care provider. Get evaluated for obstructive sleep apnea. Talk  to  your health care provider about getting a sleep evaluation if you snore a lot or have excessive sleepiness. Make sure that any other medical conditions you have, such as atrial fibrillation or atherosclerosis, are managed. Nutrition Follow instructions from your health care provider about what to eat or drink to help manage your health condition. These instructions may include: Reducing your daily calorie intake. Limiting how much salt (sodium) you use to 1,500 milligrams (mg) each day. Using only healthy fats for cooking, such as olive oil, canola oil, or sunflower oil. Eating healthy foods. You can do this by: Choosing foods that are high in fiber, such as whole grains, and fresh fruits and vegetables. Eating at least 5 servings of fruits and vegetables a day. Try to fill one-half of your plate with fruits and vegetables at each meal. Choosing lean protein foods, such as lean cuts of meat, poultry without skin, fish, tofu, beans, and nuts. Eating low-fat dairy products. Avoiding foods that are high in sodium. This can help lower blood pressure. Avoiding foods that have saturated fat, trans fat, and cholesterol. This can help prevent high cholesterol. Avoiding processed and prepared foods. Counting your daily carbohydrate intake.  Lifestyle If you drink alcohol: Limit how much you have to: 0-1 drink a day for women who are not pregnant. 0-2 drinks a day for men. Know how much alcohol is in your drink. In the [place] one drink equals one 12 oz bottle of beer ( ), one 5 oz glass of wine ( ), or one 1 oz glass of hard liquor (44mL). Do not use any products that contain nicotine or tobacco. These products include cigarettes, chewing tobacco, and vaping devices, such as e-cigarettes. If you need help quitting, ask your health care provider. Avoid secondhand smoke. Do not use drugs. Activity  Try to stay at a healthy weight. Get at least 30 minutes of exercise on most days, such  as: Fast walking. Biking. Swimming. Medicines Take over-the-counter and prescription medicines only as told by your health care provider. Aspirin or blood thinners (antiplatelets or anticoagulants) may be recommended to reduce your risk of forming blood clots that can lead to stroke. Avoid taking birth control pills. Talk to your health care provider about the risks of taking birth control pills if: You are over 37 years old. You smoke. You get very bad headaches. You have had a blood clot. Where to find more information American Stroke Association: www.strokeassociation.org Get help right away if: You or a loved one has any symptoms of a stroke. "BE FAST" is an easy way to remember the main warning signs of a stroke: B - Balance. Signs are dizziness, sudden trouble walking, or loss of balance. E - Eyes. Signs are trouble seeing or a sudden change in vision. F - Face. Signs are sudden weakness or numbness of the face, or the face or eyelid drooping on one side. A - Arms. Signs are weakness or numbness in an arm. This happens suddenly and usually on one side of the body. S - Speech. Signs are sudden trouble speaking, slurred speech, or trouble understanding what people say. T - Time. Time to call emergency services. Write down what time symptoms started. You or a loved one has other signs of a stroke, such as: A sudden, severe headache with no known cause. Nausea or vomiting. Seizure. These symptoms may represent a serious problem that is an emergency. Do not wait to see if the symptoms will go away. Get medical help right  away. Call your local emergency services (911 in the [place] Do not drive yourself to the hospital. Summary You can help to prevent a stroke by eating healthy, exercising, not smoking, limiting alcohol intake, and managing any medical conditions you may have. Do not use any products that contain nicotine or tobacco. These include cigarettes, chewing tobacco, and vaping  devices, such as e-cigarettes. If you need help quitting, ask your health care provider. Remember "BE FAST" for warning signs of a stroke. Get help right away if you or a loved one has any of these signs. This information is not intended to replace advice given to you by your health care provider. Make sure you discuss any questions you have with your health care provider. Document Revised: 09/03/2022 Document Reviewed: 09/03/2022 Elsevier Patient Education  2024 [employer].

## 2023-11-06 NOTE — Progress Notes (Unsigned)
[facility] [address] [place]. [place] [address] [phone]       OFFICE FOLLOW-UP NOTE  Ms. BLAKLEIGH WAYCHOFF Date of Birth:  1941/07/16 Medical Record Number:  [id number]   HPI:  ROS:   14 system review of systems is positive for ***  PMH:  Past Medical History:  Diagnosis Date   Allergy    Anal or rectal pain    sometimes   Anemia    hx of during pregnancy   Anxiety    Arthritis    Asthma    hx of   Bradycardia    " I KNOW I HAVE BRADYCARDIA ESPECIALLY WHEN I SLEEP"    Cataract 2021   bilateral eyes   Chronic back pain    Degenerative joint disease    osteo   Depression    Diabetes mellitus without complication (HCC)    DM type II   Diverticulosis 2003   Dysrhythmia    hx of  due to eye drop and also low heart rate 40's per pt. Dr. Allyson Sabal follows   Elevated total protein    Esophageal dysmotility    Fibromyalgia    GERD (gastroesophageal reflux disease)    subsequent Nissen Fundoplication   Glaucoma    Hearing loss    Heart murmur    "was told she had a heart murmur"   Hemorrhoids    Hiatal hernia 11/08/2009   Hx of adenomatous colonic polyps 07/02/2002   Hypercalcemia    Hyperlipidemia    Hyperlipidemia    Hypertension    Implantable loop recorder present 11/28/2021   Nausea    Osteoporosis    PONV (postoperative nausea and vomiting)    Rectal bleeding    from hemorrhoids.     Sleep apnea    DOES USE CPAP    Thrombocytopenia (HCC)    Varicose veins of left lower extremity     Social History:  Social History   Socioeconomic History   Marital status: Married    Spouse name: Not on file   Number of children: 8   Years of education: Not on file   Highest education level: Not on file  Occupational History   Occupation: retired    Comment: retired Clinical biochemist.   Tobacco Use   Smoking status: Never   Smokeless tobacco: Never  Vaping Use   Vaping status: Never Used  Substance and Sexual Activity   Alcohol use:  No    Alcohol/week: 0.0 standard drinks of alcohol   Drug use: No   Sexual activity: Yes  Other Topics Concern   Not on file  Social History Narrative   Husband, Natilie Diehr is Next of Kin. Cell # [phone]      Right handed    Wear glasses drinks    Social Drivers of Health   Financial Resource Strain: Not on file  Food Insecurity: No Food Insecurity (08/07/2023)   Hunger Vital Sign    Worried About Running Out of Food in the Last Year: Never true    Ran Out of Food in the Last Year: Never true  Transportation Needs: No Transportation Needs (08/07/2023)   PRAPARE - Administrator, Civil Service (Medical): No    Lack of Transportation (Non-Medical): No  Physical Activity: Not on file  Stress: Not on file  Social Connections: Not on file  Intimate Partner Violence: Not At Risk (08/07/2023)   Humiliation, Afraid, Rape, and Kick questionnaire    Fear of  Current or Ex-Partner: No    Emotionally Abused: No    Physically Abused: No    Sexually Abused: No    Medications:   Current Outpatient Medications on File Prior to Visit  Medication Sig Dispense Refill   acetaminophen (TYLENOL) 500 MG tablet Take 1,000 mg by mouth 2 (two) times daily as needed for headache (back and knee pain).     albuterol (VENTOLIN HFA) 108 (90 Base) MCG/ACT inhaler Inhale 1 puff into the lungs as needed for wheezing or shortness of breath.     amLODipine (NORVASC) 10 MG tablet Take 10 mg by mouth daily. Per note patient taking for 90 days.     ascorbic acid (VITAMIN C) 500 MG tablet Take 1 tablet (500 mg total) by mouth daily. 90 tablet 0   brimonidine (ALPHAGAN) 0.2 % ophthalmic solution Place 1 drop into both eyes 2 (two) times daily.     cetirizine (ZYRTEC) 10 MG tablet Take 10 mg by mouth at bedtime.     Cholecalciferol (VITAMIN D3) 250 MCG (10000 UT) capsule Take 10,000 Units by mouth daily.     clopidogrel (PLAVIX) 75 MG tablet Take 1 tablet (75 mg total) by mouth daily. 30 tablet 1    dexlansoprazole (DEXILANT) 60 MG capsule Take 1 capsule (60 mg total) by mouth daily. 90 capsule 3   ezetimibe (ZETIA) 10 MG tablet Take 1 tablet (10 mg total) by mouth daily. 30 tablet 0   hydrocortisone (ANUSOL-HC) 25 MG suppository Place 1 suppository (25 mg total) rectally at bedtime. 5 suppository 1   ketoconazole (NIZORAL) 2 % cream Apply 1 Application topically daily. 60 g 2   latanoprost (XALATAN) 0.005 % ophthalmic solution Place 1 drop into the right eye at bedtime.     metFORMIN (GLUCOPHAGE) 500 MG tablet Take 500 mg by mouth every morning.     mirtazapine (REMERON) 7.5 MG tablet Take 7.5 mg by mouth at bedtime.     montelukast (SINGULAIR) 10 MG tablet Take 10 mg by mouth at bedtime.     nystatin ointment (MYCOSTATIN) Apply 1 application  topically See admin instructions. 1 application 1-2 times a day as needed for rash  0   Polyethyl Glycol-Propyl Glycol (SYSTANE OP) Place 1 drop into both eyes daily as needed (dry/irritated eyes).     rosuvastatin (CRESTOR) 40 MG tablet Take 40 mg by mouth at bedtime.     senna-docusate (SENOKOT-S) 8.6-50 MG tablet Take 2 tablets by mouth 2 (two) times daily. 60 tablet 0   sertraline (ZOLOFT) 100 MG tablet Take 100 mg by mouth every morning.     sucralfate (CARAFATE) 1 g tablet Take 1 tablet (1 g total) by mouth 2 (two) times daily as needed. Do not take within 2 hours of any other medications. 60 tablet 3   telmisartan (MICARDIS) 80 MG tablet Take 80 mg by mouth every morning.     Zinc 50 MG TABS Take 220 mg by mouth daily.     zinc sulfate 220 (50 Zn) MG capsule Take 1 capsule (220 mg total) by mouth daily. 90 capsule 0   polyethylene glycol powder (GLYCOLAX/MIRALAX) 17 GM/SCOOP powder Take 17 g by mouth daily. 476 g 0   No current facility-administered medications on file prior to visit.    Allergies:   Allergies  Allergen Reactions   Adalat [Nifedipine] Other (See Comments)    Tired, Made pt "feel crazy in the head"   Hydrocodone Itching  and Nausea And Vomiting   Oxycodone-Acetaminophen Itching  Meperidine Hcl Other (See Comments)    Demerol causes hallucinations   Naproxen Nausea And Vomiting    Physical Exam General: well developed, well nourished pleasant elderly African-American lady, seated, in no evident distress Head: head normocephalic and atraumatic.  Neck: supple with no carotid or supraclavicular bruits Cardiovascular: regular rate and rhythm, no murmurs Musculoskeletal: no deformity except wearing left ankle boot.   Skin:  no rash/petichiae Vascular:  Normal pulses all extremities.No superficial temporal artery tenderness. Vitals:   11/06/23 1446  BP: 125/72  Pulse: 63   Neurologic Exam Mental Status: Awake and fully alert. Oriented to place and time. Recent and remote memory intact. Attention span, concentration and fund of knowledge appropriate. Mood and affect appropriate.  Cranial Nerves: Fundoscopic exam not done. Pupils equal, briskly reactive to light. Extraocular movements full without nystagmus. Visual fields full to confrontation. Hearing intact. Facial sensation intact.  Mild left nasolabial fold asymmetry., tongue, palate moves normally and symmetrically.  Motor: Normal bulk and tone. Normal strength in all tested extremity muscles.  Except mild left grip weakness.  Diminished fine finger movements on the left.  Orbits right over left upper extremity.  Mild left foot weakness due to pain. Sensory.: intact to touch ,pinprick .position and vibratory sensation.  Coordination: Rapid alternating movements normal in all extremities. Finger-to-nose and heel-to-shin performed accurately bilaterally. Gait and Station: Arises from chair without difficulty. Stance is normal.  Uses a wheeled walker.  Favors left foot due to pain and wearing a boot.   Reflexes: 1+ and symmetric. Toes downgoing.   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  Vascular risk factors of diabetes, hypertension, hyperlipidemia mild obesity.    PLAN:I had a long d/w patient about  her recent stroke, symptomatic carotid stenosis and carotid revascularization procedure, new temporal headaches, risk for recurrent stroke/TIAs, personally independently reviewed imaging studies and stroke evaluation results and answered questions.Continue Plavix 75 mg daily for secondary stroke prevention and maintain strict control of hypertension with blood pressure goal below 130/90, diabetes with hemoglobin A1c goal below 6.5% and lipids with LDL cholesterol goal below 70 mg/dL. I also advised the patient to eat a healthy diet with plenty of whole grains, cereals, fruits and vegetables, exercise regularly and maintain ideal body weight.  Check ESR, C-reactive protein and temporal artery ultrasound for temporal arteritis.  Trial of indomethacin 50 mg twice daily for a week to be increased to 3 times daily to help with her temporal shooting headaches.  Follow-up with Dr. Lenell Antu for pain at the carotid TCAR procedure surgical site followup in the future with me in 6 months or call earlier if necessary.  Greater than 50% of time during this 35 minute visit was spent on counseling,explanation of diagnosis carotid stenosis and stroke and temporal headaches, planning of further management, discussion with patient and family and coordination of care Delia Heady, MD Note: This document was prepared with digital dictation and possible smart phrase technology. Any transcriptional errors that result from this process are unintentional

## 2023-11-07 LAB — SEDIMENTATION RATE: Sed Rate: 13 mm/h (ref 0–40)

## 2023-11-07 LAB — C-REACTIVE PROTEIN: CRP: <1 mg/L (ref 0–10)

## 2023-11-08 ENCOUNTER — Other Ambulatory Visit: Payer: Self-pay

## 2023-11-08 ENCOUNTER — Ambulatory Visit (INDEPENDENT_AMBULATORY_CARE_PROVIDER_SITE_OTHER): Payer: Medicare Other

## 2023-11-08 DIAGNOSIS — S92355K Nondisplaced fracture of fifth metatarsal bone, left foot, subsequent encounter for fracture with nonunion: Secondary | ICD-10-CM | POA: Diagnosis not present

## 2023-11-08 NOTE — Addendum Note (Signed)
Addended by: Daryel November on: 11/08/2023 01:06 PM   Modules accepted: Orders

## 2023-11-13 NOTE — Progress Notes (Signed)
Kindly inform the patient that lab work for inflammatory markers in the blood was normal nothing to worry about

## 2023-11-14 ENCOUNTER — Ambulatory Visit (HOSPITAL_COMMUNITY)
Admission: RE | Admit: 2023-11-14 | Discharge: 2023-11-14 | Disposition: A | Discharge status: Home / Self Care | Payer: Medicare Other | Source: Ambulatory Visit | Attending: Neurology | Admitting: Neurology

## 2023-11-14 DIAGNOSIS — R519 Headache, unspecified: Secondary | ICD-10-CM | POA: Insufficient documentation

## 2023-11-14 NOTE — Progress Notes (Signed)
VASCULAR LAB    Temporal artery duplex has been performed.  See CV proc for preliminary results.   [name], RVT 11/14/2023, 10:50 AM

## 2023-11-18 ENCOUNTER — Ambulatory Visit (INDEPENDENT_AMBULATORY_CARE_PROVIDER_SITE_OTHER): Payer: Medicare Other

## 2023-11-18 DIAGNOSIS — R55 Syncope and collapse: Secondary | ICD-10-CM

## 2023-11-18 LAB — CUP PACEART REMOTE DEVICE CHECK
Date Time Interrogation Session: 20250202231849
Implantable Pulse Generator Implant Date: 20240222

## 2023-11-20 ENCOUNTER — Ambulatory Visit (INDEPENDENT_AMBULATORY_CARE_PROVIDER_SITE_OTHER): Payer: Medicare Other | Admitting: Podiatry

## 2023-11-20 DIAGNOSIS — S92355K Nondisplaced fracture of fifth metatarsal bone, left foot, subsequent encounter for fracture with nonunion: Secondary | ICD-10-CM

## 2023-11-20 NOTE — Progress Notes (Signed)
 Subjective:  Patient ID: [name], female    DOB: 1941/07/08,   MRN: [id number]  No chief complaint on file.   83 y.o. female presents for follow-up of fifth metatarsal fracture. Relates she is doing ok and pain better but still present.  She has been using the bone stimulator since Saturday.   Relates burning and tingling in their feet. Patient is diabetic and last A1c was  Lab Results  Component Value Date   HGBA1C 6.8 (H) 08/07/2023   .   PCP:  Roanna Ezekiel [name], MD     PCP:  Roanna Ezekiel [name], MD    . Denies any other pedal complaints. Denies n/v/f/c.   Past Medical History:  Diagnosis Date   Allergy    Anal or rectal pain    sometimes   Anemia    hx of during pregnancy   Anxiety    Arthritis    Asthma    hx of   Bradycardia     I KNOW I HAVE BRADYCARDIA ESPECIALLY WHEN I SLEEP    Cataract 2021   bilateral eyes   Chronic back pain    Degenerative joint disease    osteo   Depression    Diabetes mellitus without complication (HCC)    DM type II   Diverticulosis 2003   Dysrhythmia    hx of  due to eye drop and also low heart rate 40's per pt. Dr. Court follows   Elevated total protein    Esophageal dysmotility    Fibromyalgia    GERD (gastroesophageal reflux disease)    subsequent Nissen Fundoplication   Glaucoma    Hearing loss    Heart murmur    was told she had a heart murmur   Hemorrhoids    Hiatal hernia 11/08/2009   Hx of adenomatous colonic polyps 07/02/2002   Hypercalcemia    Hyperlipidemia    Hyperlipidemia    Hypertension    Implantable loop recorder present 11/28/2021   Nausea    Osteoporosis    PONV (postoperative nausea and vomiting)    Rectal bleeding    from hemorrhoids.     Sleep apnea    DOES USE CPAP    Thrombocytopenia (HCC)    Varicose veins of left lower extremity     Objective:  Physical Exam: Vascular: DP/PT pulses 1/4 bilateral. CFT <3 seconds. Normal hair growth on digits. No edema.  Skin. No lacerations or  abrasions bilateral feet. Nails 1-5 bilateral are thickened elongated and with subungual debris. Scaling and erythema noted to mostly left plantar feet.  Musculoskeletal: MMT 5/5 bilateral lower extremities in DF, PF, Inversion and Eversion. Deceased ROM in DF of ankle joint. Collapse of medial arch bilateral with eversion of calcaneus. Tender to the latearl aspect of the foot around the fifth metatarsal diaphysis and proximally along the peroneal tendon insertion. No pain with ROM of the fifth digit. Most pain over proximal process of fifth metatarsal. Improvement noted in tenderness Neurological: Sensation intact to light touch.   Assessment:   1. 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 -Answered all patient questions -Xrays reviewed. Acute fracture noted to proximal fifth metatarsal noted through lateral half of bone just distal to tarsometatarsal joint. Some gapping noted but does appear to be some signs of healing noted today from previous.  -Discussed treatement options for fifth  metatarsal fracture non union; risks, alternatives, and benefits explained. -Discussed this type of fracture is typically surgical corrected but discussed given history of PVD would be risky to try and correct and issues with healing. Discussed conservative treatments for now.  -Continue CAM boot.  -Recommend protection, rest, ice, elevation daily until symptoms improve -Continue bone sitmulator.   -Patient to return to office in 4 weeks for serial x-rays to assess healing  or sooner if condition worsens.   Asberry Failing, DPM

## 2023-11-30 NOTE — Progress Notes (Signed)
Kindly inform the patient that temporal artery ultrasound does not show any evidence of giant cell arteritis

## 2023-12-03 ENCOUNTER — Other Ambulatory Visit: Payer: Self-pay | Admitting: Neurology

## 2023-12-03 DIAGNOSIS — G44039 Episodic paroxysmal hemicrania, not intractable: Secondary | ICD-10-CM | POA: Diagnosis not present

## 2023-12-03 DIAGNOSIS — L905 Scar conditions and fibrosis of skin: Secondary | ICD-10-CM | POA: Diagnosis not present

## 2023-12-03 DIAGNOSIS — N1831 Chronic kidney disease, stage 3a: Secondary | ICD-10-CM | POA: Diagnosis not present

## 2023-12-03 DIAGNOSIS — R52 Pain, unspecified: Secondary | ICD-10-CM | POA: Diagnosis not present

## 2023-12-03 DIAGNOSIS — R0609 Other forms of dyspnea: Secondary | ICD-10-CM | POA: Diagnosis not present

## 2023-12-03 DIAGNOSIS — E1165 Type 2 diabetes mellitus with hyperglycemia: Secondary | ICD-10-CM | POA: Diagnosis not present

## 2023-12-03 DIAGNOSIS — R251 Tremor, unspecified: Secondary | ICD-10-CM | POA: Diagnosis not present

## 2023-12-03 DIAGNOSIS — I455 Other specified heart block: Secondary | ICD-10-CM | POA: Diagnosis not present

## 2023-12-03 DIAGNOSIS — I10 Essential (primary) hypertension: Secondary | ICD-10-CM | POA: Diagnosis not present

## 2023-12-03 DIAGNOSIS — R55 Syncope and collapse: Secondary | ICD-10-CM | POA: Diagnosis not present

## 2023-12-03 DIAGNOSIS — G4733 Obstructive sleep apnea (adult) (pediatric): Secondary | ICD-10-CM | POA: Diagnosis not present

## 2023-12-03 DIAGNOSIS — R5383 Other fatigue: Secondary | ICD-10-CM | POA: Diagnosis not present

## 2023-12-03 NOTE — Telephone Encounter (Signed)
Last seen on 11/06/23 Follow up scheduled on 05/06/24

## 2023-12-09 NOTE — Progress Notes (Unsigned)
 [facility]   ASSESSMENT / PLAN: [name] is a 83 y.o. female status post right TCAR 08/09/23.  She has a painful, prominent scar in her right neck consistent with hypertrophic scarring.   Recommend:  Abstinence from all tobacco products. Blood glucose control with goal A1c < 7%. Blood pressure control with goal blood pressure < 140/90 mmHg. Lipid reduction therapy with goal LDL-C <100 mg/dL  Aspirin 81mg  PO QD.  Clopidogrel 75mg  PO QD. Atorvastatin 40-80mg  PO QD (or other "high intensity" statin therapy).  Patient is very tender around the neck incision, I suspect from hypertrophic scarring.  She describes pain and tenderness with flexion extension of the neck.  We will refer her for physical therapy for help with stretching and strengthening exercises of the neck.  I counseled her that scar revision may or may not help in this situation, and we should try physical therapy first.  She is amenable.  I will see her back in 3 months. If she is still having issues at this point, will refer to plastic surgery.  CHIEF COMPLAINT: follow up surgery  HISTORY OF PRESENT ILLNESS: [name] is a 83 y.o. female admitted to the hospital with left-sided weakness. Stroke workup was initiated. 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 She reports crescendo events prior to her presenting weakness. I reviewed her angiographic findings with her in detail. We reviewed the 2 main modes of carotid artery revascularization. I ultimately recommended TCAR given the anatomic challenges with her carotid artery stenosis.   09/10/23:.  She has done quite well.  Her left foot is bothering her, but this is a chronic issue from fracture.  She has no new neurologic deficits.  We reviewed her duplex in detail.  12/10/23: Patient returns to clinic for evaluation.  The patient has reported several weeks of pain  around the right neck incision from TCAR.  She had initially done very well having no recurrent neurologic events.  She continues to deny any new stroke or mini stroke symptoms.  They healed neck incision is very tender to her.  Tenderness extends from the clavicle up the sternocleidomastoid to the base of the neck.  She has pain with movement and pain with touch of the area.  No surrounding cellulitis or other signs of infection.  Past Medical History:  Diagnosis Date   Allergy    Anal or rectal pain    sometimes   Anemia    hx of during pregnancy   Anxiety    Arthritis    Asthma    hx of   Bradycardia    " I KNOW I HAVE BRADYCARDIA ESPECIALLY WHEN I SLEEP"    Carotid artery occlusion    Cataract 2021   bilateral eyes   Chronic back pain    Degenerative joint disease    osteo   Depression    Diabetes mellitus without complication (HCC)    DM type II   Diverticulosis 2003   Dysrhythmia    hx of  due to eye drop and also low heart rate 40's per pt. Dr. Allyson Sabal follows   Elevated total protein    Esophageal dysmotility    Fibromyalgia    GERD (gastroesophageal reflux disease)    subsequent Nissen Fundoplication   Glaucoma    Hearing loss    Heart murmur    "was told she had a heart murmur"  Hemorrhoids    Hiatal hernia 11/08/2009   Hx of adenomatous colonic polyps 07/02/2002   Hypercalcemia    Hyperlipidemia    Hyperlipidemia    Hypertension    Implantable loop recorder present 11/28/2021   Nausea    Osteoporosis    PONV (postoperative nausea and vomiting)    Rectal bleeding    from hemorrhoids.     Sleep apnea    DOES USE CPAP    Thrombocytopenia (HCC)    Varicose veins of left lower extremity     Past Surgical History:  Procedure Laterality Date   CARDIAC CATHETERIZATION  03/14/1992   Normal cardiac cath. Normal LV function.   CARDIOVASCULAR STRESS TEST  01/22/2011   No scintigraphic evidence of inducible ischemia.   CAROTID DOPPLER  03/31/2007   Bilateral  ICAs - no evidence of significant diameter reduction, dissectin, tortuosity, FMD, or any other vascular abnormality.   CATARACT EXTRACTION, BILATERAL     ESOPHAGEAL MANOMETRY N/A 11/14/2015   Procedure: ESOPHAGEAL MANOMETRY (EM);  Surgeon: Napoleon Form, MD;  Location: [facility];  Service: Endoscopy;  Laterality: N/A;   ESOPHAGEAL MANOMETRY N/A 01/18/2021   Procedure: ESOPHAGEAL MANOMETRY (EM);  Surgeon: Napoleon Form, MD;  Location: [facility];  Service: Endoscopy;  Laterality: N/A;   ESOPHAGOGASTRODUODENOSCOPY N/A 03/08/2021   Procedure: ESOPHAGOGASTRODUODENOSCOPY (EGD);  Surgeon: Corliss Skains, MD;  Location: [facility] OR;  Service: Thoracic;  Laterality: N/A;   EYE SURGERY     bilateral cataracts; bilateral stents   GASTRIC RESECTION  2009   GLAUCOMA SURGERY     JOINT REPLACEMENT     right knee Dr. Lequita Halt 06-23-18   KNEE SURGERY Bilateral    NISSEN FUNDOPLICATION  2000   with subsequent takedown in 2009   TOTAL KNEE ARTHROPLASTY Left 06/10/2017   Procedure: LEFT  TOTAL KNEE ARTHROPLASTY;  Surgeon: Ollen Gross, MD;  Location: [facility];  Service: Orthopedics;  Laterality: Left;  Adductor Block   TOTAL KNEE ARTHROPLASTY Right 06/23/2018   Procedure: RIGHT TOTAL KNEE ARTHROPLASTY;  Surgeon: Ollen Gross, MD;  Location: [facility];  Service: Orthopedics;  Laterality: Right;   TRANSCAROTID ARTERY REVASCULARIZATION  Right 08/09/2023   Procedure: TRANSCAROTID ARTERY REVASCULARIZATION;  Surgeon: Leonie Douglas, MD;  Location: [facility] OR;  Service: Vascular;  Laterality: Right;   TRANSTHORACIC ECHOCARDIOGRAM  12/21/2010   EF 60%, moderate LVH,    TUBAL LIGATION     XI ROBOTIC ASSISTED HIATAL HERNIA REPAIR N/A 03/08/2021   Procedure: XI ROBOTIC ASSISTED LAPAROSCOPY WITH LYSIS OF ADHESIONS;  Surgeon: Corliss Skains, MD;  Location: [facility] OR;  Service: Thoracic;  Laterality: N/A;    Family History  Problem Relation Age of Onset   Heart disease Mother    Hypertension Mother    Diabetes  Mother    Diabetes Father    Kidney disease Father    Stroke Brother    Cancer Maternal Grandmother    Kidney disease Maternal Grandfather    Breast cancer Daughter    Stomach cancer Maternal Aunt    Uterine cancer Maternal Aunt    Leukemia Maternal Uncle    Prostate cancer Maternal Uncle    Colon cancer Neg Hx    Rectal cancer Neg Hx    Esophageal cancer Neg Hx     Social History   Socioeconomic History   Marital status: Married    Spouse name: Not on file   Number of children: 8   Years of education: Not on file   Highest education level:  Not on file  Occupational History   Occupation: retired    Comment: retired Clinical biochemist.   Tobacco Use   Smoking status: Never   Smokeless tobacco: Never  Vaping Use   Vaping status: Never Used  Substance and Sexual Activity   Alcohol use: No    Alcohol/week: 0.0 standard drinks of alcohol   Drug use: No   Sexual activity: Yes  Other Topics Concern   Not on file  Social History Narrative   Husband, Jeslyn Amsler is Next of Kin. Cell # [phone]      Right handed    Wear glasses drinks    Social Drivers of Health   Financial Resource Strain: Not on file  Food Insecurity: No Food Insecurity (08/07/2023)   Hunger Vital Sign    Worried About Running Out of Food in the Last Year: Never true    Ran Out of Food in the Last Year: Never true  Transportation Needs: No Transportation Needs (08/07/2023)   PRAPARE - Administrator, Civil Service (Medical): No    Lack of Transportation (Non-Medical): No  Physical Activity: Not on file  Stress: Not on file  Social Connections: Not on file  Intimate Partner Violence: Not At Risk (08/07/2023)   Humiliation, Afraid, Rape, and Kick questionnaire    Fear of Current or Ex-Partner: No    Emotionally Abused: No    Physically Abused: No    Sexually Abused: No    Allergies  Allergen Reactions   Adalat [Nifedipine] Other (See Comments)    Tired, Made pt "feel crazy in the  head"   Hydrocodone Itching and Nausea And Vomiting   Oxycodone-Acetaminophen Itching   Meperidine Hcl Other (See Comments)    Demerol causes hallucinations   Naproxen Nausea And Vomiting    Current Outpatient Medications  Medication Sig Dispense Refill   acetaminophen (TYLENOL) 500 MG tablet Take 1,000 mg by mouth 2 (two) times daily as needed for headache (back and knee pain).     albuterol (VENTOLIN HFA) 108 (90 Base) MCG/ACT inhaler Inhale 1 puff into the lungs as needed for wheezing or shortness of breath.     amLODipine (NORVASC) 10 MG tablet Take 10 mg by mouth daily. Per note patient taking for 90 days.     ascorbic acid (VITAMIN C) 500 MG tablet Take 1 tablet (500 mg total) by mouth daily. 90 tablet 0   brimonidine (ALPHAGAN) 0.2 % ophthalmic solution Place 1 drop into both eyes 2 (two) times daily.     cetirizine (ZYRTEC) 10 MG tablet Take 10 mg by mouth at bedtime.     Cholecalciferol (VITAMIN D3) 250 MCG (10000 UT) capsule Take 10,000 Units by mouth daily.     clopidogrel (PLAVIX) 75 MG tablet Take 1 tablet (75 mg total) by mouth daily. 30 tablet 1   dexlansoprazole (DEXILANT) 60 MG capsule Take 1 capsule (60 mg total) by mouth daily. 90 capsule 3   ezetimibe (ZETIA) 10 MG tablet Take 1 tablet (10 mg total) by mouth daily. 30 tablet 0   hydrocortisone (ANUSOL-HC) 25 MG suppository Place 1 suppository (25 mg total) rectally at bedtime. 5 suppository 1   indomethacin (INDOCIN) 50 MG capsule PLEASE SEE ATTACHED FOR DETAILED DIRECTIONS 90 capsule 0   ketoconazole (NIZORAL) 2 % cream Apply 1 Application topically daily. 60 g 2   latanoprost (XALATAN) 0.005 % ophthalmic solution Place 1 drop into the right eye at bedtime.     mirtazapine (REMERON) 7.5 MG tablet  Take 7.5 mg by mouth at bedtime.     montelukast (SINGULAIR) 10 MG tablet Take 10 mg by mouth at bedtime.     nystatin ointment (MYCOSTATIN) Apply 1 application  topically See admin instructions. 1 application 1-2 times a day  as needed for rash  0   OZEMPIC, 0.25 OR 0.5 MG/DOSE, 2 MG/3ML SOPN SMARTSIG:0.25 Milligram(s) SUB-Q Once a Week     Polyethyl Glycol-Propyl Glycol (SYSTANE OP) Place 1 drop into both eyes daily as needed (dry/irritated eyes).     rosuvastatin (CRESTOR) 40 MG tablet Take 40 mg by mouth at bedtime.     senna-docusate (SENOKOT-S) 8.6-50 MG tablet Take 2 tablets by mouth 2 (two) times daily. 60 tablet 0   sertraline (ZOLOFT) 100 MG tablet Take 100 mg by mouth every morning.     sucralfate (CARAFATE) 1 g tablet Take 1 tablet (1 g total) by mouth 2 (two) times daily as needed. Do not take within 2 hours of any other medications. 60 tablet 3   telmisartan (MICARDIS) 80 MG tablet Take 80 mg by mouth every morning.     Zinc 50 MG TABS Take 220 mg by mouth daily.     zinc sulfate 220 (50 Zn) MG capsule Take 1 capsule (220 mg total) by mouth daily. 90 capsule 0   metFORMIN (GLUCOPHAGE) 500 MG tablet Take 500 mg by mouth every morning. (Patient not taking: Reported on 12/10/2023)     polyethylene glycol powder (GLYCOLAX/MIRALAX) 17 GM/SCOOP powder Take 17 g by mouth daily. 476 g 0   No current facility-administered medications for this visit.    PHYSICAL EXAM Vitals:   12/10/23 0852 12/10/23 0855  BP: 107/61 107/65  Pulse: 69   Temp: 98.3 F (36.8 C)   TempSrc: Temporal   SpO2: 96%   Weight: 226 lb 4.8 oz (102.6 kg)   Height: 5\' 10"  (1.778 m)     Elderly woman.  Appears younger than stated age. Palpable radial pulses bilaterally Right neck incision with hypertrophic scar that does appear adhesed to local soft tissue structures. Scar is very tender to touch    PERTINENT LABORATORY AND RADIOLOGIC DATA  Most recent CBC    Latest Ref Rng & Units 08/17/2023    9:58 PM 08/11/2023    3:29 AM 08/10/2023    5:45 AM  CBC  WBC 4.0 - 10.5 K/uL 6.4  6.1  7.3   Hemoglobin 12.0 - 15.0 g/dL 16.1  09.6  04.5   Hematocrit 36.0 - 46.0 % 38.6  34.0  34.9   Platelets 150 - 400 K/uL 201  149  167       Most recent CMP    Latest Ref Rng & Units 08/17/2023    9:58 PM 08/11/2023    3:29 AM 08/10/2023    5:45 AM  CMP  Glucose 70 - 99 mg/dL 409  811  914   BUN 8 - 23 mg/dL 16  13  13    Creatinine 0.44 - 1.00 mg/dL 7.82  9.56  2.13   Sodium 135 - 145 mmol/L 137  139  137   Potassium 3.5 - 5.1 mmol/L 4.2  3.9  4.0   Chloride 98 - 111 mmol/L 104  107  105   CO2 22 - 32 mmol/L 24  24  22    Calcium 8.9 - 10.3 mg/dL 9.6  8.7  8.7     Renal function CrCl cannot be calculated (Patient's most recent lab result is older than the maximum  21 days allowed.).  Hgb A1c MFr Bld (%)  Date Value  08/07/2023 6.8 (H)    LDL Chol Calc (NIH)  Date Value Ref Range Status  10/26/2020 115 (H) 0 - 99 mg/dL Final   LDL Cholesterol  Date Value Ref Range Status  08/10/2023 71 0 - 99 mg/dL Final    Comment:           Total Cholesterol/HDL:CHD Risk Coronary Heart Disease Risk Table                     Men   Women  1/2 Average Risk   3.4   3.3  Average Risk       5.0   4.4  2 X Average Risk   9.6   7.1  3 X Average Risk  23.4   11.0        Use the calculated Patient Ratio above and the CHD Risk Table to determine the patient's CHD Risk.        ATP III CLASSIFICATION (LDL):  <100     mg/dL   Optimal  409-811  mg/dL   Near or Above                    Optimal  130-159  mg/dL   Borderline  914-782  mg/dL   High  >956     mg/dL   Very High Performed at [facility], [address] [place], [place] [address]     Carotid duplex 09/06/2023  Summary:  Right Carotid: Patent stent with no evidence for restenosis.   Left Carotid: Velocities in the left ICA are consistent with a 1-39%  stenosis.   Vertebrals: Bilateral vertebral arteries demonstrate antegrade flow.  Subclavians: Normal flow hemodynamics were seen in bilateral subclavian               arteries.   Rande Brunt. Lenell Antu, MD FACS [facility] Phone Number: [phone] 12/10/2023 11:46  AM   Total time spent on preparing this encounter including chart review, data review, collecting history, examining the patient, coordinating care for this established patient, 30 minutes.  Portions of this report may have been transcribed using voice recognition software.  Every effort has been made to ensure accuracy; however, inadvertent computerized transcription errors may still be present.

## 2023-12-10 ENCOUNTER — Ambulatory Visit (INDEPENDENT_AMBULATORY_CARE_PROVIDER_SITE_OTHER): Payer: Medicare Other | Admitting: Vascular Surgery

## 2023-12-10 ENCOUNTER — Encounter: Payer: Self-pay | Admitting: Vascular Surgery

## 2023-12-10 VITALS — BP 107/65 | HR 69 | Temp 98.3°F | Ht 70.0 in | Wt 226.3 lb

## 2023-12-10 DIAGNOSIS — L910 Hypertrophic scar: Secondary | ICD-10-CM

## 2023-12-10 DIAGNOSIS — E7849 Other hyperlipidemia: Secondary | ICD-10-CM | POA: Diagnosis not present

## 2023-12-10 DIAGNOSIS — Z95828 Presence of other vascular implants and grafts: Secondary | ICD-10-CM | POA: Diagnosis not present

## 2023-12-10 DIAGNOSIS — N1831 Chronic kidney disease, stage 3a: Secondary | ICD-10-CM | POA: Diagnosis not present

## 2023-12-10 DIAGNOSIS — R5383 Other fatigue: Secondary | ICD-10-CM | POA: Diagnosis not present

## 2023-12-10 DIAGNOSIS — R0609 Other forms of dyspnea: Secondary | ICD-10-CM | POA: Diagnosis not present

## 2023-12-23 ENCOUNTER — Ambulatory Visit: Payer: Medicare Other | Attending: Cardiology

## 2023-12-23 DIAGNOSIS — R55 Syncope and collapse: Secondary | ICD-10-CM | POA: Diagnosis not present

## 2023-12-24 ENCOUNTER — Other Ambulatory Visit: Payer: Self-pay

## 2023-12-24 ENCOUNTER — Ambulatory Visit: Attending: Vascular Surgery

## 2023-12-24 DIAGNOSIS — M6281 Muscle weakness (generalized): Secondary | ICD-10-CM | POA: Diagnosis not present

## 2023-12-24 DIAGNOSIS — M542 Cervicalgia: Secondary | ICD-10-CM | POA: Insufficient documentation

## 2023-12-24 LAB — CUP PACEART REMOTE DEVICE CHECK
Date Time Interrogation Session: 20250309231810
Implantable Pulse Generator Implant Date: 20240222

## 2023-12-24 NOTE — Therapy (Signed)
 OUTPATIENT PHYSICAL THERAPY CERVICAL EVALUATION   Patient Name: [name] MRN: [id number] DOB:[birth year], 83 y.o., female Today's Date: 12/24/2023  END OF SESSION:  PT End of Session - 12/24/23 0852     Visit Number 1    Number of Visits 9    Date for PT Re-Evaluation 01/21/24    Authorization Type MCR    Progress Note Due on Visit 10    PT Start Time 0845    PT Stop Time 0915    PT Time Calculation (min) 30 min    Behavior During Therapy WFL for tasks assessed/performed             Past Medical History:  Diagnosis Date   Allergy    Anal or rectal pain    sometimes   Anemia    hx of during pregnancy   Anxiety    Arthritis    Asthma    hx of   Bradycardia    " I KNOW I HAVE BRADYCARDIA ESPECIALLY WHEN I SLEEP"    Carotid artery occlusion    Cataract 2021   bilateral eyes   Chronic back pain    Degenerative joint disease    osteo   Depression    Diabetes mellitus without complication (HCC)    DM type II   Diverticulosis 2003   Dysrhythmia    hx of  due to eye drop and also low heart rate 40's per pt. Dr. Allyson Sabal follows   Elevated total protein    Esophageal dysmotility    Fibromyalgia    GERD (gastroesophageal reflux disease)    subsequent Nissen Fundoplication   Glaucoma    Hearing loss    Heart murmur    "was told she had a heart murmur"   Hemorrhoids    Hiatal hernia 11/08/2009   Hx of adenomatous colonic polyps 07/02/2002   Hypercalcemia    Hyperlipidemia    Hyperlipidemia    Hypertension    Implantable loop recorder present 11/28/2021   Nausea    Osteoporosis    PONV (postoperative nausea and vomiting)    Rectal bleeding    from hemorrhoids.     Sleep apnea    DOES USE CPAP    Thrombocytopenia (HCC)    Varicose veins of left lower extremity    Past Surgical History:  Procedure Laterality Date   CARDIAC CATHETERIZATION  03/14/1992   Normal cardiac cath. Normal LV function.   CARDIOVASCULAR STRESS TEST  01/22/2011   No scintigraphic  evidence of inducible ischemia.   CAROTID DOPPLER  03/31/2007   Bilateral ICAs - no evidence of significant diameter reduction, dissectin, tortuosity, FMD, or any other vascular abnormality.   CATARACT EXTRACTION, BILATERAL     ESOPHAGEAL MANOMETRY N/A 11/14/2015   Procedure: ESOPHAGEAL MANOMETRY (EM);  Surgeon: Napoleon Form, MD;  Location: [facility];  Service: Endoscopy;  Laterality: N/A;   ESOPHAGEAL MANOMETRY N/A 01/18/2021   Procedure: ESOPHAGEAL MANOMETRY (EM);  Surgeon: Napoleon Form, MD;  Location: [facility];  Service: Endoscopy;  Laterality: N/A;   ESOPHAGOGASTRODUODENOSCOPY N/A 03/08/2021   Procedure: ESOPHAGOGASTRODUODENOSCOPY (EGD);  Surgeon: Corliss Skains, MD;  Location: [facility] OR;  Service: Thoracic;  Laterality: N/A;   EYE SURGERY     bilateral cataracts; bilateral stents   GASTRIC RESECTION  2009   GLAUCOMA SURGERY     JOINT REPLACEMENT     right knee Dr. Lequita Halt 06-23-18   KNEE SURGERY Bilateral    NISSEN FUNDOPLICATION  2000   with subsequent  takedown in 2009   TOTAL KNEE ARTHROPLASTY Left 06/10/2017   Procedure: LEFT  TOTAL KNEE ARTHROPLASTY;  Surgeon: Ollen Gross, MD;  Location: [facility];  Service: Orthopedics;  Laterality: Left;  Adductor Block   TOTAL KNEE ARTHROPLASTY Right 06/23/2018   Procedure: RIGHT TOTAL KNEE ARTHROPLASTY;  Surgeon: Ollen Gross, MD;  Location: [facility];  Service: Orthopedics;  Laterality: Right;   TRANSCAROTID ARTERY REVASCULARIZATION  Right 08/09/2023   Procedure: TRANSCAROTID ARTERY REVASCULARIZATION;  Surgeon: Leonie Douglas, MD;  Location: [facility] OR;  Service: Vascular;  Laterality: Right;   TRANSTHORACIC ECHOCARDIOGRAM  12/21/2010   EF 60%, moderate LVH,    TUBAL LIGATION     XI ROBOTIC ASSISTED HIATAL HERNIA REPAIR N/A 03/08/2021   Procedure: XI ROBOTIC ASSISTED LAPAROSCOPY WITH LYSIS OF ADHESIONS;  Surgeon: Corliss Skains, MD;  Location: [facility] OR;  Service: Thoracic;  Laterality: N/A;   Patient Active Problem List    Diagnosis Date Noted   Closed nondisplaced fracture of fifth metatarsal bone of left foot 08/07/2023   Stroke (HCC) 08/07/2023   Carotid stenosis, asymptomatic, right 08/07/2023   CVA (cerebral vascular accident) (HCC) 08/06/2023   Syncope 10/22/2022   Acute renal failure superimposed on stage 3a chronic kidney disease (HCC) 05/22/2022   AKI (acute kidney injury) (HCC) 05/21/2022   Hiatal hernia 03/08/2021   Heartburn    Ineffective esophageal motility    Dyspnea on exertion 07/24/2020   Change in bowel habits 08/22/2018   Knee stiff 07/23/2018   Degeneration of lumbar intervertebral disc 02/04/2018   Scoliosis of lumbar spine 02/04/2018   Sinus bradycardia 07/27/2016   Cough    Essential hypertension 11/24/2014   Gonalgia 05/13/2013   OA (osteoarthritis) of knee 05/13/2013   Degenerative joint disease involving multiple joints 01/19/2013   Rheumatoid arthritis (HCC) 01/19/2013   Thyroid nodule 06/11/2012   Elevated total protein    Cyst of thyroid 10/29/2011   Hemorrhoids, internal 05/28/2011   Acid reflux 02/14/2011   Avitaminosis D 02/13/2011   Glaucoma 11/28/2010   HLD (hyperlipidemia) 12/14/2009   Internal and external bleeding hemorrhoids 12/14/2009   DIVERTICULOSIS, COLON 12/14/2009   Joint pain 12/14/2009   BACK PAIN, CHRONIC 12/14/2009   FIBROMYALGIA 12/14/2009   OSTEOPOROSIS 12/14/2009   Sleep apnea 12/14/2009   History of colonic polyps 12/14/2009   GASTRIC POLYP, HX OF 12/14/2009   Diaphragmatic hernia 10/04/2009   Depression 09/29/2009   REFLUX ESOPHAGITIS 09/29/2009   GERD 09/29/2009   Constipation 09/29/2009   DYSPHAGIA 09/29/2009    PCP: Harvest Forest, MD   REFERRING PROVIDER: Leonie Douglas, MD   REFERRING DIAG:  Neck pain after TCAR      THERAPY DIAG:  No diagnosis found.  Rationale for Evaluation and Treatment: Rehabilitation  ONSET DATE: 07/29/2024  SUBJECTIVE:  SUBJECTIVE STATEMENT: Patient reports to PT with history of fall in October following suspected CVA. She states that in October, they placed a stent in her R carotid artery. She states that the pain feels like she is "being strangled" especially with looking over her Right shoulder. She is ambulating with L immobilization boot and rollator. She states that her baseline is walking cane. She states that she was supposed to have shoulder replacement in November.    Hand dominance: Right  PERTINENT HISTORY:  ***  PAIN:  Are you having pain?  Yes: NPRS scale: 3-10/10 Pain location: R side of neck  Pain description: tightness Aggravating factors: looking over her right shoulder Relieving factors: cold compress  PRECAUTIONS: Fall  RED FLAGS: None     WEIGHT BEARING RESTRICTIONS: No  FALLS:  Has patient fallen in last 6 months? Yes. Number of falls see MOI; and a couple of close calls  LIVING ENVIRONMENT: Lives with: lives with their spouse Lives in: House/apartment Stairs:  Yes, two external steps with railing  Has following equipment at home: Dan Humphreys - 4 wheeled  OCCUPATION: retired  PLOF: Independent  PATIENT GOALS: To get rid of this uncomfortable feeling in my neck   NEXT MD VISIT: 03/10/2024  OBJECTIVE:  Note: Objective measures were completed at Evaluation unless otherwise noted.  DIAGNOSTIC FINDINGS:  08/07/2023 CT angio head neck w wo cm   IMPRESSION: 1. A 70% stenosis of the proximal right ICA due to mixed density atherosclerosis. This is a likely source of the previously demonstrated right MCA territory embolic infarcts. 2. Severe stenosis of the right vertebral artery V1 segment and occlusion of the V4 segment, just proximal to the vertebrobasilar confluence. Right PICA remains patent. 3. 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  PATIENT SURVEYS:  NDI 28/50 = 56%   COGNITION: Overall cognitive status: Within functional limits for tasks assessed  SENSATION: {sensation:27233}  POSTURE: {posture:25561}  PALPATION: ***   CERVICAL ROM:   Active ROM A/PROM (deg) eval  Flexion 50  Extension 10  Right lateral flexion 40  Left lateral flexion 30  Right rotation 50  Left rotation 30   (Blank rows = not tested)  UPPER EXTREMITY ROM:  Active ROM Right eval Left eval  Shoulder flexion    Shoulder extension    Shoulder abduction    Shoulder adduction    Shoulder extension    Shoulder internal rotation    Shoulder external rotation    Elbow flexion    Elbow extension    Wrist flexion    Wrist extension    Wrist ulnar deviation    Wrist radial deviation    Wrist pronation    Wrist supination     (Blank rows = not tested)  UPPER EXTREMITY MMT:  MMT Right eval Left eval  Shoulder flexion 2 2+  Shoulder extension    Shoulder abduction 2 2+  Shoulder adduction    Shoulder extension    Shoulder internal rotation    Shoulder external rotation    Middle trapezius    Lower trapezius    Elbow flexion    Elbow extension    Wrist flexion    Wrist extension    Wrist ulnar deviation    Wrist radial deviation    Wrist pronation    Wrist supination    Grip strength 20# 20#   (Blank rows = not tested)  CERVICAL SPECIAL TESTS:  {Cervical special tests:25246}  FUNCTIONAL TESTS:  {  Functional tests:24029}   TREATMENT DATE:   [facility] Adult PT Treatment:                                                DATE: 12/24/2023   Initial evaluation: see patient education and home exercise program as noted below                                                                                                                                   PATIENT EDUCATION:  Education details: reviewed initial home exercise program; discussion of POC, prognosis and goals for  skilled PT  Person educated: Patient Education method: Explanation, Demonstration, and Handouts Education comprehension: verbalized understanding, returned demonstration, and needs further education  HOME EXERCISE PROGRAM: Written HEP to be provided at follow up visit    ASSESSMENT:  CLINICAL IMPRESSION: Devonda is a 83 y.o. female who was seen today for physical therapy evaluation and treatment for neck pain with mobility deficits. She is demonstrating ***. She has related pain and difficulty with ***. She requires skilled PT services at this time to address relevant deficits and improve overall function.     OBJECTIVE IMPAIRMENTS: {opptimpairments:25111}.   ACTIVITY LIMITATIONS: {activitylimitations:27494}  PARTICIPATION LIMITATIONS: {participationrestrictions:25113}  PERSONAL FACTORS: {Personal factors:25162} are also affecting patient's functional outcome.   REHAB POTENTIAL: {rehabpotential:25112}  CLINICAL DECISION MAKING: {clinical decision making:25114}  EVALUATION COMPLEXITY: {Evaluation complexity:25115}   GOALS: Goals reviewed with patient? Yes  SHORT TERM GOALS = LONG TERM GOALS: Target date: 01/21/2024   Patient will report improved overall functional ability with NDI score of *** Baseline:  Goal status: INITIAL  2.  *** look over shoulder Baseline:  Goal status: INITIAL  3.  *** grip strength  Baseline:  Goal status: INITIAL  4.  *** hep  Baseline:  Goal status: INITIAL  5.  *** decreased pain severity  Baseline:  Goal status: INITIAL    PLAN:  PT FREQUENCY: 1-2x/week  PT DURATION: 4 weeks  PLANNED INTERVENTIONS: 97164- PT Re-evaluation, 97110-Therapeutic exercises, 97530- Therapeutic activity, [id number]- Neuromuscular re-education, 97535- Self Care, 16109- Manual therapy, and Patient/Family education  PLAN FOR NEXT SESSION: Mauri Reading, PT, DPT  12/24/2023 9:16 AM

## 2023-12-24 NOTE — Progress Notes (Signed)
 [facility] Summary Report / Loop Recorder

## 2023-12-25 ENCOUNTER — Ambulatory Visit (INDEPENDENT_AMBULATORY_CARE_PROVIDER_SITE_OTHER): Payer: Medicare Other | Admitting: Podiatry

## 2023-12-25 ENCOUNTER — Ambulatory Visit (INDEPENDENT_AMBULATORY_CARE_PROVIDER_SITE_OTHER)

## 2023-12-25 ENCOUNTER — Encounter: Payer: Self-pay | Admitting: Podiatry

## 2023-12-25 DIAGNOSIS — M79674 Pain in right toe(s): Secondary | ICD-10-CM | POA: Diagnosis not present

## 2023-12-25 DIAGNOSIS — I739 Peripheral vascular disease, unspecified: Secondary | ICD-10-CM

## 2023-12-25 DIAGNOSIS — B351 Tinea unguium: Secondary | ICD-10-CM

## 2023-12-25 DIAGNOSIS — E1142 Type 2 diabetes mellitus with diabetic polyneuropathy: Secondary | ICD-10-CM | POA: Diagnosis not present

## 2023-12-25 DIAGNOSIS — M79675 Pain in left toe(s): Secondary | ICD-10-CM | POA: Diagnosis not present

## 2023-12-25 DIAGNOSIS — S92355K Nondisplaced fracture of fifth metatarsal bone, left foot, subsequent encounter for fracture with nonunion: Secondary | ICD-10-CM

## 2023-12-25 NOTE — Progress Notes (Signed)
 Subjective:  Patient ID: [name], female    DOB: [birth year],   MRN: [id number]  No chief complaint on file.   83 y.o. female presents for follow-up of fifth metatarsal fracture. Relates she is doing ok and pain better she still relates shooting pains through the foot to the toes.   She has been using the bone stimulator consistently.   Relates burning and tingling in their feet. Patient is diabetic and last A1c was  Lab Results  Component Value Date   HGBA1C 6.8 (H) 08/07/2023   .  Also concern of thickened elongated and painful nails that are difficult to trim. Requesting to have them trimmed today. R  PCP:  Harvest Forest, MD      PCP:  Harvest Forest, MD    . Denies any other pedal complaints. Denies n/v/f/c.   Past Medical History:  Diagnosis Date   Allergy    Anal or rectal pain    sometimes   Anemia    hx of during pregnancy   Anxiety    Arthritis    Asthma    hx of   Bradycardia    " I KNOW I HAVE BRADYCARDIA ESPECIALLY WHEN I SLEEP"    Carotid artery occlusion    Cataract 2021   bilateral eyes   Chronic back pain    Degenerative joint disease    osteo   Depression    Diabetes mellitus without complication (HCC)    DM type II   Diverticulosis 2003   Dysrhythmia    hx of  due to eye drop and also low heart rate 40's per pt. Dr. Allyson Sabal follows   Elevated total protein    Esophageal dysmotility    Fibromyalgia    GERD (gastroesophageal reflux disease)    subsequent Nissen Fundoplication   Glaucoma    Hearing loss    Heart murmur    "was told she had a heart murmur"   Hemorrhoids    Hiatal hernia 11/08/2009   Hx of adenomatous colonic polyps 07/02/2002   Hypercalcemia    Hyperlipidemia    Hyperlipidemia    Hypertension    Implantable loop recorder present 11/28/2021   Nausea    Osteoporosis    PONV (postoperative nausea and vomiting)    Rectal bleeding    from hemorrhoids.     Sleep apnea    DOES USE CPAP    Thrombocytopenia (HCC)     Varicose veins of left lower extremity     Objective:  Physical Exam: Vascular: DP/PT pulses 1/4 bilateral. CFT <3 seconds. Normal hair growth on digits. No edema.  Skin. No lacerations or abrasions bilateral feet. Nails 1-5 bilateral are thickened elongated and with subungual debris. Scaling and erythema noted to mostly left plantar feet.  Musculoskeletal: MMT 5/5 bilateral lower extremities in DF, PF, Inversion and Eversion. Deceased ROM in DF of ankle joint. Collapse of medial arch bilateral with eversion of calcaneus. Tender to the latearl aspect of the foot around the fifth metatarsal diaphysis and proximally along the peroneal tendon insertion. No pain with ROM of the fifth digit. Most pain over proximal process of fifth metatarsal. Improvement noted in tenderness Neurological: Sensation intact to light touch.   Assessment:   1. 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 Pain due to onychomycosis of toenails of both feet   3. Type 2 diabetes mellitus with diabetic polyneuropathy, without long-term current use of insulin (  HCC)   4. PVD (peripheral vascular disease) (HCC)       Plan:  Patient was evaluated and treated and all questions answered. -Answered all patient questions -Xrays reviewed. Acute fracture noted to proximal fifth metatarsal noted through lateral half of bone just distal to tarsometatarsal joint. Fracture line appears to be nearly completely healed.  -Discussed treatement options for fifth metatarsal fracture non union; risks, alternatives, and benefits explained. -Discussed this type of fracture is typically surgical corrected but discussed given history of PVD would be risky to try and correct and issues with healing. Discussed conservative treatments for now.  -Continue CAM boot for two more weeks then may transition to regular shoe and see if this helps. Potentially boot could be irritating nerves.   -Recommend protection, rest, ice, elevation daily until symptoms improve -Continue bone sitmulator.   -Patient to return to office in 4 weeks for serial x-rays to assess healing  or sooner if condition worsens.   -Discussed and educated patient on diabetic foot care, especially with  regards to the vascular, neurological and musculoskeletal systems.  -Stressed the importance of good glycemic control and the detriment of not  controlling glucose levels in relation to the foot. -Discussed supportive shoes at all times and checking feet regularly.  -Mechanically debrided all nails 1-5 bilateral using sterile nail nipper and filed with dremel without incident  -Answered all patient questions -Patient advised to call the office if any problems or questions arise in the meantime.   Louann Sjogren, DPM

## 2023-12-27 DIAGNOSIS — E7849 Other hyperlipidemia: Secondary | ICD-10-CM | POA: Diagnosis not present

## 2023-12-29 NOTE — Progress Notes (Unsigned)
  Electrophysiology Office Note:   Date:  12/30/2023  ID:  JALEIA HANKE, DOB 1941/04/03, MRN [id number]  Primary Cardiologist: Thomasene Ripple, DO Primary Heart Failure: None Electrophysiologist: Will Jorja Loa, MD      History of Present Illness:   [name] is a 83 y.o. female with h/o bradycardia, syncope, HTN, HLD, CVA, R CEA, OSA, CKD 3a, GERD, DM, & fibromyalgia  seen today for routine electrophysiology followup.   Since last being seen in our clinic the patient reports doing well overall. She has issues with her neck scar from her CEA and her foot due to a Jones fracture. Feels like she gets tired at times with exertion but she can not be very active and attributes this to deconditioning. She notes she had an episodes of chest discomfort in the middle of the night a while back and it went away spontaneously.  No further episodes since.   She denies palpitations, dyspnea, PND, orthopnea, nausea, vomiting, dizziness, syncope, edema, weight gain, or early satiety.   Review of systems complete and found to be negative unless listed in HPI.   EP Information / Studies Reviewed:    EKG is not ordered today. EKG from 10/08/23 reviewed which showed NSR 61 bpm      Studies:  Cardiac CT 11/2021 > calcium score 0 ETT 05/2022 > negative for ischemia, normal chronotropic respnose ECHO 07/2023 > LVEF 65-70%, G1DD, LA mod dilated, RA mildly dilated     Arrhythmia / AAD Bradycardia > s/p ILR    Device MDT ILR 12/06/22  Risk Assessment/Calculations:              Physical Exam:   VS:  BP 120/74 (BP Location: Left Arm, Patient Position: Sitting, Cuff Size: Large)   Pulse 72   Ht 5\' 10"  (1.778 m)   Wt 221 lb (100.2 kg)   SpO2 97%   BMI 31.71 kg/m    Wt Readings from Last 3 Encounters:  12/30/23 221 lb (100.2 kg)  12/10/23 226 lb 4.8 oz (102.6 kg)  11/06/23 219 lb 12.8 oz (99.7 kg)     GEN: Well nourished, well developed in no acute distress NECK: No JVD; No carotid  bruits CARDIAC: Regular rate and rhythm, no murmurs, rubs, gallops RESPIRATORY:  Clear to auscultation without rales, wheezing or rhonchi  ABDOMEN: Soft, non-tender, non-distended EXTREMITIES:  No edema; No deformity. Left foot in a boot.   ASSESSMENT AND PLAN:    Bradycardia Episode of suspected Vasovagal / CHB during daytime Sleep (09/2023) Hx Syncope  OSA  -MDT ILR review shows no AF episodes -no further pause episodes  -avoid AV nodal blocking agents / avoid with baseline bradycardia -no indication for PPM at this time  -follows with [facility] for OSA, wears CPAP consistently at night but does not wear when asleep during daytime  Hypertension  -well controlled on current regimen   Episode of Chest Discomfort  -CCS of 0, negative stress testing > more likely GI in nature, lasted few minutes then resolved  -no further testing at this time    Follow up with Dr. Elberta Fortis in 12 months  Signed, Canary Brim, NP-C, AGACNP-BC [facility] - Electrophysiology  12/30/2023, 11:11 AM

## 2023-12-30 ENCOUNTER — Ambulatory Visit: Payer: Medicare Other | Attending: Pulmonary Disease | Admitting: Pulmonary Disease

## 2023-12-30 ENCOUNTER — Encounter: Payer: Self-pay | Admitting: Pulmonary Disease

## 2023-12-30 VITALS — BP 120/74 | HR 72 | Ht 70.0 in | Wt 221.0 lb

## 2023-12-30 DIAGNOSIS — G4733 Obstructive sleep apnea (adult) (pediatric): Secondary | ICD-10-CM | POA: Diagnosis not present

## 2023-12-30 DIAGNOSIS — R001 Bradycardia, unspecified: Secondary | ICD-10-CM | POA: Insufficient documentation

## 2023-12-30 DIAGNOSIS — R55 Syncope and collapse: Secondary | ICD-10-CM | POA: Diagnosis not present

## 2023-12-30 NOTE — Patient Instructions (Signed)
Medication Instructions:  Your physician recommends that you continue on your current medications as directed. Please refer to the Current Medication list given to you today.  *If you need a refill on your cardiac medications before your next appointment, please call your pharmacy*  Lab Work: None ordered If you have labs (blood work) drawn today and your tests are completely normal, you will receive your results only by: MyChart Message (if you have MyChart) OR A paper copy in the mail If you have any lab test that is abnormal or we need to change your treatment, we will call you to review the results.  Follow-Up: At [facility] you and your health needs are our priority.  As part of our continuing mission to provide you with exceptional heart care, we have created designated Provider Care Teams.  These Care Teams include your primary Cardiologist (physician) and Advanced Practice Providers (APPs -  Physician Assistants and Nurse Practitioners) who all work together to provide you with the care you need, when you need it.  Your next appointment:   1 year(s)  Provider:   Loman Brooklyn, MD or Canary Brim, NP

## 2024-01-02 ENCOUNTER — Ambulatory Visit

## 2024-01-02 DIAGNOSIS — M542 Cervicalgia: Secondary | ICD-10-CM | POA: Diagnosis not present

## 2024-01-02 DIAGNOSIS — M6281 Muscle weakness (generalized): Secondary | ICD-10-CM

## 2024-01-02 NOTE — Therapy (Signed)
 OUTPATIENT PHYSICAL THERAPY CERVICAL EVALUATION   Patient Name: [name] MRN: [id number] DOB:1941-07-02, 83 y.o., female Today's Date: 01/02/2024  END OF SESSION:  PT End of Session - 01/02/24 1134     Visit Number 2    Number of Visits 9    Date for PT Re-Evaluation 01/21/24    Authorization Type MCR    Progress Note Due on Visit 10    PT Start Time 1131    PT Stop Time 1211    PT Time Calculation (min) 40 min    Behavior During Therapy WFL for tasks assessed/performed              Past Medical History:  Diagnosis Date   Allergy    Anal or rectal pain    sometimes   Anemia    hx of during pregnancy   Anxiety    Arthritis    Asthma    hx of   Bradycardia    " I KNOW I HAVE BRADYCARDIA ESPECIALLY WHEN I SLEEP"    Carotid artery occlusion    Cataract 2021   bilateral eyes   Chronic back pain    Degenerative joint disease    osteo   Depression    Diabetes mellitus without complication (HCC)    DM type II   Diverticulosis 2003   Dysrhythmia    hx of  due to eye drop and also low heart rate 40's per pt. Dr. Allyson Sabal follows   Elevated total protein    Esophageal dysmotility    Fibromyalgia    GERD (gastroesophageal reflux disease)    subsequent Nissen Fundoplication   Glaucoma    Hearing loss    Heart murmur    "was told she had a heart murmur"   Hemorrhoids    Hiatal hernia 11/08/2009   Hx of adenomatous colonic polyps 07/02/2002   Hypercalcemia    Hyperlipidemia    Hyperlipidemia    Hypertension    Implantable loop recorder present 11/28/2021   Nausea    Osteoporosis    PONV (postoperative nausea and vomiting)    Rectal bleeding    from hemorrhoids.     Sleep apnea    DOES USE CPAP    Thrombocytopenia (HCC)    Varicose veins of left lower extremity    Past Surgical History:  Procedure Laterality Date   CARDIAC CATHETERIZATION  03/14/1992   Normal cardiac cath. Normal LV function.   CARDIOVASCULAR STRESS TEST  01/22/2011   No scintigraphic  evidence of inducible ischemia.   CAROTID DOPPLER  03/31/2007   Bilateral ICAs - no evidence of significant diameter reduction, dissectin, tortuosity, FMD, or any other vascular abnormality.   CATARACT EXTRACTION, BILATERAL     ESOPHAGEAL MANOMETRY N/A 11/14/2015   Procedure: ESOPHAGEAL MANOMETRY (EM);  Surgeon: Napoleon Form, MD;  Location: [facility];  Service: Endoscopy;  Laterality: N/A;   ESOPHAGEAL MANOMETRY N/A 01/18/2021   Procedure: ESOPHAGEAL MANOMETRY (EM);  Surgeon: Napoleon Form, MD;  Location: [facility];  Service: Endoscopy;  Laterality: N/A;   ESOPHAGOGASTRODUODENOSCOPY N/A 03/08/2021   Procedure: ESOPHAGOGASTRODUODENOSCOPY (EGD);  Surgeon: Corliss Skains, MD;  Location[facility];  Service: Thoracic;  Laterality: N/A;   EYE SURGERY     bilateral cataracts; bilateral stents   GASTRIC RESECTION  2009   GLAUCOMA SURGERY     JOINT REPLACEMENT     right knee Dr. Lequita Halt 06-23-18   KNEE SURGERY Bilateral    NISSEN FUNDOPLICATION  2000   with  subsequent takedown in 2009   TOTAL KNEE ARTHROPLASTY Left 06/10/2017   Procedure: LEFT  TOTAL KNEE ARTHROPLASTY;  Surgeon: Ollen Gross, MD;  Location: [facility];  Service: Orthopedics;  Laterality: Left;  Adductor Block   TOTAL KNEE ARTHROPLASTY Right 06/23/2018   Procedure: RIGHT TOTAL KNEE ARTHROPLASTY;  Surgeon: Ollen Gross, MD;  Location: [facility];  Service: Orthopedics;  Laterality: Right;   TRANSCAROTID ARTERY REVASCULARIZATION  Right 08/09/2023   Procedure: TRANSCAROTID ARTERY REVASCULARIZATION;  Surgeon: Leonie Douglas, MD;  Location: [facility] OR;  Service: Vascular;  Laterality: Right;   TRANSTHORACIC ECHOCARDIOGRAM  12/21/2010   EF 60%, moderate LVH,    TUBAL LIGATION     XI ROBOTIC ASSISTED HIATAL HERNIA REPAIR N/A 03/08/2021   Procedure: XI ROBOTIC ASSISTED LAPAROSCOPY WITH LYSIS OF ADHESIONS;  Surgeon: Corliss Skains, MD;  Location: [facility] OR;  Service: Thoracic;  Laterality: N/A;   Patient Active Problem List    Diagnosis Date Noted   Closed nondisplaced fracture of fifth metatarsal bone of left foot 08/07/2023   Stroke (HCC) 08/07/2023   Carotid stenosis, asymptomatic, right 08/07/2023   CVA (cerebral vascular accident) (HCC) 08/06/2023   Syncope 10/22/2022   Acute renal failure superimposed on stage 3a chronic kidney disease (HCC) 05/22/2022   AKI (acute kidney injury) (HCC) 05/21/2022   Hiatal hernia 03/08/2021   Heartburn    Ineffective esophageal motility    Dyspnea on exertion 07/24/2020   Change in bowel habits 08/22/2018   Knee stiff 07/23/2018   Degeneration of lumbar intervertebral disc 02/04/2018   Scoliosis of lumbar spine 02/04/2018   Sinus bradycardia 07/27/2016   Cough    Essential hypertension 11/24/2014   Gonalgia 05/13/2013   OA (osteoarthritis) of knee 05/13/2013   Degenerative joint disease involving multiple joints 01/19/2013   Rheumatoid arthritis (HCC) 01/19/2013   Thyroid nodule 06/11/2012   Elevated total protein    Cyst of thyroid 10/29/2011   Hemorrhoids, internal 05/28/2011   Acid reflux 02/14/2011   Avitaminosis D 02/13/2011   Glaucoma 11/28/2010   HLD (hyperlipidemia) 12/14/2009   Internal and external bleeding hemorrhoids 12/14/2009   DIVERTICULOSIS, COLON 12/14/2009   Joint pain 12/14/2009   BACK PAIN, CHRONIC 12/14/2009   FIBROMYALGIA 12/14/2009   OSTEOPOROSIS 12/14/2009   Sleep apnea 12/14/2009   History of colonic polyps 12/14/2009   GASTRIC POLYP, HX OF 12/14/2009   Diaphragmatic hernia 10/04/2009   Depression 09/29/2009   REFLUX ESOPHAGITIS 09/29/2009   GERD 09/29/2009   Constipation 09/29/2009   DYSPHAGIA 09/29/2009    PCP: Harvest Forest, MD   REFERRING PROVIDER: Leonie Douglas, MD   REFERRING DIAG:  Neck pain after TCAR      THERAPY DIAG:  Cervicalgia  Muscle weakness (generalized)  Rationale for Evaluation and Treatment: Rehabilitation  ONSET DATE: 07/29/2024  SUBJECTIVE:  SUBJECTIVE STATEMENT: Patient reporting no worsening of symptoms since initial eval. She continues to feel limited with pain and tension on right side of neck.   EVAL: Patient reports to PT with history of fall in October following suspected CVA. She states that in October, they placed a stent in her R carotid artery. She states that the pain feels like she is "being strangled" especially with looking over her Right shoulder. She is ambulating with L immobilization boot and rollator d/t reported Karmela Bram fracture related to her fall. She states that her baseline is walking cane. She states that she was supposed to have shoulder replacement in November. She is reconsidering if she wants to undergo surgery and the post-op rehab because of her age.    Hand dominance: Right  PERTINENT HISTORY:  Relevant PMHx includes anxiety, arthritis, bradycardia, carotid artery occlusion, depression, DM type II, fibromyalgia, glaucoma, hearing loss, HTN, CVA, carotid stenosis, scoliosis of lumbar spine  PAIN:  Are you having pain?  Yes: NPRS scale: 3-10/10 Pain location: R side of neck  Pain description: tightness Aggravating factors: looking over her right shoulder Relieving factors: cold compress  PRECAUTIONS: Fall  RED FLAGS: None     WEIGHT BEARING RESTRICTIONS: No  FALLS:  Has patient fallen in last 6 months? Yes. Number of falls see MOI; and a couple of close calls  LIVING ENVIRONMENT: Lives with: lives with their spouse Lives in: House/apartment Stairs:  Yes, two external steps with railing  Has following equipment at home: Dan Humphreys - 4 wheeled  OCCUPATION: retired  PLOF: Independent  PATIENT GOALS: To get rid of this uncomfortable feeling in my neck   NEXT MD VISIT: 03/10/2024  OBJECTIVE:  Note: Objective measures were completed  at Evaluation unless otherwise noted.  DIAGNOSTIC FINDINGS:  08/07/2023 CT angio head neck w wo cm   IMPRESSION: 1. A 70% stenosis of the proximal right ICA due to mixed density atherosclerosis. This is a likely source of the previously demonstrated right MCA territory embolic infarcts. 2. Severe stenosis of the right vertebral artery V1 segment and occlusion of the V4 segment, just proximal to the vertebrobasilar confluence. Right PICA remains patent. 3. 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  PATIENT SURVEYS:  NDI 28/50 = 56%   COGNITION: Overall cognitive status: Within functional limits for tasks assessed  SENSATION: Not tested  POSTURE: rounded shoulders and forward head   CERVICAL ROM:   Active ROM A/PROM (deg) eval  Flexion 50  Extension 10  Right lateral flexion 40  Left lateral flexion 30  Right rotation 50  Left rotation 30   (Blank rows = not tested)  UPPER EXTREMITY ROM:  Active ROM Right eval Left eval  Shoulder flexion    Shoulder extension    Shoulder abduction    Shoulder adduction    Shoulder extension    Shoulder internal rotation    Shoulder external rotation    Elbow flexion    Elbow extension    Wrist flexion    Wrist extension    Wrist ulnar deviation    Wrist radial deviation    Wrist pronation    Wrist supination     (Blank rows = not tested)  UPPER EXTREMITY MMT:  MMT Right eval Left eval  Shoulder flexion 2 2+  Shoulder extension    Shoulder abduction 2 2+  Shoulder adduction    Shoulder extension    Shoulder internal rotation    Shoulder external rotation  Middle trapezius    Lower trapezius    Elbow flexion    Elbow extension    Wrist flexion    Wrist extension    Wrist ulnar deviation    Wrist radial deviation    Wrist pronation    Wrist supination    Grip strength 20# 20#   (Blank rows = not tested)  CERVICAL SPECIAL TESTS:  Not assessed at time of eval    FUNCTIONAL TESTS:  Not assessed at time of eval   TREATMENT DATE:    [facility] Adult PT Treatment:                                                DATE: 01/02/2024  Therapeutic Exercise: Seated CS rotation AROM x 10 each side  Seated CS flexion/extension AROM x 10 each side  Seated CS side bending AROM x 10 each side Cervical retraction 2 x 10, 3 sec hold Levator stretch 2 x 30 sec each  Upper trap stretch 2 x 30 sec each  SCM stretch 3 x 10 sec (right scm only)  Seated IT unresisted x 10 each  Scapular retraction,  x15, 3 sec hold UBE level 1, 3 min fwd/3 min back  Updated and Reviewed initial HEP     [facility] Adult PT Treatment:                                                DATE: 12/24/2023   Initial evaluation: see patient education and home exercise program as noted below                                                                                                                                   PATIENT EDUCATION:  Education details: reviewed initial home exercise program; discussion of POC, prognosis and goals for skilled PT  Person educated: Patient Education method: Explanation, Demonstration, and Handouts Education comprehension: verbalized understanding, returned demonstration, and needs further education  HOME EXERCISE PROGRAM: Access Code: [email or web address] URL: [email or web address] Date: 01/02/2024 Prepared by: Mauri Reading  Exercises - Seated Upper Trapezius Stretch  - 2 x daily - 7 x weekly - 2 sets - 30 sec hold - Gentle Levator Scapulae Stretch  - 2 x daily - 7 x weekly - 2 sets - 30 sec hold - Sternocleidomastoid Stretch  - 2 x daily - 7 x weekly - 2 sets - 30 sec hold - Seated Cervical Retraction Protraction AROM  - 1 x daily - 3-4 x weekly - 2 sets - 10 reps - 3 sec hold - Seated Scapular Retraction  - 1 x daily - 3-4 x weekly - 2 sets - 10  reps - 3 sec hold  ASSESSMENT:  CLINICAL IMPRESSION: 01/02/2024 Patient responded well to initial  treatment today. She had most difficulty with seated shoulder flexion and abduction d/t shoulder pain bilaterally. Provided patient with initial HEP and reviewed at end of session. She will continue to benefit from right SCM mm stretching. We will continue with current POC as tolerated.   EVAL: Isys is a 83 y.o. female who was seen today for physical therapy evaluation and treatment for neck pain with mobility deficits. She is demonstrating decreased CS AROM, decrease UE AROM, decreased UE MMT scores, and diminished grip strength BIL. She has related pain and difficulty with looking over her right shoulder and performing related ADLs/IADLs. She requires skilled PT services at this time to address relevant deficits and improve overall function.     OBJECTIVE IMPAIRMENTS: decreased activity tolerance, decreased ROM, decreased strength, postural dysfunction, and pain.   ACTIVITY LIMITATIONS: carrying, lifting, reach over head, and hygiene/grooming  PARTICIPATION LIMITATIONS: meal prep, cleaning, and community activity  PERSONAL FACTORS: Age, Time since onset of injury/illness/exacerbation, and 3+ comorbidities: Relevant PMHx includes anxiety, arthritis, bradycardia, carotid artery occlusion, depression, DM type II, fibromyalgia, glaucoma, hearing loss, HTN, CVA, carotid stenosis, scoliosis of lumbar spine  are also affecting patient's functional outcome.   REHAB POTENTIAL: Fair    CLINICAL DECISION MAKING: Evolving/moderate complexity  EVALUATION COMPLEXITY: Moderate   GOALS: Goals reviewed with patient? Yes  SHORT TERM GOALS = LONG TERM GOALS: Target date: 01/21/2024   Patient will report improved overall functional ability with NDI score of 15/50 or less. Baseline: NDI 28/50 = 56% Goal status: INITIAL  2.  Patient will report no more than minimal pain or difficulty with looking over her R shoulder.  Baseline: moderate-to-severe pain  Goal status: INITIAL  3.  Patient will demonstrate  improved grip strength to at least 25lbs bilaterally Baseline: 20lbs bilaterally Goal status: INITIAL  4.  Patient will be independent with initial home program for CS mobility and UQ strengthening.  Baseline: provided at f/u visit Goal status: INITIAL  5.  Patient will report 5/10 worst pain with daily activities over the past week.  Baseline: 10/10 worst pain  Goal status: INITIAL    PLAN:  PT FREQUENCY: 1-2x/week  PT DURATION: 4 weeks  PLANNED INTERVENTIONS: [id number]- PT Re-evaluation, 97110-Therapeutic exercises, 97530- Therapeutic activity, [id number]- Neuromuscular re-education, 97535- Self Care, [id number]- Manual therapy, and Patient/Family education  PLAN FOR NEXT SESSION: address CS mobility, gentle SCM stretch, UQ strengthening   Mauri Reading, PT, DPT  01/02/2024 12:17 PM

## 2024-01-04 ENCOUNTER — Ambulatory Visit

## 2024-01-04 DIAGNOSIS — M542 Cervicalgia: Secondary | ICD-10-CM | POA: Diagnosis not present

## 2024-01-04 DIAGNOSIS — M6281 Muscle weakness (generalized): Secondary | ICD-10-CM | POA: Diagnosis not present

## 2024-01-04 NOTE — Therapy (Signed)
 OUTPATIENT PHYSICAL THERAPY TREATMENT NOTE    Patient Name: [name] MRN: [id number] DOB:1941-09-15, 83 y.o., female Today's Date: 01/04/2024  END OF SESSION:  PT End of Session - 01/04/24 1027     Visit Number 3    Number of Visits 9    Date for PT Re-Evaluation 01/21/24    Authorization Type MCR    Progress Note Due on Visit 10    PT Start Time 1030    PT Stop Time 1108    PT Time Calculation (min) 38 min    Activity Tolerance Patient tolerated treatment well;Patient limited by pain    Behavior During Therapy WFL for tasks assessed/performed              Past Medical History:  Diagnosis Date   Allergy    Anal or rectal pain    sometimes   Anemia    hx of during pregnancy   Anxiety    Arthritis    Asthma    hx of   Bradycardia    " I KNOW I HAVE BRADYCARDIA ESPECIALLY WHEN I SLEEP"    Carotid artery occlusion    Cataract 2021   bilateral eyes   Chronic back pain    Degenerative joint disease    osteo   Depression    Diabetes mellitus without complication (HCC)    DM type II   Diverticulosis 2003   Dysrhythmia    hx of  due to eye drop and also low heart rate 40's per pt. Dr. Allyson Sabal follows   Elevated total protein    Esophageal dysmotility    Fibromyalgia    GERD (gastroesophageal reflux disease)    subsequent Nissen Fundoplication   Glaucoma    Hearing loss    Heart murmur    "was told she had a heart murmur"   Hemorrhoids    Hiatal hernia 11/08/2009   Hx of adenomatous colonic polyps 07/02/2002   Hypercalcemia    Hyperlipidemia    Hyperlipidemia    Hypertension    Implantable loop recorder present 11/28/2021   Nausea    Osteoporosis    PONV (postoperative nausea and vomiting)    Rectal bleeding    from hemorrhoids.     Sleep apnea    DOES USE CPAP    Thrombocytopenia (HCC)    Varicose veins of left lower extremity    Past Surgical History:  Procedure Laterality Date   CARDIAC CATHETERIZATION  03/14/1992   Normal cardiac cath.  Normal LV function.   CARDIOVASCULAR STRESS TEST  01/22/2011   No scintigraphic evidence of inducible ischemia.   CAROTID DOPPLER  03/31/2007   Bilateral ICAs - no evidence of significant diameter reduction, dissectin, tortuosity, FMD, or any other vascular abnormality.   CATARACT EXTRACTION, BILATERAL     ESOPHAGEAL MANOMETRY N/A 11/14/2015   Procedure: ESOPHAGEAL MANOMETRY (EM);  Surgeon: Napoleon Form, MD;  Location: [facility];  Service: Endoscopy;  Laterality: N/A;   ESOPHAGEAL MANOMETRY N/A 01/18/2021   Procedure: ESOPHAGEAL MANOMETRY (EM);  Surgeon: Napoleon Form, MD;  Location: [facility];  Service: Endoscopy;  Laterality: N/A;   ESOPHAGOGASTRODUODENOSCOPY N/A 03/08/2021   Procedure: ESOPHAGOGASTRODUODENOSCOPY (EGD);  Surgeon: Corliss Skains, MD;  Location: [facility];  Service: Thoracic;  Laterality: N/A;   EYE SURGERY     bilateral cataracts; bilateral stents   GASTRIC RESECTION  2009   GLAUCOMA SURGERY     JOINT REPLACEMENT     right knee Dr. Lequita Halt 06-23-18  KNEE SURGERY Bilateral    NISSEN FUNDOPLICATION  2000   with subsequent takedown in 2009   TOTAL KNEE ARTHROPLASTY Left 06/10/2017   Procedure: LEFT  TOTAL KNEE ARTHROPLASTY;  Surgeon: Ollen Gross, MD;  Location: [facility];  Service: Orthopedics;  Laterality: Left;  Adductor Block   TOTAL KNEE ARTHROPLASTY Right 06/23/2018   Procedure: RIGHT TOTAL KNEE ARTHROPLASTY;  Surgeon: Ollen Gross, MD;  Location: [facility];  Service: Orthopedics;  Laterality: Right;   TRANSCAROTID ARTERY REVASCULARIZATION  Right 08/09/2023   Procedure: TRANSCAROTID ARTERY REVASCULARIZATION;  Surgeon: Leonie Douglas, MD;  Location: [facility] OR;  Service: Vascular;  Laterality: Right;   TRANSTHORACIC ECHOCARDIOGRAM  12/21/2010   EF 60%, moderate LVH,    TUBAL LIGATION     XI ROBOTIC ASSISTED HIATAL HERNIA REPAIR N/A 03/08/2021   Procedure: XI ROBOTIC ASSISTED LAPAROSCOPY WITH LYSIS OF ADHESIONS;  Surgeon: Corliss Skains, MD;  Location: [facility] OR;   Service: Thoracic;  Laterality: N/A;   Patient Active Problem List   Diagnosis Date Noted   Closed nondisplaced fracture of fifth metatarsal bone of left foot 08/07/2023   Stroke (HCC) 08/07/2023   Carotid stenosis, asymptomatic, right 08/07/2023   CVA (cerebral vascular accident) (HCC) 08/06/2023   Syncope 10/22/2022   Acute renal failure superimposed on stage 3a chronic kidney disease (HCC) 05/22/2022   AKI (acute kidney injury) (HCC) 05/21/2022   Hiatal hernia 03/08/2021   Heartburn    Ineffective esophageal motility    Dyspnea on exertion 07/24/2020   Change in bowel habits 08/22/2018   Knee stiff 07/23/2018   Degeneration of lumbar intervertebral disc 02/04/2018   Scoliosis of lumbar spine 02/04/2018   Sinus bradycardia 07/27/2016   Cough    Essential hypertension 11/24/2014   Gonalgia 05/13/2013   OA (osteoarthritis) of knee 05/13/2013   Degenerative joint disease involving multiple joints 01/19/2013   Rheumatoid arthritis (HCC) 01/19/2013   Thyroid nodule 06/11/2012   Elevated total protein    Cyst of thyroid 10/29/2011   Hemorrhoids, internal 05/28/2011   Acid reflux 02/14/2011   Avitaminosis D 02/13/2011   Glaucoma 11/28/2010   HLD (hyperlipidemia) 12/14/2009   Internal and external bleeding hemorrhoids 12/14/2009   DIVERTICULOSIS, COLON 12/14/2009   Joint pain 12/14/2009   BACK PAIN, CHRONIC 12/14/2009   FIBROMYALGIA 12/14/2009   OSTEOPOROSIS 12/14/2009   Sleep apnea 12/14/2009   History of colonic polyps 12/14/2009   GASTRIC POLYP, HX OF 12/14/2009   Diaphragmatic hernia 10/04/2009   Depression 09/29/2009   REFLUX ESOPHAGITIS 09/29/2009   GERD 09/29/2009   Constipation 09/29/2009   DYSPHAGIA 09/29/2009    PCP: Harvest Forest, MD   REFERRING PROVIDER: Leonie Douglas, MD   REFERRING DIAG:  Neck pain after TCAR     THERAPY DIAG:  Cervicalgia  Muscle weakness (generalized)  Rationale for Evaluation and Treatment: Rehabilitation  ONSET  DATE: 07/29/2024  SUBJECTIVE:  SUBJECTIVE STATEMENT: Patient reports that she felt sore after last session, has continued having same Rt shoulder/neck pain.   EVAL: Patient reports to PT with history of fall in October following suspected CVA. She states that in October, they placed a stent in her R carotid artery. She states that the pain feels like she is "being strangled" especially with looking over her Right shoulder. She is ambulating with L immobilization boot and rollator d/t reported jones fracture related to her fall. She states that her baseline is walking cane. She states that she was supposed to have shoulder replacement in November. She is reconsidering if she wants to undergo surgery and the post-op rehab because of her age.    Hand dominance: Right  PERTINENT HISTORY:  Relevant PMHx includes anxiety, arthritis, bradycardia, carotid artery occlusion, depression, DM type II, fibromyalgia, glaucoma, hearing loss, HTN, CVA, carotid stenosis, scoliosis of lumbar spine  PAIN:  Are you having pain?  Yes: NPRS scale: 3-10/10 Pain location: R side of neck  Pain description: tightness Aggravating factors: looking over her right shoulder Relieving factors: cold compress  PRECAUTIONS: Fall  RED FLAGS: None     WEIGHT BEARING RESTRICTIONS: No  FALLS:  Has patient fallen in last 6 months? Yes. Number of falls see MOI; and a couple of close calls  LIVING ENVIRONMENT: Lives with: lives with their spouse Lives in: House/apartment Stairs:  Yes, two external steps with railing  Has following equipment at home: Dan Humphreys - 4 wheeled  OCCUPATION: retired  PLOF: Independent  PATIENT GOALS: To get rid of this uncomfortable feeling in my neck   NEXT MD VISIT: 03/10/2024  OBJECTIVE:   Note: Objective measures were completed at Evaluation unless otherwise noted.  DIAGNOSTIC FINDINGS:  08/07/2023 CT angio head neck w wo cm   IMPRESSION: 1. A 70% stenosis of the proximal right ICA due to mixed density atherosclerosis. This is a likely source of the previously demonstrated right MCA territory embolic infarcts. 2. Severe stenosis of the right vertebral artery V1 segment and occlusion of the V4 segment, just proximal to the vertebrobasilar confluence. Right PICA remains patent. 3. 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  PATIENT SURVEYS:  NDI 28/50 = 56%   COGNITION: Overall cognitive status: Within functional limits for tasks assessed  SENSATION: Not tested  POSTURE: rounded shoulders and forward head   CERVICAL ROM:   Active ROM A/PROM (deg) eval  Flexion 50  Extension 10  Right lateral flexion 40  Left lateral flexion 30  Right rotation 50  Left rotation 30   (Blank rows = not tested)  UPPER EXTREMITY ROM:  Active ROM Right eval Left eval  Shoulder flexion    Shoulder extension    Shoulder abduction    Shoulder adduction    Shoulder extension    Shoulder internal rotation    Shoulder external rotation    Elbow flexion    Elbow extension    Wrist flexion    Wrist extension    Wrist ulnar deviation    Wrist radial deviation    Wrist pronation    Wrist supination     (Blank rows = not tested)  UPPER EXTREMITY MMT:  MMT Right eval Left eval  Shoulder flexion 2 2+  Shoulder extension    Shoulder abduction 2 2+  Shoulder adduction    Shoulder extension    Shoulder internal rotation    Shoulder external rotation    Middle trapezius  Lower trapezius    Elbow flexion    Elbow extension    Wrist flexion    Wrist extension    Wrist ulnar deviation    Wrist radial deviation    Wrist pronation    Wrist supination    Grip strength 20# 20#   (Blank rows = not tested)  CERVICAL SPECIAL  TESTS:  Not assessed at time of eval   FUNCTIONAL TESTS:  Not assessed at time of eval   TREATMENT DATE:  [facility] Adult PT Treatment:                                                DATE: 01/04/2024 Therapeutic Exercise: UBE level 1, 3 min fwd/3 min back  Seated CS rotation AROM x 10 each side  Seated CS flexion/extension AROM x 10 each side  Seated CS side bending AROM x 10 each side Cervical retraction 2 x 10, 3 sec hold Levator stretch 2 x 30 sec each  Upper trap stretch 2 x 30 sec each  SCM stretch 3 x 10 sec (right scm only)  Seated IT's unresisted x 10 each  Seated rows YTB 3" hold 2x10 Self Care/Home Management: Scar densensitization techniques Theraputty to work on grip strength   [facility] Adult PT Treatment:                                                DATE: 01/02/2024  Therapeutic Exercise: Seated CS rotation AROM x 10 each side  Seated CS flexion/extension AROM x 10 each side  Seated CS side bending AROM x 10 each side Cervical retraction 2 x 10, 3 sec hold Levator stretch 2 x 30 sec each  Upper trap stretch 2 x 30 sec each  SCM stretch 3 x 10 sec (right scm only)  Seated IT unresisted x 10 each  Scapular retraction,  x15, 3 sec hold UBE level 1, 3 min fwd/3 min back  Updated and Reviewed initial HEP     [facility] Adult PT Treatment:                                                DATE: 12/24/2023   Initial evaluation: see patient education and home exercise program as noted below    PATIENT EDUCATION:  Education details: reviewed initial home exercise program; discussion of POC, prognosis and goals for skilled PT  Person educated: Patient Education method: Explanation, Demonstration, and Handouts Education comprehension: verbalized understanding, returned demonstration, and needs further education  HOME EXERCISE PROGRAM: Access Code: [email or web address] URL: [email or web address] Date: 01/02/2024 Prepared by: Mauri Reading  Exercises - Seated Upper  Trapezius Stretch  - 2 x daily - 7 x weekly - 2 sets - 30 sec hold - Gentle Levator Scapulae Stretch  - 2 x daily - 7 x weekly - 2 sets - 30 sec hold - Sternocleidomastoid Stretch  - 2 x daily - 7 x weekly - 2 sets - 30 sec hold - Seated Cervical Retraction Protraction AROM  - 1 x daily - 3-4 x weekly - 2 sets - 10 reps - 3  sec hold - Seated Scapular Retraction  - 1 x daily - 3-4 x weekly - 2 sets - 10 reps - 3 sec hold  ASSESSMENT:  CLINICAL IMPRESSION: 01/04/2024 Patient presents to PT reporting continued Rt sided neck/shoulder pain and that she has been compliant with her HEP. She states that she is still having trouble sleeping. Session today continued to focus on CS mobility and stretching and BIL shoulder strengthening. Patient continues to benefit from skilled PT services and should be progressed as able to improve functional independence.   EVAL: Fendi is a 83 y.o. female who was seen today for physical therapy evaluation and treatment for neck pain with mobility deficits. She is demonstrating decreased CS AROM, decrease UE AROM, decreased UE MMT scores, and diminished grip strength BIL. She has related pain and difficulty with looking over her right shoulder and performing related ADLs/IADLs. She requires skilled PT services at this time to address relevant deficits and improve overall function.     OBJECTIVE IMPAIRMENTS: decreased activity tolerance, decreased ROM, decreased strength, postural dysfunction, and pain.   ACTIVITY LIMITATIONS: carrying, lifting, reach over head, and hygiene/grooming  PARTICIPATION LIMITATIONS: meal prep, cleaning, and community activity  PERSONAL FACTORS: Age, Time since onset of injury/illness/exacerbation, and 3+ comorbidities: Relevant PMHx includes anxiety, arthritis, bradycardia, carotid artery occlusion, depression, DM type II, fibromyalgia, glaucoma, hearing loss, HTN, CVA, carotid stenosis, scoliosis of lumbar spine  are also affecting patient's  functional outcome.   REHAB POTENTIAL: Fair    CLINICAL DECISION MAKING: Evolving/moderate complexity  EVALUATION COMPLEXITY: Moderate   GOALS: Goals reviewed with patient? Yes  SHORT TERM GOALS = LONG TERM GOALS: Target date: 01/21/2024   Patient will report improved overall functional ability with NDI score of 15/50 or less. Baseline: NDI 28/50 = 56% Goal status: INITIAL  2.  Patient will report no more than minimal pain or difficulty with looking over her R shoulder.  Baseline: moderate-to-severe pain  Goal status: INITIAL  3.  Patient will demonstrate improved grip strength to at least 25lbs bilaterally Baseline: 20lbs bilaterally Goal status: INITIAL  4.  Patient will be independent with initial home program for CS mobility and UQ strengthening.  Baseline: provided at f/u visit Goal status: INITIAL  5.  Patient will report 5/10 worst pain with daily activities over the past week.  Baseline: 10/10 worst pain  Goal status: INITIAL   PLAN:  PT FREQUENCY: 1-2x/week  PT DURATION: 4 weeks  PLANNED INTERVENTIONS: 97164- PT Re-evaluation, 97110-Therapeutic exercises, 97530- Therapeutic activity, [id number]- Neuromuscular re-education, 97535- Self Care, [id number]- Manual therapy, and Patient/Family education  PLAN FOR NEXT SESSION: address CS mobility, gentle SCM stretch, UQ strengthening   Berta Minor PTA  01/04/2024 11:07 AM

## 2024-01-07 ENCOUNTER — Ambulatory Visit: Admitting: Physical Therapy

## 2024-01-07 ENCOUNTER — Encounter: Payer: Self-pay | Admitting: Physical Therapy

## 2024-01-07 DIAGNOSIS — M542 Cervicalgia: Secondary | ICD-10-CM

## 2024-01-07 DIAGNOSIS — M6281 Muscle weakness (generalized): Secondary | ICD-10-CM

## 2024-01-07 NOTE — Therapy (Signed)
 OUTPATIENT PHYSICAL THERAPY TREATMENT NOTE    Patient Name: [name] MRN: [id number] DOB:29-Jun-1941, 83 y.o., female Today's Date: 01/07/2024  END OF SESSION:  PT End of Session - 01/07/24 1048     Visit Number 4    Number of Visits 9    Date for PT Re-Evaluation 01/21/24    Authorization Type MCR    Progress Note Due on Visit 10    PT Start Time 1048    PT Stop Time 1130    PT Time Calculation (min) 42 min    Activity Tolerance Patient tolerated treatment well;Patient limited by pain    Behavior During Therapy WFL for tasks assessed/performed              Past Medical History:  Diagnosis Date   Allergy    Anal or rectal pain    sometimes   Anemia    hx of during pregnancy   Anxiety    Arthritis    Asthma    hx of   Bradycardia    " I KNOW I HAVE BRADYCARDIA ESPECIALLY WHEN I SLEEP"    Carotid artery occlusion    Cataract 2021   bilateral eyes   Chronic back pain    Degenerative joint disease    osteo   Depression    Diabetes mellitus without complication (HCC)    DM type II   Diverticulosis 2003   Dysrhythmia    hx of  due to eye drop and also low heart rate 40's per pt. Dr. Allyson Sabal follows   Elevated total protein    Esophageal dysmotility    Fibromyalgia    GERD (gastroesophageal reflux disease)    subsequent Nissen Fundoplication   Glaucoma    Hearing loss    Heart murmur    "was told she had a heart murmur"   Hemorrhoids    Hiatal hernia 11/08/2009   Hx of adenomatous colonic polyps 07/02/2002   Hypercalcemia    Hyperlipidemia    Hyperlipidemia    Hypertension    Implantable loop recorder present 11/28/2021   Nausea    Osteoporosis    PONV (postoperative nausea and vomiting)    Rectal bleeding    from hemorrhoids.     Sleep apnea    DOES USE CPAP    Thrombocytopenia (HCC)    Varicose veins of left lower extremity    Past Surgical History:  Procedure Laterality Date   CARDIAC CATHETERIZATION  03/14/1992   Normal cardiac cath.  Normal LV function.   CARDIOVASCULAR STRESS TEST  01/22/2011   No scintigraphic evidence of inducible ischemia.   CAROTID DOPPLER  03/31/2007   Bilateral ICAs - no evidence of significant diameter reduction, dissectin, tortuosity, FMD, or any other vascular abnormality.   CATARACT EXTRACTION, BILATERAL     ESOPHAGEAL MANOMETRY N/A 11/14/2015   Procedure: ESOPHAGEAL MANOMETRY (EM);  Surgeon: Napoleon Form, MD;  Location: [facility];  Service: Endoscopy;  Laterality: N/A;   ESOPHAGEAL MANOMETRY N/A 01/18/2021   Procedure: ESOPHAGEAL MANOMETRY (EM);  Surgeon: Napoleon Form, MD;  Location: [facility];  Service: Endoscopy;  Laterality: N/A;   ESOPHAGOGASTRODUODENOSCOPY N/A 03/08/2021   Procedure: ESOPHAGOGASTRODUODENOSCOPY (EGD);  Surgeon: Corliss Skains, MD;  Location: [facility] OR;  Service: Thoracic;  Laterality: N/A;   EYE SURGERY     bilateral cataracts; bilateral stents   GASTRIC RESECTION  2009   GLAUCOMA SURGERY     JOINT REPLACEMENT     right knee Dr. Lequita Halt 06-23-18  KNEE SURGERY Bilateral    NISSEN FUNDOPLICATION  2000   with subsequent takedown in 2009   TOTAL KNEE ARTHROPLASTY Left 06/10/2017   Procedure: LEFT  TOTAL KNEE ARTHROPLASTY;  Surgeon: Ollen Gross, MD;  Location: [facility];  Service: Orthopedics;  Laterality: Left;  Adductor Block   TOTAL KNEE ARTHROPLASTY Right 06/23/2018   Procedure: RIGHT TOTAL KNEE ARTHROPLASTY;  Surgeon: Ollen Gross, MD;  Location: [facility];  Service: Orthopedics;  Laterality: Right;   TRANSCAROTID ARTERY REVASCULARIZATION  Right 08/09/2023   Procedure: TRANSCAROTID ARTERY REVASCULARIZATION;  Surgeon: Leonie Douglas, MD;  Location: [facility];  Service: Vascular;  Laterality: Right;   TRANSTHORACIC ECHOCARDIOGRAM  12/21/2010   EF 60%, moderate LVH,    TUBAL LIGATION     XI ROBOTIC ASSISTED HIATAL HERNIA REPAIR N/A 03/08/2021   Procedure: XI ROBOTIC ASSISTED LAPAROSCOPY WITH LYSIS OF ADHESIONS;  Surgeon: Corliss Skains, MD;  Location: [facility] OR;   Service: Thoracic;  Laterality: N/A;   Patient Active Problem List   Diagnosis Date Noted   Closed nondisplaced fracture of fifth metatarsal bone of left foot 08/07/2023   Stroke (HCC) 08/07/2023   Carotid stenosis, asymptomatic, right 08/07/2023   CVA (cerebral vascular accident) (HCC) 08/06/2023   Syncope 10/22/2022   Acute renal failure superimposed on stage 3a chronic kidney disease (HCC) 05/22/2022   AKI (acute kidney injury) (HCC) 05/21/2022   Hiatal hernia 03/08/2021   Heartburn    Ineffective esophageal motility    Dyspnea on exertion 07/24/2020   Change in bowel habits 08/22/2018   Knee stiff 07/23/2018   Degeneration of lumbar intervertebral disc 02/04/2018   Scoliosis of lumbar spine 02/04/2018   Sinus bradycardia 07/27/2016   Cough    Essential hypertension 11/24/2014   Gonalgia 05/13/2013   OA (osteoarthritis) of knee 05/13/2013   Degenerative joint disease involving multiple joints 01/19/2013   Rheumatoid arthritis (HCC) 01/19/2013   Thyroid nodule 06/11/2012   Elevated total protein    Cyst of thyroid 10/29/2011   Hemorrhoids, internal 05/28/2011   Acid reflux 02/14/2011   Avitaminosis D 02/13/2011   Glaucoma 11/28/2010   HLD (hyperlipidemia) 12/14/2009   Internal and external bleeding hemorrhoids 12/14/2009   DIVERTICULOSIS, COLON 12/14/2009   Joint pain 12/14/2009   BACK PAIN, CHRONIC 12/14/2009   FIBROMYALGIA 12/14/2009   OSTEOPOROSIS 12/14/2009   Sleep apnea 12/14/2009   History of colonic polyps 12/14/2009   GASTRIC POLYP, HX OF 12/14/2009   Diaphragmatic hernia 10/04/2009   Depression 09/29/2009   REFLUX ESOPHAGITIS 09/29/2009   GERD 09/29/2009   Constipation 09/29/2009   DYSPHAGIA 09/29/2009    PCP: Harvest Forest, MD   REFERRING PROVIDER: Leonie Douglas, MD   REFERRING DIAG:  Neck pain after TCAR     THERAPY DIAG:  Cervicalgia  Muscle weakness (generalized)  Rationale for Evaluation and Treatment: Rehabilitation  ONSET  DATE: 07/29/2024  SUBJECTIVE:  SUBJECTIVE STATEMENT: Pt reports that she was sore after last visit and she does not want to do the UBE today.  EVAL: Patient reports to PT with history of fall in October following suspected CVA. She states that in October, they placed a stent in her R carotid artery. She states that the pain feels like she is "being strangled" especially with looking over her Right shoulder. She is ambulating with L immobilization boot and rollator d/t reported jones fracture related to her fall. She states that her baseline is walking cane. She states that she was supposed to have shoulder replacement in November. She is reconsidering if she wants to undergo surgery and the post-op rehab because of her age.    Hand dominance: Right  PERTINENT HISTORY:  Relevant PMHx includes anxiety, arthritis, bradycardia, carotid artery occlusion, depression, DM type II, fibromyalgia, glaucoma, hearing loss, HTN, CVA, carotid stenosis, scoliosis of lumbar spine  PAIN:  Are you having pain?  Yes: NPRS scale: 3-10/10 Pain location: R side of neck  Pain description: tightness Aggravating factors: looking over her right shoulder Relieving factors: cold compress  PRECAUTIONS: Fall  RED FLAGS: None     WEIGHT BEARING RESTRICTIONS: No  FALLS:  Has patient fallen in last 6 months? Yes. Number of falls see MOI; and a couple of close calls  LIVING ENVIRONMENT: Lives with: lives with their spouse Lives in: House/apartment Stairs:  Yes, two external steps with railing  Has following equipment at home: Dan Humphreys - 4 wheeled  OCCUPATION: retired  PLOF: Independent  PATIENT GOALS: To get rid of this uncomfortable feeling in my neck   NEXT MD VISIT: 03/10/2024  OBJECTIVE:  Note: Objective  measures were completed at Evaluation unless otherwise noted.  DIAGNOSTIC FINDINGS:  08/07/2023 CT angio head neck w wo cm   IMPRESSION: 1. A 70% stenosis of the proximal right ICA due to mixed density atherosclerosis. This is a likely source of the previously demonstrated right MCA territory embolic infarcts. 2. Severe stenosis of the right vertebral artery V1 segment and occlusion of the V4 segment, just proximal to the vertebrobasilar confluence. Right PICA remains patent. 3. 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  PATIENT SURVEYS:  NDI 28/50 = 56%   COGNITION: Overall cognitive status: Within functional limits for tasks assessed  SENSATION: Not tested  POSTURE: rounded shoulders and forward head   CERVICAL ROM:   Active ROM A/PROM (deg) eval  Flexion 50  Extension 10  Right lateral flexion 40  Left lateral flexion 30  Right rotation 50  Left rotation 30   (Blank rows = not tested)  UPPER EXTREMITY ROM:  Active ROM Right eval Left eval  Shoulder flexion    Shoulder extension    Shoulder abduction    Shoulder adduction    Shoulder extension    Shoulder internal rotation    Shoulder external rotation    Elbow flexion    Elbow extension    Wrist flexion    Wrist extension    Wrist ulnar deviation    Wrist radial deviation    Wrist pronation    Wrist supination     (Blank rows = not tested)  UPPER EXTREMITY MMT:  MMT Right eval Left eval  Shoulder flexion 2 2+  Shoulder extension    Shoulder abduction 2 2+  Shoulder adduction    Shoulder extension    Shoulder internal rotation    Shoulder external rotation    Middle trapezius  Lower trapezius    Elbow flexion    Elbow extension    Wrist flexion    Wrist extension    Wrist ulnar deviation    Wrist radial deviation    Wrist pronation    Wrist supination    Grip strength 20# 20#   (Blank rows = not tested)  CERVICAL SPECIAL TESTS:  Not assessed  at time of eval   FUNCTIONAL TESTS:  Not assessed at time of eval   TREATMENT DATE:  [facility] Adult PT Treatment:                                                DATE: 01/07/2024 Therapeutic Exercise: Chin tuck in supine - 2x10 - 5'' hold Gentle supine cervical rotation and side bent ROM with PT assist  Manual Therapy  Sub occipital release STM cervical paraspinals STM R UT Scar mobilization (bulk of time) for desensitization and improved mobility w/ cocoa butter      HOME EXERCISE PROGRAM: Access Code: [email or web address] URL: [email or web address] Date: 01/02/2024 Prepared by: Mauri Reading  Exercises - Seated Upper Trapezius Stretch  - 2 x daily - 7 x weekly - 2 sets - 30 sec hold - Gentle Levator Scapulae Stretch  - 2 x daily - 7 x weekly - 2 sets - 30 sec hold - Sternocleidomastoid Stretch  - 2 x daily - 7 x weekly - 2 sets - 30 sec hold - Seated Cervical Retraction Protraction AROM  - 1 x daily - 3-4 x weekly - 2 sets - 10 reps - 3 sec hold - Seated Scapular Retraction  - 1 x daily - 3-4 x weekly - 2 sets - 10 reps - 3 sec hold  ASSESSMENT:  CLINICAL IMPRESSION: 01/07/2024 Avagrace tolerated session well with no adverse reaction.  Concentrated bulk of session on desensitization and scar mobilization.  Pt does improve sensitivity over session with improved tolerance to direct contact of scar by end of session.  Direct contact is still quite uncomfortable.  She reports significant improvement in discomfort following session.  I recommended she frequently (multiple times a day) gently massage around the area slowly working toward direct contact; she agrees to plan.  EVAL: Mccall is a 83 y.o. female who was seen today for physical therapy evaluation and treatment for neck pain with mobility deficits. She is demonstrating decreased CS AROM, decrease UE AROM, decreased UE MMT scores, and diminished grip strength BIL. She has related pain and difficulty with looking over her right  shoulder and performing related ADLs/IADLs. She requires skilled PT services at this time to address relevant deficits and improve overall function.     OBJECTIVE IMPAIRMENTS: decreased activity tolerance, decreased ROM, decreased strength, postural dysfunction, and pain.   ACTIVITY LIMITATIONS: carrying, lifting, reach over head, and hygiene/grooming  PARTICIPATION LIMITATIONS: meal prep, cleaning, and community activity  PERSONAL FACTORS: Age, Time since onset of injury/illness/exacerbation, and 3+ comorbidities: Relevant PMHx includes anxiety, arthritis, bradycardia, carotid artery occlusion, depression, DM type II, fibromyalgia, glaucoma, hearing loss, HTN, CVA, carotid stenosis, scoliosis of lumbar spine  are also affecting patient's functional outcome.   REHAB POTENTIAL: Fair    CLINICAL DECISION MAKING: Evolving/moderate complexity  EVALUATION COMPLEXITY: Moderate   GOALS: Goals reviewed with patient? Yes  SHORT TERM GOALS = LONG TERM GOALS: Target date: 01/21/2024   Patient will report improved overall functional ability  with NDI score of 15/50 or less. Baseline: NDI 28/50 = 56% Goal status: INITIAL  2.  Patient will report no more than minimal pain or difficulty with looking over her R shoulder.  Baseline: moderate-to-severe pain  Goal status: INITIAL  3.  Patient will demonstrate improved grip strength to at least 25lbs bilaterally Baseline: 20lbs bilaterally Goal status: INITIAL  4.  Patient will be independent with initial home program for CS mobility and UQ strengthening.  Baseline: provided at f/u visit Goal status: INITIAL  5.  Patient will report 5/10 worst pain with daily activities over the past week.  Baseline: 10/10 worst pain  Goal status: INITIAL   PLAN:  PT FREQUENCY: 1-2x/week  PT DURATION: 4 weeks  PLANNED INTERVENTIONS: 97164- PT Re-evaluation, 97110-Therapeutic exercises, 97530- Therapeutic activity, 97112- Neuromuscular re-education, 97535-  Self Care, 14782- Manual therapy, and Patient/Family education  PLAN FOR NEXT SESSION: address CS mobility, gentle SCM stretch, UQ strengthening   Kimberlee Nearing Mariel Lukins PT  01/07/2024 11:36 AM

## 2024-01-09 ENCOUNTER — Ambulatory Visit

## 2024-01-09 DIAGNOSIS — M6281 Muscle weakness (generalized): Secondary | ICD-10-CM

## 2024-01-09 DIAGNOSIS — M542 Cervicalgia: Secondary | ICD-10-CM | POA: Diagnosis not present

## 2024-01-09 NOTE — Therapy (Signed)
 OUTPATIENT PHYSICAL THERAPY TREATMENT NOTE    Patient Name: [name] MRN: [id number] DOB:[birth year], 83 y.o., female Today's Date: 01/09/2024  END OF SESSION:  PT End of Session - 01/09/24 1119     Visit Number 5    Number of Visits 9    Date for PT Re-Evaluation 01/21/24    Authorization Type MCR    Progress Note Due on Visit 10    PT Start Time 1045    PT Stop Time 1123    PT Time Calculation (min) 38 min    Activity Tolerance Patient tolerated treatment well;Patient limited by pain    Behavior During Therapy WFL for tasks assessed/performed               Past Medical History:  Diagnosis Date   Allergy    Anal or rectal pain    sometimes   Anemia    hx of during pregnancy   Anxiety    Arthritis    Asthma    hx of   Bradycardia    " I KNOW I HAVE BRADYCARDIA ESPECIALLY WHEN I SLEEP"    Carotid artery occlusion    Cataract 2021   bilateral eyes   Chronic back pain    Degenerative joint disease    osteo   Depression    Diabetes mellitus without complication (HCC)    DM type II   Diverticulosis 2003   Dysrhythmia    hx of  due to eye drop and also low heart rate 40's per pt. Dr. Allyson Sabal follows   Elevated total protein    Esophageal dysmotility    Fibromyalgia    GERD (gastroesophageal reflux disease)    subsequent Nissen Fundoplication   Glaucoma    Hearing loss    Heart murmur    "was told she had a heart murmur"   Hemorrhoids    Hiatal hernia 11/08/2009   Hx of adenomatous colonic polyps 07/02/2002   Hypercalcemia    Hyperlipidemia    Hyperlipidemia    Hypertension    Implantable loop recorder present 11/28/2021   Nausea    Osteoporosis    PONV (postoperative nausea and vomiting)    Rectal bleeding    from hemorrhoids.     Sleep apnea    DOES USE CPAP    Thrombocytopenia (HCC)    Varicose veins of left lower extremity    Past Surgical History:  Procedure Laterality Date   CARDIAC CATHETERIZATION  03/14/1992   Normal cardiac cath.  Normal LV function.   CARDIOVASCULAR STRESS TEST  01/22/2011   No scintigraphic evidence of inducible ischemia.   CAROTID DOPPLER  03/31/2007   Bilateral ICAs - no evidence of significant diameter reduction, dissectin, tortuosity, FMD, or any other vascular abnormality.   CATARACT EXTRACTION, BILATERAL     ESOPHAGEAL MANOMETRY N/A 11/14/2015   Procedure: ESOPHAGEAL MANOMETRY (EM);  Surgeon: Napoleon Form, MD;  Location: [facility];  Service: Endoscopy;  Laterality: N/A;   ESOPHAGEAL MANOMETRY N/A 01/18/2021   Procedure: ESOPHAGEAL MANOMETRY (EM);  Surgeon: Napoleon Form, MD;  Location: [facility];  Service: Endoscopy;  Laterality: N/A;   ESOPHAGOGASTRODUODENOSCOPY N/A 03/08/2021   Procedure: ESOPHAGOGASTRODUODENOSCOPY (EGD);  Surgeon: Corliss Skains, MD;  Location: [facility] OR;  Service: Thoracic;  Laterality: N/A;   EYE SURGERY     bilateral cataracts; bilateral stents   GASTRIC RESECTION  2009   GLAUCOMA SURGERY     JOINT REPLACEMENT     right knee Dr. Lequita Halt 06-23-18  KNEE SURGERY Bilateral    NISSEN FUNDOPLICATION  2000   with subsequent takedown in 2009   TOTAL KNEE ARTHROPLASTY Left 06/10/2017   Procedure: LEFT  TOTAL KNEE ARTHROPLASTY;  Surgeon: Ollen Gross, MD;  Location: [facility];  Service: Orthopedics;  Laterality: Left;  Adductor Block   TOTAL KNEE ARTHROPLASTY Right 06/23/2018   Procedure: RIGHT TOTAL KNEE ARTHROPLASTY;  Surgeon: Ollen Gross, MD;  Location: [facility];  Service: Orthopedics;  Laterality: Right;   TRANSCAROTID ARTERY REVASCULARIZATION  Right 08/09/2023   Procedure: TRANSCAROTID ARTERY REVASCULARIZATION;  Surgeon: Leonie Douglas, MD;  Location: [facility] OR;  Service: Vascular;  Laterality: Right;   TRANSTHORACIC ECHOCARDIOGRAM  12/21/2010   EF 60%, moderate LVH,    TUBAL LIGATION     XI ROBOTIC ASSISTED HIATAL HERNIA REPAIR N/A 03/08/2021   Procedure: XI ROBOTIC ASSISTED LAPAROSCOPY WITH LYSIS OF ADHESIONS;  Surgeon: Corliss Skains, MD;  Location: [facility] OR;   Service: Thoracic;  Laterality: N/A;   Patient Active Problem List   Diagnosis Date Noted   Closed nondisplaced fracture of fifth metatarsal bone of left foot 08/07/2023   Stroke (HCC) 08/07/2023   Carotid stenosis, asymptomatic, right 08/07/2023   CVA (cerebral vascular accident) (HCC) 08/06/2023   Syncope 10/22/2022   Acute renal failure superimposed on stage 3a chronic kidney disease (HCC) 05/22/2022   AKI (acute kidney injury) (HCC) 05/21/2022   Hiatal hernia 03/08/2021   Heartburn    Ineffective esophageal motility    Dyspnea on exertion 07/24/2020   Change in bowel habits 08/22/2018   Knee stiff 07/23/2018   Degeneration of lumbar intervertebral disc 02/04/2018   Scoliosis of lumbar spine 02/04/2018   Sinus bradycardia 07/27/2016   Cough    Essential hypertension 11/24/2014   Gonalgia 05/13/2013   OA (osteoarthritis) of knee 05/13/2013   Degenerative joint disease involving multiple joints 01/19/2013   Rheumatoid arthritis (HCC) 01/19/2013   Thyroid nodule 06/11/2012   Elevated total protein    Cyst of thyroid 10/29/2011   Hemorrhoids, internal 05/28/2011   Acid reflux 02/14/2011   Avitaminosis D 02/13/2011   Glaucoma 11/28/2010   HLD (hyperlipidemia) 12/14/2009   Internal and external bleeding hemorrhoids 12/14/2009   DIVERTICULOSIS, COLON 12/14/2009   Joint pain 12/14/2009   BACK PAIN, CHRONIC 12/14/2009   FIBROMYALGIA 12/14/2009   OSTEOPOROSIS 12/14/2009   Sleep apnea 12/14/2009   History of colonic polyps 12/14/2009   GASTRIC POLYP, HX OF 12/14/2009   Diaphragmatic hernia 10/04/2009   Depression 09/29/2009   REFLUX ESOPHAGITIS 09/29/2009   GERD 09/29/2009   Constipation 09/29/2009   DYSPHAGIA 09/29/2009    PCP: Harvest Forest, MD   REFERRING PROVIDER: Leonie Douglas, MD   REFERRING DIAG:  Neck pain after TCAR     THERAPY DIAG:  Cervicalgia  Muscle weakness (generalized)  Rationale for Evaluation and Treatment: Rehabilitation  ONSET  DATE: 07/29/2024  SUBJECTIVE:  SUBJECTIVE STATEMENT: Patient reports that she does not want to return to [facility] d/t exacerbation of shoulder pain. She states that she hasn't been resting well lately. She has been working on scar mobilization  EVAL: Patient reports to PT with history of fall in October following suspected CVA. She states that in October, they placed a stent in her R carotid artery. She states that the pain feels like she is "being strangled" especially with looking over her Right shoulder. She is ambulating with L immobilization boot and rollator d/t reported Amil Moseman fracture related to her fall. She states that her baseline is walking cane. She states that she was supposed to have shoulder replacement in November. She is reconsidering if she wants to undergo surgery and the post-op rehab because of her age.    Hand dominance: Right  PERTINENT HISTORY:  Relevant PMHx includes anxiety, arthritis, bradycardia, carotid artery occlusion, depression, DM type II, fibromyalgia, glaucoma, hearing loss, HTN, CVA, carotid stenosis, scoliosis of lumbar spine  PAIN:  Are you having pain?  Yes: NPRS scale: 3-10/10 Pain location: R side of neck  Pain description: tightness Aggravating factors: looking over her right shoulder Relieving factors: cold compress  PRECAUTIONS: Fall  RED FLAGS: None     WEIGHT BEARING RESTRICTIONS: No  FALLS:  Has patient fallen in last 6 months? Yes. Number of falls see MOI; and a couple of close calls  LIVING ENVIRONMENT: Lives with: lives with their spouse Lives in: House/apartment Stairs:  Yes, two external steps with railing  Has following equipment at home: Dan Humphreys - 4 wheeled  OCCUPATION: retired  PLOF: Independent  PATIENT GOALS: To get rid of  this uncomfortable feeling in my neck   NEXT MD VISIT: 03/10/2024  OBJECTIVE:  Note: Objective measures were completed at Evaluation unless otherwise noted.  DIAGNOSTIC FINDINGS:  08/07/2023 CT angio head neck w wo cm   IMPRESSION: 1. A 70% stenosis of the proximal right ICA due to mixed density atherosclerosis. This is a likely source of the previously demonstrated right MCA territory embolic infarcts. 2. Severe stenosis of the right vertebral artery V1 segment and occlusion of the V4 segment, just proximal to the vertebrobasilar confluence. Right PICA remains patent. 3. 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  PATIENT SURVEYS:  NDI 28/50 = 56%   COGNITION: Overall cognitive status: Within functional limits for tasks assessed  SENSATION: Not tested  POSTURE: rounded shoulders and forward head   CERVICAL ROM:   Active ROM A/PROM (deg) eval 01/09/2024 AROM  Flexion 50 65  Extension 10 30  Right lateral flexion 40 40  Left lateral flexion 30 40  Right rotation 50 50  Left rotation 30 50   (Blank rows = not tested)  UPPER EXTREMITY ROM:  Active ROM Right eval Left eval  Shoulder flexion    Shoulder extension    Shoulder abduction    Shoulder adduction    Shoulder extension    Shoulder internal rotation    Shoulder external rotation    Elbow flexion    Elbow extension    Wrist flexion    Wrist extension    Wrist ulnar deviation    Wrist radial deviation    Wrist pronation    Wrist supination     (Blank rows = not tested)  UPPER EXTREMITY MMT:  MMT Right eval Left eval  Shoulder flexion 2 2+  Shoulder extension    Shoulder abduction 2 2+  Shoulder adduction  Shoulder extension    Shoulder internal rotation    Shoulder external rotation    Middle trapezius    Lower trapezius    Elbow flexion    Elbow extension    Wrist flexion    Wrist extension    Wrist ulnar deviation    Wrist radial deviation     Wrist pronation    Wrist supination    Grip strength 20# 20#   (Blank rows = not tested)  CERVICAL SPECIAL TESTS:  Not assessed at time of eval   FUNCTIONAL TESTS:  Not assessed at time of eval   TREATMENT DATE:   [facility] Adult PT Treatment:                                                DATE: 01/09/2024 Therapeutic Exercise: Chin tuck in seated- 2x10  Seated CS rotation AROM x 10 each side  Seated R SCM stretch, 3 x 15 sec  Standing pull downs with green TB x 12   Manual Therapy Sub occipital release STM cervical paraspinals STM R UT Scar mobilization (bulk of time) for desensitization and improved mobility w/ cocoa butter  [facility] Adult PT Treatment:                                                DATE: 01/07/2024 Therapeutic Exercise: Chin tuck in supine - 2x10 - 5'' hold Gentle supine cervical rotation and side bent ROM with PT assist  Manual Therapy  Sub occipital release STM cervical paraspinals STM R UT Scar mobilization (bulk of time) for desensitization and improved mobility w/ cocoa butter      HOME EXERCISE PROGRAM: Access Code: [email or web address] URL: [email or web address] Date: 01/02/2024 Prepared by: Mauri Reading  Exercises - Seated Upper Trapezius Stretch  - 2 x daily - 7 x weekly - 2 sets - 30 sec hold - Gentle Levator Scapulae Stretch  - 2 x daily - 7 x weekly - 2 sets - 30 sec hold - Sternocleidomastoid Stretch  - 2 x daily - 7 x weekly - 2 sets - 30 sec hold - Seated Cervical Retraction Protraction AROM  - 1 x daily - 3-4 x weekly - 2 sets - 10 reps - 3 sec hold - Seated Scapular Retraction  - 1 x daily - 3-4 x weekly - 2 sets - 10 reps - 3 sec hold  ASSESSMENT:  CLINICAL IMPRESSION: 01/09/2024 Spent time today addressing scar sensitivity and scar mobilization. Encouraged patient to work on Acupuncturist with soft materials for 30 sec bouts until next visit. At next visit, we will continue with to address this as tolerated. Spent  some time today assess CS AROM and dicussing improved mobility today. We will include more postural strengthening activities at future visits.   EVAL: Atasha is a 83 y.o. female who was seen today for physical therapy evaluation and treatment for neck pain with mobility deficits. She is demonstrating decreased CS AROM, decrease UE AROM, decreased UE MMT scores, and diminished grip strength BIL. She has related pain and difficulty with looking over her right shoulder and performing related ADLs/IADLs. She requires skilled PT services at this time to address relevant deficits and improve overall function.  OBJECTIVE IMPAIRMENTS: decreased activity tolerance, decreased ROM, decreased strength, postural dysfunction, and pain.   ACTIVITY LIMITATIONS: carrying, lifting, reach over head, and hygiene/grooming  PARTICIPATION LIMITATIONS: meal prep, cleaning, and community activity  PERSONAL FACTORS: Age, Time since onset of injury/illness/exacerbation, and 3+ comorbidities: Relevant PMHx includes anxiety, arthritis, bradycardia, carotid artery occlusion, depression, DM type II, fibromyalgia, glaucoma, hearing loss, HTN, CVA, carotid stenosis, scoliosis of lumbar spine  are also affecting patient's functional outcome.   REHAB POTENTIAL: Fair    CLINICAL DECISION MAKING: Evolving/moderate complexity  EVALUATION COMPLEXITY: Moderate   GOALS: Goals reviewed with patient? Yes  SHORT TERM GOALS = LONG TERM GOALS: Target date: 01/21/2024   Patient will report improved overall functional ability with NDI score of 15/50 or less. Baseline: NDI 28/50 = 56% Goal status: INITIAL  2.  Patient will report no more than minimal pain or difficulty with looking over her R shoulder.  Baseline: moderate-to-severe pain  Goal status: INITIAL  3.  Patient will demonstrate improved grip strength to at least 25lbs bilaterally Baseline: 20lbs bilaterally Goal status: INITIAL  4.  Patient will be independent with  initial home program for CS mobility and UQ strengthening.  Baseline: provided at f/u visit Goal status: INITIAL  5.  Patient will report 5/10 worst pain with daily activities over the past week.  Baseline: 10/10 worst pain  Goal status: INITIAL   PLAN:  PT FREQUENCY: 1-2x/week  PT DURATION: 4 weeks  PLANNED INTERVENTIONS: 97164- PT Re-evaluation, 97110-Therapeutic exercises, 97530- Therapeutic activity, [id number]- Neuromuscular re-education, 97535- Self Care, 16109- Manual therapy, and Patient/Family education  PLAN FOR NEXT SESSION: address CS mobility, gentle SCM stretch, UQ strengthening   Mauri Reading PT  01/09/2024 12:05 PM

## 2024-01-10 ENCOUNTER — Ambulatory Visit: Payer: Medicare Other | Attending: Cardiology | Admitting: Cardiology

## 2024-01-10 ENCOUNTER — Encounter: Payer: Self-pay | Admitting: Cardiology

## 2024-01-10 VITALS — BP 130/80 | HR 66 | Ht 70.0 in | Wt 220.0 lb

## 2024-01-10 DIAGNOSIS — R001 Bradycardia, unspecified: Secondary | ICD-10-CM | POA: Diagnosis not present

## 2024-01-10 DIAGNOSIS — G4733 Obstructive sleep apnea (adult) (pediatric): Secondary | ICD-10-CM | POA: Diagnosis not present

## 2024-01-10 DIAGNOSIS — Z8673 Personal history of transient ischemic attack (TIA), and cerebral infarction without residual deficits: Secondary | ICD-10-CM

## 2024-01-10 DIAGNOSIS — I10 Essential (primary) hypertension: Secondary | ICD-10-CM | POA: Diagnosis not present

## 2024-01-10 NOTE — Patient Instructions (Signed)
 Medication Instructions:  Your physician recommends that you continue on your current medications as directed. Please refer to the Current Medication list given to you today.  *If you need a refill on your cardiac medications before your next appointment, please call your pharmacy*  Follow-Up: At [facility], you and your health needs are our priority.  As part of our continuing mission to provide you with exceptional heart care, our providers are all part of one team.  This team includes your primary Cardiologist (physician) and Advanced Practice Providers or APPs (Physician Assistants and Nurse Practitioners) who all work together to provide you with the care you need, when you need it.  Your next appointment:   4 month(s)  Provider:   Thomasene Ripple, DO       Other Instructions       1st Floor: - Lobby - Registration  - Pharmacy  - Lab - Cafe  2nd Floor: - PV Lab - Diagnostic Testing (echo, CT, nuclear med)  3rd Floor: - Vacant  4th Floor: - TCTS (cardiothoracic surgery) - AFib Clinic - Structural Heart Clinic - Vascular Surgery  - Vascular Ultrasound  5th Floor: - [facility] Card[facility] (general and EP) - Clinical Pharmacy for coumadin, hypertension, lipid, weight-loss medications, and med management appointments    Valet parking services will be available as well.
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
## 2024-01-14 ENCOUNTER — Ambulatory Visit: Attending: Vascular Surgery

## 2024-01-14 DIAGNOSIS — M25572 Pain in left ankle and joints of left foot: Secondary | ICD-10-CM | POA: Insufficient documentation

## 2024-01-14 DIAGNOSIS — M25552 Pain in left hip: Secondary | ICD-10-CM | POA: Diagnosis not present

## 2024-01-14 DIAGNOSIS — M6281 Muscle weakness (generalized): Secondary | ICD-10-CM | POA: Insufficient documentation

## 2024-01-14 DIAGNOSIS — M542 Cervicalgia: Secondary | ICD-10-CM | POA: Diagnosis not present

## 2024-01-14 DIAGNOSIS — R262 Difficulty in walking, not elsewhere classified: Secondary | ICD-10-CM | POA: Insufficient documentation

## 2024-01-14 DIAGNOSIS — M5416 Radiculopathy, lumbar region: Secondary | ICD-10-CM | POA: Insufficient documentation

## 2024-01-14 DIAGNOSIS — M25551 Pain in right hip: Secondary | ICD-10-CM | POA: Diagnosis not present

## 2024-01-14 NOTE — Therapy (Signed)
 OUTPATIENT PHYSICAL THERAPY TREATMENT NOTE    Patient Name: [name] MRN: [id number] DOB:1941/01/24, 83 y.o., female Today's Date: 01/14/2024  END OF SESSION:  PT End of Session - 01/14/24 1044     Visit Number 6    Number of Visits 9    Date for PT Re-Evaluation 01/21/24    Authorization Type MCR    Progress Note Due on Visit 10    PT Start Time 1045    PT Stop Time 1125    PT Time Calculation (min) 40 min    Activity Tolerance Patient tolerated treatment well;Patient limited by pain               Past Medical History:  Diagnosis Date   Allergy    Anal or rectal pain    sometimes   Anemia    hx of during pregnancy   Anxiety    Arthritis    Asthma    hx of   Bradycardia    " I KNOW I HAVE BRADYCARDIA ESPECIALLY WHEN I SLEEP"    Carotid artery occlusion    Cataract 2021   bilateral eyes   Chronic back pain    Degenerative joint disease    osteo   Depression    Diabetes mellitus without complication (HCC)    DM type II   Diverticulosis 2003   Dysrhythmia    hx of  due to eye drop and also low heart rate 40's per pt. Dr. Allyson Sabal follows   Elevated total protein    Esophageal dysmotility    Fibromyalgia    GERD (gastroesophageal reflux disease)    subsequent Nissen Fundoplication   Glaucoma    Hearing loss    Heart murmur    "was told she had a heart murmur"   Hemorrhoids    Hiatal hernia 11/08/2009   Hx of adenomatous colonic polyps 07/02/2002   Hypercalcemia    Hyperlipidemia    Hyperlipidemia    Hypertension    Implantable loop recorder present 11/28/2021   Nausea    Osteoporosis    PONV (postoperative nausea and vomiting)    Rectal bleeding    from hemorrhoids.     Sleep apnea    DOES USE CPAP    Thrombocytopenia (HCC)    Varicose veins of left lower extremity    Past Surgical History:  Procedure Laterality Date   CARDIAC CATHETERIZATION  03/14/1992   Normal cardiac cath. Normal LV function.   CARDIOVASCULAR STRESS TEST  01/22/2011    No scintigraphic evidence of inducible ischemia.   CAROTID DOPPLER  03/31/2007   Bilateral ICAs - no evidence of significant diameter reduction, dissectin, tortuosity, FMD, or any other vascular abnormality.   CATARACT EXTRACTION, BILATERAL     ESOPHAGEAL MANOMETRY N/A 11/14/2015   Procedure: ESOPHAGEAL MANOMETRY (EM);  Surgeon: Napoleon Form, MD;  Location: [facility];  Service: Endoscopy;  Laterality: N/A;   ESOPHAGEAL MANOMETRY N/A 01/18/2021   Procedure: ESOPHAGEAL MANOMETRY (EM);  Surgeon: Napoleon Form, MD;  Location: [facility];  Service: Endoscopy;  Laterality: N/A;   ESOPHAGOGASTRODUODENOSCOPY N/A 03/08/2021   Procedure: ESOPHAGOGASTRODUODENOSCOPY (EGD);  Surgeon: Corliss Skains, MD;  Location: [facility] OR;  Service: Thoracic;  Laterality: N/A;   EYE SURGERY     bilateral cataracts; bilateral stents   GASTRIC RESECTION  2009   GLAUCOMA SURGERY     JOINT REPLACEMENT     right knee Dr. Lequita Halt 06-23-18   KNEE SURGERY Bilateral    NISSEN FUNDOPLICATION  2000   with subsequent takedown in 2009   TOTAL KNEE ARTHROPLASTY Left 06/10/2017   Procedure: LEFT  TOTAL KNEE ARTHROPLASTY;  Surgeon: Ollen Gross, MD;  Location: [facility];  Service: Orthopedics;  Laterality: Left;  Adductor Block   TOTAL KNEE ARTHROPLASTY Right 06/23/2018   Procedure: RIGHT TOTAL KNEE ARTHROPLASTY;  Surgeon: Ollen Gross, MD;  Location: [facility];  Service: Orthopedics;  Laterality: Right;   TRANSCAROTID ARTERY REVASCULARIZATION  Right 08/09/2023   Procedure: TRANSCAROTID ARTERY REVASCULARIZATION;  Surgeon: Leonie Douglas, MD;  Location: [facility] OR;  Service: Vascular;  Laterality: Right;   TRANSTHORACIC ECHOCARDIOGRAM  12/21/2010   EF 60%, moderate LVH,    TUBAL LIGATION     XI ROBOTIC ASSISTED HIATAL HERNIA REPAIR N/A 03/08/2021   Procedure: XI ROBOTIC ASSISTED LAPAROSCOPY WITH LYSIS OF ADHESIONS;  Surgeon: Corliss Skains, MD;  Location: [facility] OR;  Service: Thoracic;  Laterality: N/A;   Patient Active  Problem List   Diagnosis Date Noted   Closed nondisplaced fracture of fifth metatarsal bone of left foot 08/07/2023   Stroke (HCC) 08/07/2023   Carotid stenosis, asymptomatic, right 08/07/2023   CVA (cerebral vascular accident) (HCC) 08/06/2023   Syncope 10/22/2022   Acute renal failure superimposed on stage 3a chronic kidney disease (HCC) 05/22/2022   AKI (acute kidney injury) (HCC) 05/21/2022   Hiatal hernia 03/08/2021   Heartburn    Ineffective esophageal motility    Dyspnea on exertion 07/24/2020   Change in bowel habits 08/22/2018   Knee stiff 07/23/2018   Degeneration of lumbar intervertebral disc 02/04/2018   Scoliosis of lumbar spine 02/04/2018   Sinus bradycardia 07/27/2016   Cough    Essential hypertension 11/24/2014   Gonalgia 05/13/2013   OA (osteoarthritis) of knee 05/13/2013   Degenerative joint disease involving multiple joints 01/19/2013   Rheumatoid arthritis (HCC) 01/19/2013   Thyroid nodule 06/11/2012   Elevated total protein    Cyst of thyroid 10/29/2011   Hemorrhoids, internal 05/28/2011   Acid reflux 02/14/2011   Avitaminosis D 02/13/2011   Glaucoma 11/28/2010   HLD (hyperlipidemia) 12/14/2009   Internal and external bleeding hemorrhoids 12/14/2009   DIVERTICULOSIS, COLON 12/14/2009   Joint pain 12/14/2009   BACK PAIN, CHRONIC 12/14/2009   FIBROMYALGIA 12/14/2009   OSTEOPOROSIS 12/14/2009   Sleep apnea 12/14/2009   History of colonic polyps 12/14/2009   GASTRIC POLYP, HX OF 12/14/2009   Diaphragmatic hernia 10/04/2009   Depression 09/29/2009   REFLUX ESOPHAGITIS 09/29/2009   GERD 09/29/2009   Constipation 09/29/2009   DYSPHAGIA 09/29/2009    PCP: Harvest [place], MD   REFERRING PROVIDER: Leonie Douglas, MD   REFERRING DIAG:  Neck pain after TCAR     THERAPY DIAG:  Cervicalgia  Muscle weakness (generalized)  Rationale for Evaluation and Treatment: Rehabilitation  ONSET DATE: 07/29/2024  SUBJECTIVE:  SUBJECTIVE STATEMENT: Patient states that she has been using desensitization strategies discussed at last visit. She reports feeling some improvement in scar sensitivity.   EVAL: Patient reports to PT with history of fall in October following suspected CVA. She states that in October, they placed a stent in her R carotid artery. She states that the pain feels like she is "being strangled" especially with looking over her Right shoulder. She is ambulating with L immobilization boot and rollator d/t reported Evangelene Vora fracture related to her fall. She states that her baseline is walking cane. She states that she was supposed to have shoulder replacement in November. She is reconsidering if she wants to undergo surgery and the post-op rehab because of her age.    Hand dominance: Right  PERTINENT HISTORY:  Relevant PMHx includes anxiety, arthritis, bradycardia, carotid artery occlusion, depression, DM type II, fibromyalgia, glaucoma, hearing loss, HTN, CVA, carotid stenosis, scoliosis of lumbar spine  PAIN:  Are you having pain?  Yes: NPRS scale: 3-10/10 Pain location: R side of neck  Pain description: tightness Aggravating factors: looking over her right shoulder Relieving factors: cold compress  PRECAUTIONS: Fall  RED FLAGS: None     WEIGHT BEARING RESTRICTIONS: No  FALLS:  Has patient fallen in last 6 months? Yes. Number of falls see MOI; and a couple of close calls  LIVING ENVIRONMENT: Lives with: lives with their spouse Lives in: House/apartment Stairs:  Yes, two external steps with railing  Has following equipment at home: Dan Humphreys - 4 wheeled  OCCUPATION: retired  PLOF: Independent  PATIENT GOALS: To get rid of this uncomfortable feeling in my neck   NEXT MD VISIT: 03/10/2024  OBJECTIVE:  Note:  Objective measures were completed at Evaluation unless otherwise noted.  DIAGNOSTIC FINDINGS:  08/07/2023 CT angio head neck w wo cm   IMPRESSION: 1. A 70% stenosis of the proximal right ICA due to mixed density atherosclerosis. This is a likely source of the previously demonstrated right MCA territory embolic infarcts. 2. Severe stenosis of the right vertebral artery V1 segment and occlusion of the V4 segment, just proximal to the vertebrobasilar confluence. Right PICA remains patent. 3. 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  PATIENT SURVEYS:  NDI 28/50 = 56%   COGNITION: Overall cognitive status: Within functional limits for tasks assessed  SENSATION: Not tested  POSTURE: rounded shoulders and forward head   CERVICAL ROM:   Active ROM A/PROM (deg) eval 01/09/2024 AROM  Flexion 50 65  Extension 10 30  Right lateral flexion 40 40  Left lateral flexion 30 40  Right rotation 50 50  Left rotation 30 50   (Blank rows = not tested)  UPPER EXTREMITY ROM:  Active ROM Right eval Left eval  Shoulder flexion    Shoulder extension    Shoulder abduction    Shoulder adduction    Shoulder extension    Shoulder internal rotation    Shoulder external rotation    Elbow flexion    Elbow extension    Wrist flexion    Wrist extension    Wrist ulnar deviation    Wrist radial deviation    Wrist pronation    Wrist supination     (Blank rows = not tested)  UPPER EXTREMITY MMT:  MMT Right eval Left eval  Shoulder flexion 2 2+  Shoulder extension    Shoulder abduction 2 2+  Shoulder adduction    Shoulder extension    Shoulder internal rotation  Shoulder external rotation    Middle trapezius    Lower trapezius    Elbow flexion    Elbow extension    Wrist flexion    Wrist extension    Wrist ulnar deviation    Wrist radial deviation    Wrist pronation    Wrist supination    Grip strength 20# 20#   (Blank rows = not  tested)  CERVICAL SPECIAL TESTS:  Not assessed at time of eval   FUNCTIONAL TESTS:  Not assessed at time of eval   TREATMENT DATE:    [facility] Adult PT Treatment:                                                DATE: 01/14/2024  Therapeutic Exercise:  Standing pull downs with green TB, 2 x 10  Standing rows with green TB, 2 x 10  R SCM stretches, 2 x 10   Neuromuscular Re-education  Desensitization techniques using guaze for light to medium pressure, washcloth with light to medium pressure  Manual Therapy:  Scar mobilization for improved mobility    [facility] Adult PT Treatment:                                                DATE: 01/09/2024 Therapeutic Exercise: Chin tuck in seated- 2x10  Seated CS rotation AROM x 10 each side  Seated R SCM stretch, 3 x 15 sec  Standing pull downs with green TB x 12   Manual Therapy Sub occipital release STM cervical paraspinals STM R UT Scar mobilization (bulk of time) for desensitization and improved mobility w/ cocoa butter  [facility] Adult PT Treatment:                                                DATE: 01/07/2024 Therapeutic Exercise: Chin tuck in supine - 2x10 - 5'' hold Gentle supine cervical rotation and side bent ROM with PT assist  Manual Therapy  Sub occipital release STM cervical paraspinals STM R UT Scar mobilization (bulk of time) for desensitization and improved mobility w/ cocoa butter      HOME EXERCISE PROGRAM: Access Code: [email or web address] URL: [email or web address] Date: 01/02/2024 Prepared by: Mauri Reading  Exercises - Seated Upper Trapezius Stretch  - 2 x daily - 7 x weekly - 2 sets - 30 sec hold - Gentle Levator Scapulae Stretch  - 2 x daily - 7 x weekly - 2 sets - 30 sec hold - Sternocleidomastoid Stretch  - 2 x daily - 7 x weekly - 2 sets - 30 sec hold - Seated Cervical Retraction Protraction AROM  - 1 x daily - 3-4 x weekly - 2 sets - 10 reps - 3 sec hold - Seated Scapular Retraction  - 1 x daily -  3-4 x weekly - 2 sets - 10 reps - 3 sec hold  ASSESSMENT:  CLINICAL IMPRESSION: 01/14/2024 Aayra continues to respond well to current POC. She is demonstrating decreased sensitivity to palpation with scar mobility. She does not wince more than twice and her respiration rate remains fairly steady. Patient is encouraged  to continue working on densitivation techniques at home and was provided written education to help with this. We will continue to progress postural strengthening activities at future visits.   EVAL: Bryttney is a 83 y.o. female who was seen today for physical therapy evaluation and treatment for neck pain with mobility deficits. She is demonstrating decreased CS AROM, decrease UE AROM, decreased UE MMT scores, and diminished grip strength BIL. She has related pain and difficulty with looking over her right shoulder and performing related ADLs/IADLs. She requires skilled PT services at this time to address relevant deficits and improve overall function.     OBJECTIVE IMPAIRMENTS: decreased activity tolerance, decreased ROM, decreased strength, postural dysfunction, and pain.   ACTIVITY LIMITATIONS: carrying, lifting, reach over head, and hygiene/grooming  PARTICIPATION LIMITATIONS: meal prep, cleaning, and community activity  PERSONAL FACTORS: Age, Time since onset of injury/illness/exacerbation, and 3+ comorbidities: Relevant PMHx includes anxiety, arthritis, bradycardia, carotid artery occlusion, depression, DM type II, fibromyalgia, glaucoma, hearing loss, HTN, CVA, carotid stenosis, scoliosis of lumbar spine  are also affecting patient's functional outcome.   REHAB POTENTIAL: Fair    CLINICAL DECISION MAKING: Evolving/moderate complexity  EVALUATION COMPLEXITY: Moderate   GOALS: Goals reviewed with patient? Yes  SHORT TERM GOALS = LONG TERM GOALS: Target date: 01/21/2024   Patient will report improved overall functional ability with NDI score of 15/50 or less. Baseline: NDI  28/50 = 56% Goal status: INITIAL  2.  Patient will report no more than minimal pain or difficulty with looking over her R shoulder.  Baseline: moderate-to-severe pain  Goal status: INITIAL  3.  Patient will demonstrate improved grip strength to at least 25lbs bilaterally Baseline: 20lbs bilaterally Goal status: INITIAL  4.  Patient will be independent with initial home program for CS mobility and UQ strengthening.  Baseline: provided at f/u visit Goal status: INITIAL  5.  Patient will report 5/10 worst pain with daily activities over the past week.  Baseline: 10/10 worst pain  Goal status: INITIAL   PLAN:  PT FREQUENCY: 1-2x/week  PT DURATION: 4 weeks  PLANNED INTERVENTIONS: [id number]- PT Re-evaluation, 97110-Therapeutic exercises, 97530- Therapeutic activity, [id number]- Neuromuscular re-education, 97535- Self Care, [id number]- Manual therapy, and Patient/Family education  PLAN FOR NEXT SESSION: address CS mobility, gentle SCM stretch, UQ strengthening   Mauri Reading, PT, DPT  01/14/2024 3:11 PM

## 2024-01-16 ENCOUNTER — Ambulatory Visit: Admitting: Physical Therapy

## 2024-01-17 DIAGNOSIS — E1165 Type 2 diabetes mellitus with hyperglycemia: Secondary | ICD-10-CM | POA: Diagnosis not present

## 2024-01-17 DIAGNOSIS — E6609 Other obesity due to excess calories: Secondary | ICD-10-CM | POA: Diagnosis not present

## 2024-01-17 DIAGNOSIS — I455 Other specified heart block: Secondary | ICD-10-CM | POA: Diagnosis not present

## 2024-01-17 DIAGNOSIS — R0609 Other forms of dyspnea: Secondary | ICD-10-CM | POA: Diagnosis not present

## 2024-01-17 DIAGNOSIS — R251 Tremor, unspecified: Secondary | ICD-10-CM | POA: Diagnosis not present

## 2024-01-17 DIAGNOSIS — L905 Scar conditions and fibrosis of skin: Secondary | ICD-10-CM | POA: Diagnosis not present

## 2024-01-17 DIAGNOSIS — Z6831 Body mass index (BMI) 31.0-31.9, adult: Secondary | ICD-10-CM | POA: Diagnosis not present

## 2024-01-17 DIAGNOSIS — N1831 Chronic kidney disease, stage 3a: Secondary | ICD-10-CM | POA: Diagnosis not present

## 2024-01-17 DIAGNOSIS — R55 Syncope and collapse: Secondary | ICD-10-CM | POA: Diagnosis not present

## 2024-01-17 DIAGNOSIS — R52 Pain, unspecified: Secondary | ICD-10-CM | POA: Diagnosis not present

## 2024-01-17 DIAGNOSIS — I10 Essential (primary) hypertension: Secondary | ICD-10-CM | POA: Diagnosis not present

## 2024-01-17 DIAGNOSIS — F321 Major depressive disorder, single episode, moderate: Secondary | ICD-10-CM | POA: Diagnosis not present

## 2024-01-18 ENCOUNTER — Other Ambulatory Visit: Payer: Self-pay | Admitting: Cardiology

## 2024-01-21 ENCOUNTER — Ambulatory Visit

## 2024-01-21 DIAGNOSIS — R262 Difficulty in walking, not elsewhere classified: Secondary | ICD-10-CM | POA: Diagnosis not present

## 2024-01-21 DIAGNOSIS — M6281 Muscle weakness (generalized): Secondary | ICD-10-CM | POA: Diagnosis not present

## 2024-01-21 DIAGNOSIS — M25572 Pain in left ankle and joints of left foot: Secondary | ICD-10-CM | POA: Diagnosis not present

## 2024-01-21 DIAGNOSIS — M25551 Pain in right hip: Secondary | ICD-10-CM | POA: Diagnosis not present

## 2024-01-21 DIAGNOSIS — M542 Cervicalgia: Secondary | ICD-10-CM

## 2024-01-21 DIAGNOSIS — M5416 Radiculopathy, lumbar region: Secondary | ICD-10-CM | POA: Diagnosis not present

## 2024-01-21 NOTE — Therapy (Signed)
 OUTPATIENT PHYSICAL THERAPY TREATMENT NOTE    Patient Name: [name] MRN: [id number] DOB:02/13/1941, 83 y.o., female Today's Date: 01/21/2024  END OF SESSION:  PT End of Session - 01/21/24 1048     Visit Number 7    Number of Visits 9    Date for PT Re-Evaluation 01/21/24    Authorization Type MCR    Progress Note Due on Visit 10    PT Start Time 1048    PT Stop Time 1126    PT Time Calculation (min) 38 min    Activity Tolerance Patient tolerated treatment well    Behavior During Therapy WFL for tasks assessed/performed                Past Medical History:  Diagnosis Date   Allergy    Anal or rectal pain    sometimes   Anemia    hx of during pregnancy   Anxiety    Arthritis    Asthma    hx of   Bradycardia    " I KNOW I HAVE BRADYCARDIA ESPECIALLY WHEN I SLEEP"    Carotid artery occlusion    Cataract 2021   bilateral eyes   Chronic back pain    Degenerative joint disease    osteo   Depression    Diabetes mellitus without complication (HCC)    DM type II   Diverticulosis 2003   Dysrhythmia    hx of  due to eye drop and also low heart rate 40's per pt. Dr. Allyson Sabal follows   Elevated total protein    Esophageal dysmotility    Fibromyalgia    GERD (gastroesophageal reflux disease)    subsequent Nissen Fundoplication   Glaucoma    Hearing loss    Heart murmur    "was told she had a heart murmur"   Hemorrhoids    Hiatal hernia 11/08/2009   Hx of adenomatous colonic polyps 07/02/2002   Hypercalcemia    Hyperlipidemia    Hyperlipidemia    Hypertension    Implantable loop recorder present 11/28/2021   Nausea    Osteoporosis    PONV (postoperative nausea and vomiting)    Rectal bleeding    from hemorrhoids.     Sleep apnea    DOES USE CPAP    Thrombocytopenia (HCC)    Varicose veins of left lower extremity    Past Surgical History:  Procedure Laterality Date   CARDIAC CATHETERIZATION  03/14/1992   Normal cardiac cath. Normal LV function.    CARDIOVASCULAR STRESS TEST  01/22/2011   No scintigraphic evidence of inducible ischemia.   CAROTID DOPPLER  03/31/2007   Bilateral ICAs - no evidence of significant diameter reduction, dissectin, tortuosity, FMD, or any other vascular abnormality.   CATARACT EXTRACTION, BILATERAL     ESOPHAGEAL MANOMETRY N/A 11/14/2015   Procedure: ESOPHAGEAL MANOMETRY (EM);  Surgeon: Napoleon Form, MD;  Location: [facility];  Service: Endoscopy;  Laterality: N/A;   ESOPHAGEAL MANOMETRY N/A 01/18/2021   Procedure: ESOPHAGEAL MANOMETRY (EM);  Surgeon: Napoleon Form, MD;  Location: [facility];  Service: Endoscopy;  Laterality: N/A;   ESOPHAGOGASTRODUODENOSCOPY N/A 03/08/2021   Procedure: ESOPHAGOGASTRODUODENOSCOPY (EGD);  Surgeon: Corliss Skains, MD;  Location[facility];  Service: Thoracic;  Laterality: N/A;   EYE SURGERY     bilateral cataracts; bilateral stents   GASTRIC RESECTION  2009   GLAUCOMA SURGERY     JOINT REPLACEMENT     right knee Dr. Lequita Halt 06-23-18   KNEE  SURGERY Bilateral    NISSEN FUNDOPLICATION  2000   with subsequent takedown in 2009   TOTAL KNEE ARTHROPLASTY Left 06/10/2017   Procedure: LEFT  TOTAL KNEE ARTHROPLASTY;  Surgeon: Ollen Gross, MD;  Location: [facility];  Service: Orthopedics;  Laterality: Left;  Adductor Block   TOTAL KNEE ARTHROPLASTY Right 06/23/2018   Procedure: RIGHT TOTAL KNEE ARTHROPLASTY;  Surgeon: Ollen Gross, MD;  Location: [facility];  Service: Orthopedics;  Laterality: Right;   TRANSCAROTID ARTERY REVASCULARIZATION  Right 08/09/2023   Procedure: TRANSCAROTID ARTERY REVASCULARIZATION;  Surgeon: Leonie Douglas, MD;  Location: [facility] OR;  Service: Vascular;  Laterality: Right;   TRANSTHORACIC ECHOCARDIOGRAM  12/21/2010   EF 60%, moderate LVH,    TUBAL LIGATION     XI ROBOTIC ASSISTED HIATAL HERNIA REPAIR N/A 03/08/2021   Procedure: XI ROBOTIC ASSISTED LAPAROSCOPY WITH LYSIS OF ADHESIONS;  Surgeon: Corliss Skains, MD;  Location: [facility] OR;  Service: Thoracic;   Laterality: N/A;   Patient Active Problem List   Diagnosis Date Noted   Closed nondisplaced fracture of fifth metatarsal bone of left foot 08/07/2023   Stroke (HCC) 08/07/2023   Carotid stenosis, asymptomatic, right 08/07/2023   CVA (cerebral vascular accident) (HCC) 08/06/2023   Syncope 10/22/2022   Acute renal failure superimposed on stage 3a chronic kidney disease (HCC) 05/22/2022   AKI (acute kidney injury) (HCC) 05/21/2022   Hiatal hernia 03/08/2021   Heartburn    Ineffective esophageal motility    Dyspnea on exertion 07/24/2020   Change in bowel habits 08/22/2018   Knee stiff 07/23/2018   Degeneration of lumbar intervertebral disc 02/04/2018   Scoliosis of lumbar spine 02/04/2018   Sinus bradycardia 07/27/2016   Cough    Essential hypertension 11/24/2014   Gonalgia 05/13/2013   OA (osteoarthritis) of knee 05/13/2013   Degenerative joint disease involving multiple joints 01/19/2013   Rheumatoid arthritis (HCC) 01/19/2013   Thyroid nodule 06/11/2012   Elevated total protein    Cyst of thyroid 10/29/2011   Hemorrhoids, internal 05/28/2011   Acid reflux 02/14/2011   Avitaminosis D 02/13/2011   Glaucoma 11/28/2010   HLD (hyperlipidemia) 12/14/2009   Internal and external bleeding hemorrhoids 12/14/2009   DIVERTICULOSIS, COLON 12/14/2009   Joint pain 12/14/2009   BACK PAIN, CHRONIC 12/14/2009   FIBROMYALGIA 12/14/2009   OSTEOPOROSIS 12/14/2009   Sleep apnea 12/14/2009   History of colonic polyps 12/14/2009   GASTRIC POLYP, HX OF 12/14/2009   Diaphragmatic hernia 10/04/2009   Depression 09/29/2009   REFLUX ESOPHAGITIS 09/29/2009   GERD 09/29/2009   Constipation 09/29/2009   DYSPHAGIA 09/29/2009    PCP: Harvest Forest, MD   REFERRING PROVIDER: Leonie Douglas, MD   REFERRING DIAG:  Neck pain after TCAR     THERAPY DIAG:  Cervicalgia  Muscle weakness (generalized)  Rationale for Evaluation and Treatment: Rehabilitation  ONSET DATE:  07/29/2024  SUBJECTIVE:  SUBJECTIVE STATEMENT: Patient reports that she was prescribed a lidocaine patch which she is using at night. She notes some improvement in scar sensitivity with use of desensitization techniques and lidocaine patch.   EVAL: Patient reports to PT with history of fall in October following suspected CVA. She states that in October, they placed a stent in her R carotid artery. She states that the pain feels like she is "being strangled" especially with looking over her Right shoulder. She is ambulating with L immobilization boot and rollator d/t reported Marcelline Temkin fracture related to her fall. She states that her baseline is walking cane. She states that she was supposed to have shoulder replacement in November. She is reconsidering if she wants to undergo surgery and the post-op rehab because of her age.    Hand dominance: Right  PERTINENT HISTORY:  Relevant PMHx includes anxiety, arthritis, bradycardia, carotid artery occlusion, depression, DM type II, fibromyalgia, glaucoma, hearing loss, HTN, CVA, carotid stenosis, scoliosis of lumbar spine  PAIN:  Are you having pain?  Yes: NPRS scale: 3-10/10 Pain location: R side of neck  Pain description: tightness Aggravating factors: looking over her right shoulder Relieving factors: cold compress  PRECAUTIONS: Fall  RED FLAGS: None     WEIGHT BEARING RESTRICTIONS: No  FALLS:  Has patient fallen in last 6 months? Yes. Number of falls see MOI; and a couple of close calls  LIVING ENVIRONMENT: Lives with: lives with their spouse Lives in: House/apartment Stairs:  Yes, two external steps with railing  Has following equipment at home: Dan Humphreys - 4 wheeled  OCCUPATION: retired  PLOF: Independent  PATIENT GOALS: To get rid of  this uncomfortable feeling in my neck   NEXT MD VISIT: 03/10/2024  OBJECTIVE:  Note: Objective measures were completed at Evaluation unless otherwise noted.  DIAGNOSTIC FINDINGS:  08/07/2023 CT angio head neck w wo cm   IMPRESSION: 1. A 70% stenosis of the proximal right ICA due to mixed density atherosclerosis. This is a likely source of the previously demonstrated right MCA territory embolic infarcts. 2. Severe stenosis of the right vertebral artery V1 segment and occlusion of the V4 segment, just proximal to the vertebrobasilar confluence. Right PICA remains patent. 3. 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  PATIENT SURVEYS:  NDI 28/50 = 56%   COGNITION: Overall cognitive status: Within functional limits for tasks assessed  SENSATION: Not tested  POSTURE: rounded shoulders and forward head   CERVICAL ROM:   Active ROM A/PROM (deg) eval 01/09/2024 AROM  Flexion 50 65  Extension 10 30  Right lateral flexion 40 40  Left lateral flexion 30 40  Right rotation 50 50  Left rotation 30 50   (Blank rows = not tested)  UPPER EXTREMITY ROM:  Active ROM Right eval Left eval  Shoulder flexion    Shoulder extension    Shoulder abduction    Shoulder adduction    Shoulder extension    Shoulder internal rotation    Shoulder external rotation    Elbow flexion    Elbow extension    Wrist flexion    Wrist extension    Wrist ulnar deviation    Wrist radial deviation    Wrist pronation    Wrist supination     (Blank rows = not tested)  UPPER EXTREMITY MMT:  MMT Right eval Left eval  Shoulder flexion 2 2+  Shoulder extension    Shoulder abduction 2 2+  Shoulder adduction  Shoulder extension    Shoulder internal rotation    Shoulder external rotation    Middle trapezius    Lower trapezius    Elbow flexion    Elbow extension    Wrist flexion    Wrist extension    Wrist ulnar deviation    Wrist radial deviation     Wrist pronation    Wrist supination    Grip strength 20# 20#   (Blank rows = not tested)  CERVICAL SPECIAL TESTS:  Not assessed at time of eval   FUNCTIONAL TESTS:  Not assessed at time of eval   TREATMENT DATE:   [facility] Adult PT Treatment:                                                DATE: 01/21/2024   Therapeutic Exercise:  CS AROM x 10 each  L and R rotation  Flexion and extension  L and R lateral flexion  CS retraction 3 sec hold x 15  Scapular Retraction 3 sec hold, x 5  Discontinue d/t shoulder pain  R Levator Stretch 5 x 10 sec  R SCM stretch 5 x 10 sec  R Scalenes stretch 10 x 10 sec passive   Updated and Reviewed initial HEP     [facility] Adult PT Treatment:                                                DATE: 01/14/2024  Therapeutic Exercise:  Standing pull downs with green TB, 2 x 10  Standing rows with green TB, 2 x 10  R SCM stretches, 2 x 10   Neuromuscular Re-education  Desensitization techniques using guaze for light to medium pressure, washcloth with light to medium pressure  Manual Therapy:  Scar mobilization for improved mobility        HOME EXERCISE PROGRAM: Access Code: [email or web address] URL: [email or web address] Date: 01/02/2024 Prepared by: Mauri Reading  Exercises - Seated Upper Trapezius Stretch  - 2 x daily - 7 x weekly - 2 sets - 30 sec hold - Gentle Levator Scapulae Stretch  - 2 x daily - 7 x weekly - 2 sets - 30 sec hold - Sternocleidomastoid Stretch  - 2 x daily - 7 x weekly - 2 sets - 30 sec hold - Seated Cervical Retraction Protraction AROM  - 1 x daily - 3-4 x weekly - 2 sets - 10 reps - 3 sec hold - Seated Scapular Retraction  - 1 x daily - 3-4 x weekly - 2 sets - 10 reps - 3 sec hold  ASSESSMENT:  CLINICAL IMPRESSION: 01/21/2024 Patient responds well to today's treatment session.  She is demonstrating decreased sensitivity to palpation with scar mobility.  We held off on exercises requiring shoulder involvement today  due to previous exacerbation of RTC related injury.  Plan is to perform reassessment of subjective and objective measures to assess for progress towards established goals.  Discussed plan to determine discharge versus updated POC duration.  EVAL: Sweet is a 83 y.o. female who was seen today for physical therapy evaluation and treatment for neck pain with mobility deficits. She is demonstrating decreased CS AROM, decrease UE AROM, decreased UE MMT scores, and diminished grip strength BIL.  She has related pain and difficulty with looking over her right shoulder and performing related ADLs/IADLs. She requires skilled PT services at this time to address relevant deficits and improve overall function.     OBJECTIVE IMPAIRMENTS: decreased activity tolerance, decreased ROM, decreased strength, postural dysfunction, and pain.   ACTIVITY LIMITATIONS: carrying, lifting, reach over head, and hygiene/grooming  PARTICIPATION LIMITATIONS: meal prep, cleaning, and community activity  PERSONAL FACTORS: Age, Time since onset of injury/illness/exacerbation, and 3+ comorbidities: Relevant PMHx includes anxiety, arthritis, bradycardia, carotid artery occlusion, depression, DM type II, fibromyalgia, glaucoma, hearing loss, HTN, CVA, carotid stenosis, scoliosis of lumbar spine  are also affecting patient's functional outcome.   REHAB POTENTIAL: Fair    CLINICAL DECISION MAKING: Evolving/moderate complexity  EVALUATION COMPLEXITY: Moderate   GOALS: Goals reviewed with patient? Yes  SHORT TERM GOALS = LONG TERM GOALS: Target date: 01/21/2024   Patient will report improved overall functional ability with NDI score of 15/50 or less. Baseline: NDI 28/50 = 56% Goal status: INITIAL  2.  Patient will report no more than minimal pain or difficulty with looking over her R shoulder.  Baseline: moderate-to-severe pain  Goal status: INITIAL  3.  Patient will demonstrate improved grip strength to at least 25lbs  bilaterally Baseline: 20lbs bilaterally Goal status: INITIAL  4.  Patient will be independent with initial home program for CS mobility and UQ strengthening.  Baseline: provided at f/u visit Goal status: INITIAL  5.  Patient will report 5/10 worst pain with daily activities over the past week.  Baseline: 10/10 worst pain  Goal status: INITIAL   PLAN:  PT FREQUENCY: 1-2x/week  PT DURATION: 4 weeks  PLANNED INTERVENTIONS: [id number]- PT Re-evaluation, 97110-Therapeutic exercises, 97530- Therapeutic activity, [id number]- Neuromuscular re-education, 97535- Self Care, [id number]- Manual therapy, and Patient/Family education  PLAN FOR NEXT SESSION: address CS mobility, gentle SCM stretch, UQ strengthening   Mauri Reading, PT, DPT  01/21/2024 7:04 PM

## 2024-01-23 ENCOUNTER — Ambulatory Visit

## 2024-01-23 DIAGNOSIS — M542 Cervicalgia: Secondary | ICD-10-CM

## 2024-01-23 DIAGNOSIS — M25551 Pain in right hip: Secondary | ICD-10-CM | POA: Diagnosis not present

## 2024-01-23 DIAGNOSIS — M6281 Muscle weakness (generalized): Secondary | ICD-10-CM | POA: Diagnosis not present

## 2024-01-23 DIAGNOSIS — R262 Difficulty in walking, not elsewhere classified: Secondary | ICD-10-CM | POA: Diagnosis not present

## 2024-01-23 DIAGNOSIS — M5416 Radiculopathy, lumbar region: Secondary | ICD-10-CM | POA: Diagnosis not present

## 2024-01-23 DIAGNOSIS — M25572 Pain in left ankle and joints of left foot: Secondary | ICD-10-CM | POA: Diagnosis not present

## 2024-01-23 NOTE — Therapy (Signed)
 OUTPATIENT PHYSICAL THERAPY DISCHARGE NOTE   Patient Name: [name] MRN: [id number] DOB:Mar 29, 1941, 83 y.o., female Today's Date: 01/23/2024   PHYSICAL THERAPY DISCHARGE SUMMARY  Visits from Start of Care: 8   Current functional level related to goals / functional outcomes: See objective findings/assessment   Remaining deficits: See objective findings/assessment    Education / Equipment: See today's treatment/assessment      Patient agrees to discharge. Patient goals were met. Patient is being discharged due to meeting the stated rehab goals.     END OF SESSION:  PT End of Session - 01/23/24 1102     Visit Number 8    Number of Visits 9    Authorization Type MCR    PT Start Time 1050    PT Stop Time 1128    PT Time Calculation (min) 38 min    Activity Tolerance Patient tolerated treatment well    Behavior During Therapy WFL for tasks assessed/performed                 Past Medical History:  Diagnosis Date   Allergy    Anal or rectal pain    sometimes   Anemia    hx of during pregnancy   Anxiety    Arthritis    Asthma    hx of   Bradycardia    " I KNOW I HAVE BRADYCARDIA ESPECIALLY WHEN I SLEEP"    Carotid artery occlusion    Cataract 2021   bilateral eyes   Chronic back pain    Degenerative joint disease    osteo   Depression    Diabetes mellitus without complication (HCC)    DM type II   Diverticulosis 2003   Dysrhythmia    hx of  due to eye drop and also low heart rate 40's per pt. Dr. Allyson Sabal follows   Elevated total protein    Esophageal dysmotility    Fibromyalgia    GERD (gastroesophageal reflux disease)    subsequent Nissen Fundoplication   Glaucoma    Hearing loss    Heart murmur    "was told she had a heart murmur"   Hemorrhoids    Hiatal hernia 11/08/2009   Hx of adenomatous colonic polyps 07/02/2002   Hypercalcemia    Hyperlipidemia    Hyperlipidemia    Hypertension    Implantable loop recorder present 11/28/2021    Nausea    Osteoporosis    PONV (postoperative nausea and vomiting)    Rectal bleeding    from hemorrhoids.     Sleep apnea    DOES USE CPAP    Thrombocytopenia (HCC)    Varicose veins of left lower extremity    Past Surgical History:  Procedure Laterality Date   CARDIAC CATHETERIZATION  03/14/1992   Normal cardiac cath. Normal LV function.   CARDIOVASCULAR STRESS TEST  01/22/2011   No scintigraphic evidence of inducible ischemia.   CAROTID DOPPLER  03/31/2007   Bilateral ICAs - no evidence of significant diameter reduction, dissectin, tortuosity, FMD, or any other vascular abnormality.   CATARACT EXTRACTION, BILATERAL     ESOPHAGEAL MANOMETRY N/A 11/14/2015   Procedure: ESOPHAGEAL MANOMETRY (EM);  Surgeon: Napoleon Form, MD;  Location: [facility];  Service: Endoscopy;  Laterality: N/A;   ESOPHAGEAL MANOMETRY N/A 01/18/2021   Procedure: ESOPHAGEAL MANOMETRY (EM);  Surgeon: Napoleon Form, MD;  Location: [facility];  Service: Endoscopy;  Laterality: N/A;   ESOPHAGOGASTRODUODENOSCOPY N/A 03/08/2021   Procedure: ESOPHAGOGASTRODUODENOSCOPY (EGD);  Surgeon: Cliffton Asters,  Eliezer Lofts, MD;  Location: [facility];  Service: Thoracic;  Laterality: N/A;   EYE SURGERY     bilateral cataracts; bilateral stents   GASTRIC RESECTION  2009   GLAUCOMA SURGERY     JOINT REPLACEMENT     right knee Dr. Lequita Halt 06-23-18   KNEE SURGERY Bilateral    NISSEN FUNDOPLICATION  2000   with subsequent takedown in 2009   TOTAL KNEE ARTHROPLASTY Left 06/10/2017   Procedure: LEFT  TOTAL KNEE ARTHROPLASTY;  Surgeon: Ollen Gross, MD;  Location: [facility];  Service: Orthopedics;  Laterality: Left;  Adductor Block   TOTAL KNEE ARTHROPLASTY Right 06/23/2018   Procedure: RIGHT TOTAL KNEE ARTHROPLASTY;  Surgeon: Ollen Gross, MD;  Location: [facility];  Service: Orthopedics;  Laterality: Right;   TRANSCAROTID ARTERY REVASCULARIZATION  Right 08/09/2023   Procedure: TRANSCAROTID ARTERY REVASCULARIZATION;  Surgeon: Leonie Douglas, MD;  Location: [facility];  Service: Vascular;  Laterality: Right;   TRANSTHORACIC ECHOCARDIOGRAM  12/21/2010   EF 60%, moderate LVH,    TUBAL LIGATION     XI ROBOTIC ASSISTED HIATAL HERNIA REPAIR N/A 03/08/2021   Procedure: XI ROBOTIC ASSISTED LAPAROSCOPY WITH LYSIS OF ADHESIONS;  Surgeon: Corliss Skains, MD;  Location: [facility] OR;  Service: Thoracic;  Laterality: N/A;   Patient Active Problem List   Diagnosis Date Noted   Closed nondisplaced fracture of fifth metatarsal bone of left foot 08/07/2023   Stroke (HCC) 08/07/2023   Carotid stenosis, asymptomatic, right 08/07/2023   CVA (cerebral vascular accident) (HCC) 08/06/2023   Syncope 10/22/2022   Acute renal failure superimposed on stage 3a chronic kidney disease (HCC) 05/22/2022   AKI (acute kidney injury) (HCC) 05/21/2022   Hiatal hernia 03/08/2021   Heartburn    Ineffective esophageal motility    Dyspnea on exertion 07/24/2020   Change in bowel habits 08/22/2018   Knee stiff 07/23/2018   Degeneration of lumbar intervertebral disc 02/04/2018   Scoliosis of lumbar spine 02/04/2018   Sinus bradycardia 07/27/2016   Cough    Essential hypertension 11/24/2014   Gonalgia 05/13/2013   OA (osteoarthritis) of knee 05/13/2013   Degenerative joint disease involving multiple joints 01/19/2013   Rheumatoid arthritis (HCC) 01/19/2013   Thyroid nodule 06/11/2012   Elevated total protein    Cyst of thyroid 10/29/2011   Hemorrhoids, internal 05/28/2011   Acid reflux 02/14/2011   Avitaminosis D 02/13/2011   Glaucoma 11/28/2010   HLD (hyperlipidemia) 12/14/2009   Internal and external bleeding hemorrhoids 12/14/2009   DIVERTICULOSIS, COLON 12/14/2009   Joint pain 12/14/2009   BACK PAIN, CHRONIC 12/14/2009   FIBROMYALGIA 12/14/2009   OSTEOPOROSIS 12/14/2009   Sleep apnea 12/14/2009   History of colonic polyps 12/14/2009   GASTRIC POLYP, HX OF 12/14/2009   Diaphragmatic hernia 10/04/2009   Depression 09/29/2009   REFLUX ESOPHAGITIS  09/29/2009   GERD 09/29/2009   Constipation 09/29/2009   DYSPHAGIA 09/29/2009    PCP: Harvest Forest, MD   REFERRING PROVIDER: Leonie Douglas, MD   REFERRING DIAG:  Neck pain after TCAR     THERAPY DIAG:  Cervicalgia  Muscle weakness (generalized)  Rationale for Evaluation and Treatment: Rehabilitation  ONSET DATE: 07/29/2024  SUBJECTIVE:  SUBJECTIVE STATEMENT: Patient reports that she still has decreased pain with scar massage. She has recently begun use of lidocaine patch at night which she states is helpful. She feels that she has less pain with neck/head motions.When answering NDI survey she states "this is hard, because a lot of things are difficult because of reasons other than my neck"    Hand dominance: Right  PERTINENT HISTORY:  Relevant PMHx includes anxiety, arthritis, bradycardia, carotid artery occlusion, depression, DM type II, fibromyalgia, glaucoma, hearing loss, HTN, CVA, carotid stenosis, scoliosis of lumbar spine  PAIN:  Are you having pain?  Yes: NPRS scale: 3-10/10 Pain location: R side of neck  Pain description: tightness Aggravating factors: looking over her right shoulder Relieving factors: cold compress  PRECAUTIONS: Fall  RED FLAGS: None     WEIGHT BEARING RESTRICTIONS: No  FALLS:  Has patient fallen in last 6 months? Yes. Number of falls see MOI; and a couple of close calls  LIVING ENVIRONMENT: Lives with: lives with their spouse Lives in: House/apartment Stairs:  Yes, two external steps with railing  Has following equipment at home: Dan Humphreys - 4 wheeled  OCCUPATION: retired  PLOF: Independent  PATIENT GOALS: To get rid of this uncomfortable feeling in my neck   NEXT MD VISIT: 03/10/2024  OBJECTIVE:  Note: Objective  measures were completed at Evaluation unless otherwise noted.  DIAGNOSTIC FINDINGS:  08/07/2023 CT angio head neck w wo cm   IMPRESSION: 1. A 70% stenosis of the proximal right ICA due to mixed density atherosclerosis. This is a likely source of the previously demonstrated right MCA territory embolic infarcts. 2. Severe stenosis of the right vertebral artery V1 segment and occlusion of the V4 segment, just proximal to the vertebrobasilar confluence. Right PICA remains patent. 3. 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  PATIENT SURVEYS:  NDI 28/50 = 56%   COGNITION: Overall cognitive status: Within functional limits for tasks assessed  SENSATION: Not tested  POSTURE: rounded shoulders and forward head   CERVICAL ROM:   Active ROM A/PROM (deg) eval 01/09/2024 AROM  Flexion 50 65  Extension 10 30  Right lateral flexion 40 40  Left lateral flexion 30 40  Right rotation 50 50  Left rotation 30 50   (Blank rows = not tested)  UPPER EXTREMITY ROM:  Active ROM Right eval Left eval  Shoulder flexion    Shoulder extension    Shoulder abduction    Shoulder adduction    Shoulder extension    Shoulder internal rotation    Shoulder external rotation    Elbow flexion    Elbow extension    Wrist flexion    Wrist extension    Wrist ulnar deviation    Wrist radial deviation    Wrist pronation    Wrist supination     (Blank rows = not tested)  UPPER EXTREMITY MMT:  MMT Right eval Left eval Right 01/23/24 Left 01/23/24  Shoulder flexion 2 2+    Shoulder extension      Shoulder abduction 2 2+    Shoulder adduction      Shoulder extension      Shoulder internal rotation      Shoulder external rotation      Middle trapezius      Lower trapezius      Elbow flexion      Elbow extension      Wrist flexion      Wrist extension  Wrist ulnar deviation      Wrist radial deviation      Wrist pronation      Wrist supination       Grip strength 20# 20# 30#, 25# 20#   (Blank rows = not tested)  CERVICAL SPECIAL TESTS:  Not assessed at time of eval   FUNCTIONAL TESTS:  Not assessed at time of eval   TREATMENT DATE:   [facility] Adult PT Treatment:                                                DATE: 01/23/2024  Therapeutic Activity:  Reassessment of objective measures and subjective assessment regarding progress towards established goals and plan for independence with prescribed home program following discharged from PT Updated and reviewed written HEP, including written patient education for scar massage and desensitization techniques.      HOME EXERCISE PROGRAM: Access Code: [email or web address] URL: [email or web address] Date: 01/02/2024 Prepared by: Mauri Reading  Exercises - Seated Upper Trapezius Stretch  - 2 x daily - 7 x weekly - 2 sets - 30 sec hold - Gentle Levator Scapulae Stretch  - 2 x daily - 7 x weekly - 2 sets - 30 sec hold - Sternocleidomastoid Stretch  - 2 x daily - 7 x weekly - 2 sets - 30 sec hold - Seated Cervical Retraction Protraction AROM  - 1 x daily - 3-4 x weekly - 2 sets - 10 reps - 3 sec hold - Seated Scapular Retraction  - 1 x daily - 3-4 x weekly - 2 sets - 10 reps - 3 sec hold  ASSESSMENT:  CLINICAL IMPRESSION: Vada is a pleasant 83 y.o. female who has attended 8 PT sessions d/t neck pain with mobility deficits. She has made significant improvements in CS AROM, grip strength and scar sensitivity. Provided patient with updated HEP, which includes instructions for continued use of desensitization techniques and scar massage. Patient is discharged from skilled PT at this time. Patient is anticipating need for future PT L foot injury, and verbalizes understanding that she may return to our location with appropriate referral at that time.    OBJECTIVE IMPAIRMENTS: decreased activity tolerance, decreased ROM, decreased strength, postural dysfunction, and pain.   ACTIVITY  LIMITATIONS: carrying, lifting, reach over head, and hygiene/grooming  PARTICIPATION LIMITATIONS: meal prep, cleaning, and community activity  PERSONAL FACTORS: Age, Time since onset of injury/illness/exacerbation, and 3+ comorbidities: Relevant PMHx includes anxiety, arthritis, bradycardia, carotid artery occlusion, depression, DM type II, fibromyalgia, glaucoma, hearing loss, HTN, CVA, carotid stenosis, scoliosis of lumbar spine  are also affecting patient's functional outcome.   REHAB POTENTIAL: Fair    CLINICAL DECISION MAKING: Evolving/moderate complexity  EVALUATION COMPLEXITY: Moderate   GOALS: Goals reviewed with patient? Yes  SHORT TERM GOALS = LONG TERM GOALS: Target date: 01/21/2024   Patient will report improved overall functional ability with NDI score of 15/50 or less. Baseline: NDI 28/50 = 56% 01/23/24: 24/50 = 48%  Goal status: not met   2.  Patient will report no more than minimal pain or difficulty with looking over her R shoulder.  Baseline: moderate-to-severe pain  01/23/24: "just some stiffness"  Goal status:MET  3.  Patient will demonstrate improved grip strength to at least 25lbs bilaterally Baseline: 20lbs bilaterally Goal status: partially met  4.  Patient will be independent with initial home program for  CS mobility and UQ strengthening.  Baseline: provided at f/u visit Goal status: MET; working on them every day  5.  Patient will report 5/10 worst pain with daily activities over the past week.  Baseline: 10/10 worst pain  01/23/24: mostly mild pain. "Its worse sometimes when I'm massaging [the scar]"  Goal status: MET     PLAN:  PT FREQUENCY: 1-2x/week  PT DURATION: 4 weeks  PLANNED INTERVENTIONS: 97164- PT Re-evaluation, 97110-Therapeutic exercises, 97530- Therapeutic activity, [id number]- Neuromuscular re-education, 97535- Self Care, [id number]- Manual therapy, and Patient/Family education  PLAN FOR NEXT SESSION: discharged with updated HEP     Mauri Reading, PT, DPT  01/24/2024 8:16 PM

## 2024-01-24 NOTE — Progress Notes (Signed)
 [facility] Summary Report / Loop Recorder

## 2024-01-24 NOTE — Addendum Note (Signed)
 Addended by: Geralyn Flash D on: 01/24/2024 12:56 PM   Modules accepted: Orders

## 2024-01-27 ENCOUNTER — Ambulatory Visit: Admitting: Podiatry

## 2024-01-27 ENCOUNTER — Ambulatory Visit (INDEPENDENT_AMBULATORY_CARE_PROVIDER_SITE_OTHER)

## 2024-01-27 ENCOUNTER — Ambulatory Visit (INDEPENDENT_AMBULATORY_CARE_PROVIDER_SITE_OTHER): Payer: Medicare Other

## 2024-01-27 ENCOUNTER — Encounter: Payer: Self-pay | Admitting: Podiatry

## 2024-01-27 DIAGNOSIS — S92355K Nondisplaced fracture of fifth metatarsal bone, left foot, subsequent encounter for fracture with nonunion: Secondary | ICD-10-CM | POA: Diagnosis not present

## 2024-01-27 DIAGNOSIS — R55 Syncope and collapse: Secondary | ICD-10-CM

## 2024-01-27 NOTE — Progress Notes (Signed)
 Subjective:  Patient ID: [name], female    DOB: 1940/10/23,   MRN: [id number]  No chief complaint on file.   83 y.o. female presents for follow-up of fifth metatarsal fracture. Relates she is doing ok and pain better she still relates shooting pains through the foot to the toes although this is getting better. Relates her back and her hip have been hurtin.   She has been using the bone stimulator consistently.   Relates burning and tingling in their feet. Patient is diabetic and last A1c was  Lab Results  Component Value Date   HGBA1C 6.8 (H) 08/07/2023   .     PCP:  Nohemi Batters, MD      PCP:  Nohemi Batters, MD    . Denies any other pedal complaints. Denies n/v/f/c.   Past Medical History:  Diagnosis Date   Allergy    Anal or rectal pain    sometimes   Anemia    hx of during pregnancy   Anxiety    Arthritis    Asthma    hx of   Bradycardia    " I KNOW I HAVE BRADYCARDIA ESPECIALLY WHEN I SLEEP"    Carotid artery occlusion    Cataract 2021   bilateral eyes   Chronic back pain    Degenerative joint disease    osteo   Depression    Diabetes mellitus without complication (HCC)    DM type II   Diverticulosis 2003   Dysrhythmia    hx of  due to eye drop and also low heart rate 40's per pt. Dr. Katheryne Pane follows   Elevated total protein    Esophageal dysmotility    Fibromyalgia    GERD (gastroesophageal reflux disease)    subsequent Nissen Fundoplication   Glaucoma    Hearing loss    Heart murmur    "was told she had a heart murmur"   Hemorrhoids    Hiatal hernia 11/08/2009   Hx of adenomatous colonic polyps 07/02/2002   Hypercalcemia    Hyperlipidemia    Hyperlipidemia    Hypertension    Implantable loop recorder present 11/28/2021   Nausea    Osteoporosis    PONV (postoperative nausea and vomiting)    Rectal bleeding    from hemorrhoids.     Sleep apnea    DOES USE CPAP    Thrombocytopenia (HCC)    Varicose veins of left lower extremity      Objective:  Physical Exam: Vascular: DP/PT pulses 1/4 bilateral. CFT <3 seconds. Normal hair growth on digits. No edema.  Skin. No lacerations or abrasions bilateral feet. Nails 1-5 bilateral are thickened elongated and with subungual debris. Scaling and erythema noted to mostly left plantar feet.  Musculoskeletal: MMT 5/5 bilateral lower extremities in DF, PF, Inversion and Eversion. Deceased ROM in DF of ankle joint. Collapse of medial arch bilateral with eversion of calcaneus. Tender to the latearl aspect of the foot around the fifth metatarsal diaphysis and proximally along the peroneal tendon insertion. No pain with ROM of the fifth digit. Most pain over proximal process of fifth metatarsal. Improvement noted in tenderness Neurological: Sensation intact to light touch.   Assessment:   1. 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 -Answered all patient questions -Xrays reviewed. Acute fracture noted to proximal fifth metatarsal noted through  lateral half of bone just distal to tarsometatarsal joint. Fracture line appears to be nearly completely healed.  -Discussed treatement options for fifth metatarsal fracture non union; risks, alternatives, and benefits explained. -Discontinue CAM boot and transition to regular shoes as feel this is contirbuting to back pain and referred nerve pain in the foot.  -Referral to PT placed to help with transition out of boot and potentially back exercises. .  -Recommend protection, rest, ice, elevation daily until symptoms improve -Continue bone sitmulator.  -Patient to return to office in 2 months for rfc.   Jennefer Moats, DPM

## 2024-01-28 LAB — CUP PACEART REMOTE DEVICE CHECK
Date Time Interrogation Session: 20250413231819
Implantable Pulse Generator Implant Date: 20240222

## 2024-02-06 ENCOUNTER — Ambulatory Visit

## 2024-02-06 ENCOUNTER — Other Ambulatory Visit: Payer: Self-pay

## 2024-02-06 DIAGNOSIS — M6281 Muscle weakness (generalized): Secondary | ICD-10-CM

## 2024-02-06 DIAGNOSIS — R262 Difficulty in walking, not elsewhere classified: Secondary | ICD-10-CM

## 2024-02-06 DIAGNOSIS — M25551 Pain in right hip: Secondary | ICD-10-CM

## 2024-02-06 DIAGNOSIS — M5416 Radiculopathy, lumbar region: Secondary | ICD-10-CM

## 2024-02-06 DIAGNOSIS — M25572 Pain in left ankle and joints of left foot: Secondary | ICD-10-CM | POA: Diagnosis not present

## 2024-02-06 DIAGNOSIS — M542 Cervicalgia: Secondary | ICD-10-CM | POA: Diagnosis not present

## 2024-02-06 NOTE — Therapy (Signed)
 OUTPATIENT PHYSICAL THERAPY LOWER EXTREMITY EVALUATION   Patient Name: [name] MRN: [id number] DOB:[birth year], 83 y.o., female Today's Date: 02/06/2024  END OF SESSION:  PT End of Session - 02/06/24 1006     Visit Number 1    Number of Visits 1    Date for PT Re-Evaluation 01/21/24    Authorization Type MCR    Progress Note Due on Visit 10    PT Start Time 1000    PT Stop Time 1045    PT Time Calculation (min) 45 min    Activity Tolerance Patient tolerated treatment well    Behavior During Therapy WFL for tasks assessed/performed             Past Medical History:  Diagnosis Date   Allergy    Anal or rectal pain    sometimes   Anemia    hx of during pregnancy   Anxiety    Arthritis    Asthma    hx of   Bradycardia    " I KNOW I HAVE BRADYCARDIA ESPECIALLY WHEN I SLEEP"    Carotid artery occlusion    Cataract 2021   bilateral eyes   Chronic back pain    Degenerative joint disease    osteo   Depression    Diabetes mellitus without complication (HCC)    DM type II   Diverticulosis 2003   Dysrhythmia    hx of  due to eye drop and also low heart rate 40's per pt. Dr. Katheryne Pane follows   Elevated total protein    Esophageal dysmotility    Fibromyalgia    GERD (gastroesophageal reflux disease)    subsequent Nissen Fundoplication   Glaucoma    Hearing loss    Heart murmur    "was told she had a heart murmur"   Hemorrhoids    Hiatal hernia 11/08/2009   Hx of adenomatous colonic polyps 07/02/2002   Hypercalcemia    Hyperlipidemia    Hyperlipidemia    Hypertension    Implantable loop recorder present 11/28/2021   Nausea    Osteoporosis    PONV (postoperative nausea and vomiting)    Rectal bleeding    from hemorrhoids.     Sleep apnea    DOES USE CPAP    Thrombocytopenia (HCC)    Varicose veins of left lower extremity    Past Surgical History:  Procedure Laterality Date   CARDIAC CATHETERIZATION  03/14/1992   Normal cardiac cath. Normal LV  function.   CARDIOVASCULAR STRESS TEST  01/22/2011   No scintigraphic evidence of inducible ischemia.   CAROTID DOPPLER  03/31/2007   Bilateral ICAs - no evidence of significant diameter reduction, dissectin, tortuosity, FMD, or any other vascular abnormality.   CATARACT EXTRACTION, BILATERAL     ESOPHAGEAL MANOMETRY N/A 11/14/2015   Procedure: ESOPHAGEAL MANOMETRY (EM);  Surgeon: Sergio Dandy, MD;  Location: [facility];  Service: Endoscopy;  Laterality: N/A;   ESOPHAGEAL MANOMETRY N/A 01/18/2021   Procedure: ESOPHAGEAL MANOMETRY (EM);  Surgeon: Sergio Dandy, MD;  Location: [facility];  Service: Endoscopy;  Laterality: N/A;   ESOPHAGOGASTRODUODENOSCOPY N/A 03/08/2021   Procedure: ESOPHAGOGASTRODUODENOSCOPY (EGD);  Surgeon: Hilarie Lovely, MD;  Location: [facility];  Service: Thoracic;  Laterality: N/A;   EYE SURGERY     bilateral cataracts; bilateral stents   GASTRIC RESECTION  2009   GLAUCOMA SURGERY     JOINT REPLACEMENT     right knee Dr. France Ina 06-23-18   KNEE SURGERY Bilateral  NISSEN FUNDOPLICATION  2000   with subsequent takedown in 2009   TOTAL KNEE ARTHROPLASTY Left 06/10/2017   Procedure: LEFT  TOTAL KNEE ARTHROPLASTY;  Surgeon: Liliane Rei, MD;  Location: [facility];  Service: Orthopedics;  Laterality: Left;  Adductor Block   TOTAL KNEE ARTHROPLASTY Right 06/23/2018   Procedure: RIGHT TOTAL KNEE ARTHROPLASTY;  Surgeon: Liliane Rei, MD;  Location: [facility];  Service: Orthopedics;  Laterality: Right;   TRANSCAROTID ARTERY REVASCULARIZATION  Right 08/09/2023   Procedure: TRANSCAROTID ARTERY REVASCULARIZATION;  Surgeon: Carlene Che, MD;  Location: [facility] OR;  Service: Vascular;  Laterality: Right;   TRANSTHORACIC ECHOCARDIOGRAM  12/21/2010   EF 60%, moderate LVH,    TUBAL LIGATION     XI ROBOTIC ASSISTED HIATAL HERNIA REPAIR N/A 03/08/2021   Procedure: XI ROBOTIC ASSISTED LAPAROSCOPY WITH LYSIS OF ADHESIONS;  Surgeon: Hilarie Lovely, MD;  Location: [facility] OR;  Service:  Thoracic;  Laterality: N/A;   Patient Active Problem List   Diagnosis Date Noted   Closed nondisplaced fracture of fifth metatarsal bone of left foot 08/07/2023   Stroke (HCC) 08/07/2023   Carotid stenosis, asymptomatic, right 08/07/2023   CVA (cerebral vascular accident) (HCC) 08/06/2023   Syncope 10/22/2022   Acute renal failure superimposed on stage 3a chronic kidney disease (HCC) 05/22/2022   AKI (acute kidney injury) (HCC) 05/21/2022   Hiatal hernia 03/08/2021   Heartburn    Ineffective esophageal motility    Dyspnea on exertion 07/24/2020   Change in bowel habits 08/22/2018   Knee stiff 07/23/2018   Degeneration of lumbar intervertebral disc 02/04/2018   Scoliosis of lumbar spine 02/04/2018   Sinus bradycardia 07/27/2016   Cough    Essential hypertension 11/24/2014   Gonalgia 05/13/2013   OA (osteoarthritis) of knee 05/13/2013   Degenerative joint disease involving multiple joints 01/19/2013   Rheumatoid arthritis (HCC) 01/19/2013   Thyroid  nodule 06/11/2012   Elevated total protein    Cyst of thyroid  10/29/2011   Hemorrhoids, internal 05/28/2011   Acid reflux 02/14/2011   Avitaminosis D 02/13/2011   Glaucoma 11/28/2010   HLD (hyperlipidemia) 12/14/2009   Internal and external bleeding hemorrhoids 12/14/2009   DIVERTICULOSIS, COLON 12/14/2009   Joint pain 12/14/2009   BACK PAIN, CHRONIC 12/14/2009   FIBROMYALGIA 12/14/2009   OSTEOPOROSIS 12/14/2009   Sleep apnea 12/14/2009   History of colonic polyps 12/14/2009   GASTRIC POLYP, HX OF 12/14/2009   Diaphragmatic hernia 10/04/2009   Depression 09/29/2009   REFLUX ESOPHAGITIS 09/29/2009   GERD 09/29/2009   Constipation 09/29/2009   DYSPHAGIA 09/29/2009    PCP: Nohemi Batters, MD  REFERRING PROVIDER: Jennefer Moats, DPM  REFERRING DIAG: 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 She states that she doesn't have a clear memory of the fall. "The doctors thought that I had a stroke. I went to lock the door and the next thing that I remember is that I was on the floor and partially on the end table."  She states that she is still using the bone stimulator as instructed by referring provider. She is  now able to wean from CAM boot. She states that she started walking to the bathroom without the boot at home. Today is her first day ambulating outside of the house without the CAM boot. She additionally reports hip and back pain over the past several months while ambulating with CAM boot and rollator.      PERTINENT HISTORY: Relevant PMHx includes anxiety, arthritis, bradycardia, carotid artery occlusion, depression, DM type II, fibromyalgia, glaucoma, hearing loss, HTN, CVA, carotid stenosis, scoliosis of lumbar spine   PAIN:  Are you having pain?  Yes: NPRS scale: 3/10 current, 10/10 (occasionally, nerve pain) Pain location: L foot (lateral aspect), hips, lower back   Pain description: dull ache  Aggravating factors: weight bearing Relieving factors: sitting, laying down  PRECAUTIONS: Fall  RED FLAGS: None   WEIGHT BEARING RESTRICTIONS: Yes WBAT, wean/discontinue CAM boot and transition to regular shoes  FALLS:  Has patient fallen in last 6 months? Yes. Number of falls 1, see MOI   LIVING ENVIRONMENT: Lives with: lives with their spouse Lives in: House/apartment Stairs: Yes: two external steps with railing  Has following equipment at home: Single point cane and Walker - 4 wheeled for community ambulation   OCCUPATION: n/a    PLOF: Independent return to pain-free household mobility, ambulation, household chores   PATIENT GOALS: To return to ambulating with regular shoes and cane; address pain in foot, hip/back  NEXT MD VISIT: 03/30/2024  OBJECTIVE:  Note: Objective measures were completed at Evaluation unless otherwise noted.  DIAGNOSTIC FINDINGS:   Per referring provider office note (01/27/2024): "Xrays reviewed. Acute fracture noted to proximal fifth metatarsal noted through lateral half of bone just distal to tarsometatarsal joint. Fracture line appears to be nearly completely healed."   PATIENT SURVEYS:  LEFS 22/80  COGNITION: Overall cognitive status: Within functional limits for tasks assessed     SENSATION: Not tested  PALPATION: Moderate tenderness to palpation and proximal end of 5th metatarsal  LUMBAR ROM:   Active  A/PROM  eval  Flexion 80%  Extension 30%, back and hip pain reported  Right lateral flexion 75%  Left lateral flexion 75%  Right rotation 30%, stiffness  Left rotation 40%, stiffness   (Blank rows = not tested)   LOWER EXTREMITY ROM:  Active ROM Right eval Left eval  Hip flexion    Hip extension    Hip abduction    Hip adduction    Hip internal rotation    Hip external rotation    Knee flexion    Knee extension    Ankle dorsiflexion 10 -5  Ankle plantarflexion 35 28  Ankle inversion 16 14  Ankle eversion 15 3   (Blank rows = not tested)  LOWER EXTREMITY MMT:   MMT Right eval Left eval  Hip flexion    Hip extension    Hip abduction    Hip adduction    Hip internal rotation    Hip external rotation    Knee flexion    Knee extension    Ankle dorsiflexion    Ankle plantarflexion    Ankle inversion    Ankle eversion     (Blank rows = not tested)   FUNCTIONAL TESTS:  5 times sit to stand: 16 seconds with UE, reports burning in foot.   GAIT: Distance walked: 30 ft from lobby to evaluation area Assistive device utilized: Walker - 4  wheeled Level of assistance: Complete Independence Comments: antalgic gait pattern with decreased L stance time  TREATMENT DATE:   [facility] Adult PT Treatment:                                                DATE: 02/06/2024   Initial evaluation: see patient education and home exercise program as noted below      PATIENT EDUCATION:  Education details: discussed benefit of sock aid for compression sock. reviewed initial home exercise program; discussion of POC, prognosis and goals for skilled PT   Person educated: Patient Education method: Explanation, Demonstration, and Handouts Education comprehension: verbalized understanding, returned demonstration, and needs further education  HOME EXERCISE PROGRAM: Access Code: [email or web address] URL: [email or web address] Date: 02/06/2024 Prepared by: Arlester Bence  Program Notes Remember to look for a "Sock Aid" to help with putting on your compression socks.   Exercises - Supine Active Ankle Pumps  - 1 x daily - 7 x weekly - 2 sets - 10 reps - Ankle Inversion Eversion Towel Slide  - 1 x daily - 7 x weekly - 2 sets - 10 reps - Seated Toe Curl  - 1 x daily - 7 x weekly - 2 sets - 10 reps - Toe Spreading  - 1 x daily - 7 x weekly - 2 sets - 10 reps  ASSESSMENT:  CLINICAL IMPRESSION: Elina is a 83 y.o. female who was seen today for physical therapy evaluation and treatment for Left Foot Pain with mobility deficits, related to slow healing (>6 month) 5th metatarsal fracture. She is demonstrating decreased L ankle AROM, decreased L ankle MMT, altered gait mechanics, and decreased STS time. She has developed BIL hip and low back pain that is likely related to extended time ambulating with CAM boot, which she is currently weaning from. She has related pain and difficulty with walking, ADLs/IADLs. She also remains at risk  for falls, and requires patient education and PT intervention to decrease risk of falls. She requires skilled PT services at this time to address relevant deficits and improve overall function.     OBJECTIVE IMPAIRMENTS: Abnormal gait, decreased activity tolerance, decreased balance, decreased mobility, difficulty walking, decreased ROM, decreased strength, increased edema, and pain.   ACTIVITY LIMITATIONS: standing, squatting, and stairs  PARTICIPATION LIMITATIONS: meal prep, cleaning, laundry, shopping, and community activity  PERSONAL FACTORS: Age, Time since onset of injury/illness/exacerbation, and 3+ comorbidities: Relevant PMHx includes anxiety, arthritis, bradycardia, carotid artery occlusion, depression, DM type II, fibromyalgia, glaucoma, hearing loss, HTN, CVA, carotid stenosis, scoliosis of lumbar spine  are also affecting patient's functional outcome.   REHAB POTENTIAL: Fair    CLINICAL DECISION MAKING: Evolving/moderate complexity  EVALUATION COMPLEXITY: Moderate   GOALS: Goals reviewed with patient? YES  SHORT TERM GOALS: Target date: 03/05/2024  Patient will be independent with initial home program at least 3 days/week.  Baseline: provided at eval Goal Status: INITIAL   2.  Patient will demonstrate complete 5 time Sit-to-Stand test in 14 seconds or less in order to demonstrate decreasing risk of falls.  Baseline: see objective measures Goal Status: INITIAL   3.  Patient will demonstrate improved L ankle DF AROM to at least neutral (0 degrees).  Baseline: lacking 5 degrees of DF Goal status: INITIAL   LONG TERM GOALS: Target date: 04/02/2024  Patient will report improved overall functional ability with LEFS score of 60/80 or greater.  Baseline: 22/80 Goal Status: INITIAL   2.  Patient  will return to safe ambulation with [facility] for household and community ambulation.  Baseline: ambulating with antalgic gait with rollator  Goal status: INITIAL  3.  Patient will  demonstrate at least 4+/5 MMT with LQ testing in L LE. Baseline: see objective measures  Goal status: INITIAL  4.  Patient will report ability to perform household chores and ADLs with no greater than 8/65 foot pain.  Baseline: up to 10/10 pain; moderate-to-severe difficulty perform activities around her home  Goal status: INITIAL     PLAN:  PT FREQUENCY: 1-2x/week  PT DURATION: 8 weeks  PLANNED INTERVENTIONS: [id number]- PT Re-evaluation, 97750- Physical Performance Testing, 97110-Therapeutic exercises, 97530- Therapeutic activity, [id number]- Neuromuscular re-education, 97535- Self Care, [id number]- Manual therapy, [phone]- Gait training, [phone]- Aquatic Therapy, [phone]- Vasopneumatic device, Patient/Family education, Balance training, Stair training, Taping, Joint mobilization, Cryotherapy, and Moist heat  PLAN FOR NEXT SESSION:  assess LBP and relevant obj measures; FGA or DGI when appropriate; progress towards established goals (to include ankle AROM, ankle strengthening, knee/hip strengthening, weight bearing, balance, gait training, progress to [facility] when appropriate, low-impact aerobic activity, manual therapy as indicated, other modalities as indicated).    Arlester Bence, PT, DPT  02/09/2024 5:19 PM

## 2024-02-10 DIAGNOSIS — H5211 Myopia, right eye: Secondary | ICD-10-CM | POA: Diagnosis not present

## 2024-02-10 DIAGNOSIS — H18513 Endothelial corneal dystrophy, bilateral: Secondary | ICD-10-CM | POA: Diagnosis not present

## 2024-02-10 DIAGNOSIS — H401132 Primary open-angle glaucoma, bilateral, moderate stage: Secondary | ICD-10-CM | POA: Diagnosis not present

## 2024-02-10 DIAGNOSIS — E119 Type 2 diabetes mellitus without complications: Secondary | ICD-10-CM | POA: Diagnosis not present

## 2024-02-13 ENCOUNTER — Encounter: Payer: Self-pay | Admitting: Physical Therapy

## 2024-02-13 ENCOUNTER — Ambulatory Visit: Attending: Podiatry | Admitting: Physical Therapy

## 2024-02-13 DIAGNOSIS — R262 Difficulty in walking, not elsewhere classified: Secondary | ICD-10-CM | POA: Diagnosis not present

## 2024-02-13 DIAGNOSIS — M5416 Radiculopathy, lumbar region: Secondary | ICD-10-CM | POA: Insufficient documentation

## 2024-02-13 DIAGNOSIS — M25552 Pain in left hip: Secondary | ICD-10-CM | POA: Diagnosis not present

## 2024-02-13 DIAGNOSIS — M25572 Pain in left ankle and joints of left foot: Secondary | ICD-10-CM | POA: Diagnosis not present

## 2024-02-13 DIAGNOSIS — M25551 Pain in right hip: Secondary | ICD-10-CM | POA: Insufficient documentation

## 2024-02-13 NOTE — Therapy (Signed)
 OUTPATIENT PHYSICAL THERAPY DAILY NOTE   Patient Name: [name] MRN: [id number] DOB:[birth year], 83 y.o., female Today's Date: 02/13/2024  END OF SESSION:  PT End of Session - 02/13/24 1020     Visit Number 2    Number of Visits 16    Date for PT Re-Evaluation 04/02/24    Authorization Type MCR    Progress Note Due on Visit 10    PT Start Time 1018    PT Stop Time 1100    PT Time Calculation (min) 42 min    Activity Tolerance Patient tolerated treatment well    Behavior During Therapy WFL for tasks assessed/performed             Past Medical History:  Diagnosis Date   Allergy    Anal or rectal pain    sometimes   Anemia    hx of during pregnancy   Anxiety    Arthritis    Asthma    hx of   Bradycardia    " I KNOW I HAVE BRADYCARDIA ESPECIALLY WHEN I SLEEP"    Carotid artery occlusion    Cataract 2021   bilateral eyes   Chronic back pain    Degenerative joint disease    osteo   Depression    Diabetes mellitus without complication (HCC)    DM type II   Diverticulosis 2003   Dysrhythmia    hx of  due to eye drop and also low heart rate 40's per pt. Dr. Katheryne Pane follows   Elevated total protein    Esophageal dysmotility    Fibromyalgia    GERD (gastroesophageal reflux disease)    subsequent Nissen Fundoplication   Glaucoma    Hearing loss    Heart murmur    "was told she had a heart murmur"   Hemorrhoids    Hiatal hernia 11/08/2009   Hx of adenomatous colonic polyps 07/02/2002   Hypercalcemia    Hyperlipidemia    Hyperlipidemia    Hypertension    Implantable loop recorder present 11/28/2021   Nausea    Osteoporosis    PONV (postoperative nausea and vomiting)    Rectal bleeding    from hemorrhoids.     Sleep apnea    DOES USE CPAP    Thrombocytopenia (HCC)    Varicose veins of left lower extremity    Past Surgical History:  Procedure Laterality Date   CARDIAC CATHETERIZATION  03/14/1992   Normal cardiac cath. Normal LV function.    CARDIOVASCULAR STRESS TEST  01/22/2011   No scintigraphic evidence of inducible ischemia.   CAROTID DOPPLER  03/31/2007   Bilateral ICAs - no evidence of significant diameter reduction, dissectin, tortuosity, FMD, or any other vascular abnormality.   CATARACT EXTRACTION, BILATERAL     ESOPHAGEAL MANOMETRY N/A 11/14/2015   Procedure: ESOPHAGEAL MANOMETRY (EM);  Surgeon: Sergio Dandy, MD;  Location: [facility];  Service: Endoscopy;  Laterality: N/A;   ESOPHAGEAL MANOMETRY N/A 01/18/2021   Procedure: ESOPHAGEAL MANOMETRY (EM);  Surgeon: Sergio Dandy, MD;  Location: [facility];  Service: Endoscopy;  Laterality: N/A;   ESOPHAGOGASTRODUODENOSCOPY N/A 03/08/2021   Procedure: ESOPHAGOGASTRODUODENOSCOPY (EGD);  Surgeon: Hilarie Lovely, MD;  Location: [facility] OR;  Service: Thoracic;  Laterality: N/A;   EYE SURGERY     bilateral cataracts; bilateral stents   GASTRIC RESECTION  2009   GLAUCOMA SURGERY     JOINT REPLACEMENT     right knee Dr. France Ina 06-23-18   KNEE SURGERY Bilateral  NISSEN FUNDOPLICATION  2000   with subsequent takedown in 2009   TOTAL KNEE ARTHROPLASTY Left 06/10/2017   Procedure: LEFT  TOTAL KNEE ARTHROPLASTY;  Surgeon: Liliane Rei, MD;  Location: [facility];  Service: Orthopedics;  Laterality: Left;  Adductor Block   TOTAL KNEE ARTHROPLASTY Right 06/23/2018   Procedure: RIGHT TOTAL KNEE ARTHROPLASTY;  Surgeon: Liliane Rei, MD;  Location: [facility];  Service: Orthopedics;  Laterality: Right;   TRANSCAROTID ARTERY REVASCULARIZATION  Right 08/09/2023   Procedure: TRANSCAROTID ARTERY REVASCULARIZATION;  Surgeon: Carlene Che, MD;  Location: [facility] OR;  Service: Vascular;  Laterality: Right;   TRANSTHORACIC ECHOCARDIOGRAM  12/21/2010   EF 60%, moderate LVH,    TUBAL LIGATION     XI ROBOTIC ASSISTED HIATAL HERNIA REPAIR N/A 03/08/2021   Procedure: XI ROBOTIC ASSISTED LAPAROSCOPY WITH LYSIS OF ADHESIONS;  Surgeon: Hilarie Lovely, MD;  Location: [facility] OR;  Service: Thoracic;   Laterality: N/A;   Patient Active Problem List   Diagnosis Date Noted   Closed nondisplaced fracture of fifth metatarsal bone of left foot 08/07/2023   Stroke (HCC) 08/07/2023   Carotid stenosis, asymptomatic, right 08/07/2023   CVA (cerebral vascular accident) (HCC) 08/06/2023   Syncope 10/22/2022   Acute renal failure superimposed on stage 3a chronic kidney disease (HCC) 05/22/2022   AKI (acute kidney injury) (HCC) 05/21/2022   Hiatal hernia 03/08/2021   Heartburn    Ineffective esophageal motility    Dyspnea on exertion 07/24/2020   Change in bowel habits 08/22/2018   Knee stiff 07/23/2018   Degeneration of lumbar intervertebral disc 02/04/2018   Scoliosis of lumbar spine 02/04/2018   Sinus bradycardia 07/27/2016   Cough    Essential hypertension 11/24/2014   Gonalgia 05/13/2013   OA (osteoarthritis) of knee 05/13/2013   Degenerative joint disease involving multiple joints 01/19/2013   Rheumatoid arthritis (HCC) 01/19/2013   Thyroid  nodule 06/11/2012   Elevated total protein    Cyst of thyroid  10/29/2011   Hemorrhoids, internal 05/28/2011   Acid reflux 02/14/2011   Avitaminosis D 02/13/2011   Glaucoma 11/28/2010   HLD (hyperlipidemia) 12/14/2009   Internal and external bleeding hemorrhoids 12/14/2009   DIVERTICULOSIS, COLON 12/14/2009   Joint pain 12/14/2009   BACK PAIN, CHRONIC 12/14/2009   FIBROMYALGIA 12/14/2009   OSTEOPOROSIS 12/14/2009   Sleep apnea 12/14/2009   History of colonic polyps 12/14/2009   GASTRIC POLYP, HX OF 12/14/2009   Diaphragmatic hernia 10/04/2009   Depression 09/29/2009   REFLUX ESOPHAGITIS 09/29/2009   GERD 09/29/2009   Constipation 09/29/2009   DYSPHAGIA 09/29/2009    PCP: Nohemi Batters, MD  REFERRING PROVIDER: Jennefer Moats, DPM  REFERRING DIAG: 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  EVAL: Patient presents to PT with history of slow healing 5th metatarsal fracture after a fall about 6 months ago. She states that she doesn't have a clear memory of the fall. "The doctors thought that I had a stroke. I went to lock the door and the next thing that I remember is that I was on the floor and partially on the end  table."  She states that she is still using the bone stimulator as instructed by referring provider. She is now able to wean from CAM boot. She states that she started walking to the bathroom without the boot at home. Today is her first day ambulating outside of the house without the CAM boot. She additionally reports hip and back pain over the past several months while ambulating with CAM boot and rollator.      PERTINENT HISTORY: Relevant PMHx includes anxiety, arthritis, bradycardia, carotid artery occlusion, depression, DM type II, fibromyalgia, glaucoma, hearing loss, HTN, CVA, carotid stenosis, scoliosis of lumbar spine   PAIN:  Are you having pain?  Yes: NPRS scale: 3/10 current, 10/10 (occasionally, nerve pain) Pain location: L foot (lateral aspect), hips, lower back   Pain description: dull ache  Aggravating factors: weight bearing Relieving factors: sitting, laying down  PRECAUTIONS: Fall  RED FLAGS: None   WEIGHT BEARING RESTRICTIONS: Yes WBAT, wean/discontinue CAM boot and transition to regular shoes  FALLS:  Has patient fallen in last 6 months? Yes. Number of falls 1, see MOI   LIVING ENVIRONMENT: Lives with: lives with their spouse Lives in: House/apartment Stairs: Yes: two external steps with railing  Has following equipment  at home: Single point cane and Walker - 4 wheeled for community ambulation   OCCUPATION: n/a   PLOF: Independent return to pain-free household mobility, ambulation, household chores   PATIENT GOALS: To return to ambulating with regular shoes and cane; address pain in foot, hip/back  NEXT MD VISIT: 03/30/2024  OBJECTIVE:  Note: Objective measures were completed at Evaluation unless otherwise noted.  DIAGNOSTIC FINDINGS:   Per referring provider office note (01/27/2024): "Xrays reviewed. Acute fracture noted to proximal fifth metatarsal noted through lateral half of bone just distal to tarsometatarsal joint. Fracture line appears to be nearly completely healed."   PATIENT SURVEYS:  LEFS 22/80  COGNITION: Overall cognitive status: Within functional limits for tasks assessed     SENSATION: Not tested  PALPATION: Moderate tenderness to palpation and proximal end of 5th metatarsal  LUMBAR ROM:   Active  A/PROM  eval  Flexion 80%  Extension 30%, back and hip pain reported  Right lateral flexion 75%  Left lateral flexion 75%  Right rotation 30%, stiffness  Left rotation 40%, stiffness   (Blank rows = not tested)   LOWER EXTREMITY ROM:  Active ROM Right eval Left eval  Hip flexion    Hip extension    Hip abduction    Hip adduction    Hip internal rotation    Hip external rotation    Knee flexion    Knee extension    Ankle dorsiflexion 10 -5  Ankle plantarflexion 35 28  Ankle inversion 16 14  Ankle eversion 15 3   (Blank rows = not tested)  LOWER EXTREMITY MMT:   MMT Right eval Left eval  Hip flexion    Hip extension    Hip abduction    Hip adduction    Hip internal rotation    Hip external rotation    Knee flexion    Knee extension    Ankle dorsiflexion    Ankle plantarflexion    Ankle inversion    Ankle eversion     (Blank rows = not tested)   FUNCTIONAL TESTS:  5 times sit to stand: 16 seconds with UE, reports burning in foot.    GAIT: Distance walked: 30 ft from lobby to evaluation area Assistive device utilized: Environmental consultant - 4 wheeled  Level of assistance: Complete Independence Comments: antalgic gait pattern with decreased L stance time                                                                                                                                TREATMENT DATE:    [facility] Adult PT Treatment  02/13/2024:  Therapeutic Exercise:  nu-step L2 67m while taking subjective and planning session with patient Towel scrunch - 1'x2 Inversion/eversion towel slide 1' ea Ankle 4 way with GTB - 15x ea Supine clam with GTB - 2x10 Hip adduction squeeze - 2x10 Bridge - small arc - 2x10       PATIENT EDUCATION:  Education details: discussed benefit of sock aid for compression sock. reviewed initial home exercise program; discussion of POC, prognosis and goals for skilled PT   Person educated: Patient Education method: Explanation, Demonstration, and Handouts Education comprehension: verbalized understanding, returned demonstration, and needs further education  HOME EXERCISE PROGRAM: Access Code: [email or web address] URL: [email or web address] Date: 02/13/2024 Prepared by: Lesleigh Rash  Program Notes Remember to look for a "Sock Aid" to help with putting on your compression socks.   Exercises - Supine Active Ankle Pumps  - 1 x daily - 7 x weekly - 2 sets - 10 reps - Ankle Inversion Eversion Towel Slide  - 1 x daily - 7 x weekly - 2 sets - 10 reps - Seated Toe Curl  - 1 x daily - 7 x weekly - 2 sets - 10 reps - Toe Spreading  - 1 x daily - 7 x weekly - 2 sets - 10 reps - Hooklying Clamshell with Resistance  - 1 x daily - 4-7 x weekly - 3 sets - 10 reps - Supine Hip Adduction Isometric with Ball  - 1 x daily - 4-7 x weekly - 2 sets - 10 reps - 10'' hold - Supine Bridge  - 1 x daily - 4-7 x weekly - 2 sets - 10 reps - 2-3 seconds hold  ASSESSMENT:  CLINICAL IMPRESSION:  02/13/2024: [name] tolerated  session well with no adverse reaction.  We concentrated on ankle mobility and basic strengthening.  Added some intro hip and core strengthening exercises and updated HEP.  Pt reports fatigue but no increase in pain following visit.  EVAL: [name] is a 83 y.o. female who was seen today for physical therapy evaluation and treatment for Left Foot Pain with mobility deficits, related to slow healing (>6 month) 5th metatarsal fracture. She is demonstrating decreased L ankle AROM, decreased L ankle MMT, altered gait mechanics, and decreased STS time. She has developed BIL hip and low back pain that is likely related to extended time ambulating with CAM boot, which she is currently weaning from. She has related pain and difficulty with walking, ADLs/IADLs. She also remains at risk for falls, and requires patient education and PT intervention to decrease risk of falls. She requires skilled PT services at this time to address  relevant deficits and improve overall function.     OBJECTIVE IMPAIRMENTS: Abnormal gait, decreased activity tolerance, decreased balance, decreased mobility, difficulty walking, decreased ROM, decreased strength, increased edema, and pain.   ACTIVITY LIMITATIONS: standing, squatting, and stairs  PARTICIPATION LIMITATIONS: meal prep, cleaning, laundry, shopping, and community activity  PERSONAL FACTORS: Age, Time since onset of injury/illness/exacerbation, and 3+ comorbidities: Relevant PMHx includes anxiety, arthritis, bradycardia, carotid artery occlusion, depression, DM type II, fibromyalgia, glaucoma, hearing loss, HTN, CVA, carotid stenosis, scoliosis of lumbar spine  are also affecting patient's functional outcome.   REHAB POTENTIAL: Fair    CLINICAL DECISION MAKING: Evolving/moderate complexity  EVALUATION COMPLEXITY: Moderate   GOALS: Goals reviewed with patient? YES  SHORT TERM GOALS: Target date: 03/05/2024  Patient will be independent with initial home program at least 3  days/week.  Baseline: provided at eval Goal Status: INITIAL   2.  Patient will demonstrate complete 5 time Sit-to-Stand test in 14 seconds or less in order to demonstrate decreasing risk of falls.  Baseline: see objective measures Goal Status: INITIAL   3.  Patient will demonstrate improved L ankle DF AROM to at least neutral (0 degrees).  Baseline: lacking 5 degrees of DF Goal status: INITIAL   LONG TERM GOALS: Target date: 04/02/2024  Patient will report improved overall functional ability with LEFS score of 60/80 or greater.  Baseline: 22/80 Goal Status: INITIAL   2.  Patient will return to safe ambulation with [facility] for household and community ambulation.  Baseline: ambulating with antalgic gait with rollator  Goal status: INITIAL  3.  Patient will demonstrate at least 4+/5 MMT with LQ testing in L LE. Baseline: see objective measures  Goal status: INITIAL  4.  Patient will report ability to perform household chores and ADLs with no greater than 6/38 foot pain.  Baseline: up to 10/10 pain; moderate-to-severe difficulty perform activities around her home  Goal status: INITIAL     PLAN:  PT FREQUENCY: 1-2x/week  PT DURATION: 8 weeks  PLANNED INTERVENTIONS: 97164- PT Re-evaluation, 97750- Physical Performance Testing, 97110-Therapeutic exercises, 97530- Therapeutic activity, [id number]- Neuromuscular re-education, 97535- Self Care, 75643- Manual therapy, [phone]- Gait training, [phone]- Aquatic Therapy, [phone]- Vasopneumatic device, Patient/Family education, Balance training, Stair training, Taping, Joint mobilization, Cryotherapy, and Moist heat  PLAN FOR NEXT SESSION:  assess LBP and relevant obj measures; FGA or DGI when appropriate; progress towards established goals (to include ankle AROM, ankle strengthening, knee/hip strengthening, weight bearing, balance, gait training, progress to [facility] when appropriate, low-impact aerobic activity, manual therapy as indicated, other modalities as  indicated).    Marena Witts E Carlen Fils PT 02/13/2024 11:17 AM

## 2024-02-19 ENCOUNTER — Ambulatory Visit: Admitting: Physical Therapy

## 2024-02-19 ENCOUNTER — Encounter: Payer: Self-pay | Admitting: Physical Therapy

## 2024-02-19 DIAGNOSIS — M5416 Radiculopathy, lumbar region: Secondary | ICD-10-CM | POA: Diagnosis not present

## 2024-02-19 DIAGNOSIS — R262 Difficulty in walking, not elsewhere classified: Secondary | ICD-10-CM

## 2024-02-19 DIAGNOSIS — M25552 Pain in left hip: Secondary | ICD-10-CM | POA: Diagnosis not present

## 2024-02-19 DIAGNOSIS — M25572 Pain in left ankle and joints of left foot: Secondary | ICD-10-CM | POA: Diagnosis not present

## 2024-02-19 DIAGNOSIS — M25551 Pain in right hip: Secondary | ICD-10-CM | POA: Diagnosis not present

## 2024-02-19 NOTE — Therapy (Signed)
 OUTPATIENT PHYSICAL THERAPY DAILY NOTE   Patient Name: [name] MRN: [id number] DOB:[birth year], 83 y.o., female Today's Date: 02/19/2024  END OF SESSION:  PT End of Session - 02/19/24 1145     Visit Number 3    Number of Visits 16    Date for PT Re-Evaluation 04/02/24    Authorization Type MCR    Progress Note Due on Visit 10    PT Start Time 1145    PT Stop Time 1226    PT Time Calculation (min) 41 min    Activity Tolerance Patient tolerated treatment well    Behavior During Therapy WFL for tasks assessed/performed             Past Medical History:  Diagnosis Date   Allergy    Anal or rectal pain    sometimes   Anemia    hx of during pregnancy   Anxiety    Arthritis    Asthma    hx of   Bradycardia    " I KNOW I HAVE BRADYCARDIA ESPECIALLY WHEN I SLEEP"    Carotid artery occlusion    Cataract 2021   bilateral eyes   Chronic back pain    Degenerative joint disease    osteo   Depression    Diabetes mellitus without complication (HCC)    DM type II   Diverticulosis 2003   Dysrhythmia    hx of  due to eye drop and also low heart rate 40's per pt. Dr. Katheryne Pane follows   Elevated total protein    Esophageal dysmotility    Fibromyalgia    GERD (gastroesophageal reflux disease)    subsequent Nissen Fundoplication   Glaucoma    Hearing loss    Heart murmur    "was told she had a heart murmur"   Hemorrhoids    Hiatal hernia 11/08/2009   Hx of adenomatous colonic polyps 07/02/2002   Hypercalcemia    Hyperlipidemia    Hyperlipidemia    Hypertension    Implantable loop recorder present 11/28/2021   Nausea    Osteoporosis    PONV (postoperative nausea and vomiting)    Rectal bleeding    from hemorrhoids.     Sleep apnea    DOES USE CPAP    Thrombocytopenia (HCC)    Varicose veins of left lower extremity    Past Surgical History:  Procedure Laterality Date   CARDIAC CATHETERIZATION  03/14/1992   Normal cardiac cath. Normal LV function.    CARDIOVASCULAR STRESS TEST  01/22/2011   No scintigraphic evidence of inducible ischemia.   CAROTID DOPPLER  03/31/2007   Bilateral ICAs - no evidence of significant diameter reduction, dissectin, tortuosity, FMD, or any other vascular abnormality.   CATARACT EXTRACTION, BILATERAL     ESOPHAGEAL MANOMETRY N/A 11/14/2015   Procedure: ESOPHAGEAL MANOMETRY (EM);  Surgeon: Sergio Dandy, MD;  Location: [facility];  Service: Endoscopy;  Laterality: N/A;   ESOPHAGEAL MANOMETRY N/A 01/18/2021   Procedure: ESOPHAGEAL MANOMETRY (EM);  Surgeon: Sergio Dandy, MD;  Location: [facility];  Service: Endoscopy;  Laterality: N/A;   ESOPHAGOGASTRODUODENOSCOPY N/A 03/08/2021   Procedure: ESOPHAGOGASTRODUODENOSCOPY (EGD);  Surgeon: Hilarie Lovely, MD;  Location: [facility] OR;  Service: Thoracic;  Laterality: N/A;   EYE SURGERY     bilateral cataracts; bilateral stents   GASTRIC RESECTION  2009   GLAUCOMA SURGERY     JOINT REPLACEMENT     right knee Dr. France Ina 06-23-18   KNEE SURGERY Bilateral  NISSEN FUNDOPLICATION  2000   with subsequent takedown in 2009   TOTAL KNEE ARTHROPLASTY Left 06/10/2017   Procedure: LEFT  TOTAL KNEE ARTHROPLASTY;  Surgeon: Liliane Rei, MD;  Location: [facility];  Service: Orthopedics;  Laterality: Left;  Adductor Block   TOTAL KNEE ARTHROPLASTY Right 06/23/2018   Procedure: RIGHT TOTAL KNEE ARTHROPLASTY;  Surgeon: Liliane Rei, MD;  Location: [facility];  Service: Orthopedics;  Laterality: Right;   TRANSCAROTID ARTERY REVASCULARIZATION  Right 08/09/2023   Procedure: TRANSCAROTID ARTERY REVASCULARIZATION;  Surgeon: Carlene Che, MD;  Location: [facility] OR;  Service: Vascular;  Laterality: Right;   TRANSTHORACIC ECHOCARDIOGRAM  12/21/2010   EF 60%, moderate LVH,    TUBAL LIGATION     XI ROBOTIC ASSISTED HIATAL HERNIA REPAIR N/A 03/08/2021   Procedure: XI ROBOTIC ASSISTED LAPAROSCOPY WITH LYSIS OF ADHESIONS;  Surgeon: Hilarie Lovely, MD;  Location: [facility] OR;  Service: Thoracic;   Laterality: N/A;   Patient Active Problem List   Diagnosis Date Noted   Closed nondisplaced fracture of fifth metatarsal bone of left foot 08/07/2023   Stroke (HCC) 08/07/2023   Carotid stenosis, asymptomatic, right 08/07/2023   CVA (cerebral vascular accident) (HCC) 08/06/2023   Syncope 10/22/2022   Acute renal failure superimposed on stage 3a chronic kidney disease (HCC) 05/22/2022   AKI (acute kidney injury) (HCC) 05/21/2022   Hiatal hernia 03/08/2021   Heartburn    Ineffective esophageal motility    Dyspnea on exertion 07/24/2020   Change in bowel habits 08/22/2018   Knee stiff 07/23/2018   Degeneration of lumbar intervertebral disc 02/04/2018   Scoliosis of lumbar spine 02/04/2018   Sinus bradycardia 07/27/2016   Cough    Essential hypertension 11/24/2014   Gonalgia 05/13/2013   OA (osteoarthritis) of knee 05/13/2013   Degenerative joint disease involving multiple joints 01/19/2013   Rheumatoid arthritis (HCC) 01/19/2013   Thyroid  nodule 06/11/2012   Elevated total protein    Cyst of thyroid  10/29/2011   Hemorrhoids, internal 05/28/2011   Acid reflux 02/14/2011   Avitaminosis D 02/13/2011   Glaucoma 11/28/2010   HLD (hyperlipidemia) 12/14/2009   Internal and external bleeding hemorrhoids 12/14/2009   DIVERTICULOSIS, COLON 12/14/2009   Joint pain 12/14/2009   BACK PAIN, CHRONIC 12/14/2009   FIBROMYALGIA 12/14/2009   OSTEOPOROSIS 12/14/2009   Sleep apnea 12/14/2009   History of colonic polyps 12/14/2009   GASTRIC POLYP, HX OF 12/14/2009   Diaphragmatic hernia 10/04/2009   Depression 09/29/2009   REFLUX ESOPHAGITIS 09/29/2009   GERD 09/29/2009   Constipation 09/29/2009   DYSPHAGIA 09/29/2009    PCP: Nohemi Batters, MD  REFERRING PROVIDER: Jennefer Moats, DPM  REFERRING DIAG: 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  She reports increased knee and low back pain since last visit.  EVAL: Patient presents to PT with history of slow healing 5th metatarsal fracture after a fall about 6 months ago. She states that she doesn't have a clear memory of the fall. "The doctors thought that I had a stroke. I went to lock the door and the next thing that I remember is that I  was on the floor and partially on the end table."  She states that she is still using the bone stimulator as instructed by referring provider. She is now able to wean from CAM boot. She states that she started walking to the bathroom without the boot at home. Today is her first day ambulating outside of the house without the CAM boot. She additionally reports hip and back pain over the past several months while ambulating with CAM boot and rollator.      PERTINENT HISTORY: Relevant PMHx includes anxiety, arthritis, bradycardia, carotid artery occlusion, depression, DM type II, fibromyalgia, glaucoma, hearing loss, HTN, CVA, carotid stenosis, scoliosis of lumbar spine   PAIN:  Are you having pain?  Yes: NPRS scale: 3/10 current, 10/10 (occasionally, nerve pain) Pain location: L foot (lateral aspect), hips, lower back   Pain description: dull ache  Aggravating factors: weight bearing Relieving factors: sitting, laying down  PRECAUTIONS: Fall  RED FLAGS: None   WEIGHT BEARING RESTRICTIONS: Yes WBAT, wean/discontinue CAM boot and transition to regular shoes  FALLS:  Has patient fallen in last 6 months? Yes. Number of falls 1, see MOI   LIVING ENVIRONMENT: Lives with: lives with their spouse Lives in: House/apartment Stairs: Yes: two  external steps with railing  Has following equipment at home: Single point cane and Walker - 4 wheeled for community ambulation   OCCUPATION: n/a   PLOF: Independent return to pain-free household mobility, ambulation, household chores   PATIENT GOALS: To return to ambulating with regular shoes and cane; address pain in foot, hip/back  NEXT MD VISIT: 03/30/2024  OBJECTIVE:  Note: Objective measures were completed at Evaluation unless otherwise noted.  DIAGNOSTIC FINDINGS:   Per referring provider office note (01/27/2024): "Xrays reviewed. Acute fracture noted to proximal fifth metatarsal noted through lateral half of bone just distal to tarsometatarsal joint. Fracture line appears to be nearly completely healed."   PATIENT SURVEYS:  LEFS 22/80  COGNITION: Overall cognitive status: Within functional limits for tasks assessed     SENSATION: Not tested  PALPATION: Moderate tenderness to palpation and proximal end of 5th metatarsal  LUMBAR ROM:   Active  A/PROM  eval  Flexion 80%  Extension 30%, back and hip pain reported  Right lateral flexion 75%  Left lateral flexion 75%  Right rotation 30%, stiffness  Left rotation 40%, stiffness   (Blank rows = not tested)   LOWER EXTREMITY ROM:  Active ROM Right eval Left eval  Hip flexion    Hip extension    Hip abduction    Hip adduction    Hip internal rotation    Hip external rotation    Knee flexion    Knee extension    Ankle dorsiflexion 10 -5  Ankle plantarflexion 35 28  Ankle inversion 16 14  Ankle eversion 15 3   (Blank rows = not tested)  LOWER EXTREMITY MMT:   MMT Right eval Left eval  Hip flexion    Hip extension    Hip abduction    Hip adduction    Hip internal rotation    Hip external rotation    Knee flexion    Knee extension    Ankle dorsiflexion    Ankle plantarflexion    Ankle inversion    Ankle eversion     (Blank rows = not tested)   FUNCTIONAL TESTS:  5 times sit to stand: 16  seconds with UE, reports burning in foot.   GAIT: Distance walked: 30 ft from lobby to  evaluation area Assistive device utilized: Walker - 4 wheeled Level of assistance: Complete Independence Comments: antalgic gait pattern with decreased L stance time                                                                                                                                TREATMENT DATE:    [facility] Adult PT Treatment  02/13/2024:  Therapeutic Exercise:  nu-step L2 21m while taking subjective and planning session with patient BAPS board L 3  - sagital and coronal  Ankle 4 way with Blue TB - 15x ea Supine clam with GTB - 2x10 Bil SAQ - 2# - 3x10 Hip adduction squeeze - 2x10  Seated heel raise with 2#     PATIENT EDUCATION:  Education details: discussed benefit of sock aid for compression sock. reviewed initial home exercise program; discussion of POC, prognosis and goals for skilled PT   Person educated: Patient Education method: Explanation, Demonstration, and Handouts Education comprehension: verbalized understanding, returned demonstration, and needs further education  HOME EXERCISE PROGRAM: Access Code: [email or web address] URL: [email or web address] Date: 02/13/2024 Prepared by: Lesleigh Rash  Program Notes Remember to look for a "Sock Aid" to help with putting on your compression socks.   Exercises - Supine Active Ankle Pumps  - 1 x daily - 7 x weekly - 2 sets - 10 reps - Ankle Inversion Eversion Towel Slide  - 1 x daily - 7 x weekly - 2 sets - 10 reps - Seated Toe Curl  - 1 x daily - 7 x weekly - 2 sets - 10 reps - Toe Spreading  - 1 x daily - 7 x weekly - 2 sets - 10 reps - Hooklying Clamshell with Resistance  - 1 x daily - 4-7 x weekly - 3 sets - 10 reps - Supine Hip Adduction Isometric with Ball  - 1 x daily - 4-7 x weekly - 2 sets - 10 reps - 10'' hold - Supine Bridge  - 1 x daily - 4-7 x weekly - 2 sets - 10 reps - 2-3 seconds  hold  ASSESSMENT:  CLINICAL IMPRESSION:  02/19/2024: Moneka tolerated session well with no adverse reaction.  We concentrated on ankle mobility with introduction of BAPS board today.  Pt does well with DF/PF but shows less control on coronal plane.  Tactile feedback provided to minimize knee/hip contribution during BAPS board movements.  Dropped bridge today d/t pt complaint of increased back pain following last visit.  Banded ankle 4 way still challenging mainly d/t weakness.  EVAL: Haneefah is a 83 y.o. female who was seen today for physical therapy evaluation and treatment for Left Foot Pain with mobility deficits, related to slow healing (>6 month) 5th metatarsal fracture. She is demonstrating decreased L ankle AROM, decreased L ankle MMT, altered gait mechanics, and decreased STS time. She has developed BIL hip and low back pain that is likely related to extended time ambulating with CAM boot,  which she is currently weaning from. She has related pain and difficulty with walking, ADLs/IADLs. She also remains at risk for falls, and requires patient education and PT intervention to decrease risk of falls. She requires skilled PT services at this time to address relevant deficits and improve overall function.     OBJECTIVE IMPAIRMENTS: Abnormal gait, decreased activity tolerance, decreased balance, decreased mobility, difficulty walking, decreased ROM, decreased strength, increased edema, and pain.   ACTIVITY LIMITATIONS: standing, squatting, and stairs  PARTICIPATION LIMITATIONS: meal prep, cleaning, laundry, shopping, and community activity  PERSONAL FACTORS: Age, Time since onset of injury/illness/exacerbation, and 3+ comorbidities: Relevant PMHx includes anxiety, arthritis, bradycardia, carotid artery occlusion, depression, DM type II, fibromyalgia, glaucoma, hearing loss, HTN, CVA, carotid stenosis, scoliosis of lumbar spine  are also affecting patient's functional outcome.   REHAB POTENTIAL: Fair     CLINICAL DECISION MAKING: Evolving/moderate complexity  EVALUATION COMPLEXITY: Moderate   GOALS: Goals reviewed with patient? YES  SHORT TERM GOALS: Target date: 03/05/2024  Patient will be independent with initial home program at least 3 days/week.  Baseline: provided at eval Goal Status: INITIAL   2.  Patient will demonstrate complete 5 time Sit-to-Stand test in 14 seconds or less in order to demonstrate decreasing risk of falls.  Baseline: see objective measures Goal Status: INITIAL   3.  Patient will demonstrate improved L ankle DF AROM to at least neutral (0 degrees).  Baseline: lacking 5 degrees of DF Goal status: INITIAL   LONG TERM GOALS: Target date: 04/02/2024  Patient will report improved overall functional ability with LEFS score of 60/80 or greater.  Baseline: 22/80 Goal Status: INITIAL   2.  Patient will return to safe ambulation with [facility] for household and community ambulation.  Baseline: ambulating with antalgic gait with rollator  Goal status: INITIAL  3.  Patient will demonstrate at least 4+/5 MMT with LQ testing in L LE. Baseline: see objective measures  Goal status: INITIAL  4.  Patient will report ability to perform household chores and ADLs with no greater than 1/61 foot pain.  Baseline: up to 10/10 pain; moderate-to-severe difficulty perform activities around her home  Goal status: INITIAL     PLAN:  PT FREQUENCY: 1-2x/week  PT DURATION: 8 weeks  PLANNED INTERVENTIONS: [id number]- PT Re-evaluation, 97750- Physical Performance Testing, 97110-Therapeutic exercises, 97530- Therapeutic activity, [id number]- Neuromuscular re-education, 97535- Self Care, [id number]- Manual therapy, [phone]- Gait training, [phone]- Aquatic Therapy, [phone]- Vasopneumatic device, Patient/Family education, Balance training, Stair training, Taping, Joint mobilization, Cryotherapy, and Moist heat  PLAN FOR NEXT SESSION:  assess LBP and relevant obj measures; FGA or DGI when appropriate;  progress towards established goals (to include ankle AROM, ankle strengthening, knee/hip strengthening, weight bearing, balance, gait training, progress to [facility] when appropriate, low-impact aerobic activity, manual therapy as indicated, other modalities as indicated).    Art Levan E Alanii Ramer PT 02/19/2024 12:30 PM

## 2024-02-21 ENCOUNTER — Ambulatory Visit: Admitting: Physical Therapy

## 2024-02-24 NOTE — Therapy (Signed)
 OUTPATIENT PHYSICAL THERAPY DAILY NOTE   Patient Name: [name] MRN: [id number] DOB:04-01-1941, 83 y.o., female Today's Date: 02/25/2024  END OF SESSION:  PT End of Session - 02/25/24 1001     Visit Number 4    Number of Visits 16    Date for PT Re-Evaluation 04/02/24    Authorization Type MCR    Progress Note Due on Visit 10    PT Start Time 1001    PT Stop Time 1040    PT Time Calculation (min) 39 min    Activity Tolerance Patient tolerated treatment well              Past Medical History:  Diagnosis Date   Allergy    Anal or rectal pain    sometimes   Anemia    hx of during pregnancy   Anxiety    Arthritis    Asthma    hx of   Bradycardia    " I KNOW I HAVE BRADYCARDIA ESPECIALLY WHEN I SLEEP"    Carotid artery occlusion    Cataract 2021   bilateral eyes   Chronic back pain    Degenerative joint disease    osteo   Depression    Diabetes mellitus without complication (HCC)    DM type II   Diverticulosis 2003   Dysrhythmia    hx of  due to eye drop and also low heart rate 40's per pt. Dr. Katheryne Pane follows   Elevated total protein    Esophageal dysmotility    Fibromyalgia    GERD (gastroesophageal reflux disease)    subsequent Nissen Fundoplication   Glaucoma    Hearing loss    Heart murmur    "was told she had a heart murmur"   Hemorrhoids    Hiatal hernia 11/08/2009   Hx of adenomatous colonic polyps 07/02/2002   Hypercalcemia    Hyperlipidemia    Hyperlipidemia    Hypertension    Implantable loop recorder present 11/28/2021   Nausea    Osteoporosis    PONV (postoperative nausea and vomiting)    Rectal bleeding    from hemorrhoids.     Sleep apnea    DOES USE CPAP    Thrombocytopenia (HCC)    Varicose veins of left lower extremity    Past Surgical History:  Procedure Laterality Date   CARDIAC CATHETERIZATION  03/14/1992   Normal cardiac cath. Normal LV function.   CARDIOVASCULAR STRESS TEST  01/22/2011   No scintigraphic evidence of  inducible ischemia.   CAROTID DOPPLER  03/31/2007   Bilateral ICAs - no evidence of significant diameter reduction, dissectin, tortuosity, FMD, or any other vascular abnormality.   CATARACT EXTRACTION, BILATERAL     ESOPHAGEAL MANOMETRY N/A 11/14/2015   Procedure: ESOPHAGEAL MANOMETRY (EM);  Surgeon: Sergio Dandy, MD;  Location: [facility];  Service: Endoscopy;  Laterality: N/A;   ESOPHAGEAL MANOMETRY N/A 01/18/2021   Procedure: ESOPHAGEAL MANOMETRY (EM);  Surgeon: Sergio Dandy, MD;  Location: [facility];  Service: Endoscopy;  Laterality: N/A;   ESOPHAGOGASTRODUODENOSCOPY N/A 03/08/2021   Procedure: ESOPHAGOGASTRODUODENOSCOPY (EGD);  Surgeon: Hilarie Lovely, MD;  Location: [facility] OR;  Service: Thoracic;  Laterality: N/A;   EYE SURGERY     bilateral cataracts; bilateral stents   GASTRIC RESECTION  2009   GLAUCOMA SURGERY     JOINT REPLACEMENT     right knee Dr. France Ina 06-23-18   KNEE SURGERY Bilateral    NISSEN FUNDOPLICATION  2000   with subsequent  takedown in 2009   TOTAL KNEE ARTHROPLASTY Left 06/10/2017   Procedure: LEFT  TOTAL KNEE ARTHROPLASTY;  Surgeon: Liliane Rei, MD;  Location: [facility];  Service: Orthopedics;  Laterality: Left;  Adductor Block   TOTAL KNEE ARTHROPLASTY Right 06/23/2018   Procedure: RIGHT TOTAL KNEE ARTHROPLASTY;  Surgeon: Liliane Rei, MD;  Location: [facility];  Service: Orthopedics;  Laterality: Right;   TRANSCAROTID ARTERY REVASCULARIZATION  Right 08/09/2023   Procedure: TRANSCAROTID ARTERY REVASCULARIZATION;  Surgeon: Carlene Che, MD;  Location: [facility] OR;  Service: Vascular;  Laterality: Right;   TRANSTHORACIC ECHOCARDIOGRAM  12/21/2010   EF 60%, moderate LVH,    TUBAL LIGATION     XI ROBOTIC ASSISTED HIATAL HERNIA REPAIR N/A 03/08/2021   Procedure: XI ROBOTIC ASSISTED LAPAROSCOPY WITH LYSIS OF ADHESIONS;  Surgeon: Hilarie Lovely, MD;  Location: [facility] OR;  Service: Thoracic;  Laterality: N/A;   Patient Active Problem List   Diagnosis Date  Noted   Closed nondisplaced fracture of fifth metatarsal bone of left foot 08/07/2023   Stroke (HCC) 08/07/2023   Carotid stenosis, asymptomatic, right 08/07/2023   CVA (cerebral vascular accident) (HCC) 08/06/2023   Syncope 10/22/2022   Acute renal failure superimposed on stage 3a chronic kidney disease (HCC) 05/22/2022   AKI (acute kidney injury) (HCC) 05/21/2022   Hiatal hernia 03/08/2021   Heartburn    Ineffective esophageal motility    Dyspnea on exertion 07/24/2020   Change in bowel habits 08/22/2018   Knee stiff 07/23/2018   Degeneration of lumbar intervertebral disc 02/04/2018   Scoliosis of lumbar spine 02/04/2018   Sinus bradycardia 07/27/2016   Cough    Essential hypertension 11/24/2014   Gonalgia 05/13/2013   OA (osteoarthritis) of knee 05/13/2013   Degenerative joint disease involving multiple joints 01/19/2013   Rheumatoid arthritis (HCC) 01/19/2013   Thyroid  nodule 06/11/2012   Elevated total protein    Cyst of thyroid  10/29/2011   Hemorrhoids, internal 05/28/2011   Acid reflux 02/14/2011   Avitaminosis D 02/13/2011   Glaucoma 11/28/2010   HLD (hyperlipidemia) 12/14/2009   Internal and external bleeding hemorrhoids 12/14/2009   DIVERTICULOSIS, COLON 12/14/2009   Joint pain 12/14/2009   BACK PAIN, CHRONIC 12/14/2009   FIBROMYALGIA 12/14/2009   OSTEOPOROSIS 12/14/2009   Sleep apnea 12/14/2009   History of colonic polyps 12/14/2009   GASTRIC POLYP, HX OF 12/14/2009   Diaphragmatic hernia 10/04/2009   Depression 09/29/2009   REFLUX ESOPHAGITIS 09/29/2009   GERD 09/29/2009   Constipation 09/29/2009   DYSPHAGIA 09/29/2009    PCP: Nohemi Batters, MD  REFERRING PROVIDER: Jennefer Moats, DPM  REFERRING DIAG: 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 L foot is feeling okay today, states swelling continues to fluctuate with activity. Hasn't used boot in past couple weeks, has helped with her back pain.   EVAL: Patient presents to PT with history of slow healing 5th metatarsal fracture after a fall about 6 months ago. She states that she doesn't have a clear memory of the fall. "The doctors thought that I had a stroke. I went to lock the door and the next thing that I remember is that I was on the floor  and partially on the end table."  She states that she is still using the bone stimulator as instructed by referring provider. She is now able to wean from CAM boot. She states that she started walking to the bathroom without the boot at home. Today is her first day ambulating outside of the house without the CAM boot. She additionally reports hip and back pain over the past several months while ambulating with CAM boot and rollator.      PERTINENT HISTORY: Relevant PMHx includes anxiety, arthritis, bradycardia, carotid artery occlusion, depression, DM type II, fibromyalgia, glaucoma, hearing loss, HTN, CVA, carotid stenosis, scoliosis of lumbar spine   PAIN:  Are you having pain?  Yes: NPRS scale: 3/10 current, 10/10 (occasionally, nerve pain) Pain location: L foot (lateral aspect), hips, lower back   Pain description: dull ache  Aggravating factors: weight bearing Relieving factors: sitting, laying down  PRECAUTIONS: Fall  RED FLAGS: None   WEIGHT BEARING RESTRICTIONS: Yes WBAT, wean/discontinue CAM boot and transition to regular shoes  FALLS:  Has patient fallen in last 6 months? Yes. Number of falls 1, see MOI   LIVING ENVIRONMENT: Lives with: lives with their spouse Lives in: House/apartment Stairs: Yes: two external steps with railing  Has  following equipment at home: Single point cane and Walker - 4 wheeled for community ambulation   OCCUPATION: n/a   PLOF: Independent return to pain-free household mobility, ambulation, household chores   PATIENT GOALS: To return to ambulating with regular shoes and cane; address pain in foot, hip/back  NEXT MD VISIT: 03/30/2024  OBJECTIVE:  Note: Objective measures were completed at Evaluation unless otherwise noted.  DIAGNOSTIC FINDINGS:   Per referring provider office note (01/27/2024): "Xrays reviewed. Acute fracture noted to proximal fifth metatarsal noted through lateral half of bone just distal to tarsometatarsal joint. Fracture line appears to be nearly completely healed."   PATIENT SURVEYS:  LEFS 22/80  COGNITION: Overall cognitive status: Within functional limits for tasks assessed     SENSATION: Not tested  PALPATION: Moderate tenderness to palpation and proximal end of 5th metatarsal  LUMBAR ROM:   Active  A/PROM  eval  Flexion 80%  Extension 30%, back and hip pain reported  Right lateral flexion 75%  Left lateral flexion 75%  Right rotation 30%, stiffness  Left rotation 40%, stiffness   (Blank rows = not tested)   LOWER EXTREMITY ROM:  Active ROM Right eval Left eval  Hip flexion    Hip extension    Hip abduction    Hip adduction    Hip internal rotation    Hip external rotation    Knee flexion    Knee extension    Ankle dorsiflexion 10 -5  Ankle plantarflexion 35 28  Ankle inversion 16 14  Ankle eversion 15 3   (Blank rows = not tested)  LOWER EXTREMITY MMT:   MMT Right eval Left eval  Hip flexion    Hip extension    Hip abduction    Hip adduction    Hip internal rotation    Hip external rotation    Knee flexion    Knee extension    Ankle dorsiflexion    Ankle plantarflexion    Ankle inversion    Ankle eversion     (Blank rows = not tested)   FUNCTIONAL TESTS:  5 times sit to stand: 16 seconds with UE, reports burning in  foot.   GAIT: Distance walked: 30 ft from lobby to evaluation area Assistive device  utilized: Environmental consultant - 4 wheeled Level of assistance: Complete Independence Comments: antalgic gait pattern with decreased L stance time                                                                                                                                TREATMENT DATE:   [facility] Adult PT Treatment:                                                DATE: 02/25/24 Therapeutic Exercise: AP rockerboard, sitting x12 ML rockerboard sitting x12 Seated heel/toe raises 2x15 unweighted LAQ BW x8 BIL  HEP update + education  Neuromuscular re-ed: BAPS circles x8 each way Towel scrunch limited ROM for comfort, 2x12 LLE Seated gross toe extension 2x12 LLE      [facility] Adult PT Treatment  02/13/2024:  Therapeutic Exercise:  nu-step L2 80m while taking subjective and planning session with patient BAPS board L 3  - sagital and coronal  Ankle 4 way with Blue TB - 15x ea Supine clam with GTB - 2x10 Bil SAQ - 2# - 3x10 Hip adduction squeeze - 2x10  Seated heel raise with 2#     PATIENT EDUCATION:  Education details: discussed benefit of sock aid for compression sock. reviewed initial home exercise program; discussion of POC, prognosis and goals for skilled PT   Person educated: Patient Education method: Explanation, Demonstration, and Handouts Education comprehension: verbalized understanding, returned demonstration, and needs further education  HOME EXERCISE PROGRAM: Access Code: [email or web address] URL: [email or web address] Date: 02/25/2024 Prepared by: Mayme Spearman  Program Notes Remember to look for a "Sock Aid" to help with putting on your compression socks.   Exercises - Ankle Inversion Eversion Towel Slide  - 1 x daily - 7 x weekly - 2 sets - 10 reps - Seated Toe Curl  - 1 x daily - 7 x weekly - 2 sets - 10 reps - Toe Spreading  - 1 x daily - 7 x weekly - 2 sets - 10 reps - Seated Hip  Abduction with Resistance  - 1 x daily - 4-7 x weekly - 3 sets - 10 reps - Seated Hip Adduction Isometrics with Ball  - 1 x daily - 4-7 x weekly - 1 sets - 10 reps - 10 hold - Seated Heel Raise  - 1 x daily - 7 x weekly - 2 sets - 10 reps  ASSESSMENT:  CLINICAL IMPRESSION:  02/25/2024: Pt arrives w/ baseline pain, no issues after last session. Today continuing to work on comfortable ankle mobility, difficulty controlling frontal and multi planar movements. Given this, we utilize rockerboard to break down movements into single plane, improved performance w/ this although does endorse some discomfort in lateral foot that resolves w/ rest and subsequent exercise. Tolerates session well overall, no adverse events and  no pain on departure. Pt does report an episode of passing out over weekend - denies any issues today, states these used to occur frequently but haven't occurred in quite some time, advised her to communicate w/ providers and she verbalizes agreement/understanding. Recommend continuing along current POC in order to address relevant deficits and improve functional tolerance. Pt departs today's session in no acute distress, all voiced questions/concerns addressed appropriately from PT perspective.     EVAL: Kambra is a 83 y.o. female who was seen today for physical therapy evaluation and treatment for Left Foot Pain with mobility deficits, related to slow healing (>6 month) 5th metatarsal fracture. She is demonstrating decreased L ankle AROM, decreased L ankle MMT, altered gait mechanics, and decreased STS time. She has developed BIL hip and low back pain that is likely related to extended time ambulating with CAM boot, which she is currently weaning from. She has related pain and difficulty with walking, ADLs/IADLs. She also remains at risk for falls, and requires patient education and PT intervention to decrease risk of falls. She requires skilled PT services at this time to address relevant deficits  and improve overall function.     OBJECTIVE IMPAIRMENTS: Abnormal gait, decreased activity tolerance, decreased balance, decreased mobility, difficulty walking, decreased ROM, decreased strength, increased edema, and pain.   ACTIVITY LIMITATIONS: standing, squatting, and stairs  PARTICIPATION LIMITATIONS: meal prep, cleaning, laundry, shopping, and community activity  PERSONAL FACTORS: Age, Time since onset of injury/illness/exacerbation, and 3+ comorbidities: Relevant PMHx includes anxiety, arthritis, bradycardia, carotid artery occlusion, depression, DM type II, fibromyalgia, glaucoma, hearing loss, HTN, CVA, carotid stenosis, scoliosis of lumbar spine are also affecting patient's functional outcome.   REHAB POTENTIAL: Fair    CLINICAL DECISION MAKING: Evolving/moderate complexity  EVALUATION COMPLEXITY: Moderate   GOALS: Goals reviewed with patient? YES  SHORT TERM GOALS: Target date: 03/05/2024  Patient will be independent with initial home program at least 3 days/week.  Baseline: provided at eval Goal Status: INITIAL   2.  Patient will demonstrate complete 5 time Sit-to-Stand test in 14 seconds or less in order to demonstrate decreasing risk of falls.  Baseline: see objective measures Goal Status: INITIAL   3.  Patient will demonstrate improved L ankle DF AROM to at least neutral (0 degrees).  Baseline: lacking 5 degrees of DF Goal status: INITIAL   LONG TERM GOALS: Target date: 04/02/2024  Patient will report improved overall functional ability with LEFS score of 60/80 or greater.  Baseline: 22/80 Goal Status: INITIAL   2.  Patient will return to safe ambulation with [facility] for household and community ambulation.  Baseline: ambulating with antalgic gait with rollator  Goal status: INITIAL  3.  Patient will demonstrate at least 4+/5 MMT with LQ testing in L LE. Baseline: see objective measures  Goal status: INITIAL  4.  Patient will report ability to perform  household chores and ADLs with no greater than 1/61 foot pain.  Baseline: up to 10/10 pain; moderate-to-severe difficulty perform activities around her home  Goal status: INITIAL     PLAN:  PT FREQUENCY: 1-2x/week  PT DURATION: 8 weeks  PLANNED INTERVENTIONS: [id number]- PT Re-evaluation, 97750- Physical Performance Testing, 97110-Therapeutic exercises, 97530- Therapeutic activity, [id number]- Neuromuscular re-education, 97535- Self Care, [id number]- Manual therapy, [id number]- Gait training, [phone]- Aquatic Therapy, [phone]- Vasopneumatic device, Patient/Family education, Balance training, Stair training, Taping, Joint mobilization, Cryotherapy, and Moist heat  PLAN FOR NEXT SESSION:  assess LBP and relevant obj measures; FGA or DGI when appropriate; progress towards established  goals (to include ankle AROM, ankle strengthening, knee/hip strengthening, weight bearing, balance, gait training, progress to [facility] when appropriate, low-impact aerobic activity, manual therapy as indicated, other modalities as indicated).    Lovett Ruck PT, DPT 02/25/2024 10:43 AM

## 2024-02-25 ENCOUNTER — Ambulatory Visit: Admitting: Physical Therapy

## 2024-02-25 ENCOUNTER — Encounter: Payer: Self-pay | Admitting: Physical Therapy

## 2024-02-25 DIAGNOSIS — M25552 Pain in left hip: Secondary | ICD-10-CM | POA: Diagnosis not present

## 2024-02-25 DIAGNOSIS — M5416 Radiculopathy, lumbar region: Secondary | ICD-10-CM | POA: Diagnosis not present

## 2024-02-25 DIAGNOSIS — M25572 Pain in left ankle and joints of left foot: Secondary | ICD-10-CM

## 2024-02-25 DIAGNOSIS — M25551 Pain in right hip: Secondary | ICD-10-CM | POA: Diagnosis not present

## 2024-02-25 DIAGNOSIS — R262 Difficulty in walking, not elsewhere classified: Secondary | ICD-10-CM

## 2024-02-27 ENCOUNTER — Ambulatory Visit

## 2024-02-27 DIAGNOSIS — M25572 Pain in left ankle and joints of left foot: Secondary | ICD-10-CM

## 2024-02-27 DIAGNOSIS — M5416 Radiculopathy, lumbar region: Secondary | ICD-10-CM

## 2024-02-27 DIAGNOSIS — M25551 Pain in right hip: Secondary | ICD-10-CM | POA: Diagnosis not present

## 2024-02-27 DIAGNOSIS — M25552 Pain in left hip: Secondary | ICD-10-CM | POA: Diagnosis not present

## 2024-02-27 DIAGNOSIS — R262 Difficulty in walking, not elsewhere classified: Secondary | ICD-10-CM

## 2024-02-27 NOTE — Therapy (Signed)
 OUTPATIENT PHYSICAL THERAPY DAILY NOTE   Patient Name: [name] MRN: [id number] DOB:[birth year], 83 y.o., female Today's Date: 02/27/2024  END OF SESSION:  PT End of Session - 02/27/24 1120     Visit Number 5    Number of Visits 16    Date for PT Re-Evaluation 04/02/24    Authorization Type MCR    Progress Note Due on Visit 10    PT Start Time 1049    PT Stop Time 1127    PT Time Calculation (min) 38 min    Activity Tolerance Patient tolerated treatment well    Behavior During Therapy WFL for tasks assessed/performed               Past Medical History:  Diagnosis Date   Allergy    Anal or rectal pain    sometimes   Anemia    hx of during pregnancy   Anxiety    Arthritis    Asthma    hx of   Bradycardia    " I KNOW I HAVE BRADYCARDIA ESPECIALLY WHEN I SLEEP"    Carotid artery occlusion    Cataract 2021   bilateral eyes   Chronic back pain    Degenerative joint disease    osteo   Depression    Diabetes mellitus without complication (HCC)    DM type II   Diverticulosis 2003   Dysrhythmia    hx of  due to eye drop and also low heart rate 40's per pt. Dr. Katheryne Pane follows   Elevated total protein    Esophageal dysmotility    Fibromyalgia    GERD (gastroesophageal reflux disease)    subsequent Nissen Fundoplication   Glaucoma    Hearing loss    Heart murmur    "was told she had a heart murmur"   Hemorrhoids    Hiatal hernia 11/08/2009   Hx of adenomatous colonic polyps 07/02/2002   Hypercalcemia    Hyperlipidemia    Hyperlipidemia    Hypertension    Implantable loop recorder present 11/28/2021   Nausea    Osteoporosis    PONV (postoperative nausea and vomiting)    Rectal bleeding    from hemorrhoids.     Sleep apnea    DOES USE CPAP    Thrombocytopenia (HCC)    Varicose veins of left lower extremity    Past Surgical History:  Procedure Laterality Date   CARDIAC CATHETERIZATION  03/14/1992   Normal cardiac cath. Normal LV function.    CARDIOVASCULAR STRESS TEST  01/22/2011   No scintigraphic evidence of inducible ischemia.   CAROTID DOPPLER  03/31/2007   Bilateral ICAs - no evidence of significant diameter reduction, dissectin, tortuosity, FMD, or any other vascular abnormality.   CATARACT EXTRACTION, BILATERAL     ESOPHAGEAL MANOMETRY N/A 11/14/2015   Procedure: ESOPHAGEAL MANOMETRY (EM);  Surgeon: Sergio Dandy, MD;  Location: [facility];  Service: Endoscopy;  Laterality: N/A;   ESOPHAGEAL MANOMETRY N/A 01/18/2021   Procedure: ESOPHAGEAL MANOMETRY (EM);  Surgeon: Sergio Dandy, MD;  Location: [facility];  Service: Endoscopy;  Laterality: N/A;   ESOPHAGOGASTRODUODENOSCOPY N/A 03/08/2021   Procedure: ESOPHAGOGASTRODUODENOSCOPY (EGD);  Surgeon: Hilarie Lovely, MD;  Location[facility] OR;  Service: Thoracic;  Laterality: N/A;   EYE SURGERY     bilateral cataracts; bilateral stents   GASTRIC RESECTION  2009   GLAUCOMA SURGERY     JOINT REPLACEMENT     right knee Dr. France Ina 06-23-18   KNEE SURGERY Bilateral  NISSEN FUNDOPLICATION  2000   with subsequent takedown in 2009   TOTAL KNEE ARTHROPLASTY Left 06/10/2017   Procedure: LEFT  TOTAL KNEE ARTHROPLASTY;  Surgeon: Liliane Rei, MD;  Location: [facility];  Service: Orthopedics;  Laterality: Left;  Adductor Block   TOTAL KNEE ARTHROPLASTY Right 06/23/2018   Procedure: RIGHT TOTAL KNEE ARTHROPLASTY;  Surgeon: Liliane Rei, MD;  Location: [facility];  Service: Orthopedics;  Laterality: Right;   TRANSCAROTID ARTERY REVASCULARIZATION  Right 08/09/2023   Procedure: TRANSCAROTID ARTERY REVASCULARIZATION;  Surgeon: Carlene Che, MD;  Location: [facility] OR;  Service: Vascular;  Laterality: Right;   TRANSTHORACIC ECHOCARDIOGRAM  12/21/2010   EF 60%, moderate LVH,    TUBAL LIGATION     XI ROBOTIC ASSISTED HIATAL HERNIA REPAIR N/A 03/08/2021   Procedure: XI ROBOTIC ASSISTED LAPAROSCOPY WITH LYSIS OF ADHESIONS;  Surgeon: Hilarie Lovely, MD;  Location: [facility] OR;  Service: Thoracic;   Laterality: N/A;   Patient Active Problem List   Diagnosis Date Noted   Closed nondisplaced fracture of fifth metatarsal bone of left foot 08/07/2023   Stroke (HCC) 08/07/2023   Carotid stenosis, asymptomatic, right 08/07/2023   CVA (cerebral vascular accident) (HCC) 08/06/2023   Syncope 10/22/2022   Acute renal failure superimposed on stage 3a chronic kidney disease (HCC) 05/22/2022   AKI (acute kidney injury) (HCC) 05/21/2022   Hiatal hernia 03/08/2021   Heartburn    Ineffective esophageal motility    Dyspnea on exertion 07/24/2020   Change in bowel habits 08/22/2018   Knee stiff 07/23/2018   Degeneration of lumbar intervertebral disc 02/04/2018   Scoliosis of lumbar spine 02/04/2018   Sinus bradycardia 07/27/2016   Cough    Essential hypertension 11/24/2014   Gonalgia 05/13/2013   OA (osteoarthritis) of knee 05/13/2013   Degenerative joint disease involving multiple joints 01/19/2013   Rheumatoid arthritis (HCC) 01/19/2013   Thyroid  nodule 06/11/2012   Elevated total protein    Cyst of thyroid  10/29/2011   Hemorrhoids, internal 05/28/2011   Acid reflux 02/14/2011   Avitaminosis D 02/13/2011   Glaucoma 11/28/2010   HLD (hyperlipidemia) 12/14/2009   Internal and external bleeding hemorrhoids 12/14/2009   DIVERTICULOSIS, COLON 12/14/2009   Joint pain 12/14/2009   BACK PAIN, CHRONIC 12/14/2009   FIBROMYALGIA 12/14/2009   OSTEOPOROSIS 12/14/2009   Sleep apnea 12/14/2009   History of colonic polyps 12/14/2009   GASTRIC POLYP, HX OF 12/14/2009   Diaphragmatic hernia 10/04/2009   Depression 09/29/2009   REFLUX ESOPHAGITIS 09/29/2009   GERD 09/29/2009   Constipation 09/29/2009   DYSPHAGIA 09/29/2009    PCP: Nohemi Batters, MD  REFERRING PROVIDER: Jennefer Moats, DPM  REFERRING DIAG: 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 She feels that her foot pain is improving, but her nerve pain seems to alternate between plantar, dorsal and lateral aspect of foot.    EVAL: Patient presents to PT with history of slow healing 5th metatarsal fracture after a fall about 6 months ago. She states that she doesn't have a clear memory of the fall. "The doctors thought that I had a stroke. I went to lock the door and the next thing that I  remember is that I was on the floor and partially on the end table."  She states that she is still using the bone stimulator as instructed by referring provider. She is now able to wean from CAM boot. She states that she started walking to the bathroom without the boot at home. Today is her first day ambulating outside of the house without the CAM boot. She additionally reports hip and back pain over the past several months while ambulating with CAM boot and rollator.      PERTINENT HISTORY: Relevant PMHx includes anxiety, arthritis, bradycardia, carotid artery occlusion, depression, DM type II, fibromyalgia, glaucoma, hearing loss, HTN, CVA, carotid stenosis, scoliosis of lumbar spine   PAIN:  Are you having pain?  Yes: NPRS scale: 3/10 current, 10/10 (occasionally, nerve pain) Pain location: L foot (lateral aspect), hips, lower back   Pain description: dull ache  Aggravating factors: weight bearing Relieving factors: sitting, laying down  PRECAUTIONS: Fall  RED FLAGS: None   WEIGHT BEARING RESTRICTIONS: Yes WBAT, wean/discontinue CAM boot and transition to regular shoes  FALLS:  Has patient fallen in last 6 months? Yes. Number of falls 1, see MOI   LIVING ENVIRONMENT: Lives with: lives with their spouse Lives  in: House/apartment Stairs: Yes: two external steps with railing  Has following equipment at home: Single point cane and Walker - 4 wheeled for community ambulation   OCCUPATION: n/a   PLOF: Independent return to pain-free household mobility, ambulation, household chores   PATIENT GOALS: To return to ambulating with regular shoes and cane; address pain in foot, hip/back  NEXT MD VISIT: 03/30/2024  OBJECTIVE:  Note: Objective measures were completed at Evaluation unless otherwise noted.  DIAGNOSTIC FINDINGS:   Per referring provider office note (01/27/2024): "Xrays reviewed. Acute fracture noted to proximal fifth metatarsal noted through lateral half of bone just distal to tarsometatarsal joint. Fracture line appears to be nearly completely healed."   PATIENT SURVEYS:  LEFS 22/80  COGNITION: Overall cognitive status: Within functional limits for tasks assessed     SENSATION: Not tested  PALPATION: Moderate tenderness to palpation and proximal end of 5th metatarsal  LUMBAR ROM:   Active  A/PROM  eval  Flexion 80%  Extension 30%, back and hip pain reported  Right lateral flexion 75%  Left lateral flexion 75%  Right rotation 30%, stiffness  Left rotation 40%, stiffness   (Blank rows = not tested)   LOWER EXTREMITY ROM:  Active ROM Right eval Left eval  Hip flexion    Hip extension    Hip abduction    Hip adduction    Hip internal rotation    Hip external rotation    Knee flexion    Knee extension    Ankle dorsiflexion 10 -5  Ankle plantarflexion 35 28  Ankle inversion 16 14  Ankle eversion 15 3   (Blank rows = not tested)  LOWER EXTREMITY MMT:   MMT Right eval Left eval  Hip flexion    Hip extension    Hip abduction    Hip adduction    Hip internal rotation    Hip external rotation    Knee flexion    Knee extension    Ankle dorsiflexion    Ankle plantarflexion    Ankle inversion    Ankle eversion     (Blank rows = not tested)   FUNCTIONAL  TESTS:  5 times sit to stand: 16 seconds with UE, reports burning in foot.   GAIT: Distance walked: 30  ft from lobby to evaluation area Assistive device utilized: Walker - 4 wheeled Level of assistance: Complete Independence Comments: antalgic gait pattern with decreased L stance time                                                                                                                                TREATMENT DATE:  Discontinue UE component of Nustep with UE d/t symptom exacerbation.    [facility] Adult PT Treatment:                                                DATE: 02/27/2024  Therapeutic Exercise: AP rockerboard, sitting x12 ML Dynandisc sitting x20 Seated heel/toe raises 2x10 5lb each LE  LAQ BW x8 BIL  HEP update + education  Neuromuscular re-ed: Seated Dynadisc circles x10 each way Towel scrunch limited ROM  Updated to resisted toe curls with GTB x 15  Seated Toe Yoga 2x12 LLE   [facility] Adult PT Treatment:                                                DATE: 02/25/24 Therapeutic Exercise: AP rockerboard, sitting x12 ML rockerboard sitting x12 Seated heel/toe raises 2x15 unweighted LAQ BW x8 BIL  HEP update + education  Neuromuscular re-ed: BAPS circles x8 each way Towel scrunch limited ROM for comfort, 2x12 LLE Seated gross toe extension 2x12 LLE      [facility] Adult PT Treatment  02/13/2024:  Therapeutic Exercise:  nu-step L2 36m while taking subjective and planning session with patient BAPS board L 3  - sagital and coronal  Ankle 4 way with Blue TB - 15x ea Supine clam with GTB - 2x10 Bil SAQ - 2# - 3x10 Hip adduction squeeze - 2x10  Seated heel raise with 2#     PATIENT EDUCATION:  Education details: discussed benefit of sock aid for compression sock. reviewed initial home exercise program; discussion of POC, prognosis and goals for skilled PT   Person educated: Patient Education method: Explanation, Demonstration, and Handouts Education  comprehension: verbalized understanding, returned demonstration, and needs further education  HOME EXERCISE PROGRAM: Access Code: [email or web address] URL: [email or web address] Date: 02/25/2024 Prepared by: Mayme Spearman  Program Notes Remember to look for a "Sock Aid" to help with putting on your compression socks.   Exercises - Ankle Inversion Eversion Towel Slide  - 1 x daily - 7 x weekly - 2 sets - 10 reps - Seated Toe Curl  - 1 x daily - 7 x weekly - 2 sets - 10 reps - Toe Spreading  - 1 x daily - 7 x weekly - 2 sets - 10 reps - Seated Hip Abduction  with Resistance  - 1 x daily - 4-7 x weekly - 3 sets - 10 reps - Seated Hip Adduction Isometrics with Ball  - 1 x daily - 4-7 x weekly - 1 sets - 10 reps - 10 hold - Seated Heel Raise  - 1 x daily - 7 x weekly - 2 sets - 10 reps  ASSESSMENT:  CLINICAL IMPRESSION:  02/27/2024: Kenney Peacemaker had improved tolerance during today's treatment session, which focused on progression of ankle mobility and strengthening program. She does have some difficulty with toe yoga today. We will continue to progress per POC as tolerated, in order to reach established rehab goals.   EVAL: Ase is a 83 y.o. female who was seen today for physical therapy evaluation and treatment for Left Foot Pain with mobility deficits, related to slow healing (>6 month) 5th metatarsal fracture. She is demonstrating decreased L ankle AROM, decreased L ankle MMT, altered gait mechanics, and decreased STS time. She has developed BIL hip and low back pain that is likely related to extended time ambulating with CAM boot, which she is currently weaning from. She has related pain and difficulty with walking, ADLs/IADLs. She also remains at risk for falls, and requires patient education and PT intervention to decrease risk of falls. She requires skilled PT services at this time to address relevant deficits and improve overall function.     OBJECTIVE IMPAIRMENTS: Abnormal gait, decreased  activity tolerance, decreased balance, decreased mobility, difficulty walking, decreased ROM, decreased strength, increased edema, and pain.   ACTIVITY LIMITATIONS: standing, squatting, and stairs  PARTICIPATION LIMITATIONS: meal prep, cleaning, laundry, shopping, and community activity  PERSONAL FACTORS: Age, Time since onset of injury/illness/exacerbation, and 3+ comorbidities: Relevant PMHx includes anxiety, arthritis, bradycardia, carotid artery occlusion, depression, DM type II, fibromyalgia, glaucoma, hearing loss, HTN, CVA, carotid stenosis, scoliosis of lumbar spine are also affecting patient's functional outcome.   REHAB POTENTIAL: Fair    CLINICAL DECISION MAKING: Evolving/moderate complexity  EVALUATION COMPLEXITY: Moderate   GOALS: Goals reviewed with patient? YES  SHORT TERM GOALS: Target date: 03/05/2024  Patient will be independent with initial home program at least 3 days/week.  Baseline: provided at eval Goal Status: INITIAL   2.  Patient will demonstrate complete 5 time Sit-to-Stand test in 14 seconds or less in order to demonstrate decreasing risk of falls.  Baseline: see objective measures Goal Status: INITIAL   3.  Patient will demonstrate improved L ankle DF AROM to at least neutral (0 degrees).  Baseline: lacking 5 degrees of DF Goal status: INITIAL   LONG TERM GOALS: Target date: 04/02/2024  Patient will report improved overall functional ability with LEFS score of 60/80 or greater.  Baseline: 22/80 Goal Status: INITIAL   2.  Patient will return to safe ambulation with [facility] for household and community ambulation.  Baseline: ambulating with antalgic gait with rollator  Goal status: INITIAL  3.  Patient will demonstrate at least 4+/5 MMT with LQ testing in L LE. Baseline: see objective measures  Goal status: INITIAL  4.  Patient will report ability to perform household chores and ADLs with no greater than 1/61 foot pain.  Baseline: up to 10/10 pain;  moderate-to-severe difficulty perform activities around her home  Goal status: INITIAL     PLAN:  PT FREQUENCY: 1-2x/week  PT DURATION: 8 weeks  PLANNED INTERVENTIONS: 97164- PT Re-evaluation, 97750- Physical Performance Testing, 97110-Therapeutic exercises, 97530- Therapeutic activity, [id number]- Neuromuscular re-education, 97535- Self Care, [id number]- Manual therapy, [id number]- Gait training, [phone]- Aquatic Therapy,  13244- Vasopneumatic device, Patient/Family education, Balance training, Stair training, Taping, Joint mobilization, Cryotherapy, and Moist heat  PLAN FOR NEXT SESSION:  assess LBP and relevant obj measures; FGA or DGI when appropriate; progress towards established goals (to include ankle AROM, ankle strengthening, knee/hip strengthening, weight bearing, balance, gait training, progress to [facility] when appropriate, low-impact aerobic activity, manual therapy as indicated, other modalities as indicated).    Arlester Bence, PT, DPT  02/27/2024 4:37 PM

## 2024-02-28 ENCOUNTER — Other Ambulatory Visit: Payer: Self-pay | Admitting: *Deleted

## 2024-02-28 DIAGNOSIS — Z95828 Presence of other vascular implants and grafts: Secondary | ICD-10-CM

## 2024-03-02 ENCOUNTER — Ambulatory Visit: Payer: Self-pay | Admitting: Cardiology

## 2024-03-02 ENCOUNTER — Other Ambulatory Visit: Payer: Self-pay

## 2024-03-02 ENCOUNTER — Ambulatory Visit (INDEPENDENT_AMBULATORY_CARE_PROVIDER_SITE_OTHER): Payer: Medicare Other

## 2024-03-02 DIAGNOSIS — R55 Syncope and collapse: Secondary | ICD-10-CM | POA: Diagnosis not present

## 2024-03-02 LAB — CUP PACEART REMOTE DEVICE CHECK
Date Time Interrogation Session: 20250518232737
Implantable Pulse Generator Implant Date: 20240222

## 2024-03-02 MED ORDER — REPATHA SURECLICK 140 MG/ML ~~LOC~~ SOAJ
140.0000 mg | SUBCUTANEOUS | 5 refills | Status: AC
Start: 2024-01-23 — End: ?

## 2024-03-03 ENCOUNTER — Ambulatory Visit

## 2024-03-03 DIAGNOSIS — M25572 Pain in left ankle and joints of left foot: Secondary | ICD-10-CM | POA: Diagnosis not present

## 2024-03-03 DIAGNOSIS — R262 Difficulty in walking, not elsewhere classified: Secondary | ICD-10-CM | POA: Diagnosis not present

## 2024-03-03 DIAGNOSIS — M25552 Pain in left hip: Secondary | ICD-10-CM | POA: Diagnosis not present

## 2024-03-03 DIAGNOSIS — M5416 Radiculopathy, lumbar region: Secondary | ICD-10-CM | POA: Diagnosis not present

## 2024-03-03 DIAGNOSIS — M25551 Pain in right hip: Secondary | ICD-10-CM | POA: Diagnosis not present

## 2024-03-03 NOTE — Therapy (Signed)
 OUTPATIENT PHYSICAL THERAPY DAILY NOTE   Patient Name: KALEEN ROCHETTE MRN: [id number] DOB:[birth year], 83 y.o., female Today's Date: 03/03/2024  END OF SESSION:    PT End of Session - 03/03/24 1055     Visit Number 6    Number of Visits 16    Date for PT Re-Evaluation 04/02/24    Authorization Type MCR    Progress Note Due on Visit 10    PT Start Time 1050    PT Stop Time 1128    PT Time Calculation (min) 38 min    Activity Tolerance Patient tolerated treatment well    Behavior During Therapy WFL for tasks assessed/performed               Past Medical History:  Diagnosis Date   Allergy    Anal or rectal pain    sometimes   Anemia    hx of during pregnancy   Anxiety    Arthritis    Asthma    hx of   Bradycardia    " I KNOW I HAVE BRADYCARDIA ESPECIALLY WHEN I SLEEP"    Carotid artery occlusion    Cataract 2021   bilateral eyes   Chronic back pain    Degenerative joint disease    osteo   Depression    Diabetes mellitus without complication (HCC)    DM type II   Diverticulosis 2003   Dysrhythmia    hx of  due to eye drop and also low heart rate 40's per pt. Dr. Katheryne Pane follows   Elevated total protein    Esophageal dysmotility    Fibromyalgia    GERD (gastroesophageal reflux disease)    subsequent Nissen Fundoplication   Glaucoma    Hearing loss    Heart murmur    "was told she had a heart murmur"   Hemorrhoids    Hiatal hernia 11/08/2009   Hx of adenomatous colonic polyps 07/02/2002   Hypercalcemia    Hyperlipidemia    Hyperlipidemia    Hypertension    Implantable loop recorder present 11/28/2021   Nausea    Osteoporosis    PONV (postoperative nausea and vomiting)    Rectal bleeding    from hemorrhoids.     Sleep apnea    DOES USE CPAP    Thrombocytopenia (HCC)    Varicose veins of left lower extremity    Past Surgical History:  Procedure Laterality Date   CARDIAC CATHETERIZATION  03/14/1992   Normal cardiac cath. Normal LV function.    CARDIOVASCULAR STRESS TEST  01/22/2011   No scintigraphic evidence of inducible ischemia.   CAROTID DOPPLER  03/31/2007   Bilateral ICAs - no evidence of significant diameter reduction, dissectin, tortuosity, FMD, or any other vascular abnormality.   CATARACT EXTRACTION, BILATERAL     ESOPHAGEAL MANOMETRY N/A 11/14/2015   Procedure: ESOPHAGEAL MANOMETRY (EM);  Surgeon: Sergio Dandy, MD;  Location: [facility];  Service: Endoscopy;  Laterality: N/A;   ESOPHAGEAL MANOMETRY N/A 01/18/2021   Procedure: ESOPHAGEAL MANOMETRY (EM);  Surgeon: Sergio Dandy, MD;  Location: [facility];  Service: Endoscopy;  Laterality: N/A;   ESOPHAGOGASTRODUODENOSCOPY N/A 03/08/2021   Procedure: ESOPHAGOGASTRODUODENOSCOPY (EGD);  Surgeon: Hilarie Lovely, MD;  Location: [facility] OR;  Service: Thoracic;  Laterality: N/A;   EYE SURGERY     bilateral cataracts; bilateral stents   GASTRIC RESECTION  2009   GLAUCOMA SURGERY     JOINT REPLACEMENT     right knee Dr. France Ina 06-23-18   KNEE  SURGERY Bilateral    NISSEN FUNDOPLICATION  2000   with subsequent takedown in 2009   TOTAL KNEE ARTHROPLASTY Left 06/10/2017   Procedure: LEFT  TOTAL KNEE ARTHROPLASTY;  Surgeon: Liliane Rei, MD;  Location: [facility];  Service: Orthopedics;  Laterality: Left;  Adductor Block   TOTAL KNEE ARTHROPLASTY Right 06/23/2018   Procedure: RIGHT TOTAL KNEE ARTHROPLASTY;  Surgeon: Liliane Rei, MD;  Location: [facility];  Service: Orthopedics;  Laterality: Right;   TRANSCAROTID ARTERY REVASCULARIZATION  Right 08/09/2023   Procedure: TRANSCAROTID ARTERY REVASCULARIZATION;  Surgeon: Carlene Che, MD;  Location: [facility] OR;  Service: Vascular;  Laterality: Right;   TRANSTHORACIC ECHOCARDIOGRAM  12/21/2010   EF 60%, moderate LVH,    TUBAL LIGATION     XI ROBOTIC ASSISTED HIATAL HERNIA REPAIR N/A 03/08/2021   Procedure: XI ROBOTIC ASSISTED LAPAROSCOPY WITH LYSIS OF ADHESIONS;  Surgeon: Hilarie Lovely, MD;  Location: [facility] OR;  Service: Thoracic;   Laterality: N/A;   Patient Active Problem List   Diagnosis Date Noted   Closed nondisplaced fracture of fifth metatarsal bone of left foot 08/07/2023   Stroke (HCC) 08/07/2023   Carotid stenosis, asymptomatic, right 08/07/2023   CVA (cerebral vascular accident) (HCC) 08/06/2023   Syncope 10/22/2022   Acute renal failure superimposed on stage 3a chronic kidney disease (HCC) 05/22/2022   AKI (acute kidney injury) (HCC) 05/21/2022   Hiatal hernia 03/08/2021   Heartburn    Ineffective esophageal motility    Dyspnea on exertion 07/24/2020   Change in bowel habits 08/22/2018   Knee stiff 07/23/2018   Degeneration of lumbar intervertebral disc 02/04/2018   Scoliosis of lumbar spine 02/04/2018   Sinus bradycardia 07/27/2016   Cough    Essential hypertension 11/24/2014   Gonalgia 05/13/2013   OA (osteoarthritis) of knee 05/13/2013   Degenerative joint disease involving multiple joints 01/19/2013   Rheumatoid arthritis (HCC) 01/19/2013   Thyroid  nodule 06/11/2012   Elevated total protein    Cyst of thyroid  10/29/2011   Hemorrhoids, internal 05/28/2011   Acid reflux 02/14/2011   Avitaminosis D 02/13/2011   Glaucoma 11/28/2010   HLD (hyperlipidemia) 12/14/2009   Internal and external bleeding hemorrhoids 12/14/2009   DIVERTICULOSIS, COLON 12/14/2009   Joint pain 12/14/2009   BACK PAIN, CHRONIC 12/14/2009   FIBROMYALGIA 12/14/2009   OSTEOPOROSIS 12/14/2009   Sleep apnea 12/14/2009   History of colonic polyps 12/14/2009   GASTRIC POLYP, HX OF 12/14/2009   Diaphragmatic hernia 10/04/2009   Depression 09/29/2009   REFLUX ESOPHAGITIS 09/29/2009   GERD 09/29/2009   Constipation 09/29/2009   DYSPHAGIA 09/29/2009    PCP: Nohemi Batters, MD  REFERRING PROVIDER: Jennefer Moats, DPM  REFERRING DIAG: 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 She otherwise, reports minimal-to-no-foot pain this morning.    EVAL: Patient presents to PT with history of slow healing 5th metatarsal fracture after a fall about 6 months ago. She states that she doesn't have a clear memory of the fall. "The doctors thought that I had a stroke. I went to lock the door and the next thing that I remember is that I was on the  floor and partially on the end table."  She states that she is still using the bone stimulator as instructed by referring provider. She is now able to wean from CAM boot. She states that she started walking to the bathroom without the boot at home. Today is her first day ambulating outside of the house without the CAM boot. She additionally reports hip and back pain over the past several months while ambulating with CAM boot and rollator.      PERTINENT HISTORY: Relevant PMHx includes anxiety, arthritis, bradycardia, carotid artery occlusion, depression, DM type II, fibromyalgia, glaucoma, hearing loss, HTN, CVA, carotid stenosis, scoliosis of lumbar spine   PAIN:  Are you having pain?  Yes: NPRS scale: 3/10 current, 10/10 (occasionally, nerve pain) Pain location: L foot (lateral aspect), hips, lower back   Pain description: dull ache  Aggravating factors: weight bearing Relieving factors: sitting, laying down  PRECAUTIONS: Fall  RED FLAGS: None   WEIGHT BEARING RESTRICTIONS: Yes WBAT, wean/discontinue CAM boot and transition to regular shoes  FALLS:  Has patient fallen in last 6 months? Yes. Number of falls 1, see MOI   LIVING ENVIRONMENT: Lives with: lives with their spouse Lives in: House/apartment Stairs: Yes: two  external steps with railing  Has following equipment at home: Single point cane and Walker - 4 wheeled for community ambulation   OCCUPATION: n/a   PLOF: Independent return to pain-free household mobility, ambulation, household chores   PATIENT GOALS: To return to ambulating with regular shoes and cane; address pain in foot, hip/back  NEXT MD VISIT: 03/30/2024  OBJECTIVE:  Note: Objective measures were completed at Evaluation unless otherwise noted.  DIAGNOSTIC FINDINGS:   Per referring provider office note (01/27/2024): "Xrays reviewed. Acute fracture noted to proximal fifth metatarsal noted through lateral half of bone just distal to tarsometatarsal joint. Fracture line appears to be nearly completely healed."   PATIENT SURVEYS:  LEFS 22/80  COGNITION: Overall cognitive status: Within functional limits for tasks assessed     SENSATION: Not tested  PALPATION: Moderate tenderness to palpation and proximal end of 5th metatarsal  LUMBAR ROM:   Active  A/PROM  eval  Flexion 80%  Extension 30%, back and hip pain reported  Right lateral flexion 75%  Left lateral flexion 75%  Right rotation 30%, stiffness  Left rotation 40%, stiffness   (Blank rows = not tested)   LOWER EXTREMITY ROM:  Active ROM Right eval Left eval  Hip flexion    Hip extension    Hip abduction    Hip adduction    Hip internal rotation    Hip external rotation    Knee flexion    Knee extension    Ankle dorsiflexion 10 -5  Ankle plantarflexion 35 28  Ankle inversion 16 14  Ankle eversion 15 3   (Blank rows = not tested)  LOWER EXTREMITY MMT:   MMT Right eval Left eval  Hip flexion    Hip extension    Hip abduction    Hip adduction    Hip internal rotation    Hip external rotation    Knee flexion    Knee extension    Ankle dorsiflexion    Ankle plantarflexion    Ankle inversion    Ankle eversion     (Blank rows = not tested)   FUNCTIONAL TESTS:  5 times sit to stand: 16  seconds with UE, reports burning in foot.   GAIT: Distance walked: 30 ft from lobby to evaluation area Assistive  device utilized: Environmental consultant - 4 wheeled Level of assistance: Complete Independence Comments: antalgic gait pattern with decreased L stance time                                                                                                                                TREATMENT DATE:    [facility] Adult PT Treatment:                                                DATE: 03/03/2024   Therapeutic Exercise: AP Dynadisc, sitting x 30 sec ML Dynandisc sitting x 30 sec Seated heel/toe raises 2x10 5lb each LE  LAQ BW x20 BIL   Neuromuscular re-ed: Seated Dynadisc circles x10 each way Towel scrunch limited ROM and alternating with toe yoga (3 x 30 sec each)  Ankle 4 way with Blue TB - 20x ea nu-step (LE only) L2 40m while taking subjective and planning session with patient     PATIENT EDUCATION:  Education details: discussed benefit of sock aid for compression sock. reviewed initial home exercise program; discussion of POC, prognosis and goals for skilled PT   Person educated: Patient Education method: Explanation, Demonstration, and Handouts Education comprehension: verbalized understanding, returned demonstration, and needs further education  HOME EXERCISE PROGRAM: Access Code: [email or web address] URL: [email or web address] Date: 02/25/2024 Prepared by: Mayme Spearman  Program Notes Remember to look for a "Sock Aid" to help with putting on your compression socks.   Exercises - Ankle Inversion Eversion Towel Slide  - 1 x daily - 7 x weekly - 2 sets - 10 reps - Seated Toe Curl  - 1 x daily - 7 x weekly - 2 sets - 10 reps - Toe Spreading  - 1 x daily - 7 x weekly - 2 sets - 10 reps - Seated Hip Abduction with Resistance  - 1 x daily - 4-7 x weekly - 3 sets - 10 reps - Seated Hip Adduction Isometrics with Ball  - 1 x daily - 4-7 x weekly - 1 sets - 10 reps - 10 hold -  Seated Heel Raise  - 1 x daily - 7 x weekly - 2 sets - 10 reps  ASSESSMENT:  CLINICAL IMPRESSION:  03/03/2024: Christol had good tolerance of today's treatment session, which focused on progression of ankle ROM and strength activities. We will continue to progress per POC as tolerated, in order to reach established rehab goals.    EVAL: Colena is a 83 y.o. female who was seen today for physical therapy evaluation and treatment for Left Foot Pain with mobility deficits, related to slow healing (>6 month) 5th metatarsal fracture. She is demonstrating decreased L ankle AROM, decreased L ankle MMT, altered gait mechanics, and decreased STS time. She has developed BIL hip and low back pain that is likely related  to extended time ambulating with CAM boot, which she is currently weaning from. She has related pain and difficulty with walking, ADLs/IADLs. She also remains at risk for falls, and requires patient education and PT intervention to decrease risk of falls. She requires skilled PT services at this time to address relevant deficits and improve overall function.     OBJECTIVE IMPAIRMENTS: Abnormal gait, decreased activity tolerance, decreased balance, decreased mobility, difficulty walking, decreased ROM, decreased strength, increased edema, and pain.   ACTIVITY LIMITATIONS: standing, squatting, and stairs  PARTICIPATION LIMITATIONS: meal prep, cleaning, laundry, shopping, and community activity  PERSONAL FACTORS: Age, Time since onset of injury/illness/exacerbation, and 3+ comorbidities: Relevant PMHx includes anxiety, arthritis, bradycardia, carotid artery occlusion, depression, DM type II, fibromyalgia, glaucoma, hearing loss, HTN, CVA, carotid stenosis, scoliosis of lumbar spine are also affecting patient's functional outcome.   REHAB POTENTIAL: Fair    CLINICAL DECISION MAKING: Evolving/moderate complexity  EVALUATION COMPLEXITY: Moderate   GOALS: Goals reviewed with patient? YES  SHORT TERM  GOALS: Target date: 03/05/2024  Patient will be independent with initial home program at least 3 days/week.  Baseline: provided at eval Goal Status: INITIAL   2.  Patient will demonstrate complete 5 time Sit-to-Stand test in 14 seconds or less in order to demonstrate decreasing risk of falls.  Baseline: see objective measures Goal Status: INITIAL   3.  Patient will demonstrate improved L ankle DF AROM to at least neutral (0 degrees).  Baseline: lacking 5 degrees of DF Goal status: INITIAL   LONG TERM GOALS: Target date: 04/02/2024  Patient will report improved overall functional ability with LEFS score of 60/80 or greater.  Baseline: 22/80 Goal Status: INITIAL   2.  Patient will return to safe ambulation with [facility] for household and community ambulation.  Baseline: ambulating with antalgic gait with rollator  Goal status: INITIAL  3.  Patient will demonstrate at least 4+/5 MMT with LQ testing in L LE. Baseline: see objective measures  Goal status: INITIAL  4.  Patient will report ability to perform household chores and ADLs with no greater than 1/91 foot pain.  Baseline: up to 10/10 pain; moderate-to-severe difficulty perform activities around her home  Goal status: INITIAL     PLAN:  PT FREQUENCY: 1-2x/week  PT DURATION: 8 weeks  PLANNED INTERVENTIONS: [id number]- PT Re-evaluation, 97750- Physical Performance Testing, 97110-Therapeutic exercises, 97530- Therapeutic activity, [id number]- Neuromuscular re-education, 97535- Self Care, 47829- Manual therapy, [phone]- Gait training, [phone]- Aquatic Therapy, [phone]- Vasopneumatic device, Patient/Family education, Balance training, Stair training, Taping, Joint mobilization, Cryotherapy, and Moist heat  PLAN FOR NEXT SESSION:  assess LBP and relevant obj measures; FGA or DGI when appropriate; progress towards established goals (to include ankle AROM, ankle strengthening, knee/hip strengthening, weight bearing, balance, gait training, progress to  [facility] when appropriate, low-impact aerobic activity, manual therapy as indicated, other modalities as indicated).    Arlester Bence, PT, DPT  03/04/2024 3:42 PM

## 2024-03-05 ENCOUNTER — Ambulatory Visit

## 2024-03-05 DIAGNOSIS — M25572 Pain in left ankle and joints of left foot: Secondary | ICD-10-CM | POA: Diagnosis not present

## 2024-03-05 DIAGNOSIS — M25552 Pain in left hip: Secondary | ICD-10-CM | POA: Diagnosis not present

## 2024-03-05 DIAGNOSIS — R262 Difficulty in walking, not elsewhere classified: Secondary | ICD-10-CM | POA: Diagnosis not present

## 2024-03-05 DIAGNOSIS — M5416 Radiculopathy, lumbar region: Secondary | ICD-10-CM | POA: Diagnosis not present

## 2024-03-05 DIAGNOSIS — M25551 Pain in right hip: Secondary | ICD-10-CM | POA: Diagnosis not present

## 2024-03-05 NOTE — Therapy (Signed)
 OUTPATIENT PHYSICAL THERAPY DAILY NOTE   Patient Name: [name] MRN: [id number] DOB:21-Sep-1941, 83 y.o., female Today's Date: 03/05/2024  END OF SESSION:  PT End of Session - 03/05/24 1101     Visit Number 7    Number of Visits 16    Date for PT Re-Evaluation 04/02/24    Authorization Type MCR    Progress Note Due on Visit 10    PT Start Time 1049    PT Stop Time 1130    PT Time Calculation (min) 41 min    Activity Tolerance Patient tolerated treatment well    Behavior During Therapy WFL for tasks assessed/performed                 Past Medical History:  Diagnosis Date   Allergy    Anal or rectal pain    sometimes   Anemia    hx of during pregnancy   Anxiety    Arthritis    Asthma    hx of   Bradycardia    " I KNOW I HAVE BRADYCARDIA ESPECIALLY WHEN I SLEEP"    Carotid artery occlusion    Cataract 2021   bilateral eyes   Chronic back pain    Degenerative joint disease    osteo   Depression    Diabetes mellitus without complication (HCC)    DM type II   Diverticulosis 2003   Dysrhythmia    hx of  due to eye drop and also low heart rate 40's per pt. Dr. Katheryne Pane follows   Elevated total protein    Esophageal dysmotility    Fibromyalgia    GERD (gastroesophageal reflux disease)    subsequent Nissen Fundoplication   Glaucoma    Hearing loss    Heart murmur    "was told she had a heart murmur"   Hemorrhoids    Hiatal hernia 11/08/2009   Hx of adenomatous colonic polyps 07/02/2002   Hypercalcemia    Hyperlipidemia    Hyperlipidemia    Hypertension    Implantable loop recorder present 11/28/2021   Nausea    Osteoporosis    PONV (postoperative nausea and vomiting)    Rectal bleeding    from hemorrhoids.     Sleep apnea    DOES USE CPAP    Thrombocytopenia (HCC)    Varicose veins of left lower extremity    Past Surgical History:  Procedure Laterality Date   CARDIAC CATHETERIZATION  03/14/1992   Normal cardiac cath. Normal LV function.    CARDIOVASCULAR STRESS TEST  01/22/2011   No scintigraphic evidence of inducible ischemia.   CAROTID DOPPLER  03/31/2007   Bilateral ICAs - no evidence of significant diameter reduction, dissectin, tortuosity, FMD, or any other vascular abnormality.   CATARACT EXTRACTION, BILATERAL     ESOPHAGEAL MANOMETRY N/A 11/14/2015   Procedure: ESOPHAGEAL MANOMETRY (EM);  Surgeon: Sergio Dandy, MD;  Location: [facility];  Service: Endoscopy;  Laterality: N/A;   ESOPHAGEAL MANOMETRY N/A 01/18/2021   Procedure: ESOPHAGEAL MANOMETRY (EM);  Surgeon: Sergio Dandy, MD;  Location: [facility];  Service: Endoscopy;  Laterality: N/A;   ESOPHAGOGASTRODUODENOSCOPY N/A 03/08/2021   Procedure: ESOPHAGOGASTRODUODENOSCOPY (EGD);  Surgeon: Hilarie Lovely, MD;  Location: [facility] OR;  Service: Thoracic;  Laterality: N/A;   EYE SURGERY     bilateral cataracts; bilateral stents   GASTRIC RESECTION  2009   GLAUCOMA SURGERY     JOINT REPLACEMENT     right knee Dr. France Ina 06-23-18   KNEE  SURGERY Bilateral    NISSEN FUNDOPLICATION  2000   with subsequent takedown in 2009   TOTAL KNEE ARTHROPLASTY Left 06/10/2017   Procedure: LEFT  TOTAL KNEE ARTHROPLASTY;  Surgeon: Liliane Rei, MD;  Location: [facility];  Service: Orthopedics;  Laterality: Left;  Adductor Block   TOTAL KNEE ARTHROPLASTY Right 06/23/2018   Procedure: RIGHT TOTAL KNEE ARTHROPLASTY;  Surgeon: Liliane Rei, MD;  Location: [facility];  Service: Orthopedics;  Laterality: Right;   TRANSCAROTID ARTERY REVASCULARIZATION  Right 08/09/2023   Procedure: TRANSCAROTID ARTERY REVASCULARIZATION;  Surgeon: Carlene Che, MD;  Location: [facility] OR;  Service: Vascular;  Laterality: Right;   TRANSTHORACIC ECHOCARDIOGRAM  12/21/2010   EF 60%, moderate LVH,    TUBAL LIGATION     XI ROBOTIC ASSISTED HIATAL HERNIA REPAIR N/A 03/08/2021   Procedure: XI ROBOTIC ASSISTED LAPAROSCOPY WITH LYSIS OF ADHESIONS;  Surgeon: Hilarie Lovely, MD;  Location: [facility] OR;  Service: Thoracic;   Laterality: N/A;   Patient Active Problem List   Diagnosis Date Noted   Closed nondisplaced fracture of fifth metatarsal bone of left foot 08/07/2023   Stroke (HCC) 08/07/2023   Carotid stenosis, asymptomatic, right 08/07/2023   CVA (cerebral vascular accident) (HCC) 08/06/2023   Syncope 10/22/2022   Acute renal failure superimposed on stage 3a chronic kidney disease (HCC) 05/22/2022   AKI (acute kidney injury) (HCC) 05/21/2022   Hiatal hernia 03/08/2021   Heartburn    Ineffective esophageal motility    Dyspnea on exertion 07/24/2020   Change in bowel habits 08/22/2018   Knee stiff 07/23/2018   Degeneration of lumbar intervertebral disc 02/04/2018   Scoliosis of lumbar spine 02/04/2018   Sinus bradycardia 07/27/2016   Cough    Essential hypertension 11/24/2014   Gonalgia 05/13/2013   OA (osteoarthritis) of knee 05/13/2013   Degenerative joint disease involving multiple joints 01/19/2013   Rheumatoid arthritis (HCC) 01/19/2013   Thyroid  nodule 06/11/2012   Elevated total protein    Cyst of thyroid  10/29/2011   Hemorrhoids, internal 05/28/2011   Acid reflux 02/14/2011   Avitaminosis D 02/13/2011   Glaucoma 11/28/2010   HLD (hyperlipidemia) 12/14/2009   Internal and external bleeding hemorrhoids 12/14/2009   DIVERTICULOSIS, COLON 12/14/2009   Joint pain 12/14/2009   BACK PAIN, CHRONIC 12/14/2009   FIBROMYALGIA 12/14/2009   OSTEOPOROSIS 12/14/2009   Sleep apnea 12/14/2009   History of colonic polyps 12/14/2009   GASTRIC POLYP, HX OF 12/14/2009   Diaphragmatic hernia 10/04/2009   Depression 09/29/2009   REFLUX ESOPHAGITIS 09/29/2009   GERD 09/29/2009   Constipation 09/29/2009   DYSPHAGIA 09/29/2009    PCP: Nohemi Batters, MD  REFERRING PROVIDER: Jennefer Moats, DPM  REFERRING DIAG: 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 She rested much of the afternoon d/t fatigue. She does have some knee pain, but only mild aching in her hips and back today.    EVAL: Patient presents to PT with history of slow healing 5th metatarsal fracture after a fall about 6 months ago. She states that she doesn't have a clear memory of the fall. "The doctors thought that I had a stroke. I went to lock the door and the next  thing that I remember is that I was on the floor and partially on the end table."  She states that she is still using the bone stimulator as instructed by referring provider. She is now able to wean from CAM boot. She states that she started walking to the bathroom without the boot at home. Today is her first day ambulating outside of the house without the CAM boot. She additionally reports hip and back pain over the past several months while ambulating with CAM boot and rollator.      PERTINENT HISTORY: Relevant PMHx includes anxiety, arthritis, bradycardia, carotid artery occlusion, depression, DM type II, fibromyalgia, glaucoma, hearing loss, HTN, CVA, carotid stenosis, scoliosis of lumbar spine   PAIN:  Are you having pain?  Yes: NPRS scale: 3/10 current, 10/10 (occasionally, nerve pain) Pain location: L foot (lateral aspect), hips, lower back   Pain description: dull ache  Aggravating factors: weight bearing Relieving factors: sitting, laying down  PRECAUTIONS: Fall  RED FLAGS: None   WEIGHT BEARING RESTRICTIONS: Yes WBAT, wean/discontinue CAM boot and transition to regular shoes  FALLS:  Has patient fallen in last 6 months? Yes. Number of falls 1, see MOI   LIVING ENVIRONMENT: Lives with: lives with their spouse Lives in: House/apartment Stairs: Yes:  two external steps with railing  Has following equipment at home: Single point cane and Walker - 4 wheeled for community ambulation   OCCUPATION: n/a   PLOF: Independent return to pain-free household mobility, ambulation, household chores   PATIENT GOALS: To return to ambulating with regular shoes and cane; address pain in foot, hip/back  NEXT MD VISIT: 03/30/2024  OBJECTIVE:  Note: Objective measures were completed at Evaluation unless otherwise noted.  DIAGNOSTIC FINDINGS:   Per referring provider office note (01/27/2024): "Xrays reviewed. Acute fracture noted to proximal fifth metatarsal noted through lateral half of bone just distal to tarsometatarsal joint. Fracture line appears to be nearly completely healed."   PATIENT SURVEYS:  LEFS 22/80  COGNITION: Overall cognitive status: Within functional limits for tasks assessed     SENSATION: Not tested  PALPATION: Moderate tenderness to palpation and proximal end of 5th metatarsal  LUMBAR ROM:   Active  A/PROM  eval  Flexion 80%  Extension 30%, back and hip pain reported  Right lateral flexion 75%  Left lateral flexion 75%  Right rotation 30%, stiffness  Left rotation 40%, stiffness   (Blank rows = not tested)   LOWER EXTREMITY ROM:  Active ROM Right eval Left eval  Hip flexion    Hip extension    Hip abduction    Hip adduction    Hip internal rotation    Hip external rotation    Knee flexion    Knee extension    Ankle dorsiflexion 10 -5  Ankle plantarflexion 35 28  Ankle inversion 16 14  Ankle eversion 15 3   (Blank rows = not tested)  LOWER EXTREMITY MMT:   MMT Right eval Left eval  Hip flexion    Hip extension    Hip abduction    Hip adduction    Hip internal rotation    Hip external rotation    Knee flexion    Knee extension    Ankle dorsiflexion    Ankle plantarflexion    Ankle inversion    Ankle eversion     (Blank rows = not tested)   FUNCTIONAL TESTS:  5 times sit to stand: 16  seconds with UE, reports burning in foot.   GAIT:  Distance walked: 30 ft from lobby to evaluation area Assistive device utilized: Walker - 4 wheeled Level of assistance: Complete Independence Comments: antalgic gait pattern with decreased L stance time                                                                                                                                TREATMENT DATE:   [facility] Adult PT Treatment:                                                DATE: 03/05/2024  Therapeutic Exercise: nu-step (LE only) L2 58m while taking subjective and planning session with patient L Seated heel/toe raises 2x10 10lb LAQ 2.5lb ankle weight, 2 x 10 each  Dynadisc, sitting x 30 sec (ankle weight donned) ML Dynandisc sitting x 30 sec (ankle weight donned) Seated Dynadisc circles x10 each way (ankle weight donned)  Seated marches 2 x 10  [place] Pick Ups x 3 minutes   Therapeutic Activity  STS airex 3 x 5  Seated Dynadisc circles x10 each way Towel scrunch limited ROM and alternating with toe yoga (3 x 30 sec each)   [facility] Adult PT Treatment:                                                DATE: 03/03/2024   Therapeutic Exercise: AP Dynadisc, sitting x 30 sec ML Dynandisc sitting x 30 sec Seated heel/toe raises 2x10 5lb each LE  LAQ BW x20 BIL   Neuromuscular re-ed: Seated Dynadisc circles x10 each way Towel scrunch limited ROM and alternating with toe yoga (3 x 30 sec each)  Ankle 4 way with Blue TB - 20x ea nu-step (LE only) L2 58m while taking subjective and planning session with patient     PATIENT EDUCATION:  Education details: discussed benefit of sock aid for compression sock. reviewed initial home exercise program; discussion of POC, prognosis and goals for skilled PT   Person educated: Patient Education method: Explanation, Demonstration, and Handouts Education comprehension: verbalized understanding, returned demonstration, and needs further education  HOME  EXERCISE PROGRAM: Access Code: [email or web address] URL: [email or web address] Date: 02/25/2024 Prepared by: Mayme Spearman  Program Notes Remember to look for a "Sock Aid" to help with putting on your compression socks.   Exercises - Ankle Inversion Eversion Towel Slide  - 1 x daily - 7 x weekly - 2 sets - 10 reps - Seated Toe Curl  - 1 x daily - 7 x weekly - 2 sets - 10 reps - Toe Spreading  - 1 x daily - 7 x weekly - 2 sets - 10 reps - Seated Hip Abduction with Resistance  -  1 x daily - 4-7 x weekly - 3 sets - 10 reps - Seated Hip Adduction Isometrics with Ball  - 1 x daily - 4-7 x weekly - 1 sets - 10 reps - 10 hold - Seated Heel Raise  - 1 x daily - 7 x weekly - 2 sets - 10 reps  ASSESSMENT:  CLINICAL IMPRESSION:  03/05/2024: Kenney Peacemaker had good tolerance of today's treatment session, which focused on progression of ankle ROM and strength activities. We will continue to progress per POC as tolerated, in order to reach established rehab goals.    EVAL: Kamara is a 83 y.o. female who was seen today for physical therapy evaluation and treatment for Left Foot Pain with mobility deficits, related to slow healing (>6 month) 5th metatarsal fracture. She is demonstrating decreased L ankle AROM, decreased L ankle MMT, altered gait mechanics, and decreased STS time. She has developed BIL hip and low back pain that is likely related to extended time ambulating with CAM boot, which she is currently weaning from. She has related pain and difficulty with walking, ADLs/IADLs. She also remains at risk for falls, and requires patient education and PT intervention to decrease risk of falls. She requires skilled PT services at this time to address relevant deficits and improve overall function.     OBJECTIVE IMPAIRMENTS: Abnormal gait, decreased activity tolerance, decreased balance, decreased mobility, difficulty walking, decreased ROM, decreased strength, increased edema, and pain.   ACTIVITY LIMITATIONS:  standing, squatting, and stairs  PARTICIPATION LIMITATIONS: meal prep, cleaning, laundry, shopping, and community activity  PERSONAL FACTORS: Age, Time since onset of injury/illness/exacerbation, and 3+ comorbidities: Relevant PMHx includes anxiety, arthritis, bradycardia, carotid artery occlusion, depression, DM type II, fibromyalgia, glaucoma, hearing loss, HTN, CVA, carotid stenosis, scoliosis of lumbar spine are also affecting patient's functional outcome.   REHAB POTENTIAL: Fair    CLINICAL DECISION MAKING: Evolving/moderate complexity  EVALUATION COMPLEXITY: Moderate   GOALS: Goals reviewed with patient? YES  SHORT TERM GOALS: Target date: 03/05/2024  Patient will be independent with initial home program at least 3 days/week.  Baseline: provided at eval Goal Status: INITIAL   2.  Patient will demonstrate complete 5 time Sit-to-Stand test in 14 seconds or less in order to demonstrate decreasing risk of falls.  Baseline: see objective measures Goal Status: INITIAL   3.  Patient will demonstrate improved L ankle DF AROM to at least neutral (0 degrees).  Baseline: lacking 5 degrees of DF Goal status: INITIAL   LONG TERM GOALS: Target date: 04/02/2024  Patient will report improved overall functional ability with LEFS score of 60/80 or greater.  Baseline: 22/80 Goal Status: INITIAL   2.  Patient will return to safe ambulation with [facility] for household and community ambulation.  Baseline: ambulating with antalgic gait with rollator  Goal status: INITIAL  3.  Patient will demonstrate at least 4+/5 MMT with LQ testing in L LE. Baseline: see objective measures  Goal status: INITIAL  4.  Patient will report ability to perform household chores and ADLs with no greater than 0/27 foot pain.  Baseline: up to 10/10 pain; moderate-to-severe difficulty perform activities around her home  Goal status: INITIAL     PLAN:  PT FREQUENCY: 1-2x/week  PT DURATION: 8 weeks  PLANNED  INTERVENTIONS: 97164- PT Re-evaluation, 97750- Physical Performance Testing, 97110-Therapeutic exercises, 97530- Therapeutic activity, [id number]- Neuromuscular re-education, 97535- Self Care, [id number]- Manual therapy, [phone]- Gait training, [phone]- Aquatic Therapy, [phone]- Vasopneumatic device, Patient/Family education, Balance training, Stair training, Taping, Joint mobilization,  Cryotherapy, and Moist heat  PLAN FOR NEXT SESSION:  assess LBP and relevant obj measures; FGA or DGI when appropriate; progress towards established goals (to include ankle AROM, ankle strengthening, knee/hip strengthening, weight bearing, balance, gait training, progress to [facility] when appropriate, low-impact aerobic activity, manual therapy as indicated, other modalities as indicated).    Arlester Bence, PT, DPT  03/05/2024 12:11 PM

## 2024-03-09 ENCOUNTER — Other Ambulatory Visit: Payer: Self-pay | Admitting: Nurse Practitioner

## 2024-03-09 NOTE — Progress Notes (Unsigned)
 [facility] [place]  ASSESSMENT / PLAN: [name] is a 83 y.o. female status post right TCAR 08/09/23.  She has a painful, prominent scar in her right neck consistent with hypertrophic scarring.   Recommend:  Abstinence from all tobacco products. Blood glucose control with goal A1c < 7%. Blood pressure control with goal blood pressure < 140/90 mmHg. Lipid reduction therapy with goal LDL-C <100 mg/dL  Aspirin  81mg  PO QD.  Clopidogrel  75mg  PO QD. Atorvastatin 40-80mg  PO QD (or other "high intensity" statin therapy).  Patient is very tender around the neck incision, I suspect from hypertrophic scarring.  She describes pain and tenderness with flexion extension of the neck.  We will refer her for physical therapy for help with stretching and strengthening exercises of the neck.  I counseled her that scar revision may or may not help in this situation, and we should try physical therapy first.  She is amenable.  I will see her back in 3 months. If she is still having issues at this point, will refer to plastic surgery.  CHIEF COMPLAINT: follow up surgery  HISTORY OF PRESENT ILLNESS: [name] is a 83 y.o. female admitted to the hospital with left-sided weakness. Stroke workup was initiated. 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 She reports crescendo events prior to her presenting weakness. I reviewed her angiographic findings with her in detail. We reviewed the 2 main modes of carotid artery revascularization. I ultimately recommended TCAR given the anatomic challenges with her carotid artery stenosis.   09/10/23:.  She has done quite well.  Her left foot is bothering her, but this is a chronic issue from fracture.  She has no new neurologic deficits.  We reviewed her duplex in detail.  12/10/23: Patient returns to clinic for evaluation.  The patient has reported several weeks of pain  around the right neck incision from TCAR.  She had initially done very well having no recurrent neurologic events.  She continues to deny any new stroke or mini stroke symptoms.  They healed neck incision is very tender to her.  Tenderness extends from the clavicle up the sternocleidomastoid to the base of the neck.  She has pain with movement and pain with touch of the area.  No surrounding cellulitis or other signs of infection.  Past Medical History:  Diagnosis Date   Allergy    Anal or rectal pain    sometimes   Anemia    hx of during pregnancy   Anxiety    Arthritis    Asthma    hx of   Bradycardia    " I KNOW I HAVE BRADYCARDIA ESPECIALLY WHEN I SLEEP"    Carotid artery occlusion    Cataract 2021   bilateral eyes   Chronic back pain    Degenerative joint disease    osteo   Depression    Diabetes mellitus without complication (HCC)    DM type II   Diverticulosis 2003   Dysrhythmia    hx of  due to eye drop and also low heart rate 40's per pt. Dr. Katheryne Pane follows   Elevated total protein    Esophageal dysmotility    Fibromyalgia    GERD (gastroesophageal reflux disease)    subsequent Nissen Fundoplication   Glaucoma    Hearing loss    Heart murmur    "was told she had a heart murmur"  Hemorrhoids    Hiatal hernia 11/08/2009   Hx of adenomatous colonic polyps 07/02/2002   Hypercalcemia    Hyperlipidemia    Hyperlipidemia    Hypertension    Implantable loop recorder present 11/28/2021   Nausea    Osteoporosis    PONV (postoperative nausea and vomiting)    Rectal bleeding    from hemorrhoids.     Sleep apnea    DOES USE CPAP    Thrombocytopenia (HCC)    Varicose veins of left lower extremity     Past Surgical History:  Procedure Laterality Date   CARDIAC CATHETERIZATION  03/14/1992   Normal cardiac cath. Normal LV function.   CARDIOVASCULAR STRESS TEST  01/22/2011   No scintigraphic evidence of inducible ischemia.   CAROTID DOPPLER  03/31/2007   Bilateral  ICAs - no evidence of significant diameter reduction, dissectin, tortuosity, FMD, or any other vascular abnormality.   CATARACT EXTRACTION, BILATERAL     ESOPHAGEAL MANOMETRY N/A 11/14/2015   Procedure: ESOPHAGEAL MANOMETRY (EM);  Surgeon: Sergio Dandy, MD;  Location: [facility];  Service: Endoscopy;  Laterality: N/A;   ESOPHAGEAL MANOMETRY N/A 01/18/2021   Procedure: ESOPHAGEAL MANOMETRY (EM);  Surgeon: Sergio Dandy, MD;  Location: [facility];  Service: Endoscopy;  Laterality: N/A;   ESOPHAGOGASTRODUODENOSCOPY N/A 03/08/2021   Procedure: ESOPHAGOGASTRODUODENOSCOPY (EGD);  Surgeon: Hilarie Lovely, MD;  Location: [facility] OR;  Service: Thoracic;  Laterality: N/A;   EYE SURGERY     bilateral cataracts; bilateral stents   GASTRIC RESECTION  2009   GLAUCOMA SURGERY     JOINT REPLACEMENT     right knee Dr. France Ina 06-23-18   KNEE SURGERY Bilateral    NISSEN FUNDOPLICATION  2000   with subsequent takedown in 2009   TOTAL KNEE ARTHROPLASTY Left 06/10/2017   Procedure: LEFT  TOTAL KNEE ARTHROPLASTY;  Surgeon: Liliane Rei, MD;  Location: [facility];  Service: Orthopedics;  Laterality: Left;  Adductor Block   TOTAL KNEE ARTHROPLASTY Right 06/23/2018   Procedure: RIGHT TOTAL KNEE ARTHROPLASTY;  Surgeon: Liliane Rei, MD;  Location: [facility];  Service: Orthopedics;  Laterality: Right;   TRANSCAROTID ARTERY REVASCULARIZATION  Right 08/09/2023   Procedure: TRANSCAROTID ARTERY REVASCULARIZATION;  Surgeon: Carlene Che, MD;  Location: [facility] OR;  Service: Vascular;  Laterality: Right;   TRANSTHORACIC ECHOCARDIOGRAM  12/21/2010   EF 60%, moderate LVH,    TUBAL LIGATION     XI ROBOTIC ASSISTED HIATAL HERNIA REPAIR N/A 03/08/2021   Procedure: XI ROBOTIC ASSISTED LAPAROSCOPY WITH LYSIS OF ADHESIONS;  Surgeon: Hilarie Lovely, MD;  Location: [facility] OR;  Service: Thoracic;  Laterality: N/A;    Family History  Problem Relation Age of Onset   Heart disease Mother    Hypertension Mother    Diabetes  Mother    Diabetes Father    Kidney disease Father    Stroke Brother    Cancer Maternal Grandmother    Kidney disease Maternal Grandfather    Breast cancer Daughter    Stomach cancer Maternal Aunt    Uterine cancer Maternal Aunt    Leukemia Maternal Uncle    Prostate cancer Maternal Uncle    Colon cancer Neg Hx    Rectal cancer Neg Hx    Esophageal cancer Neg Hx     Social History   Socioeconomic History   Marital status: Married    Spouse name: Not on file   Number of children: 8   Years of education: Not on file   Highest education level:  Not on file  Occupational History   Occupation: retired    Comment: retired Clinical biochemist.   Tobacco Use   Smoking status: Never   Smokeless tobacco: Never  Vaping Use   Vaping status: Never Used  Substance and Sexual Activity   Alcohol  use: No    Alcohol /week: 0.0 standard drinks of alcohol    Drug use: No   Sexual activity: Yes  Other Topics Concern   Not on file  Social History Narrative   Husband, Mervin Contee is Next of Kin. Cell # [phone]      Right handed    Wear glasses drinks    Social Drivers of Health   Financial Resource Strain: Not on file  Food Insecurity: No Food Insecurity (08/07/2023)   Hunger Vital Sign    Worried About Running Out of Food in the Last Year: Never true    Ran Out of Food in the Last Year: Never true  Transportation Needs: No Transportation Needs (08/07/2023)   PRAPARE - Administrator, Civil Service (Medical): No    Lack of Transportation (Non-Medical): No  Physical Activity: Not on file  Stress: Not on file  Social Connections: Not on file  Intimate Partner Violence: Not At Risk (08/07/2023)   Humiliation, Afraid, Rape, and Kick questionnaire    Fear of Current or Ex-Partner: No    Emotionally Abused: No    Physically Abused: No    Sexually Abused: No    Allergies  Allergen Reactions   Adalat [Nifedipine] Other (See Comments)    Tired, Made pt "feel crazy in the  head"   Hydrocodone  Itching and Nausea And Vomiting   Oxycodone -Acetaminophen  Itching   Meperidine Hcl Other (See Comments)    Demerol causes hallucinations   Naproxen Nausea And Vomiting    Current Outpatient Medications  Medication Sig Dispense Refill   acetaminophen  (TYLENOL ) 500 MG tablet Take 1,000 mg by mouth 2 (two) times daily as needed for headache (back and knee pain).     albuterol  (VENTOLIN  HFA) 108 (90 Base) MCG/ACT inhaler Inhale 1 puff into the lungs as needed for wheezing or shortness of breath.     amLODipine (NORVASC) 10 MG tablet Take 10 mg by mouth daily. Per note patient taking for 90 days.     ascorbic acid  (VITAMIN C ) 500 MG tablet Take 1 tablet (500 mg total) by mouth daily. 90 tablet 0   brimonidine  (ALPHAGAN ) 0.2 % ophthalmic solution Place 1 drop into both eyes 2 (two) times daily.     cetirizine (ZYRTEC) 10 MG tablet Take 10 mg by mouth at bedtime.     Cholecalciferol  (VITAMIN D3) 250 MCG (10000 UT) capsule Take 10,000 Units by mouth daily.     clopidogrel  (PLAVIX ) 75 MG tablet Take 1 tablet (75 mg total) by mouth daily. 30 tablet 1   dexlansoprazole  (DEXILANT ) 60 MG capsule Take 1 capsule (60 mg total) by mouth daily. 90 capsule 3   Evolocumab  (REPATHA  SURECLICK) 140 MG/ML SOAJ Inject 140 mg into the skin every 14 (fourteen) days. 2 mL 5   ezetimibe  (ZETIA ) 10 MG tablet Take 1 tablet (10 mg total) by mouth daily. 30 tablet 0   hydrocortisone  (ANUSOL -HC) 25 MG suppository Place 1 suppository (25 mg total) rectally at bedtime. 5 suppository 1   indomethacin  (INDOCIN ) 50 MG capsule PLEASE SEE ATTACHED FOR DETAILED DIRECTIONS 90 capsule 0   ketoconazole  (NIZORAL ) 2 % cream Apply 1 Application topically daily. 60 g 2   latanoprost  (XALATAN ) 0.005 %  ophthalmic solution Place 1 drop into the right eye at bedtime.     metFORMIN (GLUCOPHAGE) 500 MG tablet Take 500 mg by mouth every morning. (Patient not taking: Reported on 02/06/2024)     mirtazapine (REMERON) 7.5 MG  tablet Take 7.5 mg by mouth at bedtime. (Patient not taking: Reported on 02/06/2024)     montelukast  (SINGULAIR ) 10 MG tablet Take 10 mg by mouth at bedtime.     nystatin ointment (MYCOSTATIN) Apply 1 application  topically See admin instructions. 1 application 1-2 times a day as needed for rash  0   OZEMPIC, 0.25 OR 0.5 MG/DOSE, 2 MG/3ML SOPN SMARTSIG:0.25 Milligram(s) SUB-Q Once a Week     Polyethyl Glycol-Propyl Glycol (SYSTANE OP) Place 1 drop into both eyes daily as needed (dry/irritated eyes).     polyethylene glycol powder (GLYCOLAX /MIRALAX ) 17 GM/SCOOP powder Take 17 g by mouth daily. 476 g 0   rosuvastatin  (CRESTOR ) 40 MG tablet Take 40 mg by mouth at bedtime.     senna-docusate (SENOKOT-S) 8.6-50 MG tablet Take 2 tablets by mouth 2 (two) times daily. 60 tablet 0   sertraline  (ZOLOFT ) 100 MG tablet Take 100 mg by mouth every morning.     sucralfate  (CARAFATE ) 1 g tablet Take 1 tablet (1 g total) by mouth 2 (two) times daily as needed. Do not take within 2 hours of any other medications. 60 tablet 3   telmisartan (MICARDIS) 80 MG tablet Take 80 mg by mouth every morning.     Zinc  50 MG TABS Take 220 mg by mouth daily.     zinc  sulfate 220 (50 Zn) MG capsule Take 1 capsule (220 mg total) by mouth daily. 90 capsule 0   No current facility-administered medications for this visit.    PHYSICAL EXAM There were no vitals filed for this visit.   Elderly woman.  Appears younger than stated age. Palpable radial pulses bilaterally Right neck incision with hypertrophic scar that does appear adhesed to local soft tissue structures. Scar is very tender to touch    PERTINENT LABORATORY AND RADIOLOGIC DATA  Most recent CBC    Latest Ref Rng & Units 08/17/2023    9:58 PM 08/11/2023    3:29 AM 08/10/2023    5:45 AM  CBC  WBC 4.0 - 10.5 K/uL 6.4  6.1  7.3   Hemoglobin 12.0 - 15.0 g/dL 60.4  54.0  98.1   Hematocrit 36.0 - 46.0 % 38.6  34.0  34.9   Platelets 150 - 400 K/uL 201  149  167       Most recent CMP    Latest Ref Rng & Units 08/17/2023    9:58 PM 08/11/2023    3:29 AM 08/10/2023    5:45 AM  CMP  Glucose 70 - 99 mg/dL 191  478  295   BUN 8 - 23 mg/dL 16  13  13    Creatinine 0.44 - 1.00 mg/dL 6.21  3.08  6.57   Sodium 135 - 145 mmol/L 137  139  137   Potassium 3.5 - 5.1 mmol/L 4.2  3.9  4.0   Chloride 98 - 111 mmol/L 104  107  105   CO2 22 - 32 mmol/L 24  24  22    Calcium  8.9 - 10.3 mg/dL 9.6  8.7  8.7     Renal function CrCl cannot be calculated (Patient's most recent lab result is older than the maximum 21 days allowed.).  Hgb A1c MFr Bld (%)  Date Value  08/07/2023 6.8 (H)    LDL Chol Calc (NIH)  Date Value Ref Range Status  10/26/2020 115 (H) 0 - 99 mg/dL Final   LDL Cholesterol  Date Value Ref Range Status  08/10/2023 71 0 - 99 mg/dL Final    Comment:           Total Cholesterol/HDL:CHD Risk Coronary Heart Disease Risk Table                     Men   Women  1/2 Average Risk   3.4   3.3  Average Risk       5.0   4.4  2 X Average Risk   9.6   7.1  3 X Average Risk  23.4   11.0        Use the calculated Patient Ratio above and the CHD Risk Table to determine the patient's CHD Risk.        ATP III CLASSIFICATION (LDL):  <100     mg/dL   Optimal  161-096  mg/dL   Near or Above                    Optimal  130-159  mg/dL   Borderline  045-409  mg/dL   High  >811     mg/dL   Very High Performed at [facility], [address] [place], [place] [address]     Carotid duplex 09/06/2023  Summary:  Right Carotid: Patent stent with no evidence for restenosis.   Left Carotid: Velocities in the left ICA are consistent with a 1-39%  stenosis.   Vertebrals: Bilateral vertebral arteries demonstrate antegrade flow.  Subclavians: Normal flow hemodynamics were seen in bilateral subclavian               arteries.   Heber Little. Edgardo Goodwill, MD [facility] Phone Number: [phone] 03/09/2024 7:12  PM   Total time spent on preparing this encounter including chart review, data review, collecting history, examining the patient, coordinating care for this established patient, 30 minutes.  Portions of this report may have been transcribed using voice recognition software.  Every effort has been made to ensure accuracy; however, inadvertent computerized transcription errors may still be present.

## 2024-03-10 ENCOUNTER — Ambulatory Visit (HOSPITAL_COMMUNITY)
Admission: RE | Admit: 2024-03-10 | Discharge: 2024-03-10 | Disposition: A | Discharge status: Home / Self Care | Payer: Medicare Other | Source: Ambulatory Visit | Attending: Vascular Surgery | Admitting: Vascular Surgery

## 2024-03-10 ENCOUNTER — Ambulatory Visit: Payer: Self-pay | Admitting: Physical Therapy

## 2024-03-10 ENCOUNTER — Ambulatory Visit (INDEPENDENT_AMBULATORY_CARE_PROVIDER_SITE_OTHER): Payer: Medicare Other | Admitting: Vascular Surgery

## 2024-03-10 ENCOUNTER — Encounter: Payer: Self-pay | Admitting: Vascular Surgery

## 2024-03-10 VITALS — BP 166/92 | HR 57 | Temp 97.9°F | Ht 70.0 in | Wt 216.0 lb

## 2024-03-10 DIAGNOSIS — Z95828 Presence of other vascular implants and grafts: Secondary | ICD-10-CM | POA: Insufficient documentation

## 2024-03-10 DIAGNOSIS — L910 Hypertrophic scar: Secondary | ICD-10-CM | POA: Insufficient documentation

## 2024-03-11 NOTE — Therapy (Signed)
 OUTPATIENT PHYSICAL THERAPY DAILY NOTE   Patient Name: [name] MRN: [id number] DOB:Aug 19, 1941, 83 y.o., female Today's Date: 03/12/2024  END OF SESSION:  PT End of Session - 03/12/24 0959     Visit Number 8    Number of Visits 16    Date for PT Re-Evaluation 04/02/24    Authorization Type MCR    Progress Note Due on Visit 10    PT Start Time 1000    PT Stop Time 1038    PT Time Calculation (min) 38 min    Activity Tolerance Patient tolerated treatment well                  Past Medical History:  Diagnosis Date   Allergy    Anal or rectal pain    sometimes   Anemia    hx of during pregnancy   Anxiety    Arthritis    Asthma    hx of   Bradycardia    " I KNOW I HAVE BRADYCARDIA ESPECIALLY WHEN I SLEEP"    Carotid artery occlusion    Cataract 2021   bilateral eyes   Chronic back pain    Degenerative joint disease    osteo   Depression    Diabetes mellitus without complication (HCC)    DM type II   Diverticulosis 2003   Dysrhythmia    hx of  due to eye drop and also low heart rate 40's per pt. Dr. Katheryne Pane follows   Elevated total protein    Esophageal dysmotility    Fibromyalgia    GERD (gastroesophageal reflux disease)    subsequent Nissen Fundoplication   Glaucoma    Hearing loss    Heart murmur    "was told she had a heart murmur"   Hemorrhoids    Hiatal hernia 11/08/2009   Hx of adenomatous colonic polyps 07/02/2002   Hypercalcemia    Hyperlipidemia    Hyperlipidemia    Hypertension    Implantable loop recorder present 11/28/2021   Nausea    Osteoporosis    PONV (postoperative nausea and vomiting)    Rectal bleeding    from hemorrhoids.     Sleep apnea    DOES USE CPAP    Thrombocytopenia (HCC)    Varicose veins of left lower extremity    Past Surgical History:  Procedure Laterality Date   CARDIAC CATHETERIZATION  03/14/1992   Normal cardiac cath. Normal LV function.   CARDIOVASCULAR STRESS TEST  01/22/2011   No scintigraphic  evidence of inducible ischemia.   CAROTID DOPPLER  03/31/2007   Bilateral ICAs - no evidence of significant diameter reduction, dissectin, tortuosity, FMD, or any other vascular abnormality.   CATARACT EXTRACTION, BILATERAL     ESOPHAGEAL MANOMETRY N/A 11/14/2015   Procedure: ESOPHAGEAL MANOMETRY (EM);  Surgeon: Sergio Dandy, MD;  Location: [facility];  Service: Endoscopy;  Laterality: N/A;   ESOPHAGEAL MANOMETRY N/A 01/18/2021   Procedure: ESOPHAGEAL MANOMETRY (EM);  Surgeon: Sergio Dandy, MD;  Location: [facility];  Service: Endoscopy;  Laterality: N/A;   ESOPHAGOGASTRODUODENOSCOPY N/A 03/08/2021   Procedure: ESOPHAGOGASTRODUODENOSCOPY (EGD);  Surgeon: Hilarie Lovely, MD;  Location: [facility] OR;  Service: Thoracic;  Laterality: N/A;   EYE SURGERY     bilateral cataracts; bilateral stents   GASTRIC RESECTION  2009   GLAUCOMA SURGERY     JOINT REPLACEMENT     right knee Dr. France Ina 06-23-18   KNEE SURGERY Bilateral    NISSEN FUNDOPLICATION  2000  with subsequent takedown in 2009   TOTAL KNEE ARTHROPLASTY Left 06/10/2017   Procedure: LEFT  TOTAL KNEE ARTHROPLASTY;  Surgeon: Liliane Rei, MD;  Location: [facility];  Service: Orthopedics;  Laterality: Left;  Adductor Block   TOTAL KNEE ARTHROPLASTY Right 06/23/2018   Procedure: RIGHT TOTAL KNEE ARTHROPLASTY;  Surgeon: Liliane Rei, MD;  Location: [facility];  Service: Orthopedics;  Laterality: Right;   TRANSCAROTID ARTERY REVASCULARIZATION  Right 08/09/2023   Procedure: TRANSCAROTID ARTERY REVASCULARIZATION;  Surgeon: Carlene Che, MD;  Location[facility] OR;  Service: Vascular;  Laterality: Right;   TRANSTHORACIC ECHOCARDIOGRAM  12/21/2010   EF 60%, moderate LVH,    TUBAL LIGATION     XI ROBOTIC ASSISTED HIATAL HERNIA REPAIR N/A 03/08/2021   Procedure: XI ROBOTIC ASSISTED LAPAROSCOPY WITH LYSIS OF ADHESIONS;  Surgeon: Hilarie Lovely, MD;  Location: [facility] OR;  Service: Thoracic;  Laterality: N/A;   Patient Active Problem List    Diagnosis Date Noted   Closed nondisplaced fracture of fifth metatarsal bone of left foot 08/07/2023   Stroke (HCC) 08/07/2023   Carotid stenosis, asymptomatic, right 08/07/2023   CVA (cerebral vascular accident) (HCC) 08/06/2023   Syncope 10/22/2022   Acute renal failure superimposed on stage 3a chronic kidney disease (HCC) 05/22/2022   AKI (acute kidney injury) (HCC) 05/21/2022   Hiatal hernia 03/08/2021   Heartburn    Ineffective esophageal motility    Dyspnea on exertion 07/24/2020   Change in bowel habits 08/22/2018   Knee stiff 07/23/2018   Degeneration of lumbar intervertebral disc 02/04/2018   Scoliosis of lumbar spine 02/04/2018   Sinus bradycardia 07/27/2016   Cough    Essential hypertension 11/24/2014   Gonalgia 05/13/2013   OA (osteoarthritis) of knee 05/13/2013   Degenerative joint disease involving multiple joints 01/19/2013   Rheumatoid arthritis (HCC) 01/19/2013   Thyroid  nodule 06/11/2012   Elevated total protein    Cyst of thyroid  10/29/2011   Hemorrhoids, internal 05/28/2011   Acid reflux 02/14/2011   Avitaminosis D 02/13/2011   Glaucoma 11/28/2010   HLD (hyperlipidemia) 12/14/2009   Internal and external bleeding hemorrhoids 12/14/2009   DIVERTICULOSIS, COLON 12/14/2009   Joint pain 12/14/2009   BACK PAIN, CHRONIC 12/14/2009   FIBROMYALGIA 12/14/2009   OSTEOPOROSIS 12/14/2009   Sleep apnea 12/14/2009   History of colonic polyps 12/14/2009   GASTRIC POLYP, HX OF 12/14/2009   Diaphragmatic hernia 10/04/2009   Depression 09/29/2009   REFLUX ESOPHAGITIS 09/29/2009   GERD 09/29/2009   Constipation 09/29/2009   DYSPHAGIA 09/29/2009    PCP: Nohemi Batters, MD  REFERRING PROVIDER: Jennefer Moats, DPM  REFERRING DIAG: 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 Was a bit sore/painful after last session but back to baseline by next day. States she isn't really using boot anymore.     EVAL: Patient presents to PT with history of slow healing 5th metatarsal fracture after a fall about 6 months ago. She states that she doesn't have a clear memory of the fall. "The doctors thought that I had a stroke. I went to lock the door and the next thing that I remember is that I was on the  floor and partially on the end table."  She states that she is still using the bone stimulator as instructed by referring provider. She is now able to wean from CAM boot. She states that she started walking to the bathroom without the boot at home. Today is her first day ambulating outside of the house without the CAM boot. She additionally reports hip and back pain over the past several months while ambulating with CAM boot and rollator.      PERTINENT HISTORY: Relevant PMHx includes anxiety, arthritis, bradycardia, carotid artery occlusion, depression, DM type II, fibromyalgia, glaucoma, hearing loss, HTN, CVA, carotid stenosis, scoliosis of lumbar spine   PAIN:  Are you having pain?  Yes: NPRS scale: 2-3/10 current, 10/10 (occasionally, nerve pain) Pain location: L foot (lateral aspect), hips, lower back   Pain description: dull ache  Aggravating factors: weight bearing Relieving factors: sitting, laying down  PRECAUTIONS: Fall  RED FLAGS: None   WEIGHT BEARING RESTRICTIONS: Yes WBAT, wean/discontinue CAM boot and transition to regular shoes  FALLS:  Has patient fallen in last 6 months? Yes. Number of falls 1, see MOI   LIVING ENVIRONMENT: Lives with: lives with their spouse Lives in: House/apartment Stairs: Yes: two external steps with railing   Has following equipment at home: Single point cane and Walker - 4 wheeled for community ambulation   OCCUPATION: n/a   PLOF: Independent return to pain-free household mobility, ambulation, household chores   PATIENT GOALS: To return to ambulating with regular shoes and cane; address pain in foot, hip/back  NEXT MD VISIT: 03/30/2024  OBJECTIVE:  Note: Objective measures were completed at Evaluation unless otherwise noted.  DIAGNOSTIC FINDINGS:   Per referring provider office note (01/27/2024): "Xrays reviewed. Acute fracture noted to proximal fifth metatarsal noted through lateral half of bone just distal to tarsometatarsal joint. Fracture line appears to be nearly completely healed."   PATIENT SURVEYS:  LEFS 22/80  COGNITION: Overall cognitive status: Within functional limits for tasks assessed     SENSATION: Not tested  PALPATION: Moderate tenderness to palpation and proximal end of 5th metatarsal  LUMBAR ROM:   Active  A/PROM  eval  Flexion 80%  Extension 30%, back and hip pain reported  Right lateral flexion 75%  Left lateral flexion 75%  Right rotation 30%, stiffness  Left rotation 40%, stiffness   (Blank rows = not tested)   LOWER EXTREMITY ROM:  Active ROM Right eval Left eval Left 03/12/24  Hip flexion     Hip extension     Hip abduction     Hip adduction     Hip internal rotation     Hip external rotation     Knee flexion     Knee extension     Ankle dorsiflexion 10 -5 A: lacking 2 deg  P: neutral  Ankle plantarflexion 35 28   Ankle inversion 16 14   Ankle eversion 15 3    (Blank rows = not tested)  LOWER EXTREMITY MMT:   MMT Right eval Left eval  Hip flexion    Hip extension    Hip abduction    Hip adduction    Hip internal rotation    Hip external rotation    Knee flexion    Knee extension    Ankle dorsiflexion    Ankle plantarflexion    Ankle inversion    Ankle eversion     (Blank rows = not tested)   FUNCTIONAL TESTS:  5  times sit to stand: 16  seconds with UE, reports burning in foot.   03/12/24: 5xSTS 11sec w UE support, no foot pain  GAIT: Distance walked: 30 ft from lobby to evaluation area Assistive device utilized: Environmental consultant - 4 wheeled Level of assistance: Complete Independence Comments: antalgic gait pattern with decreased L stance time                                                                                                                                TREATMENT DATE:   [facility] Adult PT Treatment:                                                DATE: 03/12/24 Therapeutic Exercise: Nu step LE only L3 during subjective  Seated heel/toe raises 2x15 unweighted BIL cues for comfortable ROM HEP discussion/education   Therapeutic Activity: ROM measurements + education STS x8 from chair w/ UE support cues for pacing and posture 5xSTS + education Standing cone march 2x8 BIL w UE support Fwd LLE weight shift onto 4 inch step w/ UE support, cues for more downward force rather than anterior     [facility] Adult PT Treatment:                                                DATE: 03/05/2024  Therapeutic Exercise: nu-step (LE only) L2 31m while taking subjective and planning session with patient L Seated heel/toe raises 2x10 10lb LAQ 2.5lb ankle weight, 2 x 10 each  Dynadisc, sitting x 30 sec (ankle weight donned) ML Dynandisc sitting x 30 sec (ankle weight donned) Seated Dynadisc circles x10 each way (ankle weight donned)  Seated marches 2 x 10  [place] Pick Ups x 3 minutes   Therapeutic Activity  STS airex 3 x 5  Seated Dynadisc circles x10 each way Towel scrunch limited ROM and alternating with toe yoga (3 x 30 sec each)   [facility] Adult PT Treatment:                                                DATE: 03/03/2024   Therapeutic Exercise: AP Dynadisc, sitting x 30 sec ML Dynandisc sitting x 30 sec Seated heel/toe raises 2x10 5lb each LE  LAQ BW x20 BIL   Neuromuscular re-ed: Seated Dynadisc  circles x10 each way Towel scrunch limited ROM and alternating with toe yoga (3 x 30 sec each)  Ankle 4 way with Blue TB - 20x ea nu-step (LE only) L2 35m while taking subjective and planning session with patient  PATIENT EDUCATION:  Education details: rationale for interventions, HEP  Person educated: Patient Education method: Explanation, Demonstration, Tactile cues, Verbal cues Education comprehension: verbalized understanding, returned demonstration, verbal cues required, tactile cues required, and needs further education     HOME EXERCISE PROGRAM: Access Code: [email or web address] URL: [email or web address] Date: 02/25/2024 Prepared by: Mayme Spearman  Program Notes Remember to look for a "Sock Aid" to help with putting on your compression socks.   Exercises - Ankle Inversion Eversion Towel Slide  - 1 x daily - 7 x weekly - 2 sets - 10 reps - Seated Toe Curl  - 1 x daily - 7 x weekly - 2 sets - 10 reps - Toe Spreading  - 1 x daily - 7 x weekly - 2 sets - 10 reps - Seated Hip Abduction with Resistance  - 1 x daily - 4-7 x weekly - 3 sets - 10 reps - Seated Hip Adduction Isometrics with Ball  - 1 x daily - 4-7 x weekly - 1 sets - 10 reps - 10 hold - Seated Heel Raise  - 1 x daily - 7 x weekly - 2 sets - 10 reps  ASSESSMENT:  CLINICAL IMPRESSION:  03/12/2024: Pt arrives w/ minimal pain, continuing to progress for increased volume with familiar exercises and inc time in WB. Tolerates well with cues as above, no adverse events and reports mild fatigue but no increase in resting pain on departure. Modest improvements in ROM and excellent improvement in 5xSTS compared to initial eval. Recommend continuing along current POC in order to address relevant deficits and improve functional tolerance. Pt departs today's session in no acute distress, all voiced questions/concerns addressed appropriately from PT perspective.     EVAL: [name] is a 83 y.o. female who was seen today for physical  therapy evaluation and treatment for Left Foot Pain with mobility deficits, related to slow healing (>6 month) 5th metatarsal fracture. She is demonstrating decreased L ankle AROM, decreased L ankle MMT, altered gait mechanics, and decreased STS time. She has developed BIL hip and low back pain that is likely related to extended time ambulating with CAM boot, which she is currently weaning from. She has related pain and difficulty with walking, ADLs/IADLs. She also remains at risk for falls, and requires patient education and PT intervention to decrease risk of falls. She requires skilled PT services at this time to address relevant deficits and improve overall function.     OBJECTIVE IMPAIRMENTS: Abnormal gait, decreased activity tolerance, decreased balance, decreased mobility, difficulty walking, decreased ROM, decreased strength, increased edema, and pain.   ACTIVITY LIMITATIONS: standing, squatting, and stairs  PARTICIPATION LIMITATIONS: meal prep, cleaning, laundry, shopping, and community activity  PERSONAL FACTORS: Age, Time since onset of injury/illness/exacerbation, and 3+ comorbidities: Relevant PMHx includes anxiety, arthritis, bradycardia, carotid artery occlusion, depression, DM type II, fibromyalgia, glaucoma, hearing loss, HTN, CVA, carotid stenosis, scoliosis of lumbar spine are also affecting patient's functional outcome.   REHAB POTENTIAL: Fair    CLINICAL DECISION MAKING: Evolving/moderate complexity  EVALUATION COMPLEXITY: Moderate   GOALS: Goals reviewed with patient? YES  SHORT TERM GOALS: Target date: 03/05/2024  Patient will be independent with initial home program at least 3 days/week.  Baseline: provided at eval 03/12/24: reports good HEP adherence Goal Status: met   2.  Patient will demonstrate complete 5 time Sit-to-Stand test in 14 seconds or less in order to demonstrate decreasing risk of falls.  Baseline: see objective measures 03/12/24: 11 sec Goal Status:  met  3.  Patient will demonstrate improved L ankle DF AROM to at least neutral (0 degrees).  Baseline: lacking 5 degrees of DF 03/12/24: lacking 2 actively Goal Status: PROGRESSING   LONG TERM GOALS: Target date: 04/02/2024  Patient will report improved overall functional ability with LEFS score of 60/80 or greater.  Baseline: 22/80 Goal Status: INITIAL   2.  Patient will return to safe ambulation with [facility] for household and community ambulation.  Baseline: ambulating with antalgic gait with rollator  Goal status: INITIAL  3.  Patient will demonstrate at least 4+/5 MMT with LQ testing in L LE. Baseline: see objective measures  Goal status: INITIAL  4.  Patient will report ability to perform household chores and ADLs with no greater than 4/09 foot pain.  Baseline: up to 10/10 pain; moderate-to-severe difficulty perform activities around her home  Goal status: INITIAL     PLAN:  PT FREQUENCY: 1-2x/week  PT DURATION: 8 weeks  PLANNED INTERVENTIONS: [id number]- PT Re-evaluation, 97750- Physical Performance Testing, 97110-Therapeutic exercises, 97530- Therapeutic activity, [id number]- Neuromuscular re-education, 97535- Self Care, [id number]- Manual therapy, [phone]- Gait training, [phone]- Aquatic Therapy, [phone]- Vasopneumatic device, Patient/Family education, Balance training, Stair training, Taping, Joint mobilization, Cryotherapy, and Moist heat  PLAN FOR NEXT SESSION:  assess LBP and relevant obj measures; FGA or DGI when appropriate; progress towards established goals (to include ankle AROM, ankle strengthening, knee/hip strengthening, weight bearing, balance, gait training, progress to [facility] when appropriate, low-impact aerobic activity, manual therapy as indicated, other modalities as indicated).    Lovett Ruck PT, DPT 03/12/2024 10:39 AM

## 2024-03-12 ENCOUNTER — Ambulatory Visit: Payer: Self-pay | Admitting: Physical Therapy

## 2024-03-12 ENCOUNTER — Encounter: Payer: Self-pay | Admitting: Physical Therapy

## 2024-03-12 DIAGNOSIS — M25552 Pain in left hip: Secondary | ICD-10-CM | POA: Diagnosis not present

## 2024-03-12 DIAGNOSIS — M25551 Pain in right hip: Secondary | ICD-10-CM | POA: Diagnosis not present

## 2024-03-12 DIAGNOSIS — R262 Difficulty in walking, not elsewhere classified: Secondary | ICD-10-CM

## 2024-03-12 DIAGNOSIS — M25572 Pain in left ankle and joints of left foot: Secondary | ICD-10-CM | POA: Diagnosis not present

## 2024-03-12 DIAGNOSIS — M5416 Radiculopathy, lumbar region: Secondary | ICD-10-CM | POA: Diagnosis not present

## 2024-03-12 NOTE — Progress Notes (Signed)
 [facility] Summary Report / Loop Recorder

## 2024-03-12 NOTE — Addendum Note (Signed)
 Addended by: Edra Govern D on: 03/12/2024 03:55 PM   Modules accepted: Orders

## 2024-03-17 ENCOUNTER — Ambulatory Visit: Payer: Self-pay

## 2024-03-19 ENCOUNTER — Ambulatory Visit: Payer: Self-pay | Attending: Podiatry

## 2024-03-19 DIAGNOSIS — M5416 Radiculopathy, lumbar region: Secondary | ICD-10-CM | POA: Diagnosis not present

## 2024-03-19 DIAGNOSIS — M25572 Pain in left ankle and joints of left foot: Secondary | ICD-10-CM | POA: Diagnosis not present

## 2024-03-19 DIAGNOSIS — R262 Difficulty in walking, not elsewhere classified: Secondary | ICD-10-CM

## 2024-03-19 NOTE — Therapy (Signed)
 OUTPATIENT PHYSICAL THERAPY DAILY NOTE   Patient Name: KORYN CHARLOT MRN: [id number] DOB:1941-04-07, 83 y.o., female Today's Date: 03/19/2024  END OF SESSION:  PT End of Session - 03/19/24 1141     Visit Number 9    Number of Visits 16    Date for PT Re-Evaluation 04/02/24    Authorization Type MCR    Progress Note Due on Visit 10    PT Start Time 1133    PT Stop Time 1211    PT Time Calculation (min) 38 min    Activity Tolerance Patient tolerated treatment well    Behavior During Therapy WFL for tasks assessed/performed                   Past Medical History:  Diagnosis Date   Allergy    Anal or rectal pain    sometimes   Anemia    hx of during pregnancy   Anxiety    Arthritis    Asthma    hx of   Bradycardia    " I KNOW I HAVE BRADYCARDIA ESPECIALLY WHEN I SLEEP"    Carotid artery occlusion    Cataract 2021   bilateral eyes   Chronic back pain    Degenerative joint disease    osteo   Depression    Diabetes mellitus without complication (HCC)    DM type II   Diverticulosis 2003   Dysrhythmia    hx of  due to eye drop and also low heart rate 40's per pt. Dr. Katheryne Pane follows   Elevated total protein    Esophageal dysmotility    Fibromyalgia    GERD (gastroesophageal reflux disease)    subsequent Nissen Fundoplication   Glaucoma    Hearing loss    Heart murmur    "was told she had a heart murmur"   Hemorrhoids    Hiatal hernia 11/08/2009   Hx of adenomatous colonic polyps 07/02/2002   Hypercalcemia    Hyperlipidemia    Hyperlipidemia    Hypertension    Implantable loop recorder present 11/28/2021   Nausea    Osteoporosis    PONV (postoperative nausea and vomiting)    Rectal bleeding    from hemorrhoids.     Sleep apnea    DOES USE CPAP    Thrombocytopenia (HCC)    Varicose veins of left lower extremity    Past Surgical History:  Procedure Laterality Date   CARDIAC CATHETERIZATION  03/14/1992   Normal cardiac cath. Normal LV function.    CARDIOVASCULAR STRESS TEST  01/22/2011   No scintigraphic evidence of inducible ischemia.   CAROTID DOPPLER  03/31/2007   Bilateral ICAs - no evidence of significant diameter reduction, dissectin, tortuosity, FMD, or any other vascular abnormality.   CATARACT EXTRACTION, BILATERAL     ESOPHAGEAL MANOMETRY N/A 11/14/2015   Procedure: ESOPHAGEAL MANOMETRY (EM);  Surgeon: Sergio Dandy, MD;  Location: [facility];  Service: Endoscopy;  Laterality: N/A;   ESOPHAGEAL MANOMETRY N/A 01/18/2021   Procedure: ESOPHAGEAL MANOMETRY (EM);  Surgeon: Sergio Dandy, MD;  Location: [facility];  Service: Endoscopy;  Laterality: N/A;   ESOPHAGOGASTRODUODENOSCOPY N/A 03/08/2021   Procedure: ESOPHAGOGASTRODUODENOSCOPY (EGD);  Surgeon: Hilarie Lovely, MD;  Location: [facility] OR;  Service: Thoracic;  Laterality: N/A;   EYE SURGERY     bilateral cataracts; bilateral stents   GASTRIC RESECTION  2009   GLAUCOMA SURGERY     JOINT REPLACEMENT     right knee Dr. France Ina 06-23-18  KNEE SURGERY Bilateral    NISSEN FUNDOPLICATION  2000   with subsequent takedown in 2009   TOTAL KNEE ARTHROPLASTY Left 06/10/2017   Procedure: LEFT  TOTAL KNEE ARTHROPLASTY;  Surgeon: Liliane Rei, MD;  Location: [facility];  Service: Orthopedics;  Laterality: Left;  Adductor Block   TOTAL KNEE ARTHROPLASTY Right 06/23/2018   Procedure: RIGHT TOTAL KNEE ARTHROPLASTY;  Surgeon: Liliane Rei, MD;  Location: [facility];  Service: Orthopedics;  Laterality: Right;   TRANSCAROTID ARTERY REVASCULARIZATION  Right 08/09/2023   Procedure: TRANSCAROTID ARTERY REVASCULARIZATION;  Surgeon: Carlene Che, MD;  Location: [facility] OR;  Service: Vascular;  Laterality: Right;   TRANSTHORACIC ECHOCARDIOGRAM  12/21/2010   EF 60%, moderate LVH,    TUBAL LIGATION     XI ROBOTIC ASSISTED HIATAL HERNIA REPAIR N/A 03/08/2021   Procedure: XI ROBOTIC ASSISTED LAPAROSCOPY WITH LYSIS OF ADHESIONS;  Surgeon: Hilarie Lovely, MD;  Location: [facility] OR;  Service: Thoracic;   Laterality: N/A;   Patient Active Problem List   Diagnosis Date Noted   Closed nondisplaced fracture of fifth metatarsal bone of left foot 08/07/2023   Stroke (HCC) 08/07/2023   Carotid stenosis, asymptomatic, right 08/07/2023   CVA (cerebral vascular accident) (HCC) 08/06/2023   Syncope 10/22/2022   Acute renal failure superimposed on stage 3a chronic kidney disease (HCC) 05/22/2022   AKI (acute kidney injury) (HCC) 05/21/2022   Hiatal hernia 03/08/2021   Heartburn    Ineffective esophageal motility    Dyspnea on exertion 07/24/2020   Change in bowel habits 08/22/2018   Knee stiff 07/23/2018   Degeneration of lumbar intervertebral disc 02/04/2018   Scoliosis of lumbar spine 02/04/2018   Sinus bradycardia 07/27/2016   Cough    Essential hypertension 11/24/2014   Gonalgia 05/13/2013   OA (osteoarthritis) of knee 05/13/2013   Degenerative joint disease involving multiple joints 01/19/2013   Rheumatoid arthritis (HCC) 01/19/2013   Thyroid  nodule 06/11/2012   Elevated total protein    Cyst of thyroid  10/29/2011   Hemorrhoids, internal 05/28/2011   Acid reflux 02/14/2011   Avitaminosis D 02/13/2011   Glaucoma 11/28/2010   HLD (hyperlipidemia) 12/14/2009   Internal and external bleeding hemorrhoids 12/14/2009   DIVERTICULOSIS, COLON 12/14/2009   Joint pain 12/14/2009   BACK PAIN, CHRONIC 12/14/2009   FIBROMYALGIA 12/14/2009   OSTEOPOROSIS 12/14/2009   Sleep apnea 12/14/2009   History of colonic polyps 12/14/2009   GASTRIC POLYP, HX OF 12/14/2009   Diaphragmatic hernia 10/04/2009   Depression 09/29/2009   REFLUX ESOPHAGITIS 09/29/2009   GERD 09/29/2009   Constipation 09/29/2009   DYSPHAGIA 09/29/2009    PCP: Nohemi Batters, MD  REFERRING PROVIDER: Jennefer Moats, DPM  REFERRING DIAG: 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  She was unable to come to her appt on Tuesday d/t diverticulous exacerbation. She feels some improvement today.    EVAL: Patient presents to PT with history of slow healing 5th metatarsal fracture after a fall about 6 months ago. She states that she doesn't have a clear memory of the fall. "The doctors thought that I had a stroke. I went to lock the door and the next thing that  I remember is that I was on the floor and partially on the end table."  She states that she is still using the bone stimulator as instructed by referring provider. She is now able to wean from CAM boot. She states that she started walking to the bathroom without the boot at home. Today is her first day ambulating outside of the house without the CAM boot. She additionally reports hip and back pain over the past several months while ambulating with CAM boot and rollator.      PERTINENT HISTORY: Relevant PMHx includes anxiety, arthritis, bradycardia, carotid artery occlusion, depression, DM type II, fibromyalgia, glaucoma, hearing loss, HTN, CVA, carotid stenosis, scoliosis of lumbar spine   PAIN:  Are you having pain?  Yes: NPRS scale: 2-3/10 current, 10/10 (occasionally, nerve pain) Pain location: L foot (lateral aspect), hips, lower back   Pain description: dull ache  Aggravating factors: weight bearing Relieving factors: sitting, laying down  PRECAUTIONS: Fall  RED FLAGS: None   WEIGHT BEARING RESTRICTIONS: Yes WBAT, wean/discontinue CAM boot and transition to regular shoes  FALLS:  Has patient fallen in last 6 months? Yes. Number of falls 1, see MOI   LIVING ENVIRONMENT: Lives with: lives with their spouse Lives in: House/apartment Stairs: Yes: two  external steps with railing  Has following equipment at home: Single point cane and Walker - 4 wheeled for community ambulation   OCCUPATION: n/a   PLOF: Independent return to pain-free household mobility, ambulation, household chores   PATIENT GOALS: To return to ambulating with regular shoes and cane; address pain in foot, hip/back  NEXT MD VISIT: 03/30/2024  OBJECTIVE:  Note: Objective measures were completed at Evaluation unless otherwise noted.  DIAGNOSTIC FINDINGS:   Per referring provider office note (01/27/2024): "Xrays reviewed. Acute fracture noted to proximal fifth metatarsal noted through lateral half of bone just distal to tarsometatarsal joint. Fracture line appears to be nearly completely healed."   PATIENT SURVEYS:  LEFS 22/80  COGNITION: Overall cognitive status: Within functional limits for tasks assessed     SENSATION: Not tested  PALPATION: Moderate tenderness to palpation and proximal end of 5th metatarsal  LUMBAR ROM:   Active  A/PROM  eval  Flexion 80%  Extension 30%, back and hip pain reported  Right lateral flexion 75%  Left lateral flexion 75%  Right rotation 30%, stiffness  Left rotation 40%, stiffness   (Blank rows = not tested)   LOWER EXTREMITY ROM:  Active ROM Right eval Left eval Left 03/12/24  Hip flexion     Hip extension     Hip abduction     Hip adduction     Hip internal rotation     Hip external rotation     Knee flexion     Knee extension     Ankle dorsiflexion 10 -5 A: lacking 2 deg  P: neutral  Ankle plantarflexion 35 28   Ankle inversion 16 14   Ankle eversion 15 3    (Blank rows = not tested)  LOWER EXTREMITY MMT:   MMT Right eval Left eval  Hip flexion    Hip extension    Hip abduction    Hip adduction    Hip internal rotation    Hip external rotation    Knee flexion    Knee extension    Ankle dorsiflexion    Ankle plantarflexion    Ankle inversion    Ankle eversion     (Blank rows = not  tested)   FUNCTIONAL  TESTS:  5 times sit to stand: 16 seconds with UE, reports burning in foot.   03/12/24: 5xSTS 11sec w UE support, no foot pain  GAIT: Distance walked: 30 ft from lobby to evaluation area Assistive device utilized: Environmental consultant - 4 wheeled Level of assistance: Complete Independence Comments: antalgic gait pattern with decreased L stance time                                                                                                                                TREATMENT DATE:    [facility] Adult PT Treatment:                                                DATE:03/19/2024  Therapeutic Exercise: Nu step LE x L4 during subjective  Seated heel/toe raises 2x15 unweighted BIL cues for comfortable ROM Ankle dynadisc AP x 45 sec Ankle dynadisc ML x 45 sec   Therapeutic Activity: STS, 2 x 10  Fwd LLE weight shift onto 4 inch step w/ UE support, cues for more downward force rather than anterior  Self-Care: Monitoring vitals: 143/70 at start of session; 130/77 bpm   SpO2 96% throughout entire visit   [facility] Adult PT Treatment:                                                DATE: 03/12/24 Therapeutic Exercise: Nu step LE only L3 during subjective  Seated heel/toe raises 2x15 unweighted BIL cues for comfortable ROM HEP discussion/education   Therapeutic Activity: ROM measurements + education STS x8 from chair w/ UE support cues for pacing and posture 5xSTS + education Standing cone march 2x8 BIL w UE support Fwd LLE weight shift onto 4 inch step w/ UE support, cues for more downward force rather than anterior   [facility] Adult PT Treatment:                                                DATE: 03/05/2024  Therapeutic Exercise: nu-step (LE only) L2 43m while taking subjective and planning session with patient L Seated heel/toe raises 2x10 10lb LAQ 2.5lb ankle weight, 2 x 10 each  Dynadisc, sitting x 30 sec (ankle weight donned) ML Dynandisc sitting x 30 sec (ankle  weight donned) Seated Dynadisc circles x10 each way (ankle weight donned)  Seated marches 2 x 10  [place] Pick Ups x 3 minutes   Therapeutic Activity  STS airex 3 x 5  Seated Dynadisc circles x10 each way Towel scrunch limited ROM and alternating with  toe yoga (3 x 30 sec each)       PATIENT EDUCATION:  Education details: rationale for interventions, HEP  Person educated: Patient Education method: Explanation, Demonstration, Tactile cues, Verbal cues Education comprehension: verbalized understanding, returned demonstration, verbal cues required, tactile cues required, and needs further education     HOME EXERCISE PROGRAM:  Access Code: [email or web address] URL: [email or web address] Date: 03/19/2024 Prepared by: Arlester Bence  Program Notes Remember to look for a "Sock Aid" to help with putting on your compression socks.   Exercises - Ankle Inversion Eversion Towel Slide  - 1 x daily - 3 x weekly - 2 sets - 10 reps - Seated Toe Curl  - 1 x daily - 3 x weekly - 2 sets - 10 reps - Toe Spreading  - 1 x daily - 3 x weekly - 2 sets - 10 reps - Seated Hip Abduction with Resistance  - 1 x daily - 3 x weekly - 3 sets - 10 reps - Seated Hip Adduction Isometrics with Ball  - 1 x daily - 3 x weekly - 1 sets - 10 reps - 10 hold - Seated Heel Raise  - 1 x daily - 3 x weekly - 2 sets - 10 reps - Seated Ankle Eversion with Resistance  - 1 x daily - 3 x weekly - 2 sets - 10 reps - Seated Ankle Plantar Flexion with Resistance Loop  - 1 x daily - 3 x weekly - 2 sets - 10 reps - Seated Ankle Dorsiflexion with Resistance  - 1 x daily - 3 x weekly - 2 sets - 10 reps - Seated Ankle Inversion with Anchored Resistance  - 1 x daily - 3 x weekly - 2 sets - 10 reps  ASSESSMENT:  CLINICAL IMPRESSION:  03/19/2024: Alani had fair tolerance of today's treatment session, which focused on seated activities d/t low BP and GI symptoms this morning. Plan is to resume weight bearing activities at next visit. We  will continue to progress per POC as tolerated, in order to reach established rehab goals.     EVAL: Maiko is a 83 y.o. female who was seen today for physical therapy evaluation and treatment for Left Foot Pain with mobility deficits, related to slow healing (>6 month) 5th metatarsal fracture. She is demonstrating decreased L ankle AROM, decreased L ankle MMT, altered gait mechanics, and decreased STS time. She has developed BIL hip and low back pain that is likely related to extended time ambulating with CAM boot, which she is currently weaning from. She has related pain and difficulty with walking, ADLs/IADLs. She also remains at risk for falls, and requires patient education and PT intervention to decrease risk of falls. She requires skilled PT services at this time to address relevant deficits and improve overall function.     OBJECTIVE IMPAIRMENTS: Abnormal gait, decreased activity tolerance, decreased balance, decreased mobility, difficulty walking, decreased ROM, decreased strength, increased edema, and pain.   ACTIVITY LIMITATIONS: standing, squatting, and stairs  PARTICIPATION LIMITATIONS: meal prep, cleaning, laundry, shopping, and community activity  PERSONAL FACTORS: Age, Time since onset of injury/illness/exacerbation, and 3+ comorbidities: Relevant PMHx includes anxiety, arthritis, bradycardia, carotid artery occlusion, depression, DM type II, fibromyalgia, glaucoma, hearing loss, HTN, CVA, carotid stenosis, scoliosis of lumbar spine are also affecting patient's functional outcome.   REHAB POTENTIAL: Fair    CLINICAL DECISION MAKING: Evolving/moderate complexity  EVALUATION COMPLEXITY: Moderate   GOALS: Goals reviewed with patient? YES  SHORT TERM GOALS: Target date: 03/05/2024  Patient will be independent with initial home program at least 3 days/week.  Baseline: provided at eval 03/12/24: reports good HEP adherence Goal Status: met   2.  Patient will demonstrate complete 5  time Sit-to-Stand test in 14 seconds or less in order to demonstrate decreasing risk of falls.  Baseline: see objective measures 03/12/24: 11 sec Goal Status: met  3.  Patient will demonstrate improved L ankle DF AROM to at least neutral (0 degrees).  Baseline: lacking 5 degrees of DF 03/12/24: lacking 2 actively Goal Status: PROGRESSING   LONG TERM GOALS: Target date: 04/02/2024  Patient will report improved overall functional ability with LEFS score of 60/80 or greater.  Baseline: 22/80 Goal Status: INITIAL   2.  Patient will return to safe ambulation with [facility] for household and community ambulation.  Baseline: ambulating with antalgic gait with rollator  Goal status: INITIAL  3.  Patient will demonstrate at least 4+/5 MMT with LQ testing in L LE. Baseline: see objective measures  Goal status: INITIAL  4.  Patient will report ability to perform household chores and ADLs with no greater than 1/61 foot pain.  Baseline: up to 10/10 pain; moderate-to-severe difficulty perform activities around her home  Goal status: INITIAL     PLAN:  PT FREQUENCY: 1-2x/week  PT DURATION: 8 weeks  PLANNED INTERVENTIONS: 97164- PT Re-evaluation, 97750- Physical Performance Testing, 97110-Therapeutic exercises, 97530- Therapeutic activity, [id number]- Neuromuscular re-education, 97535- Self Care, 09604- Manual therapy, [phone]- Gait training, [phone]- Aquatic Therapy, [phone]- Vasopneumatic device, Patient/Family education, Balance training, Stair training, Taping, Joint mobilization, Cryotherapy, and Moist heat  PLAN FOR NEXT SESSION:  assess LBP and relevant obj measures; FGA or DGI when appropriate; progress towards established goals (to include ankle AROM, ankle strengthening, knee/hip strengthening, weight bearing, balance, gait training, progress to [facility] when appropriate, low-impact aerobic activity, manual therapy as indicated, other modalities as indicated).    Arlester Bence, PT, DPT  03/19/2024 1:08  PM

## 2024-03-20 ENCOUNTER — Telehealth: Payer: Self-pay | Admitting: Cardiology

## 2024-03-20 NOTE — Telephone Encounter (Signed)
 Pt says that she saw Francis Irons NP 07/2023 and she ordered an MRI of the brain..someone left her a message earlier today asking her to call back to schedule it but they did not leave a number according to her.   I am unclear if this is still needed since she saw Dr Emmette Harms after this.. most recent 01/10/24.. I will forward to Dr Emmette Harms to review.    Note from 07/2023:In the interim I will check an MRI of the brain to evaluate for cerebrovascular disease and possible TIAs associated with her syncopal episodes.

## 2024-03-20 NOTE — Telephone Encounter (Signed)
 Pt states she calling back to schedule a MRI appt. Please advise

## 2024-03-24 ENCOUNTER — Encounter: Payer: Self-pay | Admitting: Physical Therapy

## 2024-03-24 ENCOUNTER — Ambulatory Visit: Payer: Self-pay | Admitting: Physical Therapy

## 2024-03-24 DIAGNOSIS — M5416 Radiculopathy, lumbar region: Secondary | ICD-10-CM

## 2024-03-24 DIAGNOSIS — R262 Difficulty in walking, not elsewhere classified: Secondary | ICD-10-CM

## 2024-03-24 DIAGNOSIS — M25572 Pain in left ankle and joints of left foot: Secondary | ICD-10-CM

## 2024-03-24 NOTE — Therapy (Signed)
 Progress Note Reporting Period 4/24 to 6/10  See note below for Objective Data and Assessment of Progress/Goals.      Patient Name: MAKAYLAH ODDO MRN: [id number] DOB:November 24, 1940, 83 y.o., female Today's Date: 03/24/2024  END OF SESSION:  PT End of Session - 03/24/24 1148     Visit Number 10    Number of Visits 16    Date for PT Re-Evaluation 05/19/24    Authorization Type MCR    Progress Note Due on Visit 20    PT Start Time 1147    PT Stop Time 1226    PT Time Calculation (min) 39 min    Activity Tolerance Patient tolerated treatment well    Behavior During Therapy WFL for tasks assessed/performed                   Past Medical History:  Diagnosis Date   Allergy    Anal or rectal pain    sometimes   Anemia    hx of during pregnancy   Anxiety    Arthritis    Asthma    hx of   Bradycardia    " I KNOW I HAVE BRADYCARDIA ESPECIALLY WHEN I SLEEP"    Carotid artery occlusion    Cataract 2021   bilateral eyes   Chronic back pain    Degenerative joint disease    osteo   Depression    Diabetes mellitus without complication (HCC)    DM type II   Diverticulosis 2003   Dysrhythmia    hx of  due to eye drop and also low heart rate 40's per pt. Dr. Katheryne Pane follows   Elevated total protein    Esophageal dysmotility    Fibromyalgia    GERD (gastroesophageal reflux disease)    subsequent Nissen Fundoplication   Glaucoma    Hearing loss    Heart murmur    "was told she had a heart murmur"   Hemorrhoids    Hiatal hernia 11/08/2009   Hx of adenomatous colonic polyps 07/02/2002   Hypercalcemia    Hyperlipidemia    Hyperlipidemia    Hypertension    Implantable loop recorder present 11/28/2021   Nausea    Osteoporosis    PONV (postoperative nausea and vomiting)    Rectal bleeding    from hemorrhoids.     Sleep apnea    DOES USE CPAP    Thrombocytopenia (HCC)    Varicose veins of left lower extremity    Past Surgical History:  Procedure Laterality Date    CARDIAC CATHETERIZATION  03/14/1992   Normal cardiac cath. Normal LV function.   CARDIOVASCULAR STRESS TEST  01/22/2011   No scintigraphic evidence of inducible ischemia.   CAROTID DOPPLER  03/31/2007   Bilateral ICAs - no evidence of significant diameter reduction, dissectin, tortuosity, FMD, or any other vascular abnormality.   CATARACT EXTRACTION, BILATERAL     ESOPHAGEAL MANOMETRY N/A 11/14/2015   Procedure: ESOPHAGEAL MANOMETRY (EM);  Surgeon: Sergio Dandy, MD;  Location: [facility];  Service: Endoscopy;  Laterality: N/A;   ESOPHAGEAL MANOMETRY N/A 01/18/2021   Procedure: ESOPHAGEAL MANOMETRY (EM);  Surgeon: Sergio Dandy, MD;  Location: [facility];  Service: Endoscopy;  Laterality: N/A;   ESOPHAGOGASTRODUODENOSCOPY N/A 03/08/2021   Procedure: ESOPHAGOGASTRODUODENOSCOPY (EGD);  Surgeon: Hilarie Lovely, MD;  Location: [facility] OR;  Service: Thoracic;  Laterality: N/A;   EYE SURGERY     bilateral cataracts; bilateral stents   GASTRIC RESECTION  2009   GLAUCOMA SURGERY  JOINT REPLACEMENT     right knee Dr. France Ina 06-23-18   KNEE SURGERY Bilateral    NISSEN FUNDOPLICATION  2000   with subsequent takedown in 2009   TOTAL KNEE ARTHROPLASTY Left 06/10/2017   Procedure: LEFT  TOTAL KNEE ARTHROPLASTY;  Surgeon: Liliane Rei, MD;  Location: [facility];  Service: Orthopedics;  Laterality: Left;  Adductor Block   TOTAL KNEE ARTHROPLASTY Right 06/23/2018   Procedure: RIGHT TOTAL KNEE ARTHROPLASTY;  Surgeon: Liliane Rei, MD;  Location: [facility];  Service: Orthopedics;  Laterality: Right;   TRANSCAROTID ARTERY REVASCULARIZATION  Right 08/09/2023   Procedure: TRANSCAROTID ARTERY REVASCULARIZATION;  Surgeon: Carlene Che, MD;  Location: [facility] OR;  Service: Vascular;  Laterality: Right;   TRANSTHORACIC ECHOCARDIOGRAM  12/21/2010   EF 60%, moderate LVH,    TUBAL LIGATION     XI ROBOTIC ASSISTED HIATAL HERNIA REPAIR N/A 03/08/2021   Procedure: XI ROBOTIC ASSISTED LAPAROSCOPY WITH LYSIS OF  ADHESIONS;  Surgeon: Hilarie Lovely, MD;  Location: [facility] OR;  Service: Thoracic;  Laterality: N/A;   Patient Active Problem List   Diagnosis Date Noted   Closed nondisplaced fracture of fifth metatarsal bone of left foot 08/07/2023   Stroke (HCC) 08/07/2023   Carotid stenosis, asymptomatic, right 08/07/2023   CVA (cerebral vascular accident) (HCC) 08/06/2023   Syncope 10/22/2022   Acute renal failure superimposed on stage 3a chronic kidney disease (HCC) 05/22/2022   AKI (acute kidney injury) (HCC) 05/21/2022   Hiatal hernia 03/08/2021   Heartburn    Ineffective esophageal motility    Dyspnea on exertion 07/24/2020   Change in bowel habits 08/22/2018   Knee stiff 07/23/2018   Degeneration of lumbar intervertebral disc 02/04/2018   Scoliosis of lumbar spine 02/04/2018   Sinus bradycardia 07/27/2016   Cough    Essential hypertension 11/24/2014   Gonalgia 05/13/2013   OA (osteoarthritis) of knee 05/13/2013   Degenerative joint disease involving multiple joints 01/19/2013   Rheumatoid arthritis (HCC) 01/19/2013   Thyroid  nodule 06/11/2012   Elevated total protein    Cyst of thyroid  10/29/2011   Hemorrhoids, internal 05/28/2011   Acid reflux 02/14/2011   Avitaminosis D 02/13/2011   Glaucoma 11/28/2010   HLD (hyperlipidemia) 12/14/2009   Internal and external bleeding hemorrhoids 12/14/2009   DIVERTICULOSIS, COLON 12/14/2009   Joint pain 12/14/2009   BACK PAIN, CHRONIC 12/14/2009   FIBROMYALGIA 12/14/2009   OSTEOPOROSIS 12/14/2009   Sleep apnea 12/14/2009   History of colonic polyps 12/14/2009   GASTRIC POLYP, HX OF 12/14/2009   Diaphragmatic hernia 10/04/2009   Depression 09/29/2009   REFLUX ESOPHAGITIS 09/29/2009   GERD 09/29/2009   Constipation 09/29/2009   DYSPHAGIA 09/29/2009    PCP: Nohemi Batters, MD  REFERRING PROVIDER: Jennefer Moats, DPM  REFERRING DIAG: 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  She feels she fatigues quickly.   EVAL: Patient presents to PT with history of slow healing 5th metatarsal fracture after a fall about 6 months ago. She states that  she doesn't have a clear memory of the fall. "The doctors thought that I had a stroke. I went to lock the door and the next thing that I remember is that I was on the floor and partially on the end table."  She states that she is still using the bone stimulator as instructed by referring provider. She is now able to wean from CAM boot. She states that she started walking to the bathroom without the boot at home. Today is her first day ambulating outside of the house without the CAM boot. She additionally reports hip and back pain over the past several months while ambulating with CAM boot and rollator.      PERTINENT HISTORY: Relevant PMHx includes anxiety, arthritis, bradycardia, carotid artery occlusion, depression, DM type II, fibromyalgia, glaucoma, hearing loss, HTN, CVA, carotid stenosis, scoliosis of lumbar spine   PAIN:  Are you having pain?  Yes: NPRS scale: 2-3/10 current, 10/10 (occasionally, nerve pain) Pain location: L foot (lateral aspect), hips, lower back   Pain description: dull ache  Aggravating factors: weight bearing Relieving factors: sitting, laying down  PRECAUTIONS: Fall  RED FLAGS: None   WEIGHT BEARING RESTRICTIONS: Yes WBAT, wean/discontinue CAM boot and transition to regular shoes  FALLS:  Has patient fallen in  last 6 months? Yes. Number of falls 1, see MOI   LIVING ENVIRONMENT: Lives with: lives with their spouse Lives in: House/apartment Stairs: Yes: two external steps with railing  Has following equipment at home: Single point cane and Walker - 4 wheeled for community ambulation   OCCUPATION: n/a   PLOF: Independent return to pain-free household mobility, ambulation, household chores   PATIENT GOALS: To return to ambulating with regular shoes and cane; address pain in foot, hip/back  NEXT MD VISIT: 03/30/2024  OBJECTIVE:  Note: Objective measures were completed at Evaluation unless otherwise noted.  DIAGNOSTIC FINDINGS:   Per referring provider office note (01/27/2024): "Xrays reviewed. Acute fracture noted to proximal fifth metatarsal noted through lateral half of bone just distal to tarsometatarsal joint. Fracture line appears to be nearly completely healed."   PATIENT SURVEYS:  LEFS 22/80  COGNITION: Overall cognitive status: Within functional limits for tasks assessed     SENSATION: Not tested  PALPATION: Moderate tenderness to palpation and proximal end of 5th metatarsal  LUMBAR ROM:   Active  A/PROM  eval  Flexion 80%  Extension 30%, back and hip pain reported  Right lateral flexion 75%  Left lateral flexion 75%  Right rotation 30%, stiffness  Left rotation 40%, stiffness   (Blank rows = not tested)   LOWER EXTREMITY ROM:  Active ROM Right eval Left eval Left 03/12/24  Hip flexion     Hip extension     Hip abduction     Hip adduction     Hip internal rotation     Hip external rotation     Knee flexion     Knee extension     Ankle dorsiflexion 10 -5 A: lacking 2 deg  P: neutral  Ankle plantarflexion 35 28   Ankle inversion 16 14   Ankle eversion 15 3    (Blank rows = not tested)  LOWER EXTREMITY MMT:   MMT Right eval Left eval R/L 6/10  Hip flexion   4/3+  Hip extension     Hip abduction   3+/3+  Hip adduction     Hip internal rotation      Hip external rotation     Knee flexion  4*/4  Knee extension   4/4  Ankle dorsiflexion     Ankle plantarflexion     Ankle inversion     Ankle eversion      (Blank rows = not tested)   FUNCTIONAL TESTS:  5 times sit to stand: 16 seconds with UE, reports burning in foot.   03/12/24: 5xSTS 11sec w UE support, no foot pain  GAIT: Distance walked: 30 ft from lobby to evaluation area Assistive device utilized: Environmental consultant - 4 wheeled Level of assistance: Complete Independence Comments: antalgic gait pattern with decreased L stance time                                                                                                                                TREATMENT DATE:    [facility] Adult PT Treatment:                                                DATE:03/24/2024  Therapeutic Exercise: Nu step LE x L6 during subjective  L Seated heel/toe raises 2x10 2.5# LAQ 2.5lb ankle weight, 2 x 10 each  Dynadisc, sitting x 30 sec (ankle weight donned) ML Dynandisc sitting x 30 sec (ankle weight donned) Seated Dynadisc circles x10 each way (ankle weight donned)  Seated marches 2 x 10   Therapeutic Activity: Checking goals and reviewing with pt Monitoring vitals: BP 146/60 at start of session     [facility] Adult PT Treatment:                                                DATE: 03/12/24 Therapeutic Exercise: Nu step LE only L3 during subjective  Seated heel/toe raises 2x15 unweighted BIL cues for comfortable ROM HEP discussion/education   Therapeutic Activity: ROM measurements + education STS x8 from chair w/ UE support cues for pacing and posture 5xSTS + education Standing cone march 2x8 BIL w UE support Fwd LLE weight shift onto 4 inch step w/ UE support, cues for more downward force rather than anterior   [facility] Adult PT Treatment:                                                DATE: 03/05/2024  Therapeutic Exercise: nu-step (LE only) L2 56m while taking subjective and  planning session with patient L Seated heel/toe raises 2x10 10lb LAQ 2.5lb ankle weight, 2 x 10 each  Dynadisc, sitting x 30 sec (ankle weight donned) ML Dynandisc sitting x 30 sec (ankle weight donned) Seated Dynadisc circles x10 each way (  ankle weight donned)  Seated marches 2 x 10  [place] Pick Ups x 3 minutes   Therapeutic Activity  STS airex 3 x 5  Seated Dynadisc circles x10 each way Towel scrunch limited ROM and alternating with toe yoga (3 x 30 sec each)       PATIENT EDUCATION:  Education details: rationale for interventions, HEP  Person educated: Patient Education method: Explanation, Demonstration, Tactile cues, Verbal cues Education comprehension: verbalized understanding, returned demonstration, verbal cues required, tactile cues required, and needs further education     HOME EXERCISE PROGRAM:  Access Code: [email or web address] URL: [email or web address] Date: 03/19/2024 Prepared by: Arlester Bence  Program Notes Remember to look for a "Sock Aid" to help with putting on your compression socks.   Exercises - Ankle Inversion Eversion Towel Slide  - 1 x daily - 3 x weekly - 2 sets - 10 reps - Seated Toe Curl  - 1 x daily - 3 x weekly - 2 sets - 10 reps - Toe Spreading  - 1 x daily - 3 x weekly - 2 sets - 10 reps - Seated Hip Abduction with Resistance  - 1 x daily - 3 x weekly - 3 sets - 10 reps - Seated Hip Adduction Isometrics with Ball  - 1 x daily - 3 x weekly - 1 sets - 10 reps - 10 hold - Seated Heel Raise  - 1 x daily - 3 x weekly - 2 sets - 10 reps - Seated Ankle Eversion with Resistance  - 1 x daily - 3 x weekly - 2 sets - 10 reps - Seated Ankle Plantar Flexion with Resistance Loop  - 1 x daily - 3 x weekly - 2 sets - 10 reps - Seated Ankle Dorsiflexion with Resistance  - 1 x daily - 3 x weekly - 2 sets - 10 reps - Seated Ankle Inversion with Anchored Resistance  - 1 x daily - 3 x weekly - 2 sets - 10 reps  ASSESSMENT:  CLINICAL  IMPRESSION:  03/24/2024: Pt presents with continued fatigue today and requests that we hold off on standing exercises.  She reports generalized fatigue, particularly with standing exercises.  She attributes this to low BP but reading today is 145/60.  She is followed by cardiology and reports she has a visit scheduled soon.  Upon goal recheck she has made progress toward all goals, but has not yet met her LTG.  Her foot pain is significantly lower (~50%).  She is no longer demonstrating a significant antalgic gait but still requires a rollator for safety with ambulation.  Her LEFS score is somewhat improved.  MMT not taken on eval so unable to compare these today.  Overall, her L foot pain is significantly improved but she is limited by multi joint pain and fatigue in higher level activities.  Extending POC to allow continued work toward LTG and to improve safety and independence with functional mobility.    EVAL: Sherle is a 83 y.o. female who was seen today for physical therapy evaluation and treatment for Left Foot Pain with mobility deficits, related to slow healing (>6 month) 5th metatarsal fracture. She is demonstrating decreased L ankle AROM, decreased L ankle MMT, altered gait mechanics, and decreased STS time. She has developed BIL hip and low back pain that is likely related to extended time ambulating with CAM boot, which she is currently weaning from. She has related pain and difficulty with walking, ADLs/IADLs. She also remains at risk for falls, and  requires patient education and PT intervention to decrease risk of falls. She requires skilled PT services at this time to address relevant deficits and improve overall function.     OBJECTIVE IMPAIRMENTS: Abnormal gait, decreased activity tolerance, decreased balance, decreased mobility, difficulty walking, decreased ROM, decreased strength, increased edema, and pain.   ACTIVITY LIMITATIONS: standing, squatting, and stairs  PARTICIPATION  LIMITATIONS: meal prep, cleaning, laundry, shopping, and community activity  PERSONAL FACTORS: Age, Time since onset of injury/illness/exacerbation, and 3+ comorbidities: Relevant PMHx includes anxiety, arthritis, bradycardia, carotid artery occlusion, depression, DM type II, fibromyalgia, glaucoma, hearing loss, HTN, CVA, carotid stenosis, scoliosis of lumbar spine are also affecting patient's functional outcome.   REHAB POTENTIAL: Fair    CLINICAL DECISION MAKING: Evolving/moderate complexity  EVALUATION COMPLEXITY: Moderate   GOALS: Goals reviewed with patient? YES  SHORT TERM GOALS: Target date: 03/05/2024  Patient will be independent with initial home program at least 3 days/week.  Baseline: provided at eval 03/12/24: reports good HEP adherence Goal Status: met   2.  Patient will demonstrate complete 5 time Sit-to-Stand test in 14 seconds or less in order to demonstrate decreasing risk of falls.  Baseline: see objective measures 03/12/24: 11 sec Goal Status: met  3.  Patient will demonstrate improved L ankle DF AROM to at least neutral (0 degrees).  Baseline: lacking 5 degrees of DF 03/12/24: lacking 2 actively Goal Status: PROGRESSING   LONG TERM GOALS: Target date: 04/02/2024 extended to 05/19/2024   Patient will report improved overall functional ability with LEFS score of 60/80 or greater.  Baseline: 22/80 6/10: 27/80 Goal Status: Ongoing   2.  Patient will return to safe ambulation with [facility] for household and community ambulation.  Baseline: ambulating with antalgic gait with rollator  6/10: feeling safe with walker with reduced antalgic gait, not using cane Goal status: Ongoing  3.  Patient will demonstrate at least 4+/5 MMT with LQ testing in L LE. Baseline: see objective measures  6/10: ongoing Goal status: ongoing  4.  Patient will report ability to perform household chores and ADLs with no greater than 8/41 foot pain.  Baseline: up to 10/10 pain;  moderate-to-severe difficulty perform activities around her home  6/10: 5/10 Goal status: ongoing     PLAN:  PT FREQUENCY: 1-2x/week  PT DURATION: 8 weeks  PLANNED INTERVENTIONS: [id number]- PT Re-evaluation, 97750- Physical Performance Testing, 97110-Therapeutic exercises, 97530- Therapeutic activity, [id number]- Neuromuscular re-education, 97535- Self Care, [id number]- Manual therapy, [phone]- Gait training, [phone]- Aquatic Therapy, [phone]- Vasopneumatic device, Patient/Family education, Balance training, Stair training, Taping, Joint mobilization, Cryotherapy, and Moist heat  PLAN FOR NEXT SESSION:  assess LBP and relevant obj measures; FGA or DGI when appropriate; progress towards established goals (to include ankle AROM, ankle strengthening, knee/hip strengthening, weight bearing, balance, gait training, progress to [facility] when appropriate, low-impact aerobic activity, manual therapy as indicated, other modalities as indicated).    Karletta Millay E Traeson Dusza PT 03/24/2024 12:43 PM

## 2024-03-25 NOTE — Therapy (Signed)
 OUTPATIENT PHYSICAL THERAPY TREATMENT     Patient Name: [name] MRN: [id number] DOB:01-26-1941, 83 y.o., female Today's Date: 03/26/2024  END OF SESSION:  PT End of Session - 03/26/24 1045     Visit Number 11    Number of Visits 16    Date for PT Re-Evaluation 05/19/24    Authorization Type MCR    Progress Note Due on Visit 20    PT Start Time 1046    PT Stop Time 1127    PT Time Calculation (min) 41 min    Activity Tolerance Patient tolerated treatment well                 Past Medical History:  Diagnosis Date   Allergy    Anal or rectal pain    sometimes   Anemia    hx of during pregnancy   Anxiety    Arthritis    Asthma    hx of   Bradycardia     I KNOW I HAVE BRADYCARDIA ESPECIALLY WHEN I SLEEP    Carotid artery occlusion    Cataract 2021   bilateral eyes   Chronic back pain    Degenerative joint disease    osteo   Depression    Diabetes mellitus without complication (HCC)    DM type II   Diverticulosis 2003   Dysrhythmia    hx of  due to eye drop and also low heart rate 40's per pt. Dr. Katheryne Pane follows   Elevated total protein    Esophageal dysmotility    Fibromyalgia    GERD (gastroesophageal reflux disease)    subsequent Nissen Fundoplication   Glaucoma    Hearing loss    Heart murmur    was told she had a heart murmur   Hemorrhoids    Hiatal hernia 11/08/2009   Hx of adenomatous colonic polyps 07/02/2002   Hypercalcemia    Hyperlipidemia    Hyperlipidemia    Hypertension    Implantable loop recorder present 11/28/2021   Nausea    Osteoporosis    PONV (postoperative nausea and vomiting)    Rectal bleeding    from hemorrhoids.     Sleep apnea    DOES USE CPAP    Thrombocytopenia (HCC)    Varicose veins of left lower extremity    Past Surgical History:  Procedure Laterality Date   CARDIAC CATHETERIZATION  03/14/1992   Normal cardiac cath. Normal LV function.   CARDIOVASCULAR STRESS TEST  01/22/2011   No scintigraphic  evidence of inducible ischemia.   CAROTID DOPPLER  03/31/2007   Bilateral ICAs - no evidence of significant diameter reduction, dissectin, tortuosity, FMD, or any other vascular abnormality.   CATARACT EXTRACTION, BILATERAL     ESOPHAGEAL MANOMETRY N/A 11/14/2015   Procedure: ESOPHAGEAL MANOMETRY (EM);  Surgeon: Sergio Dandy, MD;  Location: [facility];  Service: Endoscopy;  Laterality: N/A;   ESOPHAGEAL MANOMETRY N/A 01/18/2021   Procedure: ESOPHAGEAL MANOMETRY (EM);  Surgeon: Sergio Dandy, MD;  Location: [facility];  Service: Endoscopy;  Laterality: N/A;   ESOPHAGOGASTRODUODENOSCOPY N/A 03/08/2021   Procedure: ESOPHAGOGASTRODUODENOSCOPY (EGD);  Surgeon: Hilarie Lovely, MD;  Location: [facility] OR;  Service: Thoracic;  Laterality: N/A;   EYE SURGERY     bilateral cataracts; bilateral stents   GASTRIC RESECTION  2009   GLAUCOMA SURGERY     JOINT REPLACEMENT     right knee Dr. France Ina 06-23-18   KNEE SURGERY Bilateral    NISSEN FUNDOPLICATION  2000  with subsequent takedown in 2009   TOTAL KNEE ARTHROPLASTY Left 06/10/2017   Procedure: LEFT  TOTAL KNEE ARTHROPLASTY;  Surgeon: Liliane Rei, MD;  Location: [facility];  Service: Orthopedics;  Laterality: Left;  Adductor Block   TOTAL KNEE ARTHROPLASTY Right 06/23/2018   Procedure: RIGHT TOTAL KNEE ARTHROPLASTY;  Surgeon: Liliane Rei, MD;  Location: [facility];  Service: Orthopedics;  Laterality: Right;   TRANSCAROTID ARTERY REVASCULARIZATION  Right 08/09/2023   Procedure: TRANSCAROTID ARTERY REVASCULARIZATION;  Surgeon: Carlene Che, MD;  Location: [facility] OR;  Service: Vascular;  Laterality: Right;   TRANSTHORACIC ECHOCARDIOGRAM  12/21/2010   EF 60%, moderate LVH,    TUBAL LIGATION     XI ROBOTIC ASSISTED HIATAL HERNIA REPAIR N/A 03/08/2021   Procedure: XI ROBOTIC ASSISTED LAPAROSCOPY WITH LYSIS OF ADHESIONS;  Surgeon: Hilarie Lovely, MD;  Location: [facility] OR;  Service: Thoracic;  Laterality: N/A;   Patient Active Problem List    Diagnosis Date Noted   Closed nondisplaced fracture of fifth metatarsal bone of left foot 08/07/2023   Stroke (HCC) 08/07/2023   Carotid stenosis, asymptomatic, right 08/07/2023   CVA (cerebral vascular accident) (HCC) 08/06/2023   Syncope 10/22/2022   Acute renal failure superimposed on stage 3a chronic kidney disease (HCC) 05/22/2022   AKI (acute kidney injury) (HCC) 05/21/2022   Hiatal hernia 03/08/2021   Heartburn    Ineffective esophageal motility    Dyspnea on exertion 07/24/2020   Change in bowel habits 08/22/2018   Knee stiff 07/23/2018   Degeneration of lumbar intervertebral disc 02/04/2018   Scoliosis of lumbar spine 02/04/2018   Sinus bradycardia 07/27/2016   Cough    Essential hypertension 11/24/2014   Gonalgia 05/13/2013   OA (osteoarthritis) of knee 05/13/2013   Degenerative joint disease involving multiple joints 01/19/2013   Rheumatoid arthritis (HCC) 01/19/2013   Thyroid  nodule 06/11/2012   Elevated total protein    Cyst of thyroid  10/29/2011   Hemorrhoids, internal 05/28/2011   Acid reflux 02/14/2011   Avitaminosis D 02/13/2011   Glaucoma 11/28/2010   HLD (hyperlipidemia) 12/14/2009   Internal and external bleeding hemorrhoids 12/14/2009   DIVERTICULOSIS, COLON 12/14/2009   Joint pain 12/14/2009   BACK PAIN, CHRONIC 12/14/2009   FIBROMYALGIA 12/14/2009   OSTEOPOROSIS 12/14/2009   Sleep apnea 12/14/2009   History of colonic polyps 12/14/2009   GASTRIC POLYP, HX OF 12/14/2009   Diaphragmatic hernia 10/04/2009   Depression 09/29/2009   REFLUX ESOPHAGITIS 09/29/2009   GERD 09/29/2009   Constipation 09/29/2009   DYSPHAGIA 09/29/2009    PCP: Nohemi Batters, MD  REFERRING PROVIDER: Jennefer Moats, DPM  REFERRING DIAG: 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 States she feels normal today but does feel likely lately she has been fatiguing more quickly. States she is seeing primary care soon, cardiology in July. States foot is feeling better than it was, less pain. Felt good after last session.      EVAL: Patient presents to PT with history of slow healing 5th metatarsal fracture after a fall about 6 months ago. She states that she doesn't have a clear memory of the fall. The doctors thought that I had  a stroke. I went to lock the door and the next thing that I remember is that I was on the floor and partially on the end table.  She states that she is still using the bone stimulator as instructed by referring provider. She is now able to wean from CAM boot. She states that she started walking to the bathroom without the boot at home. Today is her first day ambulating outside of the house without the CAM boot. She additionally reports hip and back pain over the past several months while ambulating with CAM boot and rollator.      PERTINENT HISTORY: Relevant PMHx includes anxiety, arthritis, bradycardia, carotid artery occlusion, depression, DM type II, fibromyalgia, glaucoma, hearing loss, HTN, CVA, carotid stenosis, scoliosis of lumbar spine   PAIN:  Are you having pain?  Yes: NPRS scale: 2-3/10 current, 10/10 (occasionally, nerve pain) Pain location: L foot (lateral aspect), hips, lower back   Pain description: dull ache  Aggravating factors: weight bearing Relieving factors: sitting, laying down  PRECAUTIONS: Fall  RED FLAGS: None   WEIGHT BEARING RESTRICTIONS: Yes WBAT, wean/discontinue CAM boot and transition to regular shoes  FALLS:  Has patient fallen in last 6 months? Yes. Number of falls 1, see MOI   LIVING  ENVIRONMENT: Lives with: lives with their spouse Lives in: House/apartment Stairs: Yes: two external steps with railing  Has following equipment at home: Single point cane and Walker - 4 wheeled for community ambulation   OCCUPATION: n/a   PLOF: Independent return to pain-free household mobility, ambulation, household chores   PATIENT GOALS: To return to ambulating with regular shoes and cane; address pain in foot, hip/back  NEXT MD VISIT: 03/30/2024  OBJECTIVE:  Note: Objective measures were completed at Evaluation unless otherwise noted.  DIAGNOSTIC FINDINGS:   Per referring provider office note (01/27/2024): Xrays reviewed. Acute fracture noted to proximal fifth metatarsal noted through lateral half of bone just distal to tarsometatarsal joint. Fracture line appears to be nearly completely healed.   PATIENT SURVEYS:  LEFS 22/80  COGNITION: Overall cognitive status: Within functional limits for tasks assessed     SENSATION: Not tested  PALPATION: Moderate tenderness to palpation and proximal end of 5th metatarsal  LUMBAR ROM:   Active  A/PROM  eval  Flexion 80%  Extension 30%, back and hip pain reported  Right lateral flexion 75%  Left lateral flexion 75%  Right rotation 30%, stiffness  Left rotation 40%, stiffness   (Blank rows = not tested)   LOWER EXTREMITY ROM:  Active ROM Right eval Left eval Left 03/12/24  Hip flexion     Hip extension     Hip abduction     Hip adduction     Hip internal rotation     Hip external rotation     Knee flexion     Knee extension     Ankle dorsiflexion 10 -5 A: lacking 2 deg  P: neutral  Ankle plantarflexion 35 28   Ankle inversion 16 14   Ankle eversion 15 3    (Blank rows = not tested)  LOWER EXTREMITY MMT:   MMT Right eval Left eval R/L 6/10  Hip flexion   4/3+  Hip extension     Hip abduction   3+/3+  Hip adduction     Hip internal rotation     Hip external rotation     Knee flexion   4*/4  Knee  extension   4/4  Ankle dorsiflexion  Ankle plantarflexion     Ankle inversion     Ankle eversion      (Blank rows = not tested)   FUNCTIONAL TESTS:  5 times sit to stand: 16 seconds with UE, reports burning in foot.   03/12/24: 5xSTS 11sec w UE support, no foot pain  GAIT: Distance walked: 30 ft from lobby to evaluation area Assistive device utilized: Walker - 4 wheeled Level of assistance: Complete Independence Comments: antalgic gait pattern with decreased L stance time                                                                                                                                TREATMENT DATE:    [facility] Adult PT Treatment:                                                DATE: 03/26/24 Vitals: HR 70s, SpO2 96-97% RA, BP 148/70 Therapeutic Exercise: Seated heel/toe raises 3x8 5#  LAQ 5# 2x8 Seated dynadisc DF/PF AAROM x10 Seated dynadisc ev/inv AAROM x10 cues for reduced knee compensations Blue band PF 2x12  Neuromuscular re-ed: Seated dynadisc circles CW/CCW x10 each cues for reduced knee compensations Heel prop ankle circles x10 CW/CCW cues for motor control AP rockerboard x15 cues for reduced toe compensations  Self Care: Vitals, education/discussion re: fatigue management and encouraged her to communicate with providers    [facility] Adult PT Treatment:                                                DATE:03/24/2024  Therapeutic Exercise: Nu step LE x L6 during subjective  L Seated heel/toe raises 2x10 2.5# LAQ 2.5lb ankle weight, 2 x 10 each  Dynadisc, sitting x 30 sec (ankle weight donned) ML Dynandisc sitting x 30 sec (ankle weight donned) Seated Dynadisc circles x10 each way (ankle weight donned)  Seated marches 2 x 10   Therapeutic Activity: Checking goals and reviewing with pt Monitoring vitals: BP 146/60 at start of session     [facility] Adult PT Treatment:                                                DATE: 03/12/24 Therapeutic  Exercise: Nu step LE only L3 during subjective  Seated heel/toe raises 2x15 unweighted BIL cues for comfortable ROM HEP discussion/education   Therapeutic Activity: ROM measurements + education STS x8 from chair w/ UE support cues for pacing and posture 5xSTS + education Standing cone march 2x8 BIL w UE support Fwd  LLE weight shift onto 4 inch step w/ UE support, cues for more downward force rather than anterior   [facility] Adult PT Treatment:                                                DATE: 03/05/2024  Therapeutic Exercise: nu-step (LE only) L2 15m while taking subjective and planning session with patient L Seated heel/toe raises 2x10 10lb LAQ 2.5lb ankle weight, 2 x 10 each  Dynadisc, sitting x 30 sec (ankle weight donned) ML Dynandisc sitting x 30 sec (ankle weight donned) Seated Dynadisc circles x10 each way (ankle weight donned)  Seated marches 2 x 10  [place] Pick Ups x 3 minutes   Therapeutic Activity  STS airex 3 x 5  Seated Dynadisc circles x10 each way Towel scrunch limited ROM and alternating with toe yoga (3 x 30 sec each)     PATIENT EDUCATION:  Education details: rationale for interventions, HEP  Person educated: Patient Education method: Explanation, Demonstration, Tactile cues, Verbal cues Education comprehension: verbalized understanding, returned demonstration, verbal cues required, tactile cues required, and needs further education     HOME EXERCISE PROGRAM:  Access Code: [email or web address] URL: [email or web address] Date: 03/19/2024 Prepared by: Arlester Bence  Program Notes Remember to look for a Sock Aid to help with putting on your compression socks.   Exercises - Ankle Inversion Eversion Towel Slide  - 1 x daily - 3 x weekly - 2 sets - 10 reps - Seated Toe Curl  - 1 x daily - 3 x weekly - 2 sets - 10 reps - Toe Spreading  - 1 x daily - 3 x weekly - 2 sets - 10 reps - Seated Hip Abduction with Resistance  - 1 x daily - 3 x weekly - 3  sets - 10 reps - Seated Hip Adduction Isometrics with Ball  - 1 x daily - 3 x weekly - 1 sets - 10 reps - 10 hold - Seated Heel Raise  - 1 x daily - 3 x weekly - 2 sets - 10 reps - Seated Ankle Eversion with Resistance  - 1 x daily - 3 x weekly - 2 sets - 10 reps - Seated Ankle Plantar Flexion with Resistance Loop  - 1 x daily - 3 x weekly - 2 sets - 10 reps - Seated Ankle Dorsiflexion with Resistance  - 1 x daily - 3 x weekly - 2 sets - 10 reps - Seated Ankle Inversion with Anchored Resistance  - 1 x daily - 3 x weekly - 2 sets - 10 reps  ASSESSMENT:  CLINICAL IMPRESSION:  03/26/2024: Pt arrives w/ min pain, primary issue is contiued fatigue. Vitals WNL, did encourage pt to communicate with providers as fatigue has been a continued issue for her. She requests continued seated exercise given fatigue which is respected, tolerates this well with progressions for volume/resistance where appropriate. Tendency for improved comfort/stiffness with repetition. No adverse events, denies increase in pain on departure. Recommend continuing along current POC in order to address relevant deficits and improve functional tolerance. Pt departs today's session in no acute distress, all voiced questions/concerns addressed appropriately from PT perspective.      EVAL: [name] is a 84 y.o. female who was seen today for physical therapy evaluation and treatment for Left Foot Pain with mobility deficits, related to slow healing (>6  month) 5th metatarsal fracture. She is demonstrating decreased L ankle AROM, decreased L ankle MMT, altered gait mechanics, and decreased STS time. She has developed BIL hip and low back pain that is likely related to extended time ambulating with CAM boot, which she is currently weaning from. She has related pain and difficulty with walking, ADLs/IADLs. She also remains at risk for falls, and requires patient education and PT intervention to decrease risk of falls. She requires skilled PT services  at this time to address relevant deficits and improve overall function.     OBJECTIVE IMPAIRMENTS: Abnormal gait, decreased activity tolerance, decreased balance, decreased mobility, difficulty walking, decreased ROM, decreased strength, increased edema, and pain.   ACTIVITY LIMITATIONS: standing, squatting, and stairs  PARTICIPATION LIMITATIONS: meal prep, cleaning, laundry, shopping, and community activity  PERSONAL FACTORS: Age, Time since onset of injury/illness/exacerbation, and 3+ comorbidities: Relevant PMHx includes anxiety, arthritis, bradycardia, carotid artery occlusion, depression, DM type II, fibromyalgia, glaucoma, hearing loss, HTN, CVA, carotid stenosis, scoliosis of lumbar spine are also affecting patient's functional outcome.   REHAB POTENTIAL: Fair    CLINICAL DECISION MAKING: Evolving/moderate complexity  EVALUATION COMPLEXITY: Moderate   GOALS: Goals reviewed with patient? YES  SHORT TERM GOALS: Target date: 03/05/2024  Patient will be independent with initial home program at least 3 days/week.  Baseline: provided at eval 03/12/24: reports good HEP adherence Goal Status: met   2.  Patient will demonstrate complete 5 time Sit-to-Stand test in 14 seconds or less in order to demonstrate decreasing risk of falls.  Baseline: see objective measures 03/12/24: 11 sec Goal Status: met  3.  Patient will demonstrate improved L ankle DF AROM to at least neutral (0 degrees).  Baseline: lacking 5 degrees of DF 03/12/24: lacking 2 actively Goal Status: PROGRESSING   LONG TERM GOALS: Target date: 04/02/2024 extended to 05/19/2024   Patient will report improved overall functional ability with LEFS score of 60/80 or greater.  Baseline: 22/80 6/10: 27/80 Goal Status: Ongoing   2.  Patient will return to safe ambulation with [facility] for household and community ambulation.  Baseline: ambulating with antalgic gait with rollator  6/10: feeling safe with walker with reduced antalgic  gait, not using cane Goal status: Ongoing  3.  Patient will demonstrate at least 4+/5 MMT with LQ testing in L LE. Baseline: see objective measures  6/10: ongoing Goal status: ongoing  4.  Patient will report ability to perform household chores and ADLs with no greater than 1/61 foot pain.  Baseline: up to 10/10 pain; moderate-to-severe difficulty perform activities around her home  6/10: 5/10 Goal status: ongoing     PLAN:  PT FREQUENCY: 1-2x/week  PT DURATION: 8 weeks  PLANNED INTERVENTIONS: 97164- PT Re-evaluation, 97750- Physical Performance Testing, 97110-Therapeutic exercises, 97530- Therapeutic activity, [id number]- Neuromuscular re-education, 97535- Self Care, 09604- Manual therapy, [id number]- Gait training, [phone]- Aquatic Therapy, [phone]- Vasopneumatic device, Patient/Family education, Balance training, Stair training, Taping, Joint mobilization, Cryotherapy, and Moist heat  PLAN FOR NEXT SESSION:  continue working on foot/ankle mobility and strengthening within pt tolerance.    Lovett Ruck PT, DPT 03/26/2024 1:58 PM

## 2024-03-26 ENCOUNTER — Encounter: Payer: Self-pay | Admitting: Physical Therapy

## 2024-03-26 ENCOUNTER — Ambulatory Visit: Payer: Self-pay | Admitting: Physical Therapy

## 2024-03-26 DIAGNOSIS — M25572 Pain in left ankle and joints of left foot: Secondary | ICD-10-CM

## 2024-03-26 DIAGNOSIS — R262 Difficulty in walking, not elsewhere classified: Secondary | ICD-10-CM | POA: Diagnosis not present

## 2024-03-26 DIAGNOSIS — M5416 Radiculopathy, lumbar region: Secondary | ICD-10-CM | POA: Diagnosis not present

## 2024-03-27 ENCOUNTER — Encounter (HOSPITAL_COMMUNITY): Payer: Self-pay

## 2024-03-27 ENCOUNTER — Ambulatory Visit (HOSPITAL_COMMUNITY)
Admission: RE | Admit: 2024-03-27 | Discharge: 2024-03-27 | Disposition: A | Discharge status: Home / Self Care | Source: Ambulatory Visit | Attending: Adult Health | Admitting: Adult Health

## 2024-03-27 DIAGNOSIS — R55 Syncope and collapse: Secondary | ICD-10-CM | POA: Insufficient documentation

## 2024-03-27 DIAGNOSIS — I6782 Cerebral ischemia: Secondary | ICD-10-CM | POA: Diagnosis not present

## 2024-03-27 DIAGNOSIS — I671 Cerebral aneurysm, nonruptured: Secondary | ICD-10-CM | POA: Diagnosis not present

## 2024-03-27 DIAGNOSIS — G459 Transient cerebral ischemic attack, unspecified: Secondary | ICD-10-CM | POA: Diagnosis not present

## 2024-03-27 MED ORDER — GADOBUTROL 1 MMOL/ML IV SOLN: 9.0000 mL | Freq: Once | INTRAVENOUS | Status: AC | PRN

## 2024-03-30 ENCOUNTER — Other Ambulatory Visit: Payer: Self-pay

## 2024-03-30 ENCOUNTER — Encounter: Payer: Self-pay | Admitting: Podiatry

## 2024-03-30 ENCOUNTER — Ambulatory Visit (INDEPENDENT_AMBULATORY_CARE_PROVIDER_SITE_OTHER): Admitting: Podiatry

## 2024-03-30 ENCOUNTER — Ambulatory Visit (INDEPENDENT_AMBULATORY_CARE_PROVIDER_SITE_OTHER)

## 2024-03-30 DIAGNOSIS — M79674 Pain in right toe(s): Secondary | ICD-10-CM | POA: Diagnosis not present

## 2024-03-30 DIAGNOSIS — M79675 Pain in left toe(s): Secondary | ICD-10-CM

## 2024-03-30 DIAGNOSIS — S92355K Nondisplaced fracture of fifth metatarsal bone, left foot, subsequent encounter for fracture with nonunion: Secondary | ICD-10-CM

## 2024-03-30 DIAGNOSIS — E1142 Type 2 diabetes mellitus with diabetic polyneuropathy: Secondary | ICD-10-CM | POA: Diagnosis not present

## 2024-03-30 DIAGNOSIS — B351 Tinea unguium: Secondary | ICD-10-CM

## 2024-03-30 NOTE — Progress Notes (Signed)
 Subjective:  Patient ID: [name], female    DOB: 05-05-1941,   MRN: [id number]  Chief Complaint  Patient presents with   Fracture    Rm 7 Fx follow up left foot 5th met/ 2/10 pain     83 y.o. female presents for follow-up of fifth metatarsal fracture. Relates she has been in PT and out of the boot and doing well  She has been using the bone stimulator consistently. Also concern of thickened elongated and painful nails that are difficult to trim. Requesting to have them trimmed today.  Relates burning and tingling in their feet. Patient is diabetic and last A1c was  Lab Results  Component Value Date   HGBA1C 6.8 (H) 08/07/2023   .     PCP:  Nohemi Batters, MD      PCP:  Nohemi Batters, MD    . Denies any other pedal complaints. Denies n/v/f/c.   Past Medical History:  Diagnosis Date   Allergy    Anal or rectal pain    sometimes   Anemia    hx of during pregnancy   Anxiety    Arthritis    Asthma    hx of   Bradycardia     I KNOW I HAVE BRADYCARDIA ESPECIALLY WHEN I SLEEP    Carotid artery occlusion    Cataract 2021   bilateral eyes   Chronic back pain    Degenerative joint disease    osteo   Depression    Diabetes mellitus without complication (HCC)    DM type II   Diverticulosis 2003   Dysrhythmia    hx of  due to eye drop and also low heart rate 40's per pt. Dr. Katheryne Pane follows   Elevated total protein    Esophageal dysmotility    Fibromyalgia    GERD (gastroesophageal reflux disease)    subsequent Nissen Fundoplication   Glaucoma    Hearing loss    Heart murmur    was told she had a heart murmur   Hemorrhoids    Hiatal hernia 11/08/2009   Hx of adenomatous colonic polyps 07/02/2002   Hypercalcemia    Hyperlipidemia    Hyperlipidemia    Hypertension    Implantable loop recorder present 11/28/2021   Nausea    Osteoporosis    PONV (postoperative nausea and vomiting)    Rectal bleeding    from hemorrhoids.     Sleep apnea    DOES USE  CPAP    Thrombocytopenia (HCC)    Varicose veins of left lower extremity     Objective:  Physical Exam: Vascular: DP/PT pulses 1/4 bilateral. CFT <3 seconds. Normal hair growth on digits. No edema.  Skin. No lacerations or abrasions bilateral feet. Nails 1-5 bilateral are thickened elongated and with subungual debris. Scaling and erythema noted to mostly left plantar feet.  Musculoskeletal: MMT 5/5 bilateral lower extremities in DF, PF, Inversion and Eversion. Deceased ROM in DF of ankle joint. Collapse of medial arch bilateral with eversion of calcaneus. Minimally tender to the latearl aspect of the foot around the fifth metatarsal diaphysis and proximally along the peroneal tendon insertion. No pain with ROM of the fifth digit.  Neurological: Sensation intact to light touch.   Assessment:   1. 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 Pain due to onychomycosis of toenails of both feet   3. Type 2 diabetes mellitus with diabetic polyneuropathy, without long-term  current use of insulin  [facility])       Plan:  Patient was evaluated and treated and all questions answered. -Answered all patient questions -Xrays reviewed. Acute fracture noted to proximal fifth metatarsal noted through lateral half of bone just distal to tarsometatarsal joint. Fracture line appears to be nearly completely healed.  -Discussed treatement options for fifth metatarsal fracture non union; risks, alternatives, and benefits explained. -Discontinued CAM boot and  in regular shoes.  -Continue PT.  -Discussed and educated patient on diabetic foot care, especially with  regards to the vascular, neurological and musculoskeletal systems.  -Stressed the importance of good glycemic control and the detriment of not  controlling glucose levels in relation to the foot. -Discussed supportive shoes at all times and checking feet regularly.  -Mechanically debrided  all nails 1-5 bilateral using sterile nail nipper and filed with dremel without incident  -Answered all patient questions -Patient to return  in 3 months for at risk foot care -Patient advised to call the office if any problems or questions arise in the meantime.   Jennefer Moats, DPM

## 2024-03-31 ENCOUNTER — Ambulatory Visit: Payer: Self-pay | Admitting: *Deleted

## 2024-03-31 ENCOUNTER — Ambulatory Visit: Admitting: Podiatry

## 2024-03-31 DIAGNOSIS — H43393 Other vitreous opacities, bilateral: Secondary | ICD-10-CM | POA: Diagnosis not present

## 2024-03-31 DIAGNOSIS — E113213 Type 2 diabetes mellitus with mild nonproliferative diabetic retinopathy with macular edema, bilateral: Secondary | ICD-10-CM | POA: Diagnosis not present

## 2024-03-31 DIAGNOSIS — H43823 Vitreomacular adhesion, bilateral: Secondary | ICD-10-CM | POA: Diagnosis not present

## 2024-03-31 DIAGNOSIS — H401133 Primary open-angle glaucoma, bilateral, severe stage: Secondary | ICD-10-CM | POA: Diagnosis not present

## 2024-04-01 ENCOUNTER — Ambulatory Visit: Payer: Self-pay

## 2024-04-01 DIAGNOSIS — M25572 Pain in left ankle and joints of left foot: Secondary | ICD-10-CM | POA: Diagnosis not present

## 2024-04-01 DIAGNOSIS — R262 Difficulty in walking, not elsewhere classified: Secondary | ICD-10-CM | POA: Diagnosis not present

## 2024-04-01 DIAGNOSIS — M5416 Radiculopathy, lumbar region: Secondary | ICD-10-CM

## 2024-04-01 NOTE — Therapy (Signed)
 OUTPATIENT PHYSICAL THERAPY TREATMENT     Patient Name: [name] MRN: [id number] DOB:10/30/1940, 83 y.o., female Today's Date: 04/01/2024  END OF SESSION:  PT End of Session - 04/01/24 1140     Visit Number 12    Number of Visits 16    Date for PT Re-Evaluation 05/19/24    Authorization Type MCR    Progress Note Due on Visit 20    PT Start Time 1132    PT Stop Time 1210    PT Time Calculation (min) 38 min    Activity Tolerance Patient tolerated treatment well    Behavior During Therapy WFL for tasks assessed/performed            Past Medical History:  Diagnosis Date   Allergy    Anal or rectal pain    sometimes   Anemia    hx of during pregnancy   Anxiety    Arthritis    Asthma    hx of   Bradycardia     I KNOW I HAVE BRADYCARDIA ESPECIALLY WHEN I SLEEP    Carotid artery occlusion    Cataract 2021   bilateral eyes   Chronic back pain    Degenerative joint disease    osteo   Depression    Diabetes mellitus without complication (HCC)    DM type II   Diverticulosis 2003   Dysrhythmia    hx of  due to eye drop and also low heart rate 40's per pt. Dr. Katheryne Pane follows   Elevated total protein    Esophageal dysmotility    Fibromyalgia    GERD (gastroesophageal reflux disease)    subsequent Nissen Fundoplication   Glaucoma    Hearing loss    Heart murmur    was told she had a heart murmur   Hemorrhoids    Hiatal hernia 11/08/2009   Hx of adenomatous colonic polyps 07/02/2002   Hypercalcemia    Hyperlipidemia    Hyperlipidemia    Hypertension    Implantable loop recorder present 11/28/2021   Nausea    Osteoporosis    PONV (postoperative nausea and vomiting)    Rectal bleeding    from hemorrhoids.     Sleep apnea    DOES USE CPAP    Thrombocytopenia (HCC)    Varicose veins of left lower extremity    Past Surgical History:  Procedure Laterality Date   CARDIAC CATHETERIZATION  03/14/1992   Normal cardiac cath. Normal LV function.    CARDIOVASCULAR STRESS TEST  01/22/2011   No scintigraphic evidence of inducible ischemia.   CAROTID DOPPLER  03/31/2007   Bilateral ICAs - no evidence of significant diameter reduction, dissectin, tortuosity, FMD, or any other vascular abnormality.   CATARACT EXTRACTION, BILATERAL     ESOPHAGEAL MANOMETRY N/A 11/14/2015   Procedure: ESOPHAGEAL MANOMETRY (EM);  Surgeon: Sergio Dandy, MD;  Location: [facility];  Service: Endoscopy;  Laterality: N/A;   ESOPHAGEAL MANOMETRY N/A 01/18/2021   Procedure: ESOPHAGEAL MANOMETRY (EM);  Surgeon: Sergio Dandy, MD;  Location: [facility];  Service: Endoscopy;  Laterality: N/A;   ESOPHAGOGASTRODUODENOSCOPY N/A 03/08/2021   Procedure: ESOPHAGOGASTRODUODENOSCOPY (EGD);  Surgeon: Hilarie Lovely, MD;  Location: [facility] OR;  Service: Thoracic;  Laterality: N/A;   EYE SURGERY     bilateral cataracts; bilateral stents   GASTRIC RESECTION  2009   GLAUCOMA SURGERY     JOINT REPLACEMENT     right knee Dr. France Ina 06-23-18   KNEE SURGERY Bilateral  NISSEN FUNDOPLICATION  2000   with subsequent takedown in 2009   TOTAL KNEE ARTHROPLASTY Left 06/10/2017   Procedure: LEFT  TOTAL KNEE ARTHROPLASTY;  Surgeon: Liliane Rei, MD;  Location: [facility];  Service: Orthopedics;  Laterality: Left;  Adductor Block   TOTAL KNEE ARTHROPLASTY Right 06/23/2018   Procedure: RIGHT TOTAL KNEE ARTHROPLASTY;  Surgeon: Liliane Rei, MD;  Location: [facility];  Service: Orthopedics;  Laterality: Right;   TRANSCAROTID ARTERY REVASCULARIZATION  Right 08/09/2023   Procedure: TRANSCAROTID ARTERY REVASCULARIZATION;  Surgeon: Carlene Che, MD;  Location: [facility] OR;  Service: Vascular;  Laterality: Right;   TRANSTHORACIC ECHOCARDIOGRAM  12/21/2010   EF 60%, moderate LVH,    TUBAL LIGATION     XI ROBOTIC ASSISTED HIATAL HERNIA REPAIR N/A 03/08/2021   Procedure: XI ROBOTIC ASSISTED LAPAROSCOPY WITH LYSIS OF ADHESIONS;  Surgeon: Hilarie Lovely, MD;  Location: [facility] OR;  Service:  Thoracic;  Laterality: N/A;   Patient Active Problem List   Diagnosis Date Noted   Closed nondisplaced fracture of fifth metatarsal bone of left foot 08/07/2023   Stroke (HCC) 08/07/2023   Carotid stenosis, asymptomatic, right 08/07/2023   CVA (cerebral vascular accident) (HCC) 08/06/2023   Syncope 10/22/2022   Acute renal failure superimposed on stage 3a chronic kidney disease (HCC) 05/22/2022   AKI (acute kidney injury) (HCC) 05/21/2022   Hiatal hernia 03/08/2021   Heartburn    Ineffective esophageal motility    Dyspnea on exertion 07/24/2020   Change in bowel habits 08/22/2018   Knee stiff 07/23/2018   Degeneration of lumbar intervertebral disc 02/04/2018   Scoliosis of lumbar spine 02/04/2018   Sinus bradycardia 07/27/2016   Cough    Essential hypertension 11/24/2014   Gonalgia 05/13/2013   OA (osteoarthritis) of knee 05/13/2013   Degenerative joint disease involving multiple joints 01/19/2013   Rheumatoid arthritis (HCC) 01/19/2013   Thyroid  nodule 06/11/2012   Elevated total protein    Cyst of thyroid  10/29/2011   Hemorrhoids, internal 05/28/2011   Acid reflux 02/14/2011   Avitaminosis D 02/13/2011   Glaucoma 11/28/2010   HLD (hyperlipidemia) 12/14/2009   Internal and external bleeding hemorrhoids 12/14/2009   DIVERTICULOSIS, COLON 12/14/2009   Joint pain 12/14/2009   BACK PAIN, CHRONIC 12/14/2009   FIBROMYALGIA 12/14/2009   OSTEOPOROSIS 12/14/2009   Sleep apnea 12/14/2009   History of colonic polyps 12/14/2009   GASTRIC POLYP, HX OF 12/14/2009   Diaphragmatic hernia 10/04/2009   Depression 09/29/2009   REFLUX ESOPHAGITIS 09/29/2009   GERD 09/29/2009   Constipation 09/29/2009   DYSPHAGIA 09/29/2009    PCP: Nohemi Batters, MD  REFERRING PROVIDER: Jennefer Moats, DPM  REFERRING DIAG: 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 Alvah Auerbach on Monday who stated that her bone was healed.   EVAL: Patient presents to PT with history of slow healing 5th metatarsal fracture after a fall about 6 months ago. She states that she doesn't have a clear memory of the fall. The doctors thought that I had a stroke. I went to lock the door and the next thing that I remember is that I was on the floor and partially on the end table.  She states that she is still using the bone stimulator as instructed by referring provider. She is now able to wean from CAM boot. She states that she started walking to the bathroom without the boot at home. Today is her first day ambulating outside of the house without the CAM boot. She additionally reports hip and back pain over the past several months while ambulating with CAM boot and rollator.      PERTINENT HISTORY: Relevant PMHx includes anxiety, arthritis, bradycardia, carotid artery occlusion, depression, DM type II, fibromyalgia, glaucoma, hearing loss, HTN, CVA, carotid stenosis, scoliosis of lumbar spine   PAIN:  Are you having pain?  Yes: NPRS scale: 2-3/10 current, 10/10 (occasionally, nerve pain) Pain location: L foot (lateral aspect), hips, lower back   Pain description: dull ache  Aggravating factors: weight bearing Relieving factors: sitting, laying down  PRECAUTIONS: Fall  RED FLAGS: None   WEIGHT BEARING RESTRICTIONS: Yes WBAT, wean/discontinue CAM boot and transition to regular shoes  FALLS:  Has patient fallen in last 6 months? Yes. Number of falls 1, see MOI   LIVING ENVIRONMENT: Lives with: lives with their spouse Lives in: House/apartment Stairs: Yes: two external steps with railing  Has following  equipment at home: Single point cane and Walker - 4 wheeled for community ambulation   OCCUPATION: n/a   PLOF: Independent return to pain-free household mobility, ambulation, household chores   PATIENT GOALS: To return to ambulating with regular shoes and cane; address pain in foot, hip/back  NEXT MD VISIT: 03/30/2024  OBJECTIVE:  Note: Objective measures were completed at Evaluation unless otherwise noted.  DIAGNOSTIC FINDINGS:   Per referring provider office note (01/27/2024): Xrays reviewed. Acute fracture noted to proximal fifth metatarsal noted through lateral half of bone just distal to tarsometatarsal joint. Fracture line appears to be nearly completely healed.   PATIENT SURVEYS:  LEFS 22/80  COGNITION: Overall cognitive status: Within functional limits for tasks assessed     SENSATION: Not tested  PALPATION: Moderate tenderness to palpation and proximal end of 5th metatarsal  LUMBAR ROM:   Active  A/PROM  eval  Flexion 80%  Extension 30%, back and hip pain reported  Right lateral flexion 75%  Left lateral flexion 75%  Right rotation 30%, stiffness  Left rotation 40%, stiffness   (Blank rows = not tested)   LOWER EXTREMITY ROM:  Active ROM Right eval Left eval Left 03/12/24 Left 04/01/24  Hip flexion      Hip extension      Hip abduction      Hip adduction      Hip internal rotation      Hip external rotation      Knee flexion      Knee extension      Ankle dorsiflexion 10 -5 A: lacking 2 deg  P: neutral 5 deg   Ankle plantarflexion 35 28  30 deg  Ankle inversion 16 14    Ankle eversion 15 3     (Blank rows = not tested)  LOWER EXTREMITY MMT:   MMT Right eval Left eval R/L 6/10  Hip flexion   4/3+  Hip extension     Hip abduction   3+/3+  Hip adduction     Hip internal rotation     Hip external rotation     Knee flexion   4*/4  Knee extension   4/4  Ankle dorsiflexion     Ankle plantarflexion     Ankle inversion     Ankle  eversion       (Blank rows = not tested)   FUNCTIONAL TESTS:  5 times sit to stand: 16 seconds with UE, reports burning in foot.   03/12/24: 5xSTS 11sec w UE support, no foot pain  GAIT: Distance walked: 30 ft from lobby to evaluation area Assistive device utilized: Environmental consultant - 4 wheeled Level of assistance: Complete Independence Comments: antalgic gait pattern with decreased L stance time                                                                                                                                TREATMENT DATE:   [facility] Adult PT Treatment:                                                DATE: 04/01/2024  Therapeutic Exercise: Nu step LE x L6 during subjective  Seated heel/toe raises 2x 10 10#  LAQ 5# 2x8 Seated dynadisc DF/PF AAROM x10 Seated dynadisc ev/inv AAROM x10 cues for reduced knee compensations  Neuromuscular re-ed: Seated dynadisc circles CW/CCW x10 each cues for reduced knee compensations  Therapeutic Activity: BP 137/66 prior to standing activities at end of session Sit to stand 3 x 5  Toe taps to 4 step, 2 x 30 sec Required seated rest break following standing activities     [facility] Adult PT Treatment:                                                DATE: 03/26/24 Vitals: HR 70s, SpO2 96-97% RA, BP 148/70 Therapeutic Exercise: Seated heel/toe raises 3x8 5#  LAQ 5# 2x8 Seated dynadisc DF/PF AAROM x10 Seated dynadisc ev/inv AAROM x10 cues for reduced knee compensations Blue band PF 2x12  Neuromuscular re-ed: Seated dynadisc circles CW/CCW x10 each cues for reduced knee compensations Heel prop ankle circles x10 CW/CCW cues for motor control AP rockerboard x15 cues for reduced toe compensations  Self Care: Vitals, education/discussion re: fatigue management and encouraged her to communicate with providers    [facility] Adult PT Treatment:                                                DATE:03/24/2024  Therapeutic Exercise: Nu step LE x L6 during  subjective  L Seated heel/toe raises 2x10 2.5# LAQ 2.5lb ankle weight, 2 x 10 each  Dynadisc, sitting x 30 sec (ankle weight donned) ML Dynandisc sitting x 30 sec (ankle weight donned) Seated Dynadisc circles x10 each way (ankle weight donned)  Seated marches 2 x  10   Therapeutic Activity: Checking goals and reviewing with pt Monitoring vitals: BP 146/60 at start of session     [facility] Adult PT Treatment:                                                DATE: 03/12/24 Therapeutic Exercise: Nu step LE only L3 during subjective  Seated heel/toe raises 2x15 unweighted BIL cues for comfortable ROM HEP discussion/education   Therapeutic Activity: ROM measurements + education STS x8 from chair w/ UE support cues for pacing and posture 5xSTS + education Standing cone march 2x8 BIL w UE support Fwd LLE weight shift onto 4 inch step w/ UE support, cues for more downward force rather than anterior     PATIENT EDUCATION:  Education details: rationale for interventions, HEP  Person educated: Patient Education method: Explanation, Demonstration, Tactile cues, Verbal cues Education comprehension: verbalized understanding, returned demonstration, verbal cues required, tactile cues required, and needs further education     HOME EXERCISE PROGRAM:  Access Code: [email or web address] URL: [email or web address] Date: 03/19/2024 Prepared by: Arlester Bence  Program Notes Remember to look for a Sock Aid to help with putting on your compression socks.   Exercises - Ankle Inversion Eversion Towel Slide  - 1 x daily - 3 x weekly - 2 sets - 10 reps - Seated Toe Curl  - 1 x daily - 3 x weekly - 2 sets - 10 reps - Toe Spreading  - 1 x daily - 3 x weekly - 2 sets - 10 reps - Seated Hip Abduction with Resistance  - 1 x daily - 3 x weekly - 3 sets - 10 reps - Seated Hip Adduction Isometrics with Ball  - 1 x daily - 3 x weekly - 1 sets - 10 reps - 10 hold - Seated Heel Raise  - 1 x daily - 3 x  weekly - 2 sets - 10 reps - Seated Ankle Eversion with Resistance  - 1 x daily - 3 x weekly - 2 sets - 10 reps - Seated Ankle Plantar Flexion with Resistance Loop  - 1 x daily - 3 x weekly - 2 sets - 10 reps - Seated Ankle Dorsiflexion with Resistance  - 1 x daily - 3 x weekly - 2 sets - 10 reps - Seated Ankle Inversion with Anchored Resistance  - 1 x daily - 3 x weekly - 2 sets - 10 reps  ASSESSMENT:  CLINICAL IMPRESSION:  04/01/2024: Patient arriving to today's session with minimal pain and fatigue. States that her BP has been more stable lately at home. She was able to tolerate today's session well, including STS and toe taps to step at end of session. Patient will benefit from gradual progression of weight bearing activities at next visit.    EVAL: Betty is a 83 y.o. female who was seen today for physical therapy evaluation and treatment for Left Foot Pain with mobility deficits, related to slow healing (>6 month) 5th metatarsal fracture. She is demonstrating decreased L ankle AROM, decreased L ankle MMT, altered gait mechanics, and decreased STS time. She has developed BIL hip and low back pain that is likely related to extended time ambulating with CAM boot, which she is currently weaning from. She has related pain and difficulty with walking, ADLs/IADLs. She also remains at risk for falls, and requires patient  education and PT intervention to decrease risk of falls. She requires skilled PT services at this time to address relevant deficits and improve overall function.     OBJECTIVE IMPAIRMENTS: Abnormal gait, decreased activity tolerance, decreased balance, decreased mobility, difficulty walking, decreased ROM, decreased strength, increased edema, and pain.   ACTIVITY LIMITATIONS: standing, squatting, and stairs  PARTICIPATION LIMITATIONS: meal prep, cleaning, laundry, shopping, and community activity  PERSONAL FACTORS: Age, Time since onset of injury/illness/exacerbation, and 3+  comorbidities: Relevant PMHx includes anxiety, arthritis, bradycardia, carotid artery occlusion, depression, DM type II, fibromyalgia, glaucoma, hearing loss, HTN, CVA, carotid stenosis, scoliosis of lumbar spine are also affecting patient's functional outcome.   REHAB POTENTIAL: Fair    CLINICAL DECISION MAKING: Evolving/moderate complexity  EVALUATION COMPLEXITY: Moderate   GOALS: Goals reviewed with patient? YES  SHORT TERM GOALS: Target date: 03/05/2024  Patient will be independent with initial home program at least 3 days/week.  Baseline: provided at eval 03/12/24: reports good HEP adherence Goal Status: met   2.  Patient will demonstrate complete 5 time Sit-to-Stand test in 14 seconds or less in order to demonstrate decreasing risk of falls.  Baseline: see objective measures 03/12/24: 11 sec Goal Status: met  3.  Patient will demonstrate improved L ankle DF AROM to at least neutral (0 degrees).  Baseline: lacking 5 degrees of DF 03/12/24: lacking 2 actively Goal Status: PROGRESSING   LONG TERM GOALS: Target date: 04/02/2024 extended to 05/19/2024   Patient will report improved overall functional ability with LEFS score of 60/80 or greater.  Baseline: 22/80 6/10: 27/80 Goal Status: Ongoing   2.  Patient will return to safe ambulation with [facility] for household and community ambulation.  Baseline: ambulating with antalgic gait with rollator  6/10: feeling safe with walker with reduced antalgic gait, not using cane Goal status: Ongoing  3.  Patient will demonstrate at least 4+/5 MMT with LQ testing in L LE. Baseline: see objective measures  6/10: ongoing Goal status: ongoing  4.  Patient will report ability to perform household chores and ADLs with no greater than 1/61 foot pain.  Baseline: up to 10/10 pain; moderate-to-severe difficulty perform activities around her home  6/10: 5/10 Goal status: ongoing     PLAN:  PT FREQUENCY: 1-2x/week  PT DURATION: 8  weeks  PLANNED INTERVENTIONS: [id number]- PT Re-evaluation, 97750- Physical Performance Testing, 97110-Therapeutic exercises, 97530- Therapeutic activity, [id number]- Neuromuscular re-education, 97535- Self Care, [id number]- Manual therapy, [id number]- Gait training, [phone]- Aquatic Therapy, [phone]- Vasopneumatic device, Patient/Family education, Balance training, Stair training, Taping, Joint mobilization, Cryotherapy, and Moist heat  PLAN FOR NEXT SESSION:  continue working on foot/ankle mobility and strengthening within pt tolerance.    Arlester Bence, PT, DPT  04/01/2024 12:26 PM

## 2024-04-02 ENCOUNTER — Ambulatory Visit: Payer: Self-pay | Admitting: Cardiology

## 2024-04-02 ENCOUNTER — Ambulatory Visit

## 2024-04-02 DIAGNOSIS — R55 Syncope and collapse: Secondary | ICD-10-CM | POA: Diagnosis not present

## 2024-04-02 LAB — CUP PACEART REMOTE DEVICE CHECK
Date Time Interrogation Session: 20250618232506
Implantable Pulse Generator Implant Date: 20240222

## 2024-04-03 ENCOUNTER — Other Ambulatory Visit: Payer: Self-pay | Admitting: Nurse Practitioner

## 2024-04-03 ENCOUNTER — Encounter: Payer: Self-pay | Admitting: Physician Assistant

## 2024-04-03 ENCOUNTER — Ambulatory Visit: Admitting: Physician Assistant

## 2024-04-03 VITALS — BP 122/68 | HR 71 | Ht 70.0 in | Wt 215.0 lb

## 2024-04-03 DIAGNOSIS — K5909 Other constipation: Secondary | ICD-10-CM | POA: Diagnosis not present

## 2024-04-03 DIAGNOSIS — K219 Gastro-esophageal reflux disease without esophagitis: Secondary | ICD-10-CM

## 2024-04-03 DIAGNOSIS — R109 Unspecified abdominal pain: Secondary | ICD-10-CM

## 2024-04-03 DIAGNOSIS — K649 Unspecified hemorrhoids: Secondary | ICD-10-CM | POA: Diagnosis not present

## 2024-04-03 DIAGNOSIS — K5900 Constipation, unspecified: Secondary | ICD-10-CM

## 2024-04-03 DIAGNOSIS — K625 Hemorrhage of anus and rectum: Secondary | ICD-10-CM | POA: Diagnosis not present

## 2024-04-03 DIAGNOSIS — R1084 Generalized abdominal pain: Secondary | ICD-10-CM

## 2024-04-03 MED ORDER — LINACLOTIDE 290 MCG PO CAPS: 290.0000 ug | ORAL_CAPSULE | Freq: Every day | ORAL | 3 refills | Status: AC

## 2024-04-03 MED ORDER — DEXLANSOPRAZOLE 60 MG PO CPDR: 60.0000 mg | DELAYED_RELEASE_CAPSULE | Freq: Every day | ORAL | 3 refills | Status: AC

## 2024-04-03 MED ORDER — HYDROCORTISONE (PERIANAL) 2.5 % EX CREA: 1.0000 | TOPICAL_CREAM | Freq: Two times a day (BID) | CUTANEOUS | 1 refills | Status: DC

## 2024-04-03 MED ORDER — SUCRALFATE 1 G PO TABS: 1.0000 g | ORAL_TABLET | Freq: Two times a day (BID) | ORAL | 3 refills | Status: AC | PRN

## 2024-04-03 NOTE — Progress Notes (Signed)
 Chief Complaint: GERD and constipation  HPI:    [name] is an 83 year old African-American female with a past medical history as listed below include anxiety, depression, arthritis, diabetes type 2, GERD, recurrent hiatal hernia status post repair x 2, colon polyps and multiple others, known to Dr. Leonia Raman, who presents to clinic today for follow-up of her reflux and constipation.     Colonoscopy 09/2018 with one 5 mm tubular adenoma.  Multiple diverticula and internal hemorrhoids.    EGD 3/22 with a 5 cm hiatal hernia, prior wrap was not intact, severe esophageal and gastric ulcers, negative for H. pylori.  Status post attempted redo laparoscopic hiatal hernia repair 5/22.  Although not accomplished due to the amount adhesions.    EGD 5/22 in the OR which showed a hiatal hernia.  Esophagus is patulous without ulcers.    01/09/2023 patient seen in clinic for follow-up of reflux and rectal bleeding by Everett Hitt, NP.  At that time was having some increased reflux and intermittent bright red rectal bleeding.  She was on Dexilant  60 mg once daily and using Carafate  at bedtime for 2 weeks.  At that point continued on Dexilant  60 mg daily and recommended Carafate  1 g p.o. once or twice a day.  Recommended Desitin and Benefiber as well as MiraLAX  daily.    Today, patient explains about 3 months ago she was started on Ozempic and has had increasing issues with generalized abdominal pain and distention with eructations and constipation.  Tells me that if she does not take her 2 senna tablets every night she does not have a bowel movement at all and even when she does she still feels like it was incomplete.  She sees occasional bright red blood on the toilet paper and knows its from hemorrhoids.  Has used Hydrocortisone  in the past but not lately.    Her heartburn is still under control Dexilant  60 mg daily and Carafate  1 g at night.    Denies fever, chills, vomiting or symptoms that awaken her  from sleep.  Previous procedures: ECHO 11/17/2021: Left ventricular ejection fraction, by estimation, is 60 to 65%. The left ventricle has normal function. The left ventricle has no regional wall motion abnormalities. There is mild concentric left ventricular hypertrophy. Left ventricular diastolic parameters are consistent with Grade I diastolic dysfunction (impaired relaxation).   Intraoperative EGD 03/08/2021: Negative for any evidence of persistent esophageal ulcer.    EGD 01/10/2021: - Dilation in the lower third of the esophagus.  - Esophageal ulcers. Biopsied. - 5 cm hiatal hernia.  - Gastric ulcers. Biopsied.  - A Nissen fundoplication was found. The wrap appears not intact.  - Normal examined duodenum. - GASTRIC MUCOSA WITH SLIGHT CHRONIC INFLAMMATION. - NO INTESTINAL METAPLASIA, DYSPLASIA, OR CARCINOMA.   Esophageal Manometry 01/18/2021: Normal relaxation of the EG junction Manometric evidence of hiatal hernia Ineffective esophageal motility with weak esophageal peristalsis    Colonoscopy 09/30/2018: - One 5 mm polyp in the transverse colon, removed with a cold snare. Resected and retrieved.  - Moderate diverticulosis in the sigmoid colon, in the descending colon, in the transverse colon, in the ascending colon and in the cecum. Surgical [P], transverse, polyp - TUBULAR ADENOMA (ONE). - NO HIGH GRADE DYSPLASIA OR MALIGNANCY  Past Medical History:  Diagnosis Date   Allergy    Anal or rectal pain    sometimes   Anemia    hx of during pregnancy   Anxiety    Arthritis  Asthma    hx of   Bradycardia     I KNOW I HAVE BRADYCARDIA ESPECIALLY WHEN I SLEEP    Carotid artery occlusion    Cataract 2021   bilateral eyes   Chronic back pain    Degenerative joint disease    osteo   Depression    Diabetes mellitus without complication (HCC)    DM type II   Diverticulosis 2003   Dysrhythmia    hx of  due to eye drop and also low heart rate 40's per pt. Dr. Katheryne Pane  follows   Elevated total protein    Esophageal dysmotility    Fibromyalgia    GERD (gastroesophageal reflux disease)    subsequent Nissen Fundoplication   Glaucoma    Hearing loss    Heart murmur    was told she had a heart murmur   Hemorrhoids    Hiatal hernia 11/08/2009   Hx of adenomatous colonic polyps 07/02/2002   Hypercalcemia    Hyperlipidemia    Hyperlipidemia    Hypertension    Implantable loop recorder present 11/28/2021   Nausea    Osteoporosis    PONV (postoperative nausea and vomiting)    Rectal bleeding    from hemorrhoids.     Sleep apnea    DOES USE CPAP    Thrombocytopenia (HCC)    Varicose veins of left lower extremity     Past Surgical History:  Procedure Laterality Date   CARDIAC CATHETERIZATION  03/14/1992   Normal cardiac cath. Normal LV function.   CARDIOVASCULAR STRESS TEST  01/22/2011   No scintigraphic evidence of inducible ischemia.   CAROTID DOPPLER  03/31/2007   Bilateral ICAs - no evidence of significant diameter reduction, dissectin, tortuosity, FMD, or any other vascular abnormality.   CATARACT EXTRACTION, BILATERAL     ESOPHAGEAL MANOMETRY N/A 11/14/2015   Procedure: ESOPHAGEAL MANOMETRY (EM);  Surgeon: Sergio Dandy, MD;  Location: [facility];  Service: Endoscopy;  Laterality: N/A;   ESOPHAGEAL MANOMETRY N/A 01/18/2021   Procedure: ESOPHAGEAL MANOMETRY (EM);  Surgeon: Sergio Dandy, MD;  Location: [facility];  Service: Endoscopy;  Laterality: N/A;   ESOPHAGOGASTRODUODENOSCOPY N/A 03/08/2021   Procedure: ESOPHAGOGASTRODUODENOSCOPY (EGD);  Surgeon: Hilarie Lovely, MD;  Location: [facility] OR;  Service: Thoracic;  Laterality: N/A;   EYE SURGERY     bilateral cataracts; bilateral stents   GASTRIC RESECTION  2009   GLAUCOMA SURGERY     JOINT REPLACEMENT     right knee Dr. France Ina 06-23-18   KNEE SURGERY Bilateral    NISSEN FUNDOPLICATION  2000   with subsequent takedown in 2009   TOTAL KNEE ARTHROPLASTY Left 06/10/2017    Procedure: LEFT  TOTAL KNEE ARTHROPLASTY;  Surgeon: Liliane Rei, MD;  Location: [facility];  Service: Orthopedics;  Laterality: Left;  Adductor Block   TOTAL KNEE ARTHROPLASTY Right 06/23/2018   Procedure: RIGHT TOTAL KNEE ARTHROPLASTY;  Surgeon: Liliane Rei, MD;  Location: [facility];  Service: Orthopedics;  Laterality: Right;   TRANSCAROTID ARTERY REVASCULARIZATION  Right 08/09/2023   Procedure: TRANSCAROTID ARTERY REVASCULARIZATION;  Surgeon: Carlene Che, MD;  Location: [facility] OR;  Service: Vascular;  Laterality: Right;   TRANSTHORACIC ECHOCARDIOGRAM  12/21/2010   EF 60%, moderate LVH,    TUBAL LIGATION     XI ROBOTIC ASSISTED HIATAL HERNIA REPAIR N/A 03/08/2021   Procedure: XI ROBOTIC ASSISTED LAPAROSCOPY WITH LYSIS OF ADHESIONS;  Surgeon: Hilarie Lovely, MD;  Location: [facility] OR;  Service: Thoracic;  Laterality: N/A;  Current Outpatient Medications  Medication Sig Dispense Refill   acetaminophen  (TYLENOL ) 500 MG tablet Take 1,000 mg by mouth 2 (two) times daily as needed for headache (back and knee pain).     albuterol  (VENTOLIN  HFA) 108 (90 Base) MCG/ACT inhaler Inhale 1 puff into the lungs as needed for wheezing or shortness of breath.     amLODipine (NORVASC) 10 MG tablet Take 10 mg by mouth daily. Per note patient taking for 90 days.     ascorbic acid  (VITAMIN C ) 500 MG tablet Take 1 tablet (500 mg total) by mouth daily. 90 tablet 0   brimonidine  (ALPHAGAN ) 0.2 % ophthalmic solution Place 1 drop into both eyes 2 (two) times daily.     cetirizine (ZYRTEC) 10 MG tablet Take 10 mg by mouth at bedtime.     Cholecalciferol  (VITAMIN D3) 250 MCG (10000 UT) capsule Take 10,000 Units by mouth daily.     clopidogrel  (PLAVIX ) 75 MG tablet Take 1 tablet (75 mg total) by mouth daily. 30 tablet 1   dexlansoprazole  (DEXILANT ) 60 MG capsule Take 1 capsule (60 mg total) by mouth daily. Please keep your upcoming appointment for further refills. Thank you 30 capsule 0   Evolocumab  (REPATHA   SURECLICK) 140 MG/ML SOAJ Inject 140 mg into the skin every 14 (fourteen) days. 2 mL 5   ezetimibe  (ZETIA ) 10 MG tablet Take 1 tablet (10 mg total) by mouth daily. 30 tablet 0   hydrocortisone  (ANUSOL -HC) 25 MG suppository Place 1 suppository (25 mg total) rectally at bedtime. 5 suppository 1   indomethacin  (INDOCIN ) 50 MG capsule PLEASE SEE ATTACHED FOR DETAILED DIRECTIONS 90 capsule 0   ketoconazole  (NIZORAL ) 2 % cream Apply 1 Application topically daily. 60 g 2   latanoprost  (XALATAN ) 0.005 % ophthalmic solution Place 1 drop into the right eye at bedtime.     metFORMIN (GLUCOPHAGE) 500 MG tablet Take 500 mg by mouth every morning.     mirtazapine (REMERON) 7.5 MG tablet Take 7.5 mg by mouth at bedtime.     montelukast  (SINGULAIR ) 10 MG tablet Take 10 mg by mouth at bedtime.     nystatin ointment (MYCOSTATIN) Apply 1 application  topically See admin instructions. 1 application 1-2 times a day as needed for rash  0   OZEMPIC, 0.25 OR 0.5 MG/DOSE, 2 MG/3ML SOPN SMARTSIG:0.25 Milligram(s) SUB-Q Once a Week     Polyethyl Glycol-Propyl Glycol (SYSTANE OP) Place 1 drop into both eyes daily as needed (dry/irritated eyes).     polyethylene glycol powder (GLYCOLAX /MIRALAX ) 17 GM/SCOOP powder Take 17 g by mouth daily. 476 g 0   rosuvastatin  (CRESTOR ) 40 MG tablet Take 40 mg by mouth at bedtime.     senna-docusate (SENOKOT-S) 8.6-50 MG tablet Take 2 tablets by mouth 2 (two) times daily. 60 tablet 0   sertraline  (ZOLOFT ) 100 MG tablet Take 100 mg by mouth every morning.     sucralfate  (CARAFATE ) 1 g tablet Take 1 tablet (1 g total) by mouth 2 (two) times daily as needed. Do not take within 2 hours of any other medications. 60 tablet 3   telmisartan (MICARDIS) 80 MG tablet Take 80 mg by mouth every morning.     Zinc  50 MG TABS Take 220 mg by mouth daily.     zinc  sulfate 220 (50 Zn) MG capsule Take 1 capsule (220 mg total) by mouth daily. 90 capsule 0   No current facility-administered medications for this  visit.    Allergies as of 04/03/2024 - Review Complete  04/01/2024  Allergen Reaction Noted   Adalat [nifedipine] Other (See Comments) 09/17/2013   Hydrocodone  Itching and Nausea And Vomiting 09/18/2013   Oxycodone -acetaminophen  Itching 10/30/2023   Meperidine hcl Other (See Comments)    Naproxen Nausea And Vomiting     Family History  Problem Relation Age of Onset   Heart disease Mother    Hypertension Mother    Diabetes Mother    Diabetes Father    Kidney disease Father    Stroke Brother    Cancer Maternal Grandmother    Kidney disease Maternal Grandfather    Breast cancer Daughter    Stomach cancer Maternal Aunt    Uterine cancer Maternal Aunt    Leukemia Maternal Uncle    Prostate cancer Maternal Uncle    Colon cancer Neg Hx    Rectal cancer Neg Hx    Esophageal cancer Neg Hx     Social History   Socioeconomic History   Marital status: Married    Spouse name: Not on file   Number of children: 8   Years of education: Not on file   Highest education level: Not on file  Occupational History   Occupation: retired    Comment: retired Clinical biochemist.   Tobacco Use   Smoking status: Never   Smokeless tobacco: Never  Vaping Use   Vaping status: Never Used  Substance and Sexual Activity   Alcohol  use: No    Alcohol /week: 0.0 standard drinks of alcohol    Drug use: No   Sexual activity: Yes  Other Topics Concern   Not on file  Social History Narrative   Husband, Mervin Girgenti is Next of Kin. Cell # [phone]      Right handed    Wear glasses drinks    Social Drivers of Health   Financial Resource Strain: Not on file  Food Insecurity: No Food Insecurity (08/07/2023)   Hunger Vital Sign    Worried About Running Out of Food in the Last Year: Never true    Ran Out of Food in the Last Year: Never true  Transportation Needs: No Transportation Needs (08/07/2023)   PRAPARE - Administrator, Civil Service (Medical): No    Lack of Transportation  (Non-Medical): No  Physical Activity: Not on file  Stress: Not on file  Social Connections: Not on file  Intimate Partner Violence: Not At Risk (08/07/2023)   Humiliation, Afraid, Rape, and Kick questionnaire    Fear of Current or Ex-Partner: No    Emotionally Abused: No    Physically Abused: No    Sexually Abused: No    Review of Systems:    Constitutional: No weight loss, fever or chills Cardiovascular: No chest pain Respiratory: No SOB  Gastrointestinal: See HPI and otherwise negative   Physical Exam:  Vital signs: BP 122/68   Pulse 71   Ht 5' 10 (1.778 m)   Wt 215 lb (97.5 kg)   BMI 30.85 kg/m    Constitutional:   Pleasant overweight AA female appears to be in NAD, Well developed, Well nourished, alert and cooperative Respiratory: Respirations even and unlabored. Lungs clear to auscultation bilaterally.   No wheezes, crackles, or rhonchi.  Cardiovascular: Normal S1, S2. No MRG. Regular rate and rhythm. No peripheral edema, cyanosis or pallor.  Gastrointestinal:  Soft, nondistended, nontender. No rebound or guarding.  Increased bowel sounds all 4 quadrants. No appreciable masses or hepatomegaly. Rectal:  Declined Psychiatric: Oriented to person, place and time. Demonstrates good judgement and reason without  abnormal affect or behaviors.  RELEVANT LABS AND IMAGING: CBC    Component Value Date/Time   WBC 6.4 08/17/2023 2158   RBC 4.12 08/17/2023 2158   HGB 12.7 08/17/2023 2158   HGB 12.9 11/16/2021 0930   HGB 12.7 01/08/2017 1224   HCT 38.6 08/17/2023 2158   HCT 38.2 11/16/2021 0930   HCT 39.3 01/08/2017 1224   PLT 201 08/17/2023 2158   PLT 148 (L) 11/16/2021 0930   MCV 93.7 08/17/2023 2158   MCV 94 11/16/2021 0930   MCV 95.2 01/08/2017 1224   MCH 30.8 08/17/2023 2158   MCHC 32.9 08/17/2023 2158   RDW 12.8 08/17/2023 2158   RDW 13.1 11/16/2021 0930   RDW 12.9 01/08/2017 1224   LYMPHSABS 2.5 08/11/2023 0329   LYMPHSABS 2.3 01/08/2017 1224   MONOABS 0.5  08/11/2023 0329   MONOABS 0.8 01/08/2017 1224   EOSABS 0.4 08/11/2023 0329   EOSABS 0.2 01/08/2017 1224   BASOSABS 0.0 08/11/2023 0329   BASOSABS 0.0 01/08/2017 1224    CMP     Component Value Date/Time   NA 137 08/17/2023 2158   NA 141 08/05/2023 1157   NA 141 01/08/2017 1224   K 4.2 08/17/2023 2158   K 5.3 (H) 01/08/2017 1224   CL 104 08/17/2023 2158   CL 105 10/24/2012 1407   CO2 24 08/17/2023 2158   CO2 32 (H) 01/08/2017 1224   GLUCOSE 136 (H) 08/17/2023 2158   GLUCOSE 81 01/08/2017 1224   GLUCOSE 87 10/24/2012 1407   BUN 16 08/17/2023 2158   BUN 18 08/05/2023 1157   BUN 16.6 01/08/2017 1224   CREATININE 1.07 (H) 08/17/2023 2158   CREATININE 1.0 01/08/2017 1224   CALCIUM  9.6 08/17/2023 2158   CALCIUM  10.4 01/08/2017 1224   PROT 6.7 08/06/2023 1726   PROT 7.0 10/11/2022 0958   PROT 7.3 01/08/2017 1224   ALBUMIN  3.6 08/06/2023 1726   ALBUMIN  4.4 10/11/2022 0958   ALBUMIN  3.8 01/08/2017 1224   AST 33 08/06/2023 1726   AST 22 01/08/2017 1224   ALT 20 08/06/2023 1726   ALT 18 01/08/2017 1224   ALKPHOS 69 08/06/2023 1726   ALKPHOS 71 01/08/2017 1224   BILITOT 0.7 08/06/2023 1726   BILITOT 0.4 10/11/2022 0958   BILITOT 0.46 01/08/2017 1224   GFRNONAA 53 (L) 08/17/2023 2158   GFRAA >60 07/04/2018 1357    Assessment: 1.  Constipation: Worsened over the past 3 months with addition of Ozempic, likely medication side effect on top of her chronic constipation issues 2.  Abdominal discomfort: With above 3.  GERD: Controlled on Dexilant  60 mg daily and Carafate  1 g at night 4.  Hemorrhoids with bleeding: Patient feels them on the outside and has also had issues with internal hemorrhoids in the past help by hydrocortisone  before  Plan: 1.  Prescribe Linzess 290 mg daily 30 minutes before breakfast #90 with 3 refills 2.  Discussed with patient she would likely be able to discontinue senna when starting Linzess 3.  Recommend the patient have a discussion about Ozempic with  her PCP given her chronic issues of constipation and gastritis, this may not be the best medication for her. 4.  Refill Dexilant  60 mg daily #90 with 3 refills 5.  Refill Carafate  1 g nightly #90 with 3 refills 6.  Prescribed Hydrocortisone  2.5% ointment to be applied outside to external hemorrhoids twice daily and also to a Preparation H suppository and inserted twice daily for the next week.  Refill x  1.  She can use as needed after that point. 7.  Patient to follow in clinic with us  as needed.  Reginal Capra, PA-C [facility] 04/03/2024, 10:38 AM  Cc: Cheryl Batters, MD

## 2024-04-03 NOTE — Patient Instructions (Addendum)
 We have sent the following medications to your pharmacy for you to pick up at your convenience: Hydrocortisone  Ointment 2.5%, apply twice a day to outer area and to over the counter PreparationH suppository twice a day for 7 days.  Linzess 290 mcg once daily.   Refills were sent to your pharmacy for : Dexilant  60mg  and Carafate  1G.  Discuss Ozempic with your primary care doctor.   Follow up as needed.  Thank you for trusting me with your gastrointestinal care!  Reginal Capra, PA-C  _______________________________________________________  If your blood pressure at your visit was 140/90 or greater, please contact your primary care physician to follow up on this.  _______________________________________________________  If you are age 46 or older, your body mass index should be between 23-30. Your Body mass index is 30.85 kg/m. If this is out of the aforementioned range listed, please consider follow up with your Primary Care Provider.  If you are age 33 or younger, your body mass index should be between 19-25. Your Body mass index is 30.85 kg/m. If this is out of the aformentioned range listed, please consider follow up with your Primary Care Provider.   ________________________________________________________  The [facility] providers would like to encourage you to use MYCHART to communicate with providers for non-urgent requests or questions.  Due to long hold times on the telephone, sending your provider a message by [facility] may be a faster and more efficient way to get a response.  Please allow 48 business hours for a response.  Please remember that this is for non-urgent requests.  _______________________________________________________

## 2024-04-06 DIAGNOSIS — R5383 Other fatigue: Secondary | ICD-10-CM | POA: Diagnosis not present

## 2024-04-06 DIAGNOSIS — N1831 Chronic kidney disease, stage 3a: Secondary | ICD-10-CM | POA: Diagnosis not present

## 2024-04-06 DIAGNOSIS — E7800 Pure hypercholesterolemia, unspecified: Secondary | ICD-10-CM | POA: Diagnosis not present

## 2024-04-06 DIAGNOSIS — E559 Vitamin D deficiency, unspecified: Secondary | ICD-10-CM | POA: Diagnosis not present

## 2024-04-06 DIAGNOSIS — R0609 Other forms of dyspnea: Secondary | ICD-10-CM | POA: Diagnosis not present

## 2024-04-06 DIAGNOSIS — E1165 Type 2 diabetes mellitus with hyperglycemia: Secondary | ICD-10-CM | POA: Diagnosis not present

## 2024-04-06 NOTE — Therapy (Signed)
 OUTPATIENT PHYSICAL THERAPY TREATMENT     Patient Name: [name] MRN: [id number] DOB:1941/03/21, 83 y.o., female Today's Date: 04/07/2024  END OF SESSION:  PT End of Session - 04/07/24 1045     Visit Number 13    Number of Visits 16    Date for PT Re-Evaluation 05/19/24    Authorization Type MCR    Progress Note Due on Visit 20    PT Start Time 1045    PT Stop Time 1124    PT Time Calculation (min) 39 min    Activity Tolerance Patient tolerated treatment well             Past Medical History:  Diagnosis Date   Allergy    Anal or rectal pain    sometimes   Anemia    hx of during pregnancy   Anxiety    Arthritis    Asthma    hx of   Bradycardia     I KNOW I HAVE BRADYCARDIA ESPECIALLY WHEN I SLEEP    Carotid artery occlusion    Cataract 2021   bilateral eyes   Chronic back pain    Degenerative joint disease    osteo   Depression    Diabetes mellitus without complication (HCC)    DM type II   Diverticulosis 2003   Dysrhythmia    hx of  due to eye drop and also low heart rate 40's per pt. Dr. Court follows   Elevated total protein    Esophageal dysmotility    Fibromyalgia    GERD (gastroesophageal reflux disease)    subsequent Nissen Fundoplication   Glaucoma    Hearing loss    Heart murmur    was told she had a heart murmur   Hemorrhoids    Hiatal hernia 11/08/2009   Hx of adenomatous colonic polyps 07/02/2002   Hypercalcemia    Hyperlipidemia    Hyperlipidemia    Hypertension    Implantable loop recorder present 11/28/2021   Nausea    Osteoporosis    PONV (postoperative nausea and vomiting)    Rectal bleeding    from hemorrhoids.     Sleep apnea    DOES USE CPAP    Stroke (HCC)    Thrombocytopenia (HCC)    Varicose veins of left lower extremity    Past Surgical History:  Procedure Laterality Date   CARDIAC CATHETERIZATION  03/14/1992   Normal cardiac cath. Normal LV function.   CARDIOVASCULAR STRESS TEST  01/22/2011   No  scintigraphic evidence of inducible ischemia.   CAROTID DOPPLER  03/31/2007   Bilateral ICAs - no evidence of significant diameter reduction, dissectin, tortuosity, FMD, or any other vascular abnormality.   CATARACT EXTRACTION, BILATERAL     ESOPHAGEAL MANOMETRY N/A 11/14/2015   Procedure: ESOPHAGEAL MANOMETRY (EM);  Surgeon: Gustav Shila GAILS, MD;  Location: [facility];  Service: Endoscopy;  Laterality: N/A;   ESOPHAGEAL MANOMETRY N/A 01/18/2021   Procedure: ESOPHAGEAL MANOMETRY (EM);  Surgeon: Shila Gustav GAILS, MD;  Location: [facility];  Service: Endoscopy;  Laterality: N/A;   ESOPHAGOGASTRODUODENOSCOPY N/A 03/08/2021   Procedure: ESOPHAGOGASTRODUODENOSCOPY (EGD);  Surgeon: Shyrl Linnie KIDD, MD;  Location: [facility] OR;  Service: Thoracic;  Laterality: N/A;   EYE SURGERY     bilateral cataracts; bilateral stents   FOOT BONE EXCISION     GASTRIC RESECTION  2009   GLAUCOMA SURGERY     JOINT REPLACEMENT     right knee Dr. Melodi 06-23-18   KNEE SURGERY  Bilateral    NISSEN FUNDOPLICATION  2000   with subsequent takedown in 2009   TOTAL KNEE ARTHROPLASTY Left 06/10/2017   Procedure: LEFT  TOTAL KNEE ARTHROPLASTY;  Surgeon: Melodi Lerner, MD;  Location: [facility];  Service: Orthopedics;  Laterality: Left;  Adductor Block   TOTAL KNEE ARTHROPLASTY Right 06/23/2018   Procedure: RIGHT TOTAL KNEE ARTHROPLASTY;  Surgeon: Melodi Lerner, MD;  Location: [facility];  Service: Orthopedics;  Laterality: Right;   TRANSCAROTID ARTERY REVASCULARIZATION  Right 08/09/2023   Procedure: TRANSCAROTID ARTERY REVASCULARIZATION;  Surgeon: Magda Debby SAILOR, MD;  Location: [facility] OR;  Service: Vascular;  Laterality: Right;   TRANSTHORACIC ECHOCARDIOGRAM  12/21/2010   EF 60%, moderate LVH,    TUBAL LIGATION     XI ROBOTIC ASSISTED HIATAL HERNIA REPAIR N/A 03/08/2021   Procedure: XI ROBOTIC ASSISTED LAPAROSCOPY WITH LYSIS OF ADHESIONS;  Surgeon: Shyrl Linnie KIDD, MD;  Location: [facility] OR;  Service: Thoracic;   Laterality: N/A;   Patient Active Problem List   Diagnosis Date Noted   Closed nondisplaced fracture of fifth metatarsal bone of left foot 08/07/2023   Stroke (HCC) 08/07/2023   Carotid stenosis, asymptomatic, right 08/07/2023   CVA (cerebral vascular accident) (HCC) 08/06/2023   Syncope 10/22/2022   Acute renal failure superimposed on stage 3a chronic kidney disease (HCC) 05/22/2022   AKI (acute kidney injury) (HCC) 05/21/2022   Hiatal hernia 03/08/2021   Heartburn    Ineffective esophageal motility    Dyspnea on exertion 07/24/2020   Change in bowel habits 08/22/2018   Knee stiff 07/23/2018   Degeneration of lumbar intervertebral disc 02/04/2018   Scoliosis of lumbar spine 02/04/2018   Sinus bradycardia 07/27/2016   Cough    Essential hypertension 11/24/2014   Gonalgia 05/13/2013   OA (osteoarthritis) of knee 05/13/2013   Degenerative joint disease involving multiple joints 01/19/2013   Rheumatoid arthritis (HCC) 01/19/2013   Thyroid  nodule 06/11/2012   Elevated total protein    Cyst of thyroid  10/29/2011   Hemorrhoids, internal 05/28/2011   Acid reflux 02/14/2011   Avitaminosis D 02/13/2011   Glaucoma 11/28/2010   HLD (hyperlipidemia) 12/14/2009   Internal and external bleeding hemorrhoids 12/14/2009   DIVERTICULOSIS, COLON 12/14/2009   Joint pain 12/14/2009   BACK PAIN, CHRONIC 12/14/2009   FIBROMYALGIA 12/14/2009   OSTEOPOROSIS 12/14/2009   Sleep apnea 12/14/2009   History of colonic polyps 12/14/2009   GASTRIC POLYP, HX OF 12/14/2009   Diaphragmatic hernia 10/04/2009   Depression 09/29/2009   REFLUX ESOPHAGITIS 09/29/2009   GERD 09/29/2009   Constipation 09/29/2009   DYSPHAGIA 09/29/2009    PCP: Roanna Ezekiel [name], MD  REFERRING PROVIDER: Joya Stabs, DPM  REFERRING DIAG: 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 States her foot is doing better, has been trying to do more around the house. Feels her energy is improving as well. No pain at present.   EVAL: Patient presents to PT with history of slow healing 5th metatarsal fracture after a fall about 6 months ago. She states that she doesn't have a clear memory of the fall. The doctors thought that I had a stroke. I went to lock the door and  the next thing that I remember is that I was on the floor and partially on the end table.  She states that she is still using the bone stimulator as instructed by referring provider. She is now able to wean from CAM boot. She states that she started walking to the bathroom without the boot at home. Today is her first day ambulating outside of the house without the CAM boot. She additionally reports hip and back pain over the past several months while ambulating with CAM boot and rollator.      PERTINENT HISTORY: Relevant PMHx includes anxiety, arthritis, bradycardia, carotid artery occlusion, depression, DM type II, fibromyalgia, glaucoma, hearing loss, HTN, CVA, carotid stenosis, scoliosis of lumbar spine   PAIN:  Are you having pain?  Yes: NPRS scale: 2-3/10 current, 10/10 (occasionally, nerve pain) Pain location: L foot (lateral aspect), hips, lower back   Pain description: dull ache  Aggravating factors: weight bearing Relieving factors: sitting, laying down  PRECAUTIONS: Fall  RED FLAGS: None   WEIGHT BEARING RESTRICTIONS: Yes WBAT, wean/discontinue CAM boot and transition to regular shoes  FALLS:  Has patient fallen in last 6 months? Yes. Number of falls 1, see MOI   LIVING ENVIRONMENT: Lives with: lives with their spouse Lives in:  House/apartment Stairs: Yes: two external steps with railing  Has following equipment at home: Single point cane and Walker - 4 wheeled for community ambulation   OCCUPATION: n/a   PLOF: Independent return to pain-free household mobility, ambulation, household chores   PATIENT GOALS: To return to ambulating with regular shoes and cane; address pain in foot, hip/back  NEXT MD VISIT: 03/30/2024  OBJECTIVE:  Note: Objective measures were completed at Evaluation unless otherwise noted.  DIAGNOSTIC FINDINGS:   Per referring provider office note (01/27/2024): Xrays reviewed. Acute fracture noted to proximal fifth metatarsal noted through lateral half of bone just distal to tarsometatarsal joint. Fracture line appears to be nearly completely healed.   PATIENT SURVEYS:  LEFS 22/80  COGNITION: Overall cognitive status: Within functional limits for tasks assessed     SENSATION: Not tested  PALPATION: Moderate tenderness to palpation and proximal end of 5th metatarsal  LUMBAR ROM:   Active  A/PROM  eval  Flexion 80%  Extension 30%, back and hip pain reported  Right lateral flexion 75%  Left lateral flexion 75%  Right rotation 30%, stiffness  Left rotation 40%, stiffness   (Blank rows = not tested)   LOWER EXTREMITY ROM:  Active ROM Right eval Left eval Left 03/12/24 Left 04/01/24  Hip flexion      Hip extension      Hip abduction      Hip adduction      Hip internal rotation      Hip external rotation      Knee flexion      Knee extension      Ankle dorsiflexion 10 -5 A: lacking 2 deg  P: neutral 5 deg   Ankle plantarflexion 35 28  30 deg  Ankle inversion 16 14    Ankle eversion 15 3     (Blank rows = not tested)  LOWER EXTREMITY MMT:   MMT Right eval Left eval R/L 6/10  Hip flexion   4/3+  Hip extension     Hip abduction   3+/3+  Hip adduction     Hip internal rotation     Hip external rotation     Knee flexion   4*/4  Knee extension   4/4  Ankle  dorsiflexion     Ankle plantarflexion     Ankle inversion     Ankle eversion      (Blank rows = not tested)   FUNCTIONAL TESTS:  5 times sit to stand: 16 seconds with UE, reports burning in foot.   03/12/24: 5xSTS 11sec w UE support, no foot pain  GAIT: Distance walked: 30 ft from lobby to evaluation area Assistive device utilized: Environmental consultant - 4 wheeled Level of assistance: Complete Independence Comments: antalgic gait pattern with decreased L stance time                                                                                                                                TREATMENT DATE:   [facility] Adult PT Treatment:                                                DATE: 04/07/24  Neuromuscular re-ed: AP ball roll w/ pressure 2x12 for motor control Standing march w/ airex, UE support 2x8 BIL   Therapeutic Activity: Nu step LE only L5  Cone tap, standing w/ counter support 2x8 BIL cues for posture and pacing ~75ft walking w/ SPC, cues for posture and WB symmetry, education on safety w/ mobility and encouraging continued rollator use Sidestepping along counter 3 laps w/ UE support cues for alignment Pacing of tasks, therapeutic rest breaks PRN, education on activity modification/pacing    [facility] Adult PT Treatment:                                                DATE: 04/01/2024  Therapeutic Exercise: Nu step LE x L6 during subjective  Seated heel/toe raises 2x 10 10#  LAQ 5# 2x8 Seated dynadisc DF/PF AAROM x10 Seated dynadisc ev/inv AAROM x10 cues for reduced knee compensations  Neuromuscular re-ed: Seated dynadisc circles CW/CCW x10 each cues for reduced knee compensations  Therapeutic Activity: BP 137/66 prior to standing activities at end of session Sit to stand 3 x 5  Toe taps to 4 step, 2 x 30 sec Required seated rest break following standing activities     [facility] Adult PT Treatment:                                                DATE: 03/26/24 Vitals:  HR 70s, SpO2 96-97% RA, BP 148/70 Therapeutic Exercise: Seated heel/toe raises 3x8 5#  LAQ 5# 2x8 Seated dynadisc DF/PF AAROM x10 Seated dynadisc ev/inv AAROM x10 cues for reduced knee compensations Blue band PF  2x12  Neuromuscular re-ed: Seated dynadisc circles CW/CCW x10 each cues for reduced knee compensations Heel prop ankle circles x10 CW/CCW cues for motor control AP rockerboard x15 cues for reduced toe compensations  Self Care: Vitals, education/discussion re: fatigue management and encouraged her to communicate with providers     PATIENT EDUCATION:  Education details: rationale for interventions, HEP  Person educated: Patient Education method: Explanation, Demonstration, Tactile cues, Verbal cues Education comprehension: verbalized understanding, returned demonstration, verbal cues required, tactile cues required, and needs further education     HOME EXERCISE PROGRAM:  Access Code: [email or web address] URL: [email or web address] Date: 03/19/2024 Prepared by: Marko Molt  Program Notes Remember to look for a Sock Aid to help with putting on your compression socks.   Exercises - Ankle Inversion Eversion Towel Slide  - 1 x daily - 3 x weekly - 2 sets - 10 reps - Seated Toe Curl  - 1 x daily - 3 x weekly - 2 sets - 10 reps - Toe Spreading  - 1 x daily - 3 x weekly - 2 sets - 10 reps - Seated Hip Abduction with Resistance  - 1 x daily - 3 x weekly - 3 sets - 10 reps - Seated Hip Adduction Isometrics with Ball  - 1 x daily - 3 x weekly - 1 sets - 10 reps - 10 hold - Seated Heel Raise  - 1 x daily - 3 x weekly - 2 sets - 10 reps - Seated Ankle Eversion with Resistance  - 1 x daily - 3 x weekly - 2 sets - 10 reps - Seated Ankle Plantar Flexion with Resistance Loop  - 1 x daily - 3 x weekly - 2 sets - 10 reps - Seated Ankle Dorsiflexion with Resistance  - 1 x daily - 3 x weekly - 2 sets - 10 reps - Seated Ankle Inversion with Anchored Resistance  - 1 x daily - 3 x  weekly - 2 sets - 10 reps  ASSESSMENT:  CLINICAL IMPRESSION:  04/07/2024: Pt arrives w/ report of improving pain and energy levels, reports BP has been more stable. Given this, today we continue to increase time in standing to facilitate WB tolerance, balance, and LE strength. She does well with this - continues to require intermittent rest breaks but reduced compared to recent sessions. No adverse events, pt tolerates well overall with report of  mild fatigue on departure but improved compared to recent sessions. Recommend continuing along current POC in order to address relevant deficits and improve functional tolerance.Pt departs today's session in no acute distress, all voiced questions/concerns addressed appropriately from PT perspective.    EVAL: [name] is a 83 y.o. female who was seen today for physical therapy evaluation and treatment for Left Foot Pain with mobility deficits, related to slow healing (>6 month) 5th metatarsal fracture. She is demonstrating decreased L ankle AROM, decreased L ankle MMT, altered gait mechanics, and decreased STS time. She has developed BIL hip and low back pain that is likely related to extended time ambulating with CAM boot, which she is currently weaning from. She has related pain and difficulty with walking, ADLs/IADLs. She also remains at risk for falls, and requires patient education and PT intervention to decrease risk of falls. She requires skilled PT services at this time to address relevant deficits and improve overall function.     OBJECTIVE IMPAIRMENTS: Abnormal gait, decreased activity tolerance, decreased balance, decreased mobility, difficulty walking, decreased ROM, decreased strength, increased edema, and pain.  ACTIVITY LIMITATIONS: standing, squatting, and stairs  PARTICIPATION LIMITATIONS: meal prep, cleaning, laundry, shopping, and community activity  PERSONAL FACTORS: Age, Time since onset of injury/illness/exacerbation, and 3+ comorbidities:  Relevant PMHx includes anxiety, arthritis, bradycardia, carotid artery occlusion, depression, DM type II, fibromyalgia, glaucoma, hearing loss, HTN, CVA, carotid stenosis, scoliosis of lumbar spine are also affecting patient's functional outcome.   REHAB POTENTIAL: Fair    CLINICAL DECISION MAKING: Evolving/moderate complexity  EVALUATION COMPLEXITY: Moderate   GOALS: Goals reviewed with patient? YES  SHORT TERM GOALS: Target date: 03/05/2024  Patient will be independent with initial home program at least 3 days/week.  Baseline: provided at eval 03/12/24: reports good HEP adherence Goal Status: met   2.  Patient will demonstrate complete 5 time Sit-to-Stand test in 14 seconds or less in order to demonstrate decreasing risk of falls.  Baseline: see objective measures 03/12/24: 11 sec Goal Status: met  3.  Patient will demonstrate improved L ankle DF AROM to at least neutral (0 degrees).  Baseline: lacking 5 degrees of DF 03/12/24: lacking 2 actively Goal Status: PROGRESSING   LONG TERM GOALS: Target date: 04/02/2024 extended to 05/19/2024   Patient will report improved overall functional ability with LEFS score of 60/80 or greater.  Baseline: 22/80 6/10: 27/80 Goal Status: Ongoing   2.  Patient will return to safe ambulation with [facility] for household and community ambulation.  Baseline: ambulating with antalgic gait with rollator  6/10: feeling safe with walker with reduced antalgic gait, not using cane Goal status: Ongoing  3.  Patient will demonstrate at least 4+/5 MMT with LQ testing in L LE. Baseline: see objective measures  6/10: ongoing Goal status: ongoing  4.  Patient will report ability to perform household chores and ADLs with no greater than 6/89 foot pain.  Baseline: up to 10/10 pain; moderate-to-severe difficulty perform activities around her home  6/10: 5/10 Goal status: ongoing     PLAN:  PT FREQUENCY: 1-2x/week  PT DURATION: 8 weeks  PLANNED  INTERVENTIONS: [id number]- PT Re-evaluation, 97750- Physical Performance Testing, 97110-Therapeutic exercises, 97530- Therapeutic activity, [id number]- Neuromuscular re-education, 97535- Self Care, [id number]- Manual therapy, [id number]- Gait training, [id number]- Aquatic Therapy, [phone]- Vasopneumatic device, Patient/Family education, Balance training, Stair training, Taping, Joint mobilization, Cryotherapy, and Moist heat  PLAN FOR NEXT SESSION:  continue working on foot/ankle mobility and strengthening within pt tolerance.    Alm DELENA Jenny PT, DPT 04/07/2024 11:28 AM

## 2024-04-07 ENCOUNTER — Ambulatory Visit: Payer: Self-pay | Admitting: Physical Therapy

## 2024-04-07 ENCOUNTER — Encounter: Payer: Self-pay | Admitting: Physical Therapy

## 2024-04-07 DIAGNOSIS — R262 Difficulty in walking, not elsewhere classified: Secondary | ICD-10-CM

## 2024-04-07 DIAGNOSIS — M25572 Pain in left ankle and joints of left foot: Secondary | ICD-10-CM | POA: Diagnosis not present

## 2024-04-07 DIAGNOSIS — M5416 Radiculopathy, lumbar region: Secondary | ICD-10-CM | POA: Diagnosis not present

## 2024-04-09 ENCOUNTER — Ambulatory Visit: Admitting: Physical Therapy

## 2024-04-13 NOTE — Therapy (Signed)
 OUTPATIENT PHYSICAL THERAPY TREATMENT     Patient Name: [name] MRN: [id number] DOB:[birth year], 83 y.o., female Today's Date: 04/14/2024  END OF SESSION:  PT End of Session - 04/14/24 1047     Visit Number 14    Number of Visits 16    Date for PT Re-Evaluation 05/19/24    Authorization Type MCR    Progress Note Due on Visit 20    PT Start Time 1048    PT Stop Time 1128    PT Time Calculation (min) 40 min    Activity Tolerance Patient tolerated treatment well              Past Medical History:  Diagnosis Date   Allergy    Anal or rectal pain    sometimes   Anemia    hx of during pregnancy   Anxiety    Arthritis    Asthma    hx of   Bradycardia     I KNOW I HAVE BRADYCARDIA ESPECIALLY WHEN I SLEEP    Carotid artery occlusion    Cataract 2021   bilateral eyes   Chronic back pain    Degenerative joint disease    osteo   Depression    Diabetes mellitus without complication (HCC)    DM type II   Diverticulosis 2003   Dysrhythmia    hx of  due to eye drop and also low heart rate 40's per pt. Dr. Court follows   Elevated total protein    Esophageal dysmotility    Fibromyalgia    GERD (gastroesophageal reflux disease)    subsequent Nissen Fundoplication   Glaucoma    Hearing loss    Heart murmur    was told she had a heart murmur   Hemorrhoids    Hiatal hernia 11/08/2009   Hx of adenomatous colonic polyps 07/02/2002   Hypercalcemia    Hyperlipidemia    Hyperlipidemia    Hypertension    Implantable loop recorder present 11/28/2021   Nausea    Osteoporosis    PONV (postoperative nausea and vomiting)    Rectal bleeding    from hemorrhoids.     Sleep apnea    DOES USE CPAP    Stroke (HCC)    Thrombocytopenia (HCC)    Varicose veins of left lower extremity    Past Surgical History:  Procedure Laterality Date   CARDIAC CATHETERIZATION  03/14/1992   Normal cardiac cath. Normal LV function.   CARDIOVASCULAR STRESS TEST  01/22/2011   No  scintigraphic evidence of inducible ischemia.   CAROTID DOPPLER  03/31/2007   Bilateral ICAs - no evidence of significant diameter reduction, dissectin, tortuosity, FMD, or any other vascular abnormality.   CATARACT EXTRACTION, BILATERAL     ESOPHAGEAL MANOMETRY N/A 11/14/2015   Procedure: ESOPHAGEAL MANOMETRY (EM);  Surgeon: Gustav Shila GAILS, MD;  Location: [facility];  Service: Endoscopy;  Laterality: N/A;   ESOPHAGEAL MANOMETRY N/A 01/18/2021   Procedure: ESOPHAGEAL MANOMETRY (EM);  Surgeon: Shila Gustav GAILS, MD;  Location: [facility];  Service: Endoscopy;  Laterality: N/A;   ESOPHAGOGASTRODUODENOSCOPY N/A 03/08/2021   Procedure: ESOPHAGOGASTRODUODENOSCOPY (EGD);  Surgeon: Shyrl Linnie KIDD, MD;  Location: [facility] OR;  Service: Thoracic;  Laterality: N/A;   EYE SURGERY     bilateral cataracts; bilateral stents   FOOT BONE EXCISION     GASTRIC RESECTION  2009   GLAUCOMA SURGERY     JOINT REPLACEMENT     right knee Dr. Melodi 06-23-18   KNEE  SURGERY Bilateral    NISSEN FUNDOPLICATION  2000   with subsequent takedown in 2009   TOTAL KNEE ARTHROPLASTY Left 06/10/2017   Procedure: LEFT  TOTAL KNEE ARTHROPLASTY;  Surgeon: Melodi Lerner, MD;  Location: [facility];  Service: Orthopedics;  Laterality: Left;  Adductor Block   TOTAL KNEE ARTHROPLASTY Right 06/23/2018   Procedure: RIGHT TOTAL KNEE ARTHROPLASTY;  Surgeon: Melodi Lerner, MD;  Location: [facility];  Service: Orthopedics;  Laterality: Right;   TRANSCAROTID ARTERY REVASCULARIZATION  Right 08/09/2023   Procedure: TRANSCAROTID ARTERY REVASCULARIZATION;  Surgeon: Magda Debby SAILOR, MD;  Location: [facility] OR;  Service: Vascular;  Laterality: Right;   TRANSTHORACIC ECHOCARDIOGRAM  12/21/2010   EF 60%, moderate LVH,    TUBAL LIGATION     XI ROBOTIC ASSISTED HIATAL HERNIA REPAIR N/A 03/08/2021   Procedure: XI ROBOTIC ASSISTED LAPAROSCOPY WITH LYSIS OF ADHESIONS;  Surgeon: Shyrl Linnie KIDD, MD;  Location: [facility] OR;  Service: Thoracic;   Laterality: N/A;   Patient Active Problem List   Diagnosis Date Noted   Closed nondisplaced fracture of fifth metatarsal bone of left foot 08/07/2023   Stroke (HCC) 08/07/2023   Carotid stenosis, asymptomatic, right 08/07/2023   CVA (cerebral vascular accident) (HCC) 08/06/2023   Syncope 10/22/2022   Acute renal failure superimposed on stage 3a chronic kidney disease (HCC) 05/22/2022   AKI (acute kidney injury) (HCC) 05/21/2022   Hiatal hernia 03/08/2021   Heartburn    Ineffective esophageal motility    Dyspnea on exertion 07/24/2020   Change in bowel habits 08/22/2018   Knee stiff 07/23/2018   Degeneration of lumbar intervertebral disc 02/04/2018   Scoliosis of lumbar spine 02/04/2018   Sinus bradycardia 07/27/2016   Cough    Essential hypertension 11/24/2014   Gonalgia 05/13/2013   OA (osteoarthritis) of knee 05/13/2013   Degenerative joint disease involving multiple joints 01/19/2013   Rheumatoid arthritis (HCC) 01/19/2013   Thyroid  nodule 06/11/2012   Elevated total protein    Cyst of thyroid  10/29/2011   Hemorrhoids, internal 05/28/2011   Acid reflux 02/14/2011   Avitaminosis D 02/13/2011   Glaucoma 11/28/2010   HLD (hyperlipidemia) 12/14/2009   Internal and external bleeding hemorrhoids 12/14/2009   DIVERTICULOSIS, COLON 12/14/2009   Joint pain 12/14/2009   BACK PAIN, CHRONIC 12/14/2009   FIBROMYALGIA 12/14/2009   OSTEOPOROSIS 12/14/2009   Sleep apnea 12/14/2009   History of colonic polyps 12/14/2009   GASTRIC POLYP, HX OF 12/14/2009   Diaphragmatic hernia 10/04/2009   Depression 09/29/2009   REFLUX ESOPHAGITIS 09/29/2009   GERD 09/29/2009   Constipation 09/29/2009   DYSPHAGIA 09/29/2009    PCP: Roanna Ezekiel [name], MD  REFERRING PROVIDER: Joya Stabs, DPM  REFERRING DIAG: 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 She reports inc foot and shoulder pain w/ increased activity around the house (cleaning storage room). She does relate that her foot pain feels more consistent with her prior plantar fascia issues. Feels energy is doing well. Felt good after last session.    EVAL: Patient presents to PT with history of slow healing 5th metatarsal fracture after a fall about 6 months ago. She states that she doesn't have a clear memory of the fall. The doctors  thought that I had a stroke. I went to lock the door and the next thing that I remember is that I was on the floor and partially on the end table.  She states that she is still using the bone stimulator as instructed by referring provider. She is now able to wean from CAM boot. She states that she started walking to the bathroom without the boot at home. Today is her first day ambulating outside of the house without the CAM boot. She additionally reports hip and back pain over the past several months while ambulating with CAM boot and rollator.      PERTINENT HISTORY: Relevant PMHx includes anxiety, arthritis, bradycardia, carotid artery occlusion, depression, DM type II, fibromyalgia, glaucoma, hearing loss, HTN, CVA, carotid stenosis, scoliosis of lumbar spine   PAIN:  Are you having pain?  Yes: NPRS scale: 2-3/10 current, 10/10 (occasionally, nerve pain) Pain location: L foot (lateral aspect), hips, lower back   Pain description: dull ache  Aggravating factors: weight bearing Relieving factors: sitting, laying down  PRECAUTIONS: Fall  RED FLAGS: None   WEIGHT BEARING RESTRICTIONS: Yes WBAT, wean/discontinue CAM boot and transition to regular shoes  FALLS:  Has patient fallen in last 6 months? Yes. Number of falls 1, see  MOI   LIVING ENVIRONMENT: Lives with: lives with their spouse Lives in: House/apartment Stairs: Yes: two external steps with railing  Has following equipment at home: Single point cane and Walker - 4 wheeled for community ambulation   OCCUPATION: n/a   PLOF: Independent return to pain-free household mobility, ambulation, household chores   PATIENT GOALS: To return to ambulating with regular shoes and cane; address pain in foot, hip/back  NEXT MD VISIT: 03/30/2024  OBJECTIVE:  Note: Objective measures were completed at Evaluation unless otherwise noted.  DIAGNOSTIC FINDINGS:   Per referring provider office note (01/27/2024): Xrays reviewed. Acute fracture noted to proximal fifth metatarsal noted through lateral half of bone just distal to tarsometatarsal joint. Fracture line appears to be nearly completely healed.   PATIENT SURVEYS:  LEFS 22/80  COGNITION: Overall cognitive status: Within functional limits for tasks assessed     SENSATION: Not tested  PALPATION: Moderate tenderness to palpation and proximal end of 5th metatarsal  LUMBAR ROM:   Active  A/PROM  eval  Flexion 80%  Extension 30%, back and hip pain reported  Right lateral flexion 75%  Left lateral flexion 75%  Right rotation 30%, stiffness  Left rotation 40%, stiffness   (Blank rows = not tested)   LOWER EXTREMITY ROM:  Active ROM Right eval Left eval Left 03/12/24 Left 04/01/24  Hip flexion      Hip extension      Hip abduction      Hip adduction      Hip internal rotation      Hip external rotation      Knee flexion      Knee extension      Ankle dorsiflexion 10 -5 A: lacking 2 deg  P: neutral 5 deg   Ankle plantarflexion 35 28  30 deg  Ankle inversion 16 14    Ankle eversion 15 3     (Blank rows = not tested)  LOWER EXTREMITY MMT:   MMT Right eval Left eval R/L 6/10  Hip flexion   4/3+  Hip extension     Hip abduction   3+/3+  Hip adduction     Hip internal rotation     Hip  external rotation  Knee flexion   4*/4  Knee extension   4/4  Ankle dorsiflexion     Ankle plantarflexion     Ankle inversion     Ankle eversion      (Blank rows = not tested)   FUNCTIONAL TESTS:  5 times sit to stand: 16 seconds with UE, reports burning in foot.   03/12/24: 5xSTS 11sec w UE support, no foot pain  GAIT: Distance walked: 30 ft from lobby to evaluation area Assistive device utilized: Environmental consultant - 4 wheeled Level of assistance: Complete Independence Comments: antalgic gait pattern with decreased L stance time                                                                                                                                TREATMENT DATE:   [facility] Adult PT Treatment:                                                DATE: 04/14/24 Therapeutic Exercise: Landy band hamstring curl w/ slider 2x12 BIL LAQ 5# 2x12 Blue band ankle PF 2x12 BIL cues for reduced knee compensations   Therapeutic Activity: Fwd/retro step progression, LLE stance in // bars 2x8 Mini squat // bars 2x6 cues to relax UT, breath control, hip mechanics Pacing of activities, therapeutic rest breaks Education on activity modification, pacing    [facility] Adult PT Treatment:                                                DATE: 04/07/24  Neuromuscular re-ed: AP ball roll w/ pressure 2x12 for motor control Standing march w/ airex, UE support 2x8 BIL   Therapeutic Activity: Nu step LE only L5  Cone tap, standing w/ counter support 2x8 BIL cues for posture and pacing ~29ft walking w/ SPC, cues for posture and WB symmetry, education on safety w/ mobility and encouraging continued rollator use Sidestepping along counter 3 laps w/ UE support cues for alignment Pacing of tasks, therapeutic rest breaks PRN, education on activity modification/pacing    [facility] Adult PT Treatment:                                                DATE: 04/01/2024  Therapeutic Exercise: Nu step LE x L6 during  subjective  Seated heel/toe raises 2x 10 10#  LAQ 5# 2x8 Seated dynadisc DF/PF AAROM x10 Seated dynadisc ev/inv AAROM x10 cues for reduced knee compensations  Neuromuscular re-ed: Seated dynadisc circles CW/CCW x10 each cues for reduced knee compensations  Therapeutic  Activity: BP 137/66 prior to standing activities at end of session Sit to stand 3 x 5  Toe taps to 4 step, 2 x 30 sec Required seated rest break following standing activities     PATIENT EDUCATION:  Education details: rationale for interventions, HEP  Person educated: Patient Education method: Explanation, Demonstration, Tactile cues, Verbal cues Education comprehension: verbalized understanding, returned demonstration, verbal cues required, tactile cues required, and needs further education     HOME EXERCISE PROGRAM:  Access Code: [email or web address] URL: [email or web address] Date: 03/19/2024 Prepared by: Marko Molt  Program Notes Remember to look for a Sock Aid to help with putting on your compression socks.   Exercises - Ankle Inversion Eversion Towel Slide  - 1 x daily - 3 x weekly - 2 sets - 10 reps - Seated Toe Curl  - 1 x daily - 3 x weekly - 2 sets - 10 reps - Toe Spreading  - 1 x daily - 3 x weekly - 2 sets - 10 reps - Seated Hip Abduction with Resistance  - 1 x daily - 3 x weekly - 3 sets - 10 reps - Seated Hip Adduction Isometrics with Ball  - 1 x daily - 3 x weekly - 1 sets - 10 reps - 10 hold - Seated Heel Raise  - 1 x daily - 3 x weekly - 2 sets - 10 reps - Seated Ankle Eversion with Resistance  - 1 x daily - 3 x weekly - 2 sets - 10 reps - Seated Ankle Plantar Flexion with Resistance Loop  - 1 x daily - 3 x weekly - 2 sets - 10 reps - Seated Ankle Dorsiflexion with Resistance  - 1 x daily - 3 x weekly - 2 sets - 10 reps - Seated Ankle Inversion with Anchored Resistance  - 1 x daily - 3 x weekly - 2 sets - 10 reps  ASSESSMENT:  CLINICAL IMPRESSION:  04/14/2024: Pt arrives w/ report  of some increase in pain w/ increase in activity around the house. Today we begin with open chain strengthening warm up, progressing into more emphasis on balance, weaning UE support. Tolerates activity well, continues to require intermittent rest breaks and cues as above. No adverse events, reports reduced foot/shoulder pain on departure. Recommend continuing along current POC in order to address relevant deficits and improve functional tolerance. Pt departs today's session in no acute distress, all voiced questions/concerns addressed appropriately from PT perspective.     EVAL: Elanah is a 83 y.o. female who was seen today for physical therapy evaluation and treatment for Left Foot Pain with mobility deficits, related to slow healing (>6 month) 5th metatarsal fracture. She is demonstrating decreased L ankle AROM, decreased L ankle MMT, altered gait mechanics, and decreased STS time. She has developed BIL hip and low back pain that is likely related to extended time ambulating with CAM boot, which she is currently weaning from. She has related pain and difficulty with walking, ADLs/IADLs. She also remains at risk for falls, and requires patient education and PT intervention to decrease risk of falls. She requires skilled PT services at this time to address relevant deficits and improve overall function.     OBJECTIVE IMPAIRMENTS: Abnormal gait, decreased activity tolerance, decreased balance, decreased mobility, difficulty walking, decreased ROM, decreased strength, increased edema, and pain.   ACTIVITY LIMITATIONS: standing, squatting, and stairs  PARTICIPATION LIMITATIONS: meal prep, cleaning, laundry, shopping, and community activity  PERSONAL FACTORS: Age, Time since onset of injury/illness/exacerbation, and 3+  comorbidities: Relevant PMHx includes anxiety, arthritis, bradycardia, carotid artery occlusion, depression, DM type II, fibromyalgia, glaucoma, hearing loss, HTN, CVA, carotid stenosis,  scoliosis of lumbar spine are also affecting patient's functional outcome.   REHAB POTENTIAL: Fair    CLINICAL DECISION MAKING: Evolving/moderate complexity  EVALUATION COMPLEXITY: Moderate   GOALS: Goals reviewed with patient? YES  SHORT TERM GOALS: Target date: 03/05/2024  Patient will be independent with initial home program at least 3 days/week.  Baseline: provided at eval 03/12/24: reports good HEP adherence Goal Status: met   2.  Patient will demonstrate complete 5 time Sit-to-Stand test in 14 seconds or less in order to demonstrate decreasing risk of falls.  Baseline: see objective measures 03/12/24: 11 sec Goal Status: met  3.  Patient will demonstrate improved L ankle DF AROM to at least neutral (0 degrees).  Baseline: lacking 5 degrees of DF 03/12/24: lacking 2 actively Goal Status: PROGRESSING   LONG TERM GOALS: Target date: 04/02/2024 extended to 05/19/2024   Patient will report improved overall functional ability with LEFS score of 60/80 or greater.  Baseline: 22/80 6/10: 27/80 Goal Status: Ongoing   2.  Patient will return to safe ambulation with [facility] for household and community ambulation.  Baseline: ambulating with antalgic gait with rollator  6/10: feeling safe with walker with reduced antalgic gait, not using cane Goal status: Ongoing  3.  Patient will demonstrate at least 4+/5 MMT with LQ testing in L LE. Baseline: see objective measures  6/10: ongoing Goal status: ongoing  4.  Patient will report ability to perform household chores and ADLs with no greater than 6/89 foot pain.  Baseline: up to 10/10 pain; moderate-to-severe difficulty perform activities around her home  6/10: 5/10 Goal status: ongoing     PLAN:  PT FREQUENCY: 1-2x/week  PT DURATION: 8 weeks  PLANNED INTERVENTIONS: [id number]- PT Re-evaluation, 97750- Physical Performance Testing, 97110-Therapeutic exercises, 97530- Therapeutic activity, [id number]- Neuromuscular re-education, 97535- Self  Care, [id number]- Manual therapy, [id number]- Gait training, [phone]- Aquatic Therapy, [phone]- Vasopneumatic device, Patient/Family education, Balance training, Stair training, Taping, Joint mobilization, Cryotherapy, and Moist heat  PLAN FOR NEXT SESSION:  continue working on foot/ankle mobility and strengthening within pt tolerance.    Alm DELENA Jenny PT, DPT 04/14/2024 11:32 AM

## 2024-04-14 ENCOUNTER — Ambulatory Visit: Attending: Podiatry | Admitting: Physical Therapy

## 2024-04-14 ENCOUNTER — Encounter: Payer: Self-pay | Admitting: Physical Therapy

## 2024-04-14 DIAGNOSIS — R262 Difficulty in walking, not elsewhere classified: Secondary | ICD-10-CM | POA: Insufficient documentation

## 2024-04-14 DIAGNOSIS — M25572 Pain in left ankle and joints of left foot: Secondary | ICD-10-CM | POA: Insufficient documentation

## 2024-04-14 DIAGNOSIS — M5416 Radiculopathy, lumbar region: Secondary | ICD-10-CM | POA: Insufficient documentation

## 2024-04-16 ENCOUNTER — Ambulatory Visit

## 2024-04-20 NOTE — Progress Notes (Signed)
 [facility] Summary Report / Loop Recorder

## 2024-04-21 ENCOUNTER — Encounter: Payer: Self-pay | Admitting: Physical Therapy

## 2024-04-21 ENCOUNTER — Ambulatory Visit: Admitting: Physical Therapy

## 2024-04-21 DIAGNOSIS — R262 Difficulty in walking, not elsewhere classified: Secondary | ICD-10-CM

## 2024-04-21 DIAGNOSIS — M5416 Radiculopathy, lumbar region: Secondary | ICD-10-CM | POA: Diagnosis not present

## 2024-04-21 DIAGNOSIS — M25572 Pain in left ankle and joints of left foot: Secondary | ICD-10-CM

## 2024-04-21 NOTE — Therapy (Addendum)
 PHYSICAL THERAPY UNPLANNED DISCHARGE SUMMARY   Visits from Start of Care: 15  Current functional level related to goals / functional outcomes: Current status unknown   Remaining deficits: Current status unknown   Education / Equipment: Pt has not returned since visit listed below  Patient goals were not assessed. Patient is being discharged due to not returning since the last visit.  (the note below was addended to include the above D/C summary on 09/29/2024)     Patient Name: [name] MRN: [id number] DOB:Nov 09, 1940, 83 y.o., female Today's Date: 04/21/2024  END OF SESSION:  PT End of Session - 04/21/24 1101     Visit Number 15    Number of Visits 16    Date for PT Re-Evaluation 05/19/24    Authorization Type MCR    Progress Note Due on Visit 20    PT Start Time 1100    PT Stop Time 1141    PT Time Calculation (min) 41 min    Activity Tolerance Patient tolerated treatment well              Past Medical History:  Diagnosis Date   Allergy    Anal or rectal pain    sometimes   Anemia    hx of during pregnancy   Anxiety    Arthritis    Asthma    hx of   Bradycardia     I KNOW I HAVE BRADYCARDIA ESPECIALLY WHEN I SLEEP    Carotid artery occlusion    Cataract 2021   bilateral eyes   Chronic back pain    Degenerative joint disease    osteo   Depression    Diabetes mellitus without complication (HCC)    DM type II   Diverticulosis 2003   Dysrhythmia    hx of  due to eye drop and also low heart rate 40's per pt. Dr. Court follows   Elevated total protein    Esophageal dysmotility    Fibromyalgia    GERD (gastroesophageal reflux disease)    subsequent Nissen Fundoplication   Glaucoma    Hearing loss    Heart murmur    was told she had a heart murmur   Hemorrhoids    Hiatal hernia 11/08/2009   Hx of adenomatous colonic polyps 07/02/2002   Hypercalcemia    Hyperlipidemia    Hyperlipidemia    Hypertension    Implantable loop recorder  present 11/28/2021   Nausea    Osteoporosis    PONV (postoperative nausea and vomiting)    Rectal bleeding    from hemorrhoids.     Sleep apnea    DOES USE CPAP    Stroke (HCC)    Thrombocytopenia (HCC)    Varicose veins of left lower extremity    Past Surgical History:  Procedure Laterality Date   CARDIAC CATHETERIZATION  03/14/1992   Normal cardiac cath. Normal LV function.   CARDIOVASCULAR STRESS TEST  01/22/2011   No scintigraphic evidence of inducible ischemia.   CAROTID DOPPLER  03/31/2007   Bilateral ICAs - no evidence of significant diameter reduction, dissectin, tortuosity, FMD, or any other vascular abnormality.   CATARACT EXTRACTION, BILATERAL     ESOPHAGEAL MANOMETRY N/A 11/14/2015   Procedure: ESOPHAGEAL MANOMETRY (EM);  Surgeon: Gustav Shila GAILS, MD;  Location: [facility];  Service: Endoscopy;  Laterality: N/A;   ESOPHAGEAL MANOMETRY N/A 01/18/2021   Procedure: ESOPHAGEAL MANOMETRY (EM);  Surgeon: Shila Gustav GAILS, MD;  Location: [facility];  Service: Endoscopy;  Laterality:  N/A;   ESOPHAGOGASTRODUODENOSCOPY N/A 03/08/2021   Procedure: ESOPHAGOGASTRODUODENOSCOPY (EGD);  Surgeon: Shyrl Linnie KIDD, MD;  Location: [facility] OR;  Service: Thoracic;  Laterality: N/A;   EYE SURGERY     bilateral cataracts; bilateral stents   FOOT BONE EXCISION     GASTRIC RESECTION  2009   GLAUCOMA SURGERY     JOINT REPLACEMENT     right knee Dr. Melodi 06-23-18   KNEE SURGERY Bilateral    NISSEN FUNDOPLICATION  2000   with subsequent takedown in 2009   TOTAL KNEE ARTHROPLASTY Left 06/10/2017   Procedure: LEFT  TOTAL KNEE ARTHROPLASTY;  Surgeon: Melodi Lerner, MD;  Location: [facility];  Service: Orthopedics;  Laterality: Left;  Adductor Block   TOTAL KNEE ARTHROPLASTY Right 06/23/2018   Procedure: RIGHT TOTAL KNEE ARTHROPLASTY;  Surgeon: Melodi Lerner, MD;  Location: [facility];  Service: Orthopedics;  Laterality: Right;   TRANSCAROTID ARTERY REVASCULARIZATION  Right 08/09/2023    Procedure: TRANSCAROTID ARTERY REVASCULARIZATION;  Surgeon: Magda Debby SAILOR, MD;  Location: [facility] OR;  Service: Vascular;  Laterality: Right;   TRANSTHORACIC ECHOCARDIOGRAM  12/21/2010   EF 60%, moderate LVH,    TUBAL LIGATION     XI ROBOTIC ASSISTED HIATAL HERNIA REPAIR N/A 03/08/2021   Procedure: XI ROBOTIC ASSISTED LAPAROSCOPY WITH LYSIS OF ADHESIONS;  Surgeon: Shyrl Linnie KIDD, MD;  Location: [facility] OR;  Service: Thoracic;  Laterality: N/A;   Patient Active Problem List   Diagnosis Date Noted   Closed nondisplaced fracture of fifth metatarsal bone of left foot 08/07/2023   Stroke (HCC) 08/07/2023   Carotid stenosis, asymptomatic, right 08/07/2023   CVA (cerebral vascular accident) (HCC) 08/06/2023   Syncope 10/22/2022   Acute renal failure superimposed on stage 3a chronic kidney disease (HCC) 05/22/2022   AKI (acute kidney injury) (HCC) 05/21/2022   Hiatal hernia 03/08/2021   Heartburn    Ineffective esophageal motility    Dyspnea on exertion 07/24/2020   Change in bowel habits 08/22/2018   Knee stiff 07/23/2018   Degeneration of lumbar intervertebral disc 02/04/2018   Scoliosis of lumbar spine 02/04/2018   Sinus bradycardia 07/27/2016   Cough    Essential hypertension 11/24/2014   Gonalgia 05/13/2013   OA (osteoarthritis) of knee 05/13/2013   Degenerative joint disease involving multiple joints 01/19/2013   Rheumatoid arthritis (HCC) 01/19/2013   Thyroid  nodule 06/11/2012   Elevated total protein    Cyst of thyroid  10/29/2011   Hemorrhoids, internal 05/28/2011   Acid reflux 02/14/2011   Avitaminosis D 02/13/2011   Glaucoma 11/28/2010   HLD (hyperlipidemia) 12/14/2009   Internal and external bleeding hemorrhoids 12/14/2009   DIVERTICULOSIS, COLON 12/14/2009   Joint pain 12/14/2009   BACK PAIN, CHRONIC 12/14/2009   FIBROMYALGIA 12/14/2009   OSTEOPOROSIS 12/14/2009   Sleep apnea 12/14/2009   History of colonic polyps 12/14/2009   GASTRIC POLYP, HX OF 12/14/2009    Diaphragmatic hernia 10/04/2009   Depression 09/29/2009   REFLUX ESOPHAGITIS 09/29/2009   GERD 09/29/2009   Constipation 09/29/2009   DYSPHAGIA 09/29/2009    PCP: Roanna Ezekiel [name], MD  REFERRING PROVIDER: Joya Stabs, DPM  REFERRING DIAG: 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  She feels her energy is improving somewhat and she is able to do more.     EVAL: Patient presents to PT with history of slow healing 5th metatarsal fracture after a fall about 6 months ago. She states that she doesn't have a clear memory of the fall. The doctors thought that I had a stroke. I went to lock the door and the next thing that I remember is that I was on the floor and partially on the end table.  She states that she is still using the bone stimulator as instructed by referring provider. She is now able to wean from CAM boot. She states that she started walking to the bathroom without the boot at home. Today is her first day ambulating outside of the house without the CAM boot. She additionally reports hip and back pain over the past several months while ambulating with CAM boot and rollator.      PERTINENT HISTORY: Relevant PMHx includes anxiety, arthritis, bradycardia, carotid artery occlusion, depression, DM type II, fibromyalgia, glaucoma, hearing loss, HTN, CVA, carotid stenosis, scoliosis of lumbar spine   PAIN:  Are you having pain?  Yes: NPRS scale: 2-3/10 current, 10/10 (occasionally, nerve pain) Pain location: L foot (lateral aspect), hips, lower back   Pain description: dull ache   Aggravating factors: weight bearing Relieving factors: sitting, laying down  PRECAUTIONS: Fall  RED FLAGS: None   WEIGHT BEARING RESTRICTIONS: Yes WBAT, wean/discontinue CAM boot and transition to regular shoes  FALLS:  Has patient fallen in last 6 months? Yes. Number of falls 1, see MOI   LIVING ENVIRONMENT: Lives with: lives with their spouse Lives in: House/apartment Stairs: Yes: two external steps with railing  Has following equipment at home: Single point cane and Walker - 4 wheeled for community ambulation   OCCUPATION: n/a   PLOF: Independent return to pain-free household mobility, ambulation, household chores   PATIENT GOALS: To return to ambulating with regular shoes and cane; address pain in foot, hip/back  NEXT MD VISIT: 03/30/2024  OBJECTIVE:  Note: Objective measures were completed at Evaluation unless otherwise noted.  DIAGNOSTIC FINDINGS:   Per referring provider office note (01/27/2024): Xrays reviewed. Acute fracture noted to proximal fifth metatarsal noted through lateral half of bone just distal to tarsometatarsal joint. Fracture line appears to be nearly completely healed.   PATIENT SURVEYS:  LEFS 22/80  COGNITION: Overall cognitive status: Within functional limits for tasks assessed     SENSATION: Not tested  PALPATION: Moderate tenderness to palpation and proximal end of 5th metatarsal  LUMBAR ROM:   Active  A/PROM  eval  Flexion 80%  Extension 30%, back and hip pain reported  Right lateral flexion 75%  Left lateral flexion 75%  Right rotation 30%, stiffness  Left rotation 40%, stiffness   (Blank rows = not tested)   LOWER EXTREMITY ROM:  Active ROM Right eval Left eval Left 03/12/24 Left 04/01/24  Hip flexion      Hip extension      Hip abduction      Hip adduction      Hip internal rotation      Hip external rotation      Knee flexion      Knee extension      Ankle dorsiflexion 10 -5 A: lacking 2 deg  P: neutral 5 deg    Ankle plantarflexion 35 28  30 deg  Ankle inversion 16 14    Ankle eversion 15 3     (  Blank rows = not tested)  LOWER EXTREMITY MMT:   MMT Right eval Left eval R/L 6/10  Hip flexion   4/3+  Hip extension     Hip abduction   3+/3+  Hip adduction     Hip internal rotation     Hip external rotation     Knee flexion   4*/4  Knee extension   4/4  Ankle dorsiflexion     Ankle plantarflexion     Ankle inversion     Ankle eversion      (Blank rows = not tested)   FUNCTIONAL TESTS:  5 times sit to stand: 16 seconds with UE, reports burning in foot.   03/12/24: 5xSTS 11sec w UE support, no foot pain  GAIT: Distance walked: 30 ft from lobby to evaluation area Assistive device utilized: Walker - 4 wheeled Level of assistance: Complete Independence Comments: antalgic gait pattern with decreased L stance time                                                                                                                                TREATMENT DATE:   [facility] Adult PT Treatment:                                                DATE: 04/21/24 Therapeutic Exercise: LAQ 5# 2x12  Therapeutic Activity: Fwd/retro waling in // with light UE support Lateral walking in // with light UE support Walking 185' with SPC and CGA with 1 rest break Mini squat // bars 2x6 cues to relax UT, breath control, hip mechanics Unilateral cone step overs with L UE support in // Pacing of activities, therapeutic rest breaks     [facility] Adult PT Treatment:                                                DATE: 04/07/24  Neuromuscular re-ed: AP ball roll w/ pressure 2x12 for motor control Standing march w/ airex, UE support 2x8 BIL   Therapeutic Activity: Nu step LE only L5  Cone tap, standing w/ counter support 2x8 BIL cues for posture and pacing ~57ft walking w/ SPC, cues for posture and WB symmetry, education on safety w/ mobility and encouraging continued rollator use Sidestepping along counter 3  laps w/ UE support cues for alignment Pacing of tasks, therapeutic rest breaks PRN, education on activity modification/pacing    [facility] Adult PT Treatment:  DATE: 04/01/2024  Therapeutic Exercise: Nu step LE x L6 during subjective  Seated heel/toe raises 2x 10 10#  LAQ 5# 2x8 Seated dynadisc DF/PF AAROM x10 Seated dynadisc ev/inv AAROM x10 cues for reduced knee compensations  Neuromuscular re-ed: Seated dynadisc circles CW/CCW x10 each cues for reduced knee compensations  Therapeutic Activity: BP 137/66 prior to standing activities at end of session Sit to stand 3 x 5  Toe taps to 4 step, 2 x 30 sec Required seated rest break following standing activities     PATIENT EDUCATION:  Education details: rationale for interventions, HEP  Person educated: Patient Education method: Explanation, Demonstration, Tactile cues, Verbal cues Education comprehension: verbalized understanding, returned demonstration, verbal cues required, tactile cues required, and needs further education     HOME EXERCISE PROGRAM:  Access Code: [email or web address] URL: [email or web address] Date: 03/19/2024 Prepared by: Marko Molt  Program Notes Remember to look for a Sock Aid to help with putting on your compression socks.   Exercises - Ankle Inversion Eversion Towel Slide  - 1 x daily - 3 x weekly - 2 sets - 10 reps - Seated Toe Curl  - 1 x daily - 3 x weekly - 2 sets - 10 reps - Toe Spreading  - 1 x daily - 3 x weekly - 2 sets - 10 reps - Seated Hip Abduction with Resistance  - 1 x daily - 3 x weekly - 3 sets - 10 reps - Seated Hip Adduction Isometrics with Ball  - 1 x daily - 3 x weekly - 1 sets - 10 reps - 10 hold - Seated Heel Raise  - 1 x daily - 3 x weekly - 2 sets - 10 reps - Seated Ankle Eversion with Resistance  - 1 x daily - 3 x weekly - 2 sets - 10 reps - Seated Ankle Plantar Flexion with Resistance Loop  - 1 x daily - 3 x weekly - 2  sets - 10 reps - Seated Ankle Dorsiflexion with Resistance  - 1 x daily - 3 x weekly - 2 sets - 10 reps - Seated Ankle Inversion with Anchored Resistance  - 1 x daily - 3 x weekly - 2 sets - 10 reps  ASSESSMENT:  CLINICAL IMPRESSION:  04/21/2024: Pt arrives with lower foot pain today which she rates as 3/10.  Concentrated on standing strength and endurance today.  Pt is most limited by low back pain with longer duration standing activities which improves with rest.  Did well with SPC with CGA but did report transient increase in R shoulder pain with use of SPC.   EVAL: Lilianne is a 83 y.o. female who was seen today for physical therapy evaluation and treatment for Left Foot Pain with mobility deficits, related to slow healing (>6 month) 5th metatarsal fracture. She is demonstrating decreased L ankle AROM, decreased L ankle MMT, altered gait mechanics, and decreased STS time. She has developed BIL hip and low back pain that is likely related to extended time ambulating with CAM boot, which she is currently weaning from. She has related pain and difficulty with walking, ADLs/IADLs. She also remains at risk for falls, and requires patient education and PT intervention to decrease risk of falls. She requires skilled PT services at this time to address relevant deficits and improve overall function.     OBJECTIVE IMPAIRMENTS: Abnormal gait, decreased activity tolerance, decreased balance, decreased mobility, difficulty walking, decreased ROM, decreased strength, increased edema, and pain.   ACTIVITY LIMITATIONS: standing, squatting,  and stairs  PARTICIPATION LIMITATIONS: meal prep, cleaning, laundry, shopping, and community activity  PERSONAL FACTORS: Age, Time since onset of injury/illness/exacerbation, and 3+ comorbidities: Relevant PMHx includes anxiety, arthritis, bradycardia, carotid artery occlusion, depression, DM type II, fibromyalgia, glaucoma, hearing loss, HTN, CVA, carotid stenosis, scoliosis of  lumbar spine are also affecting patient's functional outcome.   REHAB POTENTIAL: Fair    CLINICAL DECISION MAKING: Evolving/moderate complexity  EVALUATION COMPLEXITY: Moderate   GOALS: Goals reviewed with patient? YES  SHORT TERM GOALS: Target date: 03/05/2024  Patient will be independent with initial home program at least 3 days/week.  Baseline: provided at eval 03/12/24: reports good HEP adherence Goal Status: met   2.  Patient will demonstrate complete 5 time Sit-to-Stand test in 14 seconds or less in order to demonstrate decreasing risk of falls.  Baseline: see objective measures 03/12/24: 11 sec Goal Status: met  3.  Patient will demonstrate improved L ankle DF AROM to at least neutral (0 degrees).  Baseline: lacking 5 degrees of DF 03/12/24: lacking 2 actively Goal Status: PROGRESSING   LONG TERM GOALS: Target date: 04/02/2024 extended to 05/19/2024   Patient will report improved overall functional ability with LEFS score of 60/80 or greater.  Baseline: 22/80 6/10: 27/80 Goal Status: Ongoing   2.  Patient will return to safe ambulation with [facility] for household and community ambulation.  Baseline: ambulating with antalgic gait with rollator  6/10: feeling safe with walker with reduced antalgic gait, not using cane Goal status: Ongoing  3.  Patient will demonstrate at least 4+/5 MMT with LQ testing in L LE. Baseline: see objective measures  6/10: ongoing Goal status: ongoing  4.  Patient will report ability to perform household chores and ADLs with no greater than 6/89 foot pain.  Baseline: up to 10/10 pain; moderate-to-severe difficulty perform activities around her home  6/10: 5/10 Goal status: ongoing     PLAN:  PT FREQUENCY: 1-2x/week  PT DURATION: 8 weeks  PLANNED INTERVENTIONS: [id number]- PT Re-evaluation, 97750- Physical Performance Testing, 97110-Therapeutic exercises, 97530- Therapeutic activity, [id number]- Neuromuscular re-education, 97535- Self Care, [id number]-  Manual therapy, [id number]- Gait training, [phone]- Aquatic Therapy, [phone]- Vasopneumatic device, Patient/Family education, Balance training, Stair training, Taping, Joint mobilization, Cryotherapy, and Moist heat  PLAN FOR NEXT SESSION:  continue working on foot/ankle mobility and strengthening within pt tolerance.    Marvina Danner E Santhiago Collingsworth PT 04/21/2024 12:07 PM

## 2024-04-22 DIAGNOSIS — R251 Tremor, unspecified: Secondary | ICD-10-CM | POA: Diagnosis not present

## 2024-04-22 DIAGNOSIS — E66811 Obesity, class 1: Secondary | ICD-10-CM | POA: Diagnosis not present

## 2024-04-22 DIAGNOSIS — E7849 Other hyperlipidemia: Secondary | ICD-10-CM | POA: Diagnosis not present

## 2024-04-22 DIAGNOSIS — Z6831 Body mass index (BMI) 31.0-31.9, adult: Secondary | ICD-10-CM | POA: Diagnosis not present

## 2024-04-22 DIAGNOSIS — R5383 Other fatigue: Secondary | ICD-10-CM | POA: Diagnosis not present

## 2024-04-22 DIAGNOSIS — R52 Pain, unspecified: Secondary | ICD-10-CM | POA: Diagnosis not present

## 2024-04-22 DIAGNOSIS — N1831 Chronic kidney disease, stage 3a: Secondary | ICD-10-CM | POA: Diagnosis not present

## 2024-04-22 DIAGNOSIS — F321 Major depressive disorder, single episode, moderate: Secondary | ICD-10-CM | POA: Diagnosis not present

## 2024-04-22 DIAGNOSIS — L905 Scar conditions and fibrosis of skin: Secondary | ICD-10-CM | POA: Diagnosis not present

## 2024-04-22 DIAGNOSIS — I10 Essential (primary) hypertension: Secondary | ICD-10-CM | POA: Diagnosis not present

## 2024-04-22 DIAGNOSIS — E1165 Type 2 diabetes mellitus with hyperglycemia: Secondary | ICD-10-CM | POA: Diagnosis not present

## 2024-04-22 DIAGNOSIS — S92355D Nondisplaced fracture of fifth metatarsal bone, left foot, subsequent encounter for fracture with routine healing: Secondary | ICD-10-CM | POA: Diagnosis not present

## 2024-04-22 NOTE — Therapy (Incomplete)
 OUTPATIENT PHYSICAL THERAPY TREATMENT     Patient Name: [name] MRN: [id number] DOB:Jul 07, 1941, 83 y.o., female Today's Date: 04/22/2024  END OF SESSION:        Past Medical History:  Diagnosis Date   Allergy    Anal or rectal pain    sometimes   Anemia    hx of during pregnancy   Anxiety    Arthritis    Asthma    hx of   Bradycardia     I KNOW I HAVE BRADYCARDIA ESPECIALLY WHEN I SLEEP    Carotid artery occlusion    Cataract 2021   bilateral eyes   Chronic back pain    Degenerative joint disease    osteo   Depression    Diabetes mellitus without complication (HCC)    DM type II   Diverticulosis 2003   Dysrhythmia    hx of  due to eye drop and also low heart rate 40's per pt. Dr. Court follows   Elevated total protein    Esophageal dysmotility    Fibromyalgia    GERD (gastroesophageal reflux disease)    subsequent Nissen Fundoplication   Glaucoma    Hearing loss    Heart murmur    was told she had a heart murmur   Hemorrhoids    Hiatal hernia 11/08/2009   Hx of adenomatous colonic polyps 07/02/2002   Hypercalcemia    Hyperlipidemia    Hyperlipidemia    Hypertension    Implantable loop recorder present 11/28/2021   Nausea    Osteoporosis    PONV (postoperative nausea and vomiting)    Rectal bleeding    from hemorrhoids.     Sleep apnea    DOES USE CPAP    Stroke (HCC)    Thrombocytopenia (HCC)    Varicose veins of left lower extremity    Past Surgical History:  Procedure Laterality Date   CARDIAC CATHETERIZATION  03/14/1992   Normal cardiac cath. Normal LV function.   CARDIOVASCULAR STRESS TEST  01/22/2011   No scintigraphic evidence of inducible ischemia.   CAROTID DOPPLER  03/31/2007   Bilateral ICAs - no evidence of significant diameter reduction, dissectin, tortuosity, FMD, or any other vascular abnormality.   CATARACT EXTRACTION, BILATERAL     ESOPHAGEAL MANOMETRY N/A 11/14/2015   Procedure: ESOPHAGEAL MANOMETRY (EM);   Surgeon: Gustav Shila GAILS, MD;  Location: [facility];  Service: Endoscopy;  Laterality: N/A;   ESOPHAGEAL MANOMETRY N/A 01/18/2021   Procedure: ESOPHAGEAL MANOMETRY (EM);  Surgeon: Shila Gustav GAILS, MD;  Location: [facility];  Service: Endoscopy;  Laterality: N/A;   ESOPHAGOGASTRODUODENOSCOPY N/A 03/08/2021   Procedure: ESOPHAGOGASTRODUODENOSCOPY (EGD);  Surgeon: Shyrl Linnie KIDD, MD;  Location: [facility] OR;  Service: Thoracic;  Laterality: N/A;   EYE SURGERY     bilateral cataracts; bilateral stents   FOOT BONE EXCISION     GASTRIC RESECTION  2009   GLAUCOMA SURGERY     JOINT REPLACEMENT     right knee Dr. Melodi 06-23-18   KNEE SURGERY Bilateral    NISSEN FUNDOPLICATION  2000   with subsequent takedown in 2009   TOTAL KNEE ARTHROPLASTY Left 06/10/2017   Procedure: LEFT  TOTAL KNEE ARTHROPLASTY;  Surgeon: Melodi Lerner, MD;  Location: [facility];  Service: Orthopedics;  Laterality: Left;  Adductor Block   TOTAL KNEE ARTHROPLASTY Right 06/23/2018   Procedure: RIGHT TOTAL KNEE ARTHROPLASTY;  Surgeon: Melodi Lerner, MD;  Location: [facility];  Service: Orthopedics;  Laterality: Right;   TRANSCAROTID ARTERY REVASCULARIZATION  Right 08/09/2023   Procedure: TRANSCAROTID ARTERY REVASCULARIZATION;  Surgeon: Magda Debby SAILOR, MD;  Location: [facility] OR;  Service: Vascular;  Laterality: Right;   TRANSTHORACIC ECHOCARDIOGRAM  12/21/2010   EF 60%, moderate LVH,    TUBAL LIGATION     XI ROBOTIC ASSISTED HIATAL HERNIA REPAIR N/A 03/08/2021   Procedure: XI ROBOTIC ASSISTED LAPAROSCOPY WITH LYSIS OF ADHESIONS;  Surgeon: Shyrl Linnie KIDD, MD;  Location: [facility] OR;  Service: Thoracic;  Laterality: N/A;   Patient Active Problem List   Diagnosis Date Noted   Closed nondisplaced fracture of fifth metatarsal bone of left foot 08/07/2023   Stroke (HCC) 08/07/2023   Carotid stenosis, asymptomatic, right 08/07/2023   CVA (cerebral vascular accident) (HCC) 08/06/2023   Syncope 10/22/2022   Acute renal failure  superimposed on stage 3a chronic kidney disease (HCC) 05/22/2022   AKI (acute kidney injury) (HCC) 05/21/2022   Hiatal hernia 03/08/2021   Heartburn    Ineffective esophageal motility    Dyspnea on exertion 07/24/2020   Change in bowel habits 08/22/2018   Knee stiff 07/23/2018   Degeneration of lumbar intervertebral disc 02/04/2018   Scoliosis of lumbar spine 02/04/2018   Sinus bradycardia 07/27/2016   Cough    Essential hypertension 11/24/2014   Gonalgia 05/13/2013   OA (osteoarthritis) of knee 05/13/2013   Degenerative joint disease involving multiple joints 01/19/2013   Rheumatoid arthritis (HCC) 01/19/2013   Thyroid  nodule 06/11/2012   Elevated total protein    Cyst of thyroid  10/29/2011   Hemorrhoids, internal 05/28/2011   Acid reflux 02/14/2011   Avitaminosis D 02/13/2011   Glaucoma 11/28/2010   HLD (hyperlipidemia) 12/14/2009   Internal and external bleeding hemorrhoids 12/14/2009   DIVERTICULOSIS, COLON 12/14/2009   Joint pain 12/14/2009   BACK PAIN, CHRONIC 12/14/2009   FIBROMYALGIA 12/14/2009   OSTEOPOROSIS 12/14/2009   Sleep apnea 12/14/2009   History of colonic polyps 12/14/2009   GASTRIC POLYP, HX OF 12/14/2009   Diaphragmatic hernia 10/04/2009   Depression 09/29/2009   REFLUX ESOPHAGITIS 09/29/2009   GERD 09/29/2009   Constipation 09/29/2009   DYSPHAGIA 09/29/2009    PCP: Roanna Ezekiel [name], MD  REFERRING PROVIDER: Joya Stabs, DPM  REFERRING DIAG: 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  Rationale for Evaluation and Treatment: Rehabilitation  ONSET DATE: 07/29/2024  SUBJECTIVE:   SUBJECTIVE STATEMENT:  04/22/2024: ***  *** Pt report that her foot pain is ~3/10 today.  She feels her energy is improving somewhat and she is able to do more.     EVAL: Patient presents to PT with history of slow healing 5th metatarsal fracture after  a fall about 6 months ago. She states that she doesn't have a clear memory of the fall. The doctors thought that I had a stroke. I went to lock the door and the next thing that I remember is that I was on the floor and partially on the end table.  She states that she is still using the bone stimulator as instructed by referring provider. She is now able to wean from CAM boot. She states that she started walking to the bathroom without the boot at home. Today is her first day ambulating outside of the house without the CAM boot. She additionally reports hip and back pain over the past several months while ambulating with CAM boot and rollator.      PERTINENT HISTORY: Relevant PMHx includes anxiety, arthritis, bradycardia, carotid  artery occlusion, depression, DM type II, fibromyalgia, glaucoma, hearing loss, HTN, CVA, carotid stenosis, scoliosis of lumbar spine   PAIN:  Are you having pain?  Yes: NPRS scale: 2-3/10 current, 10/10 (occasionally, nerve pain) Pain location: L foot (lateral aspect), hips, lower back   Pain description: dull ache  Aggravating factors: weight bearing Relieving factors: sitting, laying down  PRECAUTIONS: Fall  RED FLAGS: None   WEIGHT BEARING RESTRICTIONS: Yes WBAT, wean/discontinue CAM boot and transition to regular shoes  FALLS:  Has patient fallen in last 6 months? Yes. Number of falls 1, see MOI   LIVING ENVIRONMENT: Lives with: lives with their spouse Lives in: House/apartment Stairs: Yes: two external steps with railing  Has following equipment at home: Single point cane and Walker - 4 wheeled for community ambulation   OCCUPATION: n/a   PLOF: Independent return to pain-free household mobility, ambulation, household chores   PATIENT GOALS: To return to ambulating with regular shoes and cane; address pain in foot, hip/back  NEXT MD VISIT: 03/30/2024  OBJECTIVE:  Note: Objective measures were completed at Evaluation unless otherwise  noted.  DIAGNOSTIC FINDINGS:   Per referring provider office note (01/27/2024): Xrays reviewed. Acute fracture noted to proximal fifth metatarsal noted through lateral half of bone just distal to tarsometatarsal joint. Fracture line appears to be nearly completely healed.   PATIENT SURVEYS:  LEFS 22/80  COGNITION: Overall cognitive status: Within functional limits for tasks assessed     SENSATION: Not tested  PALPATION: Moderate tenderness to palpation and proximal end of 5th metatarsal  LUMBAR ROM:   Active  A/PROM  eval  Flexion 80%  Extension 30%, back and hip pain reported  Right lateral flexion 75%  Left lateral flexion 75%  Right rotation 30%, stiffness  Left rotation 40%, stiffness   (Blank rows = not tested)   LOWER EXTREMITY ROM:  Active ROM Right eval Left eval Left 03/12/24 Left 04/01/24  Hip flexion      Hip extension      Hip abduction      Hip adduction      Hip internal rotation      Hip external rotation      Knee flexion      Knee extension      Ankle dorsiflexion 10 -5 A: lacking 2 deg  P: neutral 5 deg   Ankle plantarflexion 35 28  30 deg  Ankle inversion 16 14    Ankle eversion 15 3     (Blank rows = not tested)  LOWER EXTREMITY MMT:   MMT Right eval Left eval R/L 6/10  Hip flexion   4/3+  Hip extension     Hip abduction   3+/3+  Hip adduction     Hip internal rotation     Hip external rotation     Knee flexion   4*/4  Knee extension   4/4  Ankle dorsiflexion     Ankle plantarflexion     Ankle inversion     Ankle eversion      (Blank rows = not tested)   FUNCTIONAL TESTS:  5 times sit to stand: 16 seconds with UE, reports burning in foot.   03/12/24: 5xSTS 11sec w UE support, no foot pain  GAIT: Distance walked: 30 ft from lobby to evaluation area Assistive device utilized: Walker - 4 wheeled Level of assistance: Complete Independence Comments: antalgic gait pattern with decreased L stance time  TREATMENT DATE:   [facility] Adult PT Treatment:                                                DATE: 04/23/24 Therapeutic Exercise: *** Manual Therapy: *** Neuromuscular re-ed: *** Therapeutic Activity: *** Modalities: *** Self Care: ***    [facility] Adult PT Treatment:                                                DATE: 04/21/24 Therapeutic Exercise: LAQ 5# 2x12  Therapeutic Activity: Fwd/retro waling in // with light UE support Lateral walking in // with light UE support Walking 185' with SPC and CGA with 1 rest break Mini squat // bars 2x6 cues to relax UT, breath control, hip mechanics Unilateral cone step overs with L UE support in // Pacing of activities, therapeutic rest breaks   PATIENT EDUCATION:  Education details: rationale for interventions, HEP  Person educated: Patient Education method: Explanation, Demonstration, Tactile cues, Verbal cues Education comprehension: verbalized understanding, returned demonstration, verbal cues required, tactile cues required, and needs further education     HOME EXERCISE PROGRAM:  Access Code: [email or web address] URL: [email or web address] Date: 03/19/2024 Prepared by: Marko Molt  Program Notes Remember to look for a Sock Aid to help with putting on your compression socks.   Exercises - Ankle Inversion Eversion Towel Slide  - 1 x daily - 3 x weekly - 2 sets - 10 reps - Seated Toe Curl  - 1 x daily - 3 x weekly - 2 sets - 10 reps - Toe Spreading  - 1 x daily - 3 x weekly - 2 sets - 10 reps - Seated Hip Abduction with Resistance  - 1 x daily - 3 x weekly - 3 sets - 10 reps - Seated Hip Adduction Isometrics with Ball  - 1 x daily - 3 x weekly - 1 sets - 10 reps - 10 hold - Seated Heel Raise  - 1 x daily - 3 x weekly - 2 sets - 10 reps - Seated Ankle Eversion with Resistance  - 1 x daily - 3 x weekly - 2 sets - 10  reps - Seated Ankle Plantar Flexion with Resistance Loop  - 1 x daily - 3 x weekly - 2 sets - 10 reps - Seated Ankle Dorsiflexion with Resistance  - 1 x daily - 3 x weekly - 2 sets - 10 reps - Seated Ankle Inversion with Anchored Resistance  - 1 x daily - 3 x weekly - 2 sets - 10 reps  ASSESSMENT:  CLINICAL IMPRESSION:  04/22/2024: ***  *** Pt arrives with lower foot pain today which she rates as 3/10.  Concentrated on standing strength and endurance today.  Pt is most limited by low back pain with longer duration standing activities which improves with rest.  Did well with SPC with CGA but did report transient increase in R shoulder pain with use of SPC.   EVAL: [name] is a 83 y.o. female who was seen today for physical therapy evaluation and treatment for Left Foot Pain with mobility deficits, related to slow healing (>6 month) 5th metatarsal fracture. She is demonstrating decreased L ankle AROM, decreased L ankle MMT, altered gait mechanics,  and decreased STS time. She has developed BIL hip and low back pain that is likely related to extended time ambulating with CAM boot, which she is currently weaning from. She has related pain and difficulty with walking, ADLs/IADLs. She also remains at risk for falls, and requires patient education and PT intervention to decrease risk of falls. She requires skilled PT services at this time to address relevant deficits and improve overall function.     OBJECTIVE IMPAIRMENTS: Abnormal gait, decreased activity tolerance, decreased balance, decreased mobility, difficulty walking, decreased ROM, decreased strength, increased edema, and pain.   ACTIVITY LIMITATIONS: standing, squatting, and stairs  PARTICIPATION LIMITATIONS: meal prep, cleaning, laundry, shopping, and community activity  PERSONAL FACTORS: Age, Time since onset of injury/illness/exacerbation, and 3+ comorbidities: Relevant PMHx includes anxiety, arthritis, bradycardia, carotid artery occlusion,  depression, DM type II, fibromyalgia, glaucoma, hearing loss, HTN, CVA, carotid stenosis, scoliosis of lumbar spine are also affecting patient's functional outcome.   REHAB POTENTIAL: Fair    CLINICAL DECISION MAKING: Evolving/moderate complexity  EVALUATION COMPLEXITY: Moderate   GOALS: Goals reviewed with patient? YES  SHORT TERM GOALS: Target date: 03/05/2024  Patient will be independent with initial home program at least 3 days/week.  Baseline: provided at eval 03/12/24: reports good HEP adherence Goal Status: met   2.  Patient will demonstrate complete 5 time Sit-to-Stand test in 14 seconds or less in order to demonstrate decreasing risk of falls.  Baseline: see objective measures 03/12/24: 11 sec Goal Status: met  3.  Patient will demonstrate improved L ankle DF AROM to at least neutral (0 degrees).  Baseline: lacking 5 degrees of DF 03/12/24: lacking 2 actively 04/23/24: ***  Goal status: ***   LONG TERM GOALS: Target date: 04/02/2024 extended to 05/19/2024   Patient will report improved overall functional ability with LEFS score of 60/80 or greater.  Baseline: 22/80 6/10: 27/80 04/23/24: ***  Goal status: ***  2.  Patient will return to safe ambulation with [facility] for household and community ambulation.  Baseline: ambulating with antalgic gait with rollator  6/10: feeling safe with walker with reduced antalgic gait, not using cane 04/23/24: ***  Goal status: ***  3.  Patient will demonstrate at least 4+/5 MMT with LQ testing in L LE. Baseline: see objective measures  6/10: ongoing 04/23/24: ***  Goal status: ***  4.  Patient will report ability to perform household chores and ADLs with no greater than 6/89 foot pain.  Baseline: up to 10/10 pain; moderate-to-severe difficulty perform activities around her home  6/10: 5/10 04/23/24: ***  Goal status: ***     PLAN:  PT FREQUENCY: 1-2x/week  PT DURATION: 8 weeks  PLANNED INTERVENTIONS: [id number]- PT Re-evaluation,  97750- Physical Performance Testing, 97110-Therapeutic exercises, 97530- Therapeutic activity, [id number]- Neuromuscular re-education, 97535- Self Care, [id number]- Manual therapy, [id number]- Gait training, [phone]- Aquatic Therapy, [phone]- Vasopneumatic device, Patient/Family education, Balance training, Stair training, Taping, Joint mobilization, Cryotherapy, and Moist heat  PLAN FOR NEXT SESSION:  continue working on foot/ankle mobility and strengthening within pt tolerance.    Alm DELENA Jenny PT, DPT 04/22/2024 8:10 AM

## 2024-04-23 ENCOUNTER — Ambulatory Visit (INDEPENDENT_AMBULATORY_CARE_PROVIDER_SITE_OTHER)

## 2024-04-23 ENCOUNTER — Ambulatory Visit: Admitting: Physical Therapy

## 2024-04-23 ENCOUNTER — Encounter: Payer: Self-pay | Admitting: Podiatry

## 2024-04-23 ENCOUNTER — Ambulatory Visit (INDEPENDENT_AMBULATORY_CARE_PROVIDER_SITE_OTHER): Admitting: Podiatry

## 2024-04-23 DIAGNOSIS — M76821 Posterior tibial tendinitis, right leg: Secondary | ICD-10-CM | POA: Diagnosis not present

## 2024-04-23 DIAGNOSIS — G5751 Tarsal tunnel syndrome, right lower limb: Secondary | ICD-10-CM | POA: Diagnosis not present

## 2024-04-23 DIAGNOSIS — S9031XA Contusion of right foot, initial encounter: Secondary | ICD-10-CM | POA: Diagnosis not present

## 2024-04-23 MED ORDER — TRIAMCINOLONE ACETONIDE 10 MG/ML IJ SUSP
10.0000 mg | Freq: Once | INTRAMUSCULAR | Status: AC
Start: 2024-04-23 — End: 2024-04-23
  Administered 2024-04-23: 10 mg via INTRAMUSCULAR

## 2024-04-23 NOTE — Progress Notes (Signed)
 Chief Complaint  Patient presents with   Foot Pain    Medial foot right - severely aching since yesterday, no injury just started to hurt, couldn't bear weight yesterday, radiates up medial ankle and leg, history of fx    HPI: 83 y.o. female presents today with new onset right medial midfoot pain.  Started yesterday and reports significant soreness, aching, throbbing pain.  Is difficult for her to put weight on the area.  She reports pain rating up the medial ankle and leg.  She does ambulate with a walker.  She previously had been dealing with left fifth met fracture which had been slow to heal.  Past Medical History:  Diagnosis Date   Allergy    Anal or rectal pain    sometimes   Anemia    hx of during pregnancy   Anxiety    Arthritis    Asthma    hx of   Bradycardia     I KNOW I HAVE BRADYCARDIA ESPECIALLY WHEN I SLEEP    Carotid artery occlusion    Cataract 2021   bilateral eyes   Chronic back pain    Degenerative joint disease    osteo   Depression    Diabetes mellitus without complication (HCC)    DM type II   Diverticulosis 2003   Dysrhythmia    hx of  due to eye drop and also low heart rate 40's per pt. Dr. Court follows   Elevated total protein    Esophageal dysmotility    Fibromyalgia    GERD (gastroesophageal reflux disease)    subsequent Nissen Fundoplication   Glaucoma    Hearing loss    Heart murmur    was told she had a heart murmur   Hemorrhoids    Hiatal hernia 11/08/2009   Hx of adenomatous colonic polyps 07/02/2002   Hypercalcemia    Hyperlipidemia    Hyperlipidemia    Hypertension    Implantable loop recorder present 11/28/2021   Nausea    Osteoporosis    PONV (postoperative nausea and vomiting)    Rectal bleeding    from hemorrhoids.     Sleep apnea    DOES USE CPAP    Stroke (HCC)    Thrombocytopenia (HCC)    Varicose veins of left lower extremity     Past Surgical History:  Procedure Laterality Date   CARDIAC  CATHETERIZATION  03/14/1992   Normal cardiac cath. Normal LV function.   CARDIOVASCULAR STRESS TEST  01/22/2011   No scintigraphic evidence of inducible ischemia.   CAROTID DOPPLER  03/31/2007   Bilateral ICAs - no evidence of significant diameter reduction, dissectin, tortuosity, FMD, or any other vascular abnormality.   CATARACT EXTRACTION, BILATERAL     ESOPHAGEAL MANOMETRY N/A 11/14/2015   Procedure: ESOPHAGEAL MANOMETRY (EM);  Surgeon: Gustav Shila GAILS, MD;  Location: [facility];  Service: Endoscopy;  Laterality: N/A;   ESOPHAGEAL MANOMETRY N/A 01/18/2021   Procedure: ESOPHAGEAL MANOMETRY (EM);  Surgeon: Shila Gustav GAILS, MD;  Location: [facility];  Service: Endoscopy;  Laterality: N/A;   ESOPHAGOGASTRODUODENOSCOPY N/A 03/08/2021   Procedure: ESOPHAGOGASTRODUODENOSCOPY (EGD);  Surgeon: Shyrl Linnie KIDD, MD;  Location: [facility] OR;  Service: Thoracic;  Laterality: N/A;   EYE SURGERY     bilateral cataracts; bilateral stents   FOOT BONE EXCISION     GASTRIC RESECTION  2009   GLAUCOMA SURGERY     JOINT REPLACEMENT     right knee Dr. Melodi 06-23-18  KNEE SURGERY Bilateral    NISSEN FUNDOPLICATION  2000   with subsequent takedown in 2009   TOTAL KNEE ARTHROPLASTY Left 06/10/2017   Procedure: LEFT  TOTAL KNEE ARTHROPLASTY;  Surgeon: Melodi Lerner, MD;  Location: [facility];  Service: Orthopedics;  Laterality: Left;  Adductor Block   TOTAL KNEE ARTHROPLASTY Right 06/23/2018   Procedure: RIGHT TOTAL KNEE ARTHROPLASTY;  Surgeon: Melodi Lerner, MD;  Location: [facility];  Service: Orthopedics;  Laterality: Right;   TRANSCAROTID ARTERY REVASCULARIZATION  Right 08/09/2023   Procedure: TRANSCAROTID ARTERY REVASCULARIZATION;  Surgeon: Magda Debby SAILOR, MD;  Location: [facility] OR;  Service: Vascular;  Laterality: Right;   TRANSTHORACIC ECHOCARDIOGRAM  12/21/2010   EF 60%, moderate LVH,    TUBAL LIGATION     XI ROBOTIC ASSISTED HIATAL HERNIA REPAIR N/A 03/08/2021   Procedure: XI ROBOTIC ASSISTED  LAPAROSCOPY WITH LYSIS OF ADHESIONS;  Surgeon: Shyrl Linnie KIDD, MD;  Location: [facility] OR;  Service: Thoracic;  Laterality: N/A;    Allergies  Allergen Reactions   Adalat [Nifedipine] Other (See Comments)    Tired, Made pt feel crazy in the head   Hydrocodone  Itching and Nausea And Vomiting   Oxycodone -Acetaminophen  Itching   Meperidine Hcl Other (See Comments)    Demerol causes hallucinations   Naproxen Nausea And Vomiting    ROS    Physical Exam: There were no vitals filed for this visit.  General: The patient is alert and oriented x3 in no acute distress.  Dermatology: No open wounds noted to skin of bilateral feet.  Interspaces are clear of maceration and debris.    Vascular: Faintly palpable DP and PT pedal pulses bilaterally. Capillary refill within normal limits.  No appreciable edema.  Decreased pedal hair  Neurological: Light touch sensation grossly intact bilateral feet.  Positive Tinel's sign right tibial nerve  Musculoskeletal Exam: Significant pes planus deformity noted bilaterally.  Pain on palpation of right PT tendon along medial ankle and along insertion of navicular tuberosity.  Point of maximum tenderness right medial ankle and medial midfoot is at the level of the PT tendon insertion.  Radiographic Exam: Right foot 3 views weightbearing Diffuse osteopenia noted.  Pes planus foot type noted with decreased calcaneal inclination noted.  Joint space narrowing of ankle joint noted.  No significant talar head uncoverage noted however there is enlarged navicular tuberosity which does appear medialized as well.  Assessment/Plan of Care: 1. Posterior tibial tendon dysfunction (PTTD) of right lower extremity   2. Tarsal tunnel syndrome of right side      Meds ordered this encounter  Medications   triamcinolone  acetonide (KENALOG ) 10 MG/ML injection 10 mg   None  Discussed clinical findings with patient today.  Radiographs reviewed with patient  Discussed  findings consistent with PTTD with collapsing flatfoot deformity versus right sided tarsal tunnel symptoms of right foot.  Discussed use of good supportive shoe gear, use of functional ankle brace, as well as custom bracing or custom inserts.  She does have diabetic shoes however these do not appear very supportive of the ankle.  Recommend use of RICE therapy.  Verbal consent was obtained to administer corticosteroid injection to right PT tendon at level of its insertion.  Alcohol  skin prep.  Injected 0.5 mL of 0.5% Marcaine  mixed with 1 mg Kenalog  10.  Band-Aid applied.  Patient tolerated this well.  ASO ankle brace was fitted and dispensed for the patient today.  Stretching and rehabilitation exercises for PT tendinitis and tarsal tunnel syndrome were discussed and dispensed  to the patient today.  Reevaluate in approximately 2-3 weeks.  Has previously seen Dr. Sikora   Allah Reason L. Lamount MAUL, AACFAS [facility]     [address] [place], [place] [address]                Office [phone]  Fax [phone]

## 2024-05-04 ENCOUNTER — Ambulatory Visit

## 2024-05-04 DIAGNOSIS — R55 Syncope and collapse: Secondary | ICD-10-CM

## 2024-05-05 LAB — CUP PACEART REMOTE DEVICE CHECK
Date Time Interrogation Session: 20250720233543
Implantable Pulse Generator Implant Date: 20240222

## 2024-05-06 ENCOUNTER — Ambulatory Visit: Payer: Medicare Other | Admitting: Neurology

## 2024-05-13 ENCOUNTER — Ambulatory Visit: Admitting: Cardiology

## 2024-05-13 ENCOUNTER — Ambulatory Visit: Payer: Self-pay | Admitting: Cardiology

## 2024-05-18 ENCOUNTER — Ambulatory Visit (INDEPENDENT_AMBULATORY_CARE_PROVIDER_SITE_OTHER): Admitting: Podiatry

## 2024-05-18 ENCOUNTER — Encounter: Payer: Self-pay | Admitting: Podiatry

## 2024-05-18 DIAGNOSIS — G5751 Tarsal tunnel syndrome, right lower limb: Secondary | ICD-10-CM

## 2024-05-18 DIAGNOSIS — M76821 Posterior tibial tendinitis, right leg: Secondary | ICD-10-CM

## 2024-05-18 NOTE — Patient Instructions (Signed)
Posterior Tibial Tendinitis Rehab Ask your health care provider which exercises are safe for you. Do exercises exactly as told by your provider and adjust them as directed. It's normal to feel mild stretching, pulling, tightness, or discomfort as you do these exercises. Stop right away if you feel sudden pain or your pain gets worse. Do not begin these exercises until told by your provider. Stretching and range-of-motion exercises These exercises warm up your muscles and joints and improve the movement and flexibility in your ankle and foot. These exercises may also help to relieve pain. Standing wall calf stretch, knee straight  Stand with your hands against a wall. Extend your left / right leg behind you, and bend your front knee slightly. If told, place a folded washcloth under the arch of your foot for support. Point the toes of your back foot slightly inward. Keeping your heels on the floor and your back knee straight, shift your weight toward the wall. Do not allow your back to arch. You should feel a gentle stretch in your upper left / right calf. Hold this position for __________ seconds. Repeat __________ times. Complete this exercise __________ times a day. Standing wall calf stretch, knee bent  Stand with your hands against a wall. Extend your left / right leg behind you, and bend your front knee slightly. If told, place a folded washcloth under the arch of your foot for support. Point the toes of your back foot slightly inward. Unlock your back knee so it's bent. Keep your heels on the floor. You should feel a gentle stretch deep in your lower left / right calf. Hold this position for __________ seconds. Repeat __________ times. Complete this exercise __________ times a day. Strengthening exercises These exercises build strength and endurance in your ankle and foot. Endurance is the ability to use your muscles for a long time, even after they get tired. Ankle inversion with  band  Secure one end of a rubber exercise band or tubing to a fixed object, such as a table leg or a pole, that will stay still when the band is pulled. Loop the other end of the band around the middle of your left / right foot. Sit on the floor facing the object with your left / right leg extended. The band or tube should be slightly tense when your foot is relaxed. Leading with your big toe, slowly bring your left / right foot and ankle inward, toward your other foot (inversion). Hold this position for __________ seconds. Slowly return your foot to the starting position. Repeat __________ times. Complete this exercise __________ times a day. Towel curls  Sit in a chair on a non-carpeted surface, and put your feet on the floor. Place a towel in front of your feet. If told by your provider, add a __________ weight to the end of the towel. Keeping your heel on the floor, put your left / right foot on the towel. Pull the towel toward you by grabbing the towel with your toes and curling them under. Keep your heel on the floor while you do this. Let your toes relax. Grab the towel with your toes again. Keep going until the towel is completely underneath your foot. Repeat __________ times. Complete this exercise __________ times a day. Balance exercise This exercise improves or maintains your balance. Balance is important in preventing falls. Single leg stand  Without wearing shoes, stand near a railing or in a doorway. You may hold on to the railing or   doorframe as needed for balance. Stand on your left / right foot. Keep your big toe down on the floor and try to keep your arch lifted. If balancing in this position is too easy, try the exercise with your eyes closed or while standing on a pillow. Hold this position for __________ seconds. Repeat __________ times. Complete this exercise __________ times a day. This information is not intended to replace advice given to you by your health care  provider. Make sure you discuss any questions you have with your health care provider. Document Revised: 10/03/2022 Document Reviewed: 10/03/2022 [employer] Patient Education  2024 [employer]  

## 2024-05-18 NOTE — Progress Notes (Signed)
 Chief Complaint  Patient presents with   Tendonitis    It hurts some but it's better than it was when I was here the last time.    HPI: 83 y.o. female presents today for follow-up of right PTTD. She was seen by Dr. Lamount a few weeks ago and given injection and a brace to help. Relates pain has improved. Denies any other pedal complaints  Past Medical History:  Diagnosis Date   Allergy    Anal or rectal pain    sometimes   Anemia    hx of during pregnancy   Anxiety    Arthritis    Asthma    hx of   Bradycardia     I KNOW I HAVE BRADYCARDIA ESPECIALLY WHEN I SLEEP    Carotid artery occlusion    Cataract 2021   bilateral eyes   Chronic back pain    Degenerative joint disease    osteo   Depression    Diabetes mellitus without complication (HCC)    DM type II   Diverticulosis 2003   Dysrhythmia    hx of  due to eye drop and also low heart rate 40's per pt. Dr. Court follows   Elevated total protein    Esophageal dysmotility    Fibromyalgia    GERD (gastroesophageal reflux disease)    subsequent Nissen Fundoplication   Glaucoma    Hearing loss    Heart murmur    was told she had a heart murmur   Hemorrhoids    Hiatal hernia 11/08/2009   Hx of adenomatous colonic polyps 07/02/2002   Hypercalcemia    Hyperlipidemia    Hyperlipidemia    Hypertension    Implantable loop recorder present 11/28/2021   Nausea    Osteoporosis    PONV (postoperative nausea and vomiting)    Rectal bleeding    from hemorrhoids.     Sleep apnea    DOES USE CPAP    Stroke (HCC)    Thrombocytopenia (HCC)    Varicose veins of left lower extremity     Past Surgical History:  Procedure Laterality Date   CARDIAC CATHETERIZATION  03/14/1992   Normal cardiac cath. Normal LV function.   CARDIOVASCULAR STRESS TEST  01/22/2011   No scintigraphic evidence of inducible ischemia.   CAROTID DOPPLER  03/31/2007   Bilateral ICAs - no evidence of significant diameter reduction,  dissectin, tortuosity, FMD, or any other vascular abnormality.   CATARACT EXTRACTION, BILATERAL     ESOPHAGEAL MANOMETRY N/A 11/14/2015   Procedure: ESOPHAGEAL MANOMETRY (EM);  Surgeon: Gustav Shila GAILS, MD;  Location: [facility];  Service: Endoscopy;  Laterality: N/A;   ESOPHAGEAL MANOMETRY N/A 01/18/2021   Procedure: ESOPHAGEAL MANOMETRY (EM);  Surgeon: Shila Gustav GAILS, MD;  Location: [facility];  Service: Endoscopy;  Laterality: N/A;   ESOPHAGOGASTRODUODENOSCOPY N/A 03/08/2021   Procedure: ESOPHAGOGASTRODUODENOSCOPY (EGD);  Surgeon: Shyrl Linnie KIDD, MD;  Location: [facility];  Service: Thoracic;  Laterality: N/A;   EYE SURGERY     bilateral cataracts; bilateral stents   FOOT BONE EXCISION     GASTRIC RESECTION  2009   GLAUCOMA SURGERY     JOINT REPLACEMENT     right knee Dr. Melodi 06-23-18   KNEE SURGERY Bilateral    NISSEN FUNDOPLICATION  2000   with subsequent takedown in 2009   TOTAL KNEE ARTHROPLASTY Left 06/10/2017   Procedure: LEFT  TOTAL KNEE ARTHROPLASTY;  Surgeon: Melodi Lerner, MD;  Location: [facility];  Service: Orthopedics;  Laterality: Left;  Adductor Block   TOTAL KNEE ARTHROPLASTY Right 06/23/2018   Procedure: RIGHT TOTAL KNEE ARTHROPLASTY;  Surgeon: Melodi Lerner, MD;  Location: [facility];  Service: Orthopedics;  Laterality: Right;   TRANSCAROTID ARTERY REVASCULARIZATION  Right 08/09/2023   Procedure: TRANSCAROTID ARTERY REVASCULARIZATION;  Surgeon: Magda Debby SAILOR, MD;  Location: [facility];  Service: Vascular;  Laterality: Right;   TRANSTHORACIC ECHOCARDIOGRAM  12/21/2010   EF 60%, moderate LVH,    TUBAL LIGATION     XI ROBOTIC ASSISTED HIATAL HERNIA REPAIR N/A 03/08/2021   Procedure: XI ROBOTIC ASSISTED LAPAROSCOPY WITH LYSIS OF ADHESIONS;  Surgeon: Shyrl Linnie KIDD, MD;  Location: [facility];  Service: Thoracic;  Laterality: N/A;    Allergies  Allergen Reactions   Adalat [Nifedipine] Other (See Comments)    Tired, Made pt feel crazy in the head    Hydrocodone  Itching and Nausea And Vomiting   Oxycodone -Acetaminophen  Itching   Meperidine Hcl Other (See Comments)    Demerol causes hallucinations   Naproxen Nausea And Vomiting    ROS    Physical Exam: There were no vitals filed for this visit.  General: The patient is alert and oriented x3 in no acute distress.  Dermatology: No open wounds noted to skin of bilateral feet.  Interspaces are clear of maceration and debris.    Vascular: Faintly palpable DP and PT pedal pulses bilaterally. Capillary refill within normal limits.  No appreciable edema.  Decreased pedal hair  Neurological: Light touch sensation grossly intact bilateral feet.  Positive Tinel's sign right tibial nerve  Musculoskeletal Exam: Significant pes planus deformity noted bilaterally.  Pain on palpation of right PT tendon along medial ankle and along insertion of navicular tuberosity.  Point of maximum tenderness right medial ankle and medial midfoot is at the level of the PT tendon insertion.  Radiographic Exam: Right foot 3 views weightbearing Diffuse osteopenia noted.  Pes planus foot type noted with decreased calcaneal inclination noted.  Joint space narrowing of ankle joint noted.  No significant talar head uncoverage noted however there is enlarged navicular tuberosity which does appear medialized as well.  Assessment/Plan of Care: 1. Posterior tibial tendon dysfunction (PTTD) of right lower extremity   2. Tarsal tunnel syndrome of right side       No orders of the defined types were placed in this encounter.  None  X-rays reviewed and discussed with patient. Discussed PTTD diagnosis and treatment options with patient. Continue brace and exercises.  Discussed if there is no improvement PT/MRI/injection may be an option. Patient to return to clinic iin 1 month for rfc.

## 2024-06-01 ENCOUNTER — Encounter: Payer: Self-pay | Admitting: *Deleted

## 2024-06-02 ENCOUNTER — Encounter: Payer: Self-pay | Admitting: Cardiology

## 2024-06-02 ENCOUNTER — Ambulatory Visit: Attending: Cardiology | Admitting: Cardiology

## 2024-06-02 VITALS — BP 128/68 | HR 64 | Ht 70.5 in | Wt 211.8 lb

## 2024-06-02 DIAGNOSIS — I10 Essential (primary) hypertension: Secondary | ICD-10-CM | POA: Insufficient documentation

## 2024-06-02 DIAGNOSIS — Z8673 Personal history of transient ischemic attack (TIA), and cerebral infarction without residual deficits: Secondary | ICD-10-CM | POA: Insufficient documentation

## 2024-06-02 DIAGNOSIS — E782 Mixed hyperlipidemia: Secondary | ICD-10-CM | POA: Diagnosis not present

## 2024-06-02 NOTE — Patient Instructions (Signed)
 Medication Instructions:  Your physician recommends that you continue on your current medications as directed. Please refer to the Current Medication list given to you today.  *If you need a refill on your cardiac medications before your next appointment, please call your pharmacy*  Follow-Up: At [facility], you and your health needs are our priority.  As part of our continuing mission to provide you with exceptional heart care, our providers are all part of one team.  This team includes your primary Cardiologist (physician) and Advanced Practice Providers or APPs (Physician Assistants and Nurse Practitioners) who all work together to provide you with the care you need, when you need it.  Your next appointment:   6 month(s)  Provider:   Thomasene Ripple, DO

## 2024-06-04 ENCOUNTER — Ambulatory Visit: Payer: Self-pay | Admitting: Cardiology

## 2024-06-04 ENCOUNTER — Ambulatory Visit (INDEPENDENT_AMBULATORY_CARE_PROVIDER_SITE_OTHER)

## 2024-06-04 DIAGNOSIS — R55 Syncope and collapse: Secondary | ICD-10-CM

## 2024-06-04 LAB — CUP PACEART REMOTE DEVICE CHECK
Date Time Interrogation Session: 20250820234232
Implantable Pulse Generator Implant Date: 20240222

## 2024-06-04 NOTE — Progress Notes (Signed)
 [facility] Summary Report / Loop Recorder

## 2024-06-05 NOTE — Progress Notes (Signed)
 Cardiology Office Note:    Date:  06/05/2024   ID:  [name], DOB 09/11/1941, MRN [id number]  PCP:  Roanna Ezekiel [name], MD  Cardiologist:  Dub Huntsman, DO  Electrophysiologist:  Will Gladis Norton, MD   Referring MD: Roanna Ezekiel [name], MD    I am having shortness of breath   History of Present Illness:    [name] is a 83 y.o. female with a hx of hypertension, occasional PVC , asthma, anxiety, GERD, history of CVA, diabetes mellitus, fibromyalgia here today for follow-up visit.  Since her last visit with me she has been doing well from a cardiovascular standpoint.  No hospitalizations since I last saw her.  But shares She experiences dyspnea on exertion, becoming breathless with movement and pacing her activities by doing things gradually. She is gradually returning to her normal activities. She manages leg swelling with support socks.  Discussed the use of AI scribe software for clinical note transcription with the patient, who gave verbal consent to proceed.         Past Medical History:  Diagnosis Date   Allergy    Anal or rectal pain    sometimes   Anemia    hx of during pregnancy   Anxiety    Arthritis    Asthma    hx of   Bradycardia     I KNOW I HAVE BRADYCARDIA ESPECIALLY WHEN I SLEEP    Carotid artery occlusion    Cataract 2021   bilateral eyes   Chronic back pain    Degenerative joint disease    osteo   Depression    Diabetes mellitus without complication (HCC)    DM type II   Diverticulosis 2003   Dysrhythmia    hx of  due to eye drop and also low heart rate 40's per pt. Dr. Court follows   Elevated total protein    Esophageal dysmotility    Fibromyalgia    GERD (gastroesophageal reflux disease)    subsequent Nissen Fundoplication   Glaucoma    Hearing loss    Heart murmur    was told she had a heart murmur   Hemorrhoids    Hiatal hernia 11/08/2009   Hx of adenomatous colonic polyps 07/02/2002   Hypercalcemia    Hyperlipidemia     Hyperlipidemia    Hypertension    Implantable loop recorder present 11/28/2021   Nausea    Osteoporosis    PONV (postoperative nausea and vomiting)    Rectal bleeding    from hemorrhoids.     Sleep apnea    DOES USE CPAP    Stroke (HCC)    Thrombocytopenia (HCC)    Varicose veins of left lower extremity     Past Surgical History:  Procedure Laterality Date   CARDIAC CATHETERIZATION  03/14/1992   Normal cardiac cath. Normal LV function.   CARDIOVASCULAR STRESS TEST  01/22/2011   No scintigraphic evidence of inducible ischemia.   CAROTID DOPPLER  03/31/2007   Bilateral ICAs - no evidence of significant diameter reduction, dissectin, tortuosity, FMD, or any other vascular abnormality.   CATARACT EXTRACTION, BILATERAL     ESOPHAGEAL MANOMETRY N/A 11/14/2015   Procedure: ESOPHAGEAL MANOMETRY (EM);  Surgeon: Gustav Shila GAILS, MD;  Location: [facility];  Service: Endoscopy;  Laterality: N/A;   ESOPHAGEAL MANOMETRY N/A 01/18/2021   Procedure: ESOPHAGEAL MANOMETRY (EM);  Surgeon: Shila Gustav GAILS, MD;  Location: [facility];  Service: Endoscopy;  Laterality: N/A;   ESOPHAGOGASTRODUODENOSCOPY N/A  03/08/2021   Procedure: ESOPHAGOGASTRODUODENOSCOPY (EGD);  Surgeon: Shyrl Linnie KIDD, MD;  Location: [facility] OR;  Service: Thoracic;  Laterality: N/A;   EYE SURGERY     bilateral cataracts; bilateral stents   FOOT BONE EXCISION     GASTRIC RESECTION  2009   GLAUCOMA SURGERY     JOINT REPLACEMENT     right knee Dr. Melodi 06-23-18   KNEE SURGERY Bilateral    NISSEN FUNDOPLICATION  2000   with subsequent takedown in 2009   TOTAL KNEE ARTHROPLASTY Left 06/10/2017   Procedure: LEFT  TOTAL KNEE ARTHROPLASTY;  Surgeon: Melodi Lerner, MD;  Location: [facility];  Service: Orthopedics;  Laterality: Left;  Adductor Block   TOTAL KNEE ARTHROPLASTY Right 06/23/2018   Procedure: RIGHT TOTAL KNEE ARTHROPLASTY;  Surgeon: Melodi Lerner, MD;  Location: [facility];  Service: Orthopedics;  Laterality:  Right;   TRANSCAROTID ARTERY REVASCULARIZATION  Right 08/09/2023   Procedure: TRANSCAROTID ARTERY REVASCULARIZATION;  Surgeon: Magda Debby SAILOR, MD;  Location: [facility] OR;  Service: Vascular;  Laterality: Right;   TRANSTHORACIC ECHOCARDIOGRAM  12/21/2010   EF 60%, moderate LVH,    TUBAL LIGATION     XI ROBOTIC ASSISTED HIATAL HERNIA REPAIR N/A 03/08/2021   Procedure: XI ROBOTIC ASSISTED LAPAROSCOPY WITH LYSIS OF ADHESIONS;  Surgeon: Shyrl Linnie KIDD, MD;  Location: [facility] OR;  Service: Thoracic;  Laterality: N/A;    Current Medications: Current Meds  Medication Sig   acetaminophen  (TYLENOL ) 500 MG tablet Take 1,000 mg by mouth 2 (two) times daily as needed for headache (back and knee pain).   albuterol  (VENTOLIN  HFA) 108 (90 Base) MCG/ACT inhaler Inhale 1 puff into the lungs as needed for wheezing or shortness of breath.   amLODipine (NORVASC) 5 MG tablet Take 5 mg by mouth daily.   ascorbic acid  (VITAMIN C ) 500 MG tablet Take 1 tablet (500 mg total) by mouth daily.   brimonidine  (ALPHAGAN ) 0.2 % ophthalmic solution Place 1 drop into both eyes 2 (two) times daily.   cetirizine (ZYRTEC) 10 MG tablet Take 10 mg by mouth at bedtime.   Cholecalciferol  (VITAMIN D3) 250 MCG (10000 UT) capsule Take 10,000 Units by mouth daily.   clopidogrel  (PLAVIX ) 75 MG tablet Take 1 tablet (75 mg total) by mouth daily.   dexlansoprazole  (DEXILANT ) 60 MG capsule Take 1 capsule (60 mg total) by mouth daily.   Evolocumab  (REPATHA  SURECLICK) 140 MG/ML SOAJ Inject 140 mg into the skin every 14 (fourteen) days.   hydrocortisone  (ANUSOL -HC) 2.5 % rectal cream Place 1 Application rectally 2 (two) times daily.   hydrocortisone  (ANUSOL -HC) 25 MG suppository Place 1 suppository (25 mg total) rectally at bedtime.   indomethacin  (INDOCIN ) 50 MG capsule PLEASE SEE ATTACHED FOR DETAILED DIRECTIONS   ketoconazole  (NIZORAL ) 2 % cream Apply 1 Application topically daily.   latanoprost  (XALATAN ) 0.005 % ophthalmic solution Place 1  drop into the right eye at bedtime.   linaclotide  (LINZESS ) 290 MCG CAPS capsule Take 1 capsule (290 mcg total) by mouth daily before breakfast.   metFORMIN (GLUCOPHAGE) 500 MG tablet Take 500 mg by mouth every morning.   montelukast  (SINGULAIR ) 10 MG tablet Take 10 mg by mouth at bedtime.   nystatin ointment (MYCOSTATIN) Apply 1 application  topically See admin instructions. 1 application 1-2 times a day as needed for rash   OZEMPIC, 0.25 OR 0.5 MG/DOSE, 2 MG/3ML SOPN SMARTSIG:0.25 Milligram(s) SUB-Q Once a Week   Polyethyl Glycol-Propyl Glycol (SYSTANE OP) Place 1 drop into both eyes daily as needed (dry/irritated eyes).  rosuvastatin  (CRESTOR ) 40 MG tablet Take 40 mg by mouth at bedtime.   senna-docusate (SENOKOT-S) 8.6-50 MG tablet Take 2 tablets by mouth 2 (two) times daily.   sertraline  (ZOLOFT ) 100 MG tablet Take 100 mg by mouth every morning.   sucralfate  (CARAFATE ) 1 g tablet Take 1 tablet (1 g total) by mouth 2 (two) times daily as needed. Do not take within 2 hours of any other medications.   telmisartan (MICARDIS) 80 MG tablet Take 80 mg by mouth every morning.   zinc  sulfate 220 (50 Zn) MG capsule Take 1 capsule (220 mg total) by mouth daily.     Allergies:   Adalat [nifedipine], Hydrocodone , Oxycodone -acetaminophen , Meperidine hcl, and Naproxen   Social History   Socioeconomic History   Marital status: Married    Spouse name: Not on file   Number of children: 8   Years of education: Not on file   Highest education level: Not on file  Occupational History   Occupation: retired    Comment: retired Clinical biochemist.   Tobacco Use   Smoking status: Never   Smokeless tobacco: Never  Vaping Use   Vaping status: Never Used  Substance and Sexual Activity   Alcohol  use: No    Alcohol /week: 0.0 standard drinks of alcohol    Drug use: No   Sexual activity: Yes  Other Topics Concern   Not on file  Social History Narrative   Husband, Mervin Bonfiglio is Next of Kin. Cell #  [phone]      Right handed    Wear glasses drinks    Social Drivers of Health   Financial Resource Strain: Not on file  Food Insecurity: No Food Insecurity (08/07/2023)   Hunger Vital Sign    Worried About Running Out of Food in the Last Year: Never true    Ran Out of Food in the Last Year: Never true  Transportation Needs: No Transportation Needs (08/07/2023)   PRAPARE - Administrator, Civil Service (Medical): No    Lack of Transportation (Non-Medical): No  Physical Activity: Not on file  Stress: Not on file  Social Connections: Not on file     Family History: The patient's family history includes Breast cancer in her daughter; Cancer in her maternal grandmother; Diabetes in her father and mother; Heart disease in her mother; Hypertension in her mother; Kidney disease in her father and maternal grandfather; Leukemia in her maternal uncle; Prostate cancer in her maternal uncle; Stomach cancer in her maternal aunt; Stroke in her brother; Uterine cancer in her maternal aunt. There is no history of Colon cancer, Rectal cancer, or Esophageal cancer.  ROS:   Review of Systems  Constitution: Negative for decreased appetite, fever and weight gain.  HENT: Negative for congestion, ear discharge, hoarse voice and sore throat.   Eyes: Negative for discharge, redness, vision loss in right eye and visual halos.  Cardiovascular: Reports dyspnea exertion.  Negative for chest pain, leg swelling, orthopnea and palpitations.  Respiratory: Negative for cough, hemoptysis, shortness of breath and snoring.   Endocrine: Negative for heat intolerance and polyphagia.  Hematologic/Lymphatic: Negative for bleeding problem. Does not bruise/bleed easily.  Skin: Negative for flushing, nail changes, rash and suspicious lesions.  Musculoskeletal: Negative for arthritis, joint pain, muscle cramps, myalgias, neck pain and stiffness.  Gastrointestinal: Negative for abdominal pain, bowel incontinence,  diarrhea and excessive appetite.  Genitourinary: Negative for decreased libido, genital sores and incomplete emptying.  Neurological: Negative for brief paralysis, focal weakness, headaches and loss  of balance.  Psychiatric/Behavioral: Negative for altered mental status, depression and suicidal ideas.  Allergic/Immunologic: Negative for HIV exposure and persistent infections.    EKGs/Labs/Other Studies Reviewed:    The following studies were reviewed today:   EKG:  The ekg ordered today demonstrates sinus rhythm, heart rate 60 bpm  CTA of the chest December 2023  IMPRESSION: 1. No evidence of pulmonary embolism. 2. Suspect mild pulmonary interstitial edema. 3. Borderline enlarged main pulmonary artery suggesting pulmonary hypertension. Additionally, there is cardiomegaly with reflux of contrast into the IVC/hepatic veins suggesting right heart dysfunction. 4. Unchanged hiatal hernia.     Electronically Signed   By: Maude Harry M.D.   On: 10/04/2022 14:03    Coronary CTA February 2023 FINDINGS: Non-cardiac: See separate report from [facility]. No significant findings on limited lung and soft tissue windows.   Calcium  Score: No calcium  noted in coronary arteries   Coronary Arteries: Right dominant with no anomalies   LM: Normal   LAD: Normal   D1: Normal   D2: Normal   Circumflex: Normal   OM1: Normal   OM2: Normal   RCA: Normal   PDA: Normal   PLA: Normal   IMPRESSION: 1. Calcium  score 0   2.  Normal right dominant coronary arteries   3.  Normal ascending thoracic aorta 3.6 cm   Maude Emmer   Electronically Signed: By: Maude Emmer M.D. On: 12/04/2021 12:58    Echocardiogram February 2023 IMPRESSIONS   1. Left ventricular ejection fraction, by estimation, is 60 to 65%. The  left ventricle has normal function. The left ventricle has no regional  wall motion abnormalities. There is mild concentric left ventricular  hypertrophy.  Left ventricular diastolic  parameters are consistent with Grade I diastolic dysfunction (impaired  relaxation).   2. Right ventricular systolic function is normal. The right ventricular  size is normal.   3. Left atrial size was mild to moderately dilated.   4. The mitral valve is normal in structure. Trivial mitral valve  regurgitation. No evidence of mitral stenosis.   5. The aortic valve is tricuspid. Aortic valve regurgitation is not  visualized. No aortic stenosis is present.   6. Aortic dilatation noted. There is borderline dilatation of the  ascending aorta, measuring 36 mm.   7. The inferior vena cava is normal in size with greater than 50%  respiratory variability, suggesting right atrial pressure of 3 mmHg.   Recent Labs: 08/06/2023: ALT 20 08/11/2023: B Natriuretic Peptide 202.0; Magnesium  2.0 08/17/2023: BUN 16; Creatinine, Ser 1.07; Hemoglobin 12.7; Platelets 201; Potassium 4.2; Sodium 137  Recent Lipid Panel    Component Value Date/Time   CHOL 135 08/10/2023 0545   CHOL 200 (H) 10/26/2020 0942   TRIG 30 08/10/2023 0545   HDL 58 08/10/2023 0545   HDL 76 10/26/2020 0942   CHOLHDL 2.3 08/10/2023 0545   VLDL 6 08/10/2023 0545   LDLCALC 71 08/10/2023 0545   LDLCALC 115 (H) 10/26/2020 0942    Physical Exam:    VS:  BP 128/68   Pulse 64   Ht 5' 10.5 (1.791 m)   Wt 211 lb 12.8 oz (96.1 kg)   SpO2 95%   BMI 29.96 kg/m     Wt Readings from Last 3 Encounters:  06/02/24 211 lb 12.8 oz (96.1 kg)  04/03/24 215 lb (97.5 kg)  03/10/24 216 lb (98 kg)     GEN: Well nourished, well developed in no acute distress HEENT: Normal NECK: Noted JVD;  No carotid bruits, there is also suspicion of a small likely fat pad/cyst on palpation of the right neck LYMPHATICS: No lymphadenopathy CARDIAC: S1S2 noted,RRR, no murmurs, rubs, gallops RESPIRATORY:  Clear to auscultation without rales, wheezing or rhonchi  ABDOMEN: Soft, non-tender, non-distended, +bowel sounds, no  guarding. EXTREMITIES: +2 bilateral lower extremity edema edema, No cyanosis, no clubbing MUSCULOSKELETAL:  No deformity  SKIN: Warm and dry NEUROLOGIC:  Alert and oriented x 3, non-focal PSYCHIATRIC:  Normal affect, good insight  ASSESSMENT:    1. Mixed hyperlipidemia   2. Primary hypertension   3. History of CVA (cerebrovascular accident)          PLAN:    Hypertension - blood pressures at target.   Hyperlipidemia - continue current regimen  Hx of CVA and syncope - status post ILR no evidence of arrhythmia to date/.   The patient understands the need to lose weight with diet and exercise. We have discussed specific strategies for this.   The patient is in agreement with the above plan. The patient left the office in stable condition.  The patient will follow up in 4 months or sooner if needed.   Medication Adjustments/Labs and Tests Ordered: Current medicines are reviewed at length with the patient today.  Concerns regarding medicines are outlined above.  No orders of the defined types were placed in this encounter.  No orders of the defined types were placed in this encounter.   Patient Instructions  Medication Instructions:  Your physician recommends that you continue on your current medications as directed. Please refer to the Current Medication list given to you today.  *If you need a refill on your cardiac medications before your next appointment, please call your pharmacy*  Follow-Up: At [facility], you and your health needs are our priority.  As part of our continuing mission to provide you with exceptional heart care, our providers are all part of one team.  This team includes your primary Cardiologist (physician) and Advanced Practice Providers or APPs (Physician Assistants and Nurse Practitioners) who all work together to provide you with the care you need, when you need it.  Your next appointment:   6 month(s)  Provider:   Kurtis Anastasia, DO        Adopting a Healthy Lifestyle.  Know what a healthy weight is for you (roughly BMI <25) and aim to maintain this   Aim for 7+ servings of fruits and vegetables daily   65-80+ fluid ounces of water  or unsweet tea for healthy kidneys   Limit to max 1 drink of alcohol  per day; avoid smoking/tobacco   Limit animal fats in diet for cholesterol and heart health - choose grass fed whenever available   Avoid highly processed foods, and foods high in saturated/trans fats   Aim for low stress - take time to unwind and care for your mental health   Aim for 150 min of moderate intensity exercise weekly for heart health, and weights twice weekly for bone health   Aim for 7-9 hours of sleep daily   When it comes to diets, agreement about the perfect plan isnt easy to find, even among the experts. Experts at the [facility] [employer] developed an idea known as the Healthy Eating Plate. Just imagine a plate divided into logical, healthy portions.   The emphasis is on diet quality:   Load up on vegetables and fruits - one-half of your plate: Aim for color and variety, and remember that potatoes  dont count.   Go for whole grains - one-quarter of your plate: Whole wheat, barley, wheat berries, quinoa, oats, brown rice, and foods made with them. If you want pasta, go with whole wheat pasta.   Protein power - one-quarter of your plate: Fish, chicken, beans, and nuts are all healthy, versatile protein sources. Limit red meat.   The diet, however, does go beyond the plate, offering a few other suggestions.   Use healthy plant oils, such as olive, canola, soy, corn, sunflower and peanut. Check the labels, and avoid partially hydrogenated oil, which have unhealthy trans fats.   If youre thirsty, drink water . Coffee and tea are good in moderation, but skip sugary drinks and limit milk and dairy products to one or two daily servings.   The type of carbohydrate in the diet is more  important than the amount. Some sources of carbohydrates, such as vegetables, fruits, whole grains, and beans-are healthier than others.   Finally, stay active  Bonney Dub Huntsman, DO  06/05/2024 10:56 PM    [facility]

## 2024-06-11 DIAGNOSIS — H31002 Unspecified chorioretinal scars, left eye: Secondary | ICD-10-CM | POA: Diagnosis not present

## 2024-06-11 DIAGNOSIS — H401122 Primary open-angle glaucoma, left eye, moderate stage: Secondary | ICD-10-CM | POA: Diagnosis not present

## 2024-06-11 DIAGNOSIS — E119 Type 2 diabetes mellitus without complications: Secondary | ICD-10-CM | POA: Diagnosis not present

## 2024-06-11 DIAGNOSIS — H5340 Unspecified visual field defects: Secondary | ICD-10-CM | POA: Diagnosis not present

## 2024-06-24 ENCOUNTER — Ambulatory Visit (INDEPENDENT_AMBULATORY_CARE_PROVIDER_SITE_OTHER): Admitting: Podiatry

## 2024-06-24 DIAGNOSIS — Z91199 Patient's noncompliance with other medical treatment and regimen due to unspecified reason: Secondary | ICD-10-CM

## 2024-06-24 NOTE — Progress Notes (Signed)
 Cancel 24 hour

## 2024-06-30 ENCOUNTER — Ambulatory Visit (INDEPENDENT_AMBULATORY_CARE_PROVIDER_SITE_OTHER): Admitting: Podiatry

## 2024-06-30 ENCOUNTER — Encounter: Payer: Self-pay | Admitting: Podiatry

## 2024-06-30 DIAGNOSIS — M79675 Pain in left toe(s): Secondary | ICD-10-CM | POA: Diagnosis not present

## 2024-06-30 DIAGNOSIS — M79674 Pain in right toe(s): Secondary | ICD-10-CM

## 2024-06-30 DIAGNOSIS — I739 Peripheral vascular disease, unspecified: Secondary | ICD-10-CM

## 2024-06-30 DIAGNOSIS — B351 Tinea unguium: Secondary | ICD-10-CM | POA: Diagnosis not present

## 2024-06-30 DIAGNOSIS — E1142 Type 2 diabetes mellitus with diabetic polyneuropathy: Secondary | ICD-10-CM | POA: Diagnosis not present

## 2024-06-30 NOTE — Progress Notes (Signed)
 Chief Complaint  Patient presents with   Diabetes    I think I am getting my nails cut.  Dr. Ezekiel - 04/22/2024; A1c - 6.4    HPI: 83 y.o. female presents today for concern of thickened elongated and painful nails that are difficult to trim. Requesting to have them trimmed today. Relates burning and tingling in their feet. Patient is diabetic and last A1c was  Lab Results  Component Value Date   HGBA1C 6.8 (H) 08/07/2023   .   PCP:  Roanna Ezekiel [name], MD    Denies any other pedal complaints  Past Medical History:  Diagnosis Date   Allergy    Anal or rectal pain    sometimes   Anemia    hx of during pregnancy   Anxiety    Arthritis    Asthma    hx of   Bradycardia     I KNOW I HAVE BRADYCARDIA ESPECIALLY WHEN I SLEEP    Carotid artery occlusion    Cataract 2021   bilateral eyes   Chronic back pain    Degenerative joint disease    osteo   Depression    Diabetes mellitus without complication (HCC)    DM type II   Diverticulosis 2003   Dysrhythmia    hx of  due to eye drop and also low heart rate 40's per pt. Dr. Court follows   Elevated total protein    Esophageal dysmotility    Fibromyalgia    GERD (gastroesophageal reflux disease)    subsequent Nissen Fundoplication   Glaucoma    Hearing loss    Heart murmur    was told she had a heart murmur   Hemorrhoids    Hiatal hernia 11/08/2009   Hx of adenomatous colonic polyps 07/02/2002   Hypercalcemia    Hyperlipidemia    Hyperlipidemia    Hypertension    Implantable loop recorder present 11/28/2021   Nausea    Osteoporosis    PONV (postoperative nausea and vomiting)    Rectal bleeding    from hemorrhoids.     Sleep apnea    DOES USE CPAP    Stroke (HCC)    Thrombocytopenia (HCC)    Varicose veins of left lower extremity     Past Surgical History:  Procedure Laterality Date   CARDIAC CATHETERIZATION  03/14/1992   Normal cardiac cath. Normal LV function.   CARDIOVASCULAR STRESS TEST   01/22/2011   No scintigraphic evidence of inducible ischemia.   CAROTID DOPPLER  03/31/2007   Bilateral ICAs - no evidence of significant diameter reduction, dissectin, tortuosity, FMD, or any other vascular abnormality.   CATARACT EXTRACTION, BILATERAL     ESOPHAGEAL MANOMETRY N/A 11/14/2015   Procedure: ESOPHAGEAL MANOMETRY (EM);  Surgeon: Gustav Shila GAILS, MD;  Location: [facility];  Service: Endoscopy;  Laterality: N/A;   ESOPHAGEAL MANOMETRY N/A 01/18/2021   Procedure: ESOPHAGEAL MANOMETRY (EM);  Surgeon: Shila Gustav GAILS, MD;  Location: [facility];  Service: Endoscopy;  Laterality: N/A;   ESOPHAGOGASTRODUODENOSCOPY N/A 03/08/2021   Procedure: ESOPHAGOGASTRODUODENOSCOPY (EGD);  Surgeon: Shyrl Linnie KIDD, MD;  Location: [facility] OR;  Service: Thoracic;  Laterality: N/A;   EYE SURGERY     bilateral cataracts; bilateral stents   FOOT BONE EXCISION     GASTRIC RESECTION  2009   GLAUCOMA SURGERY     JOINT REPLACEMENT     right knee Dr. Melodi 06-23-18   KNEE SURGERY Bilateral    NISSEN FUNDOPLICATION  2000   with subsequent takedown in 2009   TOTAL KNEE ARTHROPLASTY Left 06/10/2017   Procedure: LEFT  TOTAL KNEE ARTHROPLASTY;  Surgeon: Melodi Lerner, MD;  Location: [facility];  Service: Orthopedics;  Laterality: Left;  Adductor Block   TOTAL KNEE ARTHROPLASTY Right 06/23/2018   Procedure: RIGHT TOTAL KNEE ARTHROPLASTY;  Surgeon: Melodi Lerner, MD;  Location: [facility];  Service: Orthopedics;  Laterality: Right;   TRANSCAROTID ARTERY REVASCULARIZATION  Right 08/09/2023   Procedure: TRANSCAROTID ARTERY REVASCULARIZATION;  Surgeon: Magda Debby SAILOR, MD;  Location: [facility] OR;  Service: Vascular;  Laterality: Right;   TRANSTHORACIC ECHOCARDIOGRAM  12/21/2010   EF 60%, moderate LVH,    TUBAL LIGATION     XI ROBOTIC ASSISTED HIATAL HERNIA REPAIR N/A 03/08/2021   Procedure: XI ROBOTIC ASSISTED LAPAROSCOPY WITH LYSIS OF ADHESIONS;  Surgeon: Shyrl Linnie KIDD, MD;  Location: [facility] OR;  Service:  Thoracic;  Laterality: N/A;    Allergies  Allergen Reactions   Adalat [Nifedipine] Other (See Comments)    Tired, Made pt feel crazy in the head   Hydrocodone  Itching and Nausea And Vomiting   Oxycodone -Acetaminophen  Itching   Meperidine Hcl Other (See Comments)    Demerol causes hallucinations   Naproxen Nausea And Vomiting    ROS    Physical Exam: There were no vitals filed for this visit.  Vascular: DP/PT pulses 2/4 bilateral. CFT <3 seconds. Absent hair growth on digits. Edema noted to bilateral lower extremities. Xerosis noted bilaterally.  Skin. No lacerations or abrasions bilateral feet. Nails 1-5 bilateral  are thickened discolored and elongated with subungual debris.  Musculoskeletal: MMT 5/5 bilateral lower extremities in DF, PF, Inversion and Eversion. Deceased ROM in DF of ankle joint. Pes planus noted bilateral.  Neurological: Sensation intact to light touch. Protective sensation diminished bilateral.    Assessment/Plan of Care: 1. Pain due to onychomycosis of toenails of both feet   2. Type 2 diabetes mellitus with diabetic polyneuropathy, without long-term current use of insulin  (HCC)   3. PVD (peripheral vascular disease) (HCC)       -Discussed and educated patient on diabetic foot care, especially with  regards to the vascular, neurological and musculoskeletal systems.  -Stressed the importance of good glycemic control and the detriment of not  controlling glucose levels in relation to the foot. -Discussed supportive shoes at all times and checking feet regularly.  -Mechanically debrided all nails 1-5 bilateral using sterile nail nipper and filed with dremel without incident  -Answered all patient questions -Patient to return  in 3 months for at risk foot care -Patient advised to call the office if any problems or questions arise in the meantime.

## 2024-07-06 ENCOUNTER — Ambulatory Visit: Payer: Self-pay | Admitting: Cardiology

## 2024-07-06 ENCOUNTER — Ambulatory Visit (INDEPENDENT_AMBULATORY_CARE_PROVIDER_SITE_OTHER)

## 2024-07-06 DIAGNOSIS — Z8673 Personal history of transient ischemic attack (TIA), and cerebral infarction without residual deficits: Secondary | ICD-10-CM

## 2024-07-06 LAB — CUP PACEART REMOTE DEVICE CHECK
Date Time Interrogation Session: 20250921232530
Implantable Pulse Generator Implant Date: 20240222

## 2024-07-07 NOTE — Progress Notes (Signed)
 Remote Loop Recorder Transmission

## 2024-07-20 NOTE — Progress Notes (Signed)
 Remote Loop Recorder Transmission

## 2024-08-05 ENCOUNTER — Encounter

## 2024-08-06 ENCOUNTER — Ambulatory Visit

## 2024-08-06 ENCOUNTER — Encounter

## 2024-08-06 ENCOUNTER — Encounter: Payer: Self-pay | Admitting: Neurology

## 2024-08-06 DIAGNOSIS — R55 Syncope and collapse: Secondary | ICD-10-CM

## 2024-08-06 LAB — CUP PACEART REMOTE DEVICE CHECK
Date Time Interrogation Session: 20251022232305
Implantable Pulse Generator Implant Date: 20240222

## 2024-08-07 ENCOUNTER — Ambulatory Visit: Payer: Self-pay | Admitting: Cardiology

## 2024-08-07 NOTE — Progress Notes (Signed)
 Remote Loop Recorder Transmission

## 2024-08-24 DIAGNOSIS — E7849 Other hyperlipidemia: Secondary | ICD-10-CM | POA: Diagnosis not present

## 2024-08-26 ENCOUNTER — Ambulatory Visit: Admitting: Pulmonary Disease

## 2024-08-26 ENCOUNTER — Encounter: Payer: Self-pay | Admitting: Pulmonary Disease

## 2024-08-26 VITALS — BP 135/76 | HR 67 | Ht 70.5 in | Wt 216.6 lb

## 2024-08-26 DIAGNOSIS — G4733 Obstructive sleep apnea (adult) (pediatric): Secondary | ICD-10-CM | POA: Diagnosis not present

## 2024-08-26 NOTE — Patient Instructions (Signed)
 I will see you back in about a year  Continue using your CPAP on a nightly basis  Graded exercises as tolerated  Make sure you address blood pressure with your doctor the next time you visit, you may need lower doses of your medications so that your blood pressure is not running too low  Call us  with significant concerns  Check your machine to see whether you can adjust the humidification higher Having a bowl of water  in the room may also help humidification Using medications like Biotene can help with dryness

## 2024-08-26 NOTE — Progress Notes (Signed)
 [name]    [id number]    18-Nov-1940  Primary Care Physician:Bakare, Ezekiel [name], MD  Referring Physician: Roanna Ezekiel [name], MD [address] [place],  [place] [address]  Chief complaint:   Patient is being seen for obstructive sleep apnea   HPI: Patient in for follow-up for sleep apnea  Has been tolerating CPAP well without significant difficulty Wakes up feeling like she has had a good nights rest  Discussed the need to stay active  She has been having some issues with her blood pressure recently, blood pressure running low and feeling lightheaded She has an appointment to follow-up with a cardiologist where she will discuss about optimizing her blood pressure meds  She is doing well otherwise  Has been using CPAP for many years diagnosed in 1999, was using CPAP regularly, was lost to follow-up Has been using CPAP with no issues She is on auto CPAP 5-20 Wakes up feeling rested   Outpatient Encounter Medications as of 08/26/2024  Medication Sig   acetaminophen  (TYLENOL ) 500 MG tablet Take 1,000 mg by mouth 2 (two) times daily as needed for headache (back and knee pain).   albuterol  (VENTOLIN  HFA) 108 (90 Base) MCG/ACT inhaler Inhale 1 puff into the lungs as needed for wheezing or shortness of breath.   amLODipine (NORVASC) 5 MG tablet Take 5 mg by mouth daily.   ascorbic acid  (VITAMIN C ) 500 MG tablet Take 1 tablet (500 mg total) by mouth daily.   brimonidine  (ALPHAGAN ) 0.2 % ophthalmic solution Place 1 drop into both eyes 2 (two) times daily.   cetirizine (ZYRTEC) 10 MG tablet Take 10 mg by mouth at bedtime.   Cholecalciferol  (VITAMIN D3) 250 MCG (10000 UT) capsule Take 10,000 Units by mouth daily.   clopidogrel  (PLAVIX ) 75 MG tablet Take 1 tablet (75 mg total) by mouth daily.   dexlansoprazole  (DEXILANT ) 60 MG capsule Take 1 capsule (60 mg total) by mouth daily.   Evolocumab  (REPATHA  SURECLICK) 140 MG/ML SOAJ Inject 140 mg into the skin  every 14 (fourteen) days.   indomethacin  (INDOCIN ) 50 MG capsule PLEASE SEE ATTACHED FOR DETAILED DIRECTIONS   ketoconazole  (NIZORAL ) 2 % cream Apply 1 Application topically daily.   latanoprost  (XALATAN ) 0.005 % ophthalmic solution Place 1 drop into the right eye at bedtime.   linaclotide  (LINZESS ) 290 MCG CAPS capsule Take 1 capsule (290 mcg total) by mouth daily before breakfast.   montelukast  (SINGULAIR ) 10 MG tablet Take 10 mg by mouth at bedtime.   OZEMPIC, 0.25 OR 0.5 MG/DOSE, 2 MG/3ML SOPN SMARTSIG:0.25 Milligram(s) SUB-Q Once a Week   Polyethyl Glycol-Propyl Glycol (SYSTANE OP) Place 1 drop into both eyes daily as needed (dry/irritated eyes).   rosuvastatin  (CRESTOR ) 40 MG tablet Take 40 mg by mouth at bedtime.   sertraline  (ZOLOFT ) 100 MG tablet Take 100 mg by mouth every morning.   sucralfate  (CARAFATE ) 1 g tablet Take 1 tablet (1 g total) by mouth 2 (two) times daily as needed. Do not take within 2 hours of any other medications.   telmisartan (MICARDIS) 80 MG tablet Take 80 mg by mouth every morning.   zinc  sulfate 220 (50 Zn) MG capsule Take 1 capsule (220 mg total) by mouth daily.   ezetimibe  (ZETIA ) 10 MG tablet Take 1 tablet (10 mg total) by mouth daily. (Patient not taking: Reported on 08/26/2024)   hydrocortisone  (ANUSOL -HC) 2.5 % rectal cream Place 1  Application rectally 2 (two) times daily. (Patient not taking: Reported on 08/26/2024)   hydrocortisone  (ANUSOL -HC) 25 MG suppository Place 1 suppository (25 mg total) rectally at bedtime. (Patient not taking: Reported on 08/26/2024)   losartan  (COZAAR ) 50 MG tablet SMARTSIG:1 Tablet(s) By Mouth Morning-Night (Patient not taking: Reported on 08/26/2024)   metFORMIN (GLUCOPHAGE) 500 MG tablet Take 500 mg by mouth every morning. (Patient not taking: Reported on 08/26/2024)   mirtazapine (REMERON) 7.5 MG tablet Take 7.5 mg by mouth at bedtime. (Patient not taking: Reported on 08/26/2024)   nystatin ointment (MYCOSTATIN) Apply 1  application  topically See admin instructions. 1 application 1-2 times a day as needed for rash (Patient not taking: Reported on 08/26/2024)   senna-docusate (SENOKOT-S) 8.6-50 MG tablet Take 2 tablets by mouth 2 (two) times daily. (Patient not taking: Reported on 08/26/2024)   No facility-administered encounter medications on file as of 08/26/2024.    Allergies as of 08/26/2024 - Review Complete 08/26/2024  Allergen Reaction Noted   Adalat [nifedipine] Other (See Comments) 09/17/2013   Hydrocodone  Itching and Nausea And Vomiting 09/18/2013   Oxycodone -acetaminophen  Itching 10/30/2023   Meperidine hcl Other (See Comments)    Naproxen Nausea And Vomiting     Past Medical History:  Diagnosis Date   Allergy    Anal or rectal pain    sometimes   Anemia    hx of during pregnancy   Anxiety    Arthritis    Asthma    hx of   Bradycardia     I KNOW I HAVE BRADYCARDIA ESPECIALLY WHEN I SLEEP    Carotid artery occlusion    Cataract 2021   bilateral eyes   Chronic back pain    Degenerative joint disease    osteo   Depression    Diabetes mellitus without complication (HCC)    DM type II   Diverticulosis 2003   Dysrhythmia    hx of  due to eye drop and also low heart rate 40's per pt. Dr. Court follows   Elevated total protein    Esophageal dysmotility    Fibromyalgia    GERD (gastroesophageal reflux disease)    subsequent Nissen Fundoplication   Glaucoma    Hearing loss    Heart murmur    was told she had a heart murmur   Hemorrhoids    Hiatal hernia 11/08/2009   Hx of adenomatous colonic polyps 07/02/2002   Hypercalcemia    Hyperlipidemia    Hyperlipidemia    Hypertension    Implantable loop recorder present 11/28/2021   Nausea    Osteoporosis    PONV (postoperative nausea and vomiting)    Rectal bleeding    from hemorrhoids.     Sleep apnea    DOES USE CPAP    Stroke (HCC)    Thrombocytopenia    Varicose veins of left lower extremity     Past Surgical  History:  Procedure Laterality Date   CARDIAC CATHETERIZATION  03/14/1992   Normal cardiac cath. Normal LV function.   CARDIOVASCULAR STRESS TEST  01/22/2011   No scintigraphic evidence of inducible ischemia.   CAROTID DOPPLER  03/31/2007   Bilateral ICAs - no evidence of significant diameter reduction, dissectin, tortuosity, FMD, or any other vascular abnormality.   CATARACT EXTRACTION, BILATERAL     ESOPHAGEAL MANOMETRY N/A 11/14/2015   Procedure: ESOPHAGEAL MANOMETRY (EM);  Surgeon: Gustav Shila GAILS, MD;  Location: [facility];  Service: Endoscopy;  Laterality: N/A;   ESOPHAGEAL MANOMETRY N/A 01/18/2021  Procedure: ESOPHAGEAL MANOMETRY (EM);  Surgeon: Shila Gustav GAILS, MD;  Location: [facility];  Service: Endoscopy;  Laterality: N/A;   ESOPHAGOGASTRODUODENOSCOPY N/A 03/08/2021   Procedure: ESOPHAGOGASTRODUODENOSCOPY (EGD);  Surgeon: Shyrl Linnie KIDD, MD;  Location: [facility] OR;  Service: Thoracic;  Laterality: N/A;   EYE SURGERY     bilateral cataracts; bilateral stents   FOOT BONE EXCISION     GASTRIC RESECTION  2009   GLAUCOMA SURGERY     JOINT REPLACEMENT     right knee Dr. Melodi 06-23-18   KNEE SURGERY Bilateral    NISSEN FUNDOPLICATION  2000   with subsequent takedown in 2009   TOTAL KNEE ARTHROPLASTY Left 06/10/2017   Procedure: LEFT  TOTAL KNEE ARTHROPLASTY;  Surgeon: Melodi Lerner, MD;  Location: [facility];  Service: Orthopedics;  Laterality: Left;  Adductor Block   TOTAL KNEE ARTHROPLASTY Right 06/23/2018   Procedure: RIGHT TOTAL KNEE ARTHROPLASTY;  Surgeon: Melodi Lerner, MD;  Location: [facility];  Service: Orthopedics;  Laterality: Right;   TRANSCAROTID ARTERY REVASCULARIZATION  Right 08/09/2023   Procedure: TRANSCAROTID ARTERY REVASCULARIZATION;  Surgeon: Magda Debby SAILOR, MD;  Location: [facility] OR;  Service: Vascular;  Laterality: Right;   TRANSTHORACIC ECHOCARDIOGRAM  12/21/2010   EF 60%, moderate LVH,    TUBAL LIGATION     XI ROBOTIC ASSISTED HIATAL HERNIA REPAIR  N/A 03/08/2021   Procedure: XI ROBOTIC ASSISTED LAPAROSCOPY WITH LYSIS OF ADHESIONS;  Surgeon: Shyrl Linnie KIDD, MD;  Location: [facility] OR;  Service: Thoracic;  Laterality: N/A;    Family History  Problem Relation Age of Onset   Heart disease Mother    Hypertension Mother    Diabetes Mother    Diabetes Father    Kidney disease Father    Stroke Brother    Cancer Maternal Grandmother    Kidney disease Maternal Grandfather    Breast cancer Daughter    Stomach cancer Maternal Aunt    Uterine cancer Maternal Aunt    Leukemia Maternal Uncle    Prostate cancer Maternal Uncle    Colon cancer Neg Hx    Rectal cancer Neg Hx    Esophageal cancer Neg Hx     Social History   Socioeconomic History   Marital status: Married    Spouse name: Not on file   Number of children: 8   Years of education: Not on file   Highest education level: Not on file  Occupational History   Occupation: retired    Comment: retired clinical biochemist.   Tobacco Use   Smoking status: Never   Smokeless tobacco: Never  Vaping Use   Vaping status: Never Used  Substance and Sexual Activity   Alcohol  use: No    Alcohol /week: 0.0 standard drinks of alcohol    Drug use: No   Sexual activity: Yes  Other Topics Concern   Not on file  Social History Narrative   Husband, Mervin Lame is Next of Kin. Cell # [phone]      Right handed    Wear glasses drinks    Social Drivers of Health   Financial Resource Strain: Not on file  Food Insecurity: No Food Insecurity (08/07/2023)   Hunger Vital Sign    Worried About Running Out of Food in the Last Year: Never true    Ran Out of Food in the Last Year: Never true  Transportation Needs: No Transportation Needs (08/07/2023)   PRAPARE - Administrator, Civil Service (Medical): No    Lack of Transportation (Non-Medical): No  Physical Activity: Not on file  Stress: Not on file  Social Connections: Not on file  Intimate Partner Violence: Not At Risk  (08/07/2023)   Humiliation, Afraid, Rape, and Kick questionnaire    Fear of Current or Ex-Partner: No    Emotionally Abused: No    Physically Abused: No    Sexually Abused: No    Review of Systems  Respiratory:  Positive for apnea. Negative for shortness of breath.   Psychiatric/Behavioral:  Positive for sleep disturbance.     Vitals:   08/26/24 1019  BP: 135/76  Pulse: 67  SpO2: 96%     Physical Exam Constitutional:      Appearance: Normal appearance.  HENT:     Head: Normocephalic.     Mouth/Throat:     Mouth: Mucous membranes are moist.  Eyes:     General: No scleral icterus. Cardiovascular:     Heart sounds: No murmur heard.    No friction rub.  Pulmonary:     Effort: No respiratory distress.     Breath sounds: No stridor. No wheezing or rhonchi.  Musculoskeletal:     Cervical back: No rigidity or tenderness.  Neurological:     Mental Status: She is alert.  Psychiatric:        Mood and Affect: Mood normal.     Data Reviewed: CPAP compliance reviewed showing excellent compliance with 6 hours 43 minutes AutoSet 5-20 Residual AHI of 2.7  Recent home sleep study 01/30/2023 shows severe obstructive sleep apnea with AHI of 35.6  Assessment:  Severe obstructive sleep apnea - Compliant with CPAP - Continues to benefit from CPAP use  Having some issues with dryness of her mouth - She will adjust the humidification of the machine - Having an open bowl of water  in the room we also help with humidification  Importance of graded activities on a regular basis discussed   Plan/Recommendations:  Follow-up on a yearly basis  Encouraged to give us  a call with significant concerns  Encourage graded activities as tolerated  Encouraged to make sure she addresses blood pressure issues during her next visit  Jennet Epley MD [facility] 08/26/2024, 9:14 PM  CC: Roanna Ezekiel [name], MD

## 2024-09-02 DIAGNOSIS — Z23 Encounter for immunization: Secondary | ICD-10-CM | POA: Diagnosis not present

## 2024-09-02 DIAGNOSIS — Z136 Encounter for screening for cardiovascular disorders: Secondary | ICD-10-CM | POA: Diagnosis not present

## 2024-09-02 DIAGNOSIS — Z1231 Encounter for screening mammogram for malignant neoplasm of breast: Secondary | ICD-10-CM | POA: Diagnosis not present

## 2024-09-02 DIAGNOSIS — G3184 Mild cognitive impairment, so stated: Secondary | ICD-10-CM | POA: Diagnosis not present

## 2024-09-02 DIAGNOSIS — Z6831 Body mass index (BMI) 31.0-31.9, adult: Secondary | ICD-10-CM | POA: Diagnosis not present

## 2024-09-02 DIAGNOSIS — Z0001 Encounter for general adult medical examination with abnormal findings: Secondary | ICD-10-CM | POA: Diagnosis not present

## 2024-09-02 DIAGNOSIS — E6609 Other obesity due to excess calories: Secondary | ICD-10-CM | POA: Diagnosis not present

## 2024-09-02 DIAGNOSIS — K648 Other hemorrhoids: Secondary | ICD-10-CM | POA: Diagnosis not present

## 2024-09-02 DIAGNOSIS — E1165 Type 2 diabetes mellitus with hyperglycemia: Secondary | ICD-10-CM | POA: Diagnosis not present

## 2024-09-02 DIAGNOSIS — Z1211 Encounter for screening for malignant neoplasm of colon: Secondary | ICD-10-CM | POA: Diagnosis not present

## 2024-09-02 DIAGNOSIS — Z13820 Encounter for screening for osteoporosis: Secondary | ICD-10-CM | POA: Diagnosis not present

## 2024-09-02 DIAGNOSIS — F321 Major depressive disorder, single episode, moderate: Secondary | ICD-10-CM | POA: Diagnosis not present

## 2024-09-05 ENCOUNTER — Encounter

## 2024-09-06 ENCOUNTER — Ambulatory Visit

## 2024-09-09 ENCOUNTER — Ambulatory Visit

## 2024-09-09 DIAGNOSIS — G459 Transient cerebral ischemic attack, unspecified: Secondary | ICD-10-CM

## 2024-09-09 LAB — CUP PACEART REMOTE DEVICE CHECK
Date Time Interrogation Session: 20251125232343
Implantable Pulse Generator Implant Date: 20240222

## 2024-09-11 NOTE — Progress Notes (Signed)
 Remote Loop Recorder Transmission

## 2024-09-17 ENCOUNTER — Ambulatory Visit: Payer: Self-pay | Admitting: Cardiology

## 2024-09-18 ENCOUNTER — Telehealth: Payer: Self-pay | Admitting: Cardiology

## 2024-09-18 NOTE — Telephone Encounter (Signed)
 ILR implanted in 12/06/22 for syncope.   Patient  is coming due for yearly follow up in March. Please schedule a next available with Dr. Inocencio so he may review with her if she should keep her ILR implanted or if ok for explant.   Thank you!

## 2024-09-18 NOTE — Telephone Encounter (Signed)
 Pt called in stating she does not feel that her loop recorder is serving her any purpose and she was told she wouldn't need it past 6 months. She asked if she can get set up to have it removed.

## 2024-09-28 NOTE — Telephone Encounter (Signed)
 Spoke w/ patient - she is scheduled for 3/9 @ 830 for her yearly f/u to review if patient should keep her loop or if ok to explant. Sent to precert incase.

## 2024-09-29 ENCOUNTER — Ambulatory Visit: Admitting: Podiatry

## 2024-10-05 ENCOUNTER — Ambulatory Visit: Admitting: Podiatry

## 2024-10-06 ENCOUNTER — Encounter

## 2024-10-07 ENCOUNTER — Ambulatory Visit

## 2024-10-10 ENCOUNTER — Ambulatory Visit

## 2024-10-10 DIAGNOSIS — G459 Transient cerebral ischemic attack, unspecified: Secondary | ICD-10-CM | POA: Diagnosis not present

## 2024-10-12 ENCOUNTER — Ambulatory Visit: Payer: Self-pay | Admitting: Cardiology

## 2024-10-12 LAB — CUP PACEART REMOTE DEVICE CHECK
Date Time Interrogation Session: 20251226231642
Implantable Pulse Generator Implant Date: 20240222

## 2024-10-14 NOTE — Progress Notes (Signed)
 Remote Loop Recorder Transmission

## 2024-11-06 ENCOUNTER — Encounter

## 2024-11-07 ENCOUNTER — Ambulatory Visit

## 2024-11-09 ENCOUNTER — Emergency Department (HOSPITAL_COMMUNITY)

## 2024-11-09 ENCOUNTER — Other Ambulatory Visit: Payer: Self-pay

## 2024-11-09 ENCOUNTER — Observation Stay (HOSPITAL_COMMUNITY)
Admission: EM | Admit: 2024-11-09 | Discharge: 2024-11-10 | Disposition: A | Discharge status: Home / Self Care | Attending: Student | Admitting: Student

## 2024-11-09 DIAGNOSIS — I10 Essential (primary) hypertension: Secondary | ICD-10-CM | POA: Diagnosis not present

## 2024-11-09 DIAGNOSIS — I6521 Occlusion and stenosis of right carotid artery: Secondary | ICD-10-CM | POA: Diagnosis not present

## 2024-11-09 DIAGNOSIS — Z79899 Other long term (current) drug therapy: Secondary | ICD-10-CM | POA: Diagnosis not present

## 2024-11-09 DIAGNOSIS — R42 Dizziness and giddiness: Principal | ICD-10-CM

## 2024-11-09 DIAGNOSIS — R519 Headache, unspecified: Secondary | ICD-10-CM | POA: Diagnosis present

## 2024-11-09 DIAGNOSIS — R531 Weakness: Secondary | ICD-10-CM | POA: Diagnosis not present

## 2024-11-09 DIAGNOSIS — R079 Chest pain, unspecified: Secondary | ICD-10-CM | POA: Diagnosis not present

## 2024-11-09 DIAGNOSIS — J45909 Unspecified asthma, uncomplicated: Secondary | ICD-10-CM | POA: Insufficient documentation

## 2024-11-09 DIAGNOSIS — R55 Syncope and collapse: Secondary | ICD-10-CM | POA: Diagnosis not present

## 2024-11-09 DIAGNOSIS — E785 Hyperlipidemia, unspecified: Secondary | ICD-10-CM | POA: Diagnosis not present

## 2024-11-09 DIAGNOSIS — K219 Gastro-esophageal reflux disease without esophagitis: Secondary | ICD-10-CM | POA: Diagnosis not present

## 2024-11-09 DIAGNOSIS — Z96653 Presence of artificial knee joint, bilateral: Secondary | ICD-10-CM | POA: Diagnosis not present

## 2024-11-09 DIAGNOSIS — G4733 Obstructive sleep apnea (adult) (pediatric): Secondary | ICD-10-CM | POA: Diagnosis not present

## 2024-11-09 DIAGNOSIS — E119 Type 2 diabetes mellitus without complications: Secondary | ICD-10-CM | POA: Diagnosis not present

## 2024-11-09 DIAGNOSIS — G4730 Sleep apnea, unspecified: Secondary | ICD-10-CM | POA: Diagnosis present

## 2024-11-09 DIAGNOSIS — F32A Depression, unspecified: Secondary | ICD-10-CM | POA: Diagnosis not present

## 2024-11-09 DIAGNOSIS — R001 Bradycardia, unspecified: Secondary | ICD-10-CM | POA: Insufficient documentation

## 2024-11-09 DIAGNOSIS — Z7902 Long term (current) use of antithrombotics/antiplatelets: Secondary | ICD-10-CM | POA: Insufficient documentation

## 2024-11-09 LAB — CBC
HCT: 38.7 % (ref 36.0–46.0)
Hemoglobin: 12.6 g/dL (ref 12.0–15.0)
MCH: 31.3 pg (ref 26.0–34.0)
MCHC: 32.6 g/dL (ref 30.0–36.0)
MCV: 96 fL (ref 80.0–100.0)
Platelets: 158 10*3/uL (ref 150–400)
RBC: 4.03 MIL/uL (ref 3.87–5.11)
RDW: 13.5 % (ref 11.5–15.5)
WBC: 5.9 10*3/uL (ref 4.0–10.5)
nRBC: 0 % (ref 0.0–0.2)

## 2024-11-09 LAB — URINALYSIS, ROUTINE W REFLEX MICROSCOPIC
Bilirubin Urine: NEGATIVE
Glucose, UA: NEGATIVE mg/dL
Hgb urine dipstick: NEGATIVE
Ketones, ur: NEGATIVE mg/dL
Nitrite: NEGATIVE
Protein, ur: NEGATIVE mg/dL
Specific Gravity, Urine: 1.006 (ref 1.005–1.030)
pH: 6 (ref 5.0–8.0)

## 2024-11-09 LAB — BASIC METABOLIC PANEL WITH GFR
Anion gap: 12 (ref 5–15)
BUN: 21 mg/dL (ref 8–23)
CO2: 23 mmol/L (ref 22–32)
Calcium: 9.2 mg/dL (ref 8.9–10.3)
Chloride: 102 mmol/L (ref 98–111)
Creatinine, Ser: 1.38 mg/dL — ABNORMAL HIGH (ref 0.44–1.00)
GFR, Estimated: 38 mL/min — ABNORMAL LOW
Glucose, Bld: 87 mg/dL (ref 70–99)
Potassium: 4.3 mmol/L (ref 3.5–5.1)
Sodium: 137 mmol/L (ref 135–145)

## 2024-11-09 LAB — TROPONIN T, HIGH SENSITIVITY
Troponin T High Sensitivity: 30 ng/L — ABNORMAL HIGH (ref 0–19)
Troponin T High Sensitivity: 26 ng/L — ABNORMAL HIGH (ref 0–19)

## 2024-11-09 LAB — CBG MONITORING, ED: Glucose-Capillary: 84 mg/dL (ref 70–99)

## 2024-11-09 LAB — D-DIMER, QUANTITATIVE: D-Dimer, Quant: 2.93 ug{FEU}/mL — ABNORMAL HIGH (ref 0.00–0.50)

## 2024-11-09 MED ORDER — CLOPIDOGREL BISULFATE 75 MG PO TABS
75.0000 mg | ORAL_TABLET | Freq: Every day | ORAL | Status: DC
  Administered 2024-11-10: 75 mg via ORAL
  Filled 2024-11-09: qty 1

## 2024-11-09 MED ORDER — ENOXAPARIN SODIUM 40 MG/0.4ML IJ SOSY
40.0000 mg | PREFILLED_SYRINGE | INTRAMUSCULAR | Status: DC
  Administered 2024-11-10: 40 mg via SUBCUTANEOUS
  Filled 2024-11-09: qty 0.4

## 2024-11-09 MED ORDER — SODIUM CHLORIDE 0.9 % IV BOLUS: 1000.0000 mL | Freq: Once | INTRAVENOUS | Status: DC

## 2024-11-09 MED ORDER — LACTATED RINGERS IV BOLUS
500.0000 mL | Freq: Once | INTRAVENOUS | Status: AC
  Administered 2024-11-09: 500 mL via INTRAVENOUS

## 2024-11-09 MED ORDER — ONDANSETRON HCL 4 MG/2ML IJ SOLN: 4.0000 mg | Freq: Four times a day (QID) | INTRAMUSCULAR | Status: DC | PRN

## 2024-11-09 MED ORDER — SODIUM CHLORIDE 0.9 % IV SOLN: INTRAVENOUS | Status: DC

## 2024-11-09 MED ORDER — IOHEXOL 350 MG/ML SOLN
75.0000 mL | Freq: Once | INTRAVENOUS | Status: AC | PRN
  Administered 2024-11-09: 58 mL via INTRAVENOUS

## 2024-11-09 MED ORDER — SODIUM CHLORIDE 0.9% FLUSH
3.0000 mL | Freq: Two times a day (BID) | INTRAVENOUS | Status: DC
  Administered 2024-11-09: 3 mL via INTRAVENOUS

## 2024-11-09 MED ORDER — ONDANSETRON HCL 4 MG PO TABS: 4.0000 mg | ORAL_TABLET | Freq: Four times a day (QID) | ORAL | Status: DC | PRN

## 2024-11-09 NOTE — ED Triage Notes (Signed)
 Patient from home for eval of several near-syncopal episodes today. States every time she stood up she felt her vision going blurry and like she was going to pass out. She had associated chest pain with each of these episodes and on arrival just endorses mild chest tightness.

## 2024-11-09 NOTE — ED Provider Notes (Signed)
 " [place] EMERGENCY DEPARTMENT AT [facility] Provider Note   CSN: [id number] Arrival date & time: 11/09/24  1644     Patient presents with: Near Syncope and Chest Pain   [name] is a 84 y.o. female.   84 year old female presenting with multiple complaints.  Patient reports that around 1:45 PM today that she had multiple symptoms, including feeling like my head was going to explode, blurred vision bilaterally, weakness in all extremities, chest tightness, presyncopal without loss of consciousness or fall.  Patient notes that she checked her blood pressure at home and it was 80/30, she is on telmisartan and amlodipine  for management of her blood pressure.  Patient has a history of syncopal events and does have an implanted loop recorder, she is followed by cardiology and has an appointment in March.  Reports that she has been told in the past that she may benefit from a pacemaker, but this was never done.  Patient describes her symptoms today as strokelike, states that she does have a history of multiple strokes dating back to 2024, is on Plavix .   Near Syncope Associated symptoms include chest pain.  Chest Pain Associated symptoms: near-syncope        Prior to Admission medications  Medication Sig Start Date End Date Taking? Authorizing Provider  acetaminophen  (TYLENOL ) 500 MG tablet Take 1,000 mg by mouth 2 (two) times daily as needed for headache (back and knee pain).    [provider]  albuterol  (VENTOLIN  HFA) 108 (90 Base) MCG/ACT inhaler Inhale 1 puff into the lungs as needed for wheezing or shortness of breath. 01/29/22   [provider]  amLODipine  (NORVASC ) 5 MG tablet Take 5 mg by mouth daily. 05/19/24   [provider]  ascorbic acid  (VITAMIN C ) 500 MG tablet Take 1 tablet (500 mg total) by mouth daily. 10/23/22   Amin, Sumayya, MD  brimonidine  (ALPHAGAN ) 0.2 % ophthalmic solution Place 1 drop into both eyes 2 (two) times daily.  09/26/22   [provider]  cetirizine (ZYRTEC) 10 MG tablet Take 10 mg by mouth at bedtime.    [provider]  Cholecalciferol  (VITAMIN D3) 250 MCG (10000 UT) capsule Take 10,000 Units by mouth daily.    [provider]  clopidogrel  (PLAVIX ) 75 MG tablet Take 1 tablet (75 mg total) by mouth daily. 08/08/23   Dennise Lavada POUR, MD  dexlansoprazole  (DEXILANT ) 60 MG capsule Take 1 capsule (60 mg total) by mouth daily. 04/03/24   Beather Delon Gibson, PA  Evolocumab  (REPATHA  SURECLICK) 140 MG/ML SOAJ Inject 140 mg into the skin every 14 (fourteen) days. 01/23/24     ezetimibe  (ZETIA ) 10 MG tablet Take 1 tablet (10 mg total) by mouth daily. Patient not taking: Reported on 08/26/2024 08/09/23   Singh, Prashant K, MD  hydrocortisone  (ANUSOL -HC) 2.5 % rectal cream Place 1 Application rectally 2 (two) times daily. Patient not taking: Reported on 08/26/2024 04/03/24   Beather Delon Gibson, PA  hydrocortisone  (ANUSOL -HC) 25 MG suppository Place 1 suppository (25 mg total) rectally at bedtime. Patient not taking: Reported on 08/26/2024 01/09/23   Kennedy-Smith, Colleen M, NP  indomethacin  (INDOCIN ) 50 MG capsule PLEASE SEE ATTACHED FOR DETAILED DIRECTIONS 12/04/23   Rosemarie Eather RAMAN, MD  ketoconazole  (NIZORAL ) 2 % cream Apply 1 Application topically daily. 05/17/23   Joya Stabs, DPM  latanoprost  (XALATAN ) 0.005 % ophthalmic solution Place 1 drop into the right eye at bedtime. 06/10/23   [provider]  linaclotide  (LINZESS ) 290  MCG CAPS capsule Take 1 capsule (290 mcg total) by mouth daily before breakfast. 04/03/24   Beather Delon Gibson, PA  losartan  (COZAAR ) 50 MG tablet SMARTSIG:1 Tablet(s) By Mouth Morning-Night Patient not taking: Reported on 08/26/2024 11/06/23   [provider]  metFORMIN (GLUCOPHAGE) 500 MG tablet Take 500 mg by mouth every morning. Patient not taking: Reported on 08/26/2024 08/01/23   [provider]  mirtazapine (REMERON)  7.5 MG tablet Take 7.5 mg by mouth at bedtime. Patient not taking: Reported on 08/26/2024 09/16/23   [provider]  montelukast  (SINGULAIR ) 10 MG tablet Take 10 mg by mouth at bedtime.    [provider]  nystatin ointment (MYCOSTATIN) Apply 1 application  topically See admin instructions. 1 application 1-2 times a day as needed for rash Patient not taking: Reported on 08/26/2024 11/20/16   [provider]  OZEMPIC, 0.25 OR 0.5 MG/DOSE, 2 MG/3ML SOPN SMARTSIG:0.25 Milligram(s) SUB-Q Once a Week 12/03/23   [provider]  Polyethyl Glycol-Propyl Glycol (SYSTANE OP) Place 1 drop into both eyes daily as needed (dry/irritated eyes).    [provider]  rosuvastatin  (CRESTOR ) 40 MG tablet Take 40 mg by mouth at bedtime.    [provider]  senna-docusate (SENOKOT-S) 8.6-50 MG tablet Take 2 tablets by mouth 2 (two) times daily. Patient not taking: Reported on 08/26/2024 08/12/23   Krishnan, Gokul, MD  sertraline  (ZOLOFT ) 100 MG tablet Take 100 mg by mouth every morning. 03/29/22   [provider]  sucralfate  (CARAFATE ) 1 g tablet Take 1 tablet (1 g total) by mouth 2 (two) times daily as needed. Do not take within 2 hours of any other medications. 04/03/24   Beather Delon Gibson, PA  telmisartan (MICARDIS) 80 MG tablet Take 80 mg by mouth every morning. 09/27/23   [provider]  zinc  sulfate 220 (50 Zn) MG capsule Take 1 capsule (220 mg total) by mouth daily. 10/23/22   Amin, Sumayya, MD    Allergies: Adalat [nifedipine], Hydrocodone , Oxycodone -acetaminophen , Meperidine hcl, and Naproxen    Review of Systems  Cardiovascular:  Positive for chest pain and near-syncope.    Updated Vital Signs  Vitals:   11/09/24 2000 11/09/24 2100 11/09/24 2200 11/09/24 2300  BP: (!) 169/73 (!) 162/62 (!) 168/56 (!) 165/61  Pulse: (!) 48   (!) 52  Resp: (!) 21 (!) 28 19 (!) 21  Temp: 98.2 F (36.8 C)   98 F (36.7 C)  TempSrc: Oral   Oral   SpO2: 100%   100%     Physical Exam Vitals and nursing note reviewed.  Constitutional:      General: She is not in acute distress.    Appearance: Normal appearance. She is not ill-appearing or toxic-appearing.  HENT:     Head: Normocephalic and atraumatic.  Eyes:     Extraocular Movements: Extraocular movements intact.     Pupils: Pupils are equal, round, and reactive to light.  Cardiovascular:     Rate and Rhythm: Normal rate and regular rhythm.     Heart sounds: Normal heart sounds.  Pulmonary:     Effort: Pulmonary effort is normal.     Breath sounds: Normal breath sounds.  Musculoskeletal:     Cervical back: Normal range of motion.     Right lower leg: No edema.     Left lower leg: No edema.     Comments: Moves all extremities spontaneously without difficulty 4/5 strength against resistance of bilateral upper extremities with diminished grip strength  5/5 strength against resistance of bilateral lower extremities  Skin:    General: Skin is warm and dry.  Neurological:     Mental Status: She is alert and oriented to person, place, and time.     Comments: Facial expressions are symmetric and intact without evidence of facial droop Patient endorses sensory deficit to the right lower extremity as compared to the left Normal cerebellar testing without ataxia, including finger-to-nose Negative pronator drift     (all labs ordered are listed, but only abnormal results are displayed) Labs Reviewed  BASIC METABOLIC PANEL WITH GFR - Abnormal; Notable for the following components:      Result Value   Creatinine, Ser 1.38 (*)    GFR, Estimated 38 (*)    All other components within normal limits  URINALYSIS, ROUTINE W REFLEX MICROSCOPIC - Abnormal; Notable for the following components:   Leukocytes,Ua SMALL (*)    Bacteria, UA RARE (*)    All other components within normal limits  D-DIMER, QUANTITATIVE - Abnormal; Notable for the following components:   D-Dimer, Quant 2.93  (*)    All other components within normal limits  TROPONIN T, HIGH SENSITIVITY - Abnormal; Notable for the following components:   Troponin T High Sensitivity 30 (*)    All other components within normal limits  TROPONIN T, HIGH SENSITIVITY - Abnormal; Notable for the following components:   Troponin T High Sensitivity 26 (*)    All other components within normal limits  URINE CULTURE  CBC  CBC  CREATININE, SERUM  CBC  COMPREHENSIVE METABOLIC PANEL WITH GFR  CBG MONITORING, ED    EKG: EKG Interpretation Date/Time:  Monday November 09 2024 17:02:21 EST Ventricular Rate:  55 PR Interval:  180 QRS Duration:  100 QT Interval:  424 QTC Calculation: 405 R Axis:   12  Text Interpretation: Sinus bradycardia Moderate voltage criteria for LVH, may be normal variant ( R in aVL , Cornell product ) , which has been previously demonstrated Borderline ECG Prior inferior T wave inversions are now flattened T waves in lead III Confirmed by Rogelia Satterfield ([id number]) on 11/09/2024 5:48:40 PM  Radiology: CT Angio Chest PE W and/or Wo Contrast Result Date: 11/09/2024 EXAM: CTA of the Chest with contrast for PE 11/09/2024 10:06:16 PM TECHNIQUE: CTA of the chest was performed after the administration of 58 mL of iohexol  (OMNIPAQUE ) 350 MG/ML injection. Multiplanar reformatted images are provided for review. MIP images are provided for review. Automated exposure control, iterative reconstruction, and/or weight based adjustment of the mA/kV was utilized to reduce the radiation dose to as low as reasonably achievable. COMPARISON: None available. CLINICAL HISTORY: Elevated d-dimer, presyncopal today with chest tightness. FINDINGS: PULMONARY ARTERIES: Pulmonary arteries are adequately opacified for evaluation. No pulmonary embolism. Main pulmonary artery is normal in caliber. MEDIASTINUM: Coronary artery calcifications and mitral annular calcification are present. The pericardium demonstrates no acute abnormality.  The thoracic aorta is normal in caliber. There is moderate atherosclerotic plaque. LYMPH NODES: No mediastinal, hilar or axillary lymphadenopathy. LUNGS AND PLEURA: Right lower lobe basilar 6 x 4 mm pulmonary nodule (5.84). No focal consolidation or pulmonary edema. No pleural effusion or pneumothorax. UPPER ABDOMEN: Small to moderate volume hiatal hernia. Associated surgical changes along the gastric wall. Calcified gallstone within the gallbladder lumen. No gallbladder wall thickening or pericholecystic fluid. Colonic diverticulosis. SOFT TISSUES AND BONES: Severe degenerative change of the right shoulder. No acute soft tissue abnormality. IMPRESSION: 1. No pulmonary embolism. 2. Right lower lobe basilar 6 x  4 mm pulmonary nodule - no further follow-up indicated. 3. Small to moderate volume hiatal hernia with associated surgical changes along the gastric wall. 4. Cholelithiasis with no evidence of acute cholecystitis. 5. Colonic diverticulosis without acute diverticulitis. Electronically signed by: Morgane Naveau MD 11/09/2024 10:20 PM EST RP Workstation: [id number]   DG Chest 2 View Result Date: 11/09/2024 EXAM: 2 VIEW(S) XRAY OF THE CHEST 11/09/2024 06:05:00 PM COMPARISON: Comparison with 08/17/2023. CLINICAL HISTORY: CP Chest pain. FINDINGS: LINES, TUBES AND DEVICES: A loop recorder is present. LUNGS AND PLEURA: Lungs are clear. No pleural effusion or pneumothorax. HEART AND MEDIASTINUM: Heart size and pulmonary vascularity are normal. Mediastinal contours appear intact. Calcification of the aorta. BONES AND SOFT TISSUES: No acute osseous abnormality. IMPRESSION: 1. No acute cardiopulmonary abnormality. Electronically signed by: Elsie Gravely MD 11/09/2024 06:10 PM EST RP Workstation: [id number]   CT HEAD WO CONTRAST Result Date: 11/09/2024 EXAM: CT HEAD WITHOUT CONTRAST 11/09/2024 05:23:00 PM TECHNIQUE: CT of the head was performed without the administration of intravenous contrast. Automated exposure  control, iterative reconstruction, and/or weight based adjustment of the mA/kV was utilized to reduce the radiation dose to as low as reasonably achievable. COMPARISON: 08/07/2023. CLINICAL HISTORY: near syncopal event. FINDINGS: BRAIN AND VENTRICLES: No acute hemorrhage. No evidence of acute infarct. No extra-axial collection. No mass effect or midline shift. ORBITS: No acute abnormality. SINUSES: No acute abnormality. SOFT TISSUES AND SKULL: No acute soft tissue abnormality. No skull fracture. IMPRESSION: 1. No acute intracranial abnormality. Electronically signed by: Oneil Devonshire MD 11/09/2024 05:28 PM EST RP Workstation: [id number]     Procedures   Medications Ordered in the ED  clopidogrel  (PLAVIX ) tablet 75 mg (has no administration in time range)  sodium chloride  flush (NS) 0.9 % injection 3 mL (has no administration in time range)  enoxaparin  (LOVENOX ) injection 40 mg (has no administration in time range)  0.9 %  sodium chloride  infusion (has no administration in time range)  ondansetron  (ZOFRAN ) tablet 4 mg (has no administration in time range)    Or  ondansetron  (ZOFRAN ) injection 4 mg (has no administration in time range)  lactated ringers  bolus 500 mL (500 mLs Intravenous New Bag/Given 11/09/24 1845)  iohexol  (OMNIPAQUE ) 350 MG/ML injection 75 mL (58 mLs Intravenous Contrast Given 11/09/24 2157)                                    Medical Decision Making This patient presents to the ED for concern of multiple complaints, this involves an extensive number of treatment options, and is a complaint that carries with it a high risk of complications and morbidity.  The differential diagnosis includes symptomatic bradycardia, orthostatic hypotension, arrhythmia/dysrhythmia, ACS, dehydration, stroke versus TIA, electrolyte disturbance, hypoglycemia   Co morbidities that complicate the patient evaluation  History of CVA   Additional history obtained:  Additional history obtained from  record review External records from outside source obtained and reviewed including prior cardiology note   Lab Tests:  I Ordered, and personally interpreted labs.  The pertinent results include: CBC within normal limits.  BMP notable for creatinine of 1.38, this is elevated above her most recent baseline of 1.07.  Initial troponin 30, repeat flat at 26.  D-dimer notably elevated at 2.93, has been similarly elevated in the past.  Urinalysis notable for small leukocytes with rare bacteria, low suspicion for urinary tract infection but will send for culture.   Imaging Studies ordered:  I ordered imaging studies including CT head, CXR, CTA I independently visualized and interpreted imaging which showed  - CT head: 1. No acute intracranial abnormality. - CXR: 1. No acute cardiopulmonary abnormality. - CTA: 1. No pulmonary embolism. 2. Right lower lobe basilar 6 x 4 mm pulmonary nodule - no further follow-up indicated. 3. Small to moderate volume hiatal hernia with associated surgical changes along the gastric wall. 4. Cholelithiasis with no evidence of acute cholecystitis. 5. Colonic diverticulosis without acute diverticulitis.  I agree with the radiologist interpretation   Cardiac Monitoring: / EKG:  The patient was maintained on a cardiac monitor.  I personally viewed and interpreted the cardiac monitored which showed an underlying rhythm of: Sinus bradycardia   Consultations Obtained:  I requested consultation with the cardiology and hospitalist,  and discussed lab and imaging findings as well as pertinent plan - they recommend: I spoke with Dr. Cesario with cardiology, she was able to review the results of the patient's labs as well as her EKG, based on this alone she does not feel that patient's symptoms are necessarily cardiac in nature, however patient would likely benefit from overnight observation and cardiology can consult on this patient in the morning to interrogate her loop recorder.   I spoke with the hospitalist, Dr. Sim, who agrees to admit the patient for observation in consultation with cardiology   Problem List / ED Course / Critical interventions / Medication management  I ordered medication including LR bolus for elevated Cr above baseline / concern for hypovolemia   I have reviewed the patients home medicines and have made adjustments as needed   Test / Admission - Considered:  Physical exam is largely unremarkable as above, patient does have diminished strength in her upper extremities bilaterally and reports a sensory deficit in her right lower extremity as compared to the left, otherwise no focal neurologic deficits on exam, low suspicion for stroke/TIA at this time. History of similar symptoms and has been worked up for syncope in the past, is followed by cardiology and has an implantable loop recorder in place, has been told at 1 point in time that she may benefit from a pacemaker but this was never done.  Patient did endorse chest tightness earlier in the day when her symptoms began as well as when she arrived to the emergency department, reports that she is asymptomatic at this time.  Denies shortness of breath. Orthostatic vitals obtained by nursing staff as follows: Orthostatic Lying BP- Lying: 156/64Pulse- Lying: 51 Orthostatic Sitting BP- Sitting: 164/69Pulse- Sitting: 54 Orthostatic Standing at 0 minutes BP- Standing at 0 minutes: 144/65  Not reflective of orthostatic hypotension as above. Labs are notable as above, initial troponin elevated at 30 with repeat of 26. EKG without acute ischemic changes.  Currently, patient is not experiencing any chest discomfort.  D-dimer notably elevated, has been elevated similarly in the past, patient does not have a history of DVT/PE but will proceed with CTA of the chest to assess for PE as contributing to her symptoms today.  CTA of the chest is negative for pulmonary embolism today, see above for detailed  findings. Loop recorder interrogation was attempted but unable to be completed. I spoke with cardiology, low suspicion for cardiac etiology contributing to patient's symptoms today, however they do recommend admission for observation with plan to likely interrogate her loop recorder in the morning, may need to call in a rep to do so.  Cardiology is in agreement to consult on this patient's  care.  Patient admitted under the hospitalist service for observation.   Staffed with Dr. Rogelia  Amount and/or Complexity of Data Reviewed Labs: ordered. Radiology: ordered.  Risk Prescription drug management. Decision regarding hospitalization.        Final diagnoses:  Postural dizziness with presyncope  Bradycardia  Chest pain, unspecified type    ED Discharge Orders     None          Glendia Rocky LOISE DEVONNA 11/09/24 2324    Rogelia Jerilynn RAMAN, MD 11/13/24 1440  "

## 2024-11-09 NOTE — H&P (Signed)
 " History and Physical    Patient: [name] FMW:[id number] DOB: 11-17-1940 DOA: 11/09/2024 DOS: the patient was seen and examined on 11/09/2024 PCP: Roanna Ezekiel [name], MD  Patient coming from: {Point_of_Origin:[address]}  Chief Complaint:  Chief Complaint  Patient presents with   Near Syncope   Chest Pain   HPI: [name] is a 84 y.o. female with medical history significant of ***  Review of Systems: {ROS_Text:26778} Past Medical History:  Diagnosis Date   Allergy    Anal or rectal pain    sometimes   Anemia    hx of during pregnancy   Anxiety    Arthritis    Asthma    hx of   Bradycardia     I KNOW I HAVE BRADYCARDIA ESPECIALLY WHEN I SLEEP    Carotid artery occlusion    Cataract 2021   bilateral eyes   Chronic back pain    Degenerative joint disease    osteo   Depression    Diabetes mellitus without complication (HCC)    DM type II   Diverticulosis 2003   Dysrhythmia    hx of  due to eye drop and also low heart rate 40's per pt. Dr. Court follows   Elevated total protein    Esophageal dysmotility    Fibromyalgia    GERD (gastroesophageal reflux disease)    subsequent Nissen Fundoplication   Glaucoma    Hearing loss    Heart murmur    was told she had a heart murmur   Hemorrhoids    Hiatal hernia 11/08/2009   Hx of adenomatous colonic polyps 07/02/2002   Hypercalcemia    Hyperlipidemia    Hyperlipidemia    Hypertension    Implantable loop recorder present 11/28/2021   Nausea    Osteoporosis    PONV (postoperative nausea and vomiting)    Rectal bleeding    from hemorrhoids.     Sleep apnea    DOES USE CPAP    Stroke (HCC)    Thrombocytopenia    Varicose veins of left lower extremity    Past Surgical History:  Procedure Laterality Date   CARDIAC CATHETERIZATION  03/14/1992   Normal cardiac cath. Normal LV function.   CARDIOVASCULAR STRESS TEST  01/22/2011   No scintigraphic evidence of inducible ischemia.   CAROTID DOPPLER  03/31/2007    Bilateral ICAs - no evidence of significant diameter reduction, dissectin, tortuosity, FMD, or any other vascular abnormality.   CATARACT EXTRACTION, BILATERAL     ESOPHAGEAL MANOMETRY N/A 11/14/2015   Procedure: ESOPHAGEAL MANOMETRY (EM);  Surgeon: Gustav Shila GAILS, MD;  Location: [facility];  Service: Endoscopy;  Laterality: N/A;   ESOPHAGEAL MANOMETRY N/A 01/18/2021   Procedure: ESOPHAGEAL MANOMETRY (EM);  Surgeon: Shila Gustav GAILS, MD;  Location: [facility];  Service: Endoscopy;  Laterality: N/A;   ESOPHAGOGASTRODUODENOSCOPY N/A 03/08/2021   Procedure: ESOPHAGOGASTRODUODENOSCOPY (EGD);  Surgeon: Shyrl Linnie KIDD, MD;  Location: [facility] OR;  Service: Thoracic;  Laterality: N/A;   EYE SURGERY     bilateral cataracts; bilateral stents   FOOT BONE EXCISION     GASTRIC RESECTION  2009   GLAUCOMA SURGERY     JOINT REPLACEMENT     right knee Dr. Melodi 06-23-18   KNEE SURGERY Bilateral    NISSEN FUNDOPLICATION  2000   with subsequent takedown in 2009   TOTAL KNEE ARTHROPLASTY Left 06/10/2017   Procedure: LEFT  TOTAL KNEE ARTHROPLASTY;  Surgeon: Melodi Lerner, MD;  Location: [facility];  Service:  Orthopedics;  Laterality: Left;  Adductor Block   TOTAL KNEE ARTHROPLASTY Right 06/23/2018   Procedure: RIGHT TOTAL KNEE ARTHROPLASTY;  Surgeon: Melodi Lerner, MD;  Location: [facility];  Service: Orthopedics;  Laterality: Right;   TRANSCAROTID ARTERY REVASCULARIZATION  Right 08/09/2023   Procedure: TRANSCAROTID ARTERY REVASCULARIZATION;  Surgeon: Magda Debby SAILOR, MD;  Location: [facility] OR;  Service: Vascular;  Laterality: Right;   TRANSTHORACIC ECHOCARDIOGRAM  12/21/2010   EF 60%, moderate LVH,    TUBAL LIGATION     XI ROBOTIC ASSISTED HIATAL HERNIA REPAIR N/A 03/08/2021   Procedure: XI ROBOTIC ASSISTED LAPAROSCOPY WITH LYSIS OF ADHESIONS;  Surgeon: Shyrl Linnie KIDD, MD;  Location: [facility] OR;  Service: Thoracic;  Laterality: N/A;   Social History:  reports that she has never smoked. She has never  used smokeless tobacco. She reports that she does not drink alcohol  and does not use drugs.  Allergies[1]  Family History  Problem Relation Age of Onset   Heart disease Mother    Hypertension Mother    Diabetes Mother    Diabetes Father    Kidney disease Father    Stroke Brother    Cancer Maternal Grandmother    Kidney disease Maternal Grandfather    Breast cancer Daughter    Stomach cancer Maternal Aunt    Uterine cancer Maternal Aunt    Leukemia Maternal Uncle    Prostate cancer Maternal Uncle    Colon cancer Neg Hx    Rectal cancer Neg Hx    Esophageal cancer Neg Hx     Prior to Admission medications  Medication Sig Start Date End Date Taking? Authorizing Provider  acetaminophen  (TYLENOL ) 500 MG tablet Take 1,000 mg by mouth 2 (two) times daily as needed for headache (back and knee pain).    [provider]  albuterol  (VENTOLIN  HFA) 108 (90 Base) MCG/ACT inhaler Inhale 1 puff into the lungs as needed for wheezing or shortness of breath. 01/29/22   [provider]  amLODipine  (NORVASC ) 5 MG tablet Take 5 mg by mouth daily. 05/19/24   [provider]  ascorbic acid  (VITAMIN C ) 500 MG tablet Take 1 tablet (500 mg total) by mouth daily. 10/23/22   Amin, Sumayya, MD  brimonidine  (ALPHAGAN ) 0.2 % ophthalmic solution Place 1 drop into both eyes 2 (two) times daily. 09/26/22   [provider]  cetirizine (ZYRTEC) 10 MG tablet Take 10 mg by mouth at bedtime.    [provider]  Cholecalciferol  (VITAMIN D3) 250 MCG (10000 UT) capsule Take 10,000 Units by mouth daily.    [provider]  clopidogrel  (PLAVIX ) 75 MG tablet Take 1 tablet (75 mg total) by mouth daily. 08/08/23   Dennise Lavada POUR, MD  dexlansoprazole  (DEXILANT ) 60 MG capsule Take 1 capsule (60 mg total) by mouth daily. 04/03/24   Beather Delon Gibson, PA  Evolocumab  (REPATHA  SURECLICK) 140 MG/ML SOAJ Inject 140 mg into the skin every 14 (fourteen) days. 01/23/24     ezetimibe   (ZETIA ) 10 MG tablet Take 1 tablet (10 mg total) by mouth daily. Patient not taking: Reported on 08/26/2024 08/09/23   Singh, Prashant K, MD  hydrocortisone  (ANUSOL -HC) 2.5 % rectal cream Place 1 Application rectally 2 (two) times daily. Patient not taking: Reported on 08/26/2024 04/03/24   Beather Delon Gibson, PA  hydrocortisone  (ANUSOL -HC) 25 MG suppository Place 1 suppository (25 mg total) rectally at bedtime. Patient not taking: Reported on 08/26/2024 01/09/23   Kennedy-Smith, Colleen M, NP  indomethacin  (INDOCIN ) 50 MG capsule PLEASE SEE  ATTACHED FOR DETAILED DIRECTIONS 12/04/23   Rosemarie Eather RAMAN, MD  ketoconazole  (NIZORAL ) 2 % cream Apply 1 Application topically daily. 05/17/23   Joya Stabs, DPM  latanoprost  (XALATAN ) 0.005 % ophthalmic solution Place 1 drop into the right eye at bedtime. 06/10/23   [provider]  linaclotide  (LINZESS ) 290 MCG CAPS capsule Take 1 capsule (290 mcg total) by mouth daily before breakfast. 04/03/24   Beather Delon Gibson, PA  losartan  (COZAAR ) 50 MG tablet SMARTSIG:1 Tablet(s) By Mouth Morning-Night Patient not taking: Reported on 08/26/2024 11/06/23   [provider]  metFORMIN (GLUCOPHAGE) 500 MG tablet Take 500 mg by mouth every morning. Patient not taking: Reported on 08/26/2024 08/01/23   [provider]  mirtazapine (REMERON) 7.5 MG tablet Take 7.5 mg by mouth at bedtime. Patient not taking: Reported on 08/26/2024 09/16/23   [provider]  montelukast  (SINGULAIR ) 10 MG tablet Take 10 mg by mouth at bedtime.    [provider]  nystatin ointment (MYCOSTATIN) Apply 1 application  topically See admin instructions. 1 application 1-2 times a day as needed for rash Patient not taking: Reported on 08/26/2024 11/20/16   [provider]  OZEMPIC, 0.25 OR 0.5 MG/DOSE, 2 MG/3ML SOPN SMARTSIG:0.25 Milligram(s) SUB-Q Once a Week 12/03/23   [provider]  Polyethyl Glycol-Propyl Glycol (SYSTANE OP)  Place 1 drop into both eyes daily as needed (dry/irritated eyes).    [provider]  rosuvastatin  (CRESTOR ) 40 MG tablet Take 40 mg by mouth at bedtime.    [provider]  senna-docusate (SENOKOT-S) 8.6-50 MG tablet Take 2 tablets by mouth 2 (two) times daily. Patient not taking: Reported on 08/26/2024 08/12/23   Verdene Purchase, MD  sertraline  (ZOLOFT ) 100 MG tablet Take 100 mg by mouth every morning. 03/29/22   [provider]  sucralfate  (CARAFATE ) 1 g tablet Take 1 tablet (1 g total) by mouth 2 (two) times daily as needed. Do not take within 2 hours of any other medications. 04/03/24   Beather Delon Gibson, PA  telmisartan (MICARDIS) 80 MG tablet Take 80 mg by mouth every morning. 09/27/23   [provider]  zinc  sulfate 220 (50 Zn) MG capsule Take 1 capsule (220 mg total) by mouth daily. 10/23/22   Caleen Qualia, MD    Physical Exam: Vitals:   11/09/24 2000 11/09/24 2100 11/09/24 2200 11/09/24 2300  BP: (!) 169/73 (!) 162/62 (!) 168/56 (!) 165/61  Pulse: (!) 48   (!) 52  Resp: (!) 21 (!) 28 19 (!) 21  Temp: 98.2 F (36.8 C)   98 F (36.7 C)  TempSrc: Oral   Oral  SpO2: 100%   100%   *** Data Reviewed: {Tip this will not be part of the note when signed- Document your independent interpretation of telemetry tracing, EKG, lab, Radiology test or any other diagnostic tests. Add any new diagnostic test ordered today. (Optional):26781} {Results:26384}  Assessment and Plan: No notes have been filed under this hospital service. Service: Hospitalist     Advance Care Planning:   Code Status: Full Code ***  Consults: ***  Family Communication: ***  Severity of Illness: {Observation/Inpatient:21159}  AuthorBETHA SIM KNOLL, MD 11/09/2024 11:18 PM  For on call review [email or web address].     [1]  Allergies Allergen Reactions   Adalat [Nifedipine] Other (See Comments)    Tired, Made pt feel crazy in the head   Hydrocodone  Itching and Nausea And  Vomiting   Oxycodone -Acetaminophen  Itching   Meperidine Hcl Other (See Comments)  Demerol causes hallucinations   Naproxen Nausea And Vomiting   "

## 2024-11-09 NOTE — ED Provider Triage Note (Signed)
 Emergency Medicine Provider Triage Evaluation Note  [name] , a 84 y.o. female  was evaluated in triage.  Pt complains of syncope.  Patient reports between 130 and 145 she started to get lightheaded, had a headache, got dizzy, started to have chest pain, felt weakness in both extremities and felt like she passed out.  Since arriving to the ER she reports her headache is less and has less chest pain.  She reports still feeling a little bit weak.  She felt like her blood pressure did drop into the 80s whenever this happened.  Patient was well-appearing and in no acute distress while in triage.  Review of Systems  Positive: Syncope Negative:   Physical Exam  BP 139/60   Pulse (!) 56   Temp 98.4 F (36.9 C)   Resp 14   SpO2 95%  Gen:   Awake, no distress   Resp:  Normal effort  MSK:   Moves extremities without difficulty  Other:    Medical Decision Making  Medically screening exam initiated at 5:16 PM.  Appropriate orders placed.  [name] was informed that the remainder of the evaluation will be completed by another provider, this initial triage assessment does not replace that evaluation, and the importance of remaining in the ED until their evaluation is complete.     [name], [place] 11/09/24 1718

## 2024-11-09 NOTE — ED Notes (Signed)
 Pt has urine cup in hand. Unable to void at this moment

## 2024-11-09 NOTE — ED Notes (Signed)
 Unable to get loop recorder interrogator to connect with pt device, provider notified.

## 2024-11-10 ENCOUNTER — Encounter (HOSPITAL_COMMUNITY): Payer: Self-pay | Admitting: Internal Medicine

## 2024-11-10 ENCOUNTER — Other Ambulatory Visit (HOSPITAL_COMMUNITY): Payer: Self-pay

## 2024-11-10 ENCOUNTER — Ambulatory Visit: Admitting: Adult Health

## 2024-11-10 ENCOUNTER — Observation Stay (HOSPITAL_BASED_OUTPATIENT_CLINIC_OR_DEPARTMENT_OTHER)

## 2024-11-10 ENCOUNTER — Ambulatory Visit: Attending: Cardiology

## 2024-11-10 DIAGNOSIS — R55 Syncope and collapse: Secondary | ICD-10-CM | POA: Diagnosis not present

## 2024-11-10 LAB — COMPREHENSIVE METABOLIC PANEL WITH GFR
ALT: 8 U/L (ref 0–44)
AST: 20 U/L (ref 15–41)
Albumin: 3.9 g/dL (ref 3.5–5.0)
Alkaline Phosphatase: 94 U/L (ref 38–126)
Anion gap: 9 (ref 5–15)
BUN: 19 mg/dL (ref 8–23)
CO2: 26 mmol/L (ref 22–32)
Calcium: 9.7 mg/dL (ref 8.9–10.3)
Chloride: 104 mmol/L (ref 98–111)
Creatinine, Ser: 1.28 mg/dL — ABNORMAL HIGH (ref 0.44–1.00)
GFR, Estimated: 42 mL/min — ABNORMAL LOW
Glucose, Bld: 103 mg/dL — ABNORMAL HIGH (ref 70–99)
Potassium: 4.2 mmol/L (ref 3.5–5.1)
Sodium: 139 mmol/L (ref 135–145)
Total Bilirubin: 0.4 mg/dL (ref 0.0–1.2)
Total Protein: 6.8 g/dL (ref 6.5–8.1)

## 2024-11-10 LAB — URINE CULTURE: Culture: <10000 — AB

## 2024-11-10 LAB — ECHOCARDIOGRAM COMPLETE
AR max vel: 2.94 cm2
AV Area VTI: 2.95 cm2
AV Area mean vel: 3.51 cm2
AV Mean grad: 4 mmHg
AV Peak grad: 8.5 mmHg
Ao pk vel: 1.46 m/s
Area-P 1/2: 1.54 cm2
Calc EF: 71.3 %
Height: 70.5 in
MV VTI: 2.93 cm2
S' Lateral: 2.3 cm
Single Plane A2C EF: 69.4 %
Single Plane A4C EF: 73.6 %
Weight: 3463.87 [oz_av]

## 2024-11-10 LAB — CBC
HCT: 38.6 % (ref 36.0–46.0)
Hemoglobin: 12.3 g/dL (ref 12.0–15.0)
MCH: 31.1 pg (ref 26.0–34.0)
MCHC: 31.9 g/dL (ref 30.0–36.0)
MCV: 97.5 fL (ref 80.0–100.0)
Platelets: 141 10*3/uL — ABNORMAL LOW (ref 150–400)
RBC: 3.96 MIL/uL (ref 3.87–5.11)
RDW: 13.5 % (ref 11.5–15.5)
WBC: 6.3 10*3/uL (ref 4.0–10.5)
nRBC: 0 % (ref 0.0–0.2)

## 2024-11-10 LAB — VITAMIN B12: Vitamin B-12: 949 pg/mL — ABNORMAL HIGH (ref 180–914)

## 2024-11-10 LAB — T4, FREE: Free T4: 1.16 ng/dL (ref 0.80–2.00)

## 2024-11-10 LAB — VITAMIN D 25 HYDROXY (VIT D DEFICIENCY, FRACTURES): Vit D, 25-Hydroxy: 74.4 ng/mL (ref 30–100)

## 2024-11-10 LAB — FOLATE: Folate: >20 ng/mL

## 2024-11-10 LAB — PHOSPHORUS: Phosphorus: 3 mg/dL (ref 2.5–4.6)

## 2024-11-10 LAB — MAGNESIUM: Magnesium: 2 mg/dL (ref 1.7–2.4)

## 2024-11-10 LAB — TSH: TSH: 1.31 u[IU]/mL (ref 0.350–4.500)

## 2024-11-10 LAB — CBG MONITORING, ED: Glucose-Capillary: 105 mg/dL — ABNORMAL HIGH (ref 70–99)

## 2024-11-10 MED ORDER — ROSUVASTATIN CALCIUM 20 MG PO TABS: 40.0000 mg | ORAL_TABLET | Freq: Every day | ORAL | Status: DC

## 2024-11-10 MED ORDER — SERTRALINE HCL 100 MG PO TABS
100.0000 mg | ORAL_TABLET | Freq: Every morning | ORAL | Status: DC
  Administered 2024-11-10: 100 mg via ORAL
  Filled 2024-11-10: qty 1

## 2024-11-10 MED ORDER — TRAZODONE HCL 50 MG PO TABS
50.0000 mg | ORAL_TABLET | Freq: Every day | ORAL | 0 refills | Status: AC
  Filled 2024-11-10: qty 30, 30d supply, fill #0

## 2024-11-10 MED ORDER — AMLODIPINE BESYLATE 5 MG PO TABS
5.0000 mg | ORAL_TABLET | Freq: Every day | ORAL | Status: DC
  Administered 2024-11-10: 5 mg via ORAL
  Filled 2024-11-10: qty 1

## 2024-11-10 NOTE — ED Notes (Signed)
 [employer] report received and given to RN.

## 2024-11-10 NOTE — Discharge Summary (Signed)
 [facility] Discharge Summary   Patient: [name] FMW:[id number]  PCP: Roanna Ezekiel [name], MD  Date of admission: 11/09/2024   Date of discharge:  11/10/2024     Discharge Diagnoses:  Principal Problem:   Syncope and collapse Active Problems:   Essential hypertension   Sinus bradycardia   HLD (hyperlipidemia)   Depression   Sleep apnea   Carotid stenosis, asymptomatic, right   Admitted From: Home Disposition:  Home   Recommendations for Outpatient Follow-up:  Follow-up with PCP in 1 week Continue to monitor BP at home and titrate medications accordingly. May take telmisartan 40 mg if systolic BP <155 mmHg and take 80 mg if systolic BP >170 Similarly may take amlodipine  2.5 mg if systolic BP <155 and take 5 mg if systolic BP >170 Follow-up with cardiology as an outpatient in 1 week Follow up LABS/TEST:  as above   Follow-up Information     Bakare, Mobolaji B, MD Follow up in 1 week(s).   Specialty: Internal Medicine Contact information: [address] [place] [address] [phone]         Tobb, Kardie, DO Follow up in 1 week(s).   Specialty: Cardiology Contact information: [address] [place] [address] [phone]                Diet recommendation: Cardiac diet  Activity: The patient is advised to gradually reintroduce usual activities, as tolerated  Discharge Condition: stable  Code Status: Full code   History of present illness: As per the H and P dictated on admission. Hospital Course:  From H&P [name] is a 84 y.o. female with medical history significant of recurrent syncope and bradycardia, essential hypertension, hyperlipidemia, depression, obstructive sleep apnea, right carotid stenosis, GERD and fibromyalgia who has been having issues with syncope and presyncope at home.  Patient has a loop recorder in place.  Came to the ER complaining another episode of presyncope.  Patient was noted to be  bradycardic with heart rate in the 50s.  Symptoms have been on and off but today was severe she almost fell.  Daughter accompanying patient states she has had some diaphoresis.  Did not pass out and did not hit her head.  Attempted loop recorder interrogation in the ER was unsuccessful.  Cardiology consulted and recommended admission for observation and loop recorder will be interrogated in the morning.  Patient is therefore being admitted for management of presyncope.  Patient did have chest pain but transient.  No EKG change and no bump in troponins.    Assessment and Plan: # Presyncope: Most likely due to orthostatic hypotension.  As per patient she had blood pressure 80/50 and heart rate was 52 at home when she had presyncopal episode.  Patient has a loop recorder, interrogation was done and as per cardiology heart rate went down to 59 while she was sleeping which is normal, no any pauses.  Less likely presyncope secondary to bradycardia most likely it is secondary to orthostatic hypotension.  Cardiology recommended to discharge the patient if patient remains stable and no symptoms.  RN was advised to ambulate patient and patient remained asymptomatic so patient was discharged home.  Patient wanted to be discharged home, she does not want to stay another night in the hospital for monitoring, she was comfortable to go home and her family also agreed for the discharge planning.   # Essential hypertension: Blood pressure is controlled.  Patient was not orthostatic in the  hospital.  Resumed home med amlodipine  5 mg nightly and telmisartan 80 mg  p.o. daily.  Patient was advised to monitor BP at home and titrate medication accordingly.  Keep BP on the higher side to prevent orthostatic symptoms, take telmisartan 80 mg if SBP >170 and take 40 mg if SBP > 155.  Take amlodipine  5 mg if SBP >170 and take 2.5 mg if SBP >155, to avoid hypotension.  May use abdominal binder and compression stockings for orthostatic  symptoms. Follow-up with PCP for management as an outpatient  # Hyperlipidemia: Continued Repatha  and Crestor  Home meds # Depression: Continued Zoloft  and trazodone . # Obstructive sleep apnea: Continue CPAP. # GERD: Continue PPIs    Body mass index is 30.62 kg/m.  Nutrition Interventions:  - Patient was instructed, not to drive, operate heavy machinery, perform activities at heights, swimming or participation in water  activities or provide baby sitting services while on Pain, Sleep and Anxiety Medications; until her outpatient Physician has advised to do so again.  - Also recommended to not to take more than prescribed Pain, Sleep and Anxiety Medications.  Patient was ambulatory without any assistance.  On the day of the discharge the patient's vitals were stable, and no other acute medical condition were reported by patient. the patient was felt safe to be discharge at Home .  Consultants: Cardiology was consulted but patient was not seen as they recommended symptoms are secondary to orthostatic hypotension so patient can follow-up with cardiology as an outpatient. Procedures: None  Discharge Exam: General: Appear in no distress, Oral Mucosa Clear, moist. Cardiovascular: S1 and S2 Present, no Murmur, Respiratory: normal respiratory effort, Bilateral Air entry present and no Crackles, no wheezes Abdomen: Bowel Sound present, Soft and no tenderness. Extremities: no Pedal edema, no calf tenderness Neurology: alert and oriented to time, place, and person affect appropriate.  Filed Weights   11/10/24 0131  Weight: 98.2 kg   Vitals:   11/10/24 1245 11/10/24 1300  BP: (!) 143/54 (!) 148/73  Pulse: (!) 54 65  Resp: 18 13  Temp:    SpO2: 99% 100%    DISCHARGE MEDICATION: Allergies as of 11/10/2024       Reactions   Adalat [nifedipine] Other (See Comments)   Tired, Made pt feel crazy in the head   Hydrocodone  Itching, Nausea And Vomiting   Meperidine Hcl Other (See  Comments)   Demerol causes hallucinations   Tape Itching, Other (See Comments)   PAPER TAPE is fine; cause redness and some blistering   Naproxen Nausea And Vomiting   Oxycodone -acetaminophen  Nausea And Vomiting        Medication List     TAKE these medications    acetaminophen  500 MG tablet Commonly known as: TYLENOL  Take 1,000 mg by mouth 2 (two) times daily as needed for headache (back and knee pain).   amLODipine  5 MG tablet Commonly known as: NORVASC  Take 5 mg by mouth daily.   ascorbic acid  100 MG tablet Commonly known as: VITAMIN C  Take 100 mg by mouth daily.   brimonidine  0.2 % ophthalmic solution Commonly known as: ALPHAGAN  Place 1 drop into both eyes 2 (two) times daily.   cetirizine 10 MG tablet Commonly known as: ZYRTEC Take 10 mg by mouth at bedtime.   clopidogrel  75 MG tablet Commonly known as: PLAVIX  Take 1 tablet (75 mg total) by mouth daily.   dexlansoprazole  60 MG capsule Commonly known as: DEXILANT  Take 1 capsule (60 mg total) by mouth daily.   latanoprost   0.005 % ophthalmic solution Commonly known as: XALATAN  Place 1 drop into the right eye at bedtime.   linaclotide  290 MCG Caps capsule Commonly known as: LINZESS  Take 1 capsule (290 mcg total) by mouth daily before breakfast.   montelukast  10 MG tablet Commonly known as: SINGULAIR  Take 10 mg by mouth at bedtime.   Ozempic (0.25 or 0.5 MG/DOSE) 2 MG/3ML Sopn Generic drug: Semaglutide(0.25 or 0.5MG /DOS) Inject 0.25 mg into the skin every Sunday.   Repatha SureClick 140 MG/ML Soaj Generic drug: Evolocumab Inject 140 mg into the skin every 14 (fourteen) days.   rosuvastatin 40 MG tablet Commonly known as: CRESTOR Take 40 mg by mouth at bedtime.   sertraline 100 MG tablet Commonly known as: ZOLOFT Take 100 mg by mouth every morning.   sucralfate 1 g tablet Commonly known as: Carafate Take 1 tablet (1 g total) by mouth 2 (two) times daily as needed. Do not take within 2 hours of  any other medications. What changed: when to take this   SYSTANE OP Place 1 drop into both eyes daily as needed (dry/irritated eyes).   telmisartan 80 MG tablet Commonly known as: MICARDIS Take 80 mg by mouth every morning.   traZODone 50 MG tablet Commonly known as: DESYREL Take 1 tablet (50 mg total) by mouth at bedtime. May take 25 mg if feels sleepy all day or may take 75 to 100 mg if feels 50 mg is not helping he sleeping   Vitamin D3 250 MCG (10000 UT) capsule Take 10,000 Units by mouth daily.   zinc gluconate 50 MG tablet Take 50 mg by mouth daily.       Allergies[1] Discharge Instructions     Call MD for:   Complete by: As directed    Persistent orthostatics or bradycardia.   Call MD for:  difficulty breathing, headache or visual disturbances   Complete by: As directed    Call MD for:  extreme fatigue   Complete by: As directed    Call MD for:  persistant dizziness or light-headedness   Complete by: As directed    Call MD for:  severe uncontrolled pain   Complete by: As directed    Call MD for:  temperature >100.4   Complete by: As directed    Discharge instructions   Complete by: As directed    Follow-up with PCP in 1 week Continue to monitor BP at home and titrate medications accordingly. May take telmisartan 40 mg if systolic BP <155 mmHg and take 80 mg if systolic BP >170 Similarly may take amlodipine 2.5 mg if systolic BP <155 and take 5 mg if systolic BP >170 Follow-up with cardiology as an outpatient in 1 week   Increase activity slowly   Complete by: As directed        The results of significant diagnostics from this hospitalization (including imaging, microbiology, ancillary and laboratory) are listed below for reference.    Significant Diagnostic Studies: ECHOCARDIOGRAM COMPLETE Result Date: 11/10/2024    ECHOCARDIOGRAM REPORT   Patient Name:   [name] Date of Exam: 11/10/2024 Medical Rec #:  [id number]   Height:       70.5 in Accession #:     [id number]  Weight:       21 6.5 lb Date of Birth:  [birth year]  BSA:          2.170 m Patient Age:    83 years    BP:  154/69 mmHg Patient Gender: F           HR:           62 bpm. Exam Location:  Inpatient Procedure: 2D Echo, Cardiac Doppler and Color Doppler (Both Spectral and Color            Flow Doppler were utilized during procedure). Indications:    Syncope  History:        Patient has prior history of Echocardiogram examinations, most                 recent 08/07/2023. Risk Factors:Hypertension and Dyslipidemia.  Sonographer:    Odella Brewster Referring Phys: [id number] EMERY LITTIE FUSS  Sonographer Comments: Image acquisition challenging due to respiratory motion. IMPRESSIONS  1. Left ventricular ejection fraction, by estimation, is 65 to 70%. The left ventricle has hyperdynamic function. The left ventricle has no regional wall motion abnormalities. There is moderate concentric left ventricular hypertrophy. Left ventricular diastolic parameters are consistent with Grade I diastolic dysfunction (impaired relaxation).  2. Right ventricular systolic function is normal. The right ventricular size is normal.  3. The mitral valve is normal in structure. Trivial mitral valve regurgitation. No evidence of mitral stenosis.  4. The aortic valve is tricuspid. There is mild thickening of the aortic valve. Aortic valve regurgitation is not visualized. Aortic valve sclerosis is present, with no evidence of aortic valve stenosis. Comparison(s): No significant change from prior study. Prior images reviewed side by side. FINDINGS  Left Ventricle: Left ventricular ejection fraction, by estimation, is 65 to 70%. The left ventricle has hyperdynamic function. The left ventricle has no regional wall motion abnormalities. The left ventricular internal cavity size was normal in size. There is moderate concentric left ventricular hypertrophy. Left ventricular diastolic parameters are consistent with Grade I diastolic dysfunction  (impaired relaxation). Right Ventricle: The right ventricular size is normal. No increase in right ventricular wall thickness. Right ventricular systolic function is normal. Left Atrium: Left atrial size was normal in size. Right Atrium: Right atrial size was normal in size. Pericardium: Trivial pericardial effusion is present. Presence of epicardial fat layer. Mitral Valve: The mitral valve is normal in structure. Mild mitral annular calcification. Trivial mitral valve regurgitation. No evidence of mitral valve stenosis. MV peak gradient, 3.5 mmHg. The mean mitral valve gradient is 1.0 mmHg. Tricuspid Valve: The tricuspid valve is normal in structure. Tricuspid valve regurgitation is mild. Aortic Valve: The aortic valve is tricuspid. There is mild thickening of the aortic valve. Aortic valve regurgitation is not visualized. Aortic valve sclerosis is present, with no evidence of aortic valve stenosis. Aortic valve mean gradient measures 4.0  mmHg. Aortic valve peak gradient measures 8.5 mmHg. Aortic valve area, by VTI measures 2.95 cm. Pulmonic Valve: The pulmonic valve was grossly normal. Pulmonic valve regurgitation is not visualized. No evidence of pulmonic stenosis. Aorta: The aortic root is normal in size and structure. IAS/Shunts: No atrial level shunt detected by color flow Doppler.  LEFT VENTRICLE PLAX 2D LVIDd:         3.50 cm      Diastology LVIDs:         2.30 cm      LV e' medial:    6.09 cm/s LV PW:         1.70 cm      LV E/e' medial:  10.2 LV IVS:        1.40 cm      LV e' lateral:   8.38 cm/s  LVOT diam:     2.20 cm      LV E/e' lateral: 7.4 LV SV:         84 LV SV Index:   39 LVOT Area:     3.80 cm LV IVRT:       109 msec  LV Volumes (MOD) LV vol d, MOD A2C: 93.9 ml LV vol d, MOD A4C: 114.0 ml LV vol s, MOD A2C: 28.7 ml LV vol s, MOD A4C: 30.1 ml LV SV MOD A2C:     65.2 ml LV SV MOD A4C:     114.0 ml LV SV MOD BP:      74.7 ml RIGHT VENTRICLE RV S prime:     15.80 cm/s  PULMONARY VEINS TAPSE  (M-mode): 2.2 cm      Diastolic Velocity: 46.40 cm/s                             S/D Velocity:       1.20                             Systolic Velocity:  56.80 cm/s LEFT ATRIUM             Index        RIGHT ATRIUM           Index LA diam:        4.20 cm 1.94 cm/m   RA Area:     12.40 cm LA Vol (A2C):   54.3 ml 25.02 ml/m  RA Volume:   23.80 ml  10.97 ml/m LA Vol (A4C):   52.0 ml 23.96 ml/m LA Biplane Vol: 53.8 ml 24.79 ml/m  AORTIC VALVE                    PULMONIC VALVE AV Area (Vmax):    2.94 cm     PV Vmax:       1.31 m/s AV Area (Vmean):   3.51 cm     PV Peak grad:  6.9 mmHg AV Area (VTI):     2.95 cm AV Vmax:           146.00 cm/s AV Vmean:          93.800 cm/s AV VTI:            0.286 m AV Peak Grad:      8.5 mmHg AV Mean Grad:      4.0 mmHg LVOT Vmax:         113.00 cm/s LVOT Vmean:        86.700 cm/s LVOT VTI:          0.222 m LVOT/AV VTI ratio: 0.78  AORTA Ao Root diam: 3.60 cm Ao Asc diam:  3.80 cm MITRAL VALVE               TRICUSPID VALVE MV Area (PHT): 1.54 cm    TR Peak grad:   25.0 mmHg MV Area VTI:   2.93 cm    TR Vmax:        250.00 cm/s MV Peak grad:  3.5 mmHg MV Mean grad:  1.0 mmHg    SHUNTS MV Vmax:       0.93 m/s    Systemic VTI:  0.22 m MV Vmean:      53.6 cm/s   Systemic Diam: 2.20 cm MV Decel Time: 492  msec MV E velocity: 62.40 cm/s MV A velocity: 84.90 cm/s MV E/A ratio:  0.73 Mihai Croitoru MD Electronically signed by Jerel Balding MD Signature Date/Time: 11/10/2024/10:39:04 AM    Final    CT Angio Chest PE W and/or Wo Contrast Result Date: 11/09/2024 EXAM: CTA of the Chest with contrast for PE 11/09/2024 10:06:16 PM TECHNIQUE: CTA of the chest was performed after the administration of 58 mL of iohexol  (OMNIPAQUE ) 350 MG/ML injection. Multiplanar reformatted images are provided for review. MIP images are provided for review. Automated exposure control, iterative reconstruction, and/or weight based adjustment of the mA/kV was utilized to reduce the radiation dose to as low as  reasonably achievable. COMPARISON: None available. CLINICAL HISTORY: Elevated d-dimer, presyncopal today with chest tightness. FINDINGS: PULMONARY ARTERIES: Pulmonary arteries are adequately opacified for evaluation. No pulmonary embolism. Main pulmonary artery is normal in caliber. MEDIASTINUM: Coronary artery calcifications and mitral annular calcification are present. The pericardium demonstrates no acute abnormality. The thoracic aorta is normal in caliber. There is moderate atherosclerotic plaque. LYMPH NODES: No mediastinal, hilar or axillary lymphadenopathy. LUNGS AND PLEURA: Right lower lobe basilar 6 x 4 mm pulmonary nodule (5.84). No focal consolidation or pulmonary edema. No pleural effusion or pneumothorax. UPPER ABDOMEN: Small to moderate volume hiatal hernia. Associated surgical changes along the gastric wall. Calcified gallstone within the gallbladder lumen. No gallbladder wall thickening or pericholecystic fluid. Colonic diverticulosis. SOFT TISSUES AND BONES: Severe degenerative change of the right shoulder. No acute soft tissue abnormality. IMPRESSION: 1. No pulmonary embolism. 2. Right lower lobe basilar 6 x 4 mm pulmonary nodule - no further follow-up indicated. 3. Small to moderate volume hiatal hernia with associated surgical changes along the gastric wall. 4. Cholelithiasis with no evidence of acute cholecystitis. 5. Colonic diverticulosis without acute diverticulitis. Electronically signed by: Morgane Naveau MD 11/09/2024 10:20 PM EST RP Workstation: [id number]   DG Chest 2 View Result Date: 11/09/2024 EXAM: 2 VIEW(S) XRAY OF THE CHEST 11/09/2024 06:05:00 PM COMPARISON: Comparison with 08/17/2023. CLINICAL HISTORY: CP Chest pain. FINDINGS: LINES, TUBES AND DEVICES: A loop recorder is present. LUNGS AND PLEURA: Lungs are clear. No pleural effusion or pneumothorax. HEART AND MEDIASTINUM: Heart size and pulmonary vascularity are normal. Mediastinal contours appear intact. Calcification of the  aorta. BONES AND SOFT TISSUES: No acute osseous abnormality. IMPRESSION: 1. No acute cardiopulmonary abnormality. Electronically signed by: Elsie Gravely MD 11/09/2024 06:10 PM EST RP Workstation: [id number]   CT HEAD WO CONTRAST Result Date: 11/09/2024 EXAM: CT HEAD WITHOUT CONTRAST 11/09/2024 05:23:00 PM TECHNIQUE: CT of the head was performed without the administration of intravenous contrast. Automated exposure control, iterative reconstruction, and/or weight based adjustment of the mA/kV was utilized to reduce the radiation dose to as low as reasonably achievable. COMPARISON: 08/07/2023. CLINICAL HISTORY: near syncopal event. FINDINGS: BRAIN AND VENTRICLES: No acute hemorrhage. No evidence of acute infarct. No extra-axial collection. No mass effect or midline shift. ORBITS: No acute abnormality. SINUSES: No acute abnormality. SOFT TISSUES AND SKULL: No acute soft tissue abnormality. No skull fracture. IMPRESSION: 1. No acute intracranial abnormality. Electronically signed by: Oneil Devonshire MD 11/09/2024 05:28 PM EST RP Workstation: [id number]    Microbiology: No results found for this or any previous visit (from the past 240 hours).   Labs: CBC: Recent Labs  Lab 11/09/24 1728 11/10/24 0228  WBC 5.9 6.3  HGB 12.6 12.3  HCT 38.7 38.6  MCV 96.0 97.5  PLT 158 141*   Basic Metabolic Panel: Recent Labs  Lab 11/09/24 1728 11/10/24 0228  NA 137 139  K 4.3 4.2  CL 102 104  CO2 23 26  GLUCOSE 87 103*  BUN 21 19  CREATININE 1.38* 1.28*  CALCIUM  9.2 9.7  MG  --  2.0  PHOS  --  3.0   Liver Function Tests: Recent Labs  Lab 11/10/24 0228  AST 20  ALT 8  ALKPHOS 94  BILITOT 0.4  PROT 6.8  ALBUMIN  3.9   No results for input(s): LIPASE, AMYLASE in the last 168 hours. No results for input(s): AMMONIA in the last 168 hours. Cardiac Enzymes: No results for input(s): CKTOTAL, CKMB, CKMBINDEX, TROPONINI in the last 168 hours. BNP (last 3 results) No results for  input(s): BNP in the last 8760 hours. CBG: Recent Labs  Lab 11/09/24 1712 11/10/24 0652  GLUCAP 84 105*    Time spent: 35 minutes  Signed:  Elvan Sor  [facility] 11/10/2024 1:27 PM      [1]  Allergies Allergen Reactions   Adalat [Nifedipine] Other (See Comments)    Tired, Made pt feel crazy in the head   Hydrocodone  Itching and Nausea And Vomiting   Meperidine Hcl Other (See Comments)    Demerol causes hallucinations   Tape Itching and Other (See Comments)    PAPER TAPE is fine; cause redness and some blistering   Naproxen Nausea And Vomiting   Oxycodone -Acetaminophen  Nausea And Vomiting

## 2024-11-10 NOTE — ED Notes (Signed)
 Called and placed PT on monitor with CCMD

## 2024-11-10 NOTE — ED Notes (Addendum)
 Attempted to interrogate loop recorder and after multiple attempts says no cardiac monitor found

## 2024-11-11 LAB — CUP PACEART REMOTE DEVICE CHECK
Date Time Interrogation Session: 20260127144516
Implantable Pulse Generator Implant Date: 20240222
Zone Setting Status: 755011
Zone Setting Status: 755011
Zone Setting Status: 755011
Zone Setting Status: 755011

## 2024-11-12 ENCOUNTER — Ambulatory Visit: Payer: Self-pay | Admitting: Cardiology

## 2024-11-16 ENCOUNTER — Encounter: Payer: Self-pay | Admitting: *Deleted

## 2024-11-17 ENCOUNTER — Ambulatory Visit: Admitting: Cardiology

## 2024-11-17 ENCOUNTER — Encounter: Payer: Self-pay | Admitting: Cardiology

## 2024-11-17 VITALS — BP 124/70 | HR 77 | Ht 70.0 in | Wt 220.8 lb

## 2024-11-17 DIAGNOSIS — R55 Syncope and collapse: Secondary | ICD-10-CM

## 2024-11-17 DIAGNOSIS — Z8673 Personal history of transient ischemic attack (TIA), and cerebral infarction without residual deficits: Secondary | ICD-10-CM | POA: Diagnosis not present

## 2024-11-17 DIAGNOSIS — E782 Mixed hyperlipidemia: Secondary | ICD-10-CM

## 2024-11-17 DIAGNOSIS — I10 Essential (primary) hypertension: Secondary | ICD-10-CM | POA: Diagnosis not present

## 2024-11-17 NOTE — Patient Instructions (Signed)
 Medication Instructions:  Your physician recommends that you continue on your current medications as directed. Please refer to the Current Medication list given to you today.  *If you need a refill on your cardiac medications before your next appointment, please call your pharmacy*  Follow-Up: At [facility], you and your health needs are our priority.  As part of our continuing mission to provide you with exceptional heart care, our providers are all part of one team.  This team includes your primary Cardiologist (physician) and Advanced Practice Providers or APPs (Physician Assistants and Nurse Practitioners) who all work together to provide you with the care you need, when you need it.  Your next appointment:   3 month(s)  Provider:   Kardie Tobb, DO     Other Instructions:

## 2024-11-18 ENCOUNTER — Ambulatory Visit: Admitting: Adult Health

## 2024-11-18 ENCOUNTER — Encounter: Payer: Self-pay | Admitting: Adult Health

## 2024-11-18 VITALS — BP 130/58 | HR 62 | Ht 70.0 in | Wt 219.8 lb

## 2024-11-18 DIAGNOSIS — Z8673 Personal history of transient ischemic attack (TIA), and cerebral infarction without residual deficits: Secondary | ICD-10-CM

## 2024-11-18 DIAGNOSIS — I6521 Occlusion and stenosis of right carotid artery: Secondary | ICD-10-CM | POA: Diagnosis not present

## 2024-11-18 NOTE — Patient Instructions (Addendum)
 Continue clopidogrel  75 mg daily, Crestor  and Repatha  for secondary stroke prevention managed/prescribed by PCP  Try to gradually increase your water  intake to at least 64 oz per day. Try to limit intake later in the evening to avoid frequent trips to that bathroom in the middle of the night  Continue to follow with cardiology as scheduled   Continue close follow-up with vascular surgery for monitoring of right carotid stent  Continue to follow up with PCP regarding blood pressure, cholesterol and diabetes management  Maintain strict control of hypertension with blood pressure goal below 130/90, diabetes with hemoglobin A1c goal below 7.0 % and cholesterol with LDL cholesterol (bad cholesterol) goal below 70 mg/dL.   Signs of a Stroke? Follow the BEFAST method:  Balance Watch for a sudden loss of balance, trouble with coordination or vertigo Eyes Is there a sudden loss of vision in one or both eyes? Or double vision?  Face: Ask the person to smile. Does one side of the face droop or is it numb?  Arms: Ask the person to raise both arms. Does one arm drift downward? Is there weakness or numbness of a leg? Speech: Ask the person to repeat a simple phrase. Does the speech sound slurred/strange? Is the person confused ? Time: If you observe any of these signs, call 911.        Thank you for coming to see us  at [facility]. I hope we have been able to provide you high quality care today.  You may receive a patient satisfaction survey over the next few weeks. We would appreciate your feedback and comments so that we may continue to improve ourselves and the health of our patients.

## 2024-11-18 NOTE — Progress Notes (Signed)
 " [facility] [address] [place]. [place] [address] [phone]       OFFICE FOLLOW-UP NOTE  [name] Date of Birth:  28-Oct-1940 Medical Record Number:  [id number]    Primary neurologist: Dr. Rosemarie Reason for visit: Stroke follow-up    HPI:   Update 11/18/2024 JM: Patient returns for follow-up visit after prior visit over 1 year ago accompanied by her husband.  Reports overall doing well since prior visit.  Spontaneous resolution of headaches and denies any recent headaches.  Unable to recall if she tried indomethacin .  Denies any new stroke/TIA symptoms.  Denies any residual stroke deficits.  She does ambulate with a rollator walker at all times, no recent falls.  Recent ED visit for syncope and presyncope episodes at home suspected related to orthostatic hypotension.  ILR interrogated without evidence of arrhythmia.  Denies any recurrent events since that time.  She is closely being followed by cardiology.  She does admit to limited water  intake despite has been trying to encourage her, she drinks typically 1 to 2 glasses of water  per day.  Reports compliance on Plavix , Crestor  and Repatha  without side effects.  Routinely follows with PCP.  Plans on follow-up with vascular surgery in May for repeat carotid ultrasound.  No questions or concerns at this time.      Initial visit 11/06/2023 Dr. Rosemarie: [name] is a 84 year old pleasant African-American lady seen today for office follow-up visit following hospital consideration for stroke in October 2024.  History is obtained from the patient and review of electronic medical records and I personally reviewed pertinent available imaging films in PACS. She has past medical history of diabetes, hypertension, hyperlipidemia, gastroesophageal flux disease and arthritis.  She presented on 08/06/2023 with intermittent episodes of left upper extremity weakness and numbness which were occurring randomly but had become more  frequent and lasting longer.  She had a couple of episodes a few weeks prior and had 2 episodes in the last 2 days prior to presentation.  Her primary care physician ordered a stat outpatient MRI which was performed which showed scattered right hemispheric watershed acute infarcts.  CT angiogram showed 70% proximal right ICA stenosis which was felt to be likely source of her TIAs and strokes.  2D echo showed ejection fraction of 65 to 70% with moderate dilatation of left atrium.  LDL cholesterol 112 mg percent.  Hemoglobin A1c of 6.8.  She started on dual antiplatelet therapy and vascular surgery was consulted who did elective right carotid TCAR procedure with revascularization on 08/09/2023.  Procedure went uneventfully.  Patient had a loop recorder which was interrogated and no evidence of paroxysmal A-fib was found but it showed rhythms consistent with a syncopal episode that she had previously had on 07/30/2023.  Patient states she is done well since discharge.  Left-sided weakness and strength are good and she has had no further episodes.  She is now complaining of some tightness at the surgical site in the neck and a pulling sensation.  She is also having some intermittent right temporal sharp pains which last few seconds and are shooting in nature.  Of late she has had a few episodes on the left as well.  She has no prior history of migraines.  She has been taking some Tylenol  with only partial relief.  She is still wearing a boot in the left ankle and has only partial weightbearing and that has been limiting her walking.  She remains on Plavix  which  is tolerating well with minor bruising and no bleeding.  Blood pressure is under good control..   ROS:   14 system review of systems is positive for those listed in HPI and all other systems negative  PMH:  Past Medical History:  Diagnosis Date   Allergy    Anal or rectal pain    sometimes   Anemia    hx of during pregnancy   Anxiety    Arthritis     Asthma    hx of   Bradycardia     I KNOW I HAVE BRADYCARDIA ESPECIALLY WHEN I SLEEP    Carotid artery occlusion    Cataract 2021   bilateral eyes   Chronic back pain    Degenerative joint disease    osteo   Depression    Diabetes mellitus without complication (HCC)    DM type II   Diverticulosis 2003   Dysrhythmia    hx of  due to eye drop and also low heart rate 40's per pt. Dr. Court follows   Elevated total protein    Esophageal dysmotility    Fibromyalgia    GERD (gastroesophageal reflux disease)    subsequent Nissen Fundoplication   Glaucoma    Hearing loss    Heart murmur    was told she had a heart murmur   Hemorrhoids    Hiatal hernia 11/08/2009   Hx of adenomatous colonic polyps 07/02/2002   Hypercalcemia    Hyperlipidemia    Hyperlipidemia    Hypertension    Implantable loop recorder present 11/28/2021   Nausea    Osteoporosis    PONV (postoperative nausea and vomiting)    Rectal bleeding    from hemorrhoids.     Sleep apnea    DOES USE CPAP    Stroke (HCC)    Thrombocytopenia    Varicose veins of left lower extremity     Social History:  Social History   Socioeconomic History   Marital status: Married    Spouse name: Not on file   Number of children: 8   Years of education: Not on file   Highest education level: Not on file  Occupational History   Occupation: retired    Comment: retired clinical biochemist.   Tobacco Use   Smoking status: Never   Smokeless tobacco: Never  Vaping Use   Vaping status: Never Used  Substance and Sexual Activity   Alcohol  use: No    Alcohol /week: 0.0 standard drinks of alcohol    Drug use: No   Sexual activity: Yes  Other Topics Concern   Not on file  Social History Narrative   Husband, Mervin Jaros is Next of Kin. Cell # [phone]      Right handed    Wear glasses drinks    No caffeine    Social Drivers of Health   Tobacco Use: Low Risk (11/18/2024)   Patient History    Smoking Tobacco Use: Never     Smokeless Tobacco Use: Never    Passive Exposure: Not on file  Financial Resource Strain: Not on file  Food Insecurity: No Food Insecurity (08/07/2023)   Hunger Vital Sign    Worried About Running Out of Food in the Last Year: Never true    Ran Out of Food in the Last Year: Never true  Transportation Needs: No Transportation Needs (08/07/2023)   PRAPARE - Administrator, Civil Service (Medical): No    Lack of Transportation (Non-Medical): No  Physical Activity:  Not on file  Stress: Not on file  Social Connections: Not on file  Intimate Partner Violence: Not At Risk (08/07/2023)   Humiliation, Afraid, Rape, and Kick questionnaire    Fear of Current or Ex-Partner: No    Emotionally Abused: No    Physically Abused: No    Sexually Abused: No  Depression (PHQ2-9): Not on file  Alcohol  Screen: Not on file  Housing: Unknown (10/19/2024)   Received from [facility]   Epic    Unable to Pay for Housing in the Last Year: Not on file    Number of Times Moved in the Last Year: Not on file    At any time in the past 12 months, were you homeless or living in a shelter (including now)?: No  Utilities: Not At Risk (08/07/2023)   [facility] Utilities    Threatened with loss of utilities: No  Health Literacy: Not on file    Medications:   Current Outpatient Medications on File Prior to Visit  Medication Sig Dispense Refill   acetaminophen  (TYLENOL ) 500 MG tablet Take 1,000 mg by mouth 2 (two) times daily as needed for headache (back and knee pain).     amLODipine  (NORVASC ) 5 MG tablet Take 5 mg by mouth daily.     ascorbic acid  (VITAMIN C ) 100 MG tablet Take 100 mg by mouth daily.     brimonidine  (ALPHAGAN ) 0.2 % ophthalmic solution Place 1 drop into both eyes 2 (two) times daily.     cetirizine (ZYRTEC) 10 MG tablet Take 10 mg by mouth at bedtime.     Cholecalciferol  (VITAMIN D3) 250 MCG (10000 UT) capsule Take 10,000 Units by mouth daily.     clopidogrel  (PLAVIX ) 75  MG tablet Take 1 tablet (75 mg total) by mouth daily. 30 tablet 1   dexlansoprazole  (DEXILANT ) 60 MG capsule Take 1 capsule (60 mg total) by mouth daily. 90 capsule 3   Evolocumab  (REPATHA  SURECLICK) 140 MG/ML SOAJ Inject 140 mg into the skin every 14 (fourteen) days. 2 mL 5   latanoprost  (XALATAN ) 0.005 % ophthalmic solution Place 1 drop into the right eye at bedtime.     linaclotide  (LINZESS ) 290 MCG CAPS capsule Take 1 capsule (290 mcg total) by mouth daily before breakfast. 90 capsule 3   montelukast  (SINGULAIR ) 10 MG tablet Take 10 mg by mouth at bedtime.     OZEMPIC, 0.25 OR 0.5 MG/DOSE, 2 MG/3ML SOPN Inject 0.25 mg into the skin every Sunday.     Polyethyl Glycol-Propyl Glycol (SYSTANE OP) Place 1 drop into both eyes daily as needed (dry/irritated eyes).     rosuvastatin  (CRESTOR ) 40 MG tablet Take 40 mg by mouth at bedtime.     sertraline  (ZOLOFT ) 100 MG tablet Take 100 mg by mouth every morning.     sucralfate  (CARAFATE ) 1 g tablet Take 1 tablet (1 g total) by mouth 2 (two) times daily as needed. Do not take within 2 hours of any other medications. (Patient taking differently: Take 1 g by mouth at bedtime. Do not take within 2 hours of any other medications.) 90 tablet 3   telmisartan (MICARDIS) 80 MG tablet Take 80 mg by mouth every morning.     traZODone  (DESYREL ) 50 MG tablet Take 1 tablet (50 mg total) by mouth at bedtime. May take 25 mg if feels sleepy all day or may take 75 to 100 mg if feels 50 mg is not helping he sleeping 30 tablet 0   zinc  gluconate 50  MG tablet Take 50 mg by mouth daily.     No current facility-administered medications on file prior to visit.    Allergies:   Allergies  Allergen Reactions   Adalat [Nifedipine] Other (See Comments)    Tired, Made pt feel crazy in the head   Hydrocodone  Itching and Nausea And Vomiting   Meperidine Hcl Other (See Comments)    Demerol causes hallucinations   Tape Itching and Other (See Comments)    PAPER TAPE is fine;  cause redness and some blistering   Naproxen Nausea And Vomiting   Oxycodone -Acetaminophen  Nausea And Vomiting    Physical Exam Today's Vitals   11/18/24 1256  BP: (!) 130/58  Pulse: 62  SpO2: 99%  Weight: 219 lb 12.8 oz (99.7 kg)  Height: 5' 10 (1.778 m)   Body mass index is 31.54 kg/m.   General: well developed, well nourished very pleasant elderly African-American lady, seated, in no evident distress  Neurologic Exam Mental Status: Awake and fully alert. Oriented to place and time. Recent and remote memory intact. Attention span, concentration and fund of knowledge appropriate. Mood and affect appropriate.  Cranial Nerves: Pupils equal, briskly reactive to light. Extraocular movements full without nystagmus. Visual fields full to confrontation. Hearing intact. Facial sensation intact.  Unable to appreciate facial weakness Motor: Normal bulk and tone. Normal strength in all tested extremity muscles except give away weakness in bilateral upper extremities ? Due to pain and decreased grip strength bilaterally  Sensory.: intact to touch ,pinprick .position and vibratory sensation.  Coordination: Rapid alternating movements mildly decreased in upper extremities. Finger-to-nose and heel-to-shin performed accurately bilaterally. Gait and Station: Arises from chair without difficulty. Stance is normal.  Uses a wheeled walker.  Slow antalgic gait.       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 Vascular risk factors of diabetes, hypertension, hyperlipidemia mild obesity.    PLAN:  Right hemispheric watershed infarcts Recovered well without any noticeable residual deficits. Suspect bilateral upper extremity weakness more due to underlying arthritis/pain Continue Plavix  75 mg daily,  Crestor  and Repatha  injection every 2 weeks for secondary stroke prevention manage/prescribed by PCP Continue close PCP follow-up for aggressive stroke risk factor management including BP goal<130/90, HLD with LDL goal<70 and DM with A1c.<7   Right carotid stenosis s/p TCAR Followed by vascular surgery Plans on repeat carotid ultrasound 02/2025, prior ultrasound 02/2024 showed patent stent  Temporal headaches Spontaneously resolved Advised to call if these should recur ESR and CRP normal    No further recommendations from neurological standpoint and is close to being followed by PCP and other specialties.  She can follow-up here on an as-needed basis but advised to call with any questions or concerns in the future.     I personally spent a total of 25 minutes in the care of the patient today including preparing to see the patient, getting/reviewing separately obtained history, performing a medically appropriate exam/evaluation, counseling and educating, and documenting clinical information in the EHR.  Harlene Bogaert, AGNP-BC  [facility] [address] [place], [place] [address]  Phone [phone] Fax [phone] Note: This document was prepared with digital dictation and possible smart phrase technology. Any transcriptional errors that result from this process are unintentional.   "

## 2024-11-18 NOTE — Progress Notes (Signed)
 Remote Loop Recorder Transmission

## 2024-12-07 ENCOUNTER — Encounter

## 2024-12-08 ENCOUNTER — Ambulatory Visit

## 2024-12-11 ENCOUNTER — Ambulatory Visit

## 2024-12-21 ENCOUNTER — Ambulatory Visit: Admitting: Cardiology

## 2025-01-07 ENCOUNTER — Encounter

## 2025-01-08 ENCOUNTER — Ambulatory Visit

## 2025-02-16 ENCOUNTER — Ambulatory Visit: Admitting: Cardiology
